# Patient Record
Sex: Male | Born: 1960
Health system: Southern US, Community
[De-identification: ages and names within clinical notes are randomized; demographics above are authoritative.]

## PROBLEM LIST (undated history)

## (undated) DIAGNOSIS — Z91158 Patient's noncompliance with renal dialysis for other reason: Secondary | ICD-10-CM

## (undated) DIAGNOSIS — F419 Anxiety disorder, unspecified: Secondary | ICD-10-CM

## (undated) DIAGNOSIS — Z5189 Encounter for other specified aftercare: Secondary | ICD-10-CM

## (undated) DIAGNOSIS — E119 Type 2 diabetes mellitus without complications: Secondary | ICD-10-CM

## (undated) DIAGNOSIS — N2581 Secondary hyperparathyroidism of renal origin: Secondary | ICD-10-CM

## (undated) DIAGNOSIS — N3941 Urge incontinence: Secondary | ICD-10-CM

## (undated) DIAGNOSIS — M109 Gout, unspecified: Secondary | ICD-10-CM

## (undated) DIAGNOSIS — N189 Chronic kidney disease, unspecified: Secondary | ICD-10-CM

## (undated) DIAGNOSIS — D689 Coagulation defect, unspecified: Secondary | ICD-10-CM

## (undated) DIAGNOSIS — N186 End stage renal disease: Secondary | ICD-10-CM

## (undated) DIAGNOSIS — F329 Major depressive disorder, single episode, unspecified: Secondary | ICD-10-CM

## (undated) DIAGNOSIS — E875 Hyperkalemia: Secondary | ICD-10-CM

## (undated) DIAGNOSIS — T8859XA Other complications of anesthesia, initial encounter: Secondary | ICD-10-CM

## (undated) DIAGNOSIS — T4145XA Adverse effect of unspecified anesthetic, initial encounter: Secondary | ICD-10-CM

## (undated) DIAGNOSIS — K219 Gastro-esophageal reflux disease without esophagitis: Secondary | ICD-10-CM

## (undated) DIAGNOSIS — C679 Malignant neoplasm of bladder, unspecified: Secondary | ICD-10-CM

## (undated) DIAGNOSIS — E785 Hyperlipidemia, unspecified: Secondary | ICD-10-CM

## (undated) DIAGNOSIS — D631 Anemia in chronic kidney disease: Secondary | ICD-10-CM

## (undated) DIAGNOSIS — R350 Frequency of micturition: Secondary | ICD-10-CM

## (undated) DIAGNOSIS — I10 Essential (primary) hypertension: Secondary | ICD-10-CM

## (undated) DIAGNOSIS — Z9115 Patient's noncompliance with renal dialysis: Secondary | ICD-10-CM

## (undated) DIAGNOSIS — D649 Anemia, unspecified: Secondary | ICD-10-CM

## (undated) DIAGNOSIS — F32A Depression, unspecified: Secondary | ICD-10-CM

## (undated) DIAGNOSIS — I5189 Other ill-defined heart diseases: Secondary | ICD-10-CM

## (undated) DIAGNOSIS — R351 Nocturia: Secondary | ICD-10-CM

## (undated) DIAGNOSIS — T7840XA Allergy, unspecified, initial encounter: Secondary | ICD-10-CM

## (undated) DIAGNOSIS — I129 Hypertensive chronic kidney disease with stage 1 through stage 4 chronic kidney disease, or unspecified chronic kidney disease: Secondary | ICD-10-CM

## (undated) HISTORY — DX: Allergy, unspecified, initial encounter: T78.40XA

## (undated) HISTORY — DX: End stage renal disease: N18.6

## (undated) HISTORY — DX: Coagulation defect, unspecified: D68.9

## (undated) HISTORY — DX: Hypertensive chronic kidney disease with stage 1 through stage 4 chronic kidney disease, or unspecified chronic kidney disease: I12.9

## (undated) HISTORY — PX: BACK SURGERY: SHX140

## (undated) HISTORY — PX: COLONOSCOPY: SHX174

## (undated) HISTORY — DX: Encounter for other specified aftercare: Z51.89

## (undated) HISTORY — DX: Secondary hyperparathyroidism of renal origin: N25.81

## (undated) HISTORY — DX: Chronic kidney disease, unspecified: N18.9

## (undated) HISTORY — DX: Hyperlipidemia, unspecified: E78.5

## (undated) HISTORY — DX: Hyperkalemia: E87.5

## (undated) HISTORY — DX: Anemia in chronic kidney disease: D63.1

---

## 1998-03-16 ENCOUNTER — Encounter: Payer: Self-pay | Admitting: Emergency Medicine

## 1998-03-16 ENCOUNTER — Emergency Department (HOSPITAL_COMMUNITY): Admission: EM | Admit: 1998-03-16 | Discharge: 1998-03-16 | Payer: Self-pay | Admitting: Emergency Medicine

## 2002-09-06 ENCOUNTER — Emergency Department (HOSPITAL_COMMUNITY): Admission: EM | Admit: 2002-09-06 | Discharge: 2002-09-06 | Payer: Self-pay | Admitting: Emergency Medicine

## 2002-09-06 ENCOUNTER — Encounter: Payer: Self-pay | Admitting: Emergency Medicine

## 2005-06-02 ENCOUNTER — Ambulatory Visit (HOSPITAL_COMMUNITY): Admission: RE | Admit: 2005-06-02 | Discharge: 2005-06-03 | Payer: Self-pay | Admitting: Neurosurgery

## 2005-06-02 HISTORY — PX: LUMBAR LAMINECTOMY: SHX95

## 2005-09-03 ENCOUNTER — Inpatient Hospital Stay (HOSPITAL_COMMUNITY): Admission: RE | Admit: 2005-09-03 | Discharge: 2005-09-08 | Payer: Self-pay | Admitting: Psychiatry

## 2005-09-09 ENCOUNTER — Other Ambulatory Visit (HOSPITAL_COMMUNITY): Admission: RE | Admit: 2005-09-09 | Discharge: 2005-09-14 | Payer: Self-pay | Admitting: Psychiatry

## 2005-09-09 ENCOUNTER — Ambulatory Visit: Payer: Self-pay | Admitting: Psychiatry

## 2005-10-28 ENCOUNTER — Emergency Department (HOSPITAL_COMMUNITY): Admission: EM | Admit: 2005-10-28 | Discharge: 2005-10-28 | Payer: Self-pay | Admitting: Emergency Medicine

## 2005-11-03 ENCOUNTER — Ambulatory Visit (HOSPITAL_COMMUNITY): Payer: Self-pay | Admitting: Psychiatry

## 2005-11-05 ENCOUNTER — Inpatient Hospital Stay (HOSPITAL_COMMUNITY): Admission: AD | Admit: 2005-11-05 | Discharge: 2005-11-10 | Payer: Self-pay | Admitting: Psychiatry

## 2005-11-05 ENCOUNTER — Ambulatory Visit: Payer: Self-pay | Admitting: Psychiatry

## 2005-11-17 ENCOUNTER — Ambulatory Visit (HOSPITAL_COMMUNITY): Payer: Self-pay | Admitting: Psychiatry

## 2005-12-20 ENCOUNTER — Ambulatory Visit (HOSPITAL_COMMUNITY): Payer: Self-pay | Admitting: Psychiatry

## 2006-03-02 ENCOUNTER — Ambulatory Visit (HOSPITAL_COMMUNITY): Payer: Self-pay | Admitting: Psychiatry

## 2006-05-11 ENCOUNTER — Ambulatory Visit (HOSPITAL_COMMUNITY): Payer: Self-pay | Admitting: Psychiatry

## 2006-11-28 ENCOUNTER — Ambulatory Visit (HOSPITAL_COMMUNITY): Payer: Self-pay | Admitting: Psychiatry

## 2007-03-20 ENCOUNTER — Ambulatory Visit (HOSPITAL_COMMUNITY): Payer: Self-pay | Admitting: Psychiatry

## 2007-04-06 HISTORY — PX: OTHER SURGICAL HISTORY: SHX169

## 2007-08-03 ENCOUNTER — Emergency Department (HOSPITAL_COMMUNITY): Admission: EM | Admit: 2007-08-03 | Discharge: 2007-08-03 | Payer: Self-pay | Admitting: Emergency Medicine

## 2007-08-04 ENCOUNTER — Inpatient Hospital Stay (HOSPITAL_COMMUNITY): Admission: RE | Admit: 2007-08-04 | Discharge: 2007-08-07 | Payer: Self-pay | Admitting: General Surgery

## 2007-08-04 ENCOUNTER — Ambulatory Visit: Payer: Self-pay | Admitting: Surgery

## 2007-10-13 ENCOUNTER — Ambulatory Visit (HOSPITAL_COMMUNITY): Payer: Self-pay | Admitting: Psychiatry

## 2008-02-14 ENCOUNTER — Ambulatory Visit (HOSPITAL_COMMUNITY): Payer: Self-pay | Admitting: Psychiatry

## 2008-02-26 ENCOUNTER — Ambulatory Visit (HOSPITAL_COMMUNITY): Payer: Self-pay | Admitting: Psychiatry

## 2008-04-22 ENCOUNTER — Ambulatory Visit (HOSPITAL_COMMUNITY): Payer: Self-pay | Admitting: Psychiatry

## 2008-12-02 ENCOUNTER — Ambulatory Visit (HOSPITAL_COMMUNITY): Payer: Self-pay | Admitting: Psychiatry

## 2009-10-27 ENCOUNTER — Ambulatory Visit (HOSPITAL_COMMUNITY): Admission: RE | Admit: 2009-10-27 | Discharge: 2009-10-27 | Payer: Self-pay | Admitting: Psychiatry

## 2009-10-28 ENCOUNTER — Ambulatory Visit: Payer: Self-pay | Admitting: Psychiatry

## 2009-10-28 ENCOUNTER — Inpatient Hospital Stay (HOSPITAL_COMMUNITY): Admission: AD | Admit: 2009-10-28 | Discharge: 2009-10-30 | Payer: Self-pay | Admitting: Psychiatry

## 2010-06-20 LAB — URINALYSIS, ROUTINE W REFLEX MICROSCOPIC
Glucose, UA: 250 mg/dL — AB
Leukocytes, UA: NEGATIVE
Nitrite: NEGATIVE
Specific Gravity, Urine: 1.022 (ref 1.005–1.030)
pH: 5.5 (ref 5.0–8.0)

## 2010-06-20 LAB — URINE MICROSCOPIC-ADD ON

## 2010-06-20 LAB — CBC
HCT: 45.9 % (ref 39.0–52.0)
MCHC: 35.2 g/dL (ref 30.0–36.0)
Platelets: 264 10*3/uL (ref 150–400)
RDW: 13 % (ref 11.5–15.5)
WBC: 9.6 10*3/uL (ref 4.0–10.5)

## 2010-06-20 LAB — LIPID PANEL
HDL: 37 mg/dL — ABNORMAL LOW (ref >39)
LDL Cholesterol: 71 mg/dL (ref 0–99)
Triglycerides: 289 mg/dL — ABNORMAL HIGH (ref <150)
VLDL: 58 mg/dL — ABNORMAL HIGH (ref 0–40)

## 2010-06-20 LAB — COMPREHENSIVE METABOLIC PANEL
ALT: 43 U/L (ref 0–53)
Albumin: 4.3 g/dL (ref 3.5–5.2)
Calcium: 10.2 mg/dL (ref 8.4–10.5)
Glucose, Bld: 173 mg/dL — ABNORMAL HIGH (ref 70–99)
Potassium: 3.6 mEq/L (ref 3.5–5.1)
Sodium: 141 mEq/L (ref 135–145)
Total Protein: 7.5 g/dL (ref 6.0–8.3)

## 2010-06-20 LAB — GLUCOSE, RANDOM: Glucose, Bld: 131 mg/dL — ABNORMAL HIGH (ref 70–99)

## 2010-06-20 LAB — URINE DRUGS OF ABUSE SCREEN W ALC, ROUTINE (REF LAB)
Barbiturate Quant, Ur: NEGATIVE
Creatinine,U: 205.2 mg/dL
Ethyl Alcohol: <10 mg/dL (ref <10)
Marijuana Metabolite: NEGATIVE
Methadone: NEGATIVE
Phencyclidine (PCP): NEGATIVE

## 2010-06-20 LAB — TSH: TSH: 3.837 u[IU]/mL (ref 0.350–4.500)

## 2010-08-18 NOTE — Discharge Summary (Signed)
[name] [name], [name] [name]             ACCOUNT NO.:  [id number]   MEDICAL RECORD NO.:  [id number]          PATIENT TYPE:  INP   LOCATION:  3305                         FACILITY:  [facility]   PHYSICIAN:  Dennie Bible, MD    DATE OF BIRTH:  March 13, 1961   DATE OF ADMISSION:  08/04/2007  DATE OF DISCHARGE:  08/07/2007                               DISCHARGE SUMMARY   ADMITTING DIAGNOSIS:  Ischemic  right hand, ischemic small finger, long  finger, and ring finger.   DISCHARGE DIAGNOSIS:  Ischemic right hand, ischemic small finger, long  finger, and ring finger.   HOSPITAL COURSE:  The patient was admitted on Aug 04, 2007, with the  above-mentioned diagnosis, after which he had obtained an arteriogram  showing thrombosis of the ulnar artery.  He was started on IV heparin  therapy with the plan to take him to the operating room on the following  morning on Aug 05, 2007, for exploration of the ulnar artery.  The risks,  benefits and alternatives of surgery were discussed with the patient,  and the patient's family and they agreed and consented to this plan of  care.  Please see the operative report for full details.  In essence,  the patient underwent exploration of the ulnar artery and  revascularization of the right ulnar artery.  Postoperatively, he was  transferred to the intensive unit for frequent neurovascular checks due  to administration of thrombolytics and the nature of his long operation.  His heparin was made therapeutic and he was stable overnight.  On the  first postoperative day, the patient stated that his hand felt better.  There were still some ischemic-appearing fingers: the ring finger tip as  well as the small finger were slightly cooler than the others, however,  subjectively, the patient stated that his hand felt better.  He was  continued on heparin and given aspirin therapy.  On the day of  discharge, the patient's vital signs remained stable including his lab  work.  He  was in no pain.  Examination  of the hand revealed a faint  Doppler signal to all fingers, the ring finger tip appeared to be  somewhat less ischemic.  The wound was not assessed at this time.   DISCHARGE AND PLANNING:  The patient will be educated on the home  Lovenox therapy and will be started on Coumadin for approximately 3  months duration.  He is given a note for work for nonweightbearing and  one handed duty the left hand only.   DISCHARGE MEDICATIONS:  Coumadin.  He is to take 10 mg of Coumadin  tonight, 10 mg of Coumadin tomorrow night and then 5 mg.  His primary  care physician who is Dr. Florina Ou, has been contacted and the  patient will follow up with her for Coumadin therapy.  The patient will  follow up with me in 1 week's time.      Dennie Bible, MD  Electronically Signed     HCC/MEDQ  D:  08/07/2007  T:  08/08/2007  Job:  [id number]

## 2010-08-18 NOTE — Op Note (Signed)
[name] [name], [name] [name]             ACCOUNT NO.:  [id number]   MEDICAL RECORD NO.:  [id number]          PATIENT TYPE:  INP   LOCATION:  3305                         FACILITY:  [facility]   PHYSICIAN:  Theotis Burrow IV, MDDATE OF BIRTH:  Jul 08, 1960   DATE OF PROCEDURE:  08/05/2007  DATE OF DISCHARGE:                               OPERATIVE REPORT   PREOP DIAGNOSIS:  Right hand ischemia.   POSTOP DIAGNOSIS:  Right hand ischemia.   PROCEDURES PERFORMED:  1. Right ulnar artery thrombectomy.  2. Administration of t-PA.   ANESTHESIA:  General.   INDICATIONS:  This is a 50 year old gentleman who suffered ulnar artery  occlusion traumatically during work.  Dr. Lenon Curt has brought the patient  to the operating room for bypass and has performed bypass from his  distal ulnar artery out through a digital vessel with vein.  The bypass  had to be re-done given that there was a poor flow likely due to poor  outflow in the digital arteries.  He has asked me to come in and assist.  Please see his note for the full details of the procedure.  The  following is the dictation of aspect of the procedure.   Upon my arrival, the patient's distal ulnar artery and a digital artery  were already exposed.  There was an interposition vein graft already  present.  This was evaluated with the Doppler, there did not appear to  be any flow within the bypass graft.  At this point, the patient  previously undergone an arteriogram and this arteriotomy had been closed  with Prolene suture.  This was removed, #2 Fogarty was passed retrograde  and thrombus was evacuated.  With evacuation of thrombus there was  excellent flow through the ulnar artery.  At this point, the patient was  systemically heparinized.  The ulnar artery was re-occluded.  We then  passed a #2 Fogarty across both anastomoses of the interposition graft  and performed thrombectomy, however, no thrombus was evacuated.  Through  this arteriotomy, I  elected to inject 2 mg of t-PA.  The artery was then  occluded and the arteriotomy was repaired with a 7-0 U-stitch.  Once  this was repaired, the vascular clamps were removed off the ulnar  artery.  We were able to hear good Doppler signal distal to the distal-  anastomosis.  At this time based on what had already happened in his  operation and the fact that we now had flow within the digital arteries,  so I did not feel further surgery would be of benefit.           ______________________________  V. Leia Alf, MD  Electronically Signed     VWB/MEDQ  D:  08/06/2007  T:  08/06/2007  Job:  [id number]

## 2010-08-18 NOTE — Consult Note (Signed)
[name] [name], [name] [name]             ACCOUNT NO.:  [id number]   MEDICAL RECORD NO.:  [id number]          PATIENT TYPE:  INP   LOCATION:  3305                         FACILITY:  [facility]   PHYSICIAN:  Theotis Burrow IV, MDDATE OF BIRTH:  [birth year]   DATE OF CONSULTATION:  DATE OF DISCHARGE:                                 CONSULTATION   CONSULTING PHYSICIAN:  Dennie Bible, MD.   REASON FOR CONSULT:  Right hand ischemia.   HISTORY:  This is a 50 year old gentleman seen at the request of  Dr.  Lenon Curt for evaluation of right hand ischemia.  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  He used his hand to shut off the machine and  shortly thereafter began having numbness and pain in his hand.  He was  seen earlier in the office by Dr. Lenon Curt and at that time was complaining  of numbness, particularly at the distal tip of his fourth finger.  He  also had sensation changes on his third and fifth finger.  He was able  to move his hand.  He was seen in Dr. Brennan Bailey clinic and sent for an  arteriogram.  Arteriogram reveals occlusion of his ulnar artery at the  wrist.  The patient has an incomplete palmar arch with minimal filling  of the digital vessels going to his third, fourth, and fifth fingers.   PAST MEDICAL HISTORY:  Significant for depression and back surgery.   MEDICATIONS:  Include Celexa.   ALLERGIES:  None.   FAMILY HISTORY:  Noncontributory.   REVIEW OF SYSTEMS:  Negative for chest pain or shortness of breath.  Negative for fevers, chills, nausea, or vomiting.  No weight gain or  weight loss.   PHYSICAL EXAMINATION:  VITAL SIGNS:  The patient is afebrile,  hemodynamically stable.  GENERAL:  He is well appearing in no acute distress.  Normocephalic,  atraumatic.  CARDIOVASCULAR:  Regular rate.  LUNGS:  Respirations are nonlabored.  EXTREMITIES:  Exam of the right hand reveals palpable radial pulses.   No  pulse palpable in the distal ulnar artery.  The patient has motor  function of all five digits.  Sensation is diminished on the lateral  three fingers, and there is a bluish discoloration at the tip of his  fourth finger.   ASSESSMENT:  This is a 50 year old with a traumatic injury to the right  ulnar artery and ischemic changes due to occlusion.   PLAN:  Based on the patient's arteriogram, I feel that he has a very  poor outflow and that surgical intervention does not have a high  likelihood of success.  The patient would need bypass from the digital  ulnar artery out onto the digital artery in the hand assuming that we  could improve the outflow to the digital arteries.  At this point, given  that the patient is several days out from his injury, I would recommend  a trial of heparinization to see how the patient  does overnight with reevaluation in the morning.  I  explained to the  patient that he is potentially at risk for losing his fingers.  I  discussed this with the patient and his wife, he understands this.  We  will continue to see the patient in consultation.           ______________________________  V. Leia Alf, MD  Electronically Signed     VWB/MEDQ  D:  08/06/2007  T:  08/06/2007  Job:  [id number]

## 2010-08-18 NOTE — Op Note (Signed)
NAMEDEVRY, SEGAR             ACCOUNT NO.:  [id number]   MEDICAL RECORD NO.:  [id number]          PATIENT TYPE:  INP   LOCATION:  3305                         FACILITY:  [facility]   PHYSICIAN:  Dennie Bible, MD    DATE OF BIRTH:  [birth year]   DATE OF PROCEDURE:  08/05/2007  DATE OF DISCHARGE:  08/07/2007                               OPERATIVE REPORT   PREOPERATIVE DIAGNOSIS:  Ischemic right hand, right small finger, right  ring finger, and right middle finger.   POSTOPERATIVE DIAGNOSIS:  Ischemic right hand, right small finger, right  ring finger, and right middle finger.   PROCEDURES:  1. Exploration of the ulnar artery and the superficial palmar arch of      the right hand.  2. Thrombectomy of the right ulnar artery and superficial palmar arch.  3. Interposition reversed vein graft from the ulnar artery to the      palmar arch.  4. Intraoperative arteriogram.  5. Thrombolysis with tPA.  6. Complex wound closure.   SURGEON:  Harrill C. Lenon Curt, M.D.   INTRAOPERATIVE CONSULT:  Theotis Burrow IV, MD.   ANESTHESIA:  General.   FINDINGS:  Thickened-diseased ulnar artery in the palm, positive clot.  Good doppler signal distally post procedure.   SPECIMENS:  Portion of the artery that was resected was sent to  pathology for identification.   ESTIMATED BLOOD LOSS:  25 mL.   COMPLICATIONS:  Poor outflow after revascularization.   DISPOSITION:  To PACU in good condition.   INDICATIONS:  Mr. Kool is a 50 year old gentleman who states that on  Wednesday, 3 days prior to the surgery, at work the patient used the  palm of his hand to manipulate a machine.  Afterwards, his hand and his  fingers became painful and discolored.  The patient was seen briefly in  the emergency department on Thursday night and the patient saw me in the  office Friday morning, at which time I felt that he had ischemic small,  ring and long fingers.  The patient was sent for an arteriogram,  which  revealed thrombosis of the ulnar artery and poor-defined palmar arch  with essentially minimal flow to the long, ring and small fingers.  The  patient was counseled on these findings and he was placed on therapeutic  heparin overnight and scheduled for ulnar artery exploration in the  morning.  On heparin therapy, the patients symptoms improved.  The  risks, benefits, and alternatives of the surgery were discussed with the  patient including loss of the fingers.  He agreed to proceed with  surgery.  Consent was obtained.   PROCEDURE:  The patient was taken to the operating room.  General  anesthesia was administered.  The right upper extremity was prepped and  draped in normal sterile fashion.  The tourniquet was used on the upper  arm, an Esmarch was used from the wrist proximally and not on the hand.  Tourniquet was inflated to 250 mmHg.  An incision was made overlying the  proximal ulnar artery and a  zigzag incision was fashioned over the  wrist  crease and onto the hypothenar eminence.  The ulnar artery was  located proximally and followed distally until the initial portions of  the superficial palmar arch and the common digital branches were  visualized.  It was evident that the artery appeared to be abnormal and  thickened in the mid-palm portion.  Vessel loops were placed around the  proximal and distal artery, the artery was isolated.  Following, a stab  incision was made in the artery and a 2-mm Fogarty balloon was passed  proximally and then distally obtaining a small portion of clot.  Inflow  was then accessed and it was very good.  A clamp was placed over the  ulnar proximal artery.  Distally, there was also very good backflow.  The caliber of the common digital vessels were such that the Fogarty  balloon could not be passed.  The 68mm Fogarty catheter was advanced  approximately 3 cm distal to the diseased segment and well into the  superficial palmar arch.  Vascular  clamps were placed and the diseased  portion of the ulnar artery was resected (~3cm).  Both new ends appeared  to be in satisfactorily nondiseased state.  There were no major branches  in the resected portion.  The resection continued distally up to what I  believe was the common digital branch to the small and ring fingers.  This branch was preserved.  Both vessels were irrigated with Stephens Shire  solution and vascular clamps placed.  The hand was then turned and an  approximate 3-cm incision was made on the radial side of the wrist to  locate at appropriate sized vein for reanastomosis.  Dissection down to  the subcutaneous tissue was performed.  The superficial radial branch  nerve was identified and preserved.  A branch of what appeared to be  cephalic vein was encountered and an appropriate length portion was  isolated, tied proximally with a 3-0 silk suture.  Individual branches  were also tied with 3-0 silk sutures and distally the vein was tied off  with 3-0 silk suture.  The vein was marked for proper orientation.  Following, the hand was repositioned and the vein was anastomosed  connecting the 2 segments of ulnar artery.  This vein was in a reversed  orientation and was anastomosed in an interrupted fashion with 8-0 nylon  sutures.  Afterwards, the distal clamp was released to allow some  backbleeding followed by proximal release of the clamp.  The vessel at  first showed good flow and there was a nice pulsatile flow through the  graft and distally.  There was 1 small area on the proximal graft that  required an additional stitch.  The graft was observed for several  minutes.  Next, an intraoperative arteriogram was performed with a  butterfly needle.  The proximal ulnar artery was accessed and then the  arteriogram was performed.  The arteriogram revealed nonfilling of the  graft.  Afterwards, the graft was taken down, the graft was confirmed  again to be reversed  and it was  reanastomosed both proximally and  distally in a interrupted fashion with the 8-0 nylon sutures.  Again,  there was nice pulsatile and visible pulsation of the graft.  Intraoperative Doppler of the graft revealed almost a water-hammer type  single.  At this point, the decision was made to consult Dr. Trula Slade  for his opinions for additional reconstruction or additional therapy.  Dr. Trula Slade came to the room and an additional thrombectomy was  performed.  There was a small amount of clot proximally.  No clot was  obtained distally.  2 mg of diluted tPA was placed distally to try to  improve out flow.  At this time, it was thought that we had good inflow  and poor outflow due to thrombosis of some of the small-caliber common  digital vessels.  Doppler signal revealed a good strong signal  proximally; and at the proximal portion of the palmar arch, distal to  the vein graft, there was good triphasic flow.  Hemostasis was then  obtained.  A small piece of a Surgicel was placed adjacent to the  proximal anastomosis.  The wound was then irrigated thoroughly with  irrigation solution.  Because of the length of procedure and swelling  involved, some of the skin underlying the incision was manipulated to  cover the bypass without undue tension.  This was closed in a Z-plasty  type fashion in 2 layers.  The first layer being several interrupted 4-0  Vicryl sutures followed by both horizontal mattress and interrupted 5-0  nylon sutures.  A sterile dressing and a volar splint were placed for  comfort.  At the conclusion of the  case, the distal portion of the ring finger remained ischemic, as it was  prior to the procedure.  There was a Doppler signal through at least 1  of the digital vessels to the long finger and the small finger.  The  patient will be placed on IV heparin therapy and monitored in the unit  for neurovascular checks.      Dennie Bible, MD  Electronically Signed      HCC/MEDQ  D:  08/07/2007  T:  08/08/2007  Job:  [id number]

## 2010-08-21 NOTE — H&P (Signed)
[name] [name], [name] [name]             ACCOUNT NO.:  [id number]   MEDICAL RECORD NO.:  [id number]          PATIENT TYPE:  IPS   LOCATION:  0307                          FACILITY:  [facility]   PHYSICIAN:  Carlton Adam, M.D.      DATE OF BIRTH:  30-May-1960   DATE OF ADMISSION:  11/05/2005  DATE OF DISCHARGE:                         PSYCHIATRIC ADMISSION ASSESSMENT   The patient reports that he took an overdose of Ambien and lorazepam and  went into the woods to die. Apparently his wife called EMS that an ED visit  was needed but the wife brought the patient here as a voluntary admission.  The patient is mad at his wife and is not listing her for visits. He  presented with a flat affect, his mood was depressed. He was unable to  verbalize why or what triggered the overdose. According to hospital records,  he was cleared in the emergency department on July 26th.  He supposedly had  overdosed then. He had a urine drug screen that was negative, and he had a  CT scan of the head that was negative.  He has one prior admission here,  June 1st to June 6th, 2007 and apparently he claims that all of his symptoms  began after he had back surgery.  His back surgery was February 28th and  since that time he has reported feeling depressed, having occasional  thoughts of wanting to hurt himself. He is complaining of losing weight. In  the system he was weighed here at 187, today is 176 so he does not have a  remarkable weight loss.   Today he states that he took an overdose yesterday around 8 a.m., I took  the rest of my sleeping pills.  He was unable to specify why. He went into  the woods.  His wife called the police who searched with dogs. They then  took him to go for [facility]. EMS evaluated him on the scene. No  ER visit was needed, and in fact, [facility] sent the  patient home.  The patient then presented here for further adjustments of  his medications.   SOCIAL HISTORY:   He had some classes at [employer]. 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 This is his second marriage. He  has been in this marriage 70 years. He has three daughters ages 45, 45 and  54.   FAMILY HISTORY:  None.   ALCOHOL AND DRUGS HISTORY:  None.   PRIMARY CARE PHYSICIAN:  Dr. Florina Ou.   MEDICAL PROBLEMS:  He is status post back surgery on June 02, 2005.   MEDICATIONS:  1.  He is currently prescribed Wellbutrin 100 mg b.i.d.  2.  Lorazepam 0.5 mg p.r.n.  3.  Celexa 10 mg p.o. daily.  4.  Ambien 10 mg at h.s.  5.  Testosterone.  6.  AndroGel.  7.  He started he was also recently given a sleep aid sample that started      with an R, must be Rozerem.  ALLERGIES:  No known drug allergies.   PHYSICAL EXAMINATION:  Unremarkable.   LABS:  His labs are pending. He did have a CT scan of the head October 28, 2005  that was negative.   I administered a new mini mental status exam to him today that is called the  SLUMS and he came up with dementia to neuro cognitive disorder as his score.  It is hard to know if he just has severe depression or something else going  on. He is very odd in his presentation.   MENTAL STATUS EXAM:  He is alert. He is casually groomed and dressed. He is  adequately nourished. His speech is slow, soft, his mood is depressed, his  affect is flat. His thought processes are not clear or rational. Judgment  and insight are poor. Concentration is okay but his memory is spotty. He  denies being suicidal or homicidal. He denies having auditory or visual  hallucinations.  He states, I lost my confidence, I am always checking  myself, everything is racing through my mind when I try to go to bed at  night.   Axis I:  Major depressive disorder, single episode, severe without  psychosis.  Axis II:  Deferred.  Axis III:  No known medical illnesses.  Axis IV:  Mild occupational, marital and stressors.  Axis V:  Is 30.   PLAN:   Admit for safety and stabilization.  Adjust his medications toward that end, let us try giving him some Zyprexa,  Zydis to help with sleep and decrease racing thoughts and help him not want  to be checking all the time.      Mickie Kerry Dory, P.A.-C.      Carlton Adam, M.D.     MD/MEDQ  D:  11/06/2005  T:  11/06/2005  Job:  [id number]

## 2010-08-21 NOTE — Discharge Summary (Signed)
NAMESEARLE, CASTORINA NO.:  [id number]   MEDICAL RECORD NO.:  [id number]          PATIENT TYPE:  IPS   LOCATION:  0307                          FACILITY:  [facility]   PHYSICIAN:  Carlton Adam, M.D.      DATE OF BIRTH:  June 01, 1960   DATE OF ADMISSION:  11/05/2005  DATE OF DISCHARGE:  11/10/2005                                 DISCHARGE SUMMARY   CHIEF COMPLAINT AND PRESENT ILLNESS:  This was the first admission to [facility] for this 50 year old male who reportedly overdosed on  Ambien and lorazepam and went into the woods to die.  His wife called EMS.  The patient was mad at his wife, not listing her for visits.  Presented with  a flat affect, depressed mood, unable to verbalize why or what triggered the  overdose.  He was cleared in the emergency department on October 28, 2005.  At  that time, UDS was negative for substances of abuse.  CT scan was negative.   PAST PSYCHIATRIC HISTORY:  He was admitted September 03, 2005 to September 08, 2005.  He  claimed his symptoms started after he had back surgery.  Since then, feeling  depressed, occasional thoughts of wanting to hurt himself.   ALCOHOL/DRUG HISTORY:  Denies active use of any substances.   MEDICAL HISTORY:  Noncontributory except status post back surgery June 02, 2005.   MEDICATIONS:  Wellbutrin 100 mg twice a day, Ativan 0.5 mg as needed, Celexa  10 mg per day, Ambien 10 mg at night, testosterone AndroGel.  More recently  was given Rozerem for sleep.   PHYSICAL EXAMINATION:  Performed and failed to show any acute findings.   LABORATORY DATA:  Not available in the chart.   MENTAL STATUS EXAM:  Alert male.  Casually groomed and dressed.  Speech was  slow, soft.  His mood was depressed.  Affect was constricted.  Thought  processes are not clear or rational.  Judgment and insight are poor.  Concentration is preserved but memories spotty.  Denied any suicidal or  homicidal ideation.  No evidence of  delusions.  There is no evidence of  hallucinations.  Cognition was well-preserved.   ADMISSION DIAGNOSES:  AXIS I:  Major depressive disorder.  AXIS II:  No diagnosis.  AXIS III:  No diagnosis.  AXIS IV:  Moderate.  AXIS V:  GAF upon admission 30; highest GAF in the last year 51.   HOSPITAL COURSE:  He was admitted.  He was started in individual and group  psychotherapy.  He was given Ativan 1 mg as needed for anxiety.  Then, he  was switched to Klonopin and he was maintained on Celexa that was increased  up to 30 mg.  He endorsed that he guessed his medications were not working  well.  Had an appointment with Dr. Adele Schilder that had been rescheduled.  He  took 6 Ambien and his antidepressant.  Went into the woods to die.  Depressed for the past several weeks.  Sleep disturbance, decreased  appetite, losing 40 pounds in three months.  There was  a family session.  He  was very depressed, barely spoke.  We continued to work with the  medications.  He endorsed psychomotor retardation, anhedonia, feeling slowed  down, poor concentration at work which was impairing his performance.  Episodes of amnesia after taking his Ambien.  He was started on Zyprexa.  He  slept.  The next morning, he seemed to have a little brighter affect,  feeling less anxious.  He endorsed that he did continue to feel very  depressed, no energy, no motivation.  He endorsed that he was still dealing  with the accident where his brother died.  He confessed to his wife what  happened and she in turn shared it with his mother and he got upset because  of that.  Did not want the mother to know but he was able to deal with these  feelings.  Endorsed that he could not explain why he was feeling this way.  The Zyprexa helped him to sleep and also seemed to help with the anxiety.  He endorsed that he worries a lot about his job.  Wife stated that he is  perfectionistic, constantly worrying.  On November 09, 2005, he was sleeping   better, decrease in the ruminations and the worries.  Objectively, his  affect was broader.  We pursued the Celexa, the Zyprexa, coping skills,  grief and loss.  On November 10, 2005, he was objectively better.  Sleeping  through the night.  He was of brighter affect.  Endorsed no suicidal  ideation.  No homicidal ideation.  No hallucinations.  No delusions.  Endorsed that he was feeling the best he has felt in a long time.   DISCHARGE DIAGNOSES:  AXIS I:  Major depression, recurrent.  Anxiety  disorder not otherwise specified.  AXIS II:  No diagnosis.  AXIS III:  Status post back surgery.  AXIS IV:  Moderate.  AXIS V:  GAF upon discharge 55-60.   DISCHARGE MEDICATIONS:  1. Zyprexa Zydis 10 mg at bedtime.  2. Klonopin 0.5 mg twice a day.  3. Celexa 20 mg, 1-1/2 daily.   FOLLOWUP:  Dr. Adele Schilder and [facility].      Carlton Adam, M.D.  Electronically Signed     IL/MEDQ  D:  11/23/2005  T:  11/24/2005  Job:  [id number]

## 2010-08-21 NOTE — Discharge Summary (Signed)
NAMEODINN, BEVIER             ACCOUNT NO.:  [id number]   MEDICAL RECORD NO.:  [id number]          PATIENT TYPE:  IPS   LOCATION:  0304                          [facility]ACILITY[facility]  [facility]   PHYSICIAN:  Carlton Adam, M.D.      DATE OF BIRTH:  1960-07-18   DATE OF ADMISSION:  09/03/2005  DATE OF DISCHARGE:  09/08/2005                                 DISCHARGE SUMMARY   CHIEF COMPLAINT AND PRESENTING ILLNESS:  This was the first admission to  [facility]  for this 50 year old married white male,  presented as a walk-in, feeling that everything was closing in on him,  having a hard time focusing.  Had been feeling unwell after his back surgery  June 02, 2005.  Had been losing weight as he has been able to work until  the last week.  He did have some thoughts of hurting himself by taking some  pills.  After he realized that he was getting depressed, he called the  surgeon who started Cymbalta.  He took that for 3 or 4 days.  He felt it was  too strong.  His primary care physician started him on Wellbutrin.  He took  7.  He said it woke him up but then he could not sleep at night.  Then he  was switched to Celexa 10 every morning.  He felt sedated, worried about his  job.  Had been employed there for 15 years despite having had back surgery.   PAST PSYCHIATRIC HISTORY:  No prior inpatient or outpatient treatment.  Has  been prescribed by his primary care physician.   ALCOHOL AND DRUG HISTORY:  Denies the active use of any substances.   PAST MEDICAL HISTORY:  Hyperlipidemia, decreased testosterone, back surgery  June 02, 2005/   MEDICATIONS:  Celexa 10 mg per day, testosterone AndroGel that he applies  daily.   PHYSICAL EXAMINATION:  Performed, failed to show any acute findings.   LABORATORY WORKUP:  CBC:  White blood cells 10.8, hemoglobin 15.9.  Blood  chemistries:  Sodium 140, potassium 3.7, glucose 87, creatinine 1.3, BUN 15.  Liver enzymes:  SGOT 17, SGPT 20.  TSH  2.892.  PSH .43.   MENTAL STATUS EXAM:  Reveals an alert cooperative male, appropriately  groomed, dressed and nourished.  Speech was a little bit slower.  Mood was  depressed, affect was decreased in range.  Thought processes were clear,  rational and goal oriented.  Judgment and insight were intact.  Denied any  active suicidal or homicidal ideas.   ADMISSION DIAGNOSES:  AXIS I:  Major depression, recurrent.  AXIS II:  No diagnosis.  AXIS III:  Urinary tract infection.  AXIS IV:  Moderate.  AXIS V:  Upon admission 30, highest global assessment of functioning in the  last year 63-70.   COURSE IN HOSPITAL:  He was admitted and started in individual and group  psychotherapy, was given Ambien for sleep, was maintained on Celexa that was  changed to 20 mg per day and he was further evaluated for a urinary tract  infection.  He was started  on Wellbutrin SL 100 in the morning.  He felt  upset because he could not do anything secondary to the pain.  Wellbutrin  and Cymbalta were helpful but not it long enough.  Feeling sedated on the  Celexa.  Still some suicidal ideations.  Withdrawn and isolated.  The  patient's wife shared that the patient's father died in an accident when he  was 59 years old and the patient's younger brother died in a tractor accident  when the patient was 72.  He recently expressed guilt to his wife about his  brother's death but would not elaborate.  The wife endorsed that in the last  18 years they have been married he has never talked about this.  Wife was  able to share how she has seen a big change with a lot of isolation and  anhedonia.  He did endorse a lot of stress after surgery, chronic pain, a  sense of being overwhelmed, sadness, decrease in energy, decreased  motivation, decreased concentration, thinking he stays out of work.  Good  worker all these years.  Continued to discuss options.  He was willing to  give Wellbutrin another try.  Continued to endorse  the depression, a sense  of being overwhelmed.  He became upset when his brother-in-law saw him in  the unit.  He did not want to be seen like that.  Up until that point he  said he was feeling a little better.  In the next 24 hours he continued to  work with himself.  He was open in group and individual sessions.  He wanted  to be discharged.  He felt the way he was feeling he would recover faster by  being at home.  So on June 6 he was in full contact with reality, endorsed  no active suicidal or homicidal ideas.  There was no evidence of  hallucinations or delusions.  Endorsed he was feeling much better.  Objectively he looked better, looking forward to getting out of the  hospital, being back home.  Endorsed support from the family, was willing to  come to IOP.   DISCHARGE DIAGNOSES:  AXIS I:  Major depression, recurrent, rule out post-  traumatic stress disorder.  AXIS II:  No diagnosis.  AXIS III:  Status post back surgery.  AXIS IV:  Moderate.  AXIS V:  Upon discharge 50-55.   DISCHARGE MEDICATIONS:  1.  Testosterone AndroGel cream, apply daily.  2.  Celexa 20 mg per day.  3.  Wellbutrin SL 100 mg twice a day.  4.  Ativan 0.5 twice a day as needed for anxiety.  5.  Ambien 10 at bedtime for sleep.   DISPOSITION:  Follow up IOP and Dr. Adele Schilder at [facility]  outpatient clinic.      Carlton Adam, M.D.  Electronically Signed     IL/MEDQ  D:  09/22/2005  T:  09/23/2005  Job:  [id number]

## 2010-08-21 NOTE — H&P (Signed)
[name] [name], [name] [name]             ACCOUNT NO.:  [id number]   MEDICAL RECORD NO.:  [id number]          PATIENT TYPE:  IPS   LOCATION:  0304                          FACILITY:  [facility]   PHYSICIAN:  [name] [name], M.D.      DATE OF BIRTH:  04-Jul-1960   DATE OF ADMISSION:  09/03/2005  DATE OF DISCHARGE:                         PSYCHIATRIC ADMISSION ASSESSMENT   This is a voluntary admission to the services of Dr. Carlton [name].   IDENTIFYING INFORMATION:  This is a 49 year old married white man.  He  presented as a walk-in yesterday.  He stated, It is seems like everything  is closing in on me.  I'm having a hard time focusing.  He reports that he  began feeling unwell after his back surgery, which was June 02, 2005.  Recently, he has been losing weight, and he has not been able to work this  past week.  Apparently, he did have some thoughts to hurt himself by taking  some pills.  He reports that after he realized he was getting depressed, he  called the surgeon who started Cymbalta.  He states that he took that for  three or four days, however, It was too strong for him.  He felt way out  there.  His primary care physician, Dr. Florina Ou, started him on  Wellbutrin.  He had a 7-day pack, 150 mg, which he was taking in the  mornings.  He said that woke him up; he had been sleeping too much.  However, then he could not sleep at night, so he only took that for seven  days.  He states that this past Tuesday, he began taking Celexa 10 mg p.o.  every morning, and that when he is sitting around, he nods off.  He also  states that he is worried about his job.  He has been employed there for 15  years, and despite having had back surgery, he is still having occasional  back pain.   PAST PSYCHIATRIC HISTORY:  He has no prior inpatient or outpatient care.  In  1984 or 1985, he saw a marriage counselor at the [place] of his first  marriage.   SOCIAL HISTORY:  He has an associate's degree.  He  works in Immunologist.  He  has been married twice, this time for 18 years.  He has three daughters, the  oldest is 77 and the younger ones are 25 and 55.   FAMILY HISTORY:  He denies.   ALCOHOL AND DRUG HISTORY:  He denies.   PRIMARY CARE [name]:  Dr. Florina Ou.   MEDICAL PROBLEMS:  Hyperlipidemia and decreased testosterone.  He has also  recently had back surgery, June 02, 2005.   CURRENT MEDICATIONS:  __________  cream that he applies daily, and the  Celexa 10 mg p.o. daily.   DRUG ALLERGIES:  No known drug allergies.   PHYSICAL EXAMINATION:  Physical exam is unremarkable.  He does have a lumbar  scar.  His vital signs show he is 5 feet 10 inches, weighs 187.  Temperature  is 97.3.  Blood pressure is 130/84.  Pulse is 60.  Respirations are 16.   His blood work shows that his urine is cloudy.  He had 7-10 WBCs.  He had a  moderate amount of leukocytes, and he had a large amount of blood in his  urine.  We will get that checked and get some medication started for that.   MENTAL STATUS EXAMINATION:  He is alert and oriented.  He is appropriately  groomed, dressed, and nourished.  His gait and motor are a little bit  slower.  His speech is a little bit slower.  His mood is depressed.  His  affect has a decreased range.  His thought process is clear, rational, and  goal-oriented.  His judgment and insight are intact.  Concentration and  memory are intact.  Today, he denies suicidal or homicidal ideation.  He  says, I have a family, and he denies ever having had auditory or visual  hallucinations.   DIAGNOSES:  AXIS I:  Major depressive disorder, recurrent, without psychotic  symptoms.  AXIS II:  Deferred.  AXIS III:  Urinary tract infection.  AXIS IV:  Severe.  AXIS V:  30.   PLAN:  Adjust his meds as indicated.  Toward that end, we will move the  Celexa to p.m. dosing and see if that does not help with his daytime  sleepiness, and we will treat his UTI.       Mickie Kerry Dory, P.A.-C.      [name] [name], M.D.  Electronically Signed    MD/MEDQ  D:  09/04/2005  T:  09/05/2005  Job:  [id number]

## 2010-08-21 NOTE — Op Note (Signed)
[name] [name], [name] [name]             ACCOUNT NO.:  [id number]   MEDICAL RECORD NO.:  [id number]          PATIENT TYPE:  AMB   LOCATION:  SDS                          FACILITY:  [facility]   PHYSICIAN:  Kary Kos, M.D.        DATE OF BIRTH:  May 06, 1960   DATE OF PROCEDURE:  DATE OF DISCHARGE:                                 OPERATIVE REPORT   PREOPERATIVE DIAGNOSIS:  L4 and L5 radiculopathy from lumbar spinal stenosis  at L4-L5.   POSTOPERATIVE DIAGNOSIS:  L4-L5 lumbar spinal stenosis with pars defects at  L4, severe foraminal compression of the L4 and L5 nerve roots.   PROCEDURE:  Decompressive lumbar laminectomy at L4-L5 with microscopic  resection of the L4 and the L5 nerve roots.   ASSISTANT:  Eustace Moore, MD   ANESTHESIA:  General endotracheal.   PROCEDURE:  The patient is a very pleasant 50 year old gentleman who has had  long-standing left hip and leg pain radiating down the anterior margin of  his shin consistent with an L4 radiculopathy. The patient failed all forms  of conservative treatment. __________ shows severe lumbar stenosis with 2 to  3 mm Grade I slip at L5. The patient was extensively counseled and  recommended decompression and fusion, however, the patient did not want  lumbar spinal fusion, wanted to treat predominately the radicular pain.  After extensive counseling to the risks of exacerbating the instability with  any type of decompressive procedure, the patient still did not want to  proceed with the fusion initially after failing all forms of conservative  treatment, we discussed the risks and benefits of just doing a  decompression. That is all he wanted to proceed with, so that is what we  consented for with an L4-L5 decompression. The risks and benefits were  explained. The patient __________ bony landmarks, the L4-L5 disc space was  identified and after anesthetized with lidocaine a midline incision was  made. Bovie electrocautery was used to take down  subcutaneous tissues.  Subperiosteal dissection was carried out to the lamina.  __________.  The L4-  L5 confirmed by intraoperative x-ray. The L3-L4 spinous process, lamina and  facet complex was noted to be markedly hypertrophied with large lip coming  off the spinous process of L3 and this helped identify the correct level. L4-  L5 was noted to be severely stenotic. Using a 3 mm Kerrison punch, the  inferior aspect of the L4 medial facet complex and the superior aspect of  the L5 was removed, identifying ligamentum flavum which was noted to be  markedly hypertrophied. This was dissected off the thecal sac with 4  Penfield and then removed in a piecemeal fashion with a 3 mm Kerrison  punches. There was noted to be marked facet arthropathy in the superior  aspect of the L4-L5 facet and was causing severe compression of the thecal  sac and over the top of the L4 nerve root. This was dissected away with a 4  Penfield and with a 2 mm Kerrison punches. At this point the operating  microscope was brought into the field.  The laminotomy was extended cephalad  to get underneath this lip of the superior facet. There was noted to be a  pars defect at L4. This was dissected and extensive fibrous granulation  tissue was noted to be underneath this aspect of the facet complex pressing  on the 4 root and this was freed up with a 4 Penfield and the nerve root  removed piecemeal fashion with 2 medium Kerrison punch. The L4 nerve root  was then subsequently identified. It was noted to be markedly stenotic and  so the foramen was extended out. The facet was noted to be severely  hypermobile and this was all taken care to preserve some of the lateral  facet complex, but insuring adequate decompression of the 4 root. This was  extended inferiorly undersurface of the medial facet. The L5 nerve root was  decompressed and partially unroofed. The disc was palpated. There was noted  to be a disc bulge, but this  was felt to be more a pseudo disc than it was  frank herniation, so it was left alone to help preserve patient's stability.  At the superior aspect laminotomy defect, there was a small dural  diverticulum or a split-thickness tear. This was not leaking CSF, however,  it was packed away with a 1 x 1 piece of Duragen to seal. Meticulous  hemostasis was maintained prior to the seal and Duragen placement. The wound  was copiously irrigated. Gelfoam was put on top of the seal subsequently and  the muscle and fascia reapproximated in layers with interrupted Vicryl and  the skin was closed with 4-0 subcuticular. Steri Strips were applied. The  patient went to the recovery room in stable condition.           ______________________________  Kary Kos, M.D.     GC/MEDQ  D:  06/02/2005  T:  06/02/2005  Job:  [id number]

## 2011-04-06 DIAGNOSIS — C679 Malignant neoplasm of bladder, unspecified: Secondary | ICD-10-CM

## 2011-04-06 HISTORY — DX: Malignant neoplasm of bladder, unspecified: C67.9

## 2011-11-19 ENCOUNTER — Other Ambulatory Visit: Payer: Self-pay | Admitting: Urology

## 2011-12-06 ENCOUNTER — Ambulatory Visit (INDEPENDENT_AMBULATORY_CARE_PROVIDER_SITE_OTHER): Payer: BC Managed Care – PPO | Admitting: Emergency Medicine

## 2011-12-06 VITALS — BP 154/86 | HR 92 | Temp 98.5°F | Resp 16

## 2011-12-06 DIAGNOSIS — M109 Gout, unspecified: Secondary | ICD-10-CM

## 2011-12-06 MED ORDER — INDOMETHACIN 50 MG PO CAPS: 50.0000 mg | ORAL_CAPSULE | Freq: Two times a day (BID) | ORAL | Status: DC

## 2011-12-06 MED ORDER — COLCHICINE 0.6 MG PO TABS: 0.6000 mg | ORAL_TABLET | Freq: Every day | ORAL | Status: DC

## 2011-12-06 NOTE — Progress Notes (Signed)
  Date:  12/06/2011   Name:  [name] [name]   DOB:  February 06, 1961   MRN:  [id number] Gender: male Age: 51 y.o.  PCP:  Florina Ou, MD    Chief Complaint: Pain   History of Present Illness:  [name] [name] is a 51 y.o. pleasant patient who presents with the following:  History of gout not on any medication.  Last episode years ago.  Now has pain and swelling in left great toe MTP joint now involving right foot as well.  Usually has episodes involving the knee.  No fever or chills, antecedent illness or injury.  Pending a TURB bladder tumor this month.  Evaluated for hematuria.  There is no problem list on file for this patient.   No past medical history on file.  No past surgical history on file.  History  Substance Use Topics  . Smoking status: Former Research scientist (life sciences)  . Smokeless tobacco: Not on file  . Alcohol Use: Not on file    No family history on file.  No Known Allergies  Medication list has been reviewed and updated.  Current Outpatient Prescriptions on File Prior to Visit  Medication Sig Dispense Refill  . citalopram (CELEXA) 40 MG tablet Take 40 mg by mouth daily.      . metFORMIN (GLUCOPHAGE) 500 MG tablet Take 500 mg by mouth 2 (two) times daily with a meal.        Review of Systems:  As per HPI, otherwise negative.    Physical Examination: Filed Vitals:   12/06/11 1119  BP: 154/86  Pulse: 92  Temp: 98.5 F (36.9 C)  Resp: 16   There were no vitals filed for this visit. There is no height or weight on file to calculate BMI. Ideal Body Weight:     GEN: WDWN, NAD, Non-toxic, Alert & Oriented x 3 HEENT: Atraumatic, Normocephalic.  Ears and Nose: No external deformity. EXTR: No clubbing/cyanosis/edema NEURO: Normal gait.  PSYCH: Normally interactive. Conversant. Not depressed or anxious appearing.  Calm demeanor.  Left foot:  Painful, tender, swollen, erythematous great toe MTP joint.  Guards flexion and extension of toe.  No cellulitis or  lymphangitis  Assessment and Plan: Gout Indocin colcrys Follow up as needed  Roselee Culver, MD

## 2011-12-08 ENCOUNTER — Encounter (HOSPITAL_BASED_OUTPATIENT_CLINIC_OR_DEPARTMENT_OTHER): Payer: Self-pay | Admitting: *Deleted

## 2011-12-08 NOTE — Progress Notes (Signed)
To [facility] at 1030- [place],Ekg on arrival -Npo after Mn.

## 2011-12-15 ENCOUNTER — Ambulatory Visit (HOSPITAL_BASED_OUTPATIENT_CLINIC_OR_DEPARTMENT_OTHER)
Admission: RE | Admit: 2011-12-15 | Discharge: 2011-12-15 | Disposition: A | Discharge status: Home / Self Care | Payer: BC Managed Care – PPO | Source: Ambulatory Visit | Attending: Urology | Admitting: Urology

## 2011-12-15 ENCOUNTER — Encounter (HOSPITAL_BASED_OUTPATIENT_CLINIC_OR_DEPARTMENT_OTHER): Payer: Self-pay | Admitting: Anesthesiology

## 2011-12-15 ENCOUNTER — Ambulatory Visit (HOSPITAL_COMMUNITY): Payer: BC Managed Care – PPO

## 2011-12-15 ENCOUNTER — Encounter (HOSPITAL_BASED_OUTPATIENT_CLINIC_OR_DEPARTMENT_OTHER): Payer: Self-pay

## 2011-12-15 ENCOUNTER — Encounter (HOSPITAL_BASED_OUTPATIENT_CLINIC_OR_DEPARTMENT_OTHER)
Admission: RE | Disposition: A | Discharge status: Home / Self Care | Payer: Self-pay | Source: Ambulatory Visit | Attending: Urology

## 2011-12-15 ENCOUNTER — Ambulatory Visit (HOSPITAL_BASED_OUTPATIENT_CLINIC_OR_DEPARTMENT_OTHER): Payer: BC Managed Care – PPO | Admitting: Anesthesiology

## 2011-12-15 DIAGNOSIS — D494 Neoplasm of unspecified behavior of bladder: Secondary | ICD-10-CM

## 2011-12-15 DIAGNOSIS — N35919 Unspecified urethral stricture, male, unspecified site: Secondary | ICD-10-CM | POA: Insufficient documentation

## 2011-12-15 DIAGNOSIS — C679 Malignant neoplasm of bladder, unspecified: Secondary | ICD-10-CM | POA: Insufficient documentation

## 2011-12-15 HISTORY — DX: Other complications of anesthesia, initial encounter: T88.59XA

## 2011-12-15 HISTORY — DX: Adverse effect of unspecified anesthetic, initial encounter: T41.45XA

## 2011-12-15 HISTORY — PX: TRANSURETHRAL RESECTION OF BLADDER TUMOR: SHX2575

## 2011-12-15 HISTORY — DX: Gout, unspecified: M10.9

## 2011-12-15 LAB — POCT I-STAT 4, (NA,K, GLUC, HGB,HCT)
Glucose, Bld: 146 mg/dL — ABNORMAL HIGH (ref 70–99)
HCT: 42 % (ref 39.0–52.0)
Hemoglobin: 14.3 g/dL (ref 13.0–17.0)
Potassium: 3.9 mEq/L (ref 3.5–5.1)
Sodium: 138 mEq/L (ref 135–145)

## 2011-12-15 SURGERY — TURBT (TRANSURETHRAL RESECTION OF BLADDER TUMOR)
Anesthesia: General | Site: Bladder | Wound class: Clean Contaminated

## 2011-12-15 MED ORDER — LIDOCAINE HCL 2 % EX GEL
CUTANEOUS | Status: DC | PRN
  Administered 2011-12-15: 1

## 2011-12-15 MED ORDER — CEPHALEXIN 500 MG PO CAPS: 500.0000 mg | ORAL_CAPSULE | Freq: Three times a day (TID) | ORAL | Status: AC

## 2011-12-15 MED ORDER — STERILE WATER FOR IRRIGATION IR SOLN
Status: DC | PRN
  Administered 2011-12-15: 10 mL

## 2011-12-15 MED ORDER — MIDAZOLAM HCL 5 MG/5ML IJ SOLN
INTRAMUSCULAR | Status: DC | PRN
  Administered 2011-12-15: 1 mg via INTRAVENOUS
  Administered 2011-12-15: 2 mg via INTRAVENOUS
  Administered 2011-12-15: 1 mg via INTRAVENOUS

## 2011-12-15 MED ORDER — OXYBUTYNIN CHLORIDE 5 MG PO TABS: 5.0000 mg | ORAL_TABLET | Freq: Four times a day (QID) | ORAL | Status: DC | PRN

## 2011-12-15 MED ORDER — PROPOFOL 10 MG/ML IV BOLUS
INTRAVENOUS | Status: DC | PRN
  Administered 2011-12-15: 200 mg via INTRAVENOUS

## 2011-12-15 MED ORDER — ONDANSETRON HCL 4 MG/2ML IJ SOLN
INTRAMUSCULAR | Status: DC | PRN
  Administered 2011-12-15: 4 mg via INTRAVENOUS

## 2011-12-15 MED ORDER — PHENAZOPYRIDINE HCL 100 MG PO TABS: 100.0000 mg | ORAL_TABLET | Freq: Three times a day (TID) | ORAL | Status: AC | PRN

## 2011-12-15 MED ORDER — SODIUM CHLORIDE 0.9 % IR SOLN
Status: DC | PRN
  Administered 2011-12-15: 12000 mL via INTRAVESICAL

## 2011-12-15 MED ORDER — OXYCODONE-ACETAMINOPHEN 5-325 MG PO TABS: 1.0000 | ORAL_TABLET | ORAL | Status: AC | PRN

## 2011-12-15 MED ORDER — HYOSCYAMINE SULFATE 0.125 MG PO TABS: 0.1250 mg | ORAL_TABLET | ORAL | Status: DC | PRN

## 2011-12-15 MED ORDER — KETOROLAC TROMETHAMINE 30 MG/ML IJ SOLN
INTRAMUSCULAR | Status: DC | PRN
  Administered 2011-12-15: 30 mg via INTRAVENOUS

## 2011-12-15 MED ORDER — LACTATED RINGERS IV SOLN: INTRAVENOUS | Status: DC

## 2011-12-15 MED ORDER — BELLADONNA ALKALOIDS-OPIUM 16.2-60 MG RE SUPP
RECTAL | Status: DC | PRN
  Administered 2011-12-15: 1 via RECTAL

## 2011-12-15 MED ORDER — LACTATED RINGERS IV SOLN
INTRAVENOUS | Status: DC
  Administered 2011-12-15 (×3): via INTRAVENOUS

## 2011-12-15 MED ORDER — LIDOCAINE HCL (CARDIAC) 20 MG/ML IV SOLN
INTRAVENOUS | Status: DC | PRN
  Administered 2011-12-15: 80 mg via INTRAVENOUS

## 2011-12-15 MED ORDER — FENTANYL CITRATE 0.05 MG/ML IJ SOLN
INTRAMUSCULAR | Status: DC | PRN
  Administered 2011-12-15: 50 ug via INTRAVENOUS
  Administered 2011-12-15: 25 ug via INTRAVENOUS
  Administered 2011-12-15: 75 ug via INTRAVENOUS
  Administered 2011-12-15 (×5): 50 ug via INTRAVENOUS
  Administered 2011-12-15: 50 ug

## 2011-12-15 MED ORDER — SUCCINYLCHOLINE CHLORIDE 20 MG/ML IJ SOLN
INTRAMUSCULAR | Status: DC | PRN
  Administered 2011-12-15: 60 mg via INTRAVENOUS
  Administered 2011-12-15: 100 mg via INTRAVENOUS

## 2011-12-15 MED ORDER — FENTANYL CITRATE 0.05 MG/ML IJ SOLN
25.0000 ug | INTRAMUSCULAR | Status: DC | PRN
  Administered 2011-12-15: 25 ug via INTRAVENOUS

## 2011-12-15 MED ORDER — DEXAMETHASONE SODIUM PHOSPHATE 4 MG/ML IJ SOLN
INTRAMUSCULAR | Status: DC | PRN
  Administered 2011-12-15: 8 mg via INTRAVENOUS

## 2011-12-15 MED ORDER — SENNOSIDES-DOCUSATE SODIUM 8.6-50 MG PO TABS: 1.0000 | ORAL_TABLET | Freq: Two times a day (BID) | ORAL | Status: DC

## 2011-12-15 MED ORDER — OXYBUTYNIN CHLORIDE 5 MG PO TABS
5.0000 mg | ORAL_TABLET | Freq: Four times a day (QID) | ORAL | Status: DC
  Administered 2011-12-15: 5 mg via ORAL

## 2011-12-15 MED ORDER — IOHEXOL 350 MG/ML SOLN
INTRAVENOUS | Status: DC | PRN
  Administered 2011-12-15: 4 mL

## 2011-12-15 MED ORDER — CEFAZOLIN SODIUM-DEXTROSE 2-3 GM-% IV SOLR
2.0000 g | INTRAVENOUS | Status: AC
  Administered 2011-12-15: 2 g via INTRAVENOUS

## 2011-12-15 SURGICAL SUPPLY — 63 items
ADAPTER CATH URET PLST 4-6FR (CATHETERS) ×4 IMPLANT
ADPR CATH URET STRL DISP 4-6FR (CATHETERS) ×3
BAG DRAIN URO-CYSTO SKYTR STRL (DRAIN) ×4 IMPLANT
BAG DRN ANRFLXCHMBR STRAP LEK (BAG)
BAG DRN UROCATH (DRAIN) ×3
BAG URINE DRAINAGE (UROLOGICAL SUPPLIES) IMPLANT
BAG URINE LEG 19OZ MD ST LTX (BAG) IMPLANT
BASKET LASER NITINOL 1.9FR (BASKET) IMPLANT
BASKET STNLS GEMINI 4WIRE 3FR (BASKET) IMPLANT
BASKET ZERO TIP NITINOL 2.4FR (BASKET) IMPLANT
BRUSH URET BIOPSY 3F (UROLOGICAL SUPPLIES) IMPLANT
BSKT STON RTRVL 120 1.9FR (BASKET)
BSKT STON RTRVL GEM 120X11 3FR (BASKET)
BSKT STON RTRVL ZERO TP 2.4FR (BASKET)
CANISTER SUCT LVC 12 LTR MEDI- (MISCELLANEOUS) ×8 IMPLANT
CATH FOLEY 2WAY SLVR  5CC 20FR (CATHETERS)
CATH FOLEY 2WAY SLVR  5CC 22FR (CATHETERS)
CATH FOLEY 2WAY SLVR  5CC 24FR (CATHETERS)
CATH FOLEY 2WAY SLVR 5CC 20FR (CATHETERS) IMPLANT
CATH FOLEY 2WAY SLVR 5CC 22FR (CATHETERS) IMPLANT
CATH FOLEY 2WAY SLVR 5CC 24FR (CATHETERS) IMPLANT
CATH HEMA 3WAY 30CC 24FR COUDE (CATHETERS) IMPLANT
CATH HEMA 3WAY 30CC 24FR RND (CATHETERS) IMPLANT
CATH INTERMIT  6FR 70CM (CATHETERS) ×4 IMPLANT
CATH URET 5FR 28IN CONE TIP (BALLOONS)
CATH URET 5FR 28IN OPEN ENDED (CATHETERS) ×4 IMPLANT
CATH URET 5FR 70CM CONE TIP (BALLOONS) IMPLANT
CLOTH BEACON ORANGE TIMEOUT ST (SAFETY) ×4 IMPLANT
DRAPE CAMERA CLOSED 9X96 (DRAPES) ×4 IMPLANT
ELECT BUTTON BIOP 24F 90D PLAS (MISCELLANEOUS) IMPLANT
ELECT BUTTON HF 24-28F 2 30DE (ELECTRODE) IMPLANT
ELECT LOOP MED HF 24F 12D (CUTTING LOOP) IMPLANT
ELECT LOOP MED HF 24F 12D CBL (CLIP) ×4 IMPLANT
ELECT REM PT RETURN 9FT ADLT (ELECTROSURGICAL) ×4
ELECTRODE REM PT RTRN 9FT ADLT (ELECTROSURGICAL) ×3 IMPLANT
EVACUATOR MICROVAS BLADDER (UROLOGICAL SUPPLIES) IMPLANT
GLOVE BIO SURGEON STRL SZ8 (GLOVE) ×4 IMPLANT
GLOVE ECLIPSE 6.0 STRL STRAW (GLOVE) ×4 IMPLANT
GLOVE ECLIPSE 7.0 STRL STRAW (GLOVE) ×4 IMPLANT
GLOVE INDICATOR 6.5 STRL GRN (GLOVE) ×4 IMPLANT
GLOVE INDICATOR 7.5 STRL GRN (GLOVE) ×4 IMPLANT
GOWN PREVENTION PLUS LG XLONG (DISPOSABLE) ×4 IMPLANT
GOWN STRL REIN XL XLG (GOWN DISPOSABLE) ×4 IMPLANT
GUIDEWIRE 0.038 PTFE COATED (WIRE) IMPLANT
GUIDEWIRE ANG ZIPWIRE 038X150 (WIRE) IMPLANT
GUIDEWIRE STR DUAL SENSOR (WIRE) ×4 IMPLANT
HOLDER FOLEY CATH W/STRAP (MISCELLANEOUS) IMPLANT
IV NS IRRIG 3000ML ARTHROMATIC (IV SOLUTION) ×16 IMPLANT
KIT ASPIRATION TUBING (SET/KITS/TRAYS/PACK) IMPLANT
KIT BALLIN UROMAX 15FX10 (LABEL) IMPLANT
KIT BALLN UROMAX 15FX4 (MISCELLANEOUS) IMPLANT
KIT BALLN UROMAX 26 75X4 (MISCELLANEOUS)
LASER FIBER DISP (UROLOGICAL SUPPLIES) IMPLANT
NS IRRIG 500ML POUR BTL (IV SOLUTION) ×4 IMPLANT
PACK CYSTOSCOPY (CUSTOM PROCEDURE TRAY) ×4 IMPLANT
PLUG CATH AND CAP STER (CATHETERS) IMPLANT
SET ASPIRATION TUBING (TUBING) ×4 IMPLANT
SET HIGH PRES BAL DIL (LABEL)
SHEATH ACCESS URETERAL 38CM (SHEATH) IMPLANT
SHEATH ACCESS URETERAL 54CM (SHEATH) IMPLANT
SHEATH URET ACCESS 12FR/35CM (UROLOGICAL SUPPLIES) IMPLANT
SHEATH URET ACCESS 12FR/55CM (UROLOGICAL SUPPLIES) IMPLANT
SYRINGE IRR TOOMEY STRL 70CC (SYRINGE) ×4 IMPLANT

## 2011-12-15 NOTE — Transfer of Care (Signed)
  Immediate Anesthesia Transfer of Care Note  Patient: [name] [name]  Procedure(s) Performed: Procedure(s) (LRB): TRANSURETHRAL RESECTION OF BLADDER TUMOR (TURBT) (N/A) CYSTOSCOPY WITH RETROGRADE PYELOGRAM (Left) CYSTOSCOPY WITH URETHRAL DILATATION (N/A)  Patient Location: PACU  Anesthesia Type: General  Level of Consciousness: anxious, c/o pain, uncooperative.  Airway & Oxygen Therapy: Patient Spontanous Breathing and Patient connected to face mask oxygen  Post-op Assessment: Report given to PACU RN and Post -op Vital signs reviewed and stable  Post vital signs: Reviewed and stable  Complications: No apparent anesthesia complications

## 2011-12-15 NOTE — Anesthesia Preprocedure Evaluation (Addendum)
Anesthesia Evaluation  Patient identified by MRN, date of birth, ID band Patient awake    Reviewed: Allergy & Precautions, H&P , NPO status , Patient's Chart, lab work & pertinent test results  Airway Mallampati: II TM Distance: >3 FB Neck ROM: full    Dental No notable dental hx. (+) Teeth Intact and Dental Advisory Given   Pulmonary neg pulmonary ROS,  breath sounds clear to auscultation  Pulmonary exam normal       Cardiovascular Exercise Tolerance: Good negative cardio ROS  Rhythm:regular Rate:Normal     Neuro/Psych negative neurological ROS  negative psych ROS   GI/Hepatic negative GI ROS, Neg liver ROS,   Endo/Other  negative endocrine ROSdiabetes, Well Controlled, Type 2, Oral Hypoglycemic Agents  Renal/GU negative Renal ROS  negative genitourinary   Musculoskeletal   Abdominal   Peds  Hematology negative hematology ROS (+)   Anesthesia Other Findings   Reproductive/Obstetrics negative OB ROS                          Anesthesia Physical Anesthesia Plan  ASA: II  Anesthesia Plan: General   Post-op Pain Management:    Induction: Intravenous  Airway Management Planned: LMA  Additional Equipment:   Intra-op Plan:   Post-operative Plan:   Informed Consent: I have reviewed the patients History and Physical, chart, labs and discussed the procedure including the risks, benefits and alternatives for the proposed anesthesia with the patient or authorized representative who has indicated his/her understanding and acceptance.   Dental Advisory Given  Plan Discussed with: CRNA and Surgeon  Anesthesia Plan Comments:         Anesthesia Quick Evaluation

## 2011-12-15 NOTE — Anesthesia Postprocedure Evaluation (Signed)
  Anesthesia Post-op Note  Patient: [name] [name]  Procedure(s) Performed: Procedure(s) (LRB): TRANSURETHRAL RESECTION OF BLADDER TUMOR (TURBT) (N/A) CYSTOSCOPY WITH RETROGRADE PYELOGRAM (Left) CYSTOSCOPY WITH URETHRAL DILATATION (N/A)  Patient Location: PACU  Anesthesia Type: General  Level of Consciousness: awake and alert   Airway and Oxygen Therapy: Patient Spontanous Breathing  Post-op Pain: mild  Post-op Assessment: Post-op Vital signs reviewed, Patient's Cardiovascular Status Stable, Respiratory Function Stable, Patent Airway and No signs of Nausea or vomiting  Post-op Vital Signs: stable  Complications: No apparent anesthesia complications

## 2011-12-15 NOTE — H&P (Signed)
Urology History and Physical Exam  CC: Bladder tumor  HPI: 51 year old male presents today for cystoscopy, transurethral resection of bladder tumor, bilateral retrograde pyelograms, possible bilateral ureteroscopy with biopsy and ureteral stent placement. He was discovered to have a tumor in his bladder on office cystoscopy during a workup for hematuria. There were no filling defects in his ureters or collecting systems CT hematuria protocol in August of 2013. His tumor is between 2 and 5 cm in size on the left lateral wall which encroaches on the anterior left wall of the bladder. The left ureter orifice is not visualized due to the tumor. We have discussed the risks, benefits, alternatives, and likelihood of achieving goals. Preoperative urine culture was negative for growth.        PMH: Past Medical History  Diagnosis Date  . Gout     recent flair -bilateral feet  . Bladder tumor 2013    new diagnosis  . Complication of anesthesia     depression after '07 surgery    PSH: Past Surgical History  Procedure Date  . Back surgery 2007  . Ulner artery repair 2009    rt -trauma /thrombectomy,repair    Allergies: No Known Allergies  Medications: No prescriptions prior to admission     Social History: History   Social History  . Marital Status: Married    Spouse Name: N/A    Number of Children: N/A  . Years of Education: N/A   Occupational History  . Not on file.   Social History Main Topics  . Smoking status: Current Every Day Smoker -- 1.0 packs/day  . Smokeless tobacco: Not on file  . Alcohol Use: No  . Drug Use: No  . Sexually Active: Not on file   Other Topics Concern  . Not on file   Social History Narrative  . No narrative on file    Family History: History reviewed. No pertinent family history.  Review of Systems: Positive: Gross hematuria. Negative: Chest pain, fever, SOB.  A further 10 point review of systems was negative except what is listed  in the HPI.  Physical Exam:  General: No acute distress.  Awake. Head:  Normocephalic.  Atraumatic. ENT:  EOMI.  Mucous membranes moist Neck:  Supple.  No lymphadenopathy. CV:  S1 present. S2 present. Regular rate. Pulmonary: Equal effort bilaterally.  Clear to auscultation bilaterally. Abdomen: Soft.  Non- tender to palpation. Skin:  Normal turgor.  No visible rash. Extremity: No gross deformity of bilateral upper extremities.  No gross deformity of    bilateral lower extremities. Neurologic: Alert. Appropriate mood.   Studies:  No results found for this basename: HGB:2,WBC:2,PLT:2 in the last 72 hours  No results found for this basename: NA:2,K:2,CL:2,CO2:2,BUN:2,CREATININE:2,CALCIUM:2,MAGNESIUM:2,GFRNONAA:2,GFRAA:2 in the last 72 hours   No results found for this basename: PT:2,INR:2,APTT:2 in the last 72 hours   No components found with this basename: ABG:2    Assessment:  Bladder tumor  Plan: To OR for cystoscopy, transurethral resection of the bladder tumor, bilateral retrograde pyelograms, possible bilateral ureteroscopy with biopsy and ureteral stent placement.

## 2011-12-15 NOTE — Brief Op Note (Signed)
12/15/2011  1:05 PM  PATIENT:  [name] [name]  51 y.o. male  PRE-OPERATIVE DIAGNOSIS:  BLADDER LESION  POST-OPERATIVE DIAGNOSIS:  BLADDER LESION  PROCEDURE:   Transurethral resection of bladder tumor Left retrograde pyelogram Urethral meatal dilation Cystoscopy  SURGEON:  Surgeon(s) and Role:    * Molli Hazard, MD - Primary  PHYSICIAN ASSISTANT:   ASSISTANTS: none   ANESTHESIA:   general  EBL:  Total I/O In: 1000 [I.V.:1000] Out: -   BLOOD ADMINISTERED:none  DRAINS: Urinary Catheter (Foley)   LOCAL MEDICATIONS USED:  LIDOCAINE , Amount: 10 ml and OTHER B&O suppository.  SPECIMEN:  Source of Specimen:  Bladder tumor  DISPOSITION OF SPECIMEN:  PATHOLOGY  COUNTS:  YES  TOURNIQUET:  * No tourniquets in log *  DICTATION: .Other Dictation: Dictation Number [id number]  PLAN OF CARE: Discharge to home after PACU  PATIENT DISPOSITION:  PACU - hemodynamically stable.   Delay start of Pharmacological VTE agent (>24hrs) due to surgical blood loss or risk of bleeding: yes

## 2011-12-16 ENCOUNTER — Encounter (HOSPITAL_BASED_OUTPATIENT_CLINIC_OR_DEPARTMENT_OTHER): Payer: Self-pay | Admitting: Urology

## 2011-12-16 NOTE — Op Note (Signed)
NAMEDONAVIN, FORTUNE NO.:  [id number]  MEDICAL RECORD NO.:  [id number]  LOCATION:                               FACILITY:  [facility]  PHYSICIAN:  Rolan Bucco, MD    DATE OF BIRTH:  [birth year]  DATE OF PROCEDURE:  12/15/2011 DATE OF DISCHARGE:                              OPERATIVE REPORT   SURGEON:  Rolan Bucco, MD  ASSISTANT:  None.  PREOPERATIVE DIAGNOSIS:  Bladder tumor.  POSTOPERATIVE DIAGNOSIS:  Bladder tumor.  PROCEDURES PERFORMED: 1. Transurethral resection of bladder tumor, medium size, greater than     2 cm, less than 5 cm. 2. Left retrograde pyelogram with interpretation. 3. Urethral meatal dilation. 4. Cystoscopy.  ESTIMATED BLOOD LOSS:  Less than 10 mL.  COMPLICATIONS:  None.  DRAINS:  Foley catheter.  FINDINGS:  Large flat bladder tumor on the left bladder wall and anterior left bladder wall.  Left retrograde pyelogram shows no filling defects.  Left ureteral orifice was free of tumor.  SPECIMEN:  TURBT bladder specimen sent and then a separate specimen was sent of a biopsy of the posterior bladder wall.  HISTORY OF PRESENT ILLNESS:  This is a 51 year old gentleman who presented with gross hematuria.  Office cystoscopy revealed bladder tumor.  He was counseled on options and he presents today for resection of that bladder tumor transurethrally.  He did have a CT hematuria protocol, which showed no filling defects in his ureters.  PROCEDURE:  After informed consent was obtained, the patient was taken to the operating room where he was placed in the supine position.  IV antibiotics were infused and SCDs were in place and turned on prior to the induction of anesthesia.  Anesthesia was then induced and then he was placed in the dorsal lithotomy position making sure to pad all pertinent neurovascular pressure points appropriately.  Following this, his genitals were prepped and draped in usual sterile fashion.  Time-out was  performed, which the correct patient, surgical site, and procedure were identified and agreed upon by the team.  I attempted to place the visual obturator to the gyrus resectoscope; however, this was unable to be placed due to some narrowing in his urethral meatus.  Therefore, male urethral sounds were used to dilate the meatus beginning with 18-French up to 28-French.  After this, the visual obturator was placed with ease.    It was noted that he had an area in his bulbar urethra that appeared to have some scar tissue, but this was easily passed through. Once in the bladder, I evaluated this bladder with a 30-degree and 70- degree lens.  There was noted to be a tumor that was flat involving the left lateral bladder wall and left anterior bladder wall contiguously. There were no other tumors anywhere else.  Next, I injected contrast through a 5-French ureteral catheter into the left ureter and a retrograde pyelogram was obtained.  There were no filling defects and this side emptied out well.  Next, this resection loop was placed onto the gyrus resection device and resection was carried out of the bladder tumor.  The chips of the bladder tumor were evacuated.  There was no tumor covering the left  ureteral orifice, but it was surrounding this with a margin free of tumor.  Therefore, I was able to resect around this without resecting over the ureteral orifice.  Hemostasis was maintained with the resection loop.   A cold cup biopsy forceps was used to biopsy of the posterior bladder wall, which had some erythema. This area was also fulgurated.  After this was done, it appeared that there was good muscle specimen taken and I did not see any bladder perforations, but the bladder was very thin.  Therefore, I elected to leave a Foley catheter in place.  A 10 mL of lidocaine jelly were placed into the urethra and then an 18-French Coude-tipped catheter was placed with ease and 10 mL of sterile  water were placed into the balloon.  I placed a belladonna and opium suppository into his rectum and performed a rectal exam, this revealed no nodules or concerning findings.  This completed the procedure.  He was placed back in the supine position. Anesthesia was reversed and he has taken to PACU in stable condition. He will follow up on Monday, December 20, 2011, for nurse visit for catheter removal and then he will follow up to see me on December 28, 2011.          ______________________________ Rolan Bucco, MD     DW/MEDQ  D:  12/15/2011  T:  12/16/2011  Job:  [id number]

## 2011-12-21 ENCOUNTER — Encounter (HOSPITAL_BASED_OUTPATIENT_CLINIC_OR_DEPARTMENT_OTHER): Payer: Self-pay

## 2011-12-23 ENCOUNTER — Other Ambulatory Visit: Payer: Self-pay | Admitting: Urology

## 2012-01-12 ENCOUNTER — Other Ambulatory Visit: Payer: Self-pay | Admitting: Urology

## 2012-01-13 MED ORDER — LEVOFLOXACIN IN D5W 750 MG/150ML IV SOLN: 500.0000 mg | INTRAVENOUS | Status: DC

## 2012-01-20 ENCOUNTER — Encounter (HOSPITAL_BASED_OUTPATIENT_CLINIC_OR_DEPARTMENT_OTHER): Payer: Self-pay | Admitting: *Deleted

## 2012-01-20 NOTE — Progress Notes (Signed)
NPO AFTER MN. ARRIVES AT 1115. NEEDS ISTAT. CURRENT EKG IN EPIC AND CHART.

## 2012-01-24 ENCOUNTER — Encounter (HOSPITAL_BASED_OUTPATIENT_CLINIC_OR_DEPARTMENT_OTHER)
Admission: RE | Disposition: A | Discharge status: Home / Self Care | Payer: Self-pay | Source: Ambulatory Visit | Attending: Urology

## 2012-01-24 ENCOUNTER — Encounter (HOSPITAL_BASED_OUTPATIENT_CLINIC_OR_DEPARTMENT_OTHER): Payer: Self-pay | Admitting: Anesthesiology

## 2012-01-24 ENCOUNTER — Ambulatory Visit (HOSPITAL_BASED_OUTPATIENT_CLINIC_OR_DEPARTMENT_OTHER): Payer: BC Managed Care – PPO | Admitting: Anesthesiology

## 2012-01-24 ENCOUNTER — Ambulatory Visit (HOSPITAL_BASED_OUTPATIENT_CLINIC_OR_DEPARTMENT_OTHER)
Admission: RE | Admit: 2012-01-24 | Discharge: 2012-01-24 | Disposition: A | Discharge status: Home / Self Care | Payer: BC Managed Care – PPO | Source: Ambulatory Visit | Attending: Urology | Admitting: Urology

## 2012-01-24 ENCOUNTER — Encounter (HOSPITAL_BASED_OUTPATIENT_CLINIC_OR_DEPARTMENT_OTHER): Payer: Self-pay | Admitting: *Deleted

## 2012-01-24 DIAGNOSIS — E119 Type 2 diabetes mellitus without complications: Secondary | ICD-10-CM | POA: Insufficient documentation

## 2012-01-24 DIAGNOSIS — N3289 Other specified disorders of bladder: Secondary | ICD-10-CM | POA: Insufficient documentation

## 2012-01-24 DIAGNOSIS — N2889 Other specified disorders of kidney and ureter: Secondary | ICD-10-CM | POA: Insufficient documentation

## 2012-01-24 DIAGNOSIS — N3080 Other cystitis without hematuria: Secondary | ICD-10-CM | POA: Insufficient documentation

## 2012-01-24 DIAGNOSIS — C679 Malignant neoplasm of bladder, unspecified: Secondary | ICD-10-CM | POA: Insufficient documentation

## 2012-01-24 HISTORY — DX: Urge incontinence: N39.41

## 2012-01-24 HISTORY — DX: Type 2 diabetes mellitus without complications: E11.9

## 2012-01-24 HISTORY — PX: CYSTOSCOPY WITH URETHRAL DILATATION: SHX5125

## 2012-01-24 HISTORY — PX: TRANSURETHRAL RESECTION OF BLADDER TUMOR: SHX2575

## 2012-01-24 HISTORY — DX: Frequency of micturition: R35.0

## 2012-01-24 HISTORY — DX: Nocturia: R35.1

## 2012-01-24 HISTORY — DX: Malignant neoplasm of bladder, unspecified: C67.9

## 2012-01-24 LAB — POCT I-STAT 4, (NA,K, GLUC, HGB,HCT)
Glucose, Bld: 119 mg/dL — ABNORMAL HIGH (ref 70–99)
HCT: 41 % (ref 39.0–52.0)
Potassium: 4.7 mEq/L (ref 3.5–5.1)

## 2012-01-24 LAB — GLUCOSE, CAPILLARY: Glucose-Capillary: 148 mg/dL — ABNORMAL HIGH (ref 70–99)

## 2012-01-24 SURGERY — TURBT (TRANSURETHRAL RESECTION OF BLADDER TUMOR)
Anesthesia: General | Site: Urethra | Wound class: Clean Contaminated

## 2012-01-24 MED ORDER — SUCCINYLCHOLINE CHLORIDE 20 MG/ML IJ SOLN
INTRAMUSCULAR | Status: DC | PRN
  Administered 2012-01-24: 80 mg via INTRAVENOUS
  Administered 2012-01-24: 30 mg via INTRAVENOUS
  Administered 2012-01-24: 10 mg via INTRAVENOUS
  Administered 2012-01-24: 80 mg via INTRAVENOUS

## 2012-01-24 MED ORDER — OXYBUTYNIN CHLORIDE 5 MG PO TABS: 5.0000 mg | ORAL_TABLET | Freq: Four times a day (QID) | ORAL | Status: AC | PRN

## 2012-01-24 MED ORDER — OXYCODONE-ACETAMINOPHEN 5-325 MG PO TABS: 1.0000 | ORAL_TABLET | ORAL | Status: DC | PRN

## 2012-01-24 MED ORDER — LACTATED RINGERS IV SOLN
INTRAVENOUS | Status: DC
  Administered 2012-01-24: 15:00:00 via INTRAVENOUS
  Administered 2012-01-24: 100 mL/h via INTRAVENOUS

## 2012-01-24 MED ORDER — LEVOFLOXACIN IN D5W 500 MG/100ML IV SOLN
500.0000 mg | Freq: Once | INTRAVENOUS | Status: AC
  Administered 2012-01-24: 500 mg via INTRAVENOUS

## 2012-01-24 MED ORDER — HYOSCYAMINE SULFATE 0.125 MG SL SUBL
0.1250 mg | SUBLINGUAL_TABLET | SUBLINGUAL | Status: DC | PRN
  Administered 2012-01-24: 0.125 mg via SUBLINGUAL

## 2012-01-24 MED ORDER — KETOROLAC TROMETHAMINE 30 MG/ML IJ SOLN
INTRAMUSCULAR | Status: DC | PRN
  Administered 2012-01-24: 30 mg via INTRAVENOUS

## 2012-01-24 MED ORDER — SODIUM CHLORIDE 0.9 % IR SOLN
Status: DC | PRN
  Administered 2012-01-24: 3000 mL via INTRAVESICAL

## 2012-01-24 MED ORDER — FENTANYL CITRATE 0.05 MG/ML IJ SOLN
INTRAMUSCULAR | Status: DC | PRN
  Administered 2012-01-24: 50 ug via INTRAVENOUS
  Administered 2012-01-24: 100 ug via INTRAVENOUS
  Administered 2012-01-24: 50 ug via INTRAVENOUS

## 2012-01-24 MED ORDER — PROPOFOL 10 MG/ML IV BOLUS
INTRAVENOUS | Status: DC | PRN
  Administered 2012-01-24: 250 mg via INTRAVENOUS

## 2012-01-24 MED ORDER — FENTANYL CITRATE 0.05 MG/ML IJ SOLN
25.0000 ug | INTRAMUSCULAR | Status: DC | PRN
  Administered 2012-01-24: 25 ug via INTRAVENOUS

## 2012-01-24 MED ORDER — SENNOSIDES-DOCUSATE SODIUM 8.6-50 MG PO TABS: 1.0000 | ORAL_TABLET | Freq: Two times a day (BID) | ORAL | Status: AC

## 2012-01-24 MED ORDER — HYOSCYAMINE SULFATE 0.125 MG PO TABS: 0.1250 mg | ORAL_TABLET | ORAL | Status: DC | PRN

## 2012-01-24 MED ORDER — KETAMINE HCL 10 MG/ML IJ SOLN
INTRAMUSCULAR | Status: DC | PRN
  Administered 2012-01-24 (×4): 10 mg via INTRAVENOUS

## 2012-01-24 MED ORDER — AMPICILLIN 500 MG PO CAPS: 500.0000 mg | ORAL_CAPSULE | Freq: Four times a day (QID) | ORAL | Status: DC

## 2012-01-24 MED ORDER — ONDANSETRON HCL 4 MG/2ML IJ SOLN
INTRAMUSCULAR | Status: DC | PRN
  Administered 2012-01-24: 4 mg via INTRAVENOUS

## 2012-01-24 MED ORDER — BACIT-POLY-NEO HC 1 % EX OINT: TOPICAL_OINTMENT | Freq: Two times a day (BID) | CUTANEOUS | Status: DC

## 2012-01-24 MED ORDER — GLYCOPYRROLATE 0.2 MG/ML IJ SOLN
INTRAMUSCULAR | Status: DC | PRN
  Administered 2012-01-24: 0.2 mg via INTRAVENOUS

## 2012-01-24 MED ORDER — LIDOCAINE HCL (CARDIAC) 20 MG/ML IV SOLN
INTRAVENOUS | Status: DC | PRN
  Administered 2012-01-24: 100 mg via INTRAVENOUS

## 2012-01-24 MED ORDER — ACETAMINOPHEN 10 MG/ML IV SOLN
INTRAVENOUS | Status: DC | PRN
  Administered 2012-01-24: 1000 mg via INTRAVENOUS

## 2012-01-24 MED ORDER — STERILE WATER FOR IRRIGATION IR SOLN
Status: DC | PRN
  Administered 2012-01-24: 3000 mL via INTRAVESICAL

## 2012-01-24 MED ORDER — BELLADONNA ALKALOIDS-OPIUM 16.2-60 MG RE SUPP
RECTAL | Status: DC | PRN
  Administered 2012-01-24: 1 via RECTAL

## 2012-01-24 MED ORDER — LABETALOL HCL 5 MG/ML IV SOLN
INTRAVENOUS | Status: DC | PRN
  Administered 2012-01-24 (×2): 5 mg via INTRAVENOUS

## 2012-01-24 MED ORDER — LIDOCAINE HCL 2 % EX GEL
CUTANEOUS | Status: DC | PRN
  Administered 2012-01-24: 1

## 2012-01-24 MED ORDER — PROMETHAZINE HCL 25 MG/ML IJ SOLN: 6.2500 mg | INTRAMUSCULAR | Status: DC | PRN

## 2012-01-24 MED ORDER — BACITRACIN-NEOMYCIN-POLYMYXIN 400-5-5000 EX OINT: TOPICAL_OINTMENT | Freq: Three times a day (TID) | CUTANEOUS | Status: DC

## 2012-01-24 MED ORDER — PHENAZOPYRIDINE HCL 100 MG PO TABS
100.0000 mg | ORAL_TABLET | Freq: Three times a day (TID) | ORAL | Status: DC
  Administered 2012-01-24: 100 mg via ORAL

## 2012-01-24 MED ORDER — PHENAZOPYRIDINE HCL 100 MG PO TABS: 200.0000 mg | ORAL_TABLET | Freq: Three times a day (TID) | ORAL | Status: DC | PRN

## 2012-01-24 MED ORDER — DEXAMETHASONE SODIUM PHOSPHATE 4 MG/ML IJ SOLN
INTRAMUSCULAR | Status: DC | PRN
  Administered 2012-01-24: 8 mg via INTRAVENOUS

## 2012-01-24 SURGICAL SUPPLY — 65 items
ADAPTER CATH URET PLST 4-6FR (CATHETERS) IMPLANT
ADPR CATH URET STRL DISP 4-6FR (CATHETERS)
BAG DRAIN URO-CYSTO SKYTR STRL (DRAIN) ×5 IMPLANT
BAG DRN ANRFLXCHMBR STRAP LEK (BAG) ×4
BAG DRN UROCATH (DRAIN) ×4
BAG URINE DRAINAGE (UROLOGICAL SUPPLIES) IMPLANT
BAG URINE LEG 19OZ MD ST LTX (BAG) ×5 IMPLANT
BASKET LASER NITINOL 1.9FR (BASKET) IMPLANT
BASKET STNLS GEMINI 4WIRE 3FR (BASKET) IMPLANT
BASKET ZERO TIP NITINOL 2.4FR (BASKET) IMPLANT
BRUSH URET BIOPSY 3F (UROLOGICAL SUPPLIES) IMPLANT
BSKT STON RTRVL 120 1.9FR (BASKET)
BSKT STON RTRVL GEM 120X11 3FR (BASKET)
BSKT STON RTRVL ZERO TP 2.4FR (BASKET)
CANISTER SUCT LVC 12 LTR MEDI- (MISCELLANEOUS) ×5 IMPLANT
CATH COUDE FOLEY 2W 5CC 20FR (CATHETERS) ×5 IMPLANT
CATH FOLEY 2WAY SLVR  5CC 20FR (CATHETERS)
CATH FOLEY 2WAY SLVR  5CC 22FR (CATHETERS)
CATH FOLEY 2WAY SLVR  5CC 24FR (CATHETERS) ×1
CATH FOLEY 2WAY SLVR 5CC 20FR (CATHETERS) IMPLANT
CATH FOLEY 2WAY SLVR 5CC 22FR (CATHETERS) IMPLANT
CATH FOLEY 2WAY SLVR 5CC 24FR (CATHETERS) ×4 IMPLANT
CATH HEMA 3WAY 30CC 24FR COUDE (CATHETERS) ×5 IMPLANT
CATH HEMA 3WAY 30CC 24FR RND (CATHETERS) IMPLANT
CATH INTERMIT  6FR 70CM (CATHETERS) IMPLANT
CATH URET 5FR 28IN CONE TIP (BALLOONS)
CATH URET 5FR 28IN OPEN ENDED (CATHETERS) IMPLANT
CATH URET 5FR 70CM CONE TIP (BALLOONS) IMPLANT
CLOTH BEACON ORANGE TIMEOUT ST (SAFETY) ×5 IMPLANT
DRAPE CAMERA CLOSED 9X96 (DRAPES) ×5 IMPLANT
ELECT BUTTON BIOP 24F 90D PLAS (MISCELLANEOUS) IMPLANT
ELECT BUTTON HF 24-28F 2 30DE (ELECTRODE) IMPLANT
ELECT LOOP MED HF 24F 12D (CUTTING LOOP) ×10 IMPLANT
ELECT REM PT RETURN 9FT ADLT (ELECTROSURGICAL) ×5
ELECTRODE REM PT RTRN 9FT ADLT (ELECTROSURGICAL) ×4 IMPLANT
EVACUATOR MICROVAS BLADDER (UROLOGICAL SUPPLIES) IMPLANT
GLOVE BIO SURGEON STRL SZ7 (GLOVE) ×5 IMPLANT
GLOVE BIO SURGEON STRL SZ8 (GLOVE) IMPLANT
GLOVE ECLIPSE 7.0 STRL STRAW (GLOVE) ×5 IMPLANT
GLOVE INDICATOR 7.0 STRL GRN (GLOVE) ×5 IMPLANT
GLOVE INDICATOR 7.5 STRL GRN (GLOVE) ×5 IMPLANT
GOWN PREVENTION PLUS LG XLONG (DISPOSABLE) ×5 IMPLANT
GOWN STRL REIN XL XLG (GOWN DISPOSABLE) ×5 IMPLANT
GUIDEWIRE 0.038 PTFE COATED (WIRE) IMPLANT
GUIDEWIRE ANG ZIPWIRE 038X150 (WIRE) IMPLANT
GUIDEWIRE STR DUAL SENSOR (WIRE) ×10 IMPLANT
HOLDER FOLEY CATH W/STRAP (MISCELLANEOUS) IMPLANT
IV NS IRRIG 3000ML ARTHROMATIC (IV SOLUTION) ×20 IMPLANT
KIT ASPIRATION TUBING (SET/KITS/TRAYS/PACK) ×5 IMPLANT
KIT BALLIN UROMAX 15FX10 (LABEL) IMPLANT
KIT BALLN UROMAX 15FX4 (MISCELLANEOUS) IMPLANT
KIT BALLN UROMAX 26 75X4 (MISCELLANEOUS)
LASER FIBER DISP (UROLOGICAL SUPPLIES) IMPLANT
NS IRRIG 500ML POUR BTL (IV SOLUTION) ×5 IMPLANT
PACK CYSTOSCOPY (CUSTOM PROCEDURE TRAY) ×5 IMPLANT
PLUG CATH AND CAP STER (CATHETERS) IMPLANT
SET ASPIRATION TUBING (TUBING) IMPLANT
SET HIGH PRES BAL DIL (LABEL)
SHEATH ACCESS URETERAL 38CM (SHEATH) IMPLANT
SHEATH ACCESS URETERAL 54CM (SHEATH) IMPLANT
SHEATH URET ACCESS 12FR/35CM (UROLOGICAL SUPPLIES) ×5 IMPLANT
STENT CONTOUR 6FRX26X.038 (STENTS) IMPLANT
STENT URET 6FRX26 CONTOUR (STENTS) ×5 IMPLANT
SYRINGE IRR TOOMEY STRL 70CC (SYRINGE) ×5 IMPLANT
WATER STERILE IRR 3000ML UROMA (IV SOLUTION) ×10 IMPLANT

## 2012-01-24 NOTE — H&P (Signed)
Urology History and Physical Exam  CC: Bladder cancer  HPI: 51 year old male with a history of bladder cancer reports today for transurethral resection of bladder tumor with left ureteroscopy, left ureter biopsy, and left ureter stent placement. He was found to have a bladder tumor and underwent transurethral resection of bladder tumor in September 2013. This revealed high-grade, not muscle invasive urothelial carcinoma. Muscularis propria was present, but not involved. Surrounding areas that were biopsied revealed carcinoma in situ. He presents today for repeat TURBT to ensure accurate staging. He also is here for left ureteroscopy due to the proximity of the tumor to the left ureter orifice. He had upper tract imaging with a CT scan 11/15/11 which revealed no evidence of metastatic disease. We have discussed the risks, benefits, alternatives, and likelihood of achieving goals. Urine culture 01/10/12 revealed enterococcus. He was treated with ampicillin preoperatively and resumed his medication 2 days ago. He will receive Levaquin prior to his procedure.  PMH: Past Medical History  Diagnosis Date  . Gout     recent flair -bilateral feet--  STABLE  . Complication of anesthesia     depression after '07 surgery  . Bladder cancer   . Frequency of urination   . Urgency incontinence   . Nocturia   . Diabetes mellitus type 2, diet-controlled PT STOPPED TAKING METFORMIN--  HAS BEEN WATCHING DIET, EXERCISING AND LOSING WT    PSH: Past Surgical History  Procedure Date  . Ulner artery repair 2009    rt -trauma /thrombectomy,repair  . Transurethral resection of bladder tumor 12/15/2011    Procedure: TRANSURETHRAL RESECTION OF BLADDER TUMOR (TURBT);  Surgeon: Molli Hazard, MD;  Location: [facility];  Service: Urology;  Laterality: N/A;  2 HRS Procedure reads: cysto, TURBT , Bilateral retrograde pyelograms, poss bilateral ureteroscopy with biopsy and ureter stent placement   .  Lumbar laminectomy 06-02-2005    W/ RESECTION NERVE ROOT  L4  -  L5    Allergies: No Known Allergies  Medications: No prescriptions prior to admission     Social History: History   Social History  . Marital Status: Married    Spouse Name: N/A    Number of Children: N/A  . Years of Education: N/A   Occupational History  . Not on file.   Social History Main Topics  . Smoking status: Former Smoker -- 1.0 packs/day for 16 years    Types: Cigarettes    Quit date: 01/20/1992  . Smokeless tobacco: Former Systems developer    Types: [place] date: 01/20/1992  . Alcohol Use: No  . Drug Use: No  . Sexually Active: Not on file   Other Topics Concern  . Not on file   Social History Narrative  . No narrative on file    Family History: History reviewed. No pertinent family history.  Review of Systems: Positive: None. Negative: Chest pain, SOB, fever.  A further 10 point review of systems was negative except what is listed in the HPI.  Physical Exam: Filed Vitals:   01/24/12 1120  BP: 171/90  Pulse: 64  Temp: 97.1 F (36.2 C)  Resp: 18    General: No acute distress.  Awake. Head:  Normocephalic.  Atraumatic. ENT:  EOMI.  Mucous membranes moist Neck:  Supple.  No lymphadenopathy. Pulmonary: Equal effort bilaterally.  Clear to auscultation bilaterally. Abdomen: Soft.  Non- tender to palpation. Skin:  Normal turgor.  No visible rash. Extremity: No gross deformity of bilateral upper extremities.  No gross deformity of    bilateral lower extremities. Neurologic: Alert. Appropriate mood.    Studies:  No results found for this basename: HGB:2,WBC:2,PLT:2 in the last 72 hours  No results found for this basename: NA:2,K:2,CL:2,CO2:2,BUN:2,CREATININE:2,CALCIUM:2,MAGNESIUM:2,GFRNONAA:2,GFRAA:2 in the last 72 hours   No results found for this basename: PT:2,INR:2,APTT:2 in the last 72 hours   No components found with this basename: ABG:2    Assessment:  Bladder  cancer.  Plan: To OR for transurethral resection of bladder tumor with left ureteroscopy, left ureter biopsy, and left ureter stent placement.

## 2012-01-24 NOTE — Anesthesia Postprocedure Evaluation (Signed)
  Anesthesia Post-op Note  Patient: [name] [name]  Procedure(s) Performed: Procedure(s) (LRB): TRANSURETHRAL RESECTION OF BLADDER TUMOR (TURBT) (N/A) CYSTOSCOPY WITH URETHRAL DILATATION (N/A) CYSTOSCOPY WITH RETROGRADE PYELOGRAM, URETEROSCOPY AND STENT PLACEMENT (Left)  Patient Location: PACU  Anesthesia Type: General  Level of Consciousness: awake and alert   Airway and Oxygen Therapy: Patient Spontanous Breathing  Post-op Pain: mild  Post-op Assessment: Post-op Vital signs reviewed, Patient's Cardiovascular Status Stable, Respiratory Function Stable, Patent Airway and No signs of Nausea or vomiting  Post-op Vital Signs: stable  Complications: No apparent anesthesia complications

## 2012-01-24 NOTE — Anesthesia Procedure Notes (Signed)
Procedure Name: LMA Insertion Date/Time: 01/24/2012 1:25 PM Performed by: Mechele Claude Pre-anesthesia Checklist: Patient identified, Emergency Drugs available, Suction available and Patient being monitored Patient Re-evaluated:Patient Re-evaluated prior to inductionOxygen Delivery Method: Circle System Utilized Preoxygenation: Pre-oxygenation with 100% oxygen Intubation Type: IV induction Ventilation: Mask ventilation without difficulty LMA: LMA inserted LMA Size: 4.0 Number of attempts: 1 Airway Equipment and Method: bite block Placement Confirmation: positive ETCO2 Tube secured with: Tape Dental Injury: Teeth and Oropharynx as per pre-operative assessment

## 2012-01-24 NOTE — Brief Op Note (Signed)
01/24/2012  2:32 PM  PATIENT:  [name] [name]  51 y.o. male  PRE-OPERATIVE DIAGNOSIS:  BLADDER CANCER  POST-OPERATIVE DIAGNOSIS:  BLADDER CANCER  PROCEDURE:  Procedure(s) (LRB) with comments: TRANSURETHRAL RESECTION OF BLADDER TUMOR (TURBT) (N/A) - 90 MIN ADD LEFT URETER BIOPSY TO PROCEDURE   CYSTOSCOPY WITH URETHRAL DILATATION (N/A) CYSTOSCOPY WITH RETROGRADE PYELOGRAM, URETEROSCOPY AND STENT PLACEMENT (Left)  SURGEON:  Surgeon(s) and Role:    * Molli Hazard, MD - Primary  PHYSICIAN ASSISTANT:   ASSISTANTS: none   ANESTHESIA:   general  EBL: None Total I/O In: 200 [I.V.:200] Out: -   BLOOD ADMINISTERED:none  DRAINS: Urinary Catheter (Foley)   LOCAL MEDICATIONS USED:  LIDOCAINE , Amount: 10 ml urethral jelly and OTHER B&O suppository  SPECIMEN:  Source of Specimen:  Left ureter, bladder.  DISPOSITION OF SPECIMEN:  PATHOLOGY  COUNTS:  YES  TOURNIQUET:  * No tourniquets in log *  DICTATION: .Other Dictation: Dictation Number [id number]  PLAN OF CARE: Discharge to home after PACU  PATIENT DISPOSITION:  PACU - hemodynamically stable.   Delay start of Pharmacological VTE agent (>24hrs) due to surgical blood loss or risk of bleeding: yes

## 2012-01-24 NOTE — Anesthesia Preprocedure Evaluation (Addendum)
Anesthesia Evaluation  Patient identified by MRN, date of birth, ID band Patient awake    Reviewed: Allergy & Precautions, H&P , NPO status , Patient's Chart, lab work & pertinent test results  Airway Mallampati: II TM Distance: >3 FB Neck ROM: Full    Dental No notable dental hx.    Pulmonary neg pulmonary ROS,  breath sounds clear to auscultation  Pulmonary exam normal       Cardiovascular Exercise Tolerance: Good negative cardio ROS  Rhythm:Regular Rate:Normal  ECG: normal   Neuro/Psych negative neurological ROS  negative psych ROS   GI/Hepatic negative GI ROS, Neg liver ROS,   Endo/Other  diabetes, Type 2, Oral Hypoglycemic Agents  Renal/GU negative Renal ROS  negative genitourinary   Musculoskeletal negative musculoskeletal ROS (+)   Abdominal (+) + obese,   Peds negative pediatric ROS (+)  Hematology negative hematology ROS (+)   Anesthesia Other Findings   Reproductive/Obstetrics negative OB ROS                          Anesthesia Physical Anesthesia Plan  ASA: II  Anesthesia Plan: General   Post-op Pain Management:    Induction: Intravenous  Airway Management Planned: LMA  Additional Equipment:   Intra-op Plan:   Post-operative Plan: Extubation in OR  Informed Consent: I have reviewed the patients History and Physical, chart, labs and discussed the procedure including the risks, benefits and alternatives for the proposed anesthesia with the patient or authorized representative who has indicated his/her understanding and acceptance.   Dental advisory given  Plan Discussed with: CRNA  Anesthesia Plan Comments:         Anesthesia Quick Evaluation

## 2012-01-24 NOTE — Transfer of Care (Signed)
Immediate Anesthesia Transfer of Care Note  Patient: [name] [name]  Procedure(s) Performed: Procedure(s) (LRB): TRANSURETHRAL RESECTION OF BLADDER TUMOR (TURBT) (N/A) CYSTOSCOPY WITH URETHRAL DILATATION (N/A) CYSTOSCOPY WITH RETROGRADE PYELOGRAM, URETEROSCOPY AND STENT PLACEMENT (Left)  Patient Location: PACU  Anesthesia Type: General  Level of Consciousness:drowsy, follows commands,   Airway & Oxygen Therapy: Patient Spontanous Breathing and Patient connected to face mask oxygen  Post-op Assessment: Report given to PACU RN and Post -op Vital signs reviewed and stable  Post vital signs: Reviewed and stable  Complications: No apparent anesthesia complications

## 2012-01-25 NOTE — Op Note (Signed)
NAMEKENDRELL, LAKEY NO.:  [id number]  MEDICAL RECORD NO.:  [id number]  LOCATION:                                 FACILITY:  PHYSICIAN:  Rolan Bucco, MD         DATE OF BIRTH:  DATE OF PROCEDURE:  01/24/2012 DATE OF DISCHARGE:                              OPERATIVE REPORT   SURGEON:  Rolan Bucco, MD  ASSISTANT:  None.  PREOPERATIVE DIAGNOSIS:  Bladder cancer.  POSTOPERATIVE DIAGNOSIS:  Bladder cancer.  PROCEDURES PERFORMED: 1. Cystoscopy. 2. Left ureteroscopy. 3. Left ureter biopsy. 4. Left ureter stent placement. 5. Transurethral resection of bladder tumor (small, >0.5 cm). 6. Foley catheter placement. 7. Meatal dilation.  COMPLICATIONS:  None.  DRAINS:  Foley catheter.  ESTIMATED BLOOD LOSS:  Minimal.  SPECIMEN:  Left ureter biopsy.  Left periureteral bladder tissue. Bladder biopsy.  HISTORY OF PRESENT ILLNESS:  A 51 year old gentleman who has a history of high-grade urothelial carcinoma of the bladder.  He was found to have this as well as carcinoma in situ of the bladder.  This was not muscle invasive and there was muscle in the specimen.  Because of the proximity to the left ureteral orifice, I recommended left ureteroscopy with biopsy to ensure there was no CIS involvement of his distal ureter.  I also recommended repeating bladder biopsies and transurethral resection of bladder tumor in order to ensure that we have accurate staging.  He presented today for that procedure.  PROCEDURE:  After informed consent was obtained, the patient was taken to the operating room, he was placed in the supine position.  IV antibiotics were infused and general anesthesia was induced.  He was placed in dorsal lithotomy position, making sure to pad all pertinent neurovascular pressure points appropriately.  SCDs were turned on and in place.  Genitals were then prepped and draped in usual sterile fashion. Time-out was performed which showed the  correct patient, surgical site, procedure identified and agreed upon by the team.  Rigid cystoscope was attempted to be advanced through the urethra, but it is noted that he had meatal stenosis that prevented this.  This was also noted at last visit.  I performed meatal dilation with male sounds with lubrication from 22-French to 26-French.  This allowed me to pass the rigid cystoscope through the urethra into the bladder.  It was noted that at several points in his bulbar and anterior urethra, he did have mild strictures that were able be passed with no problems.  Once in the bladder, the bladder was distended fully and evaluated with a 12 degree and 70-degree lens.  There were no new bladder tumors noted.  The previous resection site was noted on the left bladder wall and bladder floor.  Next it was elected to place a sensor tip wire up the left ureteral orifice.  This was placed with ease with fluoroscopy with good curl noted in the left renal pelvis.  I then attempted to place a semi- rigid ureteroscope after the patient was paralyzed but this was unsuccessful.  Therefore, I placed a second sensor tip wire and then I was able to successfully place a 12-14 ureter access sheath obturator  which would have been a 12-French dilator over the wire with ease. After this, I was able to pass the rigid cystoscope into the distal ureter.  There was no erythema or tumors noted in the distal ureter. Biopsy forceps were used to biopsy th ureter and these were sent for pathology.  After this was completed, the rigid cystoscope was removed.  Further rigid ureteroscope was removed and a cystoscope was placed back over the safety wire and a 6 x 24 double-J stent was placed up the ureter with ease with a good curl noted in the left renal pelvis.  Next the cystoscope was removed and visual obturator to the gyrus resection scope was placed and then loop device for the gyrus was used to perform  transurethral resection of bladder tumor on the tissue surrounding the ureter to remove the distal ureteral orifice. This was >0.5 cm and less then 2 cm.  This was sent for permanent pathology.  Hemostasis maintained with delicate electrocautery around the ureteral orifice and the indwelling stent. After this was done, cold cup biopsy forceps were used to biopsy the edges of the previous resection site as well as the resection bed. After this was done, hemostasis was maintained with electrocautery from the gyrus resectoscope.  After this was done, the entire urethra was visualized when removing the resectoscope.  There was no damage to the urethra noted.  Next 10 mL of lidocaine jelly were placed into the urethra and a Coude tip 20-French catheter was placed with 10 mL placed into the balloon.  The belladonna and opium suppository was placed into the rectum and rectal examination was performed.  He has a benign prostate with no nodules or masses.  This completed the procedure.  He was placed back in a supine position.  Anesthesia was reversed and he was taken to the PACU in stable condition.  He will follow up as an outpatient for catheter removal and left ureter stent removal.  He will be given antibiotics to cover for the instrumentation.          ______________________________ Rolan Bucco, MD     DW/MEDQ  D:  01/24/2012  T:  01/25/2012  Job:  [id number]

## 2012-01-26 ENCOUNTER — Encounter (HOSPITAL_BASED_OUTPATIENT_CLINIC_OR_DEPARTMENT_OTHER): Payer: Self-pay | Admitting: Urology

## 2012-06-30 ENCOUNTER — Other Ambulatory Visit (HOSPITAL_COMMUNITY): Payer: Self-pay | Admitting: Urology

## 2012-06-30 DIAGNOSIS — N281 Cyst of kidney, acquired: Secondary | ICD-10-CM

## 2012-07-03 ENCOUNTER — Other Ambulatory Visit (HOSPITAL_COMMUNITY): Payer: Self-pay | Admitting: Urology

## 2012-07-03 ENCOUNTER — Other Ambulatory Visit: Payer: Self-pay | Admitting: Urology

## 2012-07-03 ENCOUNTER — Ambulatory Visit (HOSPITAL_COMMUNITY)
Admission: RE | Admit: 2012-07-03 | Discharge: 2012-07-03 | Disposition: A | Discharge status: Home / Self Care | Payer: BC Managed Care – PPO | Source: Ambulatory Visit | Attending: Urology | Admitting: Urology

## 2012-07-03 DIAGNOSIS — N281 Cyst of kidney, acquired: Secondary | ICD-10-CM

## 2012-07-03 DIAGNOSIS — C679 Malignant neoplasm of bladder, unspecified: Secondary | ICD-10-CM | POA: Insufficient documentation

## 2012-07-03 DIAGNOSIS — K7689 Other specified diseases of liver: Secondary | ICD-10-CM | POA: Insufficient documentation

## 2012-07-03 DIAGNOSIS — Q619 Cystic kidney disease, unspecified: Secondary | ICD-10-CM | POA: Insufficient documentation

## 2012-07-03 DIAGNOSIS — R110 Nausea: Secondary | ICD-10-CM | POA: Insufficient documentation

## 2012-07-03 MED ORDER — GADOBENATE DIMEGLUMINE 529 MG/ML IV SOLN
20.0000 mL | Freq: Once | INTRAVENOUS | Status: AC | PRN
  Administered 2012-07-03: 20 mL via INTRAVENOUS

## 2012-07-04 ENCOUNTER — Encounter (HOSPITAL_BASED_OUTPATIENT_CLINIC_OR_DEPARTMENT_OTHER): Payer: Self-pay | Admitting: *Deleted

## 2012-07-04 NOTE — Progress Notes (Addendum)
To [facility] at 1145-Istat on arrival,Ekg in epic-instructed to call us back with name of BP medication-Npo after Mn-may take celexa with small amt water that am,aware need name of BP med so can instruct him if  able to take prior to surgery.  PT CALLED BACK  W/ BP MED UPDATED MED. LIST.  PT WILL NOT TAKE ANY MEDS.

## 2012-07-10 ENCOUNTER — Ambulatory Visit (HOSPITAL_BASED_OUTPATIENT_CLINIC_OR_DEPARTMENT_OTHER): Payer: BC Managed Care – PPO | Admitting: Anesthesiology

## 2012-07-10 ENCOUNTER — Ambulatory Visit (HOSPITAL_COMMUNITY): Payer: BC Managed Care – PPO

## 2012-07-10 ENCOUNTER — Encounter (HOSPITAL_BASED_OUTPATIENT_CLINIC_OR_DEPARTMENT_OTHER): Payer: Self-pay

## 2012-07-10 ENCOUNTER — Encounter (HOSPITAL_BASED_OUTPATIENT_CLINIC_OR_DEPARTMENT_OTHER): Payer: Self-pay | Admitting: Anesthesiology

## 2012-07-10 ENCOUNTER — Ambulatory Visit (HOSPITAL_BASED_OUTPATIENT_CLINIC_OR_DEPARTMENT_OTHER)
Admission: RE | Admit: 2012-07-10 | Discharge: 2012-07-10 | Disposition: A | Discharge status: Home / Self Care | Payer: BC Managed Care – PPO | Source: Ambulatory Visit | Attending: Urology | Admitting: Urology

## 2012-07-10 ENCOUNTER — Encounter (HOSPITAL_BASED_OUTPATIENT_CLINIC_OR_DEPARTMENT_OTHER)
Admission: RE | Disposition: A | Discharge status: Home / Self Care | Payer: Self-pay | Source: Ambulatory Visit | Attending: Urology

## 2012-07-10 DIAGNOSIS — E119 Type 2 diabetes mellitus without complications: Secondary | ICD-10-CM | POA: Insufficient documentation

## 2012-07-10 DIAGNOSIS — D090 Carcinoma in situ of bladder: Secondary | ICD-10-CM | POA: Insufficient documentation

## 2012-07-10 DIAGNOSIS — C679 Malignant neoplasm of bladder, unspecified: Secondary | ICD-10-CM

## 2012-07-10 DIAGNOSIS — Z79899 Other long term (current) drug therapy: Secondary | ICD-10-CM | POA: Insufficient documentation

## 2012-07-10 HISTORY — PX: CYSTOSCOPY W/ RETROGRADES: SHX1426

## 2012-07-10 HISTORY — PX: TRANSURETHRAL RESECTION OF BLADDER TUMOR: SHX2575

## 2012-07-10 LAB — GLUCOSE, CAPILLARY: Glucose-Capillary: 158 mg/dL — ABNORMAL HIGH (ref 70–99)

## 2012-07-10 LAB — POCT I-STAT 4, (NA,K, GLUC, HGB,HCT): Glucose, Bld: 173 mg/dL — ABNORMAL HIGH (ref 70–99)

## 2012-07-10 SURGERY — TURBT (TRANSURETHRAL RESECTION OF BLADDER TUMOR)
Anesthesia: General | Site: Ureter | Wound class: Clean Contaminated

## 2012-07-10 MED ORDER — FENTANYL CITRATE 0.05 MG/ML IJ SOLN
INTRAMUSCULAR | Status: DC | PRN
  Administered 2012-07-10: 50 ug via INTRAVENOUS
  Administered 2012-07-10 (×2): 25 ug via INTRAVENOUS
  Administered 2012-07-10: 50 ug via INTRAVENOUS
  Administered 2012-07-10 (×2): 25 ug via INTRAVENOUS

## 2012-07-10 MED ORDER — LIDOCAINE HCL (CARDIAC) 20 MG/ML IV SOLN
INTRAVENOUS | Status: DC | PRN
  Administered 2012-07-10: 75 mg via INTRAVENOUS

## 2012-07-10 MED ORDER — MIDAZOLAM HCL 5 MG/5ML IJ SOLN
INTRAMUSCULAR | Status: DC | PRN
  Administered 2012-07-10: 2 mg via INTRAVENOUS

## 2012-07-10 MED ORDER — CEFAZOLIN SODIUM-DEXTROSE 2-3 GM-% IV SOLR
2.0000 g | INTRAVENOUS | Status: AC
  Administered 2012-07-10: 2 g via INTRAVENOUS
  Filled 2012-07-10: qty 50

## 2012-07-10 MED ORDER — PROPOFOL 10 MG/ML IV BOLUS
INTRAVENOUS | Status: DC | PRN
Start: 2012-07-10 — End: 2012-07-10
  Administered 2012-07-10: 250 mg via INTRAVENOUS
  Administered 2012-07-10: 20 mg via INTRAVENOUS

## 2012-07-10 MED ORDER — PHENAZOPYRIDINE HCL 100 MG PO TABS: 100.0000 mg | ORAL_TABLET | Freq: Three times a day (TID) | ORAL | Status: DC | PRN

## 2012-07-10 MED ORDER — STERILE WATER FOR IRRIGATION IR SOLN
Status: DC | PRN
  Administered 2012-07-10: 9000 mL

## 2012-07-10 MED ORDER — BELLADONNA ALKALOIDS-OPIUM 16.2-60 MG RE SUPP
RECTAL | Status: DC | PRN
  Administered 2012-07-10: 1 via RECTAL

## 2012-07-10 MED ORDER — ONDANSETRON HCL 4 MG/2ML IJ SOLN
INTRAMUSCULAR | Status: DC | PRN
  Administered 2012-07-10: 4 mg via INTRAVENOUS

## 2012-07-10 MED ORDER — IOHEXOL 350 MG/ML SOLN
INTRAVENOUS | Status: DC | PRN
  Administered 2012-07-10: 12 mL via INTRAVENOUS

## 2012-07-10 MED ORDER — HYDROCODONE-ACETAMINOPHEN 5-325 MG PO TABS: 1.0000 | ORAL_TABLET | ORAL | Status: DC | PRN

## 2012-07-10 MED ORDER — FENTANYL CITRATE 0.05 MG/ML IJ SOLN
25.0000 ug | INTRAMUSCULAR | Status: DC | PRN
  Filled 2012-07-10: qty 1

## 2012-07-10 MED ORDER — DEXAMETHASONE SODIUM PHOSPHATE 4 MG/ML IJ SOLN
INTRAMUSCULAR | Status: DC | PRN
  Administered 2012-07-10: 8 mg via INTRAVENOUS

## 2012-07-10 MED ORDER — GLYCOPYRROLATE 0.2 MG/ML IJ SOLN
INTRAMUSCULAR | Status: DC | PRN
  Administered 2012-07-10: 0.2 mg via INTRAVENOUS

## 2012-07-10 MED ORDER — LACTATED RINGERS IV SOLN
INTRAVENOUS | Status: DC
  Administered 2012-07-10 (×2): via INTRAVENOUS
  Administered 2012-07-10: 100 mL/h via INTRAVENOUS
  Filled 2012-07-10: qty 1000

## 2012-07-10 MED ORDER — PROMETHAZINE HCL 25 MG/ML IJ SOLN
6.2500 mg | INTRAMUSCULAR | Status: DC | PRN
  Filled 2012-07-10: qty 1

## 2012-07-10 MED ORDER — KETOROLAC TROMETHAMINE 30 MG/ML IJ SOLN
15.0000 mg | Freq: Once | INTRAMUSCULAR | Status: DC | PRN
  Filled 2012-07-10: qty 1

## 2012-07-10 MED ORDER — LIDOCAINE HCL 2 % EX GEL
CUTANEOUS | Status: DC | PRN
  Administered 2012-07-10: 1 via URETHRAL

## 2012-07-10 SURGICAL SUPPLY — 27 items
ADAPTER CATH URET PLST 4-6FR (CATHETERS) ×4 IMPLANT
ADPR CATH URET STRL DISP 4-6FR (CATHETERS) ×3
BAG DRAIN URO-CYSTO SKYTR STRL (DRAIN) ×4 IMPLANT
BAG DRN UROCATH (DRAIN) ×3
BRUSH URET BIOPSY 3F (UROLOGICAL SUPPLIES) IMPLANT
CANISTER SUCT LVC 12 LTR MEDI- (MISCELLANEOUS) ×4 IMPLANT
CATH INTERMIT  6FR 70CM (CATHETERS) IMPLANT
CATH URET 5FR 28IN CONE TIP (BALLOONS)
CATH URET 5FR 28IN OPEN ENDED (CATHETERS) ×4 IMPLANT
CATH URET 5FR 70CM CONE TIP (BALLOONS) IMPLANT
CLOTH BEACON ORANGE TIMEOUT ST (SAFETY) ×4 IMPLANT
DRAPE CAMERA CLOSED 9X96 (DRAPES) ×4 IMPLANT
ELECT REM PT RETURN 9FT ADLT (ELECTROSURGICAL) ×4
ELECTRODE REM PT RTRN 9FT ADLT (ELECTROSURGICAL) ×3 IMPLANT
GLOVE BIO SURGEON STRL SZ 6.5 (GLOVE) ×4 IMPLANT
GLOVE BIO SURGEON STRL SZ7 (GLOVE) ×4 IMPLANT
GLOVE ECLIPSE 6.0 STRL STRAW (GLOVE) ×4 IMPLANT
GLOVE INDICATOR 7.5 STRL GRN (GLOVE) IMPLANT
GOWN PREVENTION PLUS LG XLONG (DISPOSABLE) ×4 IMPLANT
GUIDEWIRE 0.038 PTFE COATED (WIRE) IMPLANT
GUIDEWIRE ANG ZIPWIRE 038X150 (WIRE) IMPLANT
GUIDEWIRE STR DUAL SENSOR (WIRE) ×4 IMPLANT
IV NS IRRIG 3000ML ARTHROMATIC (IV SOLUTION) ×4 IMPLANT
LASER FIBER DISP (UROLOGICAL SUPPLIES) IMPLANT
PACK CYSTOSCOPY (CUSTOM PROCEDURE TRAY) ×4 IMPLANT
SYRINGE IRR TOOMEY STRL 70CC (SYRINGE) IMPLANT
WATER STERILE IRR 3000ML UROMA (IV SOLUTION) ×4 IMPLANT

## 2012-07-10 NOTE — Anesthesia Preprocedure Evaluation (Addendum)
Anesthesia Evaluation  Patient identified by MRN, date of birth, ID band Patient awake    Reviewed: Allergy & Precautions, H&P , NPO status , Patient's Chart, lab work & pertinent test results  Airway Mallampati: II TM Distance: >3 FB Neck ROM: Full    Dental no notable dental hx.    Pulmonary neg pulmonary ROS, former smoker,  breath sounds clear to auscultation  Pulmonary exam normal       Cardiovascular Exercise Tolerance: Good hypertension, Pt. on medications negative cardio ROS  Rhythm:Regular Rate:Normal     Neuro/Psych negative neurological ROS  negative psych ROS   GI/Hepatic negative GI ROS, Neg liver ROS, hiatal hernia,   Endo/Other  diabetes, Type 2, Oral Hypoglycemic Agents  Renal/GU negative Renal ROS  negative genitourinary   Musculoskeletal negative musculoskeletal ROS (+)   Abdominal (+) + obese,   Peds negative pediatric ROS (+)  Hematology negative hematology ROS (+)   Anesthesia Other Findings   Reproductive/Obstetrics negative OB ROS                          Anesthesia Physical  Anesthesia Plan  ASA: II  Anesthesia Plan: General   Post-op Pain Management:    Induction: Intravenous  Airway Management Planned: LMA  Additional Equipment:   Intra-op Plan:   Post-operative Plan: Extubation in OR  Informed Consent: I have reviewed the patients History and Physical, chart, labs and discussed the procedure including the risks, benefits and alternatives for the proposed anesthesia with the patient or authorized representative who has indicated his/her understanding and acceptance.   Dental advisory given  Plan Discussed with: CRNA  Anesthesia Plan Comments:         Anesthesia Quick Evaluation

## 2012-07-10 NOTE — Anesthesia Postprocedure Evaluation (Signed)
  Anesthesia Post-op Note  Patient: [name] [name]  Procedure(s) Performed: Procedure(s) (LRB): TRANSURETHRAL RESECTION OF BLADDER TUMOR (TURBT) (N/A) CYSTOSCOPY WITH RETROGRADE PYELOGRAM (Bilateral)  Patient Location: PACU  Anesthesia Type: General  Level of Consciousness: awake and alert   Airway and Oxygen Therapy: Patient Spontanous Breathing  Post-op Pain: mild  Post-op Assessment: Post-op Vital signs reviewed, Patient's Cardiovascular Status Stable, Respiratory Function Stable, Patent Airway and No signs of Nausea or vomiting  Last Vitals:  Filed Vitals:   07/10/12 1403  BP: 160/90  Pulse:   Temp: 36.7 C  Resp: 12    Post-op Vital Signs: stable   Complications: No apparent anesthesia complications

## 2012-07-10 NOTE — Op Note (Signed)
Urology Operative Report  Date of Procedure: 07/10/12  Surgeon: Rolan Bucco, MD Assistant: None  Preoperative Diagnosis: Bladder cancer Postoperative Diagnosis:  Same  Procedure(s): Transurethral resection of bladder tumor (small, >0.5 cm, <2 cm) Bladder biopsy Prostatic urethral biopsy Bilateral retrograde pyelograms Cystoscopy  Estimated blood loss: Minimal  Specimen: Bladder tumor, bladder biopsy, prostatic urethral biopsy.  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   Procedure in detail: After informed consent was obtained, the patient was taken to the operating room. They were placed in the supine position. SCDs were turned on and in place. IV antibiotics were infused, and general anesthesia was induced. A timeout was performed in which the correct patient, surgical site, and procedure were identified and agreed upon by the team.  The patient was placed in a dorsolithotomy position, making sure to pad all pertinent neurovascular pressure points. The genitals were prepped and draped in the usual sterile fashion.   A rigid cystoscope was advanced through the urethra and into the bladder. The bladder was fully distended and evaluated in a systematic fashion. There was noted to be a sessile erythematous bladder tumor on the right posterior bladder wall. This was approximately once the meter in size. There were other areas of erythema including on the bladder floor, posterior bladder wall, and anterior bladder wall at the bladder neck at approximately the 1:00 position. There was clear urine effluxing from the ureters bilaterally. The left ureter was ectopic due to previous transurethral bladder resection.  Attention was turned to the sessile bladder tumor at  the right posterior bladder wall. This was removed in its entirety with cold cup biopsy forceps. A deep margin was also sent for pathology. The edges and base were then fulgurated with Bugbee electrode to maintain hemostasis. Attention was then turned to the bladder floor which was biopsied with cold cup biopsy forceps and the base and edges fulgurated with the Bugbee electrode. The posterior midline bladder was biopsied and fulgurated at the biopsy site. Then attention was turned to the bladder neck which was biopsied and fulgurated. It was difficult to reach this area due to its position and poor visualization with a 30 lens. I then biopsied an area in the prostatic urethra with minimal fulguration here. There was good hemostasis.  The right ureter was cannulated with a 5 French catheter and I injected contrast to obtain a retrograde pyelogram. There were no filling defects in the ureter or upper collecting system. I cannulated the left ureter orifice and injected contrast to obtain a left retrograde pyelogram. There were no filling defects in the ureter or the upper collecting system.  The bladder was drained, and I placed 10 cc of lidocaine jelly into his urethra. A belladonna and opium suppository was placed into his rectum. He was placed in a supine position, anesthesia was reversed, and he was taken to the PACU in stable condition.  He will followup with me this Friday to review the pathology.

## 2012-07-10 NOTE — Transfer of Care (Signed)
Immediate Anesthesia Transfer of Care Note  Patient: [name] [name]  Procedure(s) Performed: Procedure(s) (LRB): TRANSURETHRAL RESECTION OF BLADDER TUMOR (TURBT) (N/A) CYSTOSCOPY WITH RETROGRADE PYELOGRAM (Bilateral)  Patient Location: Patient transported to PACU with oxygen via face mask at 4 Liters / Min  Anesthesia Type: General  Level of Consciousness: awake and alert   Airway & Oxygen Therapy: Patient Spontanous Breathing and Patient connected to face mask oxygen  Post-op Assessment: Report given to PACU RN and Post -op Vital signs reviewed and stable  Post vital signs: Reviewed and stable  Dentition: Teeth and oropharynx remain in pre-op condition  Complications: No apparent anesthesia complications, Small amount of emesis via LMA, SX, Patient awake, reflexes intact.

## 2012-07-10 NOTE — H&P (Signed)
Urology History and Physical Exam  CC: Bladder cancer  HPI: 52 year old male has a history of high-grade urothelial carcinoma of the bladder. This was not muscle invasive. It was discovered in September 2013. He also had carcinoma in situ surrounding his tumor on initial diagnosis. Repeat biopsy in October 2013 was negative. He underwent BCG induction which was completed in February 2014. Repeat cystoscopy in March 2014 revealed approximately 3 different areas of erythema. These were located at the bladder neck, right posterior bladder wall, and midposterior bladder. These were not papillary in nature. There are concerning for bladder cancer. They were not associated with gross hematuria. He presents today for cystoscopy, bladder biopsy, bilateral retrograde pyelograms, possible bilateral ureteroscopy with biopsy and ureter stent placement. We have discussed the risks, benefits, alternatives, and likelihood of achieving goals. UA 06/30/12 was negative for signs of infection.  PMH: Past Medical History  Diagnosis Date  . Gout     recent flair -bilateral feet--  STABLE  . Frequency of urination   . Urgency incontinence   . Nocturia   . Diabetes mellitus type 2, diet-controlled PT STOPPED TAKING METFORMIN--  HAS BEEN WATCHING DIET, EXERCISING AND LOSING WT  . Bladder cancer 2013  . Complication of anesthesia     agitation w/awakening in 9/13,OK 01/24/12    PSH: Past Surgical History  Procedure Laterality Date  . Ulner artery repair  2009    rt -trauma /thrombectomy,repair  . Transurethral resection of bladder tumor  12/15/2011    Procedure: TRANSURETHRAL RESECTION OF BLADDER TUMOR (TURBT);  Surgeon: Molli Hazard, MD;  Location: [facility];  Service: Urology;  Laterality: N/A;  2 HRS Procedure reads: cysto, TURBT , Bilateral retrograde pyelograms, poss bilateral ureteroscopy with biopsy and ureter stent placement   . Lumbar laminectomy  06-02-2005    W/ RESECTION  NERVE ROOT  L4  -  L5  . Transurethral resection of bladder tumor  01/24/2012    Procedure: TRANSURETHRAL RESECTION OF BLADDER TUMOR (TURBT);  Surgeon: Molli Hazard, MD;  Location: [facility];  Service: Urology;  Laterality: N/A;  90 MIN ADD LEFT URETER BIOPSY TO PROCEDURE    . Cystoscopy with urethral dilatation  01/24/2012    Procedure: CYSTOSCOPY WITH URETHRAL DILATATION;  Surgeon: Molli Hazard, MD;  Location: [facility];  Service: Urology;  Laterality: N/A;    Allergies: No Known Allergies  Medications: No prescriptions prior to admission     Social History: History   Social History  . Marital Status: Married    Spouse Name: N/A    Number of Children: N/A  . Years of Education: N/A   Occupational History  . Not on file.   Social History Main Topics  . Smoking status: Former Smoker -- 1.00 packs/day for 16 years    Types: Cigarettes    Quit date: 01/20/1992  . Smokeless tobacco: Former Systems developer    Types: [place] date: 01/20/1992  . Alcohol Use: No  . Drug Use: No  . Sexually Active: Not on file   Other Topics Concern  . Not on file   Social History Narrative  . No narrative on file    Family History: History reviewed. No pertinent family history.  Review of Systems: Positive: None Negative: Fever, chest pain, or SOB.  A further 10 point review of systems was negative except what is listed in the HPI.  Physical Exam: Filed Vitals:   07/10/12 1158  BP: 142/86  Pulse: 67  Temp: 97 F (36.1 C)  Resp: 20    General: No acute distress.  Awake. Head:  Normocephalic.  Atraumatic. ENT:  EOMI.  Mucous membranes moist Neck:  Supple.  No lymphadenopathy. CV:  S1 present. S2 present. Regular rate. Pulmonary: Equal effort bilaterally.  Clear to auscultation bilaterally. Abdomen: Soft.  Non- tender to palpation. Skin:  Normal turgor.  No visible rash. Extremity: No gross deformity of bilateral upper  extremities.  No gross deformity of    bilateral lower extremities. Neurologic: Alert. Appropriate mood.    Studies:  No results found for this basename: HGB, WBC, PLT,  in the last 72 hours  No results found for this basename: NA, K, CL, CO2, BUN, CREATININE, CALCIUM, MAGNESIUM, GFRNONAA, GFRAA,  in the last 72 hours   No results found for this basename: PT, INR, APTT,  in the last 72 hours   No components found with this basename: ABG,     Assessment:  Bladder cancer  Plan: To OR for cystoscopy, bladder biopsy, bilateral retrograde pyelograms, possible bilateral ureteroscopy with biopsy and ureter stent placement.

## 2012-07-10 NOTE — Anesthesia Procedure Notes (Signed)
Procedure Name: LMA Insertion Date/Time: 07/10/2012 12:55 PM Performed by: Christiana Fuchs Pre-anesthesia Checklist: Patient identified, Emergency Drugs available, Suction available and Patient being monitored Patient Re-evaluated:Patient Re-evaluated prior to inductionOxygen Delivery Method: Circle System Utilized Preoxygenation: Pre-oxygenation with 100% oxygen Intubation Type: IV induction Ventilation: Mask ventilation without difficulty LMA: LMA inserted LMA Size: 4.0 Number of attempts: 1 Airway Equipment and Method: bite block Placement Confirmation: positive ETCO2 Tube secured with: Tape Dental Injury: Teeth and Oropharynx as per pre-operative assessment

## 2012-07-11 ENCOUNTER — Encounter (HOSPITAL_BASED_OUTPATIENT_CLINIC_OR_DEPARTMENT_OTHER): Payer: Self-pay | Admitting: Urology

## 2012-10-21 ENCOUNTER — Ambulatory Visit: Payer: BC Managed Care – PPO

## 2012-10-21 ENCOUNTER — Ambulatory Visit (INDEPENDENT_AMBULATORY_CARE_PROVIDER_SITE_OTHER): Payer: BC Managed Care – PPO | Admitting: Family Medicine

## 2012-10-21 VITALS — BP 130/64 | HR 87 | Temp 97.8°F | Resp 18 | Ht 70.0 in | Wt 210.0 lb

## 2012-10-21 DIAGNOSIS — M549 Dorsalgia, unspecified: Secondary | ICD-10-CM

## 2012-10-21 DIAGNOSIS — T148XXA Other injury of unspecified body region, initial encounter: Secondary | ICD-10-CM

## 2012-10-21 DIAGNOSIS — IMO0002 Reserved for concepts with insufficient information to code with codable children: Secondary | ICD-10-CM

## 2012-10-21 NOTE — Patient Instructions (Signed)
Vertebral Fracture  You have a fracture of one or more vertebra. These are the bony parts that form the spine. Minor vertebral fractures happen when people fall. Osteoporosis is associated with many of these fractures. Hospital care may not be necessary for minor compression fractures that are stable. However, multiple fractures of the spine or unstable injuries can cause severe pain and even damage the spinal cord. A spinal cord injury may cause paralysis, numbness, or loss of normal bowel and bladder control.   Normally there is pain and stiffness in the back for 3 to 6 weeks after a vertebral fracture. Bed rest for several days, pain medicine, and a slow return to activity is often the only treatment that is needed depending on the location of the fracture. Neck and back braces may be helpful in reducing pain and increasing mobility. When your pain allows, you should begin walking or swimming to help maintain your endurance. Exercises to improve motion and to strengthen the back may also be useful after the initial pain improves. Treatment for osteoporosis may be essential for full recovery. This will help reduce your risk of vertebral fractures with a future fall.  During the first few days after a spine fracture you may feel nauseated or vomit. If this is severe, hospital care with IV fluids will be needed.   Arrange for follow-up care as recommended to assure proper long-term care and prevention of further spine injury.   SEEK IMMEDIATE MEDICAL CARE IF:   You have increasing pain, vomiting, or are unable to move around at all.   You develop numbness, tingling, weakness, or paralysis of any part of your body.   You develop a loss of normal bowel or bladder control.   You have difficulty breathing, cough, fever, chest or abdominal pain.  MAKE SURE YOU:    Understand these instructions.   Will watch your condition.   Will get help right away if you are not doing well or get worse.  Document Released:  04/29/2004 Document Revised: 06/14/2011 Document Reviewed: 11/12/2008  ExitCare Patient Information 2014 [employer].

## 2012-10-21 NOTE — Progress Notes (Signed)
[facility]:  Office Visit  Chief Complaint:  Chief Complaint  Patient presents with  . Fall    11:00 today  . Back Pain    HPI: [name] [name] is a 52 y.o. male who complains of  Back pain after fell out of tree truck bucket helping a neighbor cut down tree limbs this AM.  Does not know how high he was, probably 1 story He fell and was in a rolled position and landed on limbs that were all down there. He has mostly mid back pain Did hit his head, did not have LOC, has an abrasion on top of his head He took some oxycodone that he had and feels much better.  He denies incontinence He denies weakness, numbness, tingling He denies any AMS, HA, N/v/abd pain He has pain with deep breaths He denies osteoporosis or osteopenia He is urinating fine, no blood in his urine  He has bladder cancer Dr. Valentina Gu (urologist)  Past Medical History  Diagnosis Date  . Gout     recent flair -bilateral feet--  STABLE  . Frequency of urination   . Urgency incontinence   . Nocturia   . Diabetes mellitus type 2, diet-controlled PT STOPPED TAKING METFORMIN--  HAS BEEN WATCHING DIET, EXERCISING AND LOSING WT  . Bladder cancer 2013  . Complication of anesthesia     agitation w/awakening in 9/13,OK 01/24/12   Past Surgical History  Procedure Laterality Date  . Ulner artery repair  2009    rt -trauma /thrombectomy,repair  . Transurethral resection of bladder tumor  12/15/2011    Procedure: TRANSURETHRAL RESECTION OF BLADDER TUMOR (TURBT);  Surgeon: Molli Hazard, MD;  Location: [facility];  Service: Urology;  Laterality: N/A;  2 HRS Procedure reads: cysto, TURBT , Bilateral retrograde pyelograms, poss bilateral ureteroscopy with biopsy and ureter stent placement   . Lumbar laminectomy  06-02-2005    W/ RESECTION NERVE ROOT  L4  -  L5  . Transurethral resection of bladder tumor  01/24/2012    Procedure: TRANSURETHRAL RESECTION OF BLADDER TUMOR (TURBT);   Surgeon: Molli Hazard, MD;  Location: [facility];  Service: Urology;  Laterality: N/A;  90 MIN ADD LEFT URETER BIOPSY TO PROCEDURE    . Cystoscopy with urethral dilatation  01/24/2012    Procedure: CYSTOSCOPY WITH URETHRAL DILATATION;  Surgeon: Molli Hazard, MD;  Location: [facility];  Service: Urology;  Laterality: N/A;  . Transurethral resection of bladder tumor N/A 07/10/2012    Procedure: TRANSURETHRAL RESECTION OF BLADDER TUMOR (TURBT);  Surgeon: Molli Hazard, MD;  Location: [facility];  Service: Urology;  Laterality: N/A;  . Cystoscopy w/ retrogrades Bilateral 07/10/2012    Procedure: CYSTOSCOPY WITH RETROGRADE PYELOGRAM;  Surgeon: Molli Hazard, MD;  Location: [facility];  Service: Urology;  Laterality: Bilateral;   History   Social History  . Marital Status: Married    Spouse Name: N/A    Number of Children: N/A  . Years of Education: N/A   Social History Main Topics  . Smoking status: Former Smoker -- 1.00 packs/day for 16 years    Types: Cigarettes    Quit date: 01/20/1992  . Smokeless tobacco: Former Systems developer    Types: [place] date: 01/20/1992  . Alcohol Use: No  . Drug Use: No  . Sexually Active: None   Other Topics Concern  . None   Social History Narrative  .  None   No family history on file. No Known Allergies Prior to Admission medications   Medication Sig Start Date End Date Taking? Authorizing Provider  citalopram (CELEXA) 40 MG tablet Take 40 mg by mouth daily.   Yes Historical Provider, MD  metFORMIN (GLUCOPHAGE) 500 MG tablet Take 500 mg by mouth 2 (two) times daily with a meal.   Yes Historical Provider, MD  HYDROcodone-acetaminophen (NORCO/VICODIN) 5-325 MG per tablet Take 1-2 tablets by mouth every 4 (four) hours as needed for pain. 07/10/12   Molli Hazard, MD  hyoscyamine (LEVSIN, ANASPAZ) 0.125 MG tablet Take 1 tablet (0.125 mg total) by mouth  every 4 (four) hours as needed for cramping (bladder spasms). 12/15/11 12/25/11  Molli Hazard, MD  hyoscyamine (LEVSIN, ANASPAZ) 0.125 MG tablet Take 1 tablet (0.125 mg total) by mouth every 4 (four) hours as needed for cramping (bladder spasms). 01/24/12 02/03/12  Molli Hazard, MD  lisinopril (PRINIVIL,ZESTRIL) 10 MG tablet Take 10 mg by mouth every morning.    Historical Provider, MD  oxybutynin (DITROPAN) 5 MG tablet Take 1 tablet (5 mg total) by mouth every 6 (six) hours as needed (Bladder spasms). 01/24/12 01/23/13  Molli Hazard, MD  phenazopyridine (PYRIDIUM) 100 MG tablet Take 2 tablets (200 mg total) by mouth every 8 (eight) hours as needed for pain (Burning urination.  Will turn urine and body fluids orange.). 01/24/12   Molli Hazard, MD  phenazopyridine (PYRIDIUM) 100 MG tablet Take 1 tablet (100 mg total) by mouth every 8 (eight) hours as needed for pain (Burning urination.  Will turn urine and body fluids orange.). 07/10/12   Molli Hazard, MD  senna-docusate (SENOKOT S) 8.6-50 MG per tablet Take 1 tablet by mouth 2 (two) times daily. 01/24/12 01/23/13  Molli Hazard, MD     ROS: The patient denies fevers, chills, night sweats, unintentional weight loss,  palpitations, wheezing, dyspnea on exertion, nausea, vomiting, abdominal pain, dysuria, hematuria, melena, numbness, weakness, or tingling.  All other systems have been reviewed and were otherwise negative with the exception of those mentioned in the HPI and as above.    PHYSICAL EXAM: Filed Vitals:   10/21/12 1622  BP: 130/64  Pulse: 87  Temp: 97.8 F (36.6 C)  Resp: 18  Spo2   97% Filed Vitals:   10/21/12 1622  Height: 5\' 10"  (1.778 m)  Weight: 210 lb (95.255 kg)   Body mass index is 30.13 kg/(m^2).  General: Alert, no acute distress HEENT:  Normocephalic, atraumatic, oropharynx patent. EOMI, PERRLA, fundoscopic exam nlm abrasion on top of head Cardiovascular:  Regular  rate and rhythm, no rubs murmurs or gallops.  No Carotid bruits, radial pulse intact. No pedal edema.  Respiratory: Clear to auscultation bilaterally.  No wheezes, rales, or rhonchi.  No cyanosis, no use of accessory musculature GI: No organomegaly, abdomen is soft and non-tender, positive bowel sounds.  No masses. Skin: No rashes. Neurologic: Facial musculature symmetric. CN 2-12 grossly nl Psychiatric: Patient is appropriate throughout our interaction. Lymphatic: No cervical lymphadenopathy Musculoskeletal: Gait slight limp due to pain but walking on own No saddle anesthestia 5/5  Strength UE and Zedekiah Hinderman 2/2 DTRs in UE and Delisha Peaden ROM decrease due to pain in upper, mid nad lower back He has para msk tenderness diffusely No chest wall tenderness    LABS: Results for orders placed during the hospital encounter of 07/10/12  GLUCOSE, CAPILLARY      Result Value Range   Glucose-Capillary 158 (*) 70 -  99 mg/dL  POCT I-STAT 4, (NA,K, GLUC, HGB,HCT)      Result Value Range   Sodium 137  135 - 145 mEq/L   Potassium 4.1  3.5 - 5.1 mEq/L   Glucose, Bld 173 (*) 70 - 99 mg/dL   HCT 42.0  39.0 - 52.0 %   Hemoglobin 14.3  13.0 - 17.0 g/dL     EKG/XRAY:   Primary read interpreted by Dr. Marin Comment at [facility]. ? T11 compression fracutre Slippage of L4-5 ? Pars abnormality at L4 No pneumothorax.   ASSESSMENT/PLAN: Encounter Diagnoses  Name Primary?  . Back pain Yes  . Compression fracture    Compression vertebral fx at T11 Has pain meds at home he can take, if he still has pain then need to call me I will calll neurosurgery tomorrow to see if he needs to be in  TLSO and if they will see him Currently no neuro sxs Do not work or do exertional acitvities If he does not see neuro/ortho spine then he needs to F/u in 1 week   Dwanna Goshert, [place], DO 10/22/2012 11:21 AM    10/22/12 @ 12:11 Spoke with Dr. Luiz Ochoa from Neurosurgery Advise to put patient in back brace and they will follow-up with patient in  1-2 weeks.  Sooner if have worsening pain I will write rx for pre-fab TLSO which [facility] get [facility]  Also wrote him a rx for percocet , flexeril prn  All rx left up front for him to pick up LM to call me back if have any questions

## 2012-10-22 ENCOUNTER — Telehealth: Payer: Self-pay | Admitting: Family Medicine

## 2012-10-22 MED ORDER — OXYCODONE-ACETAMINOPHEN 5-325 MG PO TABS: 1.0000 | ORAL_TABLET | Freq: Three times a day (TID) | ORAL | Status: DC | PRN

## 2012-10-22 MED ORDER — CYCLOBENZAPRINE HCL 10 MG PO TABS: 10.0000 mg | ORAL_TABLET | Freq: Two times a day (BID) | ORAL | Status: DC | PRN

## 2012-10-22 NOTE — Telephone Encounter (Signed)
LM to inquire how he is doing.

## 2012-10-27 ENCOUNTER — Telehealth: Payer: Self-pay | Admitting: Family Medicine

## 2012-10-27 ENCOUNTER — Ambulatory Visit (INDEPENDENT_AMBULATORY_CARE_PROVIDER_SITE_OTHER): Payer: BC Managed Care – PPO | Admitting: Emergency Medicine

## 2012-10-27 VITALS — BP 108/62 | HR 94 | Temp 97.9°F | Resp 18 | Ht 69.0 in | Wt 209.0 lb

## 2012-10-27 DIAGNOSIS — T148XXA Other injury of unspecified body region, initial encounter: Secondary | ICD-10-CM

## 2012-10-27 DIAGNOSIS — IMO0002 Reserved for concepts with insufficient information to code with codable children: Secondary | ICD-10-CM

## 2012-10-27 NOTE — Progress Notes (Signed)
[facility] [address] [place] [address] [phone]  Date:  10/27/2012   Name:  [name] [name]   DOB:  26-Sep-1960   MRN:  [id number]  PCP:  Florina Ou, MD    Chief Complaint: Follow-up   History of Present Illness:  [name] [name] is a 52 y.o. very pleasant male patient who presents with the following:  Follow up from Dr Gus Puma visit on the 19th for a T-11 compression fracture. Is wearing a back brace and experiencing minimal relief of pain with q8h percocet.  No neuro symptoms or radiation of pain.  No improvement with over the counter medications or other home remedies. Denies other complaint or health concern today.   There are no active problems to display for this patient.   Past Medical History  Diagnosis Date  . Gout     recent flair -bilateral feet--  STABLE  . Frequency of urination   . Urgency incontinence   . Nocturia   . Diabetes mellitus type 2, diet-controlled PT STOPPED TAKING METFORMIN--  HAS BEEN WATCHING DIET, EXERCISING AND LOSING WT  . Bladder cancer 2013  . Complication of anesthesia     agitation w/awakening in 9/13,OK 01/24/12    Past Surgical History  Procedure Laterality Date  . Ulner artery repair  2009    rt -trauma /thrombectomy,repair  . Transurethral resection of bladder tumor  12/15/2011    Procedure: TRANSURETHRAL RESECTION OF BLADDER TUMOR (TURBT);  Surgeon: Molli Hazard, MD;  Location: [facility];  Service: Urology;  Laterality: N/A;  2 HRS Procedure reads: cysto, TURBT , Bilateral retrograde pyelograms, poss bilateral ureteroscopy with biopsy and ureter stent placement   . Lumbar laminectomy  06-02-2005    W/ RESECTION NERVE ROOT  L4  -  L5  . Transurethral resection of bladder tumor  01/24/2012    Procedure: TRANSURETHRAL RESECTION OF BLADDER TUMOR (TURBT);  Surgeon: Molli Hazard, MD;  Location: [facility];  Service: Urology;  Laterality: N/A;  90  MIN ADD LEFT URETER BIOPSY TO PROCEDURE    . Cystoscopy with urethral dilatation  01/24/2012    Procedure: CYSTOSCOPY WITH URETHRAL DILATATION;  Surgeon: Molli Hazard, MD;  Location: [facility];  Service: Urology;  Laterality: N/A;  . Transurethral resection of bladder tumor N/A 07/10/2012    Procedure: TRANSURETHRAL RESECTION OF BLADDER TUMOR (TURBT);  Surgeon: Molli Hazard, MD;  Location: [facility];  Service: Urology;  Laterality: N/A;  . Cystoscopy w/ retrogrades Bilateral 07/10/2012    Procedure: CYSTOSCOPY WITH RETROGRADE PYELOGRAM;  Surgeon: Molli Hazard, MD;  Location: [facility];  Service: Urology;  Laterality: Bilateral;    History  Substance Use Topics  . Smoking status: Former Smoker -- 1.00 packs/day for 16 years    Types: Cigarettes    Quit date: 01/20/1992  . Smokeless tobacco: Former Systems developer    Types: [place] date: 01/20/1992  . Alcohol Use: No    History reviewed. No pertinent family history.  No Known Allergies  Medication list has been reviewed and updated.  Current Outpatient Prescriptions on File Prior to Visit  Medication Sig Dispense Refill  . citalopram (CELEXA) 40 MG tablet Take 40 mg by mouth daily.      . cyclobenzaprine (FLEXERIL) 10 MG tablet Take 1 tablet (10 mg total) by mouth 2 (two) times daily as needed for muscle spasms.  30 tablet  0  .  HYDROcodone-acetaminophen (NORCO/VICODIN) 5-325 MG per tablet Take 1-2 tablets by mouth every 4 (four) hours as needed for pain.  25 tablet  0  . lisinopril (PRINIVIL,ZESTRIL) 10 MG tablet Take 10 mg by mouth every morning.      . metFORMIN (GLUCOPHAGE) 500 MG tablet Take 500 mg by mouth 2 (two) times daily with a meal.      . oxyCODONE-acetaminophen (ROXICET) 5-325 MG per tablet Take 1 tablet by mouth every 8 (eight) hours as needed for pain.  30 tablet  0  . phenazopyridine (PYRIDIUM) 100 MG tablet Take 1 tablet (100 mg total) by mouth  every 8 (eight) hours as needed for pain (Burning urination.  Will turn urine and body fluids orange.).  30 tablet  1  . hyoscyamine (LEVSIN, ANASPAZ) 0.125 MG tablet Take 1 tablet (0.125 mg total) by mouth every 4 (four) hours as needed for cramping (bladder spasms).  40 tablet  4  . hyoscyamine (LEVSIN, ANASPAZ) 0.125 MG tablet Take 1 tablet (0.125 mg total) by mouth every 4 (four) hours as needed for cramping (bladder spasms).  40 tablet  4  . oxybutynin (DITROPAN) 5 MG tablet Take 1 tablet (5 mg total) by mouth every 6 (six) hours as needed (Bladder spasms).  40 tablet  4  . senna-docusate (SENOKOT S) 8.6-50 MG per tablet Take 1 tablet by mouth 2 (two) times daily.  60 tablet  0   Current Facility-Administered Medications on File Prior to Visit  Medication Dose Route Frequency Provider Last Rate Last Dose  . levofloxacin (LEVAQUIN) IVPB 500 mg  500 mg Intravenous Q24H Molli Hazard, MD        Review of Systems:  As per HPI, otherwise negative.    Physical Examination: Filed Vitals:   10/27/12 1301  BP: 108/62  Pulse: 94  Temp: 97.9 F (36.6 C)  Resp: 18   Filed Vitals:   10/27/12 1301  Height: 5\' 9"  (1.753 m)  Weight: 209 lb (94.802 kg)   Body mass index is 30.85 kg/(m^2). Ideal Body Weight: Weight in (lb) to have BMI = 25: 168.9   GEN: WDWN, NAD, Non-toxic, Alert & Oriented x 3 HEENT: Atraumatic, Normocephalic.  Ears and Nose: No external deformity. EXTR: No clubbing/cyanosis/edema NEURO: Normal gait.  PSYCH: Normally interactive. Conversant. Not depressed or anxious appearing.  Calm demeanor.  BACK:  Tender mid back.  Neuro grossly intact.  Assessment and Plan: Pending neuro appt for fracture Increase percocet to 1 q4h Increase flexeril to 1 po tid. Follow up as needed   Signed,  Ellison Carwin, MD

## 2012-10-27 NOTE — Telephone Encounter (Signed)
Worsening back pain s/p traumatic vertrebral compression fx . He is using back brace. Advise to avoid working but he is self employed. He has a pending neurosurgery appt but I have asked referrals to check when that is is and contact the patient. HE will come in today for re-eval and should be fast tracked if possible. No neuro sxs that as of today.

## 2013-04-05 HISTORY — PX: NEPHROSTOMY TUBE PLACEMENT (ARMC HX): HXRAD1726

## 2013-04-26 HISTORY — PX: BLADDER SURGERY: SHX569

## 2013-05-09 ENCOUNTER — Emergency Department (HOSPITAL_COMMUNITY): Payer: BC Managed Care – PPO

## 2013-05-09 ENCOUNTER — Other Ambulatory Visit: Payer: Self-pay

## 2013-05-09 ENCOUNTER — Inpatient Hospital Stay (HOSPITAL_COMMUNITY)
Admission: EM | Admit: 2013-05-09 | Discharge: 2013-05-13 | DRG: 872 | Disposition: A | Discharge status: Home Health | Payer: BC Managed Care – PPO | Attending: Internal Medicine | Admitting: Internal Medicine

## 2013-05-09 ENCOUNTER — Encounter (HOSPITAL_COMMUNITY): Payer: Self-pay | Admitting: Emergency Medicine

## 2013-05-09 DIAGNOSIS — I10 Essential (primary) hypertension: Secondary | ICD-10-CM | POA: Diagnosis present

## 2013-05-09 DIAGNOSIS — N12 Tubulo-interstitial nephritis, not specified as acute or chronic: Secondary | ICD-10-CM

## 2013-05-09 DIAGNOSIS — A419 Sepsis, unspecified organism: Secondary | ICD-10-CM | POA: Diagnosis present

## 2013-05-09 DIAGNOSIS — E872 Acidosis, unspecified: Secondary | ICD-10-CM | POA: Diagnosis present

## 2013-05-09 DIAGNOSIS — D649 Anemia, unspecified: Secondary | ICD-10-CM | POA: Diagnosis present

## 2013-05-09 DIAGNOSIS — Z8551 Personal history of malignant neoplasm of bladder: Secondary | ICD-10-CM

## 2013-05-09 DIAGNOSIS — R6510 Systemic inflammatory response syndrome (SIRS) of non-infectious origin without acute organ dysfunction: Secondary | ICD-10-CM

## 2013-05-09 DIAGNOSIS — F411 Generalized anxiety disorder: Secondary | ICD-10-CM | POA: Diagnosis present

## 2013-05-09 DIAGNOSIS — F329 Major depressive disorder, single episode, unspecified: Secondary | ICD-10-CM | POA: Diagnosis present

## 2013-05-09 DIAGNOSIS — K219 Gastro-esophageal reflux disease without esophagitis: Secondary | ICD-10-CM | POA: Diagnosis present

## 2013-05-09 DIAGNOSIS — Z79899 Other long term (current) drug therapy: Secondary | ICD-10-CM

## 2013-05-09 DIAGNOSIS — D72829 Elevated white blood cell count, unspecified: Secondary | ICD-10-CM

## 2013-05-09 DIAGNOSIS — Z87891 Personal history of nicotine dependence: Secondary | ICD-10-CM

## 2013-05-09 DIAGNOSIS — M109 Gout, unspecified: Secondary | ICD-10-CM | POA: Diagnosis present

## 2013-05-09 DIAGNOSIS — N179 Acute kidney failure, unspecified: Secondary | ICD-10-CM | POA: Diagnosis present

## 2013-05-09 DIAGNOSIS — K5900 Constipation, unspecified: Secondary | ICD-10-CM | POA: Diagnosis present

## 2013-05-09 DIAGNOSIS — F3289 Other specified depressive episodes: Secondary | ICD-10-CM | POA: Diagnosis present

## 2013-05-09 DIAGNOSIS — N10 Acute tubulo-interstitial nephritis: Secondary | ICD-10-CM | POA: Diagnosis present

## 2013-05-09 DIAGNOSIS — R000 Tachycardia, unspecified: Secondary | ICD-10-CM | POA: Diagnosis present

## 2013-05-09 DIAGNOSIS — C679 Malignant neoplasm of bladder, unspecified: Secondary | ICD-10-CM

## 2013-05-09 DIAGNOSIS — A4159 Other Gram-negative sepsis: Principal | ICD-10-CM | POA: Diagnosis present

## 2013-05-09 DIAGNOSIS — E119 Type 2 diabetes mellitus without complications: Secondary | ICD-10-CM | POA: Diagnosis present

## 2013-05-09 HISTORY — DX: Depression, unspecified: F32.A

## 2013-05-09 HISTORY — DX: Gastro-esophageal reflux disease without esophagitis: K21.9

## 2013-05-09 HISTORY — DX: Major depressive disorder, single episode, unspecified: F32.9

## 2013-05-09 HISTORY — DX: Essential (primary) hypertension: I10

## 2013-05-09 HISTORY — DX: Anxiety disorder, unspecified: F41.9

## 2013-05-09 LAB — BASIC METABOLIC PANEL
BUN: 36 mg/dL — AB (ref 6–23)
CALCIUM: 10 mg/dL (ref 8.4–10.5)
CO2: 19 meq/L (ref 19–32)
CREATININE: 2.16 mg/dL — AB (ref 0.50–1.35)
Chloride: 90 mEq/L — ABNORMAL LOW (ref 96–112)
GFR calc Af Amer: 38 mL/min — ABNORMAL LOW (ref >90)
GFR calc non Af Amer: 33 mL/min — ABNORMAL LOW (ref >90)
GLUCOSE: 172 mg/dL — AB (ref 70–99)
Potassium: 4.9 mEq/L (ref 3.7–5.3)
Sodium: 126 mEq/L — ABNORMAL LOW (ref 137–147)

## 2013-05-09 LAB — URINE MICROSCOPIC-ADD ON

## 2013-05-09 LAB — URINALYSIS, ROUTINE W REFLEX MICROSCOPIC
BILIRUBIN URINE: NEGATIVE
Bilirubin Urine: NEGATIVE
GLUCOSE, UA: NEGATIVE mg/dL
GLUCOSE, UA: NEGATIVE mg/dL
Ketones, ur: NEGATIVE mg/dL
Ketones, ur: NEGATIVE mg/dL
Nitrite: POSITIVE — AB
Nitrite: NEGATIVE
PH: 7 (ref 5.0–8.0)
PH: 7.5 (ref 5.0–8.0)
Protein, ur: 100 mg/dL — AB
Protein, ur: >300 mg/dL — AB
SPECIFIC GRAVITY, URINE: 1.013 (ref 1.005–1.030)
SPECIFIC GRAVITY, URINE: 1.01 (ref 1.005–1.030)
Urobilinogen, UA: 0.2 mg/dL (ref 0.0–1.0)
Urobilinogen, UA: 0.2 mg/dL (ref 0.0–1.0)

## 2013-05-09 LAB — CBC
HEMATOCRIT: 32.5 % — AB (ref 39.0–52.0)
Hemoglobin: 11.5 g/dL — ABNORMAL LOW (ref 13.0–17.0)
MCH: 29.9 pg (ref 26.0–34.0)
MCHC: 35.4 g/dL (ref 30.0–36.0)
MCV: 84.4 fL (ref 78.0–100.0)
PLATELETS: 372 10*3/uL (ref 150–400)
RBC: 3.85 MIL/uL — AB (ref 4.22–5.81)
RDW: 13 % (ref 11.5–15.5)
WBC: 29.1 10*3/uL — ABNORMAL HIGH (ref 4.0–10.5)

## 2013-05-09 LAB — LACTIC ACID, PLASMA: LACTIC ACID, VENOUS: 1.7 mmol/L (ref 0.5–2.2)

## 2013-05-09 LAB — POCT I-STAT TROPONIN I: TROPONIN I, POC: 0.05 ng/mL (ref 0.00–0.08)

## 2013-05-09 MED ORDER — CITALOPRAM HYDROBROMIDE 40 MG PO TABS
40.0000 mg | ORAL_TABLET | Freq: Every day | ORAL | Status: DC
  Administered 2013-05-10 – 2013-05-13 (×4): 40 mg via ORAL
  Filled 2013-05-09 (×4): qty 1

## 2013-05-09 MED ORDER — PIPERACILLIN-TAZOBACTAM 3.375 G IVPB 30 MIN
3.3750 g | Freq: Once | INTRAVENOUS | Status: AC
  Administered 2013-05-09: 3.375 g via INTRAVENOUS
  Filled 2013-05-09: qty 50

## 2013-05-09 MED ORDER — SODIUM CHLORIDE 0.9 % IJ SOLN
3.0000 mL | Freq: Two times a day (BID) | INTRAMUSCULAR | Status: DC
Start: 2013-05-09 — End: 2013-05-13
  Administered 2013-05-10 – 2013-05-13 (×8): 3 mL via INTRAVENOUS

## 2013-05-09 MED ORDER — INSULIN ASPART 100 UNIT/ML ~~LOC~~ SOLN
0.0000 [IU] | Freq: Three times a day (TID) | SUBCUTANEOUS | Status: DC
  Administered 2013-05-10 (×2): 3 [IU] via SUBCUTANEOUS
  Administered 2013-05-10 – 2013-05-12 (×4): 2 [IU] via SUBCUTANEOUS
  Administered 2013-05-12: 1 [IU] via SUBCUTANEOUS
  Administered 2013-05-12: 3 [IU] via SUBCUTANEOUS
  Administered 2013-05-13: 1 [IU] via SUBCUTANEOUS

## 2013-05-09 MED ORDER — ACETAMINOPHEN 325 MG PO TABS
650.0000 mg | ORAL_TABLET | Freq: Four times a day (QID) | ORAL | Status: DC | PRN
  Administered 2013-05-10: 650 mg via ORAL
  Filled 2013-05-09: qty 2

## 2013-05-09 MED ORDER — HYOSCYAMINE SULFATE 0.125 MG PO TABS: 0.1250 mg | ORAL_TABLET | ORAL | Status: DC | PRN

## 2013-05-09 MED ORDER — ONDANSETRON HCL 4 MG PO TABS: 4.0000 mg | ORAL_TABLET | Freq: Four times a day (QID) | ORAL | Status: DC | PRN

## 2013-05-09 MED ORDER — AMLODIPINE BESYLATE 10 MG PO TABS
10.0000 mg | ORAL_TABLET | Freq: Every day | ORAL | Status: DC
  Administered 2013-05-10 – 2013-05-13 (×4): 10 mg via ORAL
  Filled 2013-05-09 (×4): qty 1

## 2013-05-09 MED ORDER — SODIUM CHLORIDE 0.9 % IV BOLUS (SEPSIS)
1000.0000 mL | Freq: Once | INTRAVENOUS | Status: AC
  Administered 2013-05-09: 1000 mL via INTRAVENOUS

## 2013-05-09 MED ORDER — MORPHINE SULFATE 2 MG/ML IJ SOLN: 1.0000 mg | INTRAMUSCULAR | Status: DC | PRN

## 2013-05-09 MED ORDER — FAMOTIDINE 20 MG PO TABS
20.0000 mg | ORAL_TABLET | Freq: Every day | ORAL | Status: DC
  Administered 2013-05-10 – 2013-05-13 (×4): 20 mg via ORAL
  Filled 2013-05-09 (×4): qty 1

## 2013-05-09 MED ORDER — ACETAMINOPHEN 650 MG RE SUPP: 650.0000 mg | Freq: Four times a day (QID) | RECTAL | Status: DC | PRN

## 2013-05-09 MED ORDER — VANCOMYCIN HCL IN DEXTROSE 750-5 MG/150ML-% IV SOLN
750.0000 mg | Freq: Two times a day (BID) | INTRAVENOUS | Status: DC
  Administered 2013-05-10 – 2013-05-11 (×3): 750 mg via INTRAVENOUS
  Filled 2013-05-09 (×5): qty 150

## 2013-05-09 MED ORDER — TECHNETIUM TC 99M DIETHYLENETRIAME-PENTAACETIC ACID: 40.0000 | Freq: Once | INTRAVENOUS | Status: DC | PRN

## 2013-05-09 MED ORDER — LORAZEPAM 2 MG/ML IJ SOLN
1.0000 mg | Freq: Once | INTRAMUSCULAR | Status: AC
  Administered 2013-05-09: 1 mg via INTRAVENOUS
  Filled 2013-05-09: qty 1

## 2013-05-09 MED ORDER — ONDANSETRON HCL 4 MG/2ML IJ SOLN: 4.0000 mg | Freq: Four times a day (QID) | INTRAMUSCULAR | Status: DC | PRN

## 2013-05-09 MED ORDER — ENOXAPARIN SODIUM 40 MG/0.4ML ~~LOC~~ SOLN
40.0000 mg | SUBCUTANEOUS | Status: DC
  Administered 2013-05-10 – 2013-05-13 (×4): 40 mg via SUBCUTANEOUS
  Filled 2013-05-09 (×4): qty 0.4

## 2013-05-09 MED ORDER — TECHNETIUM TO 99M ALBUMIN AGGREGATED
6.0000 | Freq: Once | INTRAVENOUS | Status: AC | PRN
Start: 2013-05-09 — End: 2013-05-09
  Administered 2013-05-09: 6 via INTRAVENOUS

## 2013-05-09 MED ORDER — VANCOMYCIN HCL IN DEXTROSE 1-5 GM/200ML-% IV SOLN
1000.0000 mg | Freq: Once | INTRAVENOUS | Status: AC
  Administered 2013-05-09: 1000 mg via INTRAVENOUS
  Filled 2013-05-09: qty 200

## 2013-05-09 MED ORDER — OXYCODONE-ACETAMINOPHEN 5-325 MG PO TABS
1.0000 | ORAL_TABLET | Freq: Four times a day (QID) | ORAL | Status: DC | PRN
  Administered 2013-05-11: 1 via ORAL
  Filled 2013-05-09: qty 1

## 2013-05-09 MED ORDER — HYDRALAZINE HCL 10 MG PO TABS
10.0000 mg | ORAL_TABLET | Freq: Three times a day (TID) | ORAL | Status: DC
  Administered 2013-05-10 – 2013-05-13 (×11): 10 mg via ORAL
  Filled 2013-05-09 (×13): qty 1

## 2013-05-09 MED ORDER — IOHEXOL 300 MG/ML  SOLN
25.0000 mL | INTRAMUSCULAR | Status: AC
  Administered 2013-05-09 (×2): 25 mL via ORAL

## 2013-05-09 MED ORDER — SODIUM CHLORIDE 0.9 % IV SOLN
INTRAVENOUS | Status: AC
  Administered 2013-05-10 (×3): via INTRAVENOUS

## 2013-05-09 MED ORDER — SODIUM CHLORIDE 0.9 % IV SOLN
500.0000 mg | Freq: Three times a day (TID) | INTRAVENOUS | Status: DC
  Administered 2013-05-10 – 2013-05-12 (×7): 500 mg via INTRAVENOUS
  Filled 2013-05-09 (×9): qty 500

## 2013-05-09 NOTE — H&P (Signed)
[facility] History and Physical  CACHE JACOBY [id number] DOB: June 23, 1960 DOA: 05/09/2013  Referring physician: ER physician. PCP: Florina Ou, MD   Chief Complaint: Tachycardia.  HPI: ACEON SPEAR is a 53 y.o. male with history of bladder cancer who has had surgery done at [facility] [place] 2 weeks ago with neobladder placement and bilateral ureteral stent placement with suprapubic catheter was found to be tachycardic since last Tuesday almost a week now. Patient was instructed to go to the ER. Patient was found to be having mild fever and blood work shows leukocytosis with acute renal failure and metabolic acidosis. Due to his tachycardia VQ scan was done which was showing low probability for PE. CT abdomen pelvis done shows right-sided perinephric stranding concerning for mild obstructive uropathy versus pyelonephritis. There is also gas in the neobladder. Patient was started on empiric antibiotics after blood cultures and urine cultures and placed on IV fluids and has been admitted for further workup. Patient denies any chest pain shortness of breath productive cough nausea vomiting abdominal pain or diarrhea.   Review of Systems: As presented in the history of presenting illness, rest negative.  Past Medical History  Diagnosis Date  . Gout     recent flair -bilateral feet--  STABLE  . Frequency of urination   . Urgency incontinence   . Nocturia   . Diabetes mellitus type 2, diet-controlled PT STOPPED TAKING METFORMIN--  HAS BEEN WATCHING DIET, EXERCISING AND LOSING WT  . Bladder cancer 2013  . Complication of anesthesia     agitation w/awakening in 9/13,OK 01/24/12   Past Surgical History  Procedure Laterality Date  . Ulner artery repair  2009    rt -trauma /thrombectomy,repair  . Transurethral resection of bladder tumor  12/15/2011    Procedure: TRANSURETHRAL RESECTION OF BLADDER TUMOR (TURBT);  Surgeon: Molli Hazard, MD;  Location:  [facility];  Service: Urology;  Laterality: N/A;  2 HRS Procedure reads: cysto, TURBT , Bilateral retrograde pyelograms, poss bilateral ureteroscopy with biopsy and ureter stent placement   . Lumbar laminectomy  06-02-2005    W/ RESECTION NERVE ROOT  L4  -  L5  . Transurethral resection of bladder tumor  01/24/2012    Procedure: TRANSURETHRAL RESECTION OF BLADDER TUMOR (TURBT);  Surgeon: Molli Hazard, MD;  Location: [facility];  Service: Urology;  Laterality: N/A;  90 MIN ADD LEFT URETER BIOPSY TO PROCEDURE    . Cystoscopy with urethral dilatation  01/24/2012    Procedure: CYSTOSCOPY WITH URETHRAL DILATATION;  Surgeon: Molli Hazard, MD;  Location: [facility];  Service: Urology;  Laterality: N/A;  . Transurethral resection of bladder tumor N/A 07/10/2012    Procedure: TRANSURETHRAL RESECTION OF BLADDER TUMOR (TURBT);  Surgeon: Molli Hazard, MD;  Location: [facility];  Service: Urology;  Laterality: N/A;  . Cystoscopy w/ retrogrades Bilateral 07/10/2012    Procedure: CYSTOSCOPY WITH RETROGRADE PYELOGRAM;  Surgeon: Molli Hazard, MD;  Location: [facility];  Service: Urology;  Laterality: Bilateral;   Social History:  reports that he quit smoking about 21 years ago. His smoking use included Cigarettes. He has a 16 pack-year smoking history. He quit smokeless tobacco use about 21 years ago. His smokeless tobacco use included Chew. He reports that he does not drink alcohol or use illicit drugs. Where does patient live home. Can patient participate in ADLs? Yes.  No Known Allergies  Family History:  Family  History  Problem Relation Age of Onset  . Diabetes Mellitus II Other   . Hypertension Other   . Bladder Cancer Neg Hx       Prior to Admission medications   Medication Sig Start Date End Date Taking? Authorizing Provider  amLODipine (NORVASC) 10 MG tablet Take 10 mg by mouth  daily.   Yes Historical Provider, MD  citalopram (CELEXA) 40 MG tablet Take 40 mg by mouth daily.   Yes Historical Provider, MD  famotidine (PEPCID) 20 MG tablet Take 20 mg by mouth daily.   Yes Historical Provider, MD  hydrALAZINE (APRESOLINE) 10 MG tablet Take 10 mg by mouth 3 (three) times daily.   Yes Historical Provider, MD  nitrofurantoin (MACRODANTIN) 100 MG capsule Take 100 mg by mouth daily.   Yes Historical Provider, MD  oxyCODONE-acetaminophen (PERCOCET/ROXICET) 5-325 MG per tablet Take 1 tablet by mouth every 6 (six) hours as needed for moderate pain or severe pain.   Yes Historical Provider, MD  hyoscyamine (LEVSIN, ANASPAZ) 0.125 MG tablet Take 1 tablet (0.125 mg total) by mouth every 4 (four) hours as needed for cramping (bladder spasms). 12/15/11 12/25/11  Molli Hazard, MD  hyoscyamine (LEVSIN, ANASPAZ) 0.125 MG tablet Take 1 tablet (0.125 mg total) by mouth every 4 (four) hours as needed for cramping (bladder spasms). 01/24/12 02/03/12  Molli Hazard, MD    Physical Exam: Filed Vitals:   05/09/13 2015 05/09/13 2030 05/09/13 2045 05/09/13 2113  BP: 128/65 125/63 124/67 140/65  Pulse: 110 109 110 104  Temp:    101.7 F (38.7 C)  TempSrc:    Oral  Resp: 26 19 19 18   Height:    5\' 9"  (1.753 m)  Weight:    95.4 kg (210 lb 5.1 oz)  SpO2: 95% 96% 97% 95%     General:  Well-developed well-nourished.  Eyes: Anicteric no pallor.  ENT: No discharge from ears eyes nose mouth.  Neck: No mass felt.  Cardiovascular: S1-S2 heard tachycardic.  Respiratory: No rhonchi or crepitations.  Abdomen: Soft nontender bowel sounds present. 2 catheters are found on around the suprapubic area and one through the penis. Staples seen.  Skin: Staples seen in the abdomen. Mild pericatheter erythema. No active discharge seen around the staples are catheter site.  Musculoskeletal: No edema.  Psychiatric: Appears normal.  Neurologic: Alert awake oriented to time place and  person. Moves all extremities.  Labs on Admission:  Basic Metabolic Panel:  Recent Labs Lab 05/09/13 1349  NA 126*  K 4.9  CL 90*  CO2 19  GLUCOSE 172*  BUN 36*  CREATININE 2.16*  CALCIUM 10.0   Liver Function Tests: No results found for this basename: AST, ALT, ALKPHOS, BILITOT, PROT, ALBUMIN,  in the last 168 hours No results found for this basename: LIPASE, AMYLASE,  in the last 168 hours No results found for this basename: AMMONIA,  in the last 168 hours CBC:  Recent Labs Lab 05/09/13 1349  WBC 29.1*  HGB 11.5*  HCT 32.5*  MCV 84.4  PLT 372   Cardiac Enzymes: No results found for this basename: CKTOTAL, CKMB, CKMBINDEX, TROPONINI,  in the last 168 hours  BNP (last 3 results) No results found for this basename: PROBNP,  in the last 8760 hours CBG: No results found for this basename: GLUCAP,  in the last 168 hours  Radiological Exams on Admission: Ct Abdomen Pelvis Wo Contrast  05/09/2013   CLINICAL DATA:  Tachycardia.  History of bladder cancer.  EXAM:  CT ABDOMEN AND PELVIS WITHOUT CONTRAST  TECHNIQUE: Multidetector CT imaging of the abdomen and pelvis was performed following the standard protocol without intravenous contrast.  COMPARISON:  11/15/2011  FINDINGS: Atelectasis noted in the right base.  There is no focal liver abnormality. The gallbladder is normal. No biliary dilatation. Normal appearance of the pancreas.  The adrenal glands both appear normal. Bilateral ureteral stents are in place. The left renal collecting system is decompressed. There is right-sided pelvocaliectasis and perinephric fat stranding. Several small stones are identified within the right renal collecting system. The largest is in the inferior pole measuring 2-3 mm. The patient is status post cystectomy. Bladder reconstruction has been performed. There is a suprapubic catheter within the neobladder as well as a Foley catheter. The ureteral stents exit along the course of the supra pubic  catheter. Gas is identified within the neobladder. This is a nonspecific finding and may be related instrumentation. Alternatively this could reflect the presence of anaerobic infection.  The abdominal aorta has a normal caliber. There is no upper abdominal adenopathy identified. Pelvic lymph node dissection has been performed bilaterally. No pelvic or inguinal adenopathy identified.  The stomach appears normal. The small bowel loops have a normal course and caliber and there is no evidence for bowel obstruction. The appendix is visualized and appears normal. Normal appearance of the colon.  Review of the visualized bony structures is significant for multi level degenerative disc disease within the lumbar spine. Bilateral L4 pars defects are identified. There is an anterolisthesis of L4 on L5. Compression fracture involving the T11 vertebra is again noted.  IMPRESSION: 1. Status post cystectomy with neobladder formation. 2. Bilateral ureteral stents are in place. There is right-sided pelvocaliectasis and perinephric fat stranding which may reflect mild obstructive uropathy or pyelonephritis. Gas is noted within the neobladder which is nonspecific but may be be indicative of an anaerobic infection. 3. Similar appearance of T11 compression fracture.   Electronically Signed   By: Kerby Moors M.D.   On: 05/09/2013 19:08   Dg Chest 2 View  05/09/2013   CLINICAL DATA:  Tachycardia, shortness of breath, history diabetes, bladder cancer  EXAM: CHEST  2 VIEW  COMPARISON:  10/21/2012  FINDINGS: Upper normal heart size.  Mediastinal contours and pulmonary vascularity normal.  Mild right basilar atelectasis.  Lungs otherwise clear.  No pleural effusion or pneumothorax.  Chronic compression deformity of a vertebra at the thoracolumbar junction, approximately T11, question minimally increased.  Bones otherwise unremarkable  IMPRESSION: Mild right basilar atelectasis.  Slightly increased height loss of a vertebra at the  thoracolumbar junction, question T11.   Electronically Signed   By: Lavonia Dana M.D.   On: 05/09/2013 14:54   Nm Pulmonary Perf And Vent  05/09/2013   CLINICAL DATA:  Shortness of breath and tachycardia  EXAM: NUCLEAR MEDICINE VENTILATION - PERFUSION LUNG SCAN  Views: Anterior, posterior, left lateral, right lateral, RPO, LPO, RAO, LAO -ventilation and perfusion  Radionuclide: Technetium 7m DTPA -ventilation; Technetium 89m macroaggregated albumin-perfusion  Dose:  40.0 mCi-ventilation; 6.0 mCi- perfusion  Route of administration: Inhalation-ventilation; intravenous- perfusion  COMPARISON:  Chest radiograph May 09, 2013  FINDINGS: Radiotracer uptake on the ventilation study is homogeneous and symmetric bilaterally.  Radiotracer uptake on the perfusion study is homogeneous and symmetric bilaterally.  There is no appreciable ventilation/perfusion mismatch.  IMPRESSION: No ventilation or perfusion abnormalities are appreciated. Very low probability of pulmonary embolus.   Electronically Signed   By: Lowella Grip M.D.   On:  05/09/2013 16:30     Assessment/Plan Principal Problem:   SIRS (systemic inflammatory response syndrome) Active Problems:   Pyelonephritis   HTN (hypertension)   Bladder cancer   Leucocytosis   Tachycardia   1. SIRS/impending sepsis from possible pyelonephritis - patient has been started on empiric antibiotics and I have continued patient on vancomycin and Primaxin. I will be discussing with on-call urologist for further recommendations. I have continued patient on IV fluids. Continue to closely monitor in telemetry. Follow blood cultures and urine cultures. 2. Acute renal failure - patient's creatinine has increased from baseline. I have placed patient on fluids. Closely follow intake output and metabolic panel. 3. Metabolic acidosis - probably from renal failure and #1. Continue to hydrate aggressively and closely follow metabolic panel. 4. Leukocytosis - follow blood  cultures and urine cultures. Probably from #1. Closely follow CBC with differentials. 5. History of bladder cancer - as per urologist. 6. History of hypertension - for now we will continue blood pressure medications and closely follow blood pressure trends. 7. Hyperglycemia with history of diabetes mellitus 2 - patient's wife states that he was recently taken off his diabetic medications 6 months ago. At this time I have placed patient on sliding-scale coverage with insulin. Check hemoglobin A1c. 8. Anemia - closely follow CBC. 9. Recent bladder surgery as described in history of presenting illness for bladder cancer.  I have reviewed patient's old charts and labs. I will be discussing with on-call urologist.  Code Status: Full code.  Family Communication: Patient's wife at the bedside.  Disposition Plan: Admit to inpatient.    Jorden Minchey N. [facility] Pager [phone].  If 7PM-7AM, please contact night-coverage [email or web address] Password [facility] 05/09/2013, 9:48 PM

## 2013-05-09 NOTE — ED Notes (Signed)
Pt states his HR has been in the 120's since yesterday.  Pt states he saw his family health nurse yesterday and had labs drawn.  Pt states he hasn't heard anything from them and they won't return his calls.  Pt with hx of bladder cancer.  Pt seen by [facility] for his bladder cancer.  Pt had bladder removed 2 weeks ago.

## 2013-05-09 NOTE — Progress Notes (Signed)
I have discussed with on-call urologist Dr. Diona Fanti. Dr. Diona Fanti after the patient's CAT scan abdomen and pelvis fails patient's symptoms are secondary to pyelonephritis and his ureteral stents are patent. Dr. Diona Fanti has advised to continue with broad-spectrum antibiotics and fluids and they will be following patient in consult.  Rise Patience

## 2013-05-09 NOTE — ED Provider Notes (Signed)
CSN: [id number]     Arrival date & time 05/09/13  1341 History   First MD Initiated Contact with Patient 05/09/13 1458     Chief Complaint  Patient presents with  . Tachycardia  . Shortness of Breath   (Consider location/radiation/quality/duration/timing/severity/associated sxs/prior Treatment) Patient is a 53 y.o. male presenting with shortness of breath. The history is provided by the patient.  Shortness of Breath Associated symptoms: abdominal pain   Associated symptoms: no chest pain, no headaches, no rash and no vomiting    patient was sent in by home health for her tachycardia. 2 weeks ago he had a bladder removal due to cancer at cancer treatment centers of [place]. He has a neobladder and has a Foley catheter and a suprapubic catheter with bilateral renal stents. He denies fever but has had some chills. He has had no changes abdominal pain and states he has been improving. No real chest pain. He denies she shortness of breath. He's had a mild cough. No change in his urine. No diarrhea or constipation. No rash.  Past Medical History  Diagnosis Date  . Gout     recent flair -bilateral feet--  STABLE  . Frequency of urination   . Urgency incontinence   . Nocturia   . Diabetes mellitus type 2, diet-controlled PT STOPPED TAKING METFORMIN--  HAS BEEN WATCHING DIET, EXERCISING AND LOSING WT  . Bladder cancer 2013  . Complication of anesthesia     agitation w/awakening in 9/13,OK 01/24/12   Past Surgical History  Procedure Laterality Date  . Ulner artery repair  2009    rt -trauma /thrombectomy,repair  . Transurethral resection of bladder tumor  12/15/2011    Procedure: TRANSURETHRAL RESECTION OF BLADDER TUMOR (TURBT);  Surgeon: Molli Hazard, MD;  Location: [facility];  Service: Urology;  Laterality: N/A;  2 HRS Procedure reads: cysto, TURBT , Bilateral retrograde pyelograms, poss bilateral ureteroscopy with biopsy and ureter stent placement   . Lumbar  laminectomy  06-02-2005    W/ RESECTION NERVE ROOT  L4  -  L5  . Transurethral resection of bladder tumor  01/24/2012    Procedure: TRANSURETHRAL RESECTION OF BLADDER TUMOR (TURBT);  Surgeon: Molli Hazard, MD;  Location: [facility];  Service: Urology;  Laterality: N/A;  90 MIN ADD LEFT URETER BIOPSY TO PROCEDURE    . Cystoscopy with urethral dilatation  01/24/2012    Procedure: CYSTOSCOPY WITH URETHRAL DILATATION;  Surgeon: Molli Hazard, MD;  Location: [facility];  Service: Urology;  Laterality: N/A;  . Transurethral resection of bladder tumor N/A 07/10/2012    Procedure: TRANSURETHRAL RESECTION OF BLADDER TUMOR (TURBT);  Surgeon: Molli Hazard, MD;  Location: [facility];  Service: Urology;  Laterality: N/A;  . Cystoscopy w/ retrogrades Bilateral 07/10/2012    Procedure: CYSTOSCOPY WITH RETROGRADE PYELOGRAM;  Surgeon: Molli Hazard, MD;  Location: [facility];  Service: Urology;  Laterality: Bilateral;   Family History  Problem Relation Age of Onset  . Diabetes Mellitus II Other   . Hypertension Other   . Bladder Cancer Neg Hx    History  Substance Use Topics  . Smoking status: Former Smoker -- 1.00 packs/day for 16 years    Types: Cigarettes    Quit date: 01/20/1992  . Smokeless tobacco: Former Systems developer    Types: [place] date: 01/20/1992  . Alcohol Use: No    Review of Systems  Constitutional: Positive for chills  and appetite change. Negative for activity change.  Eyes: Negative for pain.  Respiratory: Positive for shortness of breath. Negative for chest tightness.   Cardiovascular: Negative for chest pain and leg swelling.  Gastrointestinal: Positive for abdominal pain. Negative for nausea, vomiting and diarrhea.  Genitourinary: Negative for flank pain.  Musculoskeletal: Negative for back pain and neck stiffness.  Skin: Negative for rash.  Neurological: Negative for weakness,  numbness and headaches.  Psychiatric/Behavioral: Negative for behavioral problems.    Allergies  Review of patient's allergies indicates no known allergies.  Home Medications   Current Outpatient Rx  Name  Route  Sig  Dispense  Refill  . amLODipine (NORVASC) 10 MG tablet   Oral   Take 10 mg by mouth daily.         . citalopram (CELEXA) 40 MG tablet   Oral   Take 40 mg by mouth daily.         . famotidine (PEPCID) 20 MG tablet   Oral   Take 20 mg by mouth daily.         . hydrALAZINE (APRESOLINE) 10 MG tablet   Oral   Take 10 mg by mouth 3 (three) times daily.         . nitrofurantoin (MACRODANTIN) 100 MG capsule   Oral   Take 100 mg by mouth daily.         Marland Kitchen oxyCODONE-acetaminophen (PERCOCET/ROXICET) 5-325 MG per tablet   Oral   Take 1 tablet by mouth every 6 (six) hours as needed for moderate pain or severe pain.         Marland Kitchen EXPIRED: hyoscyamine (LEVSIN, ANASPAZ) 0.125 MG tablet   Oral   Take 1 tablet (0.125 mg total) by mouth every 4 (four) hours as needed for cramping (bladder spasms).   40 tablet   4   . EXPIRED: hyoscyamine (LEVSIN, ANASPAZ) 0.125 MG tablet   Oral   Take 1 tablet (0.125 mg total) by mouth every 4 (four) hours as needed for cramping (bladder spasms).   40 tablet   4    BP 127/66  Pulse 110  Temp(Src) 99.5 F (37.5 C) (Oral)  Resp 20  Ht 5\' 10"  (1.778 m)  Wt 200 lb (90.719 kg)  BMI 28.70 kg/m2  SpO2 93% Physical Exam  Constitutional: He is oriented to person, place, and time. He appears well-developed and well-nourished.  HENT:  Head: Normocephalic.  Neck: Neck supple.  Cardiovascular:  Tachycardia  Pulmonary/Chest: Effort normal and breath sounds normal.  Abdominal: Soft.  Midline infraumbilical surgical site well healing. There is right lower quadrant catheter with no purulent drainage. There is also penile Foley catheter.  Musculoskeletal: Normal range of motion. He exhibits no edema and no tenderness.  Neurological:  He is alert and oriented to person, place, and time.  Skin: Skin is warm. No rash noted.    ED Course  Procedures (including critical care time) Labs Review Labs Reviewed  CBC - Abnormal; Notable for the following:    WBC 29.1 (*)    RBC 3.85 (*)    Hemoglobin 11.5 (*)    HCT 32.5 (*)    All other components within normal limits  BASIC METABOLIC PANEL - Abnormal; Notable for the following:    Sodium 126 (*)    Chloride 90 (*)    Glucose, Bld 172 (*)    BUN 36 (*)    Creatinine, Ser 2.16 (*)    GFR calc non Af Amer 33 (*)  GFR calc Af Amer 38 (*)    All other components within normal limits  URINALYSIS, ROUTINE W REFLEX MICROSCOPIC - Abnormal; Notable for the following:    Color, Urine AMBER (*)    APPearance TURBID (*)    Hgb urine dipstick LARGE (*)    Protein, ur >300 (*)    Leukocytes, UA LARGE (*)    All other components within normal limits  URINALYSIS, ROUTINE W REFLEX MICROSCOPIC - Abnormal; Notable for the following:    APPearance TURBID (*)    Hgb urine dipstick LARGE (*)    Protein, ur 100 (*)    Nitrite POSITIVE (*)    Leukocytes, UA LARGE (*)    All other components within normal limits  URINE MICROSCOPIC-ADD ON - Abnormal; Notable for the following:    Squamous Epithelial / LPF FEW (*)    Bacteria, UA MANY (*)    Casts GRANULAR CAST (*)    All other components within normal limits  URINE MICROSCOPIC-ADD ON - Abnormal; Notable for the following:    Squamous Epithelial / LPF FEW (*)    Bacteria, UA MANY (*)    All other components within normal limits  CULTURE, BLOOD (ROUTINE X 2)  CULTURE, BLOOD (ROUTINE X 2)  URINE CULTURE  URINE CULTURE  LACTIC ACID, PLASMA  POCT I-STAT TROPONIN I   Imaging Review Ct Abdomen Pelvis Wo Contrast  05/09/2013   CLINICAL DATA:  Tachycardia.  History of bladder cancer.  EXAM: CT ABDOMEN AND PELVIS WITHOUT CONTRAST  TECHNIQUE: Multidetector CT imaging of the abdomen and pelvis was performed following the standard  protocol without intravenous contrast.  COMPARISON:  11/15/2011  FINDINGS: Atelectasis noted in the right base.  There is no focal liver abnormality. The gallbladder is normal. No biliary dilatation. Normal appearance of the pancreas.  The adrenal glands both appear normal. Bilateral ureteral stents are in place. The left renal collecting system is decompressed. There is right-sided pelvocaliectasis and perinephric fat stranding. Several small stones are identified within the right renal collecting system. The largest is in the inferior pole measuring 2-3 mm. The patient is status post cystectomy. Bladder reconstruction has been performed. There is a suprapubic catheter within the neobladder as well as a Foley catheter. The ureteral stents exit along the course of the supra pubic catheter. Gas is identified within the neobladder. This is a nonspecific finding and may be related instrumentation. Alternatively this could reflect the presence of anaerobic infection.  The abdominal aorta has a normal caliber. There is no upper abdominal adenopathy identified. Pelvic lymph node dissection has been performed bilaterally. No pelvic or inguinal adenopathy identified.  The stomach appears normal. The small bowel loops have a normal course and caliber and there is no evidence for bowel obstruction. The appendix is visualized and appears normal. Normal appearance of the colon.  Review of the visualized bony structures is significant for multi level degenerative disc disease within the lumbar spine. Bilateral L4 pars defects are identified. There is an anterolisthesis of L4 on L5. Compression fracture involving the T11 vertebra is again noted.  IMPRESSION: 1. Status post cystectomy with neobladder formation. 2. Bilateral ureteral stents are in place. There is right-sided pelvocaliectasis and perinephric fat stranding which may reflect mild obstructive uropathy or pyelonephritis. Gas is noted within the neobladder which is  nonspecific but may be be indicative of an anaerobic infection. 3. Similar appearance of T11 compression fracture.   Electronically Signed   By: Kerby Moors M.D.   On:  05/09/2013 19:08   Dg Chest 2 View  05/09/2013   CLINICAL DATA:  Tachycardia, shortness of breath, history diabetes, bladder cancer  EXAM: CHEST  2 VIEW  COMPARISON:  10/21/2012  FINDINGS: Upper normal heart size.  Mediastinal contours and pulmonary vascularity normal.  Mild right basilar atelectasis.  Lungs otherwise clear.  No pleural effusion or pneumothorax.  Chronic compression deformity of a vertebra at the thoracolumbar junction, approximately T11, question minimally increased.  Bones otherwise unremarkable  IMPRESSION: Mild right basilar atelectasis.  Slightly increased height loss of a vertebra at the thoracolumbar junction, question T11.   Electronically Signed   By: Lavonia Dana M.D.   On: 05/09/2013 14:54   Nm Pulmonary Perf And Vent  05/09/2013   CLINICAL DATA:  Shortness of breath and tachycardia  EXAM: NUCLEAR MEDICINE VENTILATION - PERFUSION LUNG SCAN  Views: Anterior, posterior, left lateral, right lateral, RPO, LPO, RAO, LAO -ventilation and perfusion  Radionuclide: Technetium 44m DTPA -ventilation; Technetium 61m macroaggregated albumin-perfusion  Dose:  40.0 mCi-ventilation; 6.0 mCi- perfusion  Route of administration: Inhalation-ventilation; intravenous- perfusion  COMPARISON:  Chest radiograph May 09, 2013  FINDINGS: Radiotracer uptake on the ventilation study is homogeneous and symmetric bilaterally.  Radiotracer uptake on the perfusion study is homogeneous and symmetric bilaterally.  There is no appreciable ventilation/perfusion mismatch.  IMPRESSION: No ventilation or perfusion abnormalities are appreciated. Very low probability of pulmonary embolus.   Electronically Signed   By: Lowella Grip M.D.   On: 05/09/2013 16:30    EKG Interpretation    Date/Time:  Wednesday May 09 2013 13:48:26  EST Ventricular Rate:  117 PR Interval:  142 QRS Duration: 92 QT Interval:  342 QTC Calculation: 477 R Axis:   52 Text Interpretation:  Sinus tachycardia Otherwise normal ECG Confirmed by Litisha Guagliardo  MD, Grady Lucci ([id number]) on 05/09/2013 3:34:16 PM            MDM   1. Acute pyelonephritis    Patient presents with tachycardia and some fatigue. Found to have white count of 29. Has had recent bladder removal with stents and catheters. Mild renal insufficiency. CT angiography cannot be done, however VQ scan does not show pulmonary embolism. Urinalysis was done and shows a UTI. CT scan of abdomen and pelvis was done to look for abscess and showed pyelonephritis. Will admit to internal medicine. Has had fluid boluses and heart rate has improved somewhat.    Jasper Riling. Alvino Chapel, MD 05/09/13 2047

## 2013-05-09 NOTE — Progress Notes (Signed)
ANTIBIOTIC CONSULT NOTE - INITIAL  Pharmacy Consult for Vancomycin and Primaxin Indication: pyelonephritis  No Known Allergies  Patient Measurements: Height: 5\' 9"  (175.3 cm) Weight: 210 lb 5.1 oz (95.4 kg) IBW/kg (Calculated) : 70.7 Adjusted Body Weight:   Vital Signs: Temp: 101.7 F (38.7 C) (02/04 2113) Temp src: Oral (02/04 2113) BP: 140/65 mmHg (02/04 2113) Pulse Rate: 104 (02/04 2113) Intake/Output from previous day:   Intake/Output from this shift: Total I/O In: -  Out: 375 [Urine:375]  Labs:  Recent Labs  05/09/13 1349  WBC 29.1*  HGB 11.5*  PLT 372  CREATININE 2.16*   Estimated Creatinine Clearance: 44.6 ml/min (by C-G formula based on Cr of 2.16). No results found for this basename: VANCOTROUGH, VANCOPEAK, VANCORANDOM, GENTTROUGH, GENTPEAK, GENTRANDOM, TOBRATROUGH, TOBRAPEAK, TOBRARND, AMIKACINPEAK, AMIKACINTROU, AMIKACIN,  in the last 72 hours   Microbiology: No results found for this or any previous visit (from the past 720 hour(s)).  Medical History: Past Medical History  Diagnosis Date  . Gout     recent flair -bilateral feet--  STABLE  . Frequency of urination   . Urgency incontinence   . Nocturia   . Diabetes mellitus type 2, diet-controlled PT STOPPED TAKING METFORMIN--  HAS BEEN WATCHING DIET, EXERCISING AND LOSING WT  . Bladder cancer 2013  . Complication of anesthesia     agitation w/awakening in 9/13,OK 01/24/12  . Hypertension   . Anxiety   . Depression   . GERD (gastroesophageal reflux disease)     Medications:  Scheduled:  . [START ON 05/10/2013] amLODipine  10 mg Oral Daily  . [START ON 05/10/2013] citalopram  40 mg Oral Daily  . [START ON 05/10/2013] enoxaparin (LOVENOX) injection  40 mg Subcutaneous Q24H  . [START ON 05/10/2013] famotidine  20 mg Oral Daily  . hydrALAZINE  10 mg Oral TID  . [START ON 05/10/2013] imipenem-cilastatin  500 mg Intravenous Q8H  . [START ON 05/10/2013] insulin aspart  0-9 Units Subcutaneous TID WC  .  sodium chloride  3 mL Intravenous Q12H  . [START ON 05/10/2013] vancomycin  750 mg Intravenous Q12H   Assessment: 53 yr old male sent to ED by home health for tachycardia. He had his bladder removed 2 weeks ago and has stents and catheters. VQ scan does not show PE. Urinalysis shows a UTI. CT scan showed pyelonephritis. Pt received a dose of Zosyn and Vancomyin in the ED.  Goal of Therapy:  Vancomycin trough level 15-20 mcg/ml  Plan:  1) Primaxin 500 mg IV q8h 2) Vancomycin 750 mg IV q12h. Vanc levels when appropriate.  Minta Balsam 05/09/2013,10:57 PM

## 2013-05-09 NOTE — Progress Notes (Signed)
Called ED for report. Room ready for admission.  

## 2013-05-09 NOTE — ED Notes (Signed)
NM called to confirm pt is able to lay flat for scan. Pt reports he will be able to. NM will be sending for pt shortly.

## 2013-05-10 DIAGNOSIS — N179 Acute kidney failure, unspecified: Secondary | ICD-10-CM

## 2013-05-10 DIAGNOSIS — C679 Malignant neoplasm of bladder, unspecified: Secondary | ICD-10-CM

## 2013-05-10 LAB — LACTIC ACID, PLASMA: LACTIC ACID, VENOUS: 2.1 mmol/L (ref 0.5–2.2)

## 2013-05-10 LAB — GLUCOSE, CAPILLARY
GLUCOSE-CAPILLARY: 163 mg/dL — AB (ref 70–99)
GLUCOSE-CAPILLARY: 222 mg/dL — AB (ref 70–99)
Glucose-Capillary: 173 mg/dL — ABNORMAL HIGH (ref 70–99)
Glucose-Capillary: 213 mg/dL — ABNORMAL HIGH (ref 70–99)

## 2013-05-10 LAB — CBC WITH DIFFERENTIAL/PLATELET
BASOS ABS: 0 10*3/uL (ref 0.0–0.1)
BASOS PCT: 0 % (ref 0–1)
Eosinophils Absolute: 0 10*3/uL (ref 0.0–0.7)
Eosinophils Relative: 0 % (ref 0–5)
HCT: 26.2 % — ABNORMAL LOW (ref 39.0–52.0)
Hemoglobin: 9.2 g/dL — ABNORMAL LOW (ref 13.0–17.0)
LYMPHS PCT: 3 % — AB (ref 12–46)
Lymphs Abs: 0.5 10*3/uL — ABNORMAL LOW (ref 0.7–4.0)
MCH: 29.4 pg (ref 26.0–34.0)
MCHC: 35.1 g/dL (ref 30.0–36.0)
MCV: 83.7 fL (ref 78.0–100.0)
Monocytes Absolute: 0.8 10*3/uL (ref 0.1–1.0)
Monocytes Relative: 5 % (ref 3–12)
NEUTROS ABS: 14.1 10*3/uL — AB (ref 1.7–7.7)
Neutrophils Relative %: 91 % — ABNORMAL HIGH (ref 43–77)
PLATELETS: 290 10*3/uL (ref 150–400)
RBC: 3.13 MIL/uL — ABNORMAL LOW (ref 4.22–5.81)
RDW: 13.1 % (ref 11.5–15.5)
WBC: 15.4 10*3/uL — AB (ref 4.0–10.5)

## 2013-05-10 LAB — COMPREHENSIVE METABOLIC PANEL
ALK PHOS: 86 U/L (ref 39–117)
ALT: 25 U/L (ref 0–53)
AST: 24 U/L (ref 0–37)
Albumin: 2.2 g/dL — ABNORMAL LOW (ref 3.5–5.2)
BILIRUBIN TOTAL: 0.3 mg/dL (ref 0.3–1.2)
BUN: 38 mg/dL — AB (ref 6–23)
CHLORIDE: 97 meq/L (ref 96–112)
CO2: 15 meq/L — AB (ref 19–32)
Calcium: 9 mg/dL (ref 8.4–10.5)
Creatinine, Ser: 1.96 mg/dL — ABNORMAL HIGH (ref 0.50–1.35)
GFR, EST AFRICAN AMERICAN: 43 mL/min — AB (ref >90)
GFR, EST NON AFRICAN AMERICAN: 37 mL/min — AB (ref >90)
Glucose, Bld: 182 mg/dL — ABNORMAL HIGH (ref 70–99)
Potassium: 5.2 mEq/L (ref 3.7–5.3)
Sodium: 127 mEq/L — ABNORMAL LOW (ref 137–147)
Total Protein: 6.8 g/dL (ref 6.0–8.3)

## 2013-05-10 LAB — TSH: TSH: 1.176 u[IU]/mL (ref 0.350–4.500)

## 2013-05-10 MED ORDER — POLYETHYLENE GLYCOL 3350 17 G PO PACK
17.0000 g | PACK | Freq: Every day | ORAL | Status: DC
  Administered 2013-05-10 – 2013-05-13 (×3): 17 g via ORAL
  Filled 2013-05-10 (×4): qty 1

## 2013-05-10 MED ORDER — SENNOSIDES-DOCUSATE SODIUM 8.6-50 MG PO TABS: 2.0000 | ORAL_TABLET | Freq: Two times a day (BID) | ORAL | Status: DC | PRN

## 2013-05-10 MED ORDER — LORAZEPAM 1 MG PO TABS
1.0000 mg | ORAL_TABLET | Freq: Three times a day (TID) | ORAL | Status: DC | PRN
Start: 2013-05-10 — End: 2013-05-13

## 2013-05-10 NOTE — Consult Note (Signed)
Urology Consult  CC: Bladder cancer, fever  HPI: 53 year old male was admitted to the hospital last night, with a history of fever, shakes, shortness of breath and tachycardia. 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 He had been followed by Dr. Jasmine December of our practice for some time, and was eventually referred for possible cystectomy, after failing BCG. He was first seen at [facility] Department of urology, and eventually is referred himself to cancer treatment centers [facility] [place] in [place], [place]. He underwent radical cystoprostatectomy, bilateral pelvic lymphadenectomy and creation of an ileal neobladder approximately 2 weeks ago. According the patient, he had an uncomplicated postoperative course. He was discharged on postoperative day #8. He does not know the exact stage of his disease, but was told by his surgical urologist that there was no evidence of cancer in his lymph nodes.  The patient had an uneventful weekend following discharge, but early in the week, developed shortness of breath and was just not feeling well, with shakes and chills. He denies any nausea, vomiting, abdominal or flank pain. He went to his primary care physician 2 days ago, had an EKG and blood work, but before hearing back from her, went to the emergency room yesterday because of feeling worse. Evaluation in the emergency room included a CBC revealing hematocrit of 32.5%, white blood cell count of 29,000. He underwent abdominal CT which revealed no evidence of intra-abdominal fluid collection/abscess, minimal right pyelocaliectasis, adequate stent position, and perhaps some perinephric stranding on the right, indicative of pyelonephritis. He was admitted for further management, and urology consult is obtained.  PMH: Past Medical History  Diagnosis Date  . Gout     recent flair -bilateral feet--  STABLE  . Frequency of urination   . Urgency incontinence   . Nocturia   .  Diabetes mellitus type 2, diet-controlled PT STOPPED TAKING METFORMIN--  HAS BEEN WATCHING DIET, EXERCISING AND LOSING WT  . Bladder cancer 2013  . Complication of anesthesia     agitation w/awakening in 9/13,OK 01/24/12  . Hypertension   . Anxiety   . Depression   . GERD (gastroesophageal reflux disease)     PSH: Past Surgical History  Procedure Laterality Date  . Ulner artery repair  2009    rt -trauma /thrombectomy,repair  . Transurethral resection of bladder tumor  12/15/2011    Procedure: TRANSURETHRAL RESECTION OF BLADDER TUMOR (TURBT);  Surgeon: Molli Hazard, MD;  Location[facility];  Service: Urology;  Laterality: N/A;  2 HRS Procedure reads: cysto, TURBT , Bilateral retrograde pyelograms, poss bilateral ureteroscopy with biopsy and ureter stent placement   . Lumbar laminectomy  06-02-2005    W/ RESECTION NERVE ROOT  L4  -  L5  . Transurethral resection of bladder tumor  01/24/2012    Procedure: TRANSURETHRAL RESECTION OF BLADDER TUMOR (TURBT);  Surgeon: Molli Hazard, MD;  Location: [facility];  Service: Urology;  Laterality: N/A;  90 MIN ADD LEFT URETER BIOPSY TO PROCEDURE    . Cystoscopy with urethral dilatation  01/24/2012    Procedure: CYSTOSCOPY WITH URETHRAL DILATATION;  Surgeon: Molli Hazard, MD;  Location: [facility];  Service: Urology;  Laterality: N/A;  . Transurethral resection of bladder tumor N/A 07/10/2012    Procedure: TRANSURETHRAL RESECTION OF BLADDER TUMOR (TURBT);  Surgeon: Molli Hazard, MD;  Location: [facility];  Service: Urology;  Laterality: N/A;  . Cystoscopy w/ retrogrades Bilateral  07/10/2012    Procedure: CYSTOSCOPY WITH RETROGRADE PYELOGRAM;  Surgeon: Molli Hazard, MD;  Location: [facility];  Service: Urology;  Laterality: Bilateral;    Allergies: No Known Allergies  Medications: Prescriptions prior to admission   Medication Sig Dispense Refill  . amLODipine (NORVASC) 10 MG tablet Take 10 mg by mouth daily.      . citalopram (CELEXA) 40 MG tablet Take 40 mg by mouth daily.      . famotidine (PEPCID) 20 MG tablet Take 20 mg by mouth daily.      . hydrALAZINE (APRESOLINE) 10 MG tablet Take 10 mg by mouth 3 (three) times daily.      . nitrofurantoin (MACRODANTIN) 100 MG capsule Take 100 mg by mouth daily.      Marland Kitchen oxyCODONE-acetaminophen (PERCOCET/ROXICET) 5-325 MG per tablet Take 1 tablet by mouth every 6 (six) hours as needed for moderate pain or severe pain.      . hyoscyamine (LEVSIN, ANASPAZ) 0.125 MG tablet Take 1 tablet (0.125 mg total) by mouth every 4 (four) hours as needed for cramping (bladder spasms).  40 tablet  4  . hyoscyamine (LEVSIN, ANASPAZ) 0.125 MG tablet Take 1 tablet (0.125 mg total) by mouth every 4 (four) hours as needed for cramping (bladder spasms).  40 tablet  4     Social History: History   Social History  . Marital Status: Married    Spouse Name: N/A    Number of Children: N/A  . Years of Education: N/A   Occupational History  . Not on file.   Social History Main Topics  . Smoking status: Former Smoker -- 1.00 packs/day for 16 years    Types: Cigarettes    Quit date: 01/20/1992  . Smokeless tobacco: Former Systems developer    Types: [place] date: 01/20/1992  . Alcohol Use: No  . Drug Use: No  . Sexual Activity: Not on file   Other Topics Concern  . Not on file   Social History Narrative  . No narrative on file    Family History: Family History  Problem Relation Age of Onset  . Diabetes Mellitus II Other   . Hypertension Other   . Bladder Cancer Neg Hx     Review of Systems: Positive: Fever, chills, shakes, decreased appetite  Negative:  A further 10 point review of systems was negative except what is listed in the HPI.  Physical Exam: _0 @ General: No acute distress.  Awake. Head:  Normocephalic.  Atraumatic. ENT:  EOMI.  Mucous membranes  moist Neck:  Supple.  No lymphadenopathy. CV:  S1 present. S2 present. Increased right Pulmonary: Equal effort bilaterally.  No rales, rhonchi or wheezes Abdomen: Soft.  Non- tender to palpation. Lower abdominal incision healing well, without evidence of erythema. No rebound or guarding. Suprapubic tube adequately positioned, without significant leakage around it. Skin:  Normal turgor.  No visible rash. Extremity: No gross deformity of bilateral upper extremities.  No gross deformity of bilateral lower extremities. Neurologic: Alert. Appropriate mood.  Penis:    No lesions. Urethra: Foley catheter in place.  Orthotopic meatus. Scrotum: No lesions.  No ecchymosis.  No erythema. Testicles: Descended bilaterally.  No masses bilaterally. Epididymis: Palpable bilaterally.  Non Tender to palpation.  Studies:  Recent Labs     05/09/13  1349  HGB  11.5*  WBC  29.1*  PLT  372    Recent Labs     05/09/13  1349  NA  126*  K  4.9  CL  90*  CO2  19  BUN  36*  CREATININE  2.16*  CALCIUM  10.0  GFRNONAA  33*  GFRAA  38*     No results found for this basename: PT, INR, APTT,  in the last 72 hours   No components found with this basename: ABG,   I reviewed the patient's CT images. Stents appear adequately positioned and functioning. There is no evidence of bowel obstruction. I do not see any evidence of abdominal/pelvic fluid collection. Air in the neobladder is a normal finding.  Assessment:  1. Recurrent carcinoma in situ of bladder, status post cystoprostatectomy, bilateral pelvic lymphadenectomy, creation of ileal neobladder in [place], [place] 2 weeks ago.   2. Probable pyelonephritis, not terribly unexpected and postoperative neobladder/cystectomy patient's.  3. Mild right pyelocaliectasis. I think, more than likely, the patient stent is draining adequately. He has minimal, probable physiologic dilation of this right system.  4. Renal insufficiency, creatinine 2.16. More  likely, this is secondary to the patient's pyelonephritis/medical condition/dehydration    Plan: 1. Continue IV hydration, broad-spectrum antibiotic management  2. I do not think the patient needs any urologic intervention at this time i.e. other drainage processes of the kidneys  3. The patient does need to irrigate his neobladder a couple of times a day-I've ordered an irrigation kit to his bedside  4. I will notify Dr. Jasmine December of the patient's admission for further followup.    Pager:[phone]

## 2013-05-10 NOTE — Progress Notes (Signed)
[facility] PROGRESS NOTE  [name] [name] [id number] DOB: 04/12/1960 DOA: 05/09/2013 PCP: Florina Ou, MD Brief HPI: [name] [name] is a 53 y.o. male with history of bladder cancer who has had surgery done at [facility] at [place] 2 weeks ago with neobladder placement and bilateral ureteral stent placement with suprapubic catheter was found to be tachycardic since last Tuesday almost a week now. Patient was instructed to go to the ER. Patient was found to be having mild fever and blood work shows leukocytosis with acute renal failure and metabolic acidosis. Due to his tachycardia VQ scan was done which was showing low probability for PE. CT abdomen pelvis done shows right-sided perinephric stranding concerning for mild obstructive uropathy versus pyelonephritis. There is also gas in the neobladder. Patient was started on empiric antibiotics after blood cultures and urine cultures and placed on IV fluids and has been admitted for further workup.  Assessment/Plan: 1. SIRS/impending sepsis from possible pyelonephritis - patient has been started on empiric antibiotics , resume patient on vancomycin and Primaxin.on-call urologist consulted for further recommendations. Started  patient on IV fluids. Continue to closely monitor in telemetry. Follow blood cultures and urine cultures. 2. Acute renal failure - patient's creatinine has increased from baseline.on IV hydration. Closely follow intake output and metabolic panel. 3. Metabolic acidosis - probably from renal failure and #1. Continue to hydrate aggressively and closely follow metabolic panel. 4. Leukocytosis - follow blood cultures and urine cultures. Probably from #1. Closely follow CBC with differentials. 5. History of bladder cancer - as per urologist. 6. History of hypertension - for now we will continue blood pressure medications and closely follow blood pressure trends. 7. Hyperglycemia with history of diabetes mellitus  2 - patient's wife states that he was recently taken off his diabetic medications 6 months ago. At this time I have placed patient on sliding-scale coverage with insulin. Check hemoglobin A1c. 8. Anemia - closely follow CBC. 9. Recent bladder surgery as described in history of presenting illness for bladder cancer.     Code Status: full code Family Communication: none at bedside, discussed the plan of care with the patient.  Disposition Plan: pending   Consultants:  urology  Procedures:  CT abd and pelvis   Antibiotics:  primaxin 2/4  Vancomycin 2/4  HPI/Subjective: Feeling much better.   Objective: Filed Vitals:   05/10/13 1208  BP: 141/61  Pulse: 113  Temp: 98.7 F (37.1 C)  Resp: 18    Intake/Output Summary (Last 24 hours) at 05/10/13 1532 Last data filed at 05/10/13 1453  Gross per 24 hour  Intake    100 ml  Output   2075 ml  Net  -1975 ml   Filed Weights   05/09/13 1347 05/09/13 2113  Weight: 90.719 kg (200 lb) 95.4 kg (210 lb 5.1 oz)    Exam:   General:  Alert afebrile comfortable  Cardiovascular: s1s2  Respiratory: ctab  Abdomen: soft NT ND BS+ incisions intact.   Musculoskeletal: no pedal edema.   Data Reviewed: Basic Metabolic Panel:  Recent Labs Lab 05/09/13 1349 05/10/13 1130  NA 126* 127*  K 4.9 5.2  CL 90* 97  CO2 19 15*  GLUCOSE 172* 182*  BUN 36* 38*  CREATININE 2.16* 1.96*  CALCIUM 10.0 9.0   Liver Function Tests:  Recent Labs Lab 05/10/13 1130  AST 24  ALT 25  ALKPHOS 86  BILITOT 0.3  PROT 6.8  ALBUMIN 2.2*   No results found for this  basename: LIPASE, AMYLASE,  in the last 168 hours No results found for this basename: AMMONIA,  in the last 168 hours CBC:  Recent Labs Lab 05/09/13 1349 05/10/13 1130  WBC 29.1* 15.4*  NEUTROABS  --  14.1*  HGB 11.5* 9.2*  HCT 32.5* 26.2*  MCV 84.4 83.7  PLT 372 290   Cardiac Enzymes: No results found for this basename: CKTOTAL, CKMB, CKMBINDEX, TROPONINI,  in  the last 168 hours BNP (last 3 results) No results found for this basename: PROBNP,  in the last 8760 hours CBG:  Recent Labs Lab 05/10/13 0246 05/10/13 0809  GLUCAP 173* 163*    Recent Results (from the past 240 hour(s))  CULTURE, BLOOD (ROUTINE X 2)     Status: None   Collection Time    05/09/13  3:00 PM      Result Value Range Status   Specimen Description BLOOD ARM RIGHT   Final   Special Requests BOTTLES DRAWN AEROBIC AND ANAEROBIC 10CC   Final   Culture  Setup Time     Final   Value: 05/09/2013 22:14     Performed at [facility]   Culture     Final   Value: GRAM NEGATIVE RODS     Note: Gram Stain Report Called to,Read Back By and Verified With: Esmond Camper @ [phone] 05/10/13 BY KRAWS     Performed at [facility]   Report Status PENDING   Incomplete  URINE CULTURE     Status: None   Collection Time    05/09/13  3:44 PM      Result Value Range Status   Specimen Description URINE, SUPRAPUBIC   Final   Special Requests NONE   Final   Culture  Setup Time     Final   Value: 05/09/2013 22:25     Performed at [facility] PENDING   Incomplete   Culture     Final   Value: Culture reincubated for better growth     Performed at [facility]   Report Status PENDING   Incomplete  CULTURE, BLOOD (ROUTINE X 2)     Status: None   Collection Time    05/09/13  6:10 PM      Result Value Range Status   Specimen Description BLOOD ARM RIGHT   Final   Special Requests BOTTLES DRAWN AEROBIC AND ANAEROBIC 10CC   Final   Culture  Setup Time     Final   Value: 05/10/2013 00:26     Performed at [facility]   Culture     Final   Value: GRAM NEGATIVE RODS     Note: Gram Stain Report Called to,Read Back By and Verified With: Esmond Camper @ [phone] 05/10/13 BY KRAWS     Performed at [facility]   Report Status PENDING   Incomplete     Studies: Ct Abdomen Pelvis Wo Contrast  05/09/2013   CLINICAL DATA:  Tachycardia.  History  of bladder cancer.  EXAM: CT ABDOMEN AND PELVIS WITHOUT CONTRAST  TECHNIQUE: Multidetector CT imaging of the abdomen and pelvis was performed following the standard protocol without intravenous contrast.  COMPARISON:  11/15/2011  FINDINGS: Atelectasis noted in the right base.  There is no focal liver abnormality. The gallbladder is normal. No biliary dilatation. Normal appearance of the pancreas.  The adrenal glands both appear normal. Bilateral ureteral stents are in place. The left renal collecting system is decompressed. There is  right-sided pelvocaliectasis and perinephric fat stranding. Several small stones are identified within the right renal collecting system. The largest is in the inferior pole measuring 2-3 mm. The patient is status post cystectomy. Bladder reconstruction has been performed. There is a suprapubic catheter within the neobladder as well as a Foley catheter. The ureteral stents exit along the course of the supra pubic catheter. Gas is identified within the neobladder. This is a nonspecific finding and may be related instrumentation. Alternatively this could reflect the presence of anaerobic infection.  The abdominal aorta has a normal caliber. There is no upper abdominal adenopathy identified. Pelvic lymph node dissection has been performed bilaterally. No pelvic or inguinal adenopathy identified.  The stomach appears normal. The small bowel loops have a normal course and caliber and there is no evidence for bowel obstruction. The appendix is visualized and appears normal. Normal appearance of the colon.  Review of the visualized bony structures is significant for multi level degenerative disc disease within the lumbar spine. Bilateral L4 pars defects are identified. There is an anterolisthesis of L4 on L5. Compression fracture involving the T11 vertebra is again noted.  IMPRESSION: 1. Status post cystectomy with neobladder formation. 2. Bilateral ureteral stents are in place. There is  right-sided pelvocaliectasis and perinephric fat stranding which may reflect mild obstructive uropathy or pyelonephritis. Gas is noted within the neobladder which is nonspecific but may be be indicative of an anaerobic infection. 3. Similar appearance of T11 compression fracture.   Electronically Signed   By: Kerby Moors M.D.   On: 05/09/2013 19:08   Dg Chest 2 View  05/09/2013   CLINICAL DATA:  Tachycardia, shortness of breath, history diabetes, bladder cancer  EXAM: CHEST  2 VIEW  COMPARISON:  10/21/2012  FINDINGS: Upper normal heart size.  Mediastinal contours and pulmonary vascularity normal.  Mild right basilar atelectasis.  Lungs otherwise clear.  No pleural effusion or pneumothorax.  Chronic compression deformity of a vertebra at the thoracolumbar junction, approximately T11, question minimally increased.  Bones otherwise unremarkable  IMPRESSION: Mild right basilar atelectasis.  Slightly increased height loss of a vertebra at the thoracolumbar junction, question T11.   Electronically Signed   By: Lavonia Dana M.D.   On: 05/09/2013 14:54   Nm Pulmonary Perf And Vent  05/09/2013   CLINICAL DATA:  Shortness of breath and tachycardia  EXAM: NUCLEAR MEDICINE VENTILATION - PERFUSION LUNG SCAN  Views: Anterior, posterior, left lateral, right lateral, RPO, LPO, RAO, LAO -ventilation and perfusion  Radionuclide: Technetium 23m DTPA -ventilation; Technetium 46m macroaggregated albumin-perfusion  Dose:  40.0 mCi-ventilation; 6.0 mCi- perfusion  Route of administration: Inhalation-ventilation; intravenous- perfusion  COMPARISON:  Chest radiograph May 09, 2013  FINDINGS: Radiotracer uptake on the ventilation study is homogeneous and symmetric bilaterally.  Radiotracer uptake on the perfusion study is homogeneous and symmetric bilaterally.  There is no appreciable ventilation/perfusion mismatch.  IMPRESSION: No ventilation or perfusion abnormalities are appreciated. Very low probability of pulmonary embolus.    Electronically Signed   By: Lowella Grip M.D.   On: 05/09/2013 16:30    Scheduled Meds: . amLODipine  10 mg Oral Daily  . citalopram  40 mg Oral Daily  . enoxaparin (LOVENOX) injection  40 mg Subcutaneous Q24H  . famotidine  20 mg Oral Daily  . hydrALAZINE  10 mg Oral TID  . imipenem-cilastatin  500 mg Intravenous Q8H  . insulin aspart  0-9 Units Subcutaneous TID WC  . polyethylene glycol  17 g Oral Daily  . sodium  chloride  3 mL Intravenous Q12H  . vancomycin  750 mg Intravenous Q12H   Continuous Infusions: . sodium chloride 150 mL/hr at 05/10/13 1454    Principal Problem:   SIRS (systemic inflammatory response syndrome) Active Problems:   Pyelonephritis   HTN (hypertension)   Bladder cancer   Leucocytosis   Tachycardia   ARF (acute renal failure)   Metabolic acidosis    Time spent: 30 min    Jalan Fariss  [facility] Pager [phone]. If 7PM-7AM, please contact night-coverage at [email or web address], password [facility] 05/10/2013, 3:32 PM  LOS: 1 day

## 2013-05-10 NOTE — Progress Notes (Signed)
Utilization review completed. Tamina Cyphers, RN, BSN. 

## 2013-05-10 NOTE — Progress Notes (Signed)
Pt has ureteral stents.the RT stent has a small hole with urine leaking out,coverd with gauze.DR Hal Hope notified no new orders given will continue to [employer].

## 2013-05-11 LAB — CBC
HCT: 28 % — ABNORMAL LOW (ref 39.0–52.0)
Hemoglobin: 9.7 g/dL — ABNORMAL LOW (ref 13.0–17.0)
MCH: 29 pg (ref 26.0–34.0)
MCHC: 34.6 g/dL (ref 30.0–36.0)
MCV: 83.8 fL (ref 78.0–100.0)
PLATELETS: 293 10*3/uL (ref 150–400)
RBC: 3.34 MIL/uL — ABNORMAL LOW (ref 4.22–5.81)
RDW: 13.3 % (ref 11.5–15.5)
WBC: 11.6 10*3/uL — ABNORMAL HIGH (ref 4.0–10.5)

## 2013-05-11 LAB — GLUCOSE, CAPILLARY
GLUCOSE-CAPILLARY: 119 mg/dL — AB (ref 70–99)
GLUCOSE-CAPILLARY: 163 mg/dL — AB (ref 70–99)
Glucose-Capillary: 176 mg/dL — ABNORMAL HIGH (ref 70–99)
Glucose-Capillary: 236 mg/dL — ABNORMAL HIGH (ref 70–99)

## 2013-05-11 LAB — MAGNESIUM: MAGNESIUM: 2.2 mg/dL (ref 1.5–2.5)

## 2013-05-11 LAB — BASIC METABOLIC PANEL
BUN: 35 mg/dL — ABNORMAL HIGH (ref 6–23)
CALCIUM: 8.9 mg/dL (ref 8.4–10.5)
CO2: 16 mEq/L — ABNORMAL LOW (ref 19–32)
Chloride: 97 mEq/L (ref 96–112)
Creatinine, Ser: 1.81 mg/dL — ABNORMAL HIGH (ref 0.50–1.35)
GFR calc Af Amer: 47 mL/min — ABNORMAL LOW (ref >90)
GFR calc non Af Amer: 41 mL/min — ABNORMAL LOW (ref >90)
GLUCOSE: 166 mg/dL — AB (ref 70–99)
Potassium: 4.9 mEq/L (ref 3.7–5.3)
SODIUM: 129 meq/L — AB (ref 137–147)

## 2013-05-11 LAB — HEMOGLOBIN A1C
HEMOGLOBIN A1C: 7.5 % — AB (ref <5.7)
MEAN PLASMA GLUCOSE: 169 mg/dL — AB (ref <117)

## 2013-05-11 MED ORDER — SIMETHICONE 80 MG PO CHEW
80.0000 mg | CHEWABLE_TABLET | Freq: Once | ORAL | Status: AC
  Administered 2013-05-11: 80 mg via ORAL
  Filled 2013-05-11 (×2): qty 1

## 2013-05-11 MED ORDER — INSULIN ASPART 100 UNIT/ML ~~LOC~~ SOLN
3.0000 [IU] | Freq: Three times a day (TID) | SUBCUTANEOUS | Status: DC
  Administered 2013-05-11 – 2013-05-13 (×5): 3 [IU] via SUBCUTANEOUS

## 2013-05-11 NOTE — Evaluation (Signed)
Physical Therapy Evaluation Patient Details Name: [name] [name] MRN: [id number] DOB: [birth year] Today's Date: 05/11/2013 Time: [id number] PT Time Calculation (min): 25 min  PT Assessment / Plan / Recommendation History of Present Illness  [name] [name] is a 53 y.o. male with history of bladder cancer who has had surgery done at [facility] at [place] 2 weeks ago with neobladder placement and bilateral ureteral stent placement with suprapubic catheter was found to be tachycardic since last Tuesday almost a week now. Patient was found to be having mild fever and blood work shows leukocytosis with acute renal failure and metabolic acidosis.   Clinical Impression  Patient presents close to functional baseline.  Still with some tachycardia at rest as well as with ambulation, but no symptoms noted.  Educated need to walk with wife or nursing staff in hall frequently for mobility/strengthening.      PT Assessment  Patent does not need any further PT services    Follow Up Recommendations  No PT follow up          Equipment Recommendations  None recommended by PT          Precautions / Restrictions Precautions Precautions: None   Pertinent Vitals/Pain No pain complaints; HR 113 with ambulation      Mobility  Bed Mobility Overal bed mobility: Modified Independent Transfers Overall transfer level: Modified independent Ambulation/Gait Ambulation/Gait assistance: Independent Ambulation Distance (Feet): 350 Feet Assistive device: None Gait Pattern/deviations: WFL(Within Functional Limits) Gait velocity interpretation: >2.62 ft/sec, indicative of independent community ambulator        PT Goals(Current goals can be found in the care plan section) Acute Rehab PT Goals PT Goal Formulation: No goals set, d/c therapy  Visit Information  Last PT Received On: 05/11/13 Assistance Needed: +1 History of Present Illness: [name] [name] is a 53 y.o. male with history  of bladder cancer who has had surgery done at [facility] at [place] 2 weeks ago with neobladder placement and bilateral ureteral stent placement with suprapubic catheter was found to be tachycardic since last Tuesday almost a week now. Patient was found to be having mild fever and blood work shows leukocytosis with acute renal failure and metabolic acidosis.        Prior [place] expects to be discharged to:: Private residence Living Arrangements: Spouse/significant other;Children Available Help at Discharge: Family Type of Home: House Home Access: Stairs to enter Technical brewer of Steps: 3 Entrance Stairs-Rails: None Home Layout: One level [place]: None Prior Function Level of Independence: Independent Communication Communication: No difficulties Dominant Hand: Right    Cognition  Cognition Arousal/Alertness: Awake/alert Behavior During Therapy: WFL for tasks assessed/performed Overall Cognitive Status: Within Functional Limits for tasks assessed    Extremity/Trunk Assessment Lower Extremity Assessment Lower Extremity Assessment: Overall WFL for tasks assessed   Balance Balance Overall balance assessment: Independent  End of Session PT - End of Session Activity Tolerance: Patient tolerated treatment well Patient left: in chair;with call bell/phone within reach  GP     [facility] 05/11/2013, 5:20 PM [place], [place] 05/11/2013

## 2013-05-11 NOTE — Progress Notes (Signed)
Inpatient Diabetes Program Recommendations  AACE/ADA: New Consensus Statement on Inpatient Glycemic Control (2013)  Target Ranges:  Prepandial:   less than 140 mg/dL      Peak postprandial:   less than 180 mg/dL (1-2 hours)      Critically ill patients:  140 - 180 mg/dL   Reason for Visit: Results for RANVEER, KWIATKOWSKI (MRN [id number]) as of 05/11/2013 11:29  Ref. Range 05/10/2013 02:46 05/10/2013 08:09 05/10/2013 12:07 05/10/2013 21:12 05/11/2013 08:00  Glucose-Capillary Latest Range: 70-99 mg/dL 173 (H) 163 (H) 222 (H) 213 (H) 119 (H)   Diabetes history: History states patient was taken off diabetes medications 6 months ago.  Consider A1C to determine last 2-3 month glycemic control Current orders for Inpatient glycemic control: Sensitive correction tid with meals and HS  May consider adding Novolog meal coverage 3 units tid with meals (while in the hospital).  Based on results of A1C, patient may need to restart oral diabetes agent at discharge.  Thanks, Adah Perl, RN, BC-ADM Inpatient Diabetes Coordinator Pager [phone]

## 2013-05-11 NOTE — Care Management Note (Signed)
   CARE MANAGEMENT NOTE 05/11/2013  Patient:  THEO, FLEECE   Account Number:  [id number]  Date Initiated:  05/11/2013  Documentation initiated by:  Jasmine Pang  Subjective/Objective Assessment:     Action/Plan:   Pt active with [facility] and [facility] is prepared to resume services on Monday May 14, 2013 as needed.   Anticipated DC Date:  05/14/2013   Anticipated DC Plan:  [place]         Choice offered to / List presented to:          [facility] arranged  HH-1 RN      [place]   Status of service:  In process, will continue to follow Medicare Important Message given?   (If response is "NO", the following Medicare IM given date fields will be blank) Date Medicare IM given:   Date Additional Medicare IM given:    Discharge Disposition:    Per UR Regulation:    If discussed at Long Length of Stay Meetings, dates discussed:    Comments:

## 2013-05-11 NOTE — Progress Notes (Addendum)
[facility] PROGRESS NOTE  ZYLEN HOGANSON [id number] DOB: 1960/04/15 DOA: 05/09/2013 PCP: Florina Ou, MD Brief HPI: [name] [name] is a 53 y.o. male with history of bladder cancer who has had surgery done at [place] at [place] 2 weeks ago with neobladder placement and bilateral ureteral stent placement with suprapubic catheter was found to be tachycardic since last Tuesday almost a week now. Patient was instructed to go to the ER. Patient was found to be having mild fever and blood work shows leukocytosis with acute renal failure and metabolic acidosis. Due to his tachycardia VQ scan was done which was showing low probability for PE. CT abdomen pelvis done shows right-sided perinephric stranding concerning for mild obstructive uropathy versus pyelonephritis. There is also gas in the neobladder. Patient was started on empiric antibiotics after blood cultures and urine cultures and placed on IV fluids and has been admitted for further workup.  Assessment/Plan: 1. SIRS/impending sepsis from possible pyelonephritis - patient has been started on empiric antibiotics , resume patient on  Primaxin.on-call urologist consulted for further recommendations. Started  patient on IV fluids. Continue to closely monitor in telemetry.  blood cultures and urine cultures growing gram neg rods. Will d/c IV vancomycin. . 2. Acute renal failure - patient's creatinine has increased from baseline.on IV hydration.  His creatinine i 1.8 much improved . Closely follow intake output and metabolic panel. 3. Metabolic acidosis - probably from renal failure and #1. Continue to hydrate aggressively and closely follow metabolic panel. 4. Leukocytosis - follow blood cultures and urine cultures. Probably from #1. Closely follow CBC with differentials. 5. History of bladder cancer - as per urologist. 6. History of hypertension - for now we will continue blood pressure medications and closely follow blood  pressure trends. 7. Hyperglycemia with history of diabetes mellitus 2 - patient's wife states that he was recently taken off his diabetic medications 6 months ago. At this time I have placed patient on sliding-scale coverage with insulin. Check hemoglobin A1c. 8. Anemia - closely follow CBC. 9. Recent bladder surgery as described in history of presenting illness for bladder cancer.     Code Status: full code Family Communication: none at bedside, discussed the plan of care with the patient.  Disposition Plan: pending   Consultants:  urology  Procedures:  CT abd and pelvis   Antibiotics:  primaxin 2/4  Vancomycin 2/4  HPI/Subjective: Feeling much better.   Objective: Filed Vitals:   05/11/13 1356  BP: 124/63  Pulse: 106  Temp: 98.8 F (37.1 C)  Resp: 20    Intake/Output Summary (Last 24 hours) at 05/11/13 1428 Last data filed at 05/11/13 1357  Gross per 24 hour  Intake 3152.5 ml  Output   3150 ml  Net    2.5 ml   Filed Weights   05/09/13 1347 05/09/13 2113 05/10/13 2024  Weight: 90.719 kg (200 lb) 95.4 kg (210 lb 5.1 oz) 93.396 kg (205 lb 14.4 oz)    Exam:   General:  Alert afebrile comfortable  Cardiovascular: s1s2  Respiratory: ctab  Abdomen: soft NT ND BS+ incisions intact.   Musculoskeletal: no pedal edema.   Data Reviewed: Basic Metabolic Panel:  Recent Labs Lab 05/09/13 1349 05/10/13 1130 05/11/13 0338  NA 126* 127* 129*  K 4.9 5.2 4.9  CL 90* 97 97  CO2 19 15* 16*  GLUCOSE 172* 182* 166*  BUN 36* 38* 35*  CREATININE 2.16* 1.96* 1.81*  CALCIUM 10.0 9.0 8.9  MG  --   --  2.2   Liver Function Tests:  Recent Labs Lab 05/10/13 1130  AST 24  ALT 25  ALKPHOS 86  BILITOT 0.3  PROT 6.8  ALBUMIN 2.2*   No results found for this basename: LIPASE, AMYLASE,  in the last 168 hours No results found for this basename: AMMONIA,  in the last 168 hours CBC:  Recent Labs Lab 05/09/13 1349 05/10/13 1130 05/11/13 0338  WBC 29.1*  15.4* 11.6*  NEUTROABS  --  14.1*  --   HGB 11.5* 9.2* 9.7*  HCT 32.5* 26.2* 28.0*  MCV 84.4 83.7 83.8  PLT 372 290 293   Cardiac Enzymes: No results found for this basename: CKTOTAL, CKMB, CKMBINDEX, TROPONINI,  in the last 168 hours BNP (last 3 results) No results found for this basename: PROBNP,  in the last 8760 hours CBG:  Recent Labs Lab 05/10/13 0809 05/10/13 1207 05/10/13 2112 05/11/13 0800 05/11/13 1211  GLUCAP 163* 222* 213* 119* 176*    Recent Results (from the past 240 hour(s))  CULTURE, BLOOD (ROUTINE X 2)     Status: None   Collection Time    05/09/13  3:00 PM      Result Value Range Status   Specimen Description BLOOD ARM RIGHT   Final   Special Requests BOTTLES DRAWN AEROBIC AND ANAEROBIC 10CC   Final   Culture  Setup Time     Final   Value: 05/09/2013 22:14     Performed at [facility]   Culture     Final   Value: GRAM NEGATIVE RODS     Note: Gram Stain Report Called to,Read Back By and Verified With: Esmond Camper @ [phone] 05/10/13 BY KRAWS     Performed at [facility]   Report Status PENDING   Incomplete  URINE CULTURE     Status: None   Collection Time    05/09/13  3:44 PM      Result Value Range Status   Specimen Description URINE, SUPRAPUBIC   Final   Special Requests NONE   Final   Culture  Setup Time     Final   Value: 05/09/2013 22:25     Performed at [facility] Count     Final   Value: >=100,000 COLONIES/ML     Performed at [facility]   Culture     Final   Value: [place]     Performed at [facility]   Report Status PENDING   Incomplete  URINE CULTURE     Status: None   Collection Time    05/09/13  4:40 PM      Result Value Range Status   Specimen Description URINE, CATHETERIZED   Final   Special Requests NONE   Final   Culture  Setup Time     Final   Value: 05/09/2013 22:20     Performed at [place] [facility] PENDING   Incomplete   Culture      Final   Value: Culture reincubated for better growth     Performed at [facility]   Report Status PENDING   Incomplete  CULTURE, BLOOD (ROUTINE X 2)     Status: None   Collection Time    05/09/13  6:10 PM      Result Value Range Status   Specimen Description BLOOD ARM RIGHT   Final   Special Requests BOTTLES DRAWN AEROBIC AND ANAEROBIC 10CC   Final  Culture  Setup Time     Final   Value: 05/10/2013 00:26     Performed at [facility]   Culture     Final   Value: GRAM NEGATIVE RODS     Note: Gram Stain Report Called to,Read Back By and Verified With: Esmond Camper @ [phone] 05/10/13 BY KRAWS     Performed at [facility]   Report Status PENDING   Incomplete     Studies: Ct Abdomen Pelvis Wo Contrast  05/09/2013   CLINICAL DATA:  Tachycardia.  History of bladder cancer.  EXAM: CT ABDOMEN AND PELVIS WITHOUT CONTRAST  TECHNIQUE: Multidetector CT imaging of the abdomen and pelvis was performed following the standard protocol without intravenous contrast.  COMPARISON:  11/15/2011  FINDINGS: Atelectasis noted in the right base.  There is no focal liver abnormality. The gallbladder is normal. No biliary dilatation. Normal appearance of the pancreas.  The adrenal glands both appear normal. Bilateral ureteral stents are in place. The left renal collecting system is decompressed. There is right-sided pelvocaliectasis and perinephric fat stranding. Several small stones are identified within the right renal collecting system. The largest is in the inferior pole measuring 2-3 mm. The patient is status post cystectomy. Bladder reconstruction has been performed. There is a suprapubic catheter within the neobladder as well as a Foley catheter. The ureteral stents exit along the course of the supra pubic catheter. Gas is identified within the neobladder. This is a nonspecific finding and may be related instrumentation. Alternatively this could reflect the presence of anaerobic infection.   The abdominal aorta has a normal caliber. There is no upper abdominal adenopathy identified. Pelvic lymph node dissection has been performed bilaterally. No pelvic or inguinal adenopathy identified.  The stomach appears normal. The small bowel loops have a normal course and caliber and there is no evidence for bowel obstruction. The appendix is visualized and appears normal. Normal appearance of the colon.  Review of the visualized bony structures is significant for multi level degenerative disc disease within the lumbar spine. Bilateral L4 pars defects are identified. There is an anterolisthesis of L4 on L5. Compression fracture involving the T11 vertebra is again noted.  IMPRESSION: 1. Status post cystectomy with neobladder formation. 2. Bilateral ureteral stents are in place. There is right-sided pelvocaliectasis and perinephric fat stranding which may reflect mild obstructive uropathy or pyelonephritis. Gas is noted within the neobladder which is nonspecific but may be be indicative of an anaerobic infection. 3. Similar appearance of T11 compression fracture.   Electronically Signed   By: Kerby Moors M.D.   On: 05/09/2013 19:08   Dg Chest 2 View  05/09/2013   CLINICAL DATA:  Tachycardia, shortness of breath, history diabetes, bladder cancer  EXAM: CHEST  2 VIEW  COMPARISON:  10/21/2012  FINDINGS: Upper normal heart size.  Mediastinal contours and pulmonary vascularity normal.  Mild right basilar atelectasis.  Lungs otherwise clear.  No pleural effusion or pneumothorax.  Chronic compression deformity of a vertebra at the thoracolumbar junction, approximately T11, question minimally increased.  Bones otherwise unremarkable  IMPRESSION: Mild right basilar atelectasis.  Slightly increased height loss of a vertebra at the thoracolumbar junction, question T11.   Electronically Signed   By: Lavonia Dana M.D.   On: 05/09/2013 14:54   Nm Pulmonary Perf And Vent  05/09/2013   CLINICAL DATA:  Shortness of breath and  tachycardia  EXAM: NUCLEAR MEDICINE VENTILATION - PERFUSION LUNG SCAN  Views: Anterior, posterior, left lateral, right lateral,  RPO, LPO, RAO, LAO -ventilation and perfusion  Radionuclide: Technetium 65m DTPA -ventilation; Technetium 55m macroaggregated albumin-perfusion  Dose:  40.0 mCi-ventilation; 6.0 mCi- perfusion  Route of administration: Inhalation-ventilation; intravenous- perfusion  COMPARISON:  Chest radiograph May 09, 2013  FINDINGS: Radiotracer uptake on the ventilation study is homogeneous and symmetric bilaterally.  Radiotracer uptake on the perfusion study is homogeneous and symmetric bilaterally.  There is no appreciable ventilation/perfusion mismatch.  IMPRESSION: No ventilation or perfusion abnormalities are appreciated. Very low probability of pulmonary embolus.   Electronically Signed   By: Lowella Grip M.D.   On: 05/09/2013 16:30    Scheduled Meds: . amLODipine  10 mg Oral Daily  . citalopram  40 mg Oral Daily  . enoxaparin (LOVENOX) injection  40 mg Subcutaneous Q24H  . famotidine  20 mg Oral Daily  . hydrALAZINE  10 mg Oral TID  . imipenem-cilastatin  500 mg Intravenous Q8H  . insulin aspart  0-9 Units Subcutaneous TID WC  . insulin aspart  3 Units Subcutaneous TID WC  . polyethylene glycol  17 g Oral Daily  . sodium chloride  3 mL Intravenous Q12H  . vancomycin  750 mg Intravenous Q12H   Continuous Infusions:    Principal Problem:   SIRS (systemic inflammatory response syndrome) Active Problems:   Pyelonephritis   HTN (hypertension)   Bladder cancer   Leucocytosis   Tachycardia   ARF (acute renal failure)   Metabolic acidosis    Time spent: 30 min    Baylon Santelli  [facility] Pager [phone]. If 7PM-7AM, please contact night-coverage at [email or web address], password [facility] 05/11/2013, 2:28 PM  LOS: 2 days

## 2013-05-11 NOTE — Progress Notes (Signed)
Urology Progress Note  Subjective:     No acute urologic events overnight. Complains of constipation. Flushing bladder with ease. No abdominal pain.  ROS: Positive: SOB. Negative: chest pain  Objective:  Patient Vitals for the past 24 hrs:  BP Temp Temp src Pulse Resp SpO2 Height Weight  05/11/13 0525 136/68 mmHg 98.8 F (37.1 C) Oral 102 20 98 % - -  05/10/13 2024 133/64 mmHg 99.9 F (37.7 C) Oral 107 20 100 % 5\' 10"  (1.778 m) 93.396 kg (205 lb 14.4 oz)  05/10/13 1811 128/66 mmHg 98.6 F (37 C) Oral 111 19 97 % - -  05/10/13 1208 141/61 mmHg 98.7 F (37.1 C) Oral 113 18 97 % - -  05/10/13 0805 120/62 mmHg 99.8 F (37.7 C) Oral 108 18 96 % - -    Physical Exam: General:  No acute distress, awake Cardiovascular:    [x]   S1/S2 present, mildly tachycardic.  []   Irregularly irregular Chest:  CTA-B Abdomen:               []  Soft, appropriately TTP  []  Soft, NTTP  [x]  Soft, appropriately TTP, incision(s) clean/dry/intact  Genitourinary: Negative genital edema. Urine draining from both the SP tube and urethral catheter.     I/O last 3 completed shifts: In: 3597.1 [P.O.:720; I.V.:2777.1; IV Piggyback:100] Out: 3575 [Urine:3575]  Recent Labs     05/10/13  1130  05/11/13  0338  HGB  9.2*  9.7*  WBC  15.4*  11.6*  PLT  290  293    Recent Labs     05/10/13  1130  05/11/13  0338  NA  127*  129*  K  5.2  4.9  CL  97  97  CO2  15*  16*  BUN  38*  35*  CREATININE  1.96*  1.81*  CALCIUM  9.0  8.9  GFRNONAA  37*  41*  GFRAA  43*  47*     No results found for this basename: PT, INR, APTT,  in the last 72 hours   No components found with this basename: ABG,     Length of stay: 2 days.  Assessment: Sepsis. Dehydration. Pyelonephritis S/p radical cystectomy w/ neobladder in [place] 04/2013.   Plan: Continue broad spectrum antibiotics and IV fluids.  Continue bowel regiment.  No indication for surgical intervention as CT shows stents in good position and  no abscess.  Will follow.  Patient will continue flushing neobladder as instructed in [place].   Rolan Bucco, MD [phone]

## 2013-05-12 LAB — URINE CULTURE: Colony Count: >=100000

## 2013-05-12 LAB — CULTURE, BLOOD (ROUTINE X 2)

## 2013-05-12 LAB — GLUCOSE, CAPILLARY
GLUCOSE-CAPILLARY: 143 mg/dL — AB (ref 70–99)
GLUCOSE-CAPILLARY: 205 mg/dL — AB (ref 70–99)
Glucose-Capillary: 157 mg/dL — ABNORMAL HIGH (ref 70–99)
Glucose-Capillary: 223 mg/dL — ABNORMAL HIGH (ref 70–99)

## 2013-05-12 LAB — BASIC METABOLIC PANEL
BUN: 36 mg/dL — ABNORMAL HIGH (ref 6–23)
CALCIUM: 9.3 mg/dL (ref 8.4–10.5)
CHLORIDE: 95 meq/L — AB (ref 96–112)
CO2: 17 mEq/L — ABNORMAL LOW (ref 19–32)
CREATININE: 1.66 mg/dL — AB (ref 0.50–1.35)
GFR calc Af Amer: 52 mL/min — ABNORMAL LOW (ref >90)
GFR calc non Af Amer: 45 mL/min — ABNORMAL LOW (ref >90)
Glucose, Bld: 219 mg/dL — ABNORMAL HIGH (ref 70–99)
Potassium: 4.8 mEq/L (ref 3.7–5.3)
Sodium: 128 mEq/L — ABNORMAL LOW (ref 137–147)

## 2013-05-12 MED ORDER — PIPERACILLIN-TAZOBACTAM 3.375 G IVPB
3.3750 g | Freq: Three times a day (TID) | INTRAVENOUS | Status: DC
  Administered 2013-05-12 – 2013-05-13 (×4): 3.375 g via INTRAVENOUS
  Filled 2013-05-12 (×6): qty 50

## 2013-05-12 NOTE — Progress Notes (Signed)
Subjective:  1 - Bladder Cancer s/p Cystectomy with Neobladder - s/p open cystectomy with neobladder in [place] approx 2 weeks ago. Doing well with daily to twice daily flushing with foley + SP tube. Ct trending down.   2 - Pyelonephritis, Gram Negative Bacteremia - Currently admitted with malaise, SIRS. UCX and [place] 05/09/13 confirm klebsiella. Repeat BCX pending.   Objective: Vital signs in last 24 hours: Temp:  [97.8 F (36.6 C)-99.3 F (37.4 C)] 97.9 F (36.6 C) (02/07 1723) Pulse Rate:  [60-103] 60 (02/07 1723) Resp:  [17-18] 18 (02/07 1723) BP: (116-147)/(56-76) 118/57 mmHg (02/07 1723) SpO2:  [95 %-100 %] 97 % (02/07 1723) Weight:  [95.8 kg (211 lb 3.2 oz)] 95.8 kg (211 lb 3.2 oz) (02/06 2209) Last BM Date: 05/12/13  Intake/Output from previous day: 02/06 0701 - 02/07 0700 In: 3320 [P.O.:840; IV Piggyback:350] Out: [id number] [Urine:1650] Intake/Output this shift:    General appearance: alert, cooperative and appears stated age Head: Normocephalic, without obvious abnormality, atraumatic Eyes: conjunctivae/corneas clear. PERRL, EOM's intact. Fundi benign. Ears: normal TM's and external ear canals both ears Nose: Nares normal. Septum midline. Mucosa normal. No drainage or sinus tenderness. Throat: lips, mucosa, and tongue normal; teeth and gums normal Neck: no adenopathy, no carotid bruit, no JVD, supple, symmetrical, trachea midline and thyroid not enlarged, symmetric, no tenderness/mass/nodules Back: symmetric, no curvature. ROM normal. No CVA tenderness. Resp: clear to auscultation bilaterally Chest wall: no tenderness Cardio: regular rate and rhythm, S1, S2 normal, no murmur, click, rub or gallop GI: soft, non-tender; bowel sounds normal; no masses,  no organomegaly Male genitalia: normal, Foley c/d/i. Extremities: extremities normal, atraumatic, no cyanosis or edema Pulses: 2+ and symmetric Skin: Skin color, texture, turgor normal. No rashes or lesions Lymph nodes:  Cervical, supraclavicular, and axillary nodes normal. Neurologic: Grossly normal Incision/Wound: SP Tube c/d/i with bander stents connected as well. Mucus in tubing as expected. Recent incision site c/d/i.   Lab Results:   Recent Labs  05/10/13 1130 05/11/13 0338  WBC 15.4* 11.6*  HGB 9.2* 9.7*  HCT 26.2* 28.0*  PLT 290 293   BMET  Recent Labs  05/11/13 0338 05/12/13 0459  NA 129* 128*  K 4.9 4.8  CL 97 95*  CO2 16* 17*  GLUCOSE 166* 219*  BUN 35* 36*  CREATININE 1.81* 1.66*  CALCIUM 8.9 9.3   PT/INR No results found for this basename: LABPROT, INR,  in the last 72 hours ABG No results found for this basename: PHART, PCO2, PO2, HCO3,  in the last 72 hours  Studies/Results: No results found.  Anti-infectives: Anti-infectives   Start     Dose/Rate Route Frequency Ordered Stop   05/12/13 0945  piperacillin-tazobactam (ZOSYN) IVPB 3.375 g     3.375 g 12.5 mL/hr over 240 Minutes Intravenous 3 times per day 05/12/13 0939     05/10/13 0800  vancomycin (VANCOCIN) IVPB 750 mg/150 ml premix  Status:  Discontinued     750 mg 150 mL/hr over 60 Minutes Intravenous Every 12 hours 05/09/13 2254 05/11/13 1431   05/10/13 0400  imipenem-cilastatin (PRIMAXIN) 500 mg in sodium chloride 0.9 % 100 mL IVPB  Status:  Discontinued     500 mg 200 mL/hr over 30 Minutes Intravenous 3 times per day 05/09/13 2254 05/12/13 0938   05/09/13 1915  piperacillin-tazobactam (ZOSYN) IVPB 3.375 g     3.375 g 100 mL/hr over 30 Minutes Intravenous  Once 05/09/13 1912 05/09/13 2002   05/09/13 1915  vancomycin (VANCOCIN) IVPB 1000 mg/200  mL premix     1,000 mg 200 mL/hr over 60 Minutes Intravenous  Once 05/09/13 1912 05/10/13 0930      Assessment/Plan:   1 - Bladder Cancer s/p Cystectomy with Neobladder - Overall doing very well post-op, compliant with flushing. Cr stable and improving as well. Stable to DC home from GU perspective at anytime. Has f/u in [place] for cystogram and likely tube  removal next week.  2 - Pyelonephritis, Gram Negative Bacteremia - Agree with current ABX and hopefuly bridge to PO soon.  3 - Will follow.   [facility], [place]ne Zerkle 05/12/2013

## 2013-05-12 NOTE — Progress Notes (Signed)
Patient ID: [name] [name]  male  [id number]    DOB: 08/12/1960    DOA: 05/09/2013  PCP: Florina Ou, MD  Assessment/Plan: Principal Problem: gram-negative sepsis from Pyelonephritis, leukocytosis, tachycardia - Vancomycin discontinued, change to IV imipenem to Zosyn based on sensitivities - Repeat Blood cultures today, if repeat blood cultures continue to stay negative, we'll change to oral antibiotics tomorrow, based on sensitivities, likely ciprofloxacin/levofloxacin and continue for at least 2 weeks due to sepsis  Active Problems:    HTN (hypertension) - Currently stable    Bladder cancer - Patient has next appointment at [place] at [place] on Tuesday next week    Metabolic acidosis- improving   DVT Prophylaxis:  Code Status:  Family Communication: Patient alert and oriented x3, discussed in detail with patient   Disposition: likely in a.m.     Subjective:  denies any specific complaints, no fever chills or abdominal pain   Objective: Weight change: 2.404 kg (5 lb 4.8 oz)  Intake/Output Summary (Last 24 hours) at 05/12/13 1152 Last data filed at 05/12/13 1136  Gross per 24 hour  Intake   2810 ml  Output   1500 ml  Net   1310 ml   Blood pressure 116/56, pulse 92, temperature 97.8 F (36.6 C), temperature source Oral, resp. rate 18, height 5\' 10"  (1.778 m), weight 95.8 kg (211 lb 3.2 oz), SpO2 100.00%.  Physical Exam: General: Alert and awake, oriented x3, not in any acute distress. CVS: S1-S2 clear, no murmur rubs or gallops Chest: clear to auscultation bilaterally, no wheezing, rales or rhonchi Abdomen: soft staples intact nondistended, normal bowel sounds  Extremities: no cyanosis, clubbing or edema noted bilaterally Neuro: Cranial nerves II-XII intact, no focal neurological deficits  Lab Results: Basic Metabolic Panel:  Recent Labs Lab 05/11/13 0338 05/12/13 0459  NA 129* 128*  K 4.9 4.8  CL 97 95*  CO2 16* 17*  GLUCOSE 166*  219*  BUN 35* 36*  CREATININE 1.81* 1.66*  CALCIUM 8.9 9.3  MG 2.2  --    Liver Function Tests:  Recent Labs Lab 05/10/13 1130  AST 24  ALT 25  ALKPHOS 86  BILITOT 0.3  PROT 6.8  ALBUMIN 2.2*   No results found for this basename: LIPASE, AMYLASE,  in the last 168 hours No results found for this basename: AMMONIA,  in the last 168 hours CBC:  Recent Labs Lab 05/10/13 1130 05/11/13 0338  WBC 15.4* 11.6*  NEUTROABS 14.1*  --   HGB 9.2* 9.7*  HCT 26.2* 28.0*  MCV 83.7 83.8  PLT 290 293   Cardiac Enzymes: No results found for this basename: CKTOTAL, CKMB, CKMBINDEX, TROPONINI,  in the last 168 hours BNP: No components found with this basename: POCBNP,  CBG:  Recent Labs Lab 05/11/13 0800 05/11/13 1211 05/11/13 1710 05/11/13 2143 05/12/13 0745  GLUCAP 119* 176* 163* 236* 157*     Micro Results: Recent Results (from the past 240 hour(s))  CULTURE, BLOOD (ROUTINE X 2)     Status: None   Collection Time    05/09/13  3:00 PM      Result Value Range Status   Specimen Description BLOOD ARM RIGHT   Final   Special Requests BOTTLES DRAWN AEROBIC AND ANAEROBIC 10CC   Final   Culture  Setup Time     Final   Value: 05/09/2013 22:14     Performed at [facility]   Culture     Final   Value:  KLEBSIELLA PNEUMONIAE     Note: Gram Stain Report Called to,Read Back By and Verified With: Esmond Camper @ [phone] 05/10/13 BY KRAWS     Performed at [facility]   Report Status 05/12/2013 FINAL   Final   Organism ID, Bacteria KLEBSIELLA PNEUMONIAE   Final  URINE CULTURE     Status: None   Collection Time    05/09/13  3:44 PM      Result Value Range Status   Specimen Description URINE, SUPRAPUBIC   Final   Special Requests NONE   Final   Culture  Setup Time     Final   Value: 05/09/2013 22:25     Performed at [place]     Final   Value: >=100,000 COLONIES/ML     Performed at [facility]   Culture     Final   Value:  CITROBACTER [place]     Performed at [facility]   Report Status 05/12/2013 FINAL   Final   Organism ID, Bacteria CITROBACTER FREUNDII   Final   Organism ID, Bacteria KLEBSIELLA PNEUMONIAE   Final  URINE CULTURE     Status: None   Collection Time    05/09/13  4:40 PM      Result Value Range Status   Specimen Description URINE, CATHETERIZED   Final   Special Requests NONE   Final   Culture  Setup Time     Final   Value: 05/09/2013 22:20     Performed at [place] PENDING   Incomplete   Culture     Final   Value: Culture reincubated for better growth     Performed at [facility]   Report Status PENDING   Incomplete  CULTURE, BLOOD (ROUTINE X 2)     Status: None   Collection Time    05/09/13  6:10 PM      Result Value Range Status   Specimen Description BLOOD ARM RIGHT   Final   Special Requests BOTTLES DRAWN AEROBIC AND ANAEROBIC 10CC   Final   Culture  Setup Time     Final   Value: 05/10/2013 00:26     Performed at [facility]   Culture     Final   Value: KLEBSIELLA PNEUMONIAE     Note: SUSCEPTIBILITIES PERFORMED ON PREVIOUS CULTURE WITHIN THE LAST 5 DAYS.     Note: Gram Stain Report Called to,Read Back By and Verified With: Esmond Camper @ [phone] 05/10/13 BY KRAWS     Performed at [facility]   Report Status 05/12/2013 FINAL   Final    Studies/Results: Ct Abdomen Pelvis Wo Contrast  05/09/2013   CLINICAL DATA:  Tachycardia.  History of bladder cancer.  EXAM: CT ABDOMEN AND PELVIS WITHOUT CONTRAST  TECHNIQUE: Multidetector CT imaging of the abdomen and pelvis was performed following the standard protocol without intravenous contrast.  COMPARISON:  11/15/2011  FINDINGS: Atelectasis noted in the right base.  There is no focal liver abnormality. The gallbladder is normal. No biliary dilatation. Normal appearance of the pancreas.  The adrenal glands both appear normal. Bilateral ureteral stents are  in place. The left renal collecting system is decompressed. There is right-sided pelvocaliectasis and perinephric fat stranding. Several small stones are identified within the right renal collecting system. The largest is in the inferior pole measuring 2-3 mm. The patient is status post cystectomy.  Bladder reconstruction has been performed. There is a suprapubic catheter within the neobladder as well as a Foley catheter. The ureteral stents exit along the course of the supra pubic catheter. Gas is identified within the neobladder. This is a nonspecific finding and may be related instrumentation. Alternatively this could reflect the presence of anaerobic infection.  The abdominal aorta has a normal caliber. There is no upper abdominal adenopathy identified. Pelvic lymph node dissection has been performed bilaterally. No pelvic or inguinal adenopathy identified.  The stomach appears normal. The small bowel loops have a normal course and caliber and there is no evidence for bowel obstruction. The appendix is visualized and appears normal. Normal appearance of the colon.  Review of the visualized bony structures is significant for multi level degenerative disc disease within the lumbar spine. Bilateral L4 pars defects are identified. There is an anterolisthesis of L4 on L5. Compression fracture involving the T11 vertebra is again noted.  IMPRESSION: 1. Status post cystectomy with neobladder formation. 2. Bilateral ureteral stents are in place. There is right-sided pelvocaliectasis and perinephric fat stranding which may reflect mild obstructive uropathy or pyelonephritis. Gas is noted within the neobladder which is nonspecific but may be be indicative of an anaerobic infection. 3. Similar appearance of T11 compression fracture.   Electronically Signed   By: Kerby Moors M.D.   On: 05/09/2013 19:08   Dg Chest 2 View  05/09/2013   CLINICAL DATA:  Tachycardia, shortness of breath, history diabetes, bladder cancer   EXAM: CHEST  2 VIEW  COMPARISON:  10/21/2012  FINDINGS: Upper normal heart size.  Mediastinal contours and pulmonary vascularity normal.  Mild right basilar atelectasis.  Lungs otherwise clear.  No pleural effusion or pneumothorax.  Chronic compression deformity of a vertebra at the thoracolumbar junction, approximately T11, question minimally increased.  Bones otherwise unremarkable  IMPRESSION: Mild right basilar atelectasis.  Slightly increased height loss of a vertebra at the thoracolumbar junction, question T11.   Electronically Signed   By: Lavonia Dana M.D.   On: 05/09/2013 14:54   Nm Pulmonary Perf And Vent  05/09/2013   CLINICAL DATA:  Shortness of breath and tachycardia  EXAM: NUCLEAR MEDICINE VENTILATION - PERFUSION LUNG SCAN  Views: Anterior, posterior, left lateral, right lateral, RPO, LPO, RAO, LAO -ventilation and perfusion  Radionuclide: Technetium 46m DTPA -ventilation; Technetium 59m macroaggregated albumin-perfusion  Dose:  40.0 mCi-ventilation; 6.0 mCi- perfusion  Route of administration: Inhalation-ventilation; intravenous- perfusion  COMPARISON:  Chest radiograph May 09, 2013  FINDINGS: Radiotracer uptake on the ventilation study is homogeneous and symmetric bilaterally.  Radiotracer uptake on the perfusion study is homogeneous and symmetric bilaterally.  There is no appreciable ventilation/perfusion mismatch.  IMPRESSION: No ventilation or perfusion abnormalities are appreciated. Very low probability of pulmonary embolus.   Electronically Signed   By: Lowella Grip M.D.   On: 05/09/2013 16:30    Medications: Scheduled Meds: . amLODipine  10 mg Oral Daily  . citalopram  40 mg Oral Daily  . enoxaparin (LOVENOX) injection  40 mg Subcutaneous Q24H  . famotidine  20 mg Oral Daily  . hydrALAZINE  10 mg Oral TID  . insulin aspart  0-9 Units Subcutaneous TID WC  . insulin aspart  3 Units Subcutaneous TID WC  . piperacillin-tazobactam (ZOSYN)  IV  3.375 g Intravenous Q8H  .  polyethylene glycol  17 g Oral Daily  . sodium chloride  3 mL Intravenous Q12H      LOS: 3 days   Angely Dietz M.D. [facility] 05/12/2013, 11:52 AM Pager: [phone]  If 7PM-7AM, please contact night-coverage [email or web address] Password [email or web address] .

## 2013-05-12 NOTE — Progress Notes (Signed)
ANTIBIOTIC CONSULT NOTE - INITIAL  Pharmacy Consult for Zosyn Indication: Urosepsis  No Known Allergies  Patient Measurements: Height: 5\' 10"  (177.8 cm) (per patient) Weight: 211 lb 3.2 oz (95.8 kg) IBW/kg (Calculated) : 73  Vital Signs: Temp: 97.8 F (36.6 C) (02/07 0908) Temp src: Oral (02/07 0442) BP: 116/56 mmHg (02/07 0908) Pulse Rate: 92 (02/07 0908) Intake/Output from previous day: 02/06 0701 - 02/07 0700 In: 3320 [P.O.:840; IV Piggyback:350] Out: [id number] [Urine:1650] Intake/Output from this shift:    Labs:  Recent Labs  05/09/13 1349 05/10/13 1130 05/11/13 0338 05/12/13 0459  WBC 29.1* 15.4* 11.6*  --   HGB 11.5* 9.2* 9.7*  --   PLT 372 290 293  --   CREATININE 2.16* 1.96* 1.81* 1.66*   Estimated Creatinine Clearance: 59.1 ml/min (by C-G formula based on Cr of 1.66). No results found for this basename: VANCOTROUGH, VANCOPEAK, VANCORANDOM, [place], GENTPEAK, GENTRANDOM, TOBRATROUGH, TOBRAPEAK, TOBRARND, AMIKACINPEAK, AMIKACINTROU, AMIKACIN,  in the last 72 hours   Microbiology: Recent Results (from the past 720 hour(s))  CULTURE, BLOOD (ROUTINE X 2)     Status: None   Collection Time    05/09/13  3:00 PM      Result Value Range Status   Specimen Description BLOOD ARM RIGHT   Final   Special Requests BOTTLES DRAWN AEROBIC AND ANAEROBIC 10CC   Final   Culture  Setup Time     Final   Value: 05/09/2013 22:14     Performed at [facility]   Culture     Final   Value: KLEBSIELLA PNEUMONIAE     Note: Gram Stain Report Called to,Read Back By and Verified With: Esmond Camper @ [phone] 05/10/13 BY KRAWS     Performed at [facility]   Report Status 05/12/2013 FINAL   Final   Organism ID, Bacteria KLEBSIELLA PNEUMONIAE   Final  URINE CULTURE     Status: None   Collection Time    05/09/13  3:44 PM      Result Value Range Status   Specimen Description URINE, SUPRAPUBIC   Final   Special Requests NONE   Final   Culture  Setup Time     Final   Value: 05/09/2013 22:25     Performed at [place]     Final   Value: >=100,000 COLONIES/ML     Performed at [facility]   Culture     Final   Value: CITROBACTER [place]     Performed at [facility]   Report Status 05/12/2013 FINAL   Final   Organism ID, Bacteria CITROBACTER FREUNDII   Final   Organism ID, Bacteria KLEBSIELLA PNEUMONIAE   Final  URINE CULTURE     Status: None   Collection Time    05/09/13  4:40 PM      Result Value Range Status   Specimen Description URINE, CATHETERIZED   Final   Special Requests NONE   Final   Culture  Setup Time     Final   Value: 05/09/2013 22:20     Performed at [place] PENDING   Incomplete   Culture     Final   Value: Culture reincubated for better growth     Performed at [facility]   Report Status PENDING   Incomplete  CULTURE, BLOOD (ROUTINE X 2)     Status: None   Collection Time  05/09/13  6:10 PM      Result Value Range Status   Specimen Description BLOOD ARM RIGHT   Final   Special Requests BOTTLES DRAWN AEROBIC AND ANAEROBIC 10CC   Final   Culture  Setup Time     Final   Value: 05/10/2013 00:26     Performed at [facility]   Culture     Final   Value: KLEBSIELLA PNEUMONIAE     Note: SUSCEPTIBILITIES PERFORMED ON PREVIOUS CULTURE WITHIN THE LAST 5 DAYS.     Note: Gram Stain Report Called to,Read Back By and Verified With: Esmond Camper @ [phone] 05/10/13 BY KRAWS     Performed at [facility]   Report Status 05/12/2013 FINAL   Final    Medical History: Past Medical History  Diagnosis Date  . Gout     recent flair -bilateral feet--  STABLE  . Frequency of urination   . Urgency incontinence   . Nocturia   . Diabetes mellitus type 2, diet-controlled PT STOPPED TAKING METFORMIN--  HAS BEEN WATCHING DIET, EXERCISING AND LOSING WT  . Bladder cancer 2013  . Complication of anesthesia     agitation  w/awakening in 9/13,OK 01/24/12  . Hypertension   . Anxiety   . Depression   . GERD (gastroesophageal reflux disease)     Medications:  Scheduled:  . amLODipine  10 mg Oral Daily  . citalopram  40 mg Oral Daily  . enoxaparin (LOVENOX) injection  40 mg Subcutaneous Q24H  . famotidine  20 mg Oral Daily  . hydrALAZINE  10 mg Oral TID  . insulin aspart  0-9 Units Subcutaneous TID WC  . insulin aspart  3 Units Subcutaneous TID WC  . piperacillin-tazobactam (ZOSYN)  IV  3.375 g Intravenous Q8H  . polyethylene glycol  17 g Oral Daily  . sodium chloride  3 mL Intravenous Q12H   Assessment: 53 yr old male sent to ED by home health for tachycardia. He had his bladder removed 2 weeks ago and has stents and catheters. UA positive for UTI. CT shows pelvocaliectasis and peripheric fat stranding which may reflect obstruction or pyelonephritis. Patient previously on Vanc and primaxin. Vanc d/c upon culture reporting gram negative rods. Urine susceptibilities to Rocephin, FQ, Zosyn and bactrim. Patient is afebrile, wbc wnl, and renal function improving.  Primaxin 2/5>>2/7 Vanc 2/5>>2/6 Zosyn 2/7>>  2/5 Urine Cx >> Citrobacter and Kleb pneumo (Sus> CTX, FQ, Zosyn, and bactrim) 2/5 Blood Cx x2>> gram negative rods    Goal of Therapy:  Vancomycin trough level 15-20 mcg/ml  Plan:  1) Switching to Zosyn 3.375 IV q8h (ext infusion) 2) F/u CBC, cultures, renal fx and patient clinical status 3) F/u on repeat bcx  Posey Pronto, December Hedtke M 05/12/2013,9:51 AM

## 2013-05-13 LAB — BASIC METABOLIC PANEL
BUN: 41 mg/dL — ABNORMAL HIGH (ref 6–23)
CALCIUM: 9.7 mg/dL (ref 8.4–10.5)
CO2: 18 mEq/L — ABNORMAL LOW (ref 19–32)
CREATININE: 1.89 mg/dL — AB (ref 0.50–1.35)
Chloride: 97 mEq/L (ref 96–112)
GFR calc non Af Amer: 39 mL/min — ABNORMAL LOW (ref >90)
GFR, EST AFRICAN AMERICAN: 45 mL/min — AB (ref >90)
Glucose, Bld: 146 mg/dL — ABNORMAL HIGH (ref 70–99)
Potassium: 5.1 mEq/L (ref 3.7–5.3)
Sodium: 132 mEq/L — ABNORMAL LOW (ref 137–147)

## 2013-05-13 LAB — CBC
HCT: 27.7 % — ABNORMAL LOW (ref 39.0–52.0)
Hemoglobin: 9.7 g/dL — ABNORMAL LOW (ref 13.0–17.0)
MCH: 29.6 pg (ref 26.0–34.0)
MCHC: 35 g/dL (ref 30.0–36.0)
MCV: 84.5 fL (ref 78.0–100.0)
Platelets: 378 10*3/uL (ref 150–400)
RBC: 3.28 MIL/uL — ABNORMAL LOW (ref 4.22–5.81)
RDW: 13.4 % (ref 11.5–15.5)
WBC: 12.7 10*3/uL — ABNORMAL HIGH (ref 4.0–10.5)

## 2013-05-13 LAB — URINE CULTURE: Colony Count: >=100000

## 2013-05-13 LAB — GLUCOSE, CAPILLARY
GLUCOSE-CAPILLARY: 215 mg/dL — AB (ref 70–99)
Glucose-Capillary: 141 mg/dL — ABNORMAL HIGH (ref 70–99)

## 2013-05-13 MED ORDER — LORAZEPAM 1 MG PO TABS: 1.0000 mg | ORAL_TABLET | Freq: Every day | ORAL | Status: DC | PRN

## 2013-05-13 MED ORDER — NITROFURANTOIN MACROCRYSTAL 100 MG PO CAPS: 100.0000 mg | ORAL_CAPSULE | Freq: Every day | ORAL | Status: DC

## 2013-05-13 MED ORDER — LEVOFLOXACIN 750 MG PO TABS
750.0000 mg | ORAL_TABLET | Freq: Once | ORAL | Status: AC
  Administered 2013-05-13: 750 mg via ORAL
  Filled 2013-05-13: qty 1

## 2013-05-13 MED ORDER — GLIMEPIRIDE 2 MG PO TABS: 2.0000 mg | ORAL_TABLET | Freq: Every day | ORAL | Status: DC

## 2013-05-13 MED ORDER — LEVOFLOXACIN 750 MG PO TABS: 750.0000 mg | ORAL_TABLET | Freq: Every day | ORAL | Status: DC

## 2013-05-13 MED ORDER — SENNOSIDES-DOCUSATE SODIUM 8.6-50 MG PO TABS: 2.0000 | ORAL_TABLET | Freq: Two times a day (BID) | ORAL | Status: DC | PRN

## 2013-05-13 NOTE — Progress Notes (Signed)
MD requested for patient to self irrigate his foley. Gave the patient the proper equipment to flush his foley. He insisted that I flush since the equipment was not what he was use to at home. Flushed his foley with 60cc with no issues. Patient tolerated it well. Will continue to monitor and assess the patient.   Byrd Hesselbach, BSN  [place] [address] [place] (late entry)

## 2013-05-13 NOTE — Discharge Summary (Signed)
Physician Discharge Summary  Patient ID: [name] MRN: [id number] DOB/AGE: 10-Dec-1960 53 y.o.  Admit date: 05/09/2013 Discharge date: 05/13/2013  Primary Care Physician:  Florina Ou, MD  Discharge Diagnoses:    Klebsiella pneumonia sepsis with bacteremia secondary to Klebsiella pyelonephritis  . Pyelonephritis secondary to Klebsiella and Citrobacter  . SIRS (systemic inflammatory response syndrome) resolved  . HTN (hypertension) . Bladder cancer . Leucocytosis improving  . Tachycardia resolved  . ARF (acute renal failure) improving  . Metabolic acidosis Diabetes mellitus newly diagnosed  Consults:  Urology, DR Annamaria Boots   Recommendations for Outpatient Follow-up:   Please followup on second set of blood cultures, so far negative  Please check BMET, CBC at the time of follow up appointment  Please discuss with the patient regarding diabetic management and hemoglobin A1c. Patient is started on Amaryl 2 mg daily with breakfast  Allergies:  No Known Allergies   Discharge Medications:   Medication List         amLODipine 10 MG tablet  Commonly known as:  NORVASC  Take 10 mg by mouth daily.     citalopram 40 MG tablet  Commonly known as:  CELEXA  Take 40 mg by mouth daily.     famotidine 20 MG tablet  Commonly known as:  PEPCID  Take 20 mg by mouth daily.     glimepiride 2 MG tablet  Commonly known as:  AMARYL  Take 1 tablet (2 mg total) by mouth daily with breakfast.     hydrALAZINE 10 MG tablet  Commonly known as:  APRESOLINE  Take 10 mg by mouth 3 (three) times daily.     hyoscyamine 0.125 MG tablet  Commonly known as:  LEVSIN, ANASPAZ  Take 1 tablet (0.125 mg total) by mouth every 4 (four) hours as needed for cramping (bladder spasms).     levofloxacin 750 MG tablet  Commonly known as:  LEVAQUIN  Take 1 tablet (750 mg total) by mouth daily. X 14 DAYS     LORazepam 1 MG tablet  Commonly known as:  ATIVAN  Take 1 tablet (1 mg total) by mouth daily  as needed for anxiety.     nitrofurantoin 100 MG capsule  Commonly known as:  MACRODANTIN  Take 1 capsule (100 mg total) by mouth daily. Please HOLD nitrofurantoin while you are on levaquin antibiotic     oxyCODONE-acetaminophen 5-325 MG per tablet  Commonly known as:  PERCOCET/ROXICET  Take 1 tablet by mouth every 6 (six) hours as needed for moderate pain or severe pain.     senna-docusate 8.6-50 MG per tablet  Commonly known as:  Senokot-S  Take 2 tablets by mouth 2 (two) times daily as needed for mild constipation.         Brief H and P: For complete details please refer to admission H and P, but in brief, [name] [name] is a 53 y.o. male with history of bladder cancer who has had surgery done at [facility] at [place] 2 weeks ago with neobladder placement and bilateral ureteral stent placement with suprapubic catheter was found to be tachycardic since last Tuesday almost a week now. Patient was instructed to go to the ER. Patient was found to be having mild fever and blood work shows leukocytosis with acute renal failure and metabolic acidosis. Due to his tachycardia VQ scan was done which was showing low probability for PE. CT abdomen pelvis done shows right-sided perinephric stranding concerning for mild obstructive uropathy versus pyelonephritis.  There is also gas in the neobladder. Patient was started on empiric antibiotics after blood cultures and urine cultures and placed on IV fluids and has been admitted for further workup   Hospital Course:   [name] [name] is a 53 y.o. male with history of bladder cancer who has had surgery done at [facility] at [place] 2 weeks ago with neobladder placement and bilateral ureteral stent placement with suprapubic catheter was found to be tachycardic since last Tuesday almost a week now. Patient was instructed to go to the ER. Patient was found to be having mild fever and blood work showed leukocytosis with acute  renal failure and metabolic acidosis. Due to his tachycardia VQ scan was done which was showing low probability for PE. CT abdomen pelvis done shows right-sided perinephric stranding concerning for mild obstructive uropathy versus pyelonephritis, gas in the neobladder. Patient was started on empiric antibiotics after blood cultures and urine cultures and placed on IV fluids and was admitted for further workup.  Klebsiella sepsis with bacteremia  from Pyelonephritis, leukocytosis, tachycardia : Patient was initially placed on vancomycin and IV him in panel for broad-spectrum coverage. Urine cultures showed Klebsiella and Citrobacter, blood cultures on 05/09/13 showed Klebsiella pneumoniae. Based on the sensitivities vancomycin was discontinued and patient was placed on IV Zosyn. Repeat blood cultures 2/7 so far has been negative. Patient was placed on oral levofloxacin for 2 weeks.  He will followup with his urologist and at [facility] in [place].    HTN (hypertension) - Currently stable   Bladder cancer - Patient has next appointment at [facility] at [place] on Tuesday next week   Acute renal failure with metabolic acidosis : From sepsis, pyelonephritis, improving please obtain a CBC, BMET at the time of appointment   Diabetes mellitus: During hospitalization patient was found to have hyperglycemia, was started on sliding scale insulin. Hemoglobin A1c was 7.5. He was placed on Amaryl 2 mg daily at discharge  Day of Discharge BP 113/66  Pulse 88  Temp(Src) 98.4 F (36.9 C) (Oral)  Resp 18  Ht 5\' 10"  (1.778 m)  Wt 95.8 kg (211 lb 3.2 oz)  BMI 30.30 kg/m2  SpO2 97%  Physical Exam: General: Alert and awake oriented x3 not in any acute distress. CVS: S1-S2 clear no murmur rubs or gallops Chest: clear to auscultation bilaterally, no wheezing rales or rhonchi Abdomen: soft nontender, nondistended, normal bowel sounds incisions intact  Extremities: no cyanosis, clubbing or edema  noted bilaterally Neuro: Cranial nerves II-XII intact, no focal neurological deficits   The results of significant diagnostics from this hospitalization (including imaging, microbiology, ancillary and laboratory) are listed below for reference.    LAB RESULTS: Basic Metabolic Panel:  Recent Labs Lab 05/11/13 0338 05/12/13 0459 05/13/13 0530  NA 129* 128* 132*  K 4.9 4.8 5.1  CL 97 95* 97  CO2 16* 17* 18*  GLUCOSE 166* 219* 146*  BUN 35* 36* 41*  CREATININE 1.81* 1.66* 1.89*  CALCIUM 8.9 9.3 9.7  MG 2.2  --   --    Liver Function Tests:  Recent Labs Lab 05/10/13 1130  AST 24  ALT 25  ALKPHOS 86  BILITOT 0.3  PROT 6.8  ALBUMIN 2.2*   No results found for this basename: LIPASE, AMYLASE,  in the last 168 hours No results found for this basename: AMMONIA,  in the last 168 hours CBC:  Recent Labs Lab 05/10/13 1130 05/11/13 0338 05/13/13 0530  WBC  15.4* 11.6* 12.7*  NEUTROABS 14.1*  --   --   HGB 9.2* 9.7* 9.7*  HCT 26.2* 28.0* 27.7*  MCV 83.7 83.8 84.5  PLT 290 293 378   Cardiac Enzymes: No results found for this basename: CKTOTAL, CKMB, CKMBINDEX, TROPONINI,  in the last 168 hours BNP: No components found with this basename: POCBNP,  CBG:  Recent Labs Lab 05/12/13 2114 05/13/13 0724  GLUCAP 205* 141*    Significant Diagnostic Studies:  Ct Abdomen Pelvis Wo Contrast  05/09/2013   CLINICAL DATA:  Tachycardia.  History of bladder cancer.  EXAM: CT ABDOMEN AND PELVIS WITHOUT CONTRAST  TECHNIQUE: Multidetector CT imaging of the abdomen and pelvis was performed following the standard protocol without intravenous contrast.  COMPARISON:  11/15/2011  FINDINGS: Atelectasis noted in the right base.  There is no focal liver abnormality. The gallbladder is normal. No biliary dilatation. Normal appearance of the pancreas.  The adrenal glands both appear normal. Bilateral ureteral stents are in place. The left renal collecting system is decompressed. There is right-sided  pelvocaliectasis and perinephric fat stranding. Several small stones are identified within the right renal collecting system. The largest is in the inferior pole measuring 2-3 mm. The patient is status post cystectomy. Bladder reconstruction has been performed. There is a suprapubic catheter within the neobladder as well as a Foley catheter. The ureteral stents exit along the course of the supra pubic catheter. Gas is identified within the neobladder. This is a nonspecific finding and may be related instrumentation. Alternatively this could reflect the presence of anaerobic infection.  The abdominal aorta has a normal caliber. There is no upper abdominal adenopathy identified. Pelvic lymph node dissection has been performed bilaterally. No pelvic or inguinal adenopathy identified.  The stomach appears normal. The small bowel loops have a normal course and caliber and there is no evidence for bowel obstruction. The appendix is visualized and appears normal. Normal appearance of the colon.  Review of the visualized bony structures is significant for multi level degenerative disc disease within the lumbar spine. Bilateral L4 pars defects are identified. There is an anterolisthesis of L4 on L5. Compression fracture involving the T11 vertebra is again noted.  IMPRESSION: 1. Status post cystectomy with neobladder formation. 2. Bilateral ureteral stents are in place. There is right-sided pelvocaliectasis and perinephric fat stranding which may reflect mild obstructive uropathy or pyelonephritis. Gas is noted within the neobladder which is nonspecific but may be be indicative of an anaerobic infection. 3. Similar appearance of T11 compression fracture.   Electronically Signed   By: Kerby Moors M.D.   On: 05/09/2013 19:08   Dg Chest 2 View  05/09/2013   CLINICAL DATA:  Tachycardia, shortness of breath, history diabetes, bladder cancer  EXAM: CHEST  2 VIEW  COMPARISON:  10/21/2012  FINDINGS: Upper normal heart size.   Mediastinal contours and pulmonary vascularity normal.  Mild right basilar atelectasis.  Lungs otherwise clear.  No pleural effusion or pneumothorax.  Chronic compression deformity of a vertebra at the thoracolumbar junction, approximately T11, question minimally increased.  Bones otherwise unremarkable  IMPRESSION: Mild right basilar atelectasis.  Slightly increased height loss of a vertebra at the thoracolumbar junction, question T11.   Electronically Signed   By: Lavonia Dana M.D.   On: 05/09/2013 14:54   Nm Pulmonary Perf And Vent  05/09/2013   CLINICAL DATA:  Shortness of breath and tachycardia  EXAM: NUCLEAR MEDICINE VENTILATION - PERFUSION LUNG SCAN  Views: Anterior, posterior, left lateral, right lateral,  RPO, LPO, RAO, LAO -ventilation and perfusion  Radionuclide: Technetium 52m DTPA -ventilation; Technetium 21m macroaggregated albumin-perfusion  Dose:  40.0 mCi-ventilation; 6.0 mCi- perfusion  Route of administration: Inhalation-ventilation; intravenous- perfusion  COMPARISON:  Chest radiograph May 09, 2013  FINDINGS: Radiotracer uptake on the ventilation study is homogeneous and symmetric bilaterally.  Radiotracer uptake on the perfusion study is homogeneous and symmetric bilaterally.  There is no appreciable ventilation/perfusion mismatch.  IMPRESSION: No ventilation or perfusion abnormalities are appreciated. Very low probability of pulmonary embolus.   Electronically Signed   By: Lowella Grip M.D.   On: 05/09/2013 16:30       Disposition and Follow-up:     Discharge Orders   Future Orders Complete By Expires   Diet Carb Modified  As directed    Discharge instructions  As directed    Comments:     Please HOLD nitrofurantoin while you are taking levaquin antibiotic. You can resume nitrofurantoin per your urologist's advice.   Please note you have been diagnosed with diabetes, Hb A1C is 7.5. You can start Amaryl 2mg  daily with breakfast and discuss with your primary physician  for further diabetes management.   Increase activity slowly  As directed        DISPOSITION: Home  DIET: Carb modified diet   TESTS THAT NEED FOLLOW-UP Please follow blood cultures, obrain CBC, BMET at the followup  DISCHARGE FOLLOW-UP Follow-up Information   Follow up with Florina Ou, MD. Schedule an appointment as soon as possible for a visit in 2 weeks. (hospital follow-up and discuss about diabetes)    Specialty:  Family Medicine   Contact information:   [address] [place] [address] [place] [place] [address] [phone]       Follow up with Molli Hazard, MD. Schedule an appointment as soon as possible for a visit in 2 weeks. (for hospital follow-up )    Specialty:  Urology   Contact information:   [facility]  [place] [address] [phone]       Time spent on Discharge: 40 minutes  Signed:   RAI,RIPUDEEP M.D. [facility] 05/13/2013, 10:56 AM Pager: [phone]

## 2013-05-13 NOTE — Plan of Care (Signed)
Problem: Phase I Progression Outcomes Goal: Voiding-avoid urinary catheter unless indicated Outcome: Not Applicable Date Met:  40/08/67 Patient will be discharged with his foley.

## 2013-05-18 LAB — CULTURE, BLOOD (ROUTINE X 2)
Culture: NO GROWTH
Culture: NO GROWTH

## 2013-05-21 ENCOUNTER — Encounter (HOSPITAL_COMMUNITY): Payer: Self-pay | Admitting: Emergency Medicine

## 2013-05-21 ENCOUNTER — Inpatient Hospital Stay (HOSPITAL_COMMUNITY): Payer: BC Managed Care – PPO

## 2013-05-21 ENCOUNTER — Inpatient Hospital Stay (HOSPITAL_COMMUNITY)
Admission: EM | Admit: 2013-05-21 | Discharge: 2013-05-25 | DRG: 683 | Disposition: A | Discharge status: Home / Self Care | Payer: BC Managed Care – PPO | Attending: Internal Medicine | Admitting: Internal Medicine

## 2013-05-21 DIAGNOSIS — I498 Other specified cardiac arrhythmias: Secondary | ICD-10-CM | POA: Diagnosis present

## 2013-05-21 DIAGNOSIS — E860 Dehydration: Secondary | ICD-10-CM

## 2013-05-21 DIAGNOSIS — E872 Acidosis, unspecified: Secondary | ICD-10-CM | POA: Diagnosis present

## 2013-05-21 DIAGNOSIS — N189 Chronic kidney disease, unspecified: Secondary | ICD-10-CM | POA: Diagnosis present

## 2013-05-21 DIAGNOSIS — D72829 Elevated white blood cell count, unspecified: Secondary | ICD-10-CM

## 2013-05-21 DIAGNOSIS — K219 Gastro-esophageal reflux disease without esophagitis: Secondary | ICD-10-CM | POA: Diagnosis present

## 2013-05-21 DIAGNOSIS — M109 Gout, unspecified: Secondary | ICD-10-CM | POA: Diagnosis present

## 2013-05-21 DIAGNOSIS — N179 Acute kidney failure, unspecified: Secondary | ICD-10-CM

## 2013-05-21 DIAGNOSIS — Z79899 Other long term (current) drug therapy: Secondary | ICD-10-CM

## 2013-05-21 DIAGNOSIS — N19 Unspecified kidney failure: Secondary | ICD-10-CM

## 2013-05-21 DIAGNOSIS — F329 Major depressive disorder, single episode, unspecified: Secondary | ICD-10-CM | POA: Diagnosis present

## 2013-05-21 DIAGNOSIS — Z906 Acquired absence of other parts of urinary tract: Secondary | ICD-10-CM

## 2013-05-21 DIAGNOSIS — R000 Tachycardia, unspecified: Secondary | ICD-10-CM

## 2013-05-21 DIAGNOSIS — Z8551 Personal history of malignant neoplasm of bladder: Secondary | ICD-10-CM

## 2013-05-21 DIAGNOSIS — I10 Essential (primary) hypertension: Secondary | ICD-10-CM | POA: Diagnosis present

## 2013-05-21 DIAGNOSIS — E875 Hyperkalemia: Secondary | ICD-10-CM | POA: Diagnosis present

## 2013-05-21 DIAGNOSIS — C679 Malignant neoplasm of bladder, unspecified: Secondary | ICD-10-CM

## 2013-05-21 DIAGNOSIS — I129 Hypertensive chronic kidney disease with stage 1 through stage 4 chronic kidney disease, or unspecified chronic kidney disease: Secondary | ICD-10-CM | POA: Diagnosis present

## 2013-05-21 DIAGNOSIS — F411 Generalized anxiety disorder: Secondary | ICD-10-CM | POA: Diagnosis present

## 2013-05-21 DIAGNOSIS — E119 Type 2 diabetes mellitus without complications: Secondary | ICD-10-CM | POA: Diagnosis present

## 2013-05-21 DIAGNOSIS — Z87891 Personal history of nicotine dependence: Secondary | ICD-10-CM

## 2013-05-21 DIAGNOSIS — R6510 Systemic inflammatory response syndrome (SIRS) of non-infectious origin without acute organ dysfunction: Secondary | ICD-10-CM | POA: Diagnosis present

## 2013-05-21 DIAGNOSIS — F3289 Other specified depressive episodes: Secondary | ICD-10-CM | POA: Diagnosis present

## 2013-05-21 LAB — POCT I-STAT, CHEM 8
BUN: 115 mg/dL — AB (ref 6–23)
CHLORIDE: 110 meq/L (ref 96–112)
Calcium, Ion: 1.66 mmol/L — ABNORMAL HIGH (ref 1.12–1.23)
Creatinine, Ser: 3.1 mg/dL — ABNORMAL HIGH (ref 0.50–1.35)
Glucose, Bld: 101 mg/dL — ABNORMAL HIGH (ref 70–99)
HCT: 37 % — ABNORMAL LOW (ref 39.0–52.0)
Hemoglobin: 12.6 g/dL — ABNORMAL LOW (ref 13.0–17.0)
Potassium: 5.9 mEq/L — ABNORMAL HIGH (ref 3.7–5.3)
SODIUM: 132 meq/L — AB (ref 137–147)
TCO2: 13 mmol/L (ref 0–100)

## 2013-05-21 LAB — BASIC METABOLIC PANEL
BUN: 90 mg/dL — ABNORMAL HIGH (ref 6–23)
CHLORIDE: 100 meq/L (ref 96–112)
CO2: 11 meq/L — AB (ref 19–32)
Calcium: 11.4 mg/dL — ABNORMAL HIGH (ref 8.4–10.5)
Creatinine, Ser: 2.86 mg/dL — ABNORMAL HIGH (ref 0.50–1.35)
GFR calc Af Amer: 27 mL/min — ABNORMAL LOW (ref >90)
GFR calc non Af Amer: 23 mL/min — ABNORMAL LOW (ref >90)
Glucose, Bld: 102 mg/dL — ABNORMAL HIGH (ref 70–99)
Potassium: 6 mEq/L — ABNORMAL HIGH (ref 3.7–5.3)
Sodium: 128 mEq/L — ABNORMAL LOW (ref 137–147)

## 2013-05-21 LAB — URINE MICROSCOPIC-ADD ON

## 2013-05-21 LAB — URINALYSIS, ROUTINE W REFLEX MICROSCOPIC
BILIRUBIN URINE: NEGATIVE
Glucose, UA: NEGATIVE mg/dL
Ketones, ur: NEGATIVE mg/dL
Nitrite: NEGATIVE
PH: 7 (ref 5.0–8.0)
Protein, ur: 100 mg/dL — AB
SPECIFIC GRAVITY, URINE: 1.013 (ref 1.005–1.030)
Urobilinogen, UA: 0.2 mg/dL (ref 0.0–1.0)

## 2013-05-21 LAB — GLUCOSE, CAPILLARY
Glucose-Capillary: 70 mg/dL (ref 70–99)
Glucose-Capillary: 113 mg/dL — ABNORMAL HIGH (ref 70–99)

## 2013-05-21 LAB — CBC
HCT: 32.2 % — ABNORMAL LOW (ref 39.0–52.0)
Hemoglobin: 11.3 g/dL — ABNORMAL LOW (ref 13.0–17.0)
MCH: 29.4 pg (ref 26.0–34.0)
MCHC: 35.1 g/dL (ref 30.0–36.0)
MCV: 83.9 fL (ref 78.0–100.0)
PLATELETS: 542 10*3/uL — AB (ref 150–400)
RBC: 3.84 MIL/uL — AB (ref 4.22–5.81)
RDW: 13.5 % (ref 11.5–15.5)
WBC: 21.5 10*3/uL — AB (ref 4.0–10.5)

## 2013-05-21 LAB — MRSA PCR SCREENING: MRSA by PCR: NEGATIVE

## 2013-05-21 LAB — POCT I-STAT TROPONIN I: TROPONIN I, POC: 0 ng/mL (ref 0.00–0.08)

## 2013-05-21 LAB — CG4 I-STAT (LACTIC ACID): Lactic Acid, Venous: 1.18 mmol/L (ref 0.5–2.2)

## 2013-05-21 MED ORDER — SODIUM BICARBONATE 8.4 % IV SOLN
50.0000 meq | Freq: Once | INTRAVENOUS | Status: AC
  Administered 2013-05-21: 50 meq via INTRAVENOUS
  Filled 2013-05-21: qty 50

## 2013-05-21 MED ORDER — SODIUM CHLORIDE 0.9 % IV SOLN: INTRAVENOUS | Status: DC

## 2013-05-21 MED ORDER — INSULIN ASPART 100 UNIT/ML ~~LOC~~ SOLN
0.0000 [IU] | Freq: Three times a day (TID) | SUBCUTANEOUS | Status: DC
  Administered 2013-05-22: 2 [IU] via SUBCUTANEOUS
  Administered 2013-05-23: 3 [IU] via SUBCUTANEOUS
  Administered 2013-05-23: 1 [IU] via SUBCUTANEOUS
  Administered 2013-05-23: 2 [IU] via SUBCUTANEOUS
  Administered 2013-05-24: 5 [IU] via SUBCUTANEOUS
  Administered 2013-05-24 – 2013-05-25 (×2): 1 [IU] via SUBCUTANEOUS

## 2013-05-21 MED ORDER — PIPERACILLIN-TAZOBACTAM 3.375 G IVPB
3.3750 g | Freq: Three times a day (TID) | INTRAVENOUS | Status: DC
  Administered 2013-05-22 – 2013-05-23 (×5): 3.375 g via INTRAVENOUS
  Filled 2013-05-21 (×6): qty 50

## 2013-05-21 MED ORDER — INSULIN ASPART 100 UNIT/ML IV SOLN
10.0000 [IU] | Freq: Once | INTRAVENOUS | Status: DC
  Filled 2013-05-21: qty 0.1

## 2013-05-21 MED ORDER — PIPERACILLIN-TAZOBACTAM 3.375 G IVPB 30 MIN
3.3750 g | Freq: Once | INTRAVENOUS | Status: AC
  Administered 2013-05-21: 3.375 g via INTRAVENOUS
  Filled 2013-05-21: qty 50

## 2013-05-21 MED ORDER — CITALOPRAM HYDROBROMIDE 40 MG PO TABS
40.0000 mg | ORAL_TABLET | Freq: Every day | ORAL | Status: DC
  Administered 2013-05-22 – 2013-05-25 (×4): 40 mg via ORAL
  Filled 2013-05-21 (×5): qty 1

## 2013-05-21 MED ORDER — OXYCODONE-ACETAMINOPHEN 5-325 MG PO TABS
1.0000 | ORAL_TABLET | Freq: Four times a day (QID) | ORAL | Status: DC | PRN
  Administered 2013-05-22 – 2013-05-25 (×4): 1 via ORAL
  Filled 2013-05-21 (×4): qty 1

## 2013-05-21 MED ORDER — DEXTROSE 50 % IV SOLN
50.0000 mL | Freq: Once | INTRAVENOUS | Status: AC
  Administered 2013-05-21: 50 mL via INTRAVENOUS
  Filled 2013-05-21: qty 50

## 2013-05-21 MED ORDER — FENTANYL CITRATE 0.05 MG/ML IJ SOLN
100.0000 ug | Freq: Once | INTRAMUSCULAR | Status: AC
  Administered 2013-05-21: 100 ug via INTRAVENOUS
  Filled 2013-05-21: qty 2

## 2013-05-21 MED ORDER — INSULIN ASPART 100 UNIT/ML ~~LOC~~ SOLN
10.0000 [IU] | Freq: Once | SUBCUTANEOUS | Status: AC
  Administered 2013-05-21: 10 [IU] via INTRAVENOUS
  Filled 2013-05-21: qty 1

## 2013-05-21 MED ORDER — ONDANSETRON HCL 4 MG/2ML IJ SOLN: 4.0000 mg | Freq: Four times a day (QID) | INTRAMUSCULAR | Status: DC | PRN

## 2013-05-21 MED ORDER — ENOXAPARIN SODIUM 30 MG/0.3ML ~~LOC~~ SOLN
30.0000 mg | SUBCUTANEOUS | Status: DC
  Administered 2013-05-21: 30 mg via SUBCUTANEOUS
  Filled 2013-05-21 (×2): qty 0.3

## 2013-05-21 MED ORDER — SODIUM BICARBONATE 8.4 % IV SOLN
INTRAVENOUS | Status: DC
  Administered 2013-05-21 – 2013-05-23 (×4): via INTRAVENOUS
  Filled 2013-05-21 (×6): qty 1000

## 2013-05-21 MED ORDER — FAMOTIDINE 20 MG PO TABS
20.0000 mg | ORAL_TABLET | Freq: Every day | ORAL | Status: DC
  Administered 2013-05-22 – 2013-05-25 (×4): 20 mg via ORAL
  Filled 2013-05-21 (×5): qty 1

## 2013-05-21 MED ORDER — SODIUM CHLORIDE 0.9 % IV BOLUS (SEPSIS)
1000.0000 mL | Freq: Once | INTRAVENOUS | Status: AC
  Administered 2013-05-21: 1000 mL via INTRAVENOUS

## 2013-05-21 MED ORDER — ACETAMINOPHEN 325 MG PO TABS: 650.0000 mg | ORAL_TABLET | Freq: Four times a day (QID) | ORAL | Status: DC | PRN

## 2013-05-21 MED ORDER — SODIUM POLYSTYRENE SULFONATE 15 GM/60ML PO SUSP
15.0000 g | Freq: Once | ORAL | Status: AC
  Administered 2013-05-21: 15 g via ORAL
  Filled 2013-05-21: qty 60

## 2013-05-21 MED ORDER — SODIUM CHLORIDE 0.9 % IV BOLUS (SEPSIS)
2000.0000 mL | Freq: Once | INTRAVENOUS | Status: AC
  Administered 2013-05-21: 2000 mL via INTRAVENOUS

## 2013-05-21 NOTE — Progress Notes (Signed)
ANTIBIOTIC CONSULT NOTE - INITIAL  Pharmacy Consult for zosyn Indication: r/o pyelo  No Known Allergies  Patient Measurements: Height: 5\' 10"  (177.8 cm) Weight: 197 lb (89.359 kg) IBW/kg (Calculated) : 73 Adjusted Body Weight:   Vital Signs: Temp: 98.4 F (36.9 C) (02/16 1525) BP: 115/58 mmHg (02/16 1700) Pulse Rate: 95 (02/16 1700) Intake/Output from previous day:   Intake/Output from this shift:    Labs:  Recent Labs  05/21/13 1527 05/21/13 1555  WBC 21.5*  --   HGB 11.3* 12.6*  PLT 542*  --   CREATININE 2.86* 3.10*   Estimated Creatinine Clearance: 30.7 ml/min (by C-G formula based on Cr of 3.1). No results found for this basename: VANCOTROUGH, VANCOPEAK, VANCORANDOM, [place], GENTPEAK, GENTRANDOM, TOBRATROUGH, TOBRAPEAK, TOBRARND, AMIKACINPEAK, AMIKACINTROU, AMIKACIN,  in the last 72 hours   Microbiology: Recent Results (from the past 720 hour(s))  CULTURE, BLOOD (ROUTINE X 2)     Status: None   Collection Time    05/09/13  3:00 PM      Result Value Ref Range Status   Specimen Description BLOOD ARM RIGHT   Final   Special Requests BOTTLES DRAWN AEROBIC AND ANAEROBIC 10CC   Final   Culture  Setup Time     Final   Value: 05/09/2013 22:14     Performed at [facility]   Culture     Final   Value: KLEBSIELLA PNEUMONIAE     Note: Gram Stain Report Called to,Read Back By and Verified With: Esmond Camper @ [phone] 05/10/13 BY KRAWS     Performed at [facility]   Report Status 05/12/2013 FINAL   Final   Organism ID, Bacteria KLEBSIELLA PNEUMONIAE   Final  URINE CULTURE     Status: None   Collection Time    05/09/13  3:44 PM      Result Value Ref Range Status   Specimen Description URINE, SUPRAPUBIC   Final   Special Requests NONE   Final   Culture  Setup Time     Final   Value: 05/09/2013 22:25     Performed at [facility]     Final   Value: >=100,000 COLONIES/ML     Performed at [facility]   Culture      Final   Value: CITROBACTER [place]     Performed at [facility]   Report Status 05/12/2013 FINAL   Final   Organism ID, Bacteria CITROBACTER FREUNDII   Final   Organism ID, Bacteria KLEBSIELLA PNEUMONIAE   Final  URINE CULTURE     Status: None   Collection Time    05/09/13  4:40 PM      Result Value Ref Range Status   Specimen Description URINE, CATHETERIZED   Final   Special Requests NONE   Final   Culture  Setup Time     Final   Value: 05/09/2013 22:20     Performed at [place]     Final   Value: >=100,000 COLONIES/ML     Performed at [facility]   Culture     Final   Value: CITROBACTER FREUNDII     Performed at [facility]   Report Status 05/13/2013 FINAL   Final   Organism ID, Bacteria CITROBACTER FREUNDII   Final  CULTURE, BLOOD (ROUTINE X 2)     Status: None   Collection Time  05/09/13  6:10 PM      Result Value Ref Range Status   Specimen Description BLOOD ARM RIGHT   Final   Special Requests BOTTLES DRAWN AEROBIC AND ANAEROBIC 10CC   Final   Culture  Setup Time     Final   Value: 05/10/2013 00:26     Performed at [facility]   Culture     Final   Value: KLEBSIELLA PNEUMONIAE     Note: SUSCEPTIBILITIES PERFORMED ON PREVIOUS CULTURE WITHIN THE LAST 5 DAYS.     Note: Gram Stain Report Called to,Read Back By and Verified With: Esmond Camper @ [phone] 05/10/13 BY KRAWS     Performed at [facility]   Report Status 05/12/2013 FINAL   Final  CULTURE, BLOOD (ROUTINE X 2)     Status: None   Collection Time    05/12/13 12:00 PM      Result Value Ref Range Status   Specimen Description BLOOD RIGHT ARM   Final   Special Requests BOTTLES DRAWN AEROBIC AND ANAEROBIC 10CC EACH   Final   Culture  Setup Time     Final   Value: 05/12/2013 18:30     Performed at [facility]   Culture     Final   Value: NO GROWTH 5 DAYS     Performed at [facility]   Report Status  05/18/2013 FINAL   Final  CULTURE, BLOOD (ROUTINE X 2)     Status: None   Collection Time    05/12/13 12:15 PM      Result Value Ref Range Status   Specimen Description BLOOD LEFT HAND   Final   Special Requests BOTTLES DRAWN AEROBIC AND ANAEROBIC 10CC EACH   Final   Culture  Setup Time     Final   Value: 05/12/2013 18:30     Performed at [facility]   Culture     Final   Value: NO GROWTH 5 DAYS     Performed at [facility]   Report Status 05/18/2013 FINAL   Final    Medical History: Past Medical History  Diagnosis Date  . Gout     recent flair -bilateral feet--  STABLE  . Frequency of urination   . Urgency incontinence   . Nocturia   . Diabetes mellitus type 2, diet-controlled PT STOPPED TAKING METFORMIN--  HAS BEEN WATCHING DIET, EXERCISING AND LOSING WT  . Bladder cancer 2013  . Complication of anesthesia     agitation w/awakening in 9/13,OK 01/24/12  . Hypertension   . Anxiety   . Depression   . GERD (gastroesophageal reflux disease)     Medications:  Anti-infectives   Start     Dose/Rate Route Frequency Ordered Stop   05/21/13 1845  piperacillin-tazobactam (ZOSYN) IVPB 3.375 g     3.375 g 100 mL/hr over 30 Minutes Intravenous  Once 05/21/13 1830       Assessment: 55 yom presented to the ED with elevated potassium. To start empiric zosyn for possible pyelonephritis. Pt has a recent history of sepsis/bacteremia d/t klebsiella + citrobacter UTI. Pt is afebrile but WBC is 21.5. Scr is elevated at 3.1.   Goal of Therapy:  Eradication of infection  Plan:  1. Zosyn 3.375gm IV Q8H (4 hr inf) 2. F/u renal fxn, C&S, clinical status   Kalvyn Desa, Rande Lawman 05/21/2013,6:34 PM

## 2013-05-21 NOTE — ED Notes (Signed)
Pt states he was sent here by his MD for elevated potassium.  Recently had bladder reconstruction to remove bladder CA and create a new bladder for him using his own intestines.  Denies pain, just states he is very weak.

## 2013-05-21 NOTE — ED Provider Notes (Addendum)
CSN: [id number]     Arrival date & time 05/21/13  1504 History   First MD Initiated Contact with Patient 05/21/13 1700     Chief Complaint  Patient presents with  . abnormal labs      (Consider location/radiation/quality/duration/timing/severity/associated sxs/prior Treatment) HPI This 53 year old male is sent to the emergency department for admission for hyperkalemia, he is a history of bladder cancer with bladder resection about a month ago at [place], about 2 weeks postoperatively he was hospitalized for sepsis with pyelonephritis and renal failure, his renal function was improving and today routine clinic followup he had labs checked revealing worsening renal function and hyperkalemia and was sent back to the emergency department, he has been generally weak since his last discharge has constant stable slowly improving abdominal pain since his surgery, he has no fever no altered mental status no chest pain no shortness of breath no new or worsening abdominal pain, he is no nausea today but did have one episode of vomiting this morning when brushing his teeth but no other vomiting besides that, he is no treatment prior to arrival and other concerns. last bowel movement was today and last urination about 1500 today. Past Medical History  Diagnosis Date  . Gout     recent flair -bilateral feet--  STABLE  . Frequency of urination   . Urgency incontinence   . Nocturia   . Diabetes mellitus type 2, diet-controlled PT STOPPED TAKING METFORMIN--  HAS BEEN WATCHING DIET, EXERCISING AND LOSING WT  . Bladder cancer 2013  . Complication of anesthesia     agitation w/awakening in 9/13,OK 01/24/12  . Hypertension   . Anxiety   . Depression   . GERD (gastroesophageal reflux disease)    Past Surgical History  Procedure Laterality Date  . Ulner artery repair  2009    rt -trauma /thrombectomy,repair  . Transurethral resection of bladder tumor  12/15/2011    Procedure:  TRANSURETHRAL RESECTION OF BLADDER TUMOR (TURBT);  Surgeon: Molli Hazard, MD;  Location: [facility];  Service: Urology;  Laterality: N/A;  2 HRS Procedure reads: cysto, TURBT , Bilateral retrograde pyelograms, poss bilateral ureteroscopy with biopsy and ureter stent placement   . Lumbar laminectomy  06-02-2005    W/ RESECTION NERVE ROOT  L4  -  L5  . Transurethral resection of bladder tumor  01/24/2012    Procedure: TRANSURETHRAL RESECTION OF BLADDER TUMOR (TURBT);  Surgeon: Molli Hazard, MD;  Location: [facility];  Service: Urology;  Laterality: N/A;  90 MIN ADD LEFT URETER BIOPSY TO PROCEDURE    . Cystoscopy with urethral dilatation  01/24/2012    Procedure: CYSTOSCOPY WITH URETHRAL DILATATION;  Surgeon: Molli Hazard, MD;  Location: [facility];  Service: Urology;  Laterality: N/A;  . Transurethral resection of bladder tumor N/A 07/10/2012    Procedure: TRANSURETHRAL RESECTION OF BLADDER TUMOR (TURBT);  Surgeon: Molli Hazard, MD;  Location: [facility];  Service: Urology;  Laterality: N/A;  . Cystoscopy w/ retrogrades Bilateral 07/10/2012    Procedure: CYSTOSCOPY WITH RETROGRADE PYELOGRAM;  Surgeon: Molli Hazard, MD;  Location: [facility];  Service: Urology;  Laterality: Bilateral;   Family History  Problem Relation Age of Onset  . Diabetes Mellitus II Other   . Hypertension Other   . Bladder Cancer Neg Hx    History  Substance Use Topics  . Smoking status: Former Smoker -- 1.00 packs/day for 16 years  Types: Cigarettes    Quit date: 01/20/1992  . Smokeless tobacco: Former Systems developer    Types: [place] date: 01/20/1992  . Alcohol Use: No    Review of Systems 10 Systems reviewed and are negative for acute change except as noted in the HPI.   Allergies  Review of patient's allergies indicates no known allergies.  Home Medications   No current outpatient  prescriptions on file. BP 125/61  Pulse 105  Temp(Src) 98.3 F (36.8 C) (Oral)  Resp 19  Ht 5\' 10"  (1.778 m)  Wt 197 lb 1.5 oz (89.4 kg)  BMI 28.28 kg/m2  SpO2 94% Physical Exam  Nursing note and vitals reviewed. Constitutional:  Awake, alert, nontoxic appearance.  HENT:  Head: Atraumatic.  Eyes: Right eye exhibits no discharge. Left eye exhibits no discharge.  Neck: Neck supple.  Cardiovascular: Normal rate and regular rhythm.   No murmur heard. Pulmonary/Chest: Effort normal and breath sounds normal. No respiratory distress. He has no wheezes. He has no rales. He exhibits no tenderness.  Abdominal: Soft. Bowel sounds are normal. He exhibits no distension and no mass. There is tenderness. There is no rebound and no guarding.  Mild diffuse abdominal tenderness without rebound his abdominal incision is healing well with no erythema fluctuance or dehiscence  Musculoskeletal: He exhibits no edema and no tenderness.  Baseline ROM, no obvious new focal weakness.  Neurological: He is alert.  Mental status and motor strength appears baseline for patient and situation.  Skin: No rash noted.  Psychiatric: He has a normal mood and affect.    ED Course  Procedures (including critical care time) Patient / Family / Caregiver informed of clinical course, understand medical decision-making process, and agree with plan.Pt stable in ED with no significant deterioration in condition.d/w Med for admit.  ECG Interpretation available in Muse (interface with Epic not working).  Labs Review Labs Reviewed  CBC - Abnormal; Notable for the following:    WBC 21.5 (*)    RBC 3.84 (*)    Hemoglobin 11.3 (*)    HCT 32.2 (*)    Platelets 542 (*)    All other components within normal limits  BASIC METABOLIC PANEL - Abnormal; Notable for the following:    Sodium 128 (*)    Potassium 6.0 (*)    CO2 11 (*)    Glucose, Bld 102 (*)    BUN 90 (*)    Creatinine, Ser 2.86 (*)    Calcium 11.4 (*)    GFR  calc non Af Amer 23 (*)    GFR calc Af Amer 27 (*)    All other components within normal limits  URINALYSIS, ROUTINE W REFLEX MICROSCOPIC - Abnormal; Notable for the following:    APPearance CLOUDY (*)    Hgb urine dipstick LARGE (*)    Protein, ur 100 (*)    Leukocytes, UA LARGE (*)    All other components within normal limits  URINE MICROSCOPIC-ADD ON - Abnormal; Notable for the following:    Squamous Epithelial / LPF FEW (*)    Bacteria, UA FEW (*)    All other components within normal limits  CBC - Abnormal; Notable for the following:    WBC 13.1 (*)    RBC 2.90 (*)    Hemoglobin 8.6 (*)    HCT 24.4 (*)    All other components within normal limits  BASIC METABOLIC PANEL - Abnormal; Notable for the following:    Sodium 133 (*)  CO2 12 (*)    BUN 81 (*)    Creatinine, Ser 2.53 (*)    GFR calc non Af Amer 27 (*)    GFR calc Af Amer 31 (*)    All other components within normal limits  GLUCOSE, CAPILLARY - Abnormal; Notable for the following:    Glucose-Capillary 113 (*)    All other components within normal limits  GLUCOSE, CAPILLARY - Abnormal; Notable for the following:    Glucose-Capillary 118 (*)    All other components within normal limits  BASIC METABOLIC PANEL - Abnormal; Notable for the following:    Sodium 136 (*)    Glucose, Bld 110 (*)    BUN 65 (*)    Creatinine, Ser 2.18 (*)    GFR calc non Af Amer 33 (*)    GFR calc Af Amer 38 (*)    All other components within normal limits  CBC - Abnormal; Notable for the following:    WBC 12.6 (*)    RBC 2.88 (*)    Hemoglobin 8.5 (*)    HCT 24.0 (*)    All other components within normal limits  GLUCOSE, CAPILLARY - Abnormal; Notable for the following:    Glucose-Capillary 165 (*)    All other components within normal limits  GLUCOSE, CAPILLARY - Abnormal; Notable for the following:    Glucose-Capillary 268 (*)    All other components within normal limits  GLUCOSE, CAPILLARY - Abnormal; Notable for the  following:    Glucose-Capillary 151 (*)    All other components within normal limits  GLUCOSE, CAPILLARY - Abnormal; Notable for the following:    Glucose-Capillary 146 (*)    All other components within normal limits  D-DIMER, QUANTITATIVE - Abnormal; Notable for the following:    D-Dimer, Quant 3.38 (*)    All other components within normal limits  GLUCOSE, CAPILLARY - Abnormal; Notable for the following:    Glucose-Capillary 232 (*)    All other components within normal limits  POCT I-STAT, CHEM 8 - Abnormal; Notable for the following:    Sodium 132 (*)    Potassium 5.9 (*)    BUN 115 (*)    Creatinine, Ser 3.10 (*)    Glucose, Bld 101 (*)    Calcium, Ion 1.66 (*)    Hemoglobin 12.6 (*)    HCT 37.0 (*)    All other components within normal limits  CULTURE, BLOOD (ROUTINE X 2)  CULTURE, BLOOD (ROUTINE X 2)  URINE CULTURE  MRSA PCR SCREENING  GLUCOSE, CAPILLARY  GLUCOSE, CAPILLARY  BASIC METABOLIC PANEL  CBC  POCT I-STAT TROPONIN I  CG4 I-STAT (LACTIC ACID)   Imaging Review No results found.    MDM   Final diagnoses:  Hyperkalemia  Renal failure  Dehydration  Bladder cancer    The patient appears reasonably stabilized for admission considering the current resources, flow, and capabilities available in the ED at this time, and I doubt any other [facility] requiring further screening and/or treatment in the ED prior to admission.    Babette Relic, MD 05/23/13 [id number]  Babette Relic, MD 05/23/13 [phone]

## 2013-05-21 NOTE — H&P (Addendum)
[facility] History and Physical  [name] [name] [id number] DOB: 04/11/1960 DOA: 05/21/2013  Referring physician: EDP PCP: Florina Ou, MD  Urology Dr.Dahlstedt and Dr.Scott Shelfo in [facility] treatment [facility] [place]  Chief Complaint: EDP  HPI: [name] [name] is a 53 y.o. male with PMH of DM, HTN, history of bladder cancer of the currently under treatment the little at the cancer treatment centers primary care in [place].  On January 22 he had cystectomy, neobladder creation surgery, placement of a suprapubic catheter and stent in left ureter. Following this he was admitted on 2/4 through 2/8 with sepsis secondary to Klebsiella pyelonephritis. He also had acute renal failure and metabolic acidosis which improved, he was discharged home on Po Levaquin. He subsequently went back to the cancer treatments centers of [place] and had urethral cathter removed and stent in L ureter removed, subsequently his kidney function  bumped up some and was given fluids and watched an extra day and discharged home with creatinine in 2s and advised to have blood work Monday. He had blood work done today through [facility] office and was sent to ER due to bump in creatinine to 3.1 and K of 6.0. Pt only reports ongoing weakness. No fevers or chills No changes in his chronic symptoms of some abdominal pain. Denies NSAID use  Review of Systems:  Constitutional:  No weight loss, night sweats, Fevers, chills, fatigue.  HEENT:  No headaches, Difficulty swallowing,Tooth/dental problems,Sore throat,  No sneezing, itching, ear ache, nasal congestion, post nasal drip,  Cardio-vascular:  No chest pain, Orthopnea, PND, swelling in lower extremities, anasarca, dizziness, palpitations  GI:  No heartburn, indigestion, abdominal pain, nausea, vomiting, diarrhea, change in bowel habits, loss of appetite  Resp:  No shortness of breath with exertion or at rest. No excess mucus, no productive cough, No non-productive  cough, No coughing up of blood.No change in color of mucus.No wheezing.No chest wall deformity  Skin:  no rash or lesions.  GU:  no dysuria, change in color of urine, no urgency or frequency. No flank pain.  Musculoskeletal:  No joint pain or swelling. No decreased range of motion. No back pain.  Psych:  No change in mood or affect. No depression or anxiety. No memory loss.   Past Medical History  Diagnosis Date  . Gout     recent flair -bilateral feet--  STABLE  . Frequency of urination   . Urgency incontinence   . Nocturia   . Diabetes mellitus type 2, diet-controlled PT STOPPED TAKING METFORMIN--  HAS BEEN WATCHING DIET, EXERCISING AND LOSING WT  . Bladder cancer 2013  . Complication of anesthesia     agitation w/awakening in 9/13,OK 01/24/12  . Hypertension   . Anxiety   . Depression   . GERD (gastroesophageal reflux disease)    Past Surgical History  Procedure Laterality Date  . Ulner artery repair  2009    rt -trauma /thrombectomy,repair  . Transurethral resection of bladder tumor  12/15/2011    Procedure: TRANSURETHRAL RESECTION OF BLADDER TUMOR (TURBT);  Surgeon: Molli Hazard, MD;  Location: [facility];  Service: Urology;  Laterality: N/A;  2 HRS Procedure reads: cysto, TURBT , Bilateral retrograde pyelograms, poss bilateral ureteroscopy with biopsy and ureter stent placement   . Lumbar laminectomy  06-02-2005    W/ RESECTION NERVE ROOT  L4  -  L5  . Transurethral resection of bladder tumor  01/24/2012    Procedure: TRANSURETHRAL RESECTION OF BLADDER TUMOR (TURBT);  Surgeon: Quillian Quince  Julieanne Cotton, MD;  Location: [facility];  Service: Urology;  Laterality: N/A;  90 MIN ADD LEFT URETER BIOPSY TO PROCEDURE    . Cystoscopy with urethral dilatation  01/24/2012    Procedure: CYSTOSCOPY WITH URETHRAL DILATATION;  Surgeon: Molli Hazard, MD;  Location: [facility];  Service: Urology;  Laterality: N/A;  .  Transurethral resection of bladder tumor N/A 07/10/2012    Procedure: TRANSURETHRAL RESECTION OF BLADDER TUMOR (TURBT);  Surgeon: Molli Hazard, MD;  Location: [facility];  Service: Urology;  Laterality: N/A;  . Cystoscopy w/ retrogrades Bilateral 07/10/2012    Procedure: CYSTOSCOPY WITH RETROGRADE PYELOGRAM;  Surgeon: Molli Hazard, MD;  Location: [facility];  Service: Urology;  Laterality: Bilateral;   Social History:  reports that he quit smoking about 21 years ago. His smoking use included Cigarettes. He has a 16 pack-year smoking history. He quit smokeless tobacco use about 21 years ago. His smokeless tobacco use included Chew. He reports that he does not drink alcohol or use illicit drugs.  No Known Allergies  Family History  Problem Relation Age of Onset  . Diabetes Mellitus II Other   . Hypertension Other   . Bladder Cancer Neg Hx      Prior to Admission medications   Medication Sig Start Date End Date Taking? Authorizing Provider  amLODipine (NORVASC) 10 MG tablet Take 10 mg by mouth daily.   Yes Historical Provider, MD  citalopram (CELEXA) 40 MG tablet Take 40 mg by mouth daily.   Yes Historical Provider, MD  clotrimazole (LOTRIMIN) 1 % cream Apply 1 application topically 2 (two) times daily as needed (yeast infection).   Yes Historical Provider, MD  famotidine (PEPCID) 20 MG tablet Take 20 mg by mouth daily.   Yes Historical Provider, MD  glimepiride (AMARYL) 2 MG tablet Take 1 tablet (2 mg total) by mouth daily with breakfast. 05/13/13  Yes Ripudeep K Rai, MD  hydrALAZINE (APRESOLINE) 10 MG tablet Take 10 mg by mouth 3 (three) times daily.   Yes Historical Provider, MD  levofloxacin (LEVAQUIN) 750 MG tablet Take 1 tablet (750 mg total) by mouth daily. X 14 DAYS 05/13/13  Yes Ripudeep Krystal Eaton, MD  oxyCODONE-acetaminophen (PERCOCET/ROXICET) 5-325 MG per tablet Take 1 tablet by mouth every 6 (six) hours as needed for moderate pain or severe  pain.   Yes Historical Provider, MD  nitrofurantoin (MACRODANTIN) 100 MG capsule Take 1 capsule (100 mg total) by mouth daily. Please HOLD nitrofurantoin while you are on levaquin antibiotic 05/13/13   Ripudeep Krystal Eaton, MD   Physical Exam: Filed Vitals:   05/21/13 1700  BP: 115/58  Pulse: 95  Temp:   Resp: 18    BP 115/58  Pulse 95  Temp(Src) 98.4 F (36.9 C)  Resp 18  Ht 5\' 10"  (1.778 m)  Wt 89.359 kg (197 lb)  BMI 28.27 kg/m2  SpO2 100%  General:  Appears calm and comfortable, frail, ill appearing HEENT: PERRLA, EOMI, oral mucosa moist and pink Neck: no LAD, masses or thyromegaly Cardiovascular: RRR, no m/r/g. No LE edema. Respiratory: CTA bilaterally, no w/r/r. Normal respiratory effort. Abdomen: soft, Nt, ND, BS present L flank tenderness Skin: no edema c/c Musculoskeletal: grossly normal tone BUE/BLE Psychiatric: grossly normal mood and affect, speech fluent and appropriate Neurologic: grossly non-focal.          Labs on Admission:  Basic Metabolic Panel:  Recent Labs Lab 05/21/13 1527 05/21/13 1555  NA 128*  132*  K 6.0* 5.9*  CL 100 110  CO2 11*  --   GLUCOSE 102* 101*  BUN 90* 115*  CREATININE 2.86* 3.10*  CALCIUM 11.4*  --    Liver Function Tests: No results found for this basename: AST, ALT, ALKPHOS, BILITOT, PROT, ALBUMIN,  in the last 168 hours No results found for this basename: LIPASE, AMYLASE,  in the last 168 hours No results found for this basename: AMMONIA,  in the last 168 hours CBC:  Recent Labs Lab 05/21/13 1527 05/21/13 1555  WBC 21.5*  --   HGB 11.3* 12.6*  HCT 32.2* 37.0*  MCV 83.9  --   PLT 542*  --    Cardiac Enzymes: No results found for this basename: CKTOTAL, CKMB, CKMBINDEX, TROPONINI,  in the last 168 hours  BNP (last 3 results) No results found for this basename: PROBNP,  in the last 8760 hours CBG: No results found for this basename: GLUCAP,  in the last 168 hours  Radiological Exams on Admission: No results  found.  EKG: Independently reviewed. NSR, no acute ST T wave changes  Assessment/Plan  1. AKI on CKD -need to r/o obstruction given recent procedures -component of volume depletion too -check CT abd pelvis non contrast -d/w Dr.Wrenn, Urology will FU tomorrow -IVF -monitor urine output  2. Leukocytosis -afebrile, but concern for recurrent early sepsis related to urinary source -will check Urine, Blood Cx -check lactic acid -empiric Zosyn -FU Ct abd pelvis  3. Metabolic acidosis - due to 1 -baseline at discharge was around 18 -add bicarb to IVF  4. DM -hold oral hypoglycemics -SSI for now  5. HTN -hold antihypertensives, hydrate -monitor  DVT proph: lovenox  Code Status: Full Code Family Communication: d/w pt and spouse at bedside Disposition Plan: admit to SDU Time spent: 5min  Hazen Brumett [facility] Pager [phone]

## 2013-05-22 ENCOUNTER — Encounter (HOSPITAL_COMMUNITY): Payer: Self-pay | Admitting: *Deleted

## 2013-05-22 DIAGNOSIS — D72829 Elevated white blood cell count, unspecified: Secondary | ICD-10-CM

## 2013-05-22 LAB — BASIC METABOLIC PANEL
BUN: 81 mg/dL — AB (ref 6–23)
CO2: 12 mEq/L — ABNORMAL LOW (ref 19–32)
Calcium: 9.3 mg/dL (ref 8.4–10.5)
Chloride: 106 mEq/L (ref 96–112)
Creatinine, Ser: 2.53 mg/dL — ABNORMAL HIGH (ref 0.50–1.35)
GFR calc Af Amer: 31 mL/min — ABNORMAL LOW (ref >90)
GFR, EST NON AFRICAN AMERICAN: 27 mL/min — AB (ref >90)
GLUCOSE: 98 mg/dL (ref 70–99)
Potassium: 5 mEq/L (ref 3.7–5.3)
Sodium: 133 mEq/L — ABNORMAL LOW (ref 137–147)

## 2013-05-22 LAB — URINE CULTURE
COLONY COUNT: NO GROWTH
Culture: NO GROWTH

## 2013-05-22 LAB — GLUCOSE, CAPILLARY
GLUCOSE-CAPILLARY: 268 mg/dL — AB (ref 70–99)
Glucose-Capillary: 118 mg/dL — ABNORMAL HIGH (ref 70–99)
Glucose-Capillary: 80 mg/dL (ref 70–99)
Glucose-Capillary: 165 mg/dL — ABNORMAL HIGH (ref 70–99)

## 2013-05-22 LAB — CBC
HEMATOCRIT: 24.4 % — AB (ref 39.0–52.0)
Hemoglobin: 8.6 g/dL — ABNORMAL LOW (ref 13.0–17.0)
MCH: 29.7 pg (ref 26.0–34.0)
MCHC: 35.2 g/dL (ref 30.0–36.0)
MCV: 84.1 fL (ref 78.0–100.0)
Platelets: 343 10*3/uL (ref 150–400)
RBC: 2.9 MIL/uL — ABNORMAL LOW (ref 4.22–5.81)
RDW: 13.7 % (ref 11.5–15.5)
WBC: 13.1 10*3/uL — ABNORMAL HIGH (ref 4.0–10.5)

## 2013-05-22 MED ORDER — ENOXAPARIN SODIUM 40 MG/0.4ML ~~LOC~~ SOLN
40.0000 mg | SUBCUTANEOUS | Status: DC
  Administered 2013-05-22 – 2013-05-24 (×3): 40 mg via SUBCUTANEOUS
  Filled 2013-05-22 (×4): qty 0.4

## 2013-05-22 NOTE — Consult Note (Signed)
Subjective: I was asked to see [name] by Dr. Broadus  for urologic follow up.  He is a former patient of Dr Jasmine December who has a history refractory CIS that persisted despite multiple courses of BCG.  He had a cystectomy with neobladder on 1/22 at the [facility].  He had his right stent and foley removed last week.  He was admitted with sepsis on 2/4 - 2/8 with Klebsiella and was sent out on Levaquin.  He had ARI during that admission and was found to have a further elevation in his Cr to 3.1 and a K of 6 at his PCP and was sent to the ER for further evaluation and treatment.   He reports that he is voiding but has some intermittant mucous obstruction.   He has some increased flank pain, left > right but no fever.   A CT on admission shows minimal dilation of the collecting systems with a left ureteral stent in position.   There was some retained urine in the neobladder but no abscess or free air.    His WBC count was 21.5 on admission.    Past Medical History  Diagnosis Date  . Gout     recent flair -bilateral feet--  STABLE  . Frequency of urination   . Urgency incontinence   . Nocturia   . Diabetes mellitus type 2, diet-controlled PT STOPPED TAKING METFORMIN--  HAS BEEN WATCHING DIET, EXERCISING AND LOSING WT  . Bladder cancer 2013  . Complication of anesthesia     agitation w/awakening in 9/13,OK 01/24/12  . Hypertension   . Anxiety   . Depression   . GERD (gastroesophageal reflux disease)    Past Surgical History  Procedure Laterality Date  . Ulner artery repair  2009    rt -trauma /thrombectomy,repair  . Transurethral resection of bladder tumor  12/15/2011    Procedure: TRANSURETHRAL RESECTION OF BLADDER TUMOR (TURBT);  Surgeon: Molli Hazard, MD;  Location: [facility];  Service: Urology;  Laterality: N/A;  2 HRS Procedure reads: cysto, TURBT , Bilateral retrograde pyelograms, poss bilateral ureteroscopy with biopsy and ureter stent  placement   . Lumbar laminectomy  06-02-2005    W/ RESECTION NERVE ROOT  L4  -  L5  . Transurethral resection of bladder tumor  01/24/2012    Procedure: TRANSURETHRAL RESECTION OF BLADDER TUMOR (TURBT);  Surgeon: Molli Hazard, MD;  Location: [facility];  Service: Urology;  Laterality: N/A;  90 MIN ADD LEFT URETER BIOPSY TO PROCEDURE    . Cystoscopy with urethral dilatation  01/24/2012    Procedure: CYSTOSCOPY WITH URETHRAL DILATATION;  Surgeon: Molli Hazard, MD;  Location: [facility];  Service: Urology;  Laterality: N/A;  . Transurethral resection of bladder tumor N/A 07/10/2012    Procedure: TRANSURETHRAL RESECTION OF BLADDER TUMOR (TURBT);  Surgeon: Molli Hazard, MD;  Location: [facility];  Service: Urology;  Laterality: N/A;  . Cystoscopy w/ retrogrades Bilateral 07/10/2012    Procedure: CYSTOSCOPY WITH RETROGRADE PYELOGRAM;  Surgeon: Molli Hazard, MD;  Location: [facility];  Service: Urology;  Laterality: Bilateral;  Cystectomy with neobladder 04/26/13.  History   Social History  . Marital Status: Married    Spouse Name: N/A    Number of Children: N/A  . Years of Education: N/A   Occupational History  . Not on file.   Social History Main Topics  . Smoking status: Former  Smoker -- 1.00 packs/day for 16 years    Types: Cigarettes    Quit date: 01/20/1992  . Smokeless tobacco: Former Systems developer    Types: [place] date: 01/20/1992  . Alcohol Use: No  . Drug Use: No  . Sexual Activity: Not Currently   Other Topics Concern  . Not on file   Social History Narrative  . No narrative on file   Family History  Problem Relation Age of Onset  . Diabetes Mellitus II Other   . Hypertension Other   . Bladder Cancer Neg Hx    No Known Allergies   ROS: Negative except as above.  He has no nausea, CP, HA, SOB or calf tenderness.   He has had some loose stools since surgery.   He has no  incisional complaints.  A full review was performed.   Objective: Vital signs in last 24 hours: Temp:  [97.3 F (36.3 C)-98.7 F (37.1 C)] 98.1 F (36.7 C) (02/17 0407) Pulse Rate:  [93-101] 93 (02/17 0407) Resp:  [14-21] 14 (02/17 0407) BP: (108-133)/(52-64) 108/52 mmHg (02/17 0407) SpO2:  [93 %-100 %] 100 % (02/17 0407) Weight:  [89.359 kg (197 lb)-89.4 kg (197 lb 1.5 oz)] 89.4 kg (197 lb 1.5 oz) (02/17 0407)  Intake/Output from previous day: 02/16 0701 - 02/17 0700 In: 773.3 [I.V.:723.3; IV Piggyback:50] Out: -  Intake/Output this shift:    General appearance: alert and no distress Head: Normocephalic, without obvious abnormality, atraumatic Neck: no JVD, supple, symmetrical, trachea midline and thyroid not enlarged, symmetric, no tenderness/mass/nodules Resp: clear to auscultation bilaterally Cardio: regular rate and rhythm GI: soft, flat, +BS.   Moderate left and mild right CVAT.  Incision intact.  SP tube and ureteral stent present.   The SP tube is plugged.  Extremities: extremities normal, atraumatic, no cyanosis or edema Skin: Skin color, texture, turgor normal. No rashes or lesions Neurologic: Grossly normal with FROM and A/O x 3.  Normal mood and affect.   Lab Results:   Recent Labs  05/21/13 1527 05/21/13 1555 05/22/13 0317  WBC 21.5*  --  13.1*  HGB 11.3* 12.6* 8.6*  HCT 32.2* 37.0* 24.4*  PLT 542*  --  343   BMET  Recent Labs  05/21/13 1527 05/21/13 1555 05/22/13 0317  NA 128* 132* 133*  K 6.0* 5.9* 5.0  CL 100 110 106  CO2 11*  --  12*  GLUCOSE 102* 101* 98  BUN 90* 115* 81*  CREATININE 2.86* 3.10* 2.53*  CALCIUM 11.4*  --  9.3   PT/INR No results found for this basename: LABPROT, INR,  in the last 72 hours ABG No results found for this basename: PHART, PCO2, PO2, HCO3,  in the last 72 hours  Studies/Results: Ct Abdomen Pelvis Wo Contrast  05/21/2013   CLINICAL DATA:  History of bladder cancer with cystectomy and neobladder placement  as well as acute renal failure  EXAM: CT ABDOMEN AND PELVIS WITHOUT CONTRAST  TECHNIQUE: Multidetector CT imaging of the abdomen and pelvis was performed following the standard protocol without intravenous contrast.  COMPARISON:  CT ABD/PELV WO CM dated 05/09/2013  FINDINGS: Since the previous study the patient has undergone removal of the ureteral stent on the right. There is very mild hydronephrosis on the right more conspicuous since stent removal. The left ureteral stent extends from the skin surface into the neobladder lumen and passes retrograde into the left renal collecting system. There is mild to moderate left-sided hydronephrosis which has developed  since the previous CT scan of 09 May 2013 despite the presence of the stent. The neobladder is more distended than in the past. There are numerous surgical clips within the pelvis.  The partially contrast filled loops of small and large bowel exhibit no evidence of ileus or obstruction. The appendix fills with contrast and appears normal. The liver, gallbladder, spleen, pancreas, and adrenal glands are normal in appearance. The stomach is moderately distended with fluid and food particles. The caliber of the abdominal aorta is normal.  The lumbar vertebral bodies are preserved in height. There is grade 1 anterolisthesis of L4 with respect to L5 secondary to bilateral pars defects. There is disc space narrowing at this level as well. There is wedge compression of the body of T11. The bony pelvis exhibits no lytic nor blastic lesion. The lung bases are clear.  IMPRESSION: 1. The patient has undergone interval removal of the ureteral stent on the right with development of mild hydronephrosis. 2. There is mild hydronephrosis on the left despite the presence of the stent. The neobladder appears mildly distended as compared to the previous study. Is the neobladder being adequately drained by the catheter/stent present? 3. There is no free fluid in the abdomen or  pelvis. No findings are demonstrated to suggest an intra-abdominal abscess. 4. There is no acute hepatobiliary abnormality nor acute bowel abnormality.   Electronically Signed   By: David  Martinique   On: 05/21/2013 19:38    Anti-infectives: Anti-infectives   Start     Dose/Rate Route Frequency Ordered Stop   05/22/13 0200  piperacillin-tazobactam (ZOSYN) IVPB 3.375 g     3.375 g 12.5 mL/hr over 240 Minutes Intravenous Every 8 hours 05/21/13 1837     05/21/13 1845  piperacillin-tazobactam (ZOSYN) IVPB 3.375 g     3.375 g 100 mL/hr over 30 Minutes Intravenous  Once 05/21/13 1830 05/21/13 2107      Current Facility-Administered Medications  Medication Dose Route Frequency Provider Last Rate Last Dose  . acetaminophen (TYLENOL) tablet 650 mg  650 mg Oral Q6H PRN Domenic Polite, MD      . citalopram (CELEXA) tablet 40 mg  40 mg Oral Daily Domenic Polite, MD      . dextrose 5 % 1,000 mL with sodium bicarbonate 100 mEq infusion   Intravenous Continuous Domenic Polite, MD 100 mL/hr at 05/21/13 2246    . enoxaparin (LOVENOX) injection 30 mg  30 mg Subcutaneous Q24H Domenic Polite, MD   30 mg at 05/21/13 2242  . famotidine (PEPCID) tablet 20 mg  20 mg Oral Daily Domenic Polite, MD      . insulin aspart (novoLOG) injection 0-9 Units  0-9 Units Subcutaneous TID WC Domenic Polite, MD      . ondansetron [facility]) injection 4 mg  4 mg Intravenous Q6H PRN Domenic Polite, MD      . oxyCODONE-acetaminophen (PERCOCET/ROXICET) 5-325 MG per tablet 1 tablet  1 tablet Oral Q6H PRN Domenic Polite, MD   1 tablet at 05/22/13 0135  . piperacillin-tazobactam (ZOSYN) IVPB 3.375 g  3.375 g Intravenous Q8H Rande Lawman Rumbarger, RPH   3.375 g at 05/22/13 [id number]   Facility-Administered Medications Ordered in Other Encounters  Medication Dose Route Frequency Provider Last Rate Last Dose  . levofloxacin (LEVAQUIN) IVPB 500 mg  500 mg Intravenous Q24H Molli Hazard, MD       I have reviewed his labs and CT films and  report along with notes from his prior admission and our office.  Assessment: He has worsening ARI with no abscess or obvious ureteral obstruction. He has some urinary obstruction from mucous plugging and may not be emptying his neobladder adequately with voiding. Agree with empiric antibiotics pending cultures.   Plan: I am going to have his SP tube placed back to straight drainage and have it irrigated with NS qshift. I will notify Dr. Jasmine December of his admission.       LOS: 1 day    Euna Armon J 05/22/2013

## 2013-05-22 NOTE — Progress Notes (Signed)
Utilization Review Completed.  

## 2013-05-22 NOTE — Progress Notes (Addendum)
[facility] Progress Note [place] TEAM 1 - Stepdown/ICU TEAM   [name] [name] [id number] DOB: [birth year] DOA: 05/21/2013 PCP: Florina Ou, MD  Brief narrative: [name] [name] is a 52 y.o. male presenting on 05/21/2013 with  has a past medical history of Gout; Frequency of urination; Urgency incontinence; Nocturia; Diabetes mellitus type 2, diet-controlled (PT STOPPED TAKING METFORMIN--  HAS BEEN WATCHING DIET, EXERCISING AND LOSING WT); Bladder cancer (2013); Complication of anesthesia; Hypertension; Anxiety; Depression; and GERD (gastroesophageal reflux disease). On January 22 he had cystectomy, neobladder creation surgery, placement of a suprapubic catheter and stent in left ureter.  Following this he was admitted on 2/4 through 2/8 with sepsis secondary to Klebsiella pyelonephritis.  He also had acute renal failure and metabolic acidosis which improved, he was discharged home on Po Levaquin.  He subsequently went back to the [facility] of [place] and had urethral cathter removed and stent in L ureter removed. His renal function worsened and he was given IVFand discharged home with a creatinine in 2 range and advised to have blood work done on Monday.  He had blood work done on 2/16 through PCP's office and was sent to ER due to bump in creatinine to 3.1 and K of 6.0.  Subjective: Feeling "much better". No new complaints.   Assessment/Plan: Principal Problem:   ARF -  Metabolic acidosis - improving with IVF - SP tube replaced and irrigated as it is suspected that he had mucous plugging.  - cont IV Bicarb  Active Problems:   Leucocytosis - cont antibiotics and follow cultures    HTN (hypertension) - BP stable off antihypertensives     Bladder cancer - s/p cystectomy and neobladder formation  DM - Amaryl on hold- cont SSI    Hyperkalemia - improving    Code Status: full code  Family Communication: with wife at bedside Disposition Plan:  home  Consultants: Urology  Procedures: none  Antibiotics: Zosyn 2/16  DVT prophylaxis: Lovenox   Objective: Filed Weights   05/21/13 1525 05/22/13 0407  Weight: 89.359 kg (197 lb) 89.4 kg (197 lb 1.5 oz)   Blood pressure 126/78, pulse 94, temperature 98 F (36.7 C), temperature source Oral, resp. rate 18, height 5\' 10"  (1.778 m), weight 89.4 kg (197 lb 1.5 oz), SpO2 100.00%.  Intake/Output Summary (Last 24 hours) at 05/22/13 1502 Last data filed at 05/22/13 1143  Gross per 24 hour  Intake 973.33 ml  Output    850 ml  Net 123.33 ml     Exam: General: No acute respiratory distress Lungs: Clear to auscultation bilaterally without wheezes or crackles Cardiovascular: Regular rate and rhythm without murmur gallop or rub normal S1 and S2 Abdomen: Nontender, nondistended, soft, bowel sounds positive, no rebound, no ascites, no appreciable mass- suprapubic cath present  Extremities: No significant cyanosis, clubbing, or edema bilateral lower extremities  Data Reviewed: Basic Metabolic Panel:  Recent Labs Lab 05/21/13 1527 05/21/13 1555 05/22/13 0317  NA 128* 132* 133*  K 6.0* 5.9* 5.0  CL 100 110 106  CO2 11*  --  12*  GLUCOSE 102* 101* 98  BUN 90* 115* 81*  CREATININE 2.86* 3.10* 2.53*  CALCIUM 11.4*  --  9.3   Liver Function Tests: No results found for this basename: AST, ALT, ALKPHOS, BILITOT, PROT, ALBUMIN,  in the last 168 hours No results found for this basename: LIPASE, AMYLASE,  in the last 168 hours No results found for this basename: AMMONIA,  in the last 168  hours CBC:  Recent Labs Lab 05/21/13 1527 05/21/13 1555 05/22/13 0317  WBC 21.5*  --  13.1*  HGB 11.3* 12.6* 8.6*  HCT 32.2* 37.0* 24.4*  MCV 83.9  --  84.1  PLT 542*  --  343   Cardiac Enzymes: No results found for this basename: CKTOTAL, CKMB, CKMBINDEX, TROPONINI,  in the last 168 hours BNP (last 3 results) No results found for this basename: PROBNP,  in the last 8760  hours CBG:  Recent Labs Lab 05/21/13 2138 05/21/13 2311 05/22/13 0816 05/22/13 1150  GLUCAP 70 113* 118* 80    Recent Results (from the past 240 hour(s))  MRSA PCR SCREENING     Status: None   Collection Time    05/21/13  9:15 PM      Result Value Ref Range Status   MRSA by PCR NEGATIVE  NEGATIVE Final   Comment:            The GeneXpert MRSA Assay (FDA     approved for NASAL specimens     only), is one component of a     comprehensive MRSA colonization     surveillance program. It is not     intended to diagnose MRSA     infection nor to guide or     monitor treatment for     MRSA infections.     Studies:  Recent x-ray studies have been reviewed in detail by the Attending Physician  Scheduled Meds:  Scheduled Meds: . citalopram  40 mg Oral Daily  . enoxaparin (LOVENOX) injection  40 mg Subcutaneous Q24H  . famotidine  20 mg Oral Daily  . insulin aspart  0-9 Units Subcutaneous TID WC  . piperacillin-tazobactam (ZOSYN)  IV  3.375 g Intravenous Q8H   Continuous Infusions: . dextrose 5 % 1,000 mL with sodium bicarbonate 100 mEq infusion 100 mL/hr at 05/22/13 [id number]    Time spent on care of this patient: > 35 min   Solange Emry, MD  [facility] Office  [phone] Pager - Text Page per Shea Evans as per below:  On-Call/Text Page:      [email or web address]      password [email or web address]  If 7PM-7AM, please contact night-coverage [email or web address] Password [email or web address] 05/22/2013, 3:02 PM   LOS: 1 day

## 2013-05-23 LAB — GLUCOSE, CAPILLARY
Glucose-Capillary: 151 mg/dL — ABNORMAL HIGH (ref 70–99)
Glucose-Capillary: 146 mg/dL — ABNORMAL HIGH (ref 70–99)
Glucose-Capillary: 232 mg/dL — ABNORMAL HIGH (ref 70–99)
Glucose-Capillary: 179 mg/dL — ABNORMAL HIGH (ref 70–99)

## 2013-05-23 LAB — CBC
HEMATOCRIT: 24 % — AB (ref 39.0–52.0)
Hemoglobin: 8.5 g/dL — ABNORMAL LOW (ref 13.0–17.0)
MCH: 29.5 pg (ref 26.0–34.0)
MCHC: 35.4 g/dL (ref 30.0–36.0)
MCV: 83.3 fL (ref 78.0–100.0)
PLATELETS: 298 10*3/uL (ref 150–400)
RBC: 2.88 MIL/uL — AB (ref 4.22–5.81)
RDW: 13.5 % (ref 11.5–15.5)
WBC: 12.6 10*3/uL — AB (ref 4.0–10.5)

## 2013-05-23 LAB — BASIC METABOLIC PANEL
BUN: 65 mg/dL — ABNORMAL HIGH (ref 6–23)
CALCIUM: 9.1 mg/dL (ref 8.4–10.5)
CHLORIDE: 105 meq/L (ref 96–112)
CO2: 19 mEq/L (ref 19–32)
Creatinine, Ser: 2.18 mg/dL — ABNORMAL HIGH (ref 0.50–1.35)
GFR calc Af Amer: 38 mL/min — ABNORMAL LOW (ref >90)
GFR calc non Af Amer: 33 mL/min — ABNORMAL LOW (ref >90)
Glucose, Bld: 110 mg/dL — ABNORMAL HIGH (ref 70–99)
Potassium: 4.3 mEq/L (ref 3.7–5.3)
SODIUM: 136 meq/L — AB (ref 137–147)

## 2013-05-23 LAB — D-DIMER, QUANTITATIVE (NOT AT ARMC): D DIMER QUANT: 3.38 ug{FEU}/mL — AB (ref 0.00–0.48)

## 2013-05-23 MED ORDER — SODIUM CHLORIDE 0.9 % IV SOLN
INTRAVENOUS | Status: DC
  Administered 2013-05-23: 1000 mL via INTRAVENOUS
  Administered 2013-05-24 – 2013-05-25 (×2): via INTRAVENOUS

## 2013-05-23 MED ORDER — HYDRALAZINE HCL 10 MG PO TABS
10.0000 mg | ORAL_TABLET | Freq: Three times a day (TID) | ORAL | Status: DC
  Administered 2013-05-23 – 2013-05-25 (×6): 10 mg via ORAL
  Filled 2013-05-23 (×8): qty 1

## 2013-05-23 NOTE — Progress Notes (Signed)
Urology Progress Note  Subjective:     No acute urologic events overnight. Has some chronic left back pain that was present before surgery and remains unchanged.  He was not voiding every 2 hours as recommended, but every 4 hours. When he would unplug his SP tube it did not drain.   ROS: Negative: chest pain or SOB.  Objective:  Patient Vitals for the past 24 hrs:  BP Temp Temp src Pulse Resp SpO2 Weight  05/23/13 0808 129/67 mmHg 98 F (36.7 C) Oral - - - -  05/23/13 0718 118/62 mmHg 98.2 F (36.8 C) Oral 98 16 97 % -  05/23/13 0344 117/68 mmHg 98.7 F (37.1 C) Oral 100 18 96 % 89.4 kg (197 lb 1.5 oz)  05/22/13 2340 131/64 mmHg 98.7 F (37.1 C) Oral 100 16 96 % -  05/22/13 2029 128/62 mmHg 98.7 F (37.1 C) Oral 101 15 97 % -  05/22/13 1800 122/60 mmHg - - 99 20 99 % -  05/22/13 1558 128/62 mmHg 99 F (37.2 C) Oral 92 15 95 % -  05/22/13 1142 126/78 mmHg 98 F (36.7 C) Oral 94 18 100 % -    Physical Exam: General:  No acute distress, awake Cardiovascular:    [x]   S1/S2 present, RRR  []   Irregularly irregular Chest:  CTA-B Abdomen:               []  Soft, appropriately TTP  []  Soft, NTTP  [x]  Soft, NTTP, incision(s) clean/dry/intact  Genitourinary: SP draining clear yellow urine w/ some mucus. Left ureter stent w/ scant yellow urine. Foley:  None.    I/O last 3 completed shifts: In: 3323.3 [P.O.:200; I.V.:2923.3; IV Piggyback:200] Out: 4000 [Urine:4000]  Recent Labs     05/22/13  0317  05/23/13  0317  HGB  8.6*  8.5*  WBC  13.1*  12.6*  PLT  343  298    Recent Labs     05/22/13  0317  05/23/13  0317  NA  133*  136*  K  5.0  4.3  CL  106  105  CO2  12*  19  BUN  81*  65*  CREATININE  2.53*  2.18*  CALCIUM  9.3  9.1  GFRNONAA  27*  33*  GFRAA  31*  38*     No results found for this basename: PT, INR, APTT,  in the last 72 hours   No components found with this basename: ABG,     Length of stay: 2 days.  Assessment: Acute renal  failure. Radical cystectomy w/ creation of neobladder in [place], [place].   Plan: -Urine culture shows no growth. -Blood cultures pending. -Reviewed CT; no evidence of obstruction and no indication for surgical intervention. -Leave SP tube to drainage. I explained that he will need to f/u in [place] for final disposition of his tubes, but that once the SP tube is out, he will need to learn self cath via the urethra.   Rolan Bucco, MD [phone]

## 2013-05-23 NOTE — Clinical Documentation Improvement (Signed)
Possible Clinical Conditions?  Recurrent  Sepsis Severe Sepsis Other Condition  Cannot clinically Determine   Risk Factors:  Patient had recent inpatient admission for sepsis and sent home on Levaquin.   Per 05/21/13 H&P  Leukocytosis  -afebrile, but concern for recurrent early sepsis related to urinary source  -will check Urine, Blood Cx  -check lactic acid  -empiric Zosyn  -FU Ct abd pelvis    Thank You, [name] [name] ,RN Clinical Documentation Specialist:  [place] [employer]

## 2013-05-23 NOTE — Progress Notes (Addendum)
Wife called nurse to bedside,urethral stent almost completely . Dr. Jasmine December made aware and ok with removing stent. Urethral stent removed.

## 2013-05-23 NOTE — Progress Notes (Addendum)
Progress Note [place] TEAM 1 - Stepdown/ICU TEAM   [name] [name] [id number] DOB: 1960-04-22 DOA: 05/21/2013 PCP: Florina Ou, MD  Brief narrative: 53 y.o. male admitted on 05/21/2013 with history of Gout; Diabetes mellitus type 2, diet-controlled; Bladder cancer (2013); Complication of anesthesia; Hypertension; Anxiety; Depression; and GERD. On January 22 he had cystectomy, neobladder creation, placement of a suprapubic catheter and a stent placed in the left ureter. Following this he was admitted on 2/4 through 2/8 with sepsis secondary to Klebsiella pyelonephritis. He also had acute renal failure and metabolic acidosis which improved.  He was discharged home on oral Levaquin. He subsequently went back to the [facility] and had urethral cathter removed and stent in L ureter removed. His renal function worsened and he was given IVFand discharged home with a creatinine in 2 range and advised to have blood work done on the following Monday.  He had blood work done on 2/16 through PCP's office and was sent to ER due to bump in creatinine to 3.1 and K of 6.0.  Subjective: No complaints today.  Noted passing of his ureteral stent.  No back pain, n/v, abdom pain, or cp.  Good appetite.  Has not yet been up out in hallway.    Assessment/Plan:  ARF on CKD w/ Metabolic acidosis - improving with IVF - SP tube replaced and irrigated as it is suspected that he had mucous plugging - baseline crt reportedly ~2.0 - crt has nearly returned to his baseline at this time  - stop IV Bicarb today and follow serum bicarb w/ improved renal function   SIRS - at admit WBC 21.5 + HR 95 = SIRS - no source of infection identified, therefore not sepsis - SIRS physiology improving, but not yet resolved   - d/c abx as there is no objective evidence of infection, and urine findings can be otherwise identified   HTN - BP has been stable off antihypertensives, but climbing w/ volume -  resume hydralazine and follow   Sinus tachycardia - check d-dimer as pt would be at risk for PE given CA diagnosis and recent repeat hospitalizations - Well's score is marginal at 4 - will not emprically anticoag w/o definitve proof of thrombus as pt high risk for bleeding given recent extensive surgery and passing of urologic stent today    Bladder cancer - s/p cystectomy and neobladder formation - passed stent today - Urology following - no procedural plans this admit   DM - CBG variable - oral agent on hold - follow w/o change today   Hyperkalemia - due to ARF - resolved  Code Status: FULL  Family Communication: no family present at time of exam  Disposition Plan: PT eval - possible d/c in next 24-48hrs   Consultants: Urology  Procedures: none  Antibiotics: Zosyn 2/16 >> 2/18  DVT prophylaxis: Lovenox   Objective: Filed Weights   05/21/13 1525 05/22/13 0407 05/23/13 0344  Weight: 89.359 kg (197 lb) 89.4 kg (197 lb 1.5 oz) 89.4 kg (197 lb 1.5 oz)   Blood pressure 136/68, pulse 105, temperature 98.1 F (36.7 C), temperature source Oral, resp. rate 16, height 5\' 10"  (1.778 m), weight 89.4 kg (197 lb 1.5 oz), SpO2 100.00%.  Intake/Output Summary (Last 24 hours) at 05/23/13 1259 Last data filed at 05/23/13 0600  Gross per 24 hour  Intake   1700 ml  Output   3150 ml  Net  -1450 ml   Exam: General: No acute respiratory  distress at rest  Lungs: Clear to auscultation bilaterally without wheezes or crackles Cardiovascular: regular but tachycardic - no gallup, rub, or murmer  Abdomen: Nontender, nondistended, soft, bowel sounds positive, no rebound, no ascites, no appreciable mass- suprapubic cath present  Extremities: No significant cyanosis, clubbing, or edema bilateral lower extremities  Data Reviewed: Basic Metabolic Panel:  Recent Labs Lab 05/21/13 1527 05/21/13 1555 05/22/13 0317 05/23/13 0317  NA 128* 132* 133* 136*  K 6.0* 5.9* 5.0 4.3  CL 100 110 106  105  CO2 11*  --  12* 19  GLUCOSE 102* 101* 98 110*  BUN 90* 115* 81* 65*  CREATININE 2.86* 3.10* 2.53* 2.18*  CALCIUM 11.4*  --  9.3 9.1   Liver Function Tests: No results found for this basename: AST, ALT, ALKPHOS, BILITOT, PROT, ALBUMIN,  in the last 168 hours  CBC:  Recent Labs Lab 05/21/13 1527 05/21/13 1555 05/22/13 0317 05/23/13 0317  WBC 21.5*  --  13.1* 12.6*  HGB 11.3* 12.6* 8.6* 8.5*  HCT 32.2* 37.0* 24.4* 24.0*  MCV 83.9  --  84.1 83.3  PLT 542*  --  343 298   CBG:  Recent Labs Lab 05/22/13 0816 05/22/13 1150 05/22/13 1703 05/22/13 2033 05/23/13 0720  GLUCAP 118* 80 165* 268* 151*    Recent Results (from the past 240 hour(s))  CULTURE, BLOOD (ROUTINE X 2)     Status: None   Collection Time    05/21/13  7:45 PM      Result Value Ref Range Status   Specimen Description BLOOD LEFT HAND   Final   Special Requests BOTTLES DRAWN AEROBIC AND ANAEROBIC 5CC   Final   Culture  Setup Time     Final   Value: 05/22/2013 00:45     Performed at [facility]   Culture     Final   Value:        BLOOD CULTURE RECEIVED NO GROWTH TO DATE CULTURE WILL BE HELD FOR 5 DAYS BEFORE ISSUING A FINAL NEGATIVE REPORT     Performed at [facility]   Report Status PENDING   Incomplete  CULTURE, BLOOD (ROUTINE X 2)     Status: None   Collection Time    05/21/13  7:50 PM      Result Value Ref Range Status   Specimen Description BLOOD HAND RIGHT   Final   Special Requests BOTTLES DRAWN AEROBIC ONLY 10CC   Final   Culture  Setup Time     Final   Value: 05/22/2013 00:44     Performed at [facility]   Culture     Final   Value:        BLOOD CULTURE RECEIVED NO GROWTH TO DATE CULTURE WILL BE HELD FOR 5 DAYS BEFORE ISSUING A FINAL NEGATIVE REPORT     Performed at [facility]   Report Status PENDING   Incomplete  URINE CULTURE     Status: None   Collection Time    05/21/13  8:08 PM      Result Value Ref Range Status   Specimen Description URINE,  CLEAN CATCH   Final   Special Requests Immunocompromised   Final   Culture  Setup Time     Final   Value: 05/22/2013 03:50     Performed at [facility] Count     Final   Value: NO GROWTH     Performed at [facility]  Culture     Final   Value: NO GROWTH     Performed at [facility]   Report Status 05/22/2013 FINAL   Final  MRSA PCR SCREENING     Status: None   Collection Time    05/21/13  9:15 PM      Result Value Ref Range Status   MRSA by PCR NEGATIVE  NEGATIVE Final   Comment:            The GeneXpert MRSA Assay (FDA     approved for NASAL specimens     only), is one component of a     comprehensive MRSA colonization     surveillance program. It is not     intended to diagnose MRSA     infection nor to guide or     monitor treatment for     MRSA infections.     Studies:  Recent x-ray studies have been reviewed in detail by the Attending Physician  Scheduled Meds:  Scheduled Meds: . citalopram  40 mg Oral Daily  . enoxaparin (LOVENOX) injection  40 mg Subcutaneous Q24H  . famotidine  20 mg Oral Daily  . insulin aspart  0-9 Units Subcutaneous TID WC  . piperacillin-tazobactam (ZOSYN)  IV  3.375 g Intravenous Q8H    Time spent on care of this patient: 35 mins   Phoua Hoadley T, MD  [facility] Office  [phone] Pager - Text Page per Shea Evans as per below:  On-Call/Text Page:      [email or web address]      password [email or web address]  If 7PM-7AM, please contact night-coverage [email or web address] Password [email or web address] 05/23/2013, 12:59 PM   LOS: 2 days

## 2013-05-24 ENCOUNTER — Inpatient Hospital Stay (HOSPITAL_COMMUNITY): Payer: BC Managed Care – PPO

## 2013-05-24 DIAGNOSIS — N19 Unspecified kidney failure: Secondary | ICD-10-CM

## 2013-05-24 LAB — BASIC METABOLIC PANEL
BUN: 48 mg/dL — ABNORMAL HIGH (ref 6–23)
CALCIUM: 9.4 mg/dL (ref 8.4–10.5)
CO2: 17 mEq/L — ABNORMAL LOW (ref 19–32)
CREATININE: 1.71 mg/dL — AB (ref 0.50–1.35)
Chloride: 106 mEq/L (ref 96–112)
GFR, EST AFRICAN AMERICAN: 51 mL/min — AB (ref >90)
GFR, EST NON AFRICAN AMERICAN: 44 mL/min — AB (ref >90)
Glucose, Bld: 97 mg/dL (ref 70–99)
Potassium: 4.4 mEq/L (ref 3.7–5.3)
SODIUM: 136 meq/L — AB (ref 137–147)

## 2013-05-24 LAB — CBC
HCT: 25.1 % — ABNORMAL LOW (ref 39.0–52.0)
Hemoglobin: 8.7 g/dL — ABNORMAL LOW (ref 13.0–17.0)
MCH: 28.9 pg (ref 26.0–34.0)
MCHC: 34.7 g/dL (ref 30.0–36.0)
MCV: 83.4 fL (ref 78.0–100.0)
Platelets: 324 10*3/uL (ref 150–400)
RBC: 3.01 MIL/uL — ABNORMAL LOW (ref 4.22–5.81)
RDW: 13.5 % (ref 11.5–15.5)
WBC: 12.8 10*3/uL — AB (ref 4.0–10.5)

## 2013-05-24 LAB — GLUCOSE, CAPILLARY
GLUCOSE-CAPILLARY: 267 mg/dL — AB (ref 70–99)
Glucose-Capillary: 121 mg/dL — ABNORMAL HIGH (ref 70–99)
Glucose-Capillary: 111 mg/dL — ABNORMAL HIGH (ref 70–99)
Glucose-Capillary: 175 mg/dL — ABNORMAL HIGH (ref 70–99)

## 2013-05-24 MED ORDER — TECHNETIUM TC 99M DIETHYLENETRIAME-PENTAACETIC ACID: 40.0000 | Freq: Once | INTRAVENOUS | Status: AC | PRN

## 2013-05-24 MED ORDER — TECHNETIUM TO 99M ALBUMIN AGGREGATED
6.0000 | Freq: Once | INTRAVENOUS | Status: AC | PRN
  Administered 2013-05-24: 6 via INTRAVENOUS

## 2013-05-24 NOTE — Progress Notes (Signed)
Bilateral lower extremity venous duplex:  No evidence of DVT, superficial thrombosis, or Baker's Cyst.   

## 2013-05-24 NOTE — Plan of Care (Signed)
D dimer 3.38- defer to rounding MD as to whether CTA vs VQ scan inidcated  Erin Hearing, ANP

## 2013-05-24 NOTE — Evaluation (Addendum)
Physical Therapy Evaluation and D/C Patient Details Name: [name] [name] MRN: [id number] DOB: 1961-02-26 Today's Date: 05/24/2013 Time: [id number] PT Time Calculation (min): 21 min  PT Assessment / Plan / Recommendation History of Present Illness  [name] [name] is a 53 y.o. male with history of bladder cancer who has had surgery done at [facility] at [place] 2 weeks ago with neobladder placement and bilateral ureteral stent placement with suprapubic catheter was found to be tachycardic since last Tuesday almost a week now. Patient was found to be having mild fever and blood work shows leukocytosis with acute renal failure and metabolic acidosis.   Clinical Impression  Pt admitted with above. Pt currently without functional limitations at this time.  Pt does not need skilled PT.  Will sign off.      PT Assessment  Patent does not need any further PT services    Follow Up Recommendations  No PT follow up    Does the patient have the potential to tolerate intense rehabilitation      Barriers to Discharge        Equipment Recommendations  None recommended by PT    Recommendations for Other Services     Frequency      Precautions / Restrictions Precautions Precautions: None Restrictions Weight Bearing Restrictions: No   Pertinent Vitals/Pain VSS, No pain     Mobility  Bed Mobility Overal bed mobility: Modified Independent Transfers Overall transfer level: Modified independent Ambulation/Gait Ambulation/Gait assistance: Independent Ambulation Distance (Feet): 450 Feet Assistive device: None Gait Pattern/deviations: WFL(Within Functional Limits) Gait velocity interpretation: >2.62 ft/sec, indicative of independent community ambulator General Gait Details: Pt ambulated without device without LOB.  Pt able to withstand perturbations to balance as well.  No difficulties with balance.  DOE 2/4.      Exercises     PT Diagnosis:    PT Problem List:   PT  Treatment Interventions:       PT Goals(Current goals can be found in the care plan section) Acute Rehab PT Goals PT Goal Formulation: No goals set, d/c therapy  Visit Information  Last PT Received On: 05/24/13 Assistance Needed: +1 History of Present Illness: [name] [name] is a 53 y.o. male with history of bladder cancer who has had surgery done at [facility] at [place] 2 weeks ago with neobladder placement and bilateral ureteral stent placement with suprapubic catheter was found to be tachycardic since last Tuesday almost a week now. Patient was found to be having mild fever and blood work shows leukocytosis with acute renal failure and metabolic acidosis.        Prior [place] expects to be discharged to:: Private residence Living Arrangements: Spouse/significant other Available Help at Discharge: Family Type of Home: House Home Access: Stairs to enter Technical brewer of Steps: 3 Entrance Stairs-Rails: None Home Layout: One level Home Equipment: None Prior Function Level of Independence: Independent Communication Communication: No difficulties Dominant Hand: Right    Cognition  Cognition Arousal/Alertness: Awake/alert Behavior During Therapy: WFL for tasks assessed/performed Overall Cognitive Status: Within Functional Limits for tasks assessed    Extremity/Trunk Assessment Upper Extremity Assessment Upper Extremity Assessment: Defer to OT evaluation Lower Extremity Assessment Lower Extremity Assessment: Overall WFL for tasks assessed   Balance Balance Overall balance assessment: Independent  End of Session PT - End of Session Equipment Utilized During Treatment: Gait belt Activity Tolerance: Patient tolerated treatment well Patient left: in chair;with call bell/phone within reach;with  family/visitor present Nurse Communication: Mobility status  GP     [name],[name] Erber 05/24/2013, 2:44 PM  [facility] Acute  Rehabilitation [phone] (pager)

## 2013-05-24 NOTE — Progress Notes (Signed)
[facility] PROGRESS NOTE  [name] [name] [id number] DOB: [birth year] DOA: 05/21/2013 PCP: Florina Ou, MD  Brief narrative: 53 y.o. male admitted on 05/21/2013 with history of Gout; Diabetes mellitus type 2, diet-controlled; Bladder cancer (2013); Hypertension; Anxiety; Depression; and GERD. On January 22 he had cystectomy, neobladder creation, placement of a suprapubic catheter and a stent placed in the left ureter, all apparently done in [place]. Following this he was admitted on 2/4 through 2/8 with sepsis secondary to Klebsiella pyelonephritis. He also had acute renal failure and metabolic acidosis which improved. He was discharged home on oral Levaquin. He subsequently went back to the [facility] and had urethral cathter removed and stent in L ureter removed. His renal function worsened and he was given IVFand discharged home with a creatinine in 2 range and advised to have blood work done on the following Monday. He had blood work done on 2/16 through PCP's office and was sent to ER due to bump in creatinine to 3.1 and K of 6.0.  He was subsequently admitted for Acute renal failure and hyperkalemia. Urology was consulted and has been following. (some of the above was copied from previous notes)   Assessment/Plan  ARF on CKD w/ Metabolic acidosis  - improving with IVF, Cr down to 1.71 today and Bicarb is stable at 17 - SP tube replaced and irrigated this admission as it is suspected that he had mucous plugging  - baseline crt reportedly ~2.0  - IV bicarb discontinued yesterday and bicarb dropped slightly.  He is now on IVF with NS at 50cc/hr  SIRS  - on admit WBC 21.5 + HR 95 = SIRS  - no source of infection identified, therefore not sepsis  - SIRS physiology improving, but not yet resolved, tachycardia persists - d/c abx as there is no objective evidence of infection, and urine findings can be otherwise identified  - Ddimer elevated > 3 and with  tachycardia - VQ scan ordered and discussed with patient.   HTN  - BP has been stable off antihypertensives, but climbing w/ volume  - Hydralazine resumed yesterday, SBP 110s-140s - Amlodipine continues to be on hold.   Persistent Sinus tachycardia  - Ddimer elevated > 3 - Well's score is marginal at 4   - Plan for VQ scan as D dimer elevated.  He has many reasons for this to be elevated, but given his risk factors, would like to rule out a PE.  After discussion with patient he is amenable to study.   Bladder cancer  - s/p cystectomy and neobladder formation  - Urology following  - no procedural plans this admit  - SP catheter in place - To follow up outpatient with [place]  DM  - CBG labile, ranging from 120s-260s - oral agent on hold (glimepiride) - Likely resume oral medication at discharge.    Hyperkalemia  - due to ARF - resolved   Consultants:  Urology  Procedures/Studies:  No results found.   Antibiotics:  None (stopped due to no obvious source of infection)  Code Status: Full Family Communication: Pt at bedside Disposition Plan: Home when medically stable  HPI/Subjective: No events overnight.  Patient is ready to go home, will do VQ scan today/tomorrow and home likely Friday.   Objective: Filed Vitals:   05/24/13 0001 05/24/13 0400 05/24/13 0909 05/24/13 1152  BP: 131/70 125/67 117/70 143/71  Pulse: 98 92  99  Temp: 98.7 F (37.1 C) 98.3  F (36.8 C)  98.1 F (36.7 C)  TempSrc: Oral Oral  Oral  Resp: 16 21  16   Height:      Weight:      SpO2: 99% 96%  100%    Intake/Output Summary (Last 24 hours) at 05/24/13 1440 Last data filed at 05/24/13 1407  Gross per 24 hour  Intake   1630 ml  Output   4250 ml  Net  -2620 ml    Exam:   General:  Pt is alert, follows commands appropriately, not in acute distress  Cardiovascular: Regular rate and rhythm, S1/S2  Respiratory: Clear to auscultation bilaterally, no  wheezing  Abdomen: Soft, non tender, non distended, bowel sounds present, SP catheter in place, site is CDI  Extremities: No edema  Neuro: Grossly nonfocal  Data Reviewed: Basic Metabolic Panel:  Recent Labs Lab 05/21/13 1527 05/21/13 1555 05/22/13 0317 05/23/13 0317 05/24/13 0251  NA 128* 132* 133* 136* 136*  K 6.0* 5.9* 5.0 4.3 4.4  CL 100 110 106 105 106  CO2 11*  --  12* 19 17*  GLUCOSE 102* 101* 98 110* 97  BUN 90* 115* 81* 65* 48*  CREATININE 2.86* 3.10* 2.53* 2.18* 1.71*  CALCIUM 11.4*  --  9.3 9.1 9.4   CBC:  Recent Labs Lab 05/21/13 1527 05/21/13 1555 05/22/13 0317 05/23/13 0317 05/24/13 0251  WBC 21.5*  --  13.1* 12.6* 12.8*  HGB 11.3* 12.6* 8.6* 8.5* 8.7*  HCT 32.2* 37.0* 24.4* 24.0* 25.1*  MCV 83.9  --  84.1 83.3 83.4  PLT 542*  --  343 298 324   CBG:  Recent Labs Lab 05/23/13 1226 05/23/13 1727 05/23/13 2105 05/24/13 0812 05/24/13 1151  GLUCAP 146* 232* 179* 121* 267*    Recent Results (from the past 240 hour(s))  CULTURE, BLOOD (ROUTINE X 2)     Status: None   Collection Time    05/21/13  7:45 PM      Result Value Ref Range Status   Specimen Description BLOOD LEFT HAND   Final   Special Requests BOTTLES DRAWN AEROBIC AND ANAEROBIC 5CC   Final   Culture  Setup Time     Final   Value: 05/22/2013 00:45     Performed at [facility]   Culture     Final   Value:        BLOOD CULTURE RECEIVED NO GROWTH TO DATE CULTURE WILL BE HELD FOR 5 DAYS BEFORE ISSUING A FINAL NEGATIVE REPORT     Performed at [facility]   Report Status PENDING   Incomplete  CULTURE, BLOOD (ROUTINE X 2)     Status: None   Collection Time    05/21/13  7:50 PM      Result Value Ref Range Status   Specimen Description BLOOD HAND RIGHT   Final   Special Requests BOTTLES DRAWN AEROBIC ONLY 10CC   Final   Culture  Setup Time     Final   Value: 05/22/2013 00:44     Performed at [facility]   Culture     Final   Value:        BLOOD CULTURE  RECEIVED NO GROWTH TO DATE CULTURE WILL BE HELD FOR 5 DAYS BEFORE ISSUING A FINAL NEGATIVE REPORT     Performed at [facility]   Report Status PENDING   Incomplete  URINE CULTURE     Status: None   Collection Time    05/21/13  8:08 PM  Result Value Ref Range Status   Specimen Description URINE, CLEAN CATCH   Final   Special Requests Immunocompromised   Final   Culture  Setup Time     Final   Value: 05/22/2013 03:50     Performed at [facility] Count     Final   Value: NO GROWTH     Performed at [facility]   Culture     Final   Value: NO GROWTH     Performed at [facility]   Report Status 05/22/2013 FINAL   Final  MRSA PCR SCREENING     Status: None   Collection Time    05/21/13  9:15 PM      Result Value Ref Range Status   MRSA by PCR NEGATIVE  NEGATIVE Final   Comment:            The GeneXpert MRSA Assay (FDA     approved for NASAL specimens     only), is one component of a     comprehensive MRSA colonization     surveillance program. It is not     intended to diagnose MRSA     infection nor to guide or     monitor treatment for     MRSA infections.     Scheduled Meds: . citalopram  40 mg Oral Daily  . enoxaparin (LOVENOX) injection  40 mg Subcutaneous Q24H  . famotidine  20 mg Oral Daily  . hydrALAZINE  10 mg Oral TID  . insulin aspart  0-9 Units Subcutaneous TID WC   Continuous Infusions: . sodium chloride 50 mL/hr at 05/24/13 0917     Gilles Chiquito, MD  [place] Pager [phone]  If 7PM-7AM, please contact night-coverage [email or web address] Password [email or web address] 05/24/2013, 2:40 PM   LOS: 3 days

## 2013-05-25 LAB — BASIC METABOLIC PANEL
BUN: 41 mg/dL — ABNORMAL HIGH (ref 6–23)
CO2: 17 mEq/L — ABNORMAL LOW (ref 19–32)
Calcium: 9.8 mg/dL (ref 8.4–10.5)
Chloride: 106 mEq/L (ref 96–112)
Creatinine, Ser: 1.53 mg/dL — ABNORMAL HIGH (ref 0.50–1.35)
GFR calc non Af Amer: 50 mL/min — ABNORMAL LOW (ref >90)
GFR, EST AFRICAN AMERICAN: 58 mL/min — AB (ref >90)
Glucose, Bld: 111 mg/dL — ABNORMAL HIGH (ref 70–99)
POTASSIUM: 4.7 meq/L (ref 3.7–5.3)
Sodium: 138 mEq/L (ref 137–147)

## 2013-05-25 LAB — GLUCOSE, CAPILLARY
GLUCOSE-CAPILLARY: 192 mg/dL — AB (ref 70–99)
Glucose-Capillary: 134 mg/dL — ABNORMAL HIGH (ref 70–99)

## 2013-05-25 NOTE — Discharge Summary (Signed)
DISCHARGE SUMMARY  [name] [name]  MR#: [id number]  DOB:[birth year]  Date of Admission: 05/21/2013 Date of Discharge: 05/25/2013  Attending Physician:Kaylie Ritter T  Patient's ML:[id number], [name], MD  Consults: Urology  Disposition: D/C home  Follow-up Appts:     Follow-up Information   Follow up with Florina Ou, MD. (Keep your scheduled appointment for Monday, 05/28/2013)    Specialty:  Family Medicine   Contact information:   [address] [place] [address] [phone]      Tests Needing Follow-up: -) BMET is suggested in f/u (due for visit in 3 days) to check bicarb and renal function -) check BP and consider resuming Norvasc if indicated   Discharge Diagnoses: ARF on CKD w/ Metabolic acidosis  SIRS (not sepsis) HTN  Sinus tachycardia  Bladder cancer  DM  Hyperkalemia   Initial presentation: 53 y.o. male admitted on 05/21/2013 with history of Gout; Diabetes mellitus type 2, diet-controlled; Bladder cancer (2013); Hypertension; Anxiety; Depression; and GERD. On January 22 he had a cystectomy, neobladder creation, placement of a suprapubic catheter, and a stent placed in the left ureter. Following this he was admitted on 2/4 through 2/8 with sepsis secondary to Klebsiella pyelonephritis. He also had acute renal failure and metabolic acidosis which improved. He was discharged home on oral Levaquin. He subsequently went back to the [facility] and had urethral cathter removed and stent in L ureter removed. His renal function worsened and he was given IVF and discharged home with a creatinine in 2 range and advised to have blood work done on the following Monday.   He had blood work done on 2/16 through his PCP's office and was sent to ER due to bump in creatinine to 3.1 and K of 6.0.  Hospital Course:  ARF on CKD w/ Metabolic acidosis  - improved markedly with IVF - SP tube replaced and irrigated as it  is suspected that he had mucous plugging  - baseline crt reportedly ~2.0 - crt has nearly returned to his baseline/better than baseline at this time  - follow serum bicarb w/ improved renal function   SIRS  - at admit WBC 21.5 + HR 95 = SIRS  - no source of infection identified, therefore not sepsis  - SIRS physiology essentially resolved at time of d/c  - d/c abx as there was no objective evidence of infection, and urine findings could be otherwise explained   HTN  - BP was initially stable off antihypertensives, but climbed w/ volume - resumed hydralazine and well controlled - Norvasc remains on hold at time of d/c    Sinus tachycardia  - d-dimer proved to be elevated - Well's score was marginal at 4 - did not emprically anticoag as pt high risk for bleeding given recent extensive surgery and passing of urologic stent - B LE dopplers w/o evidence of DVT - VQ scan w/o evidence of PE   Bladder cancer  - s/p cystectomy and neobladder formation - passed remaining stent 2/18 - Urology followed - no procedures this admit - pt has been advised that he needs to assure his bladder is draining consistently, and to alert his Urologist immediately if this does not occur   DM  - CBG variable th/o hospital stay - resume oral meds at d/c as pt will be resuming his home diet    Hyperkalemia  - due to ARF - resolved     Medication List  STOP taking these medications       amLODipine 10 MG tablet  Commonly known as:  NORVASC      TAKE these medications       citalopram 40 MG tablet  Commonly known as:  CELEXA  Take 40 mg by mouth daily.     clotrimazole 1 % cream  Commonly known as:  LOTRIMIN  Apply 1 application topically 2 (two) times daily as needed (yeast infection).     famotidine 20 MG tablet  Commonly known as:  PEPCID  Take 20 mg by mouth daily.     glimepiride 2 MG tablet  Commonly known as:  AMARYL  Take 1 tablet (2 mg total) by mouth daily with breakfast.      hydrALAZINE 10 MG tablet  Commonly known as:  APRESOLINE  Take 10 mg by mouth 3 (three) times daily.     levofloxacin 750 MG tablet  Commonly known as:  LEVAQUIN  Take 1 tablet (750 mg total) by mouth daily. X 14 DAYS     nitrofurantoin 100 MG capsule  Commonly known as:  MACRODANTIN  Take 1 capsule (100 mg total) by mouth daily. Please HOLD nitrofurantoin while you are on levaquin antibiotic     oxyCODONE-acetaminophen 5-325 MG per tablet  Commonly known as:  PERCOCET/ROXICET  Take 1 tablet by mouth every 6 (six) hours as needed for moderate pain or severe pain.       Day of Discharge BP 133/74  Pulse 97  Temp(Src) 97.7 F (36.5 C) (Oral)  Resp 18  Ht 5\' 10"  (1.778 m)  Wt 89.4 kg (197 lb 1.5 oz)  BMI 28.28 kg/m2  SpO2 100%  Physical Exam: General: No acute respiratory distress Lungs: Clear to auscultation bilaterally without wheezes or crackles Cardiovascular: Regular rate and rhythm without murmur gallop or rub normal S1 and S2 Abdomen: Nontender, nondistended, soft, bowel sounds positive, no rebound, no ascites, no appreciable mass GU:  Suprapubic tube draining clear yellow urine  Extremities: No significant cyanosis, clubbing, or edema bilateral lower extremities  BASIC METABOLIC PANEL     Status: Abnormal   Collection Time    05/25/13  2:39 AM      Result Value Ref Range   Sodium 138  137 - 147 mEq/L   Potassium 4.7  3.7 - 5.3 mEq/L   Chloride 106  96 - 112 mEq/L   CO2 17 (*) 19 - 32 mEq/L   Glucose, Bld 111 (*) 70 - 99 mg/dL   BUN 41 (*) 6 - 23 mg/dL   Creatinine, Ser 1.53 (*) 0.50 - 1.35 mg/dL   Calcium 9.8  8.4 - 10.5 mg/dL   GFR calc non Af Amer 50 (*) >90 mL/min   GFR calc Af Amer 58 (*) >90 mL/min   Time spent in discharge (includes decision making & examination of pt): >30 minutes  05/25/2013, 11:56 AM   Cherene Altes, MD [facility] Office  [phone] Pager [phone]  On-Call/Text Page:      [email or web address]      password  [facility]

## 2013-05-25 NOTE — Discharge Instructions (Signed)
Kidney Disease, Adult The kidneys are two organs that lie on either side of the spine between the middle of the back and the front of the abdomen. The kidneys:   Remove wastes and extra water from the blood.   Produce important hormones. These regulate blood pressure, help keep bones strong, and help create red blood cells.   Balance the fluids and chemicals in the blood and tissues. Kidney disease occurs when the kidneys are damaged. Kidney damage may be sudden (acute) or develop over a long period (chronic). A small amount of damage may not cause problems, but a large amount of damage may make it difficult or impossible for the kidneys to work the way they should. Early detection and treatment of kidney disease may prevent kidney damage from becoming permanent or getting worse. Some kidney diseases are curable, but most are not. Many people with kidney disease are able to control the disease and live a normal life.  TYPES OF KIDNEY DISEASE  Acute kidney injury.Acute kidney injury occurs when there is sudden damage to the kidneys.  Chronic kidney disease. Chronic kidney disease occurs when the kidneys are damaged over a long period.  End-stage kidney disease. End-stage kidney disease occurs when the kidneys are so damaged that they stop working. In end-stage kidney disease, the kidneys cannot get better. CAUSES Any condition, disease, or event that damages the kidneys may cause kidney disease. Acute kidney injury.  A problem with blood flow to the kidneys. This may be caused by:   Blood loss.   Heart disease.   Severe burns.   Liver disease.  Direct damage to the kidneys. This may be caused by:  Some medicines.   A kidney infection.   Poisoning or consuming toxic substances.   A surgical wound.   A blow to the kidney area.   A problem with urine flow. This may be caused by:   Cancer.   Kidney stones.   An enlarged prostate. Chronic kidney disease. The  most common causes of chronic kidney disease are diabetes and high blood pressure (hypertension). Chronic kidney disease may also be caused by:   Diseases that cause the filtering units of the kidneys to become inflamed.   Diseases that affect the immune system.   Genetic diseases.   Medicines that damage the kidneys, such as anti-inflammatory medicines.  Poisoning or exposure to toxic substances.   A reoccurring kidney or urinary infection.   A problem with urine flow. This may be caused by:  Cancer.   Kidney stones.   An enlarged prostate in males. End-stage kidney disease. This kidney disease usually occurs when a chronic kidney disease gets worse. It may also occur after acute kidney injury.  SYMPTOMS   Swelling (edema) of the legs, ankles, or feet.   Tiredness (lethargy).   Nausea or vomiting.   Confusion.   Problems with urination, such as:   Painful or burning feeling during urination.   Decreased urine production.  Bloody urine.   Frequent urination, especially at night.  Hypertension.  Muscle twitches and cramps.   Shortness of breath.   Persistent itchiness.   Loss of appetite.  Metallic taste in the mouth.   Weakness.   Seizures.   Chest pain or pressure.   Trouble sleeping.   Headaches.   Abnormally dark or light skin.   Numbness in the hands or feet.   Easy bruising.   Frequent hiccups.   Menstruation stops. Sometimes, no symptoms are present. DIAGNOSIS  Kidney  mouth.    Weakness.    Seizures.    Chest pain or pressure.    Trouble sleeping.    Headaches.    Abnormally dark or light skin.    Numbness in the hands or feet.    Easy bruising.    Frequent hiccups.    Menstruation stops.  Sometimes, no symptoms are present.  DIAGNOSIS   Kidney disease may be detected and diagnosed by tests, including blood, urine, imaging, or kidney biopsy tests.   TREATMENT   Acute kidney injury. Treatment of acute kidney injury varies depending on the cause and severity of the kidney damage. In mild cases, no treatment may be needed. The kidneys may heal on their own. If acute kidney injury is more severe, your caregiver will treat the cause of the kidney damage, help the kidneys heal, and  prevent complications from occurring. Severe cases may require a procedure to remove toxic wastes from the body (dialysis) or surgery to repair kidney damage. Surgery may involve:    Repair of a torn kidney.    Removal of an obstruction.   Most of the time, you will need to stay overnight at the hospital.   Chronic kidney disease. Most chronic kidney diseases cannot be cured. Treatment usually involves relieving symptoms and preventing or slowing the progression of the disease. Treatment may include:    A special diet. You may need to avoid alcohol and foods that:    Have added salt.    Are high in potassium.    Are high in protein.    Medicines. These may:    Lower blood pressure.    Relieve anemia.    Relieve swelling.    Protect the bones.   End-stage kidney disease. End-stage kidney disease is life-threatening and must be treated immediately. There are two treatments for end-stage kidney disease:    Dialysis.    Receiving a new kidney (kidney transplant).  Both of these treatments have serious risks and consequences. In addition to having dialysis or a kidney transplant, you may need to take medicines to control hypertension and cholesterol and to decrease phosphorus levels in your blood.  LENGTH OF ILLNESS   Acute kidney injury.The length of this disease varies greatly from person to person. Exactly how long it lasts depends on the cause of the kidney damage. Acute kidney injury may develop into chronic kidney disease or end-stage kidney disease.   Chronic kidney disease. This disease usually lasts a lifetime. Chronic kidney disease may worsen over time to become end-stage kidney disease. The time it takes for end-stage kidney disease to develop varies from person to person.   End-stage kidney disease. This disease lasts until a kidney transplant is performed.  PREVENTION   Kidney disease can sometimes be prevented. If you have diabetes, hypertension, or any other condition that may  lead to kidney disease, you should try to prevent kidney disease with:    An appropriate diet.   Medicine.   Lifestyle changes.  FOR MORE INFORMATION   American Association of Kidney Patients: www.aakp.org   National Kidney Foundation: www.kidney.org   American Kidney Fund: www.akfinc.org   Life Options Rehabilitation Program: www.lifeoptions.org and www.kidneyschool.org   Document Released: 03/22/2005 Document Revised: 03/08/2012 Document Reviewed: 11/19/2011  Exit[employer] Patient Information 2014 [employer].

## 2013-05-25 NOTE — Progress Notes (Signed)
Discharge instructions reviewed with the patient and his wife. Reviewed his Discharge medications and emphasized that he was to stop taking his Amlodipine.  Reviewed his follow up appointment on Monday. Explained how to set up his on-line chart at [email or web address].  Both voice understanding to teaching.  Patient elected to ambulate to the door.  Home via [place] with his wife driving.

## 2013-05-28 LAB — CULTURE, BLOOD (ROUTINE X 2)
CULTURE: NO GROWTH
Culture: NO GROWTH

## 2013-05-30 ENCOUNTER — Encounter (HOSPITAL_COMMUNITY): Payer: Self-pay | Admitting: Emergency Medicine

## 2013-05-30 ENCOUNTER — Emergency Department (HOSPITAL_COMMUNITY)
Admission: EM | Admit: 2013-05-30 | Discharge: 2013-05-30 | Disposition: A | Discharge status: Home / Self Care | Payer: BC Managed Care – PPO | Attending: Emergency Medicine | Admitting: Emergency Medicine

## 2013-05-30 DIAGNOSIS — F411 Generalized anxiety disorder: Secondary | ICD-10-CM | POA: Insufficient documentation

## 2013-05-30 DIAGNOSIS — R6889 Other general symptoms and signs: Secondary | ICD-10-CM | POA: Insufficient documentation

## 2013-05-30 DIAGNOSIS — Z8551 Personal history of malignant neoplasm of bladder: Secondary | ICD-10-CM | POA: Insufficient documentation

## 2013-05-30 DIAGNOSIS — R899 Unspecified abnormal finding in specimens from other organs, systems and tissues: Secondary | ICD-10-CM

## 2013-05-30 DIAGNOSIS — Z792 Long term (current) use of antibiotics: Secondary | ICD-10-CM | POA: Insufficient documentation

## 2013-05-30 DIAGNOSIS — F329 Major depressive disorder, single episode, unspecified: Secondary | ICD-10-CM | POA: Insufficient documentation

## 2013-05-30 DIAGNOSIS — Z87891 Personal history of nicotine dependence: Secondary | ICD-10-CM | POA: Insufficient documentation

## 2013-05-30 DIAGNOSIS — E119 Type 2 diabetes mellitus without complications: Secondary | ICD-10-CM | POA: Insufficient documentation

## 2013-05-30 DIAGNOSIS — I10 Essential (primary) hypertension: Secondary | ICD-10-CM | POA: Insufficient documentation

## 2013-05-30 DIAGNOSIS — Z79899 Other long term (current) drug therapy: Secondary | ICD-10-CM | POA: Insufficient documentation

## 2013-05-30 DIAGNOSIS — K219 Gastro-esophageal reflux disease without esophagitis: Secondary | ICD-10-CM | POA: Insufficient documentation

## 2013-05-30 DIAGNOSIS — F3289 Other specified depressive episodes: Secondary | ICD-10-CM | POA: Insufficient documentation

## 2013-05-30 LAB — BASIC METABOLIC PANEL
BUN: 50 mg/dL — AB (ref 6–23)
CO2: 14 mEq/L — ABNORMAL LOW (ref 19–32)
CREATININE: 1.57 mg/dL — AB (ref 0.50–1.35)
Calcium: 10.6 mg/dL — ABNORMAL HIGH (ref 8.4–10.5)
Chloride: 103 mEq/L (ref 96–112)
GFR calc non Af Amer: 48 mL/min — ABNORMAL LOW (ref >90)
GFR, EST AFRICAN AMERICAN: 56 mL/min — AB (ref >90)
Glucose, Bld: 85 mg/dL (ref 70–99)
POTASSIUM: 5 meq/L (ref 3.7–5.3)
Sodium: 133 mEq/L — ABNORMAL LOW (ref 137–147)

## 2013-05-30 LAB — I-STAT TROPONIN, ED: Troponin i, poc: 0.01 ng/mL (ref 0.00–0.08)

## 2013-05-30 LAB — I-STAT CHEM 8, ED
BUN: 55 mg/dL — ABNORMAL HIGH (ref 6–23)
CREATININE: 1.8 mg/dL — AB (ref 0.50–1.35)
Calcium, Ion: 1.56 mmol/L — ABNORMAL HIGH (ref 1.12–1.23)
Chloride: 111 mEq/L (ref 96–112)
GLUCOSE: 86 mg/dL (ref 70–99)
HCT: 34 % — ABNORMAL LOW (ref 39.0–52.0)
Hemoglobin: 11.6 g/dL — ABNORMAL LOW (ref 13.0–17.0)
Potassium: 4.8 mEq/L (ref 3.7–5.3)
SODIUM: 136 meq/L — AB (ref 137–147)
TCO2: 16 mmol/L (ref 0–100)

## 2013-05-30 LAB — CBC
HEMATOCRIT: 31.4 % — AB (ref 39.0–52.0)
Hemoglobin: 10.9 g/dL — ABNORMAL LOW (ref 13.0–17.0)
MCH: 29 pg (ref 26.0–34.0)
MCHC: 34.7 g/dL (ref 30.0–36.0)
MCV: 83.5 fL (ref 78.0–100.0)
Platelets: 363 10*3/uL (ref 150–400)
RBC: 3.76 MIL/uL — ABNORMAL LOW (ref 4.22–5.81)
RDW: 13.7 % (ref 11.5–15.5)
WBC: 12.9 10*3/uL — ABNORMAL HIGH (ref 4.0–10.5)

## 2013-05-30 NOTE — ED Notes (Signed)
MD at bedside. 

## 2013-05-30 NOTE — ED Provider Notes (Signed)
CSN: [id number]     Arrival date & time 05/30/13  1745 History   First MD Initiated Contact with Patient 05/30/13 1949     Chief Complaint  Patient presents with  . Abnormal Lab    HPI Patient presents to the emergency department with concern of hyperkalemia.  Patient had outpatient labs performed due to recent history of bladder answer.  Patient states that the bladder cancer has been appropriately treated, has a prosthetic bladder in place with suprapubic catheter, which continues to drain urine.  He denies no fevers, chills, chest pain, dyspnea or any other changes from baseline. Labs today demonstrated hyperkalemia, and he was referred here for evaluation.     Past Medical History  Diagnosis Date  . Gout     recent flair -bilateral feet--  STABLE  . Frequency of urination   . Urgency incontinence   . Nocturia   . Diabetes mellitus type 2, diet-controlled PT STOPPED TAKING METFORMIN--  HAS BEEN WATCHING DIET, EXERCISING AND LOSING WT  . Bladder cancer 2013  . Complication of anesthesia     agitation w/awakening in 9/13,OK 01/24/12  . Hypertension   . Anxiety   . Depression   . GERD (gastroesophageal reflux disease)    Past Surgical History  Procedure Laterality Date  . [place] artery repair  2009    rt -trauma /thrombectomy,repair  . Transurethral resection of bladder tumor  12/15/2011    Procedure: TRANSURETHRAL RESECTION OF BLADDER TUMOR (TURBT);  Surgeon: Molli Hazard, MD;  Location: [facility];  Service: Urology;  Laterality: N/A;  2 HRS Procedure reads: cysto, TURBT , Bilateral retrograde pyelograms, poss bilateral ureteroscopy with biopsy and ureter stent placement   . Lumbar laminectomy  06-02-2005    W/ RESECTION NERVE ROOT  L4  -  L5  . Transurethral resection of bladder tumor  01/24/2012    Procedure: TRANSURETHRAL RESECTION OF BLADDER TUMOR (TURBT);  Surgeon: Molli Hazard, MD;  Location: [facility];  Service:  Urology;  Laterality: N/A;  90 MIN ADD LEFT URETER BIOPSY TO PROCEDURE    . Cystoscopy with urethral dilatation  01/24/2012    Procedure: CYSTOSCOPY WITH URETHRAL DILATATION;  Surgeon: Molli Hazard, MD;  Location: [facility];  Service: Urology;  Laterality: N/A;  . Transurethral resection of bladder tumor N/A 07/10/2012    Procedure: TRANSURETHRAL RESECTION OF BLADDER TUMOR (TURBT);  Surgeon: Molli Hazard, MD;  Location: [facility];  Service: Urology;  Laterality: N/A;  . Cystoscopy w/ retrogrades Bilateral 07/10/2012    Procedure: CYSTOSCOPY WITH RETROGRADE PYELOGRAM;  Surgeon: Molli Hazard, MD;  Location: [facility];  Service: Urology;  Laterality: Bilateral;   Family History  Problem Relation Age of Onset  . Diabetes Mellitus II Other   . Hypertension Other   . Bladder Cancer Neg Hx    History  Substance Use Topics  . Smoking status: Former Smoker -- 1.00 packs/day for 16 years    Types: Cigarettes    Quit date: 01/20/1992  . Smokeless tobacco: Former Systems developer    Types: [place] date: 01/20/1992  . Alcohol Use: No    Review of Systems  Constitutional:       Per HPI, otherwise negative  HENT:       Per HPI, otherwise negative  Respiratory:       Per HPI, otherwise negative  Cardiovascular:       Per HPI, otherwise negative  Gastrointestinal: Negative for vomiting.  Endocrine:       Negative aside from HPI  Genitourinary:       Neg aside from HPI   Musculoskeletal:       Per HPI, otherwise negative  Skin: Negative.   Neurological: Negative for syncope.      Allergies  Lisinopril  Home Medications   Current Outpatient Rx  Name  Route  Sig  Dispense  Refill  . citalopram (CELEXA) 40 MG tablet   Oral   Take 40 mg by mouth daily.         . clotrimazole (LOTRIMIN) 1 % cream   Topical   Apply 1 application topically 2 (two) times daily as needed (yeast infection).         .  famotidine (PEPCID) 20 MG tablet   Oral   Take 20 mg by mouth daily.         Marland Kitchen glimepiride (AMARYL) 2 MG tablet   Oral   Take 1 tablet (2 mg total) by mouth daily with breakfast.   30 tablet   3   . hydrALAZINE (APRESOLINE) 10 MG tablet   Oral   Take 10 mg by mouth 3 (three) times daily.         Marland Kitchen levofloxacin (LEVAQUIN) 750 MG tablet   Oral   Take 1 tablet (750 mg total) by mouth daily. X 14 DAYS   14 tablet   0   . nitrofurantoin (MACRODANTIN) 100 MG capsule   Oral   Take 1 capsule (100 mg total) by mouth daily. Please HOLD nitrofurantoin while you are on levaquin antibiotic         . oxyCODONE-acetaminophen (PERCOCET/ROXICET) 5-325 MG per tablet   Oral   Take 1 tablet by mouth every 6 (six) hours as needed for moderate pain or severe pain.          BP 112/69  Pulse 93  Temp(Src) 97.5 F (36.4 C) (Oral)  Resp 17  Ht 5\' 10"  (1.778 m)  Wt 197 lb (89.359 kg)  BMI 28.27 kg/m2  SpO2 100% Physical Exam  Constitutional: He is oriented to person, place, and time. He appears well-developed and well-nourished. No distress.  HENT:  Head: Normocephalic and atraumatic.  Eyes: Right eye exhibits no discharge. Left eye exhibits no discharge.  Neck: No tracheal deviation present.  Pulmonary/Chest: No stridor. No respiratory distress.  Neurological: He is alert and oriented to person, place, and time. No cranial nerve deficit. Coordination normal.  Skin: Skin is warm and dry. He is not diaphoretic.  Psychiatric: He has a normal mood and affect.    ED Course  Procedures (including critical care time) Labs Review Labs Reviewed  CBC - Abnormal; Notable for the following:    WBC 12.9 (*)    RBC 3.76 (*)    Hemoglobin 10.9 (*)    HCT 31.4 (*)    All other components within normal limits  BASIC METABOLIC PANEL - Abnormal; Notable for the following:    Sodium 133 (*)    CO2 14 (*)    BUN 50 (*)    Creatinine, Ser 1.57 (*)    Calcium 10.6 (*)    GFR calc non Af Amer  48 (*)    GFR calc Af Amer 56 (*)    All other components within normal limits  I-STAT CHEM 8, ED - Abnormal; Notable for the following:    Sodium 136 (*)    BUN 55 (*)  Creatinine, Ser 1.80 (*)    Calcium, Ion 1.56 (*)    Hemoglobin 11.6 (*)    HCT 34.0 (*)    All other components within normal limits  I-STAT TROPOININ, ED     EKG Interpretation    Date/Time:  Wednesday May 30 2013 18:15:11 EST Ventricular Rate:  96 PR Interval:  152 QRS Duration: 92 QT Interval:  338 QTC Calculation: 427 R Axis:   57 Text Interpretation:  Normal sinus rhythm Normal ECG Normal sinus rhythm No significant change since last tracing Normal ECG Confirmed by Carmin Muskrat  MD [phone]) on 05/30/2013 7:48:54 PM            MDM   Final diagnoses:  Abnormal laboratory test result    Patient presents to 2 concerns of abnormal lab value done at another facility.  On exam he is awake, alert and hemodynamically stable, with no notable abnormality, and a reassuring EKG.  No additional testing indicated, and he was discharged in stable condition.  He will have repeat labs performed in the 4 days, as scheduled.    Carmin Muskrat, MD 05/30/13 [phone]

## 2013-05-30 NOTE — ED Notes (Addendum)
Pt reports came here for high potassium; doesn't know what the level was; reports hx of same; pt denies cp, sob; does report yesterday he felt dizzy and had jaw pain

## 2013-05-30 NOTE — Discharge Instructions (Signed)
As discussed, your evaluation today has been largely reassuring.  But, it is important that you monitor your condition carefully, and do not hesitate to return to the ED if you develop new, or concerning changes in your condition. ? ?Otherwise, please follow-up with your physician for appropriate ongoing care. ? ?

## 2013-06-25 ENCOUNTER — Encounter (HOSPITAL_COMMUNITY): Payer: Self-pay | Admitting: Emergency Medicine

## 2013-06-25 ENCOUNTER — Emergency Department (HOSPITAL_COMMUNITY): Payer: BC Managed Care – PPO

## 2013-06-25 ENCOUNTER — Inpatient Hospital Stay (HOSPITAL_COMMUNITY)
Admission: EM | Admit: 2013-06-25 | Discharge: 2013-06-28 | DRG: 641 | Disposition: A | Discharge status: Home / Self Care | Payer: BC Managed Care – PPO | Attending: Internal Medicine | Admitting: Internal Medicine

## 2013-06-25 DIAGNOSIS — E119 Type 2 diabetes mellitus without complications: Secondary | ICD-10-CM | POA: Diagnosis present

## 2013-06-25 DIAGNOSIS — N189 Chronic kidney disease, unspecified: Secondary | ICD-10-CM | POA: Diagnosis present

## 2013-06-25 DIAGNOSIS — E872 Acidosis, unspecified: Principal | ICD-10-CM | POA: Diagnosis present

## 2013-06-25 DIAGNOSIS — I129 Hypertensive chronic kidney disease with stage 1 through stage 4 chronic kidney disease, or unspecified chronic kidney disease: Secondary | ICD-10-CM | POA: Diagnosis present

## 2013-06-25 DIAGNOSIS — R634 Abnormal weight loss: Secondary | ICD-10-CM | POA: Diagnosis present

## 2013-06-25 DIAGNOSIS — K219 Gastro-esophageal reflux disease without esophagitis: Secondary | ICD-10-CM | POA: Diagnosis present

## 2013-06-25 DIAGNOSIS — I10 Essential (primary) hypertension: Secondary | ICD-10-CM

## 2013-06-25 DIAGNOSIS — Z888 Allergy status to other drugs, medicaments and biological substances status: Secondary | ICD-10-CM

## 2013-06-25 DIAGNOSIS — K3184 Gastroparesis: Secondary | ICD-10-CM | POA: Diagnosis present

## 2013-06-25 DIAGNOSIS — D6489 Other specified anemias: Secondary | ICD-10-CM | POA: Diagnosis present

## 2013-06-25 DIAGNOSIS — Z79899 Other long term (current) drug therapy: Secondary | ICD-10-CM

## 2013-06-25 DIAGNOSIS — Z87891 Personal history of nicotine dependence: Secondary | ICD-10-CM

## 2013-06-25 DIAGNOSIS — N179 Acute kidney failure, unspecified: Secondary | ICD-10-CM | POA: Diagnosis present

## 2013-06-25 DIAGNOSIS — E876 Hypokalemia: Secondary | ICD-10-CM | POA: Diagnosis present

## 2013-06-25 DIAGNOSIS — F3289 Other specified depressive episodes: Secondary | ICD-10-CM | POA: Diagnosis present

## 2013-06-25 DIAGNOSIS — K209 Esophagitis, unspecified without bleeding: Secondary | ICD-10-CM | POA: Diagnosis present

## 2013-06-25 DIAGNOSIS — F329 Major depressive disorder, single episode, unspecified: Secondary | ICD-10-CM | POA: Diagnosis present

## 2013-06-25 DIAGNOSIS — K2990 Gastroduodenitis, unspecified, without bleeding: Secondary | ICD-10-CM

## 2013-06-25 DIAGNOSIS — N390 Urinary tract infection, site not specified: Secondary | ICD-10-CM | POA: Diagnosis present

## 2013-06-25 DIAGNOSIS — R112 Nausea with vomiting, unspecified: Secondary | ICD-10-CM

## 2013-06-25 DIAGNOSIS — K279 Peptic ulcer, site unspecified, unspecified as acute or chronic, without hemorrhage or perforation: Secondary | ICD-10-CM | POA: Diagnosis present

## 2013-06-25 DIAGNOSIS — D649 Anemia, unspecified: Secondary | ICD-10-CM | POA: Diagnosis present

## 2013-06-25 DIAGNOSIS — K2970 Gastritis, unspecified, without bleeding: Secondary | ICD-10-CM | POA: Diagnosis present

## 2013-06-25 DIAGNOSIS — Z833 Family history of diabetes mellitus: Secondary | ICD-10-CM

## 2013-06-25 DIAGNOSIS — E860 Dehydration: Secondary | ICD-10-CM | POA: Diagnosis present

## 2013-06-25 DIAGNOSIS — F411 Generalized anxiety disorder: Secondary | ICD-10-CM | POA: Diagnosis present

## 2013-06-25 DIAGNOSIS — R1310 Dysphagia, unspecified: Secondary | ICD-10-CM | POA: Diagnosis present

## 2013-06-25 DIAGNOSIS — Z906 Acquired absence of other parts of urinary tract: Secondary | ICD-10-CM

## 2013-06-25 DIAGNOSIS — C679 Malignant neoplasm of bladder, unspecified: Secondary | ICD-10-CM | POA: Diagnosis present

## 2013-06-25 DIAGNOSIS — M109 Gout, unspecified: Secondary | ICD-10-CM | POA: Diagnosis present

## 2013-06-25 LAB — COMPREHENSIVE METABOLIC PANEL
ALBUMIN: 3.2 g/dL — AB (ref 3.5–5.2)
ALT: 14 U/L (ref 0–53)
AST: 11 U/L (ref 0–37)
Alkaline Phosphatase: 73 U/L (ref 39–117)
BUN: 68 mg/dL — ABNORMAL HIGH (ref 6–23)
CALCIUM: 12.6 mg/dL — AB (ref 8.4–10.5)
CO2: 9 mEq/L — CL (ref 19–32)
Chloride: 103 mEq/L (ref 96–112)
Creatinine, Ser: 2.54 mg/dL — ABNORMAL HIGH (ref 0.50–1.35)
GFR calc Af Amer: 31 mL/min — ABNORMAL LOW (ref >90)
GFR calc non Af Amer: 27 mL/min — ABNORMAL LOW (ref >90)
Glucose, Bld: 106 mg/dL — ABNORMAL HIGH (ref 70–99)
Potassium: 4.1 mEq/L (ref 3.7–5.3)
SODIUM: 128 meq/L — AB (ref 137–147)
TOTAL PROTEIN: 8.3 g/dL (ref 6.0–8.3)
Total Bilirubin: <0.2 mg/dL — ABNORMAL LOW (ref 0.3–1.2)

## 2013-06-25 LAB — URINALYSIS, ROUTINE W REFLEX MICROSCOPIC
GLUCOSE, UA: NEGATIVE mg/dL
KETONES UR: NEGATIVE mg/dL
Nitrite: NEGATIVE
PH: 7.5 (ref 5.0–8.0)
Protein, ur: 100 mg/dL — AB
SPECIFIC GRAVITY, URINE: 1.011 (ref 1.005–1.030)
Urobilinogen, UA: 0.2 mg/dL (ref 0.0–1.0)

## 2013-06-25 LAB — CBC WITH DIFFERENTIAL/PLATELET
BASOS PCT: 0 % (ref 0–1)
Basophils Absolute: 0 10*3/uL (ref 0.0–0.1)
EOS ABS: 0.1 10*3/uL (ref 0.0–0.7)
Eosinophils Relative: 1 % (ref 0–5)
HCT: 32.3 % — ABNORMAL LOW (ref 39.0–52.0)
Hemoglobin: 11 g/dL — ABNORMAL LOW (ref 13.0–17.0)
Lymphocytes Relative: 14 % (ref 12–46)
Lymphs Abs: 2 10*3/uL (ref 0.7–4.0)
MCH: 27.8 pg (ref 26.0–34.0)
MCHC: 34.1 g/dL (ref 30.0–36.0)
MCV: 81.8 fL (ref 78.0–100.0)
Monocytes Absolute: 0.8 10*3/uL (ref 0.1–1.0)
Monocytes Relative: 5 % (ref 3–12)
Neutro Abs: 11.4 10*3/uL — ABNORMAL HIGH (ref 1.7–7.7)
Neutrophils Relative %: 80 % — ABNORMAL HIGH (ref 43–77)
PLATELETS: 424 10*3/uL — AB (ref 150–400)
RBC: 3.95 MIL/uL — ABNORMAL LOW (ref 4.22–5.81)
RDW: 14.3 % (ref 11.5–15.5)
WBC: 14.3 10*3/uL — ABNORMAL HIGH (ref 4.0–10.5)

## 2013-06-25 LAB — BLOOD GAS, ARTERIAL
Acid-base deficit: 19.5 mmol/L — ABNORMAL HIGH (ref 0.0–2.0)
Bicarbonate: 7.9 mEq/L — ABNORMAL LOW (ref 20.0–24.0)
Drawn by: 31288
FIO2: 0.21 %
O2 Saturation: 97.4 %
PATIENT TEMPERATURE: 98
PO2 ART: 102 mmHg — AB (ref 80.0–100.0)
TCO2: 7.8 mmol/L (ref 0–100)
pCO2 arterial: 23.3 mmHg — ABNORMAL LOW (ref 35.0–45.0)
pH, Arterial: 7.156 — CL (ref 7.350–7.450)

## 2013-06-25 LAB — I-STAT CG4 LACTIC ACID, ED: Lactic Acid, Venous: 0.47 mmol/L — ABNORMAL LOW (ref 0.5–2.2)

## 2013-06-25 LAB — LIPASE, BLOOD: Lipase: 96 U/L — ABNORMAL HIGH (ref 11–59)

## 2013-06-25 LAB — URINE MICROSCOPIC-ADD ON

## 2013-06-25 MED ORDER — STERILE WATER FOR INJECTION IV SOLN
INTRAVENOUS | Status: DC
  Administered 2013-06-26 – 2013-06-27 (×4): via INTRAVENOUS
  Filled 2013-06-25 (×9): qty 850

## 2013-06-25 MED ORDER — HEPARIN SODIUM (PORCINE) 5000 UNIT/ML IJ SOLN
5000.0000 [IU] | Freq: Three times a day (TID) | INTRAMUSCULAR | Status: DC
  Administered 2013-06-26 – 2013-06-28 (×7): 5000 [IU] via SUBCUTANEOUS
  Filled 2013-06-25 (×11): qty 1

## 2013-06-25 MED ORDER — DEXTROSE 5 % IV SOLN
1.0000 g | INTRAVENOUS | Status: DC
  Administered 2013-06-25 – 2013-06-27 (×3): 1 g via INTRAVENOUS
  Filled 2013-06-25 (×4): qty 10

## 2013-06-25 MED ORDER — CITALOPRAM HYDROBROMIDE 40 MG PO TABS
40.0000 mg | ORAL_TABLET | Freq: Every day | ORAL | Status: DC
  Administered 2013-06-26 – 2013-06-28 (×3): 40 mg via ORAL
  Filled 2013-06-25 (×4): qty 1

## 2013-06-25 MED ORDER — SODIUM CHLORIDE 0.9 % IJ SOLN
3.0000 mL | Freq: Two times a day (BID) | INTRAMUSCULAR | Status: DC
  Administered 2013-06-26 – 2013-06-28 (×3): 3 mL via INTRAVENOUS

## 2013-06-25 MED ORDER — ONDANSETRON HCL 4 MG/2ML IJ SOLN: 4.0000 mg | Freq: Four times a day (QID) | INTRAMUSCULAR | Status: DC | PRN

## 2013-06-25 MED ORDER — SODIUM CHLORIDE 0.9 % IV SOLN
1000.0000 mL | Freq: Once | INTRAVENOUS | Status: AC
  Administered 2013-06-25: 1000 mL via INTRAVENOUS

## 2013-06-25 MED ORDER — PANTOPRAZOLE SODIUM 40 MG PO TBEC
40.0000 mg | DELAYED_RELEASE_TABLET | Freq: Every day | ORAL | Status: DC
  Administered 2013-06-26: 40 mg via ORAL
  Filled 2013-06-25: qty 1

## 2013-06-25 MED ORDER — SODIUM CHLORIDE 0.9 % IV SOLN
1000.0000 mL | INTRAVENOUS | Status: DC
  Administered 2013-06-25: 1000 mL via INTRAVENOUS

## 2013-06-25 NOTE — ED Notes (Addendum)
Pt states he has been throwing up and had weakness for past 3 weeks. Pt went to PCP on Friday and had blood drawn. Pt was called today and told he had WBC count of 17,000 and was told to come here. Pt states he was diagnosed with "parasite" on Friday.

## 2013-06-25 NOTE — H&P (Signed)
[facility] History and Physical  [name] [name] [id number] DOB: [birth year] DOA: 06/25/2013  Referring physician: EDP PCP: Florina Ou, MD   Chief Complaint: N/V   HPI: [name] [name] is a 53 y.o. male who presents to the ED with N/V for the past 3 weeks and generalized fatigue.  [facility] and [facility] between 2/16 to 2/20 for ARF and metabolic acidosis that ultimately was improved with IVF apparently, although initially he was believed to have SIRS due to a Klebselia UTI (see discharge summary on 05/25/13 by Dr. Thereasa Solo for a full report).  Prior to that admit the R renal stent was removed, and during that admit he passed the remaining stent on 2/18.  (Regarding why the stents were in place: Patient has long and complex urologic history including radical resection of the bladder for bladder cancer and construction of a neo-bladder performed on 1/22 in [place], stents were put in at that time).  He was feeling better after that admit he says for about 4 days.  4 days after discharge or so he began to slowly but progressively feel worse, develop N/V, and ultimately re-presented to the ED today.  Work up in the ED today demonstrates his creatinine and BUN are back up, and he is acidotic with a NAG metabolic acidosis as seen on his ABG.  Review of Systems: Systems reviewed.  As above, otherwise negative  Past Medical History  Diagnosis Date  . Gout     recent flair -bilateral feet--  STABLE  . Frequency of urination   . Urgency incontinence   . Nocturia   . Diabetes mellitus type 2, diet-controlled PT STOPPED TAKING METFORMIN--  HAS BEEN WATCHING DIET, EXERCISING AND LOSING WT  . Bladder cancer 2013  . Complication of anesthesia     agitation w/awakening in 9/13,OK 01/24/12  . Hypertension   . Anxiety   . Depression   . GERD (gastroesophageal reflux disease)    Past Surgical History  Procedure Laterality Date  . Ulner artery repair  2009    rt -trauma  /thrombectomy,repair  . Transurethral resection of bladder tumor  12/15/2011    Procedure: TRANSURETHRAL RESECTION OF BLADDER TUMOR (TURBT);  Surgeon: Molli Hazard, MD;  Location: [facility];  Service: Urology;  Laterality: N/A;  2 HRS Procedure reads: cysto, TURBT , Bilateral retrograde pyelograms, poss bilateral ureteroscopy with biopsy and ureter stent placement   . Lumbar laminectomy  06-02-2005    W/ RESECTION NERVE ROOT  L4  -  L5  . Transurethral resection of bladder tumor  01/24/2012    Procedure: TRANSURETHRAL RESECTION OF BLADDER TUMOR (TURBT);  Surgeon: Molli Hazard, MD;  Location: [facility];  Service: Urology;  Laterality: N/A;  90 MIN ADD LEFT URETER BIOPSY TO PROCEDURE    . Cystoscopy with urethral dilatation  01/24/2012    Procedure: CYSTOSCOPY WITH URETHRAL DILATATION;  Surgeon: Molli Hazard, MD;  Location: [facility];  Service: Urology;  Laterality: N/A;  . Transurethral resection of bladder tumor N/A 07/10/2012    Procedure: TRANSURETHRAL RESECTION OF BLADDER TUMOR (TURBT);  Surgeon: Molli Hazard, MD;  Location: [facility];  Service: Urology;  Laterality: N/A;  . Cystoscopy w/ retrogrades Bilateral 07/10/2012    Procedure: CYSTOSCOPY WITH RETROGRADE PYELOGRAM;  Surgeon: Molli Hazard, MD;  Location: [facility];  Service: Urology;  Laterality: Bilateral;   Social History:  reports that he quit smoking about 21  years ago. His smoking use included Cigarettes. He has a 16 pack-year smoking history. He quit smokeless tobacco use about 21 years ago. His smokeless tobacco use included Chew. He reports that he does not drink alcohol or use illicit drugs.  Allergies  Allergen Reactions  . Lisinopril Cough    Family History  Problem Relation Age of Onset  . Diabetes Mellitus II Other   . Hypertension Other   . Bladder Cancer Neg Hx      Prior to Admission  medications   Medication Sig Start Date End Date Taking? Authorizing Provider  amoxicillin (AMOXIL) 500 MG capsule Take 1,000 mg by mouth 2 (two) times daily. 06/22/13  Yes Historical Provider, MD  citalopram (CELEXA) 40 MG tablet Take 40 mg by mouth daily.   Yes Historical Provider, MD  omeprazole (PRILOSEC) 20 MG capsule Take 20 mg by mouth daily.   Yes Historical Provider, MD  ondansetron (ZOFRAN-ODT) 4 MG disintegrating tablet Take 4 mg by mouth every 8 (eight) hours as needed for nausea or vomiting.   Yes Historical Provider, MD   Physical Exam: Filed Vitals:   06/25/13 1912  BP: 124/69  Pulse: 91  Temp: 97.7 F (36.5 C)  Resp: 16    BP 124/69  Pulse 91  Temp(Src) 97.7 F (36.5 C) (Oral)  Resp 16  SpO2 100%  General Appearance:    Alert, oriented, no distress, appears stated age  Head:    Normocephalic, atraumatic  Eyes:    PERRL, EOMI, sclera non-icteric        Nose:   Nares without drainage or epistaxis. Mucosa, turbinates normal  Throat:   Moist mucous membranes. Oropharynx without erythema or exudate.  Neck:   Supple. No carotid bruits.  No thyromegaly.  No lymphadenopathy.   Back:     No CVA tenderness, no spinal tenderness  Lungs:     Clear to auscultation bilaterally, without wheezes, rhonchi or rales  Chest wall:    No tenderness to palpitation  Heart:    Regular rate and rhythm without murmurs, gallops, rubs  Abdomen:     Soft, non-tender, nondistended, normal bowel sounds, no organomegaly  Genitalia:    deferred  Rectal:    deferred  Extremities:   No clubbing, cyanosis or edema.  Pulses:   2+ and symmetric all extremities  Skin:   Skin color, texture, turgor normal, no rashes or lesions  Lymph nodes:   Cervical, supraclavicular, and axillary nodes normal  Neurologic:   CNII-XII intact. Normal strength, sensation and reflexes      throughout    Labs on Admission:  Basic Metabolic Panel:  Recent Labs Lab 06/25/13 1950  NA 128*  K 4.1  CL 103  CO2 9*   GLUCOSE 106*  BUN 68*  CREATININE 2.54*  CALCIUM 12.6*   Liver Function Tests:  Recent Labs Lab 06/25/13 1950  AST 11  ALT 14  ALKPHOS 73  BILITOT <0.2*  PROT 8.3  ALBUMIN 3.2*    Recent Labs Lab 06/25/13 1950  LIPASE 96*   No results found for this basename: AMMONIA,  in the last 168 hours CBC:  Recent Labs Lab 06/25/13 1950  WBC 14.3*  NEUTROABS 11.4*  HGB 11.0*  HCT 32.3*  MCV 81.8  PLT 424*   Cardiac Enzymes: No results found for this basename: CKTOTAL, CKMB, CKMBINDEX, TROPONINI,  in the last 168 hours  BNP (last 3 results) No results found for this basename: PROBNP,  in the last 8760 hours CBG:  No results found for this basename: GLUCAP,  in the last 168 hours  Radiological Exams on Admission: Dg Abd Acute W/chest  06/25/2013   CLINICAL DATA:  Abdominal pain.  Fatigue.  Bladder cancer.  EXAM: ACUTE ABDOMEN SERIES (ABDOMEN 2 VIEW & CHEST 1 VIEW)  COMPARISON:  DG CHEST 2 VIEW dated 05/24/2013; CT ABD/PELV WO CM dated 05/21/2013  FINDINGS: The lungs appear clear.  Cardiac and mediastinal contours normal.  No pleural effusion identified.  No free intraperitoneal gas. Clips project over the anatomic pelvis. Bowel gas pattern unremarkable. No significant abnormal air-fluid levels. 3 mm right kidney lower pole nonobstructive calculus.  Pars defects are visible at L4.  IMPRESSION: 1. 3 mm right kidney lower pole calculus. 2. 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   Electronically Signed   By: Sherryl Barters M.D.   On: 06/25/2013 20:28    EKG: Independently reviewed.  Assessment/Plan Active Problems:   Metabolic acidosis with normal anion gap and bicarbonate losses   UTI (lower urinary tract infection)   Acute-on-chronic kidney injury   1. NAG metabolic acidosis - no hyperkalemia at this time (unlike during his [facility] admit), renal ultrasound pending to see if he has obstructive uropathy again and needs stent replacement, nephrology called by EDP, patient started on sodium bicarb  gtt at their request. 2. UTI - patient started on rocephin, cultures pending, patient only has 1 sirs criteria, with no anion gap his acidosis is not due to lactic acid, do not think sepsis is causing his symptoms but rather the NAG metabolic acidosis. 3. AKI on CKD - hydration with bicarb gtt, am suspicious that renal ultrasound may show obstructive uropathy as the source thus neatly explaining both the timing of onset of his symptoms (after stents were out), as well as the RTA.    Code Status: Full Code  Family Communication: No family in room Disposition Plan: Admit to inpatient   Time spent: 70 min  GARDNER, JARED M. [facility] Pager [phone]  If 7AM-7PM, please contact the day team taking care of the patient [email or web address] Password [facility] 06/25/2013, 10:12 PM

## 2013-06-25 NOTE — ED Provider Notes (Signed)
CSN: [id number]     Arrival date & time 06/25/13  1826 History   First MD Initiated Contact with Patient 06/25/13 1916     Chief Complaint  Patient presents with  . Abnormal Lab  . Fatigue     (Consider location/radiation/quality/duration/timing/severity/associated sxs/prior Treatment) The history is provided by the patient, the spouse and medical records. No language interpreter was used.    [name] [name] is a 53 year old male with history of bladder cancer that was definitively treated with a "neobladder" replacement 2 months ago (04/26/2013). Pt in ED tonight because his PCP reported an elevated WBC of 17,000 and is currently complaining of generalized weakness, nausea, and vomiting that has occurred since four days post-surgery. He states that he could sleep all day, and always feels tired. Pt had office visit with PCP two days ago, at which time he was treated for H.Pylori and nausea with Amoxicillin 1000 mg BID, Zofran, and omeprazole. Patient stated the nausea was somewhat controlled over the weekend, with no episodes of vomiting until this morning. Admits dysuria x 2 days and constipation with last BM 4 days ago. Denies hematuria, flank pain, hematemesis, abdominal pain, fever, night sweats, chills, diarrhea, hematochezia. He was recently admitted to [facility] on two occasions, once for renal failure and the most previous for tachycardia and hyperkalemia. Pt has been told by renal specialist that his renal function has been improving.   Past Medical History  Diagnosis Date  . Gout     recent flair -bilateral feet--  STABLE  . Frequency of urination   . Urgency incontinence   . Nocturia   . Diabetes mellitus type 2, diet-controlled PT STOPPED TAKING METFORMIN--  HAS BEEN WATCHING DIET, EXERCISING AND LOSING WT  . Bladder cancer 2013  . Complication of anesthesia     agitation w/awakening in 9/13,OK 01/24/12  . Hypertension   . Anxiety   . Depression   . GERD (gastroesophageal  reflux disease)    Past Surgical History  Procedure Laterality Date  . Ulner artery repair  2009    rt -trauma /thrombectomy,repair  . Transurethral resection of bladder tumor  12/15/2011    Procedure: TRANSURETHRAL RESECTION OF BLADDER TUMOR (TURBT);  Surgeon: Molli Hazard, MD;  Location: [facility];  Service: Urology;  Laterality: N/A;  2 HRS Procedure reads: cysto, TURBT , Bilateral retrograde pyelograms, poss bilateral ureteroscopy with biopsy and ureter stent placement   . Lumbar laminectomy  06-02-2005    W/ RESECTION NERVE ROOT  L4  -  L5  . Transurethral resection of bladder tumor  01/24/2012    Procedure: TRANSURETHRAL RESECTION OF BLADDER TUMOR (TURBT);  Surgeon: Molli Hazard, MD;  Location: [facility];  Service: Urology;  Laterality: N/A;  90 MIN ADD LEFT URETER BIOPSY TO PROCEDURE    . Cystoscopy with urethral dilatation  01/24/2012    Procedure: CYSTOSCOPY WITH URETHRAL DILATATION;  Surgeon: Molli Hazard, MD;  Location: [facility];  Service: Urology;  Laterality: N/A;  . Transurethral resection of bladder tumor N/A 07/10/2012    Procedure: TRANSURETHRAL RESECTION OF BLADDER TUMOR (TURBT);  Surgeon: Molli Hazard, MD;  Location: [facility];  Service: Urology;  Laterality: N/A;  . Cystoscopy w/ retrogrades Bilateral 07/10/2012    Procedure: CYSTOSCOPY WITH RETROGRADE PYELOGRAM;  Surgeon: Molli Hazard, MD;  Location: [facility];  Service: Urology;  Laterality: Bilateral;   Family History  Problem Relation Age of Onset  .  Diabetes Mellitus II Other   . Hypertension Other   . Bladder Cancer Neg Hx    History  Substance Use Topics  . Smoking status: Former Smoker -- 1.00 packs/day for 16 years    Types: Cigarettes    Quit date: 01/20/1992  . Smokeless tobacco: Former Systems developer    Types: [place] date: 01/20/1992  . Alcohol Use: No    Review of  Systems  Ten systems reviewed and are negative for acute change, except as noted in the HPI.    Allergies  Lisinopril  Home Medications   Current Outpatient Rx  Name  Route  Sig  Dispense  Refill  . amoxicillin (AMOXIL) 500 MG capsule   Oral   Take 1,000 mg by mouth 2 (two) times daily.         . citalopram (CELEXA) 40 MG tablet   Oral   Take 40 mg by mouth daily.          BP 124/69  Pulse 91  Temp(Src) 97.7 F (36.5 C) (Oral)  Resp 16  SpO2 100% Physical Exam Physical Exam  Nursing note and vitals reviewed. Constitutional: He appears well-developed and well-nourished. No distress.  HENT:  Head: Normocephalic and atraumatic.  Eyes: Conjunctivae normal are normal. No scleral icterus.  Neck: Normal range of motion. Neck supple.  Cardiovascular: Normal rate, regular rhythm and normal heart sounds.   Pulmonary/Chest: Effort normal and breath sounds normal. No respiratory distress.  Abdominal: Soft. There is no tenderness.  Musculoskeletal: He exhibits no edema.  Neurological: He is alert.  Skin: Skin is warm and dry. He is not diaphoretic.  Psychiatric: His behavior is normal.     ED Course  Procedures (including critical care time) Labs Review Labs Reviewed  CBC WITH DIFFERENTIAL - Abnormal; Notable for the following:    WBC 14.3 (*)    RBC 3.95 (*)    Hemoglobin 11.0 (*)    HCT 32.3 (*)    Platelets 424 (*)    Neutrophils Relative % 80 (*)    Neutro Abs 11.4 (*)    All other components within normal limits  COMPREHENSIVE METABOLIC PANEL - Abnormal; Notable for the following:    Sodium 128 (*)    CO2 9 (*)    Glucose, Bld 106 (*)    BUN 68 (*)    Creatinine, Ser 2.54 (*)    Calcium 12.6 (*)    Albumin 3.2 (*)    Total Bilirubin <0.2 (*)    GFR calc non Af Amer 27 (*)    GFR calc Af Amer 31 (*)    All other components within normal limits  URINALYSIS, ROUTINE W REFLEX MICROSCOPIC - Abnormal; Notable for the following:    Color, Urine AMBER (*)     APPearance CLOUDY (*)    Hgb urine dipstick MODERATE (*)    Bilirubin Urine MODERATE (*)    Protein, ur 100 (*)    Leukocytes, UA LARGE (*)    All other components within normal limits  LIPASE, BLOOD - Abnormal; Notable for the following:    Lipase 96 (*)    All other components within normal limits  URINE MICROSCOPIC-ADD ON - Abnormal; Notable for the following:    Bacteria, UA MANY (*)    All other components within normal limits  BLOOD GAS, ARTERIAL  I-STAT CG4 LACTIC ACID, ED   Imaging Review No results found.   EKG Interpretation None      MDM  Final diagnoses:  Metabolic acidosis with normal anion gap and bicarbonate losses  UTI (lower urinary tract infection)    8:18 PM Patient outside records reviewed. He was diagnosed with H.pylori infection.  Concern for leukocytosis. He appears well with no abdominal tenderness.   9:08 PM BP 124/69  Pulse 91  Temp(Src) 97.7 F (36.5 C) (Oral)  Resp 16  SpO2 100% Patient with leukocytosis with left shift, profound acidosis partially compensated.  Patient appears to have a urinary tract infection. Patient is hyponatremic at his serum bicarbonate is 9.  Elevated BUN and creatinine has risen almost 1 g to 2.54 he has a mildly elevated lipase at 96 There is concern for possible renal tubular acidosis.  Patient date bilateral ureteral stents have both been removed within the past month.  I have considered also refers sigmoid fistula.  The patient has not had any diarrhea . Will be sent for culture.  Dr. Princella Pellegrini will admit the patient.  I placed a consult to nephrology.  Patient has been seen previously by Dr. Clover Mealy. I spoke with nephrology who asks that we start the aptient on bicarb, get a renal US to make sure he does not have hydronephrosis.. The patient appears reasonably stabilized for admission considering the current resources, flow, and capabilities available in the ED at this time, and I doubt any other [facility]  requiring further screening and/or treatment in the ED prior to admission.   Margarita Mail, PA-C 06/27/13 2215

## 2013-06-26 ENCOUNTER — Encounter (HOSPITAL_COMMUNITY): Payer: Self-pay | Admitting: Emergency Medicine

## 2013-06-26 DIAGNOSIS — R112 Nausea with vomiting, unspecified: Secondary | ICD-10-CM | POA: Diagnosis present

## 2013-06-26 LAB — BASIC METABOLIC PANEL
BUN: 69 mg/dL — AB (ref 6–23)
BUN: 65 mg/dL — ABNORMAL HIGH (ref 6–23)
CO2: 11 meq/L — AB (ref 19–32)
CO2: 15 mEq/L — ABNORMAL LOW (ref 19–32)
Calcium: 11.7 mg/dL — ABNORMAL HIGH (ref 8.4–10.5)
Calcium: 11 mg/dL — ABNORMAL HIGH (ref 8.4–10.5)
Chloride: 109 mEq/L (ref 96–112)
Chloride: 103 mEq/L (ref 96–112)
Creatinine, Ser: 2.4 mg/dL — ABNORMAL HIGH (ref 0.50–1.35)
Creatinine, Ser: 2.36 mg/dL — ABNORMAL HIGH (ref 0.50–1.35)
GFR calc Af Amer: 34 mL/min — ABNORMAL LOW (ref >90)
GFR calc Af Amer: 34 mL/min — ABNORMAL LOW (ref >90)
GFR calc non Af Amer: 29 mL/min — ABNORMAL LOW (ref >90)
GFR calc non Af Amer: 30 mL/min — ABNORMAL LOW (ref >90)
GLUCOSE: 95 mg/dL (ref 70–99)
Glucose, Bld: 101 mg/dL — ABNORMAL HIGH (ref 70–99)
POTASSIUM: 4 meq/L (ref 3.7–5.3)
Potassium: 3.5 mEq/L — ABNORMAL LOW (ref 3.7–5.3)
SODIUM: 133 meq/L — AB (ref 137–147)
Sodium: 132 mEq/L — ABNORMAL LOW (ref 137–147)

## 2013-06-26 LAB — GLUCOSE, CAPILLARY
GLUCOSE-CAPILLARY: 205 mg/dL — AB (ref 70–99)
Glucose-Capillary: 75 mg/dL (ref 70–99)

## 2013-06-26 MED ORDER — MAGIC MOUTHWASH
10.0000 mL | Freq: Four times a day (QID) | ORAL | Status: DC
  Administered 2013-06-26 – 2013-06-28 (×6): 10 mL via ORAL
  Filled 2013-06-26 (×11): qty 10

## 2013-06-26 MED ORDER — ONDANSETRON HCL 4 MG/2ML IJ SOLN
4.0000 mg | Freq: Four times a day (QID) | INTRAMUSCULAR | Status: DC
  Administered 2013-06-26 – 2013-06-27 (×3): 4 mg via INTRAVENOUS
  Filled 2013-06-26 (×3): qty 2

## 2013-06-26 MED ORDER — PANTOPRAZOLE SODIUM 40 MG PO TBEC
40.0000 mg | DELAYED_RELEASE_TABLET | Freq: Two times a day (BID) | ORAL | Status: DC
  Administered 2013-06-26 – 2013-06-28 (×4): 40 mg via ORAL
  Filled 2013-06-26 (×5): qty 1

## 2013-06-26 NOTE — Consult Note (Addendum)
Referring Provider: [facility] hospital[facility]- Dr. Algis Liming Primary Care Physician:  Florina Ou, MD Primary Gastroenterologist:  none.  Reason for Consultation: Nausea/Vomiting  HPI: [name] [name] is a 53 y.o. male  with no prior GI history who requested to be seen in consultation by [facility].Marland Kitchen Patient has history of gout and diabetes mellitus currently diet-controlled. Also has history of hypertension anxiety depression and GERD. He has history of recurrent bladder carcinoma in situ and had undergone 2 previous TUR switch resections. In January of 2015 he went to the cancer center of [place] and underwent a radical cystoprostatectomy, bilateral pelvic lymphadenectomy and creation of an ileal neobladder. Patient reported an uncomplicated postoperative course. He returned [place] and was admitted on 05/09/2013 with pyelonephritis and grew Klebsiella and Citrobacter in his urine. He also had Sirs physiology. She was discharged after a 4 day hospital stay. He did develop acute renal insufficiency without illness. He was then readmitted on February 17 with worsening renal function studies with creatinine up to 3. In the interim he had gone back to the cancer treatment center of [place] and had his urethral catheter removed and a left 45-year-old stent removed. During that admission he was not clearly found to have any infection. It was felt that he may have had mucous plugging responsible for acute renal failure and metabolic acidosis.  Patiently admitted again yesterday with complaints of 3 week history of nausea vomiting and fatigue. He says he felt well for about 4 days after he was discharged last time and then has had a progressive decline since. He says he has absolutely no energy and no appetite. He says everything tastes bad or tastes strange and sometimes smells make him want to vomit. He has not had any fever or chills. No diarrhea. He has been trying to force himself to E. but says he can't eat  very much. His weight is down about 35 pounds since his surgery in January. He does admit to some heartburn and dysphagia to bread. He denies any abdominal pain. He has had Prilosec at home 20 mg and has been taking that daily. He also has Zofran but says that was ineffective. On readmission he was found to have a metabolic acidosis again and creatinine of 2.59 BUN of 68 lipase 96 CO2 of 9 LFTs within normal limits WBC of 14.3 hemoglobin 11.2 UA is abnormal and cultures pending.  After receiving IV fluids and antiemetics ,he is able to keep food down today and denies any nausea. No repeat CT imaging this admission   Past Medical History  Diagnosis Date  . Gout     recent flair -bilateral feet--  STABLE  . Frequency of urination   . Urgency incontinence   . Nocturia   . Diabetes mellitus type 2, diet-controlled PT STOPPED TAKING METFORMIN--  HAS BEEN WATCHING DIET, EXERCISING AND LOSING WT  . Bladder cancer 2013  . Complication of anesthesia     agitation w/awakening in 9/13,OK 01/24/12  . Hypertension   . Anxiety   . Depression   . GERD (gastroesophageal reflux disease)     Past Surgical History  Procedure Laterality Date  . [place] artery repair  2009    rt -trauma /thrombectomy,repair  . Transurethral resection of bladder tumor  12/15/2011    Procedure: TRANSURETHRAL RESECTION OF BLADDER TUMOR (TURBT);  Surgeon: Molli Hazard, MD;  Location: [facility];  Service: Urology;  Laterality: N/A;  2 HRS Procedure reads: cysto, TURBT , Bilateral retrograde pyelograms, poss  bilateral ureteroscopy with biopsy and ureter stent placement   . Lumbar laminectomy  06-02-2005    W/ RESECTION NERVE ROOT  L4  -  L5  . Transurethral resection of bladder tumor  01/24/2012    Procedure: TRANSURETHRAL RESECTION OF BLADDER TUMOR (TURBT);  Surgeon: Molli Hazard, MD;  Location: [facility];  Service: Urology;  Laterality: N/A;  90 MIN ADD LEFT URETER BIOPSY TO  PROCEDURE    . Cystoscopy with urethral dilatation  01/24/2012    Procedure: CYSTOSCOPY WITH URETHRAL DILATATION;  Surgeon: Molli Hazard, MD;  Location: [facility];  Service: Urology;  Laterality: N/A;  . Transurethral resection of bladder tumor N/A 07/10/2012    Procedure: TRANSURETHRAL RESECTION OF BLADDER TUMOR (TURBT);  Surgeon: Molli Hazard, MD;  Location: [facility];  Service: Urology;  Laterality: N/A;  . Cystoscopy w/ retrogrades Bilateral 07/10/2012    Procedure: CYSTOSCOPY WITH RETROGRADE PYELOGRAM;  Surgeon: Molli Hazard, MD;  Location: [facility];  Service: Urology;  Laterality: Bilateral;    Prior to Admission medications   Medication Sig Start Date End Date Taking? Authorizing Provider  amoxicillin (AMOXIL) 500 MG capsule Take 1,000 mg by mouth 2 (two) times daily. 06/22/13  Yes Historical Provider, MD  citalopram (CELEXA) 40 MG tablet Take 40 mg by mouth daily.   Yes Historical Provider, MD  omeprazole (PRILOSEC) 20 MG capsule Take 20 mg by mouth daily.   Yes Historical Provider, MD  ondansetron (ZOFRAN-ODT) 4 MG disintegrating tablet Take 4 mg by mouth every 8 (eight) hours as needed for nausea or vomiting.   Yes Historical Provider, MD    Current Facility-Administered Medications  Medication Dose Route Frequency Provider Last Rate Last Dose  . cefTRIAXone (ROCEPHIN) 1 g in dextrose 5 % 50 mL IVPB  1 g Intravenous Q24H Etta Quill, DO   1 g at 06/25/13 2203  . citalopram (CELEXA) tablet 40 mg  40 mg Oral Daily Etta Quill, DO   40 mg at 06/26/13 [id number]  . heparin injection 5,000 Units  5,000 Units Subcutaneous 3 times per day Etta Quill, DO   5,000 Units at 06/26/13 0547  . ondansetron (ZOFRAN) injection 4 mg  4 mg Intravenous Q6H PRN Etta Quill, DO      . pantoprazole (PROTONIX) EC tablet 40 mg  40 mg Oral Daily Etta Quill, DO   40 mg at 06/26/13 [id number]  . sodium bicarbonate 150 mEq in  sterile water 1,000 mL infusion   Intravenous Continuous Margarita Mail, PA-C 75 mL/hr at 06/26/13 [id number]    . sodium chloride 0.9 % injection 3 mL  3 mL Intravenous Q12H Etta Quill, DO       Facility-Administered Medications Ordered in Other Encounters  Medication Dose Route Frequency Provider Last Rate Last Dose  . levofloxacin (LEVAQUIN) IVPB 500 mg  500 mg Intravenous Q24H Molli Hazard, MD        Allergies as of 06/25/2013 - Review Complete 06/25/2013  Allergen Reaction Noted  . Lisinopril Cough 05/30/2013    Family History  Problem Relation Age of Onset  . Diabetes Mellitus II Other   . Hypertension Other   . Bladder Cancer Neg Hx     History   Social History  . Marital Status: Married    Spouse Name: N/A    Number of Children: N/A  . Years of Education: N/A   Occupational History  . Not on file.  Social History Main Topics  . Smoking status: Former Smoker -- 1.00 packs/day for 16 years    Types: Cigarettes    Quit date: 01/20/1992  . Smokeless tobacco: Former Systems developer    Types: Heartwell date: 01/20/1992  . Alcohol Use: No  . Drug Use: No  . Sexual Activity: Not Currently   Other Topics Concern  . Not on file   Social History Narrative  . No narrative on file    Review of Systems: ROS negative except as outlined in HPI .  Physical Exam: Vital signs in last 24 hours: Temp:  [97.7 F (36.5 C)-99.7 F (37.6 C)] 98.1 F (36.7 C) (03/24 0547) Pulse Rate:  [71-91] 71 (03/24 0547) Resp:  [16-19] 19 (03/24 0547) BP: (115-134)/(61-69) 115/61 mmHg (03/24 0547) SpO2:  [100 %] 100 % (03/24 0547) Weight:  [180 lb 1.6 oz (81.693 kg)] 180 lb 1.6 oz (81.693 kg) (03/23 2358) Last BM Date: 06/22/13 General:   Alert,  Well-developed, WM pleasant and cooperative in NAD Head:  Normocephalic and atraumatic. Eyes:  Sclera clear, no icterus.   Conjunctiva pink. Ears:  Normal auditory acuity. Nose:  No deformity, discharge,  or lesions. Mouth:  No  deformity or lesions. ,no obvious thrush  Neck:  Supple; no masses or thyromegaly. Lungs:  Clear throughout to auscultation.   No wheezes, crackles, or rhonchi. Heart:  Regular rate and rhythm; no murmurs, clicks, rubs,  or gallops. Abdomen:  Soft,nontender, BS active,nonpalp mass or hsm,,healing incisional scar.   Rectal:  Deferred  Msk:  Symmetrical without gross deformities. . Pulses:  Normal pulses noted. Extremities:  Without clubbing or edema. Neurologic:  Alert and  oriented x4;  grossly normal neurologically. Skin:  Intact without significant lesions or rashes.. Psych:  Alert and cooperative. Normal mood and affect.  Lab Results:  Recent Labs  06/25/13 1950  WBC 14.3*  HGB 11.0*  HCT 32.3*  PLT 424*   BMET  Recent Labs  06/25/13 1950 06/26/13 0414  NA 128* 133*  K 4.1 4.0  CL 103 109  CO2 9* 11*  GLUCOSE 106* 101*  BUN 68* 69*  CREATININE 2.54* 2.40*  CALCIUM 12.6* 11.7*   LFT  Recent Labs  06/25/13 1950  PROT 8.3  ALBUMIN 3.2*  AST 11  ALT 14  ALKPHOS 82  BILITOT <0.2*    IMPRESSION:   #1 53 yo male with history of recurrent bladder cancer now status post radical cystoprostatectomy and bilateral pelvic lymphadenectomy as well as creation of an ileal neobladder in January of 2015 with surgery done at the cancer center [place]. Initially uncomplicated postoperative course the patient has had 2 previous admissions in the past month and a half with acute renal failure on chronic kidney disease complicated by metabolic acidosis. The first admission he did have a UTI with Klebsiella and Citrobacter, the second admission no infection identified but felt to have mucous plugging. Patient now presenting with 3 week history of lack of appetite intermittent nausea vomiting and again found to have a metabolic acidosis and elevated creatinine at 2.5 as well as leukocytosis. Urine cultures pending Unclear whether the patient has any primary GI problem or whether the  nausea and vomiting is secondary to acute renal failure with these episodes. He also complains of food tasting bad and certainly may have candidiasis. He is diabetic and may also be prone to gastroparesis which may be contributing to the nausea and vomiting.  #2 chronic GERD #3 anxiety and depression #  4 history of hypertension  Plan; IV Protonix twice daily Around-the-clock anti-emetics Start Magic mouthwash 5 cc 4 times daily swish and swallow Consider appeared course of Diflucan Await urine culture and urology consultation Will consider EGD       Amy Esterwood  06/26/2013, 1:22 PM  [facility] Attending  I have also seen and assessed the patient and agree with the above note. Doubt a primary GI issue.  I notice he has hypercalcemia (higher than # due to low albumin) which may be cause of the nausea and vomiting. Bicarbonate losses from ileal pouch/neo-bladder lead to acidosis also ? Some effect.  Will check ionized calcium, phosphorus with other electrolytes in AM  He has seen Corliss Parish re: renal failure but did not discuss nausea problems with her. ? If he needs chronic supplementation of bicarb and also Tx for hypercalcemia (? If related or there is another cause)  Gatha Mayer, MD, [facility] [phone] (pager) 06/26/2013 8:09 PM

## 2013-06-26 NOTE — Progress Notes (Addendum)
PROGRESS NOTE    JATAVIUS MANCHEGO [id number] DOB: [birth year] DOA: 06/25/2013 PCP: Florina Ou, MD  HPI/Brief narrative 53 year old male with history of gout, diet controlled diabetes, hypertension, anxiety and depression, GERD, bladder cancer and complex urological history, 2 prior admissions in February for pyelonephritis,SIRS,AKI and metabolic acidosis. He was now readmitted on 06/25/13 with 3 week complaints of nausea, vomiting-able to keep down some liquids but no solids, progressive weight loss of 35 pounds, generalized weakness and dysuria. Denies fevers. Intermittent heartburn. Denies abdominal pain. Constipated. In the ED, found to have non-anion gap metabolic acidosis, acute renal failure and possible UTI.    Assessment/Plan:  1. Intractable nausea, vomiting and poor appetite: DD-gastritis, esophagitis, PUD, GERD, gastroparesis versus secondary to UTI and metabolic abnormalities. Treat UTI and metabolic abnormalities. GI consulted for further evaluation. Continue PPIs. 2. Non-anion gap metabolic acidosis: Possibly from problem #1. Continue bicarbonate drip and monitor BMP closely. 3. Presumed UTI: Patient does have dysuria. Continue IV Rocephin pending urine culture results. 4. Acute on chronic kidney disease: Secondary to dehydration. Creatinine is slightly better. Continue hydration and follow daily BMP. 5. Anemia: Follow daily CBCs. Seems chronic and stable. 6. Dehydration/hypercalcemia: IV fluids and follow daily labs. 7. Hypertension: Soft blood pressures. Not on medications. 8. History of recurrent bladder cancer: Has complex urological history. Apparently had all hardware removed and has neo bladder. Follows with [facility] in [place]. 9. History of diet controlled diabetes: Monitor CBGs 10. History of anxiety and depression: Stable. Continue Celexa 11. History of GERD: PPIs. Per GI  Code Status: Full Family Communication: Discussed with patient spouse  and daughter at bedside. Disposition Plan: Home when medically stable.   Consultants:  Gastroenterology  Procedures:  None  Antibiotics:  IV Rocephin 3/23 >   Subjective: Feels slightly stronger than on admission. Intermittent nausea and vomiting-especially to solids. Profound weight loss and generalized weakness. Dysuria +.  Objective: Filed Vitals:   06/25/13 1912 06/25/13 2358 06/26/13 0547 06/26/13 1400  BP: 124/69 134/68 115/61 103/60  Pulse: 91 82 71 70  Temp: 97.7 F (36.5 C) 99.7 F (37.6 C) 98.1 F (36.7 C) 98.3 F (36.8 C)  TempSrc: Oral Oral Oral Oral  Resp: 16 18 19 16   Height:  5\' 10"  (1.778 m)    Weight:  81.693 kg (180 lb 1.6 oz)    SpO2: 100% 100% 100% 100%    Intake/Output Summary (Last 24 hours) at 06/26/13 1502 Last data filed at 06/26/13 1300  Gross per 24 hour  Intake    240 ml  Output      0 ml  Net    240 ml   Filed Weights   06/25/13 2358  Weight: 81.693 kg (180 lb 1.6 oz)     Exam:  General exam:  pleasant middle-aged male lying comfortably in bed. Respiratory system: Clear. No increased work of breathing. Cardiovascular system: S1 & S2 heard, RRR. No JVD, murmurs, gallops, clicks or pedal edema. Telemetry: Sinus rhythm.  Gastrointestinal system: Abdomen is nondistended, soft and nontender. Normal bowel sounds heard. Healed midline laparotomy scar.  Central nervous system: Alert and oriented. No focal neurological deficits. Extremities: Symmetric 5 x 5 power.   Data Reviewed: Basic Metabolic Panel:  Recent Labs Lab 06/25/13 1950 06/26/13 0414  NA 128* 133*  K 4.1 4.0  CL 103 109  CO2 9* 11*  GLUCOSE 106* 101*  BUN 68* 69*  CREATININE 2.54* 2.40*  CALCIUM 12.6* 11.7*   Liver Function Tests:  Recent  Labs Lab 06/25/13 1950  AST 11  ALT 14  ALKPHOS 73  BILITOT <0.2*  PROT 8.3  ALBUMIN 3.2*    Recent Labs Lab 06/25/13 1950  LIPASE 96*   No results found for this basename: AMMONIA,  in the last 168  hours CBC:  Recent Labs Lab 06/25/13 1950  WBC 14.3*  NEUTROABS 11.4*  HGB 11.0*  HCT 32.3*  MCV 81.8  PLT 424*   Cardiac Enzymes: No results found for this basename: CKTOTAL, CKMB, CKMBINDEX, TROPONINI,  in the last 168 hours BNP (last 3 results) No results found for this basename: PROBNP,  in the last 8760 hours CBG: No results found for this basename: GLUCAP,  in the last 168 hours  No results found for this or any previous visit (from the past 240 hour(s)).    Studies: Dg Abd Acute W/chest  06/25/2013   CLINICAL DATA:  Abdominal pain.  Fatigue.  Bladder cancer.  EXAM: ACUTE ABDOMEN SERIES (ABDOMEN 2 VIEW & CHEST 1 VIEW)  COMPARISON:  DG CHEST 2 VIEW dated 05/24/2013; CT ABD/PELV WO CM dated 05/21/2013  FINDINGS: The lungs appear clear.  Cardiac and mediastinal contours normal.  No pleural effusion identified.  No free intraperitoneal gas. Clips project over the anatomic pelvis. Bowel gas pattern unremarkable. No significant abnormal air-fluid levels. 3 mm right kidney lower pole nonobstructive calculus.  Pars defects are visible at L4.  IMPRESSION: 1. 3 mm right kidney lower pole calculus. 2. 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   Electronically Signed   By: Sherryl Barters M.D.   On: 06/25/2013 20:28        Scheduled Meds: . cefTRIAXone (ROCEPHIN)  IV  1 g Intravenous Q24H  . citalopram  40 mg Oral Daily  . heparin  5,000 Units Subcutaneous 3 times per day  . magic mouthwash  10 mL Oral QID  . ondansetron (ZOFRAN) IV  4 mg Intravenous 4 times per day  . pantoprazole  40 mg Oral BID  . sodium chloride  3 mL Intravenous Q12H   Continuous Infusions: .  sodium bicarbonate 150 mEq in sterile water 1000 mL infusion 75 mL/hr at 06/26/13 [id number]    Active Problems:   Metabolic acidosis with normal anion gap and bicarbonate losses   UTI (lower urinary tract infection)   Acute-on-chronic kidney injury    Time spent: 45 minutes.    Vernell Leep, MD, FACP, FHM. [facility] Pager [phone]  If 7PM-7AM, please contact night-coverage [email or web address] Password [email or web address] 06/26/2013, 3:02 PM    LOS: 1 day

## 2013-06-27 DIAGNOSIS — I10 Essential (primary) hypertension: Secondary | ICD-10-CM

## 2013-06-27 LAB — BASIC METABOLIC PANEL
BUN: 63 mg/dL — ABNORMAL HIGH (ref 6–23)
CHLORIDE: 103 meq/L (ref 96–112)
CO2: 18 mEq/L — ABNORMAL LOW (ref 19–32)
Calcium: 10.4 mg/dL (ref 8.4–10.5)
Creatinine, Ser: 2.19 mg/dL — ABNORMAL HIGH (ref 0.50–1.35)
GFR calc Af Amer: 37 mL/min — ABNORMAL LOW (ref >90)
GFR calc non Af Amer: 32 mL/min — ABNORMAL LOW (ref >90)
GLUCOSE: 93 mg/dL (ref 70–99)
POTASSIUM: 3 meq/L — AB (ref 3.7–5.3)
Sodium: 134 mEq/L — ABNORMAL LOW (ref 137–147)

## 2013-06-27 LAB — CBC
HCT: 24.3 % — ABNORMAL LOW (ref 39.0–52.0)
Hemoglobin: 8.3 g/dL — ABNORMAL LOW (ref 13.0–17.0)
MCH: 27.2 pg (ref 26.0–34.0)
MCHC: 34.2 g/dL (ref 30.0–36.0)
MCV: 79.7 fL (ref 78.0–100.0)
Platelets: 321 10*3/uL (ref 150–400)
RBC: 3.05 MIL/uL — AB (ref 4.22–5.81)
RDW: 14.4 % (ref 11.5–15.5)
WBC: 8.3 10*3/uL (ref 4.0–10.5)

## 2013-06-27 LAB — MAGNESIUM: Magnesium: 2.3 mg/dL (ref 1.5–2.5)

## 2013-06-27 LAB — GLUCOSE, CAPILLARY
GLUCOSE-CAPILLARY: 181 mg/dL — AB (ref 70–99)
Glucose-Capillary: 97 mg/dL (ref 70–99)
Glucose-Capillary: 193 mg/dL — ABNORMAL HIGH (ref 70–99)
Glucose-Capillary: 105 mg/dL — ABNORMAL HIGH (ref 70–99)

## 2013-06-27 LAB — URINE CULTURE
Colony Count: 10000
Special Requests: NORMAL

## 2013-06-27 LAB — CALCIUM, IONIZED: CALCIUM ION: 1.58 mmol/L — AB (ref 1.12–1.23)

## 2013-06-27 LAB — PHOSPHORUS: PHOSPHORUS: 3.2 mg/dL (ref 2.3–4.6)

## 2013-06-27 MED ORDER — SODIUM BICARBONATE 650 MG PO TABS
650.0000 mg | ORAL_TABLET | Freq: Three times a day (TID) | ORAL | Status: DC
  Administered 2013-06-27 – 2013-06-28 (×2): 650 mg via ORAL
  Filled 2013-06-27 (×4): qty 1

## 2013-06-27 MED ORDER — POTASSIUM CHLORIDE CRYS ER 20 MEQ PO TBCR
30.0000 meq | EXTENDED_RELEASE_TABLET | Freq: Two times a day (BID) | ORAL | Status: AC
  Administered 2013-06-27 (×2): 30 meq via ORAL
  Filled 2013-06-27 (×2): qty 1

## 2013-06-27 NOTE — ED Provider Notes (Signed)
Medical screening examination/treatment/procedure(s) were performed by non-physician practitioner and as supervising physician I was immediately available for consultation/collaboration.   EKG Interpretation None        Charles B. Karle Starch, MD 06/27/13 2320

## 2013-06-27 NOTE — Progress Notes (Signed)
PROGRESS NOTE    [name] [name] [id number] DOB: September 15, 1960 DOA: 06/25/2013 PCP: Florina Ou, MD  HPI/Brief narrative 54 year old male with history of gout, diet controlled diabetes, hypertension, anxiety and depression, GERD, bladder cancer and complex urological history, 2 prior admissions in February for pyelonephritis,SIRS,AKI and metabolic acidosis. He was now readmitted on 06/25/13 with 3 week complaints of nausea, vomiting-able to keep down some liquids but no solids, progressive weight loss of 35 pounds, generalized weakness and dysuria. Denies fevers. Intermittent heartburn. Denies abdominal pain. Constipated. In the ED, found to have non-anion gap metabolic acidosis, acute renal failure and possible UTI.    Assessment/Plan:  1. Intractable nausea, vomiting and poor appetite: DD includes-gastritis, esophagitis, PUD, GERD, gastroparesis versus secondary to UTI and metabolic abnormalities. Treat UTI and metabolic abnormalities. GI consulted for further evaluation. Continue PPIs ad advance diet as tolerated 2. Non-anion gap metabolic acidosis and hypokalemia: Possibly from problem #1. Will start daily bicarb yablets. Replete potassium and follow BMET in am. 3. Presumed UTI: Patient does have dysuria. Continue IV Rocephin one more day. Patient symptoms improving. No fever and cultures w/o specific microorganism  4. Acute on chronic kidney disease: Secondary to dehydration. Creatinine continue to improved. Patient with stage 3 at baseline.  5. Anemia: Follow daily CBCs. Seems chronic and stable. 6. Dehydration/hypercalcemia: IV fluids and follow daily labs. Most likely from acute on chronic renal failure. Will check PTH 7. Hypertension: Soft blood pressures. Not on medications. Continue heart healthy diet 8. History of recurrent bladder cancer: Has complex urological history. Apparently had all hardware removed and has neo bladder. Follows with [facility] in  [place]. 9. History of diet controlled diabetes: Monitor CBGs. Follow up with PCP to establish hypoglycemic regimen if needed. 10. History of anxiety and depression: Stable. Continue Celexa. No SI 11. History of GERD: PPIs. Per GI no further work up indicated. Continue advancing diet as tolerated  Code Status: Full Family Communication: Discussed with patient spouse and daughter at bedside. Disposition Plan: Home when medically stable.   Consultants:  Gastroenterology  Procedures:  None  Antibiotics:  IV Rocephin 3/23 >   Subjective: Feeling better and tolerating diet. No further nausea or vomiting. Afebrile. No CP, no SOB.  Objective: Filed Vitals:   06/26/13 1400 06/26/13 2027 06/27/13 0453 06/27/13 1514  BP: 103/60 116/57 99/59 106/61  Pulse: 70 78 66 70  Temp: 98.3 F (36.8 C) 97.7 F (36.5 C) 98.1 F (36.7 C) 98.3 F (36.8 C)  TempSrc: Oral Oral Oral Oral  Resp: 16 18 18 20   Height:      Weight:      SpO2: 100% 100% 100% 99%    Intake/Output Summary (Last 24 hours) at 06/27/13 2217 Last data filed at 06/27/13 1845  Gross per 24 hour  Intake   1485 ml  Output   1402 ml  Net     83 ml   Filed Weights   06/25/13 2358  Weight: 81.693 kg (180 lb 1.6 oz)     Exam:  General exam:  pleasant middle-aged male lying comfortably in bed. Afebrile and feeling better Respiratory system: Clear. No increased work of breathing. Cardiovascular system: S1 & S2 heard, RRR. No JVD, murmurs, gallops, clicks or pedal edema. Telemetry: Sinus rhythm.  Gastrointestinal system: Abdomen is nondistended, soft and nontender. Normal bowel sounds heard. Healed midline laparotomy scar.  Central nervous system: Alert and oriented. No focal neurological deficits. Extremities: Symmetric 5 x 5 power.   Data Reviewed: Basic  Metabolic Panel:  Recent Labs Lab 06/25/13 1950 06/26/13 0414 06/26/13 1659 06/27/13 0410  NA 128* 133* 132* 134*  K 4.1 4.0 3.5* 3.0*  CL 103 109 103  103  CO2 9* 11* 15* 18*  GLUCOSE 106* 101* 95 93  BUN 68* 69* 65* 63*  CREATININE 2.54* 2.40* 2.36* 2.19*  CALCIUM 12.6* 11.7* 11.0* 10.4  MG  --   --   --  2.3  PHOS  --   --   --  3.2   Liver Function Tests:  Recent Labs Lab 06/25/13 1950  AST 11  ALT 14  ALKPHOS 73  BILITOT <0.2*  PROT 8.3  ALBUMIN 3.2*    Recent Labs Lab 06/25/13 1950  LIPASE 96*   CBC:  Recent Labs Lab 06/25/13 1950 06/27/13 0410  WBC 14.3* 8.3  NEUTROABS 11.4*  --   HGB 11.0* 8.3*  HCT 32.3* 24.3*  MCV 81.8 79.7  PLT 424* 321   CBG:  Recent Labs Lab 06/26/13 1707 06/26/13 2134 06/27/13 0747 06/27/13 1156 06/27/13 1707  GLUCAP 75 205* 97 193* 105*    Recent Results (from the past 240 hour(s))  URINE CULTURE     Status: None   Collection Time    06/26/13  5:52 PM      Result Value Ref Range Status   Specimen Description URINE, CLEAN CATCH   Final   Special Requests rocephin Normal   Final   Culture  Setup Time     Final   Value: 06/26/2013 21:45     Performed at [facility] Count     Final   Value: 10,000 COLONIES/ML     Performed at [facility]   Culture     Final   Value: Multiple bacterial morphotypes present, none predominant. Suggest appropriate recollection if clinically indicated.     Performed at [facility]   Report Status 06/27/2013 FINAL   Final      Studies: No results found.      Scheduled Meds: . cefTRIAXone (ROCEPHIN)  IV  1 g Intravenous Q24H  . citalopram  40 mg Oral Daily  . heparin  5,000 Units Subcutaneous 3 times per day  . magic mouthwash  10 mL Oral QID  . ondansetron (ZOFRAN) IV  4 mg Intravenous 4 times per day  . pantoprazole  40 mg Oral BID  . sodium chloride  3 mL Intravenous Q12H   Continuous Infusions: .  sodium bicarbonate 150 mEq in sterile water 1000 mL infusion 75 mL/hr at 06/27/13 1324    Active Problems:   Metabolic acidosis with normal anion gap and bicarbonate losses   UTI (lower  urinary tract infection)   Acute-on-chronic kidney injury   Hypercalcemia   Nausea and vomiting    Time spent: >30 minutes.    Barton Dubois, MD [facility] Pager [phone]  If 7PM-7AM, please contact night-coverage [email or web address] Password [facility] 06/27/2013, 10:17 PM    LOS: 2 days

## 2013-06-27 NOTE — Progress Notes (Signed)
Patient ID: [name] [name], male   DOB: 15-Aug-1960, 53 y.o.   MRN: [id number] [facility] Progress Note  Subjective:  Feels much better- no nausea, and able to eat- getting taste back ..thinks magic mouthwash helping with altered taste, had a BM. No pain  Objective:  Vital signs in last 24 hours: Temp:  [97.7 F (36.5 C)-98.3 F (36.8 C)] 98.1 F (36.7 C) (03/25 0453) Pulse Rate:  [66-78] 66 (03/25 0453) Resp:  [16-18] 18 (03/25 0453) BP: (99-116)/(57-60) 99/59 mmHg (03/25 0453) SpO2:  [100 %] 100 % (03/25 0453) Last BM Date: 06/27/13 General:   Alert,  Well-developed, WM   in NAD Heart:  Regular rate and rhythm; no murmurs Pulm;clear Abdomen:  Soft, nontender and nondistended. Normal bowel sounds, without guarding, and without rebound.   Extremities:  Without edema. Neurologic:  Alert and  oriented x4;  grossly normal neurologically. Psych:  Alert and cooperative. Normal mood and affect.  Intake/Output from previous day: 03/24 0701 - 03/25 0700 In: 2205 [P.O.:480; I.V.:1725] Out: 1100 [Urine:1100] Intake/Output this shift:    Lab Results:  Recent Labs  06/25/13 1950 06/27/13 0410  WBC 14.3* 8.3  HGB 11.0* 8.3*  HCT 32.3* 24.3*  PLT 424* 321   BMET  Recent Labs  06/26/13 0414 06/26/13 1659 06/27/13 0410  NA 133* 132* 134*  K 4.0 3.5* 3.0*  CL 109 103 103  CO2 11* 15* 18*  GLUCOSE 101* 95 93  BUN 69* 65* 63*  CREATININE 2.40* 2.36* 2.19*  CALCIUM 11.7* 11.0* 10.4   LFT  Recent Labs  06/25/13 1950  PROT 8.3  ALBUMIN 3.2*  AST 11  ALT 14  ALKPHOS 58  BILITOT <0.2*    Assessment / Plan:  #1  53 yo male with recurrent bladder CA-now s/p radical cystectomy, and node resection with creation of ileal neobladder  04/2013- pt with recurrent metabolic acidosis, AKI since. Presented with nausea/vomiting this admit. Has had significant weight loss/multifactoral  Not a primary Gi issue- sxs much improved with antiemetics around the clock,  increased PPI, and Magic mouthwash ? Candidiasis. Would ask renal to see regarding  /supplementing bicarb at home etc On Rocephin- culture pending We will be available  Active Problems:   Metabolic acidosis with normal anion gap and bicarbonate losses   UTI (lower urinary tract infection)   Acute-on-chronic kidney injury   Hypercalcemia   Nausea and vomiting     LOS: 2 days   Amy Esterwood  06/27/2013, 9:30 AM   [facility] Attending  I have also seen and assessed the patient and agree with the above note. Clearly better. Eating lunckhok. Ionized Ca high at 1.5  I don't quite understand why he has that but think nausea and vomiting could have come from that and this is a recurrent issue. Maybe renal can help with that. PTH levels might be needed unless they did that at renal clinic when he saw Dr. Moshe Cipro.  Please call us back if ?'s  Gatha Mayer, MD, [facility] [phone] (pager) 06/27/2013 12:41 PM

## 2013-06-27 NOTE — Evaluation (Signed)
Physical Therapy Evaluation-1x Patient Details Name: LAMARE ANTES MRN: [id number] DOB: [birth year] Today's Date: 06/27/2013   History of Present Illness  53 yo male admitted with metabolic acidosis, UTI, N/V. Hx of gout, DM, HTN, bladder cancer with neobladder placement, anxiety  Clinical Impression  On eval, pt was Mod Ind with mobility-able to ambulate ~500 feet. No LOB. Recommend daily mobility with nursing supervision during stay. PT will sign off. 1x eval.     Follow Up Recommendations No PT follow up    Equipment Recommendations  None recommended by PT    Recommendations for Other Services       Precautions / Restrictions Precautions Precautions: None Restrictions Weight Bearing Restrictions: No      Mobility  Bed Mobility Overal bed mobility: Modified Independent                Transfers Overall transfer level: Modified independent                  Ambulation/Gait Ambulation/Gait assistance: Modified independent (Device/Increase time) Ambulation Distance (Feet): 500 Feet Assistive device: None Gait Pattern/deviations: Wide base of support     General Gait Details: slow gait speed. no LOB.   Stairs            Wheelchair Mobility    Modified Rankin (Stroke Patients Only)       Balance                                     Pertinent Vitals/Pain No c/o pain    Home Living Family/patient expects to be discharged to:: Private residence Living Arrangements: Alone Available Help at Discharge: Family Type of Home: House Home Access: Stairs to enter Entrance Stairs-Rails: None Technical brewer of Steps: 3 Home Layout: One level Home Equipment: None      Prior Function Level of Independence: Independent               Hand Dominance   Dominant Hand: Right    Extremity/Trunk Assessment   Upper Extremity Assessment: Overall WFL for tasks assessed           Lower Extremity Assessment: Overall  WFL for tasks assessed      Cervical / Trunk Assessment: Normal  Communication   Communication: No difficulties  Cognition Arousal/Alertness: Awake/alert Behavior During Therapy: WFL for tasks assessed/performed Overall Cognitive Status: Within Functional Limits for tasks assessed                      General Comments      Exercises        Assessment/Plan    PT Assessment Patent does not need any further PT services  PT Diagnosis     PT Problem List    PT Treatment Interventions     PT Goals (Current goals can be found in the Care Plan section) Acute Rehab PT Goals Patient Stated Goal: home soon PT Goal Formulation: No goals set, d/c therapy    Frequency     Barriers to discharge        End of Session   Activity Tolerance: Patient tolerated treatment well Patient left: in bed;with call bell/phone within reach;with nursing/sitter in room         Time: 1131-1144 PT Time Calculation (min): 13 min   Charges:   PT Evaluation $Initial PT Evaluation Tier I: 1 Procedure PT Treatments $Gait Training: 8-22 mins  PT G Codes:          Weston Anna, MPT Pager: [phone]

## 2013-06-28 LAB — PTH, INTACT AND CALCIUM
Calcium, Total (PTH): 9.4 mg/dL (ref 8.4–10.5)
PTH: 5.2 pg/mL — ABNORMAL LOW (ref 14.0–72.0)

## 2013-06-28 LAB — BASIC METABOLIC PANEL
BUN: 57 mg/dL — AB (ref 6–23)
CALCIUM: 10.3 mg/dL (ref 8.4–10.5)
CO2: 19 meq/L (ref 19–32)
Chloride: 104 mEq/L (ref 96–112)
Creatinine, Ser: 2.24 mg/dL — ABNORMAL HIGH (ref 0.50–1.35)
GFR calc Af Amer: 36 mL/min — ABNORMAL LOW (ref >90)
GFR calc non Af Amer: 31 mL/min — ABNORMAL LOW (ref >90)
GLUCOSE: 102 mg/dL — AB (ref 70–99)
Potassium: 3.2 mEq/L — ABNORMAL LOW (ref 3.7–5.3)
Sodium: 134 mEq/L — ABNORMAL LOW (ref 137–147)

## 2013-06-28 LAB — CBC
HEMATOCRIT: 25.2 % — AB (ref 39.0–52.0)
HEMOGLOBIN: 8.6 g/dL — AB (ref 13.0–17.0)
MCH: 27.2 pg (ref 26.0–34.0)
MCHC: 34.1 g/dL (ref 30.0–36.0)
MCV: 79.7 fL (ref 78.0–100.0)
Platelets: 357 10*3/uL (ref 150–400)
RBC: 3.16 MIL/uL — ABNORMAL LOW (ref 4.22–5.81)
RDW: 14.2 % (ref 11.5–15.5)
WBC: 9.3 10*3/uL (ref 4.0–10.5)

## 2013-06-28 LAB — GLUCOSE, CAPILLARY
GLUCOSE-CAPILLARY: 153 mg/dL — AB (ref 70–99)
Glucose-Capillary: 92 mg/dL (ref 70–99)

## 2013-06-28 MED ORDER — OMEPRAZOLE 40 MG PO CPDR: 40.0000 mg | DELAYED_RELEASE_CAPSULE | Freq: Two times a day (BID) | ORAL | Status: DC

## 2013-06-28 MED ORDER — SODIUM BICARBONATE 650 MG PO TABS: 650.0000 mg | ORAL_TABLET | Freq: Three times a day (TID) | ORAL | Status: DC

## 2013-06-28 MED ORDER — ONDANSETRON 4 MG PO TBDP: 4.0000 mg | ORAL_TABLET | Freq: Three times a day (TID) | ORAL | Status: DC | PRN

## 2013-06-28 MED ORDER — CEFUROXIME AXETIL 500 MG PO TABS: 500.0000 mg | ORAL_TABLET | Freq: Two times a day (BID) | ORAL | Status: AC

## 2013-06-28 NOTE — Progress Notes (Signed)
Went over discharge instructions with patient and prescriptions, he understands and does not have any further questions.

## 2013-06-28 NOTE — Discharge Summary (Signed)
Physician Discharge Summary  [name] [name] [id number] DOB: [birth year] DOA: 06/25/2013  PCP: Florina Ou, MD  Admit date: 06/25/2013 Discharge date: 06/28/2013  Time spent: >30 minutes  Recommendations for Outpatient Follow-up:  1. BMET to follow electrolytes and renal function 2. Follow PTH and intact calcium level pending at discharge 3. Patient to follow with renal service for further evaluation and treatment  Discharge Diagnoses:  Active Problems:   Metabolic acidosis with normal anion gap and bicarbonate losses   UTI (lower urinary tract infection)   Acute-on-chronic kidney injury   Hypercalcemia   Nausea and vomiting   Discharge Condition: stable and improved. Patient discharged home with instruction to keep himself well hydrated and to follow with PCP and nephrologist as an outpatient.  Diet recommendation: low sodium diet   Filed Weights   06/25/13 2358  Weight: 81.693 kg (180 lb 1.6 oz)    History of present illness:  53 y.o. male who presents to the ED with N/V for the past 3 weeks and generalized fatigue. [facility] and admitted between 2/16 to 2/20 for ARF and metabolic acidosis that ultimately was improved with IVF apparently, although initially he was believed to have SIRS due to a Klebselia UTI (see discharge summary on 05/25/13 by Dr. Thereasa Solo for a full report). Prior to that admit the R renal stent was removed, and during that admit he passed the remaining stent on 2/18. (Regarding why the stents were in place: Patient has long and complex urologic history including radical resection of the bladder for bladder cancer and construction of a neo-bladder performed on 1/22 in [place], stents were put in at that time).  He was feeling better after that admit he says for about 4 days. 4 days after discharge or so he began to slowly but progressively feel worse, develop N/V, and ultimately re-presented to the ED today.  Work up in the ED today demonstrates his  creatinine and BUN are back up, and he is acidotic with a NAG metabolic acidosis as seen on his ABG.  Hospital Course:  1. Intractable nausea, vomiting and poor appetite: DD includes-gastritis, esophagitis, PUD, GERD, gastroparesis versus secondary to UTI and metabolic abnormalities. Treatment for UTI and metabolic abnormalities repletion provided. GI consulted for further evaluation while inpatient but not procedures needed. Symptoms improved with bowel rest and supportive care. Has been discharged on PPI BID  2. Non-anion gap metabolic acidosis and hypokalemia: Possibly from problem #1. Will discharge on daily bicarb tablets. Potassium repleted. Will need BMET as an outpatient. 3. Presumed UTI: Patient dysuria improved at discharge. Received 4 days of IV rocephin and discharge on ceftin BID to complete a total of 10 days antibiotic treatment. No fever and cultures w/o specific microorganism  4. Acute on chronic kidney disease: Secondary to dehydration and UTI. Creatinine continue to improved. Patient with stage 3 at baseline. At discharge Cr 2.2 5. Anemia: Seems chronic and stable. 6. Dehydration/hypercalcemia: resolved with IV fluids. Most likely from acute on chronic renal failure. PTH pending at discharge. Needs follow up with nephrology service 7. Hypertension:  Continue heart healthy diet. BP stable 8. History of recurrent bladder cancer: Has complex urological history. Apparently had all hardware removed and has a neo bladder. Follows with [facility] in [place]. 9. History of diet controlled diabetes: Follow up with PCP to establish hypoglycemic regimen if needed. 10. History of anxiety and depression: Stable. Continue Celexa. No SI 11. History of GERD: continue PPIs. Per GI no  further work up indicated.    Procedures: See below for x-ray reports   Consultations:  GI  Discharge Exam: Filed Vitals:   06/28/13 0544  BP: 110/65  Pulse: 73  Temp: 98.1 F (36.7 C)   Resp: 20   General exam: pleasant middle-aged male lying comfortably in bed. Afebrile and feeling better. Wants to go home and has been able to tolerate diet and no longer complaining of N/V Respiratory system: Clear. No increased work of breathing.  Cardiovascular system: S1 & S2 heard, RRR. No JVD, murmurs, gallops, clicks or pedal edema.  Gastrointestinal system: Abdomen is nondistended, soft and nontender. Normal bowel sounds heard. Healed midline laparotomy scar.  Central nervous system: Alert and oriented. No focal neurological deficits.  Extremities: Symmetric 5 x 5 power.    Discharge Instructions  Discharge Orders   Future Orders Complete By Expires   Diet - low sodium heart healthy  As directed    Discharge instructions  As directed    Comments:     Take medications as prescribed Keep yourself well hydrated Arrange follow up with PCP in 7-10 days Arrange follow up with nephrologist (in no longer than 2 weeks) Follow a low sodium diet       Medication List    STOP taking these medications       amoxicillin 500 MG capsule  Commonly known as:  AMOXIL      TAKE these medications       cefUROXime 500 MG tablet  Commonly known as:  CEFTIN  Take 1 tablet (500 mg total) by mouth 2 (two) times daily with a meal.     citalopram 40 MG tablet  Commonly known as:  CELEXA  Take 40 mg by mouth daily.     omeprazole 40 MG capsule  Commonly known as:  PRILOSEC  Take 1 capsule (40 mg total) by mouth 2 (two) times daily before a meal.     ondansetron 4 MG disintegrating tablet  Commonly known as:  ZOFRAN-ODT  Take 1 tablet (4 mg total) by mouth every 8 (eight) hours as needed for nausea or vomiting.     sodium bicarbonate 650 MG tablet  Take 1 tablet (650 mg total) by mouth 3 (three) times daily.       Allergies  Allergen Reactions  . Lisinopril Cough       Follow-up Information   Follow up with Florina Ou, MD. Schedule an appointment as soon as possible for  a visit in 10 days.   Specialty:  Family Medicine   Contact information:   [address] [place] [address] [phone]       Follow up with GOLDSBOROUGH,KELLIE A, MD. Schedule an appointment as soon as possible for a visit in 2 weeks.   Specialty:  Nephrology   Contact information:   [place] [address] [phone]       The results of significant diagnostics from this hospitalization (including imaging, microbiology, ancillary and laboratory) are listed below for reference.    Significant Diagnostic Studies: Dg Abd Acute W/chest  06/25/2013   CLINICAL DATA:  Abdominal pain.  Fatigue.  Bladder cancer.  EXAM: ACUTE ABDOMEN SERIES (ABDOMEN 2 VIEW & CHEST 1 VIEW)  COMPARISON:  DG CHEST 2 VIEW dated 05/24/2013; CT ABD/PELV WO CM dated 05/21/2013  FINDINGS: The lungs appear clear.  Cardiac and mediastinal contours normal.  No pleural effusion identified.  No free intraperitoneal gas. Clips project over the anatomic  pelvis. Bowel gas pattern unremarkable. No significant abnormal air-fluid levels. 3 mm right kidney lower pole nonobstructive calculus.  Pars defects are visible at L4.  IMPRESSION: 1. 3 mm right kidney lower pole calculus. 2. 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   Electronically Signed   By: Sherryl Barters M.D.   On: 06/25/2013 20:28    Microbiology: Recent Results (from the past 240 hour(s))  URINE CULTURE     Status: None   Collection Time    06/26/13  5:52 PM      Result Value Ref Range Status   Specimen Description URINE, CLEAN CATCH   Final   Special Requests rocephin Normal   Final   Culture  Setup Time     Final   Value: 06/26/2013 21:45     Performed at [facility]     Final   Value: 10,000 COLONIES/ML     Performed at [facility]   Culture     Final   Value: Multiple bacterial morphotypes present, none predominant. Suggest appropriate recollection if clinically indicated.     Performed at  [facility]   Report Status 06/27/2013 FINAL   Final     Labs: Basic Metabolic Panel:  Recent Labs Lab 06/25/13 1950 06/26/13 0414 06/26/13 1659 06/27/13 0410 06/28/13 0353  NA 128* 133* 132* 134* 134*  K 4.1 4.0 3.5* 3.0* 3.2*  CL 103 109 103 103 104  CO2 9* 11* 15* 18* 19  GLUCOSE 106* 101* 95 93 102*  BUN 68* 69* 65* 63* 57*  CREATININE 2.54* 2.40* 2.36* 2.19* 2.24*  CALCIUM 12.6* 11.7* 11.0* 10.4 10.3  MG  --   --   --  2.3  --   PHOS  --   --   --  3.2  --    Liver Function Tests:  Recent Labs Lab 06/25/13 1950  AST 11  ALT 14  ALKPHOS 73  BILITOT <0.2*  PROT 8.3  ALBUMIN 3.2*    Recent Labs Lab 06/25/13 1950  LIPASE 96*   CBC:  Recent Labs Lab 06/25/13 1950 06/27/13 0410 06/28/13 0353  WBC 14.3* 8.3 9.3  NEUTROABS 11.4*  --   --   HGB 11.0* 8.3* 8.6*  HCT 32.3* 24.3* 25.2*  MCV 81.8 79.7 79.7  PLT 424* 321 357   CBG:  Recent Labs Lab 06/27/13 1156 06/27/13 1707 06/27/13 2243 06/28/13 0742 06/28/13 1151  GLUCAP 193* 105* 181* 92 153*    Signed:  Barton Dubois  [facility] 06/28/2013, 12:04 PM

## 2013-07-24 ENCOUNTER — Ambulatory Visit (INDEPENDENT_AMBULATORY_CARE_PROVIDER_SITE_OTHER): Payer: BC Managed Care – PPO | Admitting: Emergency Medicine

## 2013-07-24 VITALS — BP 114/60 | HR 105 | Temp 98.0°F | Resp 16 | Ht 70.0 in | Wt 183.0 lb

## 2013-07-24 DIAGNOSIS — R300 Dysuria: Secondary | ICD-10-CM

## 2013-07-24 DIAGNOSIS — N390 Urinary tract infection, site not specified: Secondary | ICD-10-CM

## 2013-07-24 LAB — POCT UA - MICROSCOPIC ONLY
CASTS, UR, LPF, POC: NEGATIVE
Crystals, Ur, HPF, POC: NEGATIVE
Epithelial cells, urine per micros: NEGATIVE
Mucus, UA: NEGATIVE
YEAST UA: NEGATIVE

## 2013-07-24 LAB — POCT CBC
Granulocyte percent: 85.1 %G — AB (ref 37–80)
HEMATOCRIT: 28.4 % — AB (ref 43.5–53.7)
Hemoglobin: 8.9 g/dL — AB (ref 14.1–18.1)
Lymph, poc: 2 (ref 0.6–3.4)
MCH, POC: 26 pg — AB (ref 27–31.2)
MCHC: 31.3 g/dL — AB (ref 31.8–35.4)
MCV: 83 fL (ref 80–97)
MID (cbc): 0.8 (ref 0–0.9)
MPV: 8.7 fL (ref 0–99.8)
POC Granulocyte: 15.7 — AB (ref 2–6.9)
POC LYMPH PERCENT: 10.8 %L (ref 10–50)
POC MID %: 4.1 % (ref 0–12)
Platelet Count, POC: 384 10*3/uL (ref 142–424)
RBC: 3.42 M/uL — AB (ref 4.69–6.13)
RDW, POC: 16.1 %
WBC: 18.5 10*3/uL — AB (ref 4.6–10.2)

## 2013-07-24 LAB — POCT URINALYSIS DIPSTICK
Bilirubin, UA: NEGATIVE
Glucose, UA: NEGATIVE
KETONES UA: NEGATIVE
Nitrite, UA: NEGATIVE
PH UA: 8.5
Spec Grav, UA: 1.015
UROBILINOGEN UA: 0.2

## 2013-07-24 MED ORDER — LEVOFLOXACIN 500 MG PO TABS: 500.0000 mg | ORAL_TABLET | Freq: Every day | ORAL | Status: AC

## 2013-07-24 NOTE — Patient Instructions (Signed)
Urinary Tract Infection  Urinary tract infections (UTIs) can develop anywhere along your urinary tract. Your urinary tract is your body's drainage system for removing wastes and extra water. Your urinary tract includes two kidneys, two ureters, a bladder, and a urethra. Your kidneys are a pair of bean-shaped organs. Each kidney is about the size of your fist. They are located below your ribs, one on each side of your spine.  CAUSES  Infections are caused by microbes, which are microscopic organisms, including fungi, viruses, and bacteria. These organisms are so small that they can only be seen through a microscope. Bacteria are the microbes that most commonly cause UTIs.  SYMPTOMS   Symptoms of UTIs may vary by age and gender of the patient and by the location of the infection. Symptoms in young women typically include a frequent and intense urge to urinate and a painful, burning feeling in the bladder or urethra during urination. Older women and men are more likely to be tired, shaky, and weak and have muscle aches and abdominal pain. A fever may mean the infection is in your kidneys. Other symptoms of a kidney infection include pain in your back or sides below the ribs, nausea, and vomiting.  DIAGNOSIS  To diagnose a UTI, your caregiver will ask you about your symptoms. Your caregiver also will ask to provide a urine sample. The urine sample will be tested for bacteria and white blood cells. White blood cells are made by your body to help fight infection.  TREATMENT   Typically, UTIs can be treated with medication. Because most UTIs are caused by a bacterial infection, they usually can be treated with the use of antibiotics. The choice of antibiotic and length of treatment depend on your symptoms and the type of bacteria causing your infection.  HOME CARE INSTRUCTIONS   If you were prescribed antibiotics, take them exactly as your caregiver instructs you. Finish the medication even if you feel better after you  have only taken some of the medication.   Drink enough water and fluids to keep your urine clear or pale yellow.   Avoid caffeine, tea, and carbonated beverages. They tend to irritate your bladder.   Empty your bladder often. Avoid holding urine for long periods of time.   Empty your bladder before and after sexual intercourse.   After a bowel movement, women should cleanse from front to back. Use each tissue only once.  SEEK MEDICAL CARE IF:    You have back pain.   You develop a fever.   Your symptoms do not begin to resolve within 3 days.  SEEK IMMEDIATE MEDICAL CARE IF:    You have severe back pain or lower abdominal pain.   You develop chills.   You have nausea or vomiting.   You have continued burning or discomfort with urination.  MAKE SURE YOU:    Understand these instructions.   Will watch your condition.   Will get help right away if you are not doing well or get worse.  Document Released: 12/30/2004 Document Revised: 09/21/2011 Document Reviewed: 04/30/2011  [employer] Patient Information 2014 [employer]

## 2013-07-24 NOTE — Progress Notes (Signed)
[facility] [address] [place] [address] [phone]  Date:  07/24/2013   Name:  [name] [name]   DOB:  07/16/1960   MRN:  [id number]  PCP:  Florina Ou, MD    Chief Complaint: Dysuria   History of Present Illness:  [name] [name] is a 53 y.o. very pleasant male patient who presents with the following:  Underwent a cystectomy and creation of an ileal conduit in January.  Says he has been weak all week with nausea and vomiting.  No fever or chills.  No stool change. No abdominal pain.   No improvement with over the counter medications or other home remedies. Denies other complaint or health concern today.  Last admitted to the hospital a month ago for similar symptoms.  Refuses to consider readmission    Patient Active Problem List   Diagnosis Date Noted  . Hypercalcemia 06/26/2013  . Nausea and vomiting 06/26/2013  . Metabolic acidosis with normal anion gap and bicarbonate losses 06/25/2013  . UTI (lower urinary tract infection) 06/25/2013  . Acute-on-chronic kidney injury 06/25/2013  . Pyelonephritis 05/09/2013  . SIRS (systemic inflammatory response syndrome) 05/09/2013  . HTN (hypertension) 05/09/2013  . Bladder cancer 05/09/2013  . Tachycardia 05/09/2013    Past Medical History  Diagnosis Date  . Gout     recent flair -bilateral feet--  STABLE  . Frequency of urination   . Urgency incontinence   . Nocturia   . Diabetes mellitus type 2, diet-controlled PT STOPPED TAKING METFORMIN--  HAS BEEN WATCHING DIET, EXERCISING AND LOSING WT  . Bladder cancer 2013  . Complication of anesthesia     agitation w/awakening in 9/13,OK 01/24/12  . Hypertension   . Anxiety   . Depression   . GERD (gastroesophageal reflux disease)     Past Surgical History  Procedure Laterality Date  . [place] artery repair  2009    rt -trauma /thrombectomy,repair  . Transurethral resection of bladder tumor  12/15/2011    Procedure: TRANSURETHRAL RESECTION OF  BLADDER TUMOR (TURBT);  Surgeon: Molli Hazard, MD;  Location: [facility] ([place]  Service: Urology;  Laterality: N/A;  2 HRS Procedure reads: cysto, TURBT , Bilateral retrograde pyelograms, poss bilateral ureteroscopy with biopsy and ureter stent placement   . Lumbar laminectomy  06-02-2005    W/ RESECTION NERVE ROOT  L4  -  L5  . Transurethral resection of bladder tumor  01/24/2012    Procedure: TRANSURETHRAL RESECTION OF BLADDER TUMOR (TURBT);  Surgeon: Molli Hazard, MD;  Location: [facility];  Service: Urology;  Laterality: N/A;  90 MIN ADD LEFT URETER BIOPSY TO PROCEDURE    . Cystoscopy with urethral dilatation  01/24/2012    Procedure: CYSTOSCOPY WITH URETHRAL DILATATION;  Surgeon: Molli Hazard, MD;  Location: [facility];  Service: Urology;  Laterality: N/A;  . Transurethral resection of bladder tumor N/A 07/10/2012    Procedure: TRANSURETHRAL RESECTION OF BLADDER TUMOR (TURBT);  Surgeon: Molli Hazard, MD;  Location: [facility];  Service: Urology;  Laterality: N/A;  . Cystoscopy w/ retrogrades Bilateral 07/10/2012    Procedure: CYSTOSCOPY WITH RETROGRADE PYELOGRAM;  Surgeon: Molli Hazard, MD;  Location: [facility];  Service: Urology;  Laterality: Bilateral;    History  Substance Use Topics  . Smoking status: Former Smoker -- 1.00 packs/day for 16 years    Types: Cigarettes    Quit date: 01/20/1992  . Smokeless tobacco:  Former User    Types: Chew    Quit date: 01/20/1992  . Alcohol Use: No    Family History  Problem Relation Age of Onset  . Diabetes Mellitus II Other   . Hypertension Other   . Bladder Cancer Neg Hx     Allergies  Allergen Reactions  . Lisinopril Cough    Medication list has been reviewed and updated.  Current Outpatient Prescriptions on File Prior to Visit  Medication Sig Dispense Refill  . citalopram (CELEXA) 40 MG tablet Take 40 mg by  mouth daily.      . sodium bicarbonate 650 MG tablet Take 1 tablet (650 mg total) by mouth 3 (three) times daily.  90 tablet  1  . omeprazole (PRILOSEC) 40 MG capsule Take 1 capsule (40 mg total) by mouth 2 (two) times daily before a meal.  60 capsule  1  . ondansetron (ZOFRAN-ODT) 4 MG disintegrating tablet Take 1 tablet (4 mg total) by mouth every 8 (eight) hours as needed for nausea or vomiting.  20 tablet  0   Current Facility-Administered Medications on File Prior to Visit  Medication Dose Route Frequency Provider Last Rate Last Dose  . levofloxacin (LEVAQUIN) IVPB 500 mg  500 mg Intravenous Q24H Molli Hazard, MD        Review of Systems:  As per HPI, otherwise negative.    Physical Examination: Filed Vitals:   07/24/13 1920  BP: 114/60  Pulse: 105  Temp: 98 F (36.7 C)  Resp: 16   Filed Vitals:   07/24/13 1920  Height: 5\' 10"  (1.778 m)  Weight: 183 lb (83.008 kg)   Body mass index is 26.26 kg/(m^2). Ideal Body Weight: Weight in (lb) to have BMI = 25: 173.9  GEN: WDWN, NAD, Non-toxic, A & O x 3 HEENT: Atraumatic, Normocephalic. Neck supple. No masses, No LAD. Ears and Nose: No external deformity. CV: RRR, No M/G/R. No JVD. No thrill. No extra heart sounds. PULM: CTA B, no wheezes, crackles, rhonchi. No retractions. No resp. distress. No accessory muscle use. ABD: S, NT, ND, +BS. No rebound. No HSM. EXTR: No c/c/e NEURO Normal gait.  PSYCH: Normally interactive. Conversant. Not depressed or anxious appearing.  Calm demeanor.    Assessment and Plan: Urinary tract infection Refuses to consider readmission to the hospital.  Very vague about past history, current problems or plans for further care  Signed,  Ellison Carwin, MD   Results for orders placed in visit on 07/24/13  POCT CBC      Result Value Ref Range   WBC 18.5 (*) 4.6 - 10.2 K/uL   Lymph, poc 2.0  0.6 - 3.4   POC LYMPH PERCENT 10.8  10 - 50 %L   MID (cbc) 0.8  0 - 0.9   POC MID % 4.1  0  - 12 %M   POC Granulocyte 15.7 (*) 2 - 6.9   Granulocyte percent 85.1 (*) 37 - 80 %G   RBC 3.42 (*) 4.69 - 6.13 M/uL   Hemoglobin 8.9 (*) 14.1 - 18.1 g/dL   HCT, POC 28.4 (*) 43.5 - 53.7 %   MCV 83.0  80 - 97 fL   MCH, POC 26.0 (*) 27 - 31.2 pg   MCHC 31.3 (*) 31.8 - 35.4 g/dL   RDW, POC 16.1     Platelet Count, POC 384  142 - 424 K/uL   MPV 8.7  0 - 99.8 fL  POCT URINALYSIS DIPSTICK  Result Value Ref Range   Color, UA yellow     Clarity, UA turbid     Glucose, UA negative     Bilirubin, UA negative     Ketones, UA negative     Spec Grav, UA 1.015     Blood, UA moderate     pH, UA 8.5     Protein, UA >=300     Urobilinogen, UA 0.2     Nitrite, UA negative     Leukocytes, UA large (3+)    POCT UA - MICROSCOPIC ONLY      Result Value Ref Range   WBC, Ur, HPF, POC 7-12     RBC, urine, microscopic 7-9     Bacteria, U Microscopic 4+     Mucus, UA negative     Epithelial cells, urine per micros negative     Crystals, Ur, HPF, POC negative     Casts, Ur, LPF, POC negative     Yeast, UA negative

## 2013-07-25 LAB — COMPREHENSIVE METABOLIC PANEL
ALBUMIN: 3.3 g/dL — AB (ref 3.5–5.2)
ALT: 10 U/L (ref 0–53)
AST: 10 U/L (ref 0–37)
Alkaline Phosphatase: 77 U/L (ref 39–117)
BILIRUBIN TOTAL: 0.3 mg/dL (ref 0.2–1.2)
BUN: 54 mg/dL — ABNORMAL HIGH (ref 6–23)
CO2: 15 mEq/L — ABNORMAL LOW (ref 19–32)
Calcium: 9.8 mg/dL (ref 8.4–10.5)
Chloride: 103 mEq/L (ref 96–112)
Creat: 2.56 mg/dL — ABNORMAL HIGH (ref 0.50–1.35)
Glucose, Bld: 127 mg/dL — ABNORMAL HIGH (ref 70–99)
POTASSIUM: 4.4 meq/L (ref 3.5–5.3)
Sodium: 125 mEq/L — ABNORMAL LOW (ref 135–145)
TOTAL PROTEIN: 6.6 g/dL (ref 6.0–8.3)

## 2013-07-27 LAB — URINE CULTURE

## 2013-08-29 ENCOUNTER — Emergency Department (HOSPITAL_COMMUNITY)
Admission: EM | Admit: 2013-08-29 | Discharge: 2013-08-30 | Disposition: A | Discharge status: Home / Self Care | Payer: BC Managed Care – PPO | Attending: Emergency Medicine | Admitting: Emergency Medicine

## 2013-08-29 ENCOUNTER — Encounter: Payer: Self-pay | Admitting: Family Medicine

## 2013-08-29 ENCOUNTER — Encounter (HOSPITAL_COMMUNITY): Payer: Self-pay | Admitting: Emergency Medicine

## 2013-08-29 ENCOUNTER — Ambulatory Visit (INDEPENDENT_AMBULATORY_CARE_PROVIDER_SITE_OTHER): Payer: BC Managed Care – PPO | Admitting: Family Medicine

## 2013-08-29 VITALS — BP 128/68 | HR 83 | Temp 98.6°F | Resp 16 | Ht 67.5 in | Wt 173.8 lb

## 2013-08-29 DIAGNOSIS — E119 Type 2 diabetes mellitus without complications: Secondary | ICD-10-CM | POA: Insufficient documentation

## 2013-08-29 DIAGNOSIS — R1110 Vomiting, unspecified: Secondary | ICD-10-CM | POA: Insufficient documentation

## 2013-08-29 DIAGNOSIS — F411 Generalized anxiety disorder: Secondary | ICD-10-CM | POA: Insufficient documentation

## 2013-08-29 DIAGNOSIS — F3289 Other specified depressive episodes: Secondary | ICD-10-CM | POA: Insufficient documentation

## 2013-08-29 DIAGNOSIS — I129 Hypertensive chronic kidney disease with stage 1 through stage 4 chronic kidney disease, or unspecified chronic kidney disease: Secondary | ICD-10-CM | POA: Insufficient documentation

## 2013-08-29 DIAGNOSIS — E871 Hypo-osmolality and hyponatremia: Secondary | ICD-10-CM

## 2013-08-29 DIAGNOSIS — N390 Urinary tract infection, site not specified: Secondary | ICD-10-CM

## 2013-08-29 DIAGNOSIS — K219 Gastro-esophageal reflux disease without esophagitis: Secondary | ICD-10-CM | POA: Insufficient documentation

## 2013-08-29 DIAGNOSIS — F329 Major depressive disorder, single episode, unspecified: Secondary | ICD-10-CM | POA: Insufficient documentation

## 2013-08-29 DIAGNOSIS — D649 Anemia, unspecified: Secondary | ICD-10-CM | POA: Insufficient documentation

## 2013-08-29 DIAGNOSIS — N183 Chronic kidney disease, stage 3 unspecified: Secondary | ICD-10-CM

## 2013-08-29 DIAGNOSIS — R300 Dysuria: Secondary | ICD-10-CM

## 2013-08-29 DIAGNOSIS — Z87891 Personal history of nicotine dependence: Secondary | ICD-10-CM | POA: Insufficient documentation

## 2013-08-29 DIAGNOSIS — Z8551 Personal history of malignant neoplasm of bladder: Secondary | ICD-10-CM | POA: Insufficient documentation

## 2013-08-29 DIAGNOSIS — K5900 Constipation, unspecified: Secondary | ICD-10-CM | POA: Insufficient documentation

## 2013-08-29 DIAGNOSIS — Z79899 Other long term (current) drug therapy: Secondary | ICD-10-CM | POA: Insufficient documentation

## 2013-08-29 DIAGNOSIS — N189 Chronic kidney disease, unspecified: Secondary | ICD-10-CM | POA: Insufficient documentation

## 2013-08-29 DIAGNOSIS — C679 Malignant neoplasm of bladder, unspecified: Secondary | ICD-10-CM

## 2013-08-29 DIAGNOSIS — Z792 Long term (current) use of antibiotics: Secondary | ICD-10-CM | POA: Insufficient documentation

## 2013-08-29 LAB — POCT CBC
Granulocyte percent: 73.1 %G (ref 37–80)
HEMATOCRIT: 28.9 % — AB (ref 43.5–53.7)
Hemoglobin: 9 g/dL — AB (ref 14.1–18.1)
LYMPH, POC: 2.2 (ref 0.6–3.4)
MCH, POC: 25.9 pg — AB (ref 27–31.2)
MCHC: 31.1 g/dL — AB (ref 31.8–35.4)
MCV: 83.3 fL (ref 80–97)
MID (cbc): 0.5 (ref 0–0.9)
MPV: 7.8 fL (ref 0–99.8)
POC Granulocyte: 7.2 — AB (ref 2–6.9)
POC LYMPH PERCENT: 22 %L (ref 10–50)
POC MID %: 4.9 %M (ref 0–12)
Platelet Count, POC: 395 10*3/uL (ref 142–424)
RBC: 3.47 M/uL — AB (ref 4.69–6.13)
RDW, POC: 17.2 %
WBC: 9.8 10*3/uL (ref 4.6–10.2)

## 2013-08-29 LAB — CBC WITH DIFFERENTIAL/PLATELET
BASOS ABS: 0 10*3/uL (ref 0.0–0.1)
BASOS PCT: 0 % (ref 0–1)
Eosinophils Absolute: 0.1 10*3/uL (ref 0.0–0.7)
Eosinophils Relative: 1 % (ref 0–5)
HCT: 31.5 % — ABNORMAL LOW (ref 39.0–52.0)
Hemoglobin: 10.5 g/dL — ABNORMAL LOW (ref 13.0–17.0)
Lymphocytes Relative: 22 % (ref 12–46)
Lymphs Abs: 2.2 10*3/uL (ref 0.7–4.0)
MCH: 27.1 pg (ref 26.0–34.0)
MCHC: 33.3 g/dL (ref 30.0–36.0)
MCV: 81.2 fL (ref 78.0–100.0)
Monocytes Absolute: 0.6 10*3/uL (ref 0.1–1.0)
Monocytes Relative: 6 % (ref 3–12)
NEUTROS ABS: 7.2 10*3/uL (ref 1.7–7.7)
Neutrophils Relative %: 71 % (ref 43–77)
PLATELETS: 415 10*3/uL — AB (ref 150–400)
RBC: 3.88 MIL/uL — ABNORMAL LOW (ref 4.22–5.81)
RDW: 16 % — AB (ref 11.5–15.5)
WBC: 10.3 10*3/uL (ref 4.0–10.5)

## 2013-08-29 LAB — URINALYSIS, ROUTINE W REFLEX MICROSCOPIC
Bilirubin Urine: NEGATIVE
Glucose, UA: NEGATIVE mg/dL
Ketones, ur: NEGATIVE mg/dL
NITRITE: NEGATIVE
PH: 8 (ref 5.0–8.0)
Protein, ur: 100 mg/dL — AB
Specific Gravity, Urine: 1.014 (ref 1.005–1.030)
UROBILINOGEN UA: 0.2 mg/dL (ref 0.0–1.0)

## 2013-08-29 LAB — COMPREHENSIVE METABOLIC PANEL
ALK PHOS: 71 U/L (ref 39–117)
ALT: 8 U/L (ref 0–53)
ALT: 9 U/L (ref 0–53)
AST: 7 U/L (ref 0–37)
AST: 10 U/L (ref 0–37)
Albumin: 3.6 g/dL (ref 3.5–5.2)
Albumin: 3.6 g/dL (ref 3.5–5.2)
Alkaline Phosphatase: 60 U/L (ref 39–117)
BUN: 65 mg/dL — ABNORMAL HIGH (ref 6–23)
BUN: 65 mg/dL — ABNORMAL HIGH (ref 6–23)
CALCIUM: 10.1 mg/dL (ref 8.4–10.5)
CHLORIDE: 107 meq/L (ref 96–112)
CO2: 12 meq/L — AB (ref 19–32)
CO2: 13 mEq/L — ABNORMAL LOW (ref 19–32)
Calcium: 11 mg/dL — ABNORMAL HIGH (ref 8.4–10.5)
Chloride: 110 mEq/L (ref 96–112)
Creat: 2.53 mg/dL — ABNORMAL HIGH (ref 0.50–1.35)
Creatinine, Ser: 2.58 mg/dL — ABNORMAL HIGH (ref 0.50–1.35)
GFR calc Af Amer: 31 mL/min — ABNORMAL LOW (ref >90)
GFR calc non Af Amer: 27 mL/min — ABNORMAL LOW (ref >90)
Glucose, Bld: 142 mg/dL — ABNORMAL HIGH (ref 70–99)
Glucose, Bld: 125 mg/dL — ABNORMAL HIGH (ref 70–99)
Potassium: 4.4 mEq/L (ref 3.5–5.3)
Potassium: 4.6 mEq/L (ref 3.7–5.3)
SODIUM: 129 meq/L — AB (ref 135–145)
Sodium: 134 mEq/L — ABNORMAL LOW (ref 137–147)
Total Bilirubin: 0.2 mg/dL (ref 0.2–1.2)
Total Bilirubin: <0.2 mg/dL — ABNORMAL LOW (ref 0.3–1.2)
Total Protein: 7.2 g/dL (ref 6.0–8.3)
Total Protein: 8.4 g/dL — ABNORMAL HIGH (ref 6.0–8.3)

## 2013-08-29 LAB — POCT UA - MICROSCOPIC ONLY
CASTS, UR, LPF, POC: NEGATIVE
Crystals, Ur, HPF, POC: NEGATIVE
Mucus, UA: NEGATIVE
Yeast, UA: NEGATIVE

## 2013-08-29 LAB — POCT URINALYSIS DIPSTICK
GLUCOSE UA: NEGATIVE
Ketones, UA: NEGATIVE
NITRITE UA: NEGATIVE
PH UA: 8.5
Protein, UA: >=300
Spec Grav, UA: 1.02
UROBILINOGEN UA: 0.2

## 2013-08-29 LAB — URINE MICROSCOPIC-ADD ON

## 2013-08-29 MED ORDER — CEFTRIAXONE SODIUM 1 G IJ SOLR
500.0000 mg | Freq: Once | INTRAMUSCULAR | Status: AC
  Administered 2013-08-29: 500 mg via INTRAMUSCULAR

## 2013-08-29 MED ORDER — LEVOFLOXACIN 500 MG PO TABS: 500.0000 mg | ORAL_TABLET | Freq: Every day | ORAL | Status: DC

## 2013-08-29 NOTE — Patient Instructions (Signed)
As you do not want to go to the hospital for this infection tonight, we will start with a shot of an antibiotic, as well as the levaquin pills. Some other tests were sent off stat.  If these levels are concerning - we will call you to be seen in the emergency room tonight, otherwise follow up with me by lunchtime tomorrow. Return to the clinic or go to the nearest emergency room if any of your symptoms worsen or new symptoms occur. Urinary Tract Infection Urinary tract infections (UTIs) can develop anywhere along your urinary tract. Your urinary tract is your body's drainage system for removing wastes and extra water. Your urinary tract includes two kidneys, two ureters, a bladder, and a urethra. Your kidneys are a pair of bean-shaped organs. Each kidney is about the size of your fist. They are located below your ribs, one on each side of your spine. CAUSES Infections are caused by microbes, which are microscopic organisms, including fungi, viruses, and bacteria. These organisms are so small that they can only be seen through a microscope. Bacteria are the microbes that most commonly cause UTIs. SYMPTOMS  Symptoms of UTIs may vary by age and gender of the patient and by the location of the infection. Symptoms in young women typically include a frequent and intense urge to urinate and a painful, burning feeling in the bladder or urethra during urination. Older women and men are more likely to be tired, shaky, and weak and have muscle aches and abdominal pain. A fever may mean the infection is in your kidneys. Other symptoms of a kidney infection include pain in your back or sides below the ribs, nausea, and vomiting. DIAGNOSIS To diagnose a UTI, your caregiver will ask you about your symptoms. Your caregiver also will ask to provide a urine sample. The urine sample will be tested for bacteria and white blood cells. White blood cells are made by your body to help fight infection. TREATMENT  Typically, UTIs  can be treated with medication. Because most UTIs are caused by a bacterial infection, they usually can be treated with the use of antibiotics. The choice of antibiotic and length of treatment depend on your symptoms and the type of bacteria causing your infection. HOME CARE INSTRUCTIONS  If you were prescribed antibiotics, take them exactly as your caregiver instructs you. Finish the medication even if you feel better after you have only taken some of the medication.  Drink enough water and fluids to keep your urine clear or pale yellow.  Avoid caffeine, tea, and carbonated beverages. They tend to irritate your bladder.  Empty your bladder often. Avoid holding urine for long periods of time.  Empty your bladder before and after sexual intercourse.  After a bowel movement, women should cleanse from front to back. Use each tissue only once. SEEK MEDICAL CARE IF:   You have back pain.  You develop a fever.  Your symptoms do not begin to resolve within 3 days. SEEK IMMEDIATE MEDICAL CARE IF:   You have severe back pain or lower abdominal pain.  You develop chills.  You have nausea or vomiting.  You have continued burning or discomfort with urination. MAKE SURE YOU:   Understand these instructions.  Will watch your condition.  Will get help right away if you are not doing well or get worse. Document Released: 12/30/2004 Document Revised: 09/21/2011 Document Reviewed: 04/30/2011 [facility] Patient Information 2014 [employer].

## 2013-08-29 NOTE — ED Notes (Signed)
Patient with critical labs per [facility], Bicarb of 12.

## 2013-08-29 NOTE — Progress Notes (Addendum)
Subjective:    Patient ID: [name] [name], male    DOB: 1961-02-28, 53 y.o.   MRN: [id number] This chart was scribed for [name] Ray, MD by Randa Evens, ED Scribe. This Patient was seen in room 09 and the patients care was started at 6:46 PM  HPI  [name] [name] is a 53 y.o. male PCP: Florina Ou, MD  Here for Dysuria and hematuria States that he feels like he has a bladder infection onset 3 days ago. He states he had associated dysuria, constipation emesis 1x, and cough. He states that he is going back to [place], [place] with urologist on Monday. He denies fever, chills, urinary frequency, new back pain, light headiness, or dizziness.He states it is normal to have discharge when he urinates and state that it has recently changed. He states the discharge had black specs in it. Denies any over the medications for his symptoms.   Admitted 06/25/13- 3-26/15 with UTI,  metabolic acidosis, acute on chronic kidney injury and admitted with suspescted SIRS due to Klebsiella UTI Feb 16 - 20th.  He has complicated uroligical history including recsection of bladder and neo-bladder jan 22 in [place] with ? Ureteral stents placed at that time. States he still has stent back into his R kidney and bladder with external access on right lower back, placed 8 weeks ago. States his wife cleans and injects stent with saline. He states he missed the cleaning Sunday and Monday of this week.   In last hospitalization,  received 4 days rocephin and 10 day total abx course outpatient. Discharge creatinne was 2.2.  he has stage 3 kidney disease at baseline.  He was seen by Dr. Ouida Sills April 2. With wbc at 18.5. Seen at that time with weakness nausea and vomiting. Large LE, 7-12 WBC and 7-9 RBC on urinalysis. recommended readmission to hospital which was refused. Was treated with Levaquin 500 mg/qd for 10 days. Urine culture then with greater than 100K of Ecoli sensitive to Levaquin.   CMP 1 month ago  sodium 25, creatinne 2.56   Patient Active Problem List   Diagnosis Date Noted  . Hypercalcemia 06/26/2013  . Nausea and vomiting 06/26/2013  . Metabolic acidosis with normal anion gap and bicarbonate losses 06/25/2013  . UTI (lower urinary tract infection) 06/25/2013  . Acute-on-chronic kidney injury 06/25/2013  . Pyelonephritis 05/09/2013  . SIRS (systemic inflammatory response syndrome) 05/09/2013  . HTN (hypertension) 05/09/2013  . Bladder cancer 05/09/2013  . Tachycardia 05/09/2013   Past Medical History  Diagnosis Date  . Gout     recent flair -bilateral feet--  STABLE  . Frequency of urination   . Urgency incontinence   . Nocturia   . Diabetes mellitus type 2, diet-controlled PT STOPPED TAKING METFORMIN--  HAS BEEN WATCHING DIET, EXERCISING AND LOSING WT  . Bladder cancer 2013  . Complication of anesthesia     agitation w/awakening in 9/13,OK 01/24/12  . Hypertension   . Anxiety   . Depression   . GERD (gastroesophageal reflux disease)    Past Surgical History  Procedure Laterality Date  . [place] artery repair  2009    rt -trauma /thrombectomy,repair  . Transurethral resection of bladder tumor  12/15/2011    Procedure: TRANSURETHRAL RESECTION OF BLADDER TUMOR (TURBT);  Surgeon: Molli Hazard, MD;  Location: [facility];  Service: Urology;  Laterality: N/A;  2 HRS Procedure reads: cysto, TURBT , Bilateral retrograde pyelograms, poss bilateral ureteroscopy with biopsy and ureter stent placement   .  Lumbar laminectomy  06-02-2005    W/ RESECTION NERVE ROOT  L4  -  L5  . Transurethral resection of bladder tumor  01/24/2012    Procedure: TRANSURETHRAL RESECTION OF BLADDER TUMOR (TURBT);  Surgeon: Molli Hazard, MD;  Location[facility];  Service: Urology;  Laterality: N/A;  90 MIN ADD LEFT URETER BIOPSY TO PROCEDURE    . Cystoscopy with urethral dilatation  01/24/2012    Procedure: CYSTOSCOPY WITH URETHRAL DILATATION;   Surgeon: Molli Hazard, MD;  Location: [facility];  Service: Urology;  Laterality: N/A;  . Transurethral resection of bladder tumor N/A 07/10/2012    Procedure: TRANSURETHRAL RESECTION OF BLADDER TUMOR (TURBT);  Surgeon: Molli Hazard, MD;  Location: [facility];  Service: Urology;  Laterality: N/A;  . Cystoscopy w/ retrogrades Bilateral 07/10/2012    Procedure: CYSTOSCOPY WITH RETROGRADE PYELOGRAM;  Surgeon: Molli Hazard, MD;  Location: [facility];  Service: Urology;  Laterality: Bilateral;   Prior to Admission medications   Medication Sig Start Date End Date Taking? Authorizing Provider  citalopram (CELEXA) 40 MG tablet Take 40 mg by mouth daily.   Yes Historical Provider, MD  omeprazole (PRILOSEC) 40 MG capsule Take 1 capsule (40 mg total) by mouth 2 (two) times daily before a meal. 06/28/13  Yes Barton Dubois, MD  ondansetron (ZOFRAN-ODT) 4 MG disintegrating tablet Take 1 tablet (4 mg total) by mouth every 8 (eight) hours as needed for nausea or vomiting. 06/28/13  Yes Barton Dubois, MD  sodium bicarbonate 650 MG tablet Take 1 tablet (650 mg total) by mouth 3 (three) times daily. 06/28/13   Barton Dubois, MD   Allergies  Allergen Reactions  . Lisinopril Cough   History   Social History  . Marital Status: Married    Spouse Name: N/A    Number of Children: N/A  . Years of Education: N/A   Occupational History  . Not on file.   Social History Main Topics  . Smoking status: Former Smoker -- 1.00 packs/day for 16 years    Types: Cigarettes    Quit date: 01/20/1992  . Smokeless tobacco: Former Systems developer    Types: [place] date: 01/20/1992  . Alcohol Use: No  . Drug Use: No  . Sexual Activity: Not Currently   Other Topics Concern  . Not on file   Social History Narrative  . No narrative on file      Review of Systems  Constitutional: Negative for fever and chills.  Respiratory: Positive for cough.     Gastrointestinal: Positive for vomiting and constipation.  Genitourinary: Positive for dysuria and discharge. Negative for frequency and hematuria.  Musculoskeletal: Negative for back pain.  Neurological: Negative for dizziness and light-headedness.    Objective:   Filed Vitals:   08/29/13 1811  BP: 128/68  Pulse: 83  Temp: 98.6 F (37 C)  TempSrc: Oral  Resp: 16  Height: 5' 7.5" (1.715 m)  Weight: 173 lb 12.8 oz (78.835 kg)  SpO2: 100%     Physical Exam  Nursing note and vitals reviewed. Constitutional: He is oriented to person, place, and time. He appears well-developed and well-nourished. No distress.  HENT:  Head: Normocephalic and atraumatic.  Eyes: EOM are normal.  Neck: Neck supple.  Cardiovascular: Normal rate.   Pulmonary/Chest: Effort normal. No respiratory distress.  Abdominal: Soft. He exhibits no distension. There is no tenderness. There is no CVA tenderness.  Musculoskeletal: Normal range of motion.  He exhibits no edema.  No CVA tenderness, access port with bandage covering on back with no erythema surrounding  Neurological: He is alert and oriented to person, place, and time.  Skin: Skin is warm and dry. No rash noted.  Psychiatric: He has a normal mood and affect. His behavior is normal.   Results for orders placed in visit on 08/29/13  POCT URINALYSIS DIPSTICK      Result Value Ref Range   Color, UA yellow     Clarity, UA cloudy     Glucose, UA neg     Bilirubin, UA small     Ketones, UA neg     Spec Grav, UA 1.020     Blood, UA large     pH, UA 8.5     Protein, UA >=300     Urobilinogen, UA 0.2     Nitrite, UA neg     Leukocytes, UA large (3+)    POCT UA - MICROSCOPIC ONLY      Result Value Ref Range   WBC, Ur, HPF, POC 35-45     RBC, urine, microscopic 15-25     Bacteria, U Microscopic 3+     Mucus, UA neg     Epithelial cells, urine per micros 2-4     Crystals, Ur, HPF, POC neg     Casts, Ur, LPF, POC neg     Yeast, UA neg    POCT  CBC      Result Value Ref Range   WBC 9.8  4.6 - 10.2 K/uL   Lymph, poc 2.2  0.6 - 3.4   POC LYMPH PERCENT 22.0  10 - 50 %L   MID (cbc) 0.5  0 - 0.9   POC MID % 4.9  0 - 12 %M   POC Granulocyte 7.2 (*) 2 - 6.9   Granulocyte percent 73.1  37 - 80 %G   RBC 3.47 (*) 4.69 - 6.13 M/uL   Hemoglobin 9.0 (*) 14.1 - 18.1 g/dL   HCT, POC 28.9 (*) 43.5 - 53.7 %   MCV 83.3  80 - 97 fL   MCH, POC 25.9 (*) 27 - 31.2 pg   MCHC 31.1 (*) 31.8 - 35.4 g/dL   RDW, POC 17.2     Platelet Count, POC 395  142 - 424 K/uL   MPV 7.8  0 - 99.8 fL      Assessment & Plan:   ELIOTT AMOR is a 53 y.o. male Hx of bladder cancer, Dysuria , UTI (urinary tract infection) - Plan: POCT CBC, Urine culture, Comprehensive metabolic panel, cefTRIAXone (ROCEPHIN) injection 500 mg, cefTRIAXone (ROCEPHIN) injection 500 mg, levofloxacin (LEVAQUIN) 500 MG tablet  - complicated urologic history with neo bladder after bladder cancer and stents, and repeat urinary tract infections. Repeat UTI likely from last few days, and possible early pyelo with stent on right side and reported pyuria.  Discussed with his complicated history and prior complications - ER eval for possible hospitalization for this UTI, but initially declined. Agreed to have rocephin and start levaquin with stat CMP. Rocephin total of 1gram given, and start levaquin 500mg  qd with plan on recheck tomorrow    Addendum: stat labs returned.   Acidotic with bicarb of 12 - down from 15 prior. Suspected repeat metabolic acidosis. Called patient and advised to proceed to [facility] for eval. He agreed to this and plans on going to ER, but also noted he has not been taking the bicarb tablets as had  been prescribed since hospital discharge. Will have evaluated in ER.     Hyponatremia - Plan: POCT CBC, Urine culture, Comprehensive metabolic panel - stat CMP tonight - still hyponatremic but improved from prior.   CKD (chronic kidney disease), stage III - Plan: POCT CBC, Urine  culture, Comprehensive metabolic panel - stable creatinine from 1 month ago.   Anemia - d/t chronic dz likely.  Stable from prior.   Plan to eval in ER for possible admission. 10:39 PM Triage nurse advised.    Meds ordered this encounter  Medications  . cefTRIAXone (ROCEPHIN) injection 500 mg    Sig:     Order Specific Question:  Antibiotic Indication:    Answer:  UTI  . cefTRIAXone (ROCEPHIN) injection 500 mg    Sig:     Order Specific Question:  Antibiotic Indication:    Answer:  UTI  . levofloxacin (LEVAQUIN) 500 MG tablet    Sig: Take 1 tablet (500 mg total) by mouth daily.    Dispense:  10 tablet    Refill:  0   Patient Instructions  As you do not want to go to the hospital for this infection tonight, we will start with a shot of an antibiotic, as well as the levaquin pills. Some other tests were sent off stat.  If these levels are concerning - we will call you to be seen in the emergency room tonight, otherwise follow up with me by lunchtime tomorrow. Return to the clinic or go to the nearest emergency room if any of your symptoms worsen or new symptoms occur. Urinary Tract Infection Urinary tract infections (UTIs) can develop anywhere along your urinary tract. Your urinary tract is your body's drainage system for removing wastes and extra water. Your urinary tract includes two kidneys, two ureters, a bladder, and a urethra. Your kidneys are a pair of bean-shaped organs. Each kidney is about the size of your fist. They are located below your ribs, one on each side of your spine. CAUSES Infections are caused by microbes, which are microscopic organisms, including fungi, viruses, and bacteria. These organisms are so small that they can only be seen through a microscope. Bacteria are the microbes that most commonly cause UTIs. SYMPTOMS  Symptoms of UTIs may vary by age and gender of the patient and by the location of the infection. Symptoms in young women typically include a frequent  and intense urge to urinate and a painful, burning feeling in the bladder or urethra during urination. Older women and men are more likely to be tired, shaky, and weak and have muscle aches and abdominal pain. A fever may mean the infection is in your kidneys. Other symptoms of a kidney infection include pain in your back or sides below the ribs, nausea, and vomiting. DIAGNOSIS To diagnose a UTI, your caregiver will ask you about your symptoms. Your caregiver also will ask to provide a urine sample. The urine sample will be tested for bacteria and white blood cells. White blood cells are made by your body to help fight infection. TREATMENT  Typically, UTIs can be treated with medication. Because most UTIs are caused by a bacterial infection, they usually can be treated with the use of antibiotics. The choice of antibiotic and length of treatment depend on your symptoms and the type of bacteria causing your infection. HOME CARE INSTRUCTIONS  If you were prescribed antibiotics, take them exactly as your caregiver instructs you. Finish the medication even if you feel better after you have  only taken some of the medication.  Drink enough water and fluids to keep your urine clear or pale yellow.  Avoid caffeine, tea, and carbonated beverages. They tend to irritate your bladder.  Empty your bladder often. Avoid holding urine for long periods of time.  Empty your bladder before and after sexual intercourse.  After a bowel movement, women should cleanse from front to back. Use each tissue only once. SEEK MEDICAL CARE IF:   You have back pain.  You develop a fever.  Your symptoms do not begin to resolve within 3 days. SEEK IMMEDIATE MEDICAL CARE IF:   You have severe back pain or lower abdominal pain.  You develop chills.  You have nausea or vomiting.  You have continued burning or discomfort with urination. MAKE SURE YOU:   Understand these instructions.  Will watch your  condition.  Will get help right away if you are not doing well or get worse. Document Released: 12/30/2004 Document Revised: 09/21/2011 Document Reviewed: 04/30/2011 [facility] Patient Information 2014 [place].         I personally performed the services described in this documentation, which was scribed in my presence. The recorded information has been reviewed and considered, and addended by me as needed.

## 2013-08-29 NOTE — ED Notes (Signed)
Pt. reports dysuria " burning " onset yesterday , denies fever / no hematuria.

## 2013-08-29 NOTE — ED Provider Notes (Signed)
CSN: [id number]     Arrival date & time 08/29/13  2228 History   None    Chief Complaint  Patient presents with  . Dysuria   HPI  [name] [name] is a 53 y.o. male with a PMH of bladder cancer s/p bladder resection and neo bladder, gout, urinary frequency and incontinence, nocturia, DM, HTN, anxiety, depression and GERD who presents to the ED for evaluation of dysuria. History was provided by the patient. Patient has had dysuria for the past 3 days as well as cloudy urine. Symptoms similar to UTI's in the past. Denies any pain currently. Went to [facility] clinic earlier today and was given IV Rocephin and started on Levaquin. Patient received a call this evening stating his labs were abnormal and he needed to come to the ED for further evaluation and management. He denies any abdominal pain, fever, chills, fatigue, urinary frequency, back pain, light headiness, penile discharge, or testicular pain. He has had one episode of emesis, but has overall had good appetite. Has chronic unchanged constipation. Patient has a hx of bladder cancer with bladder resection and a neo-bladder in 04/2012 with a surgeon in [place]. He also has a ureteral stent in his right kidney which he self injects at home. Patient has a follow-up appointment in [place] on 09/03/13 with his urologist. Patient is physically unable to be sexually active since his bladder surgeries.    Past Medical History  Diagnosis Date  . Gout     recent flair -bilateral feet--  STABLE  . Frequency of urination   . Urgency incontinence   . Nocturia   . Diabetes mellitus type 2, diet-controlled PT STOPPED TAKING METFORMIN--  HAS BEEN WATCHING DIET, EXERCISING AND LOSING WT  . Bladder cancer 2013  . Complication of anesthesia     agitation w/awakening in 9/13,OK 01/24/12  . Hypertension   . Anxiety   . Depression   . GERD (gastroesophageal reflux disease)    Past Surgical History  Procedure Laterality Date  . [place] artery repair  2009    rt  -trauma /thrombectomy,repair  . Transurethral resection of bladder tumor  12/15/2011    Procedure: TRANSURETHRAL RESECTION OF BLADDER TUMOR (TURBT);  Surgeon: Molli Hazard, MD;  Location: [facility];  Service: Urology;  Laterality: N/A;  2 HRS Procedure reads: cysto, TURBT , Bilateral retrograde pyelograms, poss bilateral ureteroscopy with biopsy and ureter stent placement   . Lumbar laminectomy  06-02-2005    W/ RESECTION NERVE ROOT  L4  -  L5  . Transurethral resection of bladder tumor  01/24/2012    Procedure: TRANSURETHRAL RESECTION OF BLADDER TUMOR (TURBT);  Surgeon: Molli Hazard, MD;  Location: [facility];  Service: Urology;  Laterality: N/A;  90 MIN ADD LEFT URETER BIOPSY TO PROCEDURE    . Cystoscopy with urethral dilatation  01/24/2012    Procedure: CYSTOSCOPY WITH URETHRAL DILATATION;  Surgeon: Molli Hazard, MD;  Location: [facility];  Service: Urology;  Laterality: N/A;  . Transurethral resection of bladder tumor N/A 07/10/2012    Procedure: TRANSURETHRAL RESECTION OF BLADDER TUMOR (TURBT);  Surgeon: Molli Hazard, MD;  Location: [facility];  Service: Urology;  Laterality: N/A;  . Cystoscopy w/ retrogrades Bilateral 07/10/2012    Procedure: CYSTOSCOPY WITH RETROGRADE PYELOGRAM;  Surgeon: Molli Hazard, MD;  Location: [facility];  Service: Urology;  Laterality: Bilateral;   Family History  Problem Relation Age of Onset  . Diabetes  Mellitus II Other   . Hypertension Other   . Bladder Cancer Neg Hx    History  Substance Use Topics  . Smoking status: Former Smoker -- 1.00 packs/day for 16 years    Types: Cigarettes    Quit date: 01/20/1992  . Smokeless tobacco: Former Systems developer    Types: [place] date: 01/20/1992  . Alcohol Use: No    Review of Systems  Constitutional: Negative for fever, chills, activity change, appetite change and fatigue.   Gastrointestinal: Positive for vomiting (one episode) and constipation (chronic). Negative for nausea, abdominal pain and diarrhea.  Genitourinary: Positive for dysuria. Negative for urgency, frequency, hematuria, flank pain, decreased urine volume, discharge, penile swelling, scrotal swelling, difficulty urinating, genital sores, penile pain and testicular pain.  Musculoskeletal: Negative for back pain and myalgias.  Neurological: Negative for dizziness, weakness, light-headedness and headaches.    Allergies  Lisinopril  Home Medications   Prior to Admission medications   Medication Sig Start Date End Date Taking? Authorizing Provider  citalopram (CELEXA) 40 MG tablet Take 40 mg by mouth daily.   Yes Historical Provider, MD  omeprazole (PRILOSEC) 40 MG capsule Take 1 capsule (40 mg total) by mouth 2 (two) times daily before a meal. 06/28/13  Yes Barton Dubois, MD  ondansetron (ZOFRAN-ODT) 4 MG disintegrating tablet Take 1 tablet (4 mg total) by mouth every 8 (eight) hours as needed for nausea or vomiting. 06/28/13  Yes Barton Dubois, MD  sodium bicarbonate 650 MG tablet Take 1 tablet (650 mg total) by mouth 3 (three) times daily. 06/28/13  Yes Barton Dubois, MD  levofloxacin (LEVAQUIN) 500 MG tablet Take 1 tablet (500 mg total) by mouth daily. 08/29/13   Wendie Agreste, MD   BP 113/68  Pulse 80  Temp(Src) 97.8 F (36.6 C) (Oral)  Resp 22  Ht 5\' 10"  (1.778 m)  Wt 173 lb (78.472 kg)  BMI 24.82 kg/m2  SpO2 100%  Filed Vitals:   08/30/13 0000 08/30/13 0015 08/30/13 0030 08/30/13 0045  BP: 117/63 105/60 108/69 120/70  Pulse: 73 72 73 71  Temp:      TempSrc:      Resp:      Height:      Weight:      SpO2: 99% 97% 100% 100%    Physical Exam  Nursing note and vitals reviewed. Constitutional: He is oriented to person, place, and time. He appears well-developed and well-nourished. No distress.  Non-toxic  HENT:  Head: Normocephalic and atraumatic.  Right Ear: External ear  normal.  Left Ear: External ear normal.  Nose: Nose normal.  Mouth/Throat: Oropharynx is clear and moist.  Eyes: Conjunctivae are normal. Right eye exhibits no discharge. Left eye exhibits no discharge.  Neck: Normal range of motion. Neck supple.  Cardiovascular: Normal rate, regular rhythm and normal heart sounds.  Exam reveals no gallop and no friction rub.   No murmur heard. Pulmonary/Chest: Effort normal and breath sounds normal. No respiratory distress. He has no wheezes. He has no rales. He exhibits no tenderness.  Abdominal: Soft. He exhibits no distension and no mass. There is no tenderness. There is no rebound and no guarding.  Musculoskeletal: Normal range of motion. He exhibits no edema and no tenderness.  No CVA, lumbar, or flank tenderness bilaterally. Stent tubing present covered in dressing on right thoracic back.   Neurological: He is alert and oriented to person, place, and time.  Skin: Skin is warm and dry. He is not diaphoretic.  ED Course  Procedures (including critical care time) Labs Review Labs Reviewed  URINALYSIS, ROUTINE W REFLEX MICROSCOPIC - Abnormal; Notable for the following:    APPearance TURBID (*)    Hgb urine dipstick LARGE (*)    Protein, ur 100 (*)    Leukocytes, UA LARGE (*)    All other components within normal limits  CBC WITH DIFFERENTIAL - Abnormal; Notable for the following:    RBC 3.88 (*)    Hemoglobin 10.5 (*)    HCT 31.5 (*)    RDW 16.0 (*)    Platelets 415 (*)    All other components within normal limits  URINE MICROSCOPIC-ADD ON - Abnormal; Notable for the following:    Bacteria, UA MANY (*)    Crystals TRIPLE PHOSPHATE CRYSTALS (*)    All other components within normal limits  URINE CULTURE  COMPREHENSIVE METABOLIC PANEL  I-STAT CG4 LACTIC ACID, ED    Imaging Review No results found.   EKG Interpretation None      URINALYSIS, ROUTINE W REFLEX MICROSCOPIC      Result Value Ref Range   Color, Urine YELLOW  YELLOW    APPearance TURBID (*) CLEAR   Specific Gravity, Urine 1.014  1.005 - 1.030   pH 8.0  5.0 - 8.0   Glucose, UA NEGATIVE  NEGATIVE mg/dL   Hgb urine dipstick LARGE (*) NEGATIVE   Bilirubin Urine NEGATIVE  NEGATIVE   Ketones, ur NEGATIVE  NEGATIVE mg/dL   Protein, ur 100 (*) NEGATIVE mg/dL   Urobilinogen, UA 0.2  0.0 - 1.0 mg/dL   Nitrite NEGATIVE  NEGATIVE   Leukocytes, UA LARGE (*) NEGATIVE  CBC WITH DIFFERENTIAL      Result Value Ref Range   WBC 10.3  4.0 - 10.5 K/uL   RBC 3.88 (*) 4.22 - 5.81 MIL/uL   Hemoglobin 10.5 (*) 13.0 - 17.0 g/dL   HCT 31.5 (*) 39.0 - 52.0 %   MCV 81.2  78.0 - 100.0 fL   MCH 27.1  26.0 - 34.0 pg   MCHC 33.3  30.0 - 36.0 g/dL   RDW 16.0 (*) 11.5 - 15.5 %   Platelets 415 (*) 150 - 400 K/uL   Neutrophils Relative % 71  43 - 77 %   Neutro Abs 7.2  1.7 - 7.7 K/uL   Lymphocytes Relative 22  12 - 46 %   Lymphs Abs 2.2  0.7 - 4.0 K/uL   Monocytes Relative 6  3 - 12 %   Monocytes Absolute 0.6  0.1 - 1.0 K/uL   Eosinophils Relative 1  0 - 5 %   Eosinophils Absolute 0.1  0.0 - 0.7 K/uL   Basophils Relative 0  0 - 1 %   Basophils Absolute 0.0  0.0 - 0.1 K/uL  COMPREHENSIVE METABOLIC PANEL      Result Value Ref Range   Sodium 134 (*) 137 - 147 mEq/L   Potassium 4.6  3.7 - 5.3 mEq/L   Chloride 107  96 - 112 mEq/L   CO2 13 (*) 19 - 32 mEq/L   Glucose, Bld 125 (*) 70 - 99 mg/dL   BUN 65 (*) 6 - 23 mg/dL   Creatinine, Ser 2.58 (*) 0.50 - 1.35 mg/dL   Calcium 11.0 (*) 8.4 - 10.5 mg/dL   Total Protein 8.4 (*) 6.0 - 8.3 g/dL   Albumin 3.6  3.5 - 5.2 g/dL   AST 10  0 - 37 U/L   ALT 9  0 -  53 U/L   Alkaline Phosphatase 71  39 - 117 U/L   Total Bilirubin <0.2 (*) 0.3 - 1.2 mg/dL   GFR calc non Af Amer 27 (*) >90 mL/min   GFR calc Af Amer 31 (*) >90 mL/min  URINE MICROSCOPIC-ADD ON      Result Value Ref Range   WBC, UA TOO NUMEROUS TO COUNT  <3 WBC/hpf   RBC / HPF 21-50  <3 RBC/hpf   Bacteria, UA MANY (*) RARE   Crystals TRIPLE PHOSPHATE CRYSTALS (*)  NEGATIVE  I-STAT CG4 LACTIC ACID, ED      Result Value Ref Range   Lactic Acid, Venous 0.80  0.5 - 2.2 mmol/L     MDM   [name] [name] is a 53 y.o. male with a PMH of bladder cancer s/p bladder resection and neo bladder, gout, urinary frequency and incontinence, nocturia, DM, HTN, anxiety, depression and GERD who presents to the ED for evaluation of dysuria. Patient found to have a UTI. Doubt pyelonephritis or systemic infection. No leukocytosis, fever, or tachycardia. Lactic acid WNL. Patient received IV Rocephin and started on Levaquin at the [facility] clinic PTA today. Patient sent to ED for abnormal labs (low bicarbonate). Labs repeated and patient found to have hyponatremia (134), elevated creatinine (2.58), anemia, elevated BUN (65), and low bicarbonate (13) which is the patient's baseline. AG 14 with glucose of 125. Doubt DKA. Patient is on sodium bicarbonate supplements. Patient instructed to continue this as well as his antibiotics for his UTI. Patient to receive another dose of IV Rocephin tomorrow at [facility]. Has follow-up with his urologist on 09/03/13. Patient asymptomatic throughout his ED visit. Strict return precautions discussed. Return precautions, discharge instructions, and follow-up was discussed with the patient before discharge. Patient in agreement with discharge and plan.    Discharge Medication List as of 08/30/2013 12:42 AM      Final impressions: 1. UTI (urinary tract infection)   2. Anemia   3. CKD (chronic kidney disease)       Mercy Moore PA-C   This patient was discussed with Dr. Neva Seat, PA-C 08/31/13 [phone]

## 2013-08-30 ENCOUNTER — Telehealth: Payer: Self-pay | Admitting: Family Medicine

## 2013-08-30 LAB — I-STAT CG4 LACTIC ACID, ED: Lactic Acid, Venous: 0.8 mmol/L (ref 0.5–2.2)

## 2013-08-30 NOTE — Discharge Instructions (Signed)
Continue levaquin and follow-up rocephin tomorrow at [facility] clinic and follow-up with urologist on Monday  Drink plenty of fluids and rest Return to the emergency department if you develop any changing/worsening condition, fever, abdominal pain, difficulty with urination, repeated vomiting, not feeling well, or any other concerns (please read additional information regarding your condition below)    Urinary Tract Infection Urinary tract infections (UTIs) can develop anywhere along your urinary tract. Your urinary tract is your body's drainage system for removing wastes and extra water. Your urinary tract includes two kidneys, two ureters, a bladder, and a urethra. Your kidneys are a pair of bean-shaped organs. Each kidney is about the size of your fist. They are located below your ribs, one on each side of your spine. CAUSES Infections are caused by microbes, which are microscopic organisms, including fungi, viruses, and bacteria. These organisms are so small that they can only be seen through a microscope. Bacteria are the microbes that most commonly cause UTIs. SYMPTOMS  Symptoms of UTIs may vary by age and gender of the patient and by the location of the infection. Symptoms in young women typically include a frequent and intense urge to urinate and a painful, burning feeling in the bladder or urethra during urination. Older women and men are more likely to be tired, shaky, and weak and have muscle aches and abdominal pain. A fever may mean the infection is in your kidneys. Other symptoms of a kidney infection include pain in your back or sides below the ribs, nausea, and vomiting. DIAGNOSIS To diagnose a UTI, your caregiver will ask you about your symptoms. Your caregiver also will ask to provide a urine sample. The urine sample will be tested for bacteria and white blood cells. White blood cells are made by your body to help fight infection. TREATMENT  Typically, UTIs can be treated with medication.  Because most UTIs are caused by a bacterial infection, they usually can be treated with the use of antibiotics. The choice of antibiotic and length of treatment depend on your symptoms and the type of bacteria causing your infection. HOME CARE INSTRUCTIONS  If you were prescribed antibiotics, take them exactly as your caregiver instructs you. Finish the medication even if you feel better after you have only taken some of the medication.  Drink enough water and fluids to keep your urine clear or pale yellow.  Avoid caffeine, tea, and carbonated beverages. They tend to irritate your bladder.  Empty your bladder often. Avoid holding urine for long periods of time.  Empty your bladder before and after sexual intercourse.  After a bowel movement, women should cleanse from front to back. Use each tissue only once. SEEK MEDICAL CARE IF:   You have back pain.  You develop a fever.  Your symptoms do not begin to resolve within 3 days. SEEK IMMEDIATE MEDICAL CARE IF:   You have severe back pain or lower abdominal pain.  You develop chills.  You have nausea or vomiting.  You have continued burning or discomfort with urination. MAKE SURE YOU:   Understand these instructions.  Will watch your condition.  Will get help right away if you are not doing well or get worse. Document Released: 12/30/2004 Document Revised: 09/21/2011 Document Reviewed: 04/30/2011 [facility] Patient Information 2014 [place].  Anemia, Nonspecific Anemia is a condition in which the concentration of red blood cells or hemoglobin in the blood is below normal. Hemoglobin is a substance in red blood cells that carries oxygen to the tissues  of the body. Anemia results in not enough oxygen reaching these tissues.  CAUSES  Common causes of anemia include:   Excessive bleeding. Bleeding may be internal or external. This includes excessive bleeding from periods (in women) or from the intestine.   Poor nutrition.    Chronic kidney, thyroid, and liver disease.  Bone marrow disorders that decrease red blood cell production.  Cancer and treatments for cancer.  HIV, AIDS, and their treatments.  Spleen problems that increase red blood cell destruction.  Blood disorders.  Excess destruction of red blood cells due to infection, medicines, and autoimmune disorders. SIGNS AND SYMPTOMS   Minor weakness.   Dizziness.   Headache.  Palpitations.   Shortness of breath, especially with exercise.   Paleness.  Cold sensitivity.  Indigestion.  Nausea.  Difficulty sleeping.  Difficulty concentrating. Symptoms may occur suddenly or they may develop slowly.  DIAGNOSIS  Additional blood tests are often needed. These help your health care provider determine the best treatment. Your health care provider will check your stool for blood and look for other causes of blood loss.  TREATMENT  Treatment varies depending on the cause of the anemia. Treatment can include:   Supplements of iron, vitamin 123456, or folic acid.   Hormone medicines.   A blood transfusion. This may be needed if blood loss is severe.   Hospitalization. This may be needed if there is significant continual blood loss.   Dietary changes.  Spleen removal. HOME CARE INSTRUCTIONS Keep all follow-up appointments. It often takes many weeks to correct anemia, and having your health care provider check on your condition and your response to treatment is very important. SEEK IMMEDIATE MEDICAL CARE IF:   You develop extreme weakness, shortness of breath, or chest pain.   You become dizzy or have trouble concentrating.  You develop heavy vaginal bleeding.   You develop a rash.   You have bloody or black, tarry stools.   You faint.   You vomit up blood.   You vomit repeatedly.   You have abdominal pain.  You have a fever or persistent symptoms for more than 2 3 days.   You have a fever and your  symptoms suddenly get worse.   You are dehydrated.  MAKE SURE YOU:  Understand these instructions.  Will watch your condition.  Will get help right away if you are not doing well or get worse. Document Released: 04/29/2004 Document Revised: 11/22/2012 Document Reviewed: 09/15/2012 [facility] Patient Information 2014 [place].  Chronic Kidney Disease Chronic kidney disease occurs when the kidneys are damaged over a long period. The kidneys are two organs that lie on either side of the spine between the middle of the back and the front of the abdomen. The kidneys:   Remove wastes and extra water from the blood.   Produce important hormones. These help keep bones strong, regulate blood pressure, and help create red blood cells.   Balance the fluids and chemicals in the blood and tissues. A small amount of kidney damage may not cause problems, but a large amount of damage may make it difficult or impossible for the kidneys to work the way they should. If steps are not taken to slow down the kidney damage or stop it from getting worse, the kidneys may stop working permanently. Most of the time, chronic kidney disease does not go away. However, it can often be controlled, and those with the disease can usually live normal lives. CAUSES  The most common causes  of chronic kidney disease are diabetes and high blood pressure (hypertension). Chronic kidney disease may also be caused by:   Diseases that cause kidneys' filters to become inflamed.   Diseases that affect the immune system.   Genetic diseases.   Medicines that damage the kidneys, such as anti-inflammatory medicines.  Poisoning or exposure to toxic substances.   A reoccurring kidney or urinary infection.   A problem with urine flow. This may be caused by:   Cancer.   Kidney stones.   An enlarged prostate in males. SYMPTOMS  Because the kidney damage in chronic kidney disease occurs slowly, symptoms  develop slowly and may not be obvious until the kidney damage becomes severe. A person may have a kidney disease for years without showing any symptoms. Symptoms can include:   Swelling (edema) of the legs, ankles, or feet.   Tiredness (lethargy).   Nausea or vomiting.   Confusion.   Problems with urination, such as:   Decreased urine production.   Frequent urination, especially at night.   Frequent accidents in children who are potty trained.   Muscle twitches and cramps.   Shortness of breath.  Weakness.   Persistent itchiness.   Loss of appetite.  Metallic taste in the mouth.  Trouble sleeping.  Slowed development in children.  Short stature in children. DIAGNOSIS  Chronic kidney disease may be detected and diagnosed by tests, including blood, urine, imaging, or kidney biopsy tests.  TREATMENT  Most chronic kidney diseases cannot be cured. Treatment usually involves relieving symptoms and preventing or slowing the progression of the disease. Treatment may include:   A special diet. You may need to avoid alcohol and foods thatare salty and high in potassium.   Medicines. These may:   Lower blood pressure.   Relieve anemia.   Relieve swelling.   Protect the bones. HOME CARE INSTRUCTIONS   Follow your prescribed diet.   Only take over-the-counter or prescription medicines as directed by your caregiver.  Do not take any new medicines (prescription, over-the-counter, or nutritional supplements) unless approved by your caregiver. Many medicines can worsen your kidney damage or need to have the dose adjusted.   Quit smoking if you are a smoker. Talk to your caregiver about a smoking cessation program.   Keep all follow-up appointments as directed by your caregiver. SEEK IMMEDIATE MEDICAL CARE IF:  Your symptoms get worse or you develop new symptoms.   You develop symptoms of end-stage kidney disease. These include:   Headaches.    Abnormally dark or light skin.   Numbness in the hands or feet.   Easy bruising.   Frequent hiccups.   Menstruation stops.   You have a fever.   You have decreased urine production.   You havepain or bleeding when urinating. MAKE SURE YOU:  Understand these instructions.  Will watch your condition.  Will get help right away if you are not doing well or get worse. FOR MORE INFORMATION  American Association of Kidney Patients: BombTimer.gl National Kidney Foundation: www.kidney.[place]: https://mathis.com/ Life Options Rehabilitation Program: www.lifeoptions.org and www.kidneyschool.org Document Released: 12/30/2007 Document Revised: 03/08/2012 Document Reviewed: 11/19/2011 [facility] Patient Information 2014 [place], [place].   Emergency Department Resource Guide 1) Find a Doctor and Pay Out of Pocket Although you won't have to find out who is covered by your insurance plan, it is a good idea to ask around and get recommendations. You will then need to call the office and see if the doctor you have chosen  will accept you as a new patient and what types of options they offer for patients who are self-pay. Some doctors offer discounts or will set up payment plans for their patients who do not have insurance, but you will need to ask so you aren't surprised when you get to your appointment.  2) Contact Your Local Health Department Not all health departments have doctors that can see patients for sick visits, but many do, so it is worth a call to see if yours does. If you don't know where your local health department is, you can check in your phone book. The CDC also has a tool to help you locate your state's health department, and many state websites also have listings of all of their local health departments.  3) Find a [facility] If your illness is not likely to be very severe or complicated, you may want to try a walk in clinic. These are popping up all  over the country in pharmacies, drugstores, and shopping centers. They're usually staffed by nurse practitioners or physician assistants that have been trained to treat common illnesses and complaints. They're usually fairly quick and inexpensive. However, if you have serious medical issues or chronic medical problems, these are probably not your best option.  No Primary Care Doctor: - Call Health Connect at  [phone] - they can help you locate a primary care doctor that  accepts your insurance, provides certain services, etc. - Physician Referral Service- [phone]  Chronic Pain Problems: Organization         Address  Phone   Notes  [facility]  [phone] Patients need to be referred by their primary care doctor.   Medication Assistance: Organization         Address  Phone   Notes  [facility] Medication [facility] [address] [place], [place] [address] [phone] --Must be a resident of [facility] -- Must have NO insurance coverage whatsoever (no Medicaid/ Medicare, etc.) -- The pt. MUST have a primary care doctor that directs their care regularly and follows them in the community   MedAssist  [phone]   [employer]  [phone]    Agencies that provide inexpensive medical care: Organization         Address  Phone   Notes  [place]  [phone]   [facility]    [phone]   [facility] [place], [place] [address] [phone]   [place] [address], [place] [phone]   Planned Parenthood    [phone]   [facility]    [phone]   [place] and [address], Center Phone:  [phone], Fax:  [phone] Hours of Operation:  9 am - 6 pm, M-F.  Also accepts Medicaid/Medicare and self-pay.  [facility] for Yonah Gobles, [address], [place] Phone: [phone], Fax: [phone]. Hours of Operation:  8:30 am - 5:30 pm, M-F.  Also accepts Medicaid and self-pay.  [facility] [place] [address], [place] Phone: [phone]   [place], [place], [place] [phone], Ext. [phone] Mondays & Thursdays: 7-9 AM.  First 15 patients are seen on a first come, first serve basis.    [place] Providers:  Organization  Address  Phone   Notes  [facility] [address], [place] [phone] Also accepts self-pay patients.  [facility] [id number] [place], [place]  [phone]   [place], [address], [place] [phone]   [facility] [address], [place] [phone]   Lucianne Lei [address], [place]   [phone] Only accepts [place] Access [place] patients after they have their name applied to their card.   Self-Pay (no insurance) in [facility]:  Organization         Address  Phone   Notes  Sickle Cell Patients, [facility] [place] [phone]   [facility] Urgent Care [place] [phone]   [facility] [place]  [place], [place], [place] [phone]   [facility]/Dr. Osei-Bonsu  [address] [place] or [address], [place] [phone] Phone number for both [place] and [place] locations is the same.  Urgent Medical and [facility] [address], [place] [phone]   [facility] [address], [place] or [address] [phone]   [facility] [address] [place] [phone], phone; [phone], fax Sees patients 1st and 3rd Saturday of every  month.  Must not qualify for public or private insurance (i.e. Medicaid, Medicare, [place] Health Choice, Veterans' Benefits)  Household income should be no more than 200% of the poverty level The clinic cannot treat you if you are pregnant or think you are pregnant  Sexually transmitted diseases are not treated at the clinic.    Dental Care: Organization         Address  Phone  Notes  [facility] Department of [facility] [place] [phone] Accepts children up to age 55 who are enrolled in [place] or [place]; pregnant women with a Medicaid card; and children who have applied for Medicaid or [place] Health Choice, but were declined, whose parents can pay a reduced fee at time of service.  [facility] Department of [facility]  [address], [place] [phone] Accepts children up to age 44 who are enrolled in [place] or [place]; pregnant women with a Medicaid card; and children who have applied for Medicaid or [place] Health Choice, but were declined, whose parents can pay a reduced fee at time of service.  [place] Adult Dental Access PROGRAM  [place] [phone] Patients are seen by appointment only. Walk-ins are not accepted. [place] will see patients 71 years of age and older. Monday - Tuesday (8am-5pm) Most Wednesdays (8:30-5pm) $30 per visit, cash only  [facility] Adult [facility] Access PRO[facility]  [address], [facility] [phone] Patients are seen by appointment only. Walk-ins are not accepted. [place] will see patients 95 years of age and older. One Wednesday Evening (Monthly: Volunteer Based).  $30 per visit, cash only  [place]  [phone] for adults; Children under age 20, call Graduate Pediatric Dentistry at [phone]. Children aged 46-14, please call [phone] to request a pediatric application.  Dental  services are provided in all areas of dental care including  fillings, crowns and bridges, complete and partial dentures, implants, gum treatment, root canals, and extractions. Preventive care is also provided. Treatment is provided to both adults and children. Patients are selected via a lottery and there is often a waiting list.   [facility] [address] [place]  [phone] www.drcivils.com   [facility] [address] [place], [place] [phone], Ext. [phone] Second and Fourth Thursday of each month, opens at 6:30 AM; Clinic ends at 9 AM.  Patients are seen on a first-come first-served basis, and a limited number are seen during each clinic.   [facility]  [address] [place]ard Danker [place], [place] [phone]   Eligibility Requirements You must have lived in [place], [place], or [place] counties for at least the last three months.   You cannot be eligible for state or federal sponsored [employer], including [employer], [place], or [employer].   You generally cannot be eligible for healthcare insurance through your employer.    How to apply: Eligibility screenings are held every Tuesday and Wednesday afternoon from 1:00 pm until 4:00 pm. You do not need an appointment for the interview!  [facility] [address], [place], [place]   [place]  [place] Department  [place]  [phone]    Behavioral Health Resources in the Community: Intensive Outpatient Programs Organization         Address  Phone  Notes  [place]ita. [address], [place], [place] [phone]   [facility] Outpatient [address], [place], [place]   ADS: Alcohol & Drug Svcs [address], [place], [place]   [place] [address]    [place], [place] or [phone]   Substance Abuse Resources Organization         Address  Phone  Notes  Alcohol and Drug Services  [phone]   [place]  [phone]   [facility] [place]   Chinita Pester  [phone]   Residential & Outpatient Substance Abuse Program  [phone]   Psychological Services Organization         Address  Phone  Notes  [facility] [place]  [place]  [phone]   [place] [address], [place] or [phone]    Mobile Crisis Teams Organization         Address  Phone  Notes  [facility], Mobile Crisis Care Unit  [phone]   [facility]  [address] [place], [place]   [place] Levels [address] [place] [phone]    Self-Help/Support Groups Organization         Address  Phone             Notes  [place]. of [place] - variety of support groups  [place] Call for more information  Narcotics Anonymous (NA), [facility] [address], [employer] [place]  2 meetings at this location   Special educational needs teacher         Address  Phone  Notes  [facility] [place],    [place]  [place]  [address] [place] [phone], [place], [place]   [place],  [place] [phone] Admissions: 8am-3pm M-F  Incentives Substance [place] [address],    [place], [place] [phone]   [facility] [address] [place], [place], [place]   [facility] [address],  [place], [place]   [facility] - Intensive Outpatient [address] Krist[address], [place], [place]   [facility] ([place].) [place].,  [place], [place]  [phone])[phone] or [phone]   [facility] (RTS) [address] [place], [place] Accepts Medicaid  [facility] [place] [address]  [place] [phone] Substance Abuse/Addiction Treatment   [facility] Organization         Address  Phone  Notes  [facility]  [phone]   Domenic Schwab, PhD [address] [place], [place]   [phone] or [phone]   [place], [place] [phone]   [facility] [address] [place], [place] [phone] Insurance/Medicaid/sponsorship through [facility] Families [address] [place]                                    [place], [place] [phone] [place] [address], [place] [phone]    Dr. Adele Schilder  [phone]   [facility] Dept. 1) [address] [place] 2) [place] 3)  [place] [address], [place] [phone]  [phone]   [facility] [place] [phone] or [phone] (After Hours)

## 2013-08-30 NOTE — Telephone Encounter (Signed)
Per ER note, pt is supposed to f/u with [place] today for a repeat Rocephin injection. Tried to call pt and it went straight to VM. Left urgent message to call me.

## 2013-08-30 NOTE — Telephone Encounter (Signed)
Please call and check status of patient. Seen last night here with rocephin and then sent to ER for UTI, and acidosis. Plan to follow up here today and has not been seen - is he coming in tonight?

## 2013-08-30 NOTE — ED Notes (Signed)
Pt A&OX4, ambulatory at discharge with slow steady gait, verbalizing no complaints at this time. NAD.

## 2013-08-31 LAB — URINE CULTURE: Colony Count: >=100000

## 2013-08-31 NOTE — Telephone Encounter (Signed)
Spoke with pt. He says he is feeling much better. No urinary sxs (burning, discomfort, frequency, urgency). He says because he is feeling much better, he didn't want to come in for another injection, that if it was ok with you, he would rather just finish the Levaquin. I told him I would talk to you and then call him back.

## 2013-08-31 NOTE — Telephone Encounter (Signed)
Pt will be RTC this weekend for the repeat bloodwork. He states he has restarted bicarb. He understands to RTC sooner or ED if worsening.

## 2013-08-31 NOTE — Telephone Encounter (Signed)
Glad to hear he is feeling better.  Did he restart his bicarb? One of the reasons I sent him to the ER was because of his low bicarb/co2.  I know he has a follow up with his urologist in 3 days, but would be a good idea to repeat some electrolytes prior to that time. Can see any provider this weekend for repeat lytes and possibly repeat urine test.  Sooner or to ER if worsening.

## 2013-09-01 LAB — URINE CULTURE: Colony Count: >=100000

## 2013-09-01 NOTE — ED Provider Notes (Signed)
Medical screening examination/treatment/procedure(s) were performed by non-physician practitioner and as supervising physician I was immediately available for consultation/collaboration.   EKG Interpretation None        Wynetta Fines, MD 09/01/13 2244

## 2013-09-21 ENCOUNTER — Other Ambulatory Visit (HOSPITAL_COMMUNITY): Payer: Self-pay

## 2013-09-24 ENCOUNTER — Encounter (HOSPITAL_COMMUNITY)
Admission: RE | Admit: 2013-09-24 | Discharge: 2013-09-24 | Disposition: A | Discharge status: Home / Self Care | Payer: BC Managed Care – PPO | Source: Ambulatory Visit | Attending: Nephrology | Admitting: Nephrology

## 2013-09-24 DIAGNOSIS — D638 Anemia in other chronic diseases classified elsewhere: Secondary | ICD-10-CM | POA: Diagnosis present

## 2013-09-24 DIAGNOSIS — N185 Chronic kidney disease, stage 5: Secondary | ICD-10-CM | POA: Diagnosis not present

## 2013-09-24 LAB — POCT HEMOGLOBIN-HEMACUE: HEMOGLOBIN: 9.6 g/dL — AB (ref 13.0–17.0)

## 2013-09-24 MED ORDER — EPOETIN ALFA 20000 UNIT/ML IJ SOLN
20000.0000 [IU] | INTRAMUSCULAR | Status: DC
  Administered 2013-09-24: 20000 [IU] via SUBCUTANEOUS

## 2013-09-24 MED ORDER — SODIUM CHLORIDE 0.9 % IV SOLN
1020.0000 mg | Freq: Once | INTRAVENOUS | Status: AC
  Administered 2013-09-24: 1020 mg via INTRAVENOUS
  Filled 2013-09-24: qty 34

## 2013-09-24 MED ORDER — EPOETIN ALFA 20000 UNIT/ML IJ SOLN
INTRAMUSCULAR | Status: AC
  Administered 2013-09-24: 20000 [IU] via SUBCUTANEOUS
  Filled 2013-09-24: qty 1

## 2013-09-24 NOTE — Discharge Instructions (Signed)

## 2013-10-08 ENCOUNTER — Encounter (HOSPITAL_COMMUNITY)
Admission: RE | Admit: 2013-10-08 | Discharge: 2013-10-08 | Disposition: A | Discharge status: Home / Self Care | Payer: BC Managed Care – PPO | Source: Ambulatory Visit | Attending: Nephrology | Admitting: Nephrology

## 2013-10-08 DIAGNOSIS — Z8551 Personal history of malignant neoplasm of bladder: Secondary | ICD-10-CM | POA: Insufficient documentation

## 2013-10-08 DIAGNOSIS — D638 Anemia in other chronic diseases classified elsewhere: Secondary | ICD-10-CM | POA: Insufficient documentation

## 2013-10-08 DIAGNOSIS — E119 Type 2 diabetes mellitus without complications: Secondary | ICD-10-CM | POA: Diagnosis not present

## 2013-10-08 DIAGNOSIS — N189 Chronic kidney disease, unspecified: Secondary | ICD-10-CM | POA: Insufficient documentation

## 2013-10-08 DIAGNOSIS — K219 Gastro-esophageal reflux disease without esophagitis: Secondary | ICD-10-CM | POA: Diagnosis not present

## 2013-10-08 DIAGNOSIS — I129 Hypertensive chronic kidney disease with stage 1 through stage 4 chronic kidney disease, or unspecified chronic kidney disease: Secondary | ICD-10-CM | POA: Insufficient documentation

## 2013-10-08 DIAGNOSIS — E872 Acidosis, unspecified: Secondary | ICD-10-CM | POA: Diagnosis not present

## 2013-10-08 LAB — POCT HEMOGLOBIN-HEMACUE: Hemoglobin: 11.4 g/dL — ABNORMAL LOW (ref 13.0–17.0)

## 2013-10-08 MED ORDER — EPOETIN ALFA 20000 UNIT/ML IJ SOLN
20000.0000 [IU] | INTRAMUSCULAR | Status: DC
  Administered 2013-10-08: 20000 [IU] via SUBCUTANEOUS

## 2013-10-08 MED ORDER — EPOETIN ALFA 20000 UNIT/ML IJ SOLN
INTRAMUSCULAR | Status: AC
  Administered 2013-10-08: 20000 [IU] via SUBCUTANEOUS
  Filled 2013-10-08: qty 1

## 2013-10-23 ENCOUNTER — Encounter (HOSPITAL_COMMUNITY)
Admission: RE | Admit: 2013-10-23 | Discharge: 2013-10-23 | Disposition: A | Discharge status: Home / Self Care | Payer: BC Managed Care – PPO | Source: Ambulatory Visit | Attending: Nephrology | Admitting: Nephrology

## 2013-10-23 DIAGNOSIS — D638 Anemia in other chronic diseases classified elsewhere: Secondary | ICD-10-CM | POA: Diagnosis not present

## 2013-10-23 LAB — POCT HEMOGLOBIN-HEMACUE: Hemoglobin: 10.8 g/dL — ABNORMAL LOW (ref 13.0–17.0)

## 2013-10-23 MED ORDER — EPOETIN ALFA 20000 UNIT/ML IJ SOLN
INTRAMUSCULAR | Status: AC
  Filled 2013-10-23: qty 1

## 2013-10-23 MED ORDER — EPOETIN ALFA 20000 UNIT/ML IJ SOLN
20000.0000 [IU] | INTRAMUSCULAR | Status: DC
  Administered 2013-10-23: 20000 [IU] via SUBCUTANEOUS

## 2013-10-30 ENCOUNTER — Inpatient Hospital Stay (HOSPITAL_COMMUNITY): Payer: BC Managed Care – PPO

## 2013-10-30 ENCOUNTER — Inpatient Hospital Stay (HOSPITAL_COMMUNITY)
Admission: EM | Admit: 2013-10-30 | Discharge: 2013-11-03 | DRG: 682 | Disposition: A | Discharge status: Home / Self Care | Payer: BC Managed Care – PPO | Attending: Internal Medicine | Admitting: Internal Medicine

## 2013-10-30 ENCOUNTER — Encounter (HOSPITAL_COMMUNITY): Payer: Self-pay | Admitting: Emergency Medicine

## 2013-10-30 DIAGNOSIS — R531 Weakness: Secondary | ICD-10-CM | POA: Diagnosis present

## 2013-10-30 DIAGNOSIS — E876 Hypokalemia: Secondary | ICD-10-CM | POA: Diagnosis not present

## 2013-10-30 DIAGNOSIS — N289 Disorder of kidney and ureter, unspecified: Secondary | ICD-10-CM

## 2013-10-30 DIAGNOSIS — E8729 Other acidosis: Secondary | ICD-10-CM | POA: Diagnosis present

## 2013-10-30 DIAGNOSIS — E43 Unspecified severe protein-calorie malnutrition: Secondary | ICD-10-CM | POA: Diagnosis present

## 2013-10-30 DIAGNOSIS — K219 Gastro-esophageal reflux disease without esophagitis: Secondary | ICD-10-CM | POA: Diagnosis present

## 2013-10-30 DIAGNOSIS — E119 Type 2 diabetes mellitus without complications: Secondary | ICD-10-CM | POA: Diagnosis present

## 2013-10-30 DIAGNOSIS — Z888 Allergy status to other drugs, medicaments and biological substances status: Secondary | ICD-10-CM

## 2013-10-30 DIAGNOSIS — E872 Acidosis, unspecified: Secondary | ICD-10-CM

## 2013-10-30 DIAGNOSIS — Z9181 History of falling: Secondary | ICD-10-CM

## 2013-10-30 DIAGNOSIS — D638 Anemia in other chronic diseases classified elsewhere: Secondary | ICD-10-CM | POA: Diagnosis present

## 2013-10-30 DIAGNOSIS — F329 Major depressive disorder, single episode, unspecified: Secondary | ICD-10-CM | POA: Diagnosis present

## 2013-10-30 DIAGNOSIS — F411 Generalized anxiety disorder: Secondary | ICD-10-CM | POA: Diagnosis present

## 2013-10-30 DIAGNOSIS — IMO0002 Reserved for concepts with insufficient information to code with codable children: Secondary | ICD-10-CM

## 2013-10-30 DIAGNOSIS — N183 Chronic kidney disease, stage 3 unspecified: Secondary | ICD-10-CM | POA: Diagnosis present

## 2013-10-30 DIAGNOSIS — F3289 Other specified depressive episodes: Secondary | ICD-10-CM | POA: Diagnosis present

## 2013-10-30 DIAGNOSIS — Z833 Family history of diabetes mellitus: Secondary | ICD-10-CM

## 2013-10-30 DIAGNOSIS — E861 Hypovolemia: Secondary | ICD-10-CM | POA: Diagnosis present

## 2013-10-30 DIAGNOSIS — Z87891 Personal history of nicotine dependence: Secondary | ICD-10-CM

## 2013-10-30 DIAGNOSIS — I129 Hypertensive chronic kidney disease with stage 1 through stage 4 chronic kidney disease, or unspecified chronic kidney disease: Secondary | ICD-10-CM | POA: Diagnosis present

## 2013-10-30 DIAGNOSIS — N12 Tubulo-interstitial nephritis, not specified as acute or chronic: Secondary | ICD-10-CM | POA: Diagnosis present

## 2013-10-30 DIAGNOSIS — N390 Urinary tract infection, site not specified: Secondary | ICD-10-CM | POA: Diagnosis present

## 2013-10-30 DIAGNOSIS — N179 Acute kidney failure, unspecified: Principal | ICD-10-CM | POA: Diagnosis present

## 2013-10-30 DIAGNOSIS — Z8551 Personal history of malignant neoplasm of bladder: Secondary | ICD-10-CM

## 2013-10-30 DIAGNOSIS — N189 Chronic kidney disease, unspecified: Secondary | ICD-10-CM

## 2013-10-30 DIAGNOSIS — Z960 Presence of urogenital implants: Secondary | ICD-10-CM

## 2013-10-30 DIAGNOSIS — C679 Malignant neoplasm of bladder, unspecified: Secondary | ICD-10-CM | POA: Diagnosis present

## 2013-10-30 DIAGNOSIS — E860 Dehydration: Secondary | ICD-10-CM | POA: Diagnosis present

## 2013-10-30 LAB — CREATININE, SERUM
Creatinine, Ser: 2.81 mg/dL — ABNORMAL HIGH (ref 0.50–1.35)
GFR calc Af Amer: 28 mL/min — ABNORMAL LOW (ref >90)
GFR, EST NON AFRICAN AMERICAN: 24 mL/min — AB (ref >90)

## 2013-10-30 LAB — CBC WITH DIFFERENTIAL/PLATELET
BASOS ABS: 0 10*3/uL (ref 0.0–0.1)
BASOS PCT: 0 % (ref 0–1)
EOS ABS: 0.2 10*3/uL (ref 0.0–0.7)
Eosinophils Relative: 2 % (ref 0–5)
HCT: 35.8 % — ABNORMAL LOW (ref 39.0–52.0)
Hemoglobin: 11.4 g/dL — ABNORMAL LOW (ref 13.0–17.0)
Lymphocytes Relative: 14 % (ref 12–46)
Lymphs Abs: 1.5 10*3/uL (ref 0.7–4.0)
MCH: 26.9 pg (ref 26.0–34.0)
MCHC: 31.8 g/dL (ref 30.0–36.0)
MCV: 84.4 fL (ref 78.0–100.0)
MONOS PCT: 6 % (ref 3–12)
Monocytes Absolute: 0.6 10*3/uL (ref 0.1–1.0)
NEUTROS ABS: 8.2 10*3/uL — AB (ref 1.7–7.7)
Neutrophils Relative %: 78 % — ABNORMAL HIGH (ref 43–77)
PLATELETS: 395 10*3/uL (ref 150–400)
RBC: 4.24 MIL/uL (ref 4.22–5.81)
RDW: 18.2 % — AB (ref 11.5–15.5)
WBC: 10.5 10*3/uL (ref 4.0–10.5)

## 2013-10-30 LAB — URINE MICROSCOPIC-ADD ON

## 2013-10-30 LAB — COMPREHENSIVE METABOLIC PANEL
ALT: 8 U/L (ref 0–53)
ANION GAP: 16 — AB (ref 5–15)
AST: 11 U/L (ref 0–37)
Albumin: 3.5 g/dL (ref 3.5–5.2)
Alkaline Phosphatase: 57 U/L (ref 39–117)
BUN: 68 mg/dL — AB (ref 6–23)
CO2: 9 mEq/L — CL (ref 19–32)
Calcium: 11.6 mg/dL — ABNORMAL HIGH (ref 8.4–10.5)
Chloride: 110 mEq/L (ref 96–112)
Creatinine, Ser: 3.13 mg/dL — ABNORMAL HIGH (ref 0.50–1.35)
GFR calc Af Amer: 24 mL/min — ABNORMAL LOW (ref >90)
GFR calc non Af Amer: 21 mL/min — ABNORMAL LOW (ref >90)
Glucose, Bld: 99 mg/dL (ref 70–99)
POTASSIUM: 4.5 meq/L (ref 3.7–5.3)
Sodium: 135 mEq/L — ABNORMAL LOW (ref 137–147)
TOTAL PROTEIN: 7.4 g/dL (ref 6.0–8.3)
Total Bilirubin: <0.2 mg/dL — ABNORMAL LOW (ref 0.3–1.2)

## 2013-10-30 LAB — CBC
HCT: 33.6 % — ABNORMAL LOW (ref 39.0–52.0)
Hemoglobin: 10.4 g/dL — ABNORMAL LOW (ref 13.0–17.0)
MCH: 26.3 pg (ref 26.0–34.0)
MCHC: 31 g/dL (ref 30.0–36.0)
MCV: 85.1 fL (ref 78.0–100.0)
PLATELETS: 346 10*3/uL (ref 150–400)
RBC: 3.95 MIL/uL — ABNORMAL LOW (ref 4.22–5.81)
RDW: 18.2 % — AB (ref 11.5–15.5)
WBC: 9.5 10*3/uL (ref 4.0–10.5)

## 2013-10-30 LAB — URINALYSIS, ROUTINE W REFLEX MICROSCOPIC
Glucose, UA: NEGATIVE mg/dL
Ketones, ur: NEGATIVE mg/dL
Nitrite: NEGATIVE
PH: 7.5 (ref 5.0–8.0)
Protein, ur: 100 mg/dL — AB
Specific Gravity, Urine: 1.012 (ref 1.005–1.030)
UROBILINOGEN UA: 0.2 mg/dL (ref 0.0–1.0)

## 2013-10-30 LAB — TSH: TSH: 3.12 u[IU]/mL (ref 0.350–4.500)

## 2013-10-30 LAB — RETICULOCYTES
RBC.: 3.92 MIL/uL — AB (ref 4.22–5.81)
RETIC COUNT ABSOLUTE: 149 10*3/uL (ref 19.0–186.0)
Retic Ct Pct: 3.8 % — ABNORMAL HIGH (ref 0.4–3.1)

## 2013-10-30 LAB — LACTIC ACID, PLASMA: Lactic Acid, Venous: 1.1 mmol/L (ref 0.5–2.2)

## 2013-10-30 LAB — PRO B NATRIURETIC PEPTIDE: PRO B NATRI PEPTIDE: 96.4 pg/mL (ref 0–125)

## 2013-10-30 LAB — CBG MONITORING, ED: GLUCOSE-CAPILLARY: 115 mg/dL — AB (ref 70–99)

## 2013-10-30 LAB — TROPONIN I

## 2013-10-30 MED ORDER — PANTOPRAZOLE SODIUM 40 MG PO TBEC
80.0000 mg | DELAYED_RELEASE_TABLET | Freq: Every day | ORAL | Status: DC
  Administered 2013-10-31 – 2013-11-03 (×4): 80 mg via ORAL
  Filled 2013-10-30 (×4): qty 2

## 2013-10-30 MED ORDER — SODIUM CHLORIDE 0.9 % IV BOLUS (SEPSIS)
1000.0000 mL | Freq: Once | INTRAVENOUS | Status: AC
  Administered 2013-10-30: 1000 mL via INTRAVENOUS

## 2013-10-30 MED ORDER — HEPARIN SODIUM (PORCINE) 5000 UNIT/ML IJ SOLN
5000.0000 [IU] | Freq: Three times a day (TID) | INTRAMUSCULAR | Status: DC
Start: 2013-10-30 — End: 2013-11-03
  Administered 2013-10-30 – 2013-11-03 (×11): 5000 [IU] via SUBCUTANEOUS
  Filled 2013-10-30 (×14): qty 1

## 2013-10-30 MED ORDER — DEXTROSE 5 % IV SOLN
1.0000 g | INTRAVENOUS | Status: DC
  Administered 2013-10-30 – 2013-11-02 (×4): 1 g via INTRAVENOUS
  Filled 2013-10-30 (×6): qty 10

## 2013-10-30 MED ORDER — CITALOPRAM HYDROBROMIDE 40 MG PO TABS
40.0000 mg | ORAL_TABLET | Freq: Every day | ORAL | Status: DC
  Administered 2013-10-30 – 2013-11-03 (×5): 40 mg via ORAL
  Filled 2013-10-30 (×5): qty 1

## 2013-10-30 MED ORDER — PANTOPRAZOLE SODIUM 40 MG PO TBEC
80.0000 mg | DELAYED_RELEASE_TABLET | Freq: Every day | ORAL | Status: DC
  Administered 2013-10-30: 80 mg via ORAL

## 2013-10-30 NOTE — ED Notes (Signed)
Pt in ct 

## 2013-10-30 NOTE — H&P (Signed)
Hospitalist Admission History and Physical  Patient name: [name] [name] Medical record number: [id number] Date of birth: 01/05/1961 Age: 53 y.o. Gender: male  Primary Care Provider: Florina Ou, MD  Chief Complaint: weakness, AKI, AGMA, UTI   History of Present Illness:This is a 53 y.o. year old male with significant past medical history of bladder cancer s/p bladder resection w/ neo bladder placement and R sided ureteral stents-followed at cancer centers or [place] in [place], HTN, recurrent UTI  presenting with weakness, AKI, AGMA. Pt states that he went to [facility] 2 weeks ago for ureteral stent change. Pt states that since about 4 days after the procedure, he has had progressive malaise, weakness, fatigue. Denies any decreased urinary outpt, though pt states that he has had worsening suprapubic pain and tenderness. Does report coming off of an extended course of levaquin for UTI last week. Sxs currently are not consistent with last UTI. Denies any focal hemiparesis or confusion. No CP, SOB. States he has had about  On presentation, pt hemodynamically stable. Afebrile. Satting 98% on RA. WBC 10.5, Hgb 11.4, Cr 3.13( baseline around 2.5), BUN 68, bicarb 9, AG 20, Ca 11.6 . UA indicative of infection. Bladder scan w/ 150 ccs.   Assessment and Plan: [name] [name] is a 53 y.o. year old male presenting with weakness, AKI, AGMA, UTI   Active Problems:   Weakness   AKI (acute kidney injury)   Metabolic acidosis, increased anion gap   1-Weakness -Suspect multifactorial with contributions of UTI, dehydration, ? Post procedural complications, AKI, metabolic acidosis, metastatic disease  -hydrate pt  -treat UTI-rocephin, urine and blood cultures -gently hydrate  -f/u on CT abd and pelvis to assess for obstruction- would need urology c/s if indicated  -consider IV bicarb x1 if no obstruction present +/- renal c/s  2-AKI  -Acute on chronic issue -mildly dry on exam  -gently hydrate   -f/u on imaging to r/o obstruction  -bladder scan w/ 150 ccs present  -treatment plan as above   3-AGMA  -suspect 2/2 to #2 -check lactate  -consider IV bicarb and renal c/s if no obstruction present   4-UTI  -recurrent issue in setting of significant post operative GU changes  -hardware (stent) in place  -Start Rocephin  -Panculture -F/u on imaging  -consider urology consult as indicated   5-Hypercalcemia  -? Metastatic disease  -check PTH, Ca  -Check vitamin D  -hydrate   6-Anemia  -Likely anemia of chronic disease  -no overt signs of bleeding -check anemia panel  FEN/GI: renal diet for now. PPI  Prophylaxis: sub q heparin  Disposition: pending furhter evaluation  Code Status:Full Code    Patient Active Problem List   Diagnosis Date Noted  . Weakness 10/30/2013  . AKI (acute kidney injury) 10/30/2013  . Metabolic acidosis, increased anion gap 10/30/2013  . Hypercalcemia 06/26/2013  . Nausea and vomiting 06/26/2013  . Metabolic acidosis with normal anion gap and bicarbonate losses 06/25/2013  . UTI (lower urinary tract infection) 06/25/2013  . Acute-on-chronic kidney injury 06/25/2013  . Pyelonephritis 05/09/2013  . SIRS (systemic inflammatory response syndrome) 05/09/2013  . HTN (hypertension) 05/09/2013  . Bladder cancer 05/09/2013  . Tachycardia 05/09/2013   Past Medical History: Past Medical History  Diagnosis Date  . Gout     recent flair -bilateral feet--  STABLE  . Frequency of urination   . Urgency incontinence   . Nocturia   . Diabetes mellitus type 2, diet-controlled PT STOPPED TAKING METFORMIN--  HAS BEEN  WATCHING DIET, EXERCISING AND LOSING WT  . Bladder cancer 2013  . Complication of anesthesia     agitation w/awakening in 9/13,OK 01/24/12  . Hypertension   . Anxiety   . Depression   . GERD (gastroesophageal reflux disease)     Past Surgical History: Past Surgical History  Procedure Laterality Date  . Ulner artery repair  2009     rt -trauma /thrombectomy,repair  . Transurethral resection of bladder tumor  12/15/2011    Procedure: TRANSURETHRAL RESECTION OF BLADDER TUMOR (TURBT);  Surgeon: Molli Hazard, MD;  Location: [facility];  Service: Urology;  Laterality: N/A;  2 HRS Procedure reads: cysto, TURBT , Bilateral retrograde pyelograms, poss bilateral ureteroscopy with biopsy and ureter stent placement   . Lumbar laminectomy  06-02-2005    W/ RESECTION NERVE ROOT  L4  -  L5  . Transurethral resection of bladder tumor  01/24/2012    Procedure: TRANSURETHRAL RESECTION OF BLADDER TUMOR (TURBT);  Surgeon: Molli Hazard, MD;  Location: [facility];  Service: Urology;  Laterality: N/A;  90 MIN ADD LEFT URETER BIOPSY TO PROCEDURE    . Cystoscopy with urethral dilatation  01/24/2012    Procedure: CYSTOSCOPY WITH URETHRAL DILATATION;  Surgeon: Molli Hazard, MD;  Location: [facility];  Service: Urology;  Laterality: N/A;  . Transurethral resection of bladder tumor N/A 07/10/2012    Procedure: TRANSURETHRAL RESECTION OF BLADDER TUMOR (TURBT);  Surgeon: Molli Hazard, MD;  Location: [facility];  Service: Urology;  Laterality: N/A;  . Cystoscopy w/ retrogrades Bilateral 07/10/2012    Procedure: CYSTOSCOPY WITH RETROGRADE PYELOGRAM;  Surgeon: Molli Hazard, MD;  Location: [facility];  Service: Urology;  Laterality: Bilateral;    Social History: History   Social History  . Marital Status: Married    Spouse Name: N/A    Number of Children: N/A  . Years of Education: N/A   Social History Main Topics  . Smoking status: Former Smoker -- 1.00 packs/day for 16 years    Types: Cigarettes    Quit date: 01/20/1992  . Smokeless tobacco: Former Systems developer    Types: [place] date: 01/20/1992  . Alcohol Use: No  . Drug Use: No  . Sexual Activity: Not Currently   Other Topics Concern  . None   Social History  Narrative  . None    Family History: Family History  Problem Relation Age of Onset  . Diabetes Mellitus II Other   . Hypertension Other   . Bladder Cancer Neg Hx     Allergies: Allergies  Allergen Reactions  . Lisinopril Cough    Current Facility-Administered Medications  Medication Dose Route Frequency Provider Last Rate Last Dose  . heparin injection 5,000 Units  5,000 Units Subcutaneous 3 times per day Shanda Howells, MD       Current Outpatient Prescriptions  Medication Sig Dispense Refill  . citalopram (CELEXA) 40 MG tablet Take 40 mg by mouth daily.      Marland Kitchen omeprazole (PRILOSEC) 40 MG capsule Take 1 capsule (40 mg total) by mouth 2 (two) times daily before a meal.  60 capsule  1   Facility-Administered Medications Ordered in Other Encounters  Medication Dose Route Frequency Provider Last Rate Last Dose  . levofloxacin (LEVAQUIN) IVPB 500 mg  500 mg Intravenous Q24H Sharyn Creamer, MD       Review Of Systems: 12 point ROS negative except as noted above in  HPI.  Physical Exam: Filed Vitals:   10/30/13 1815  BP: 140/77  Pulse: 74  Temp:   Resp: 16    General: alert and cooperative HEENT: PERRLA and extra ocular movement intact Heart: S1, S2 normal, no murmur, rub or gallop, regular rate and rhythm Lungs: clear to auscultation, no wheezes or rales and unlabored breathing Abdomen: + bowel sounds, mild suprapubic TTP  Extremities: extremities normal, atraumatic, no cyanosis or edema Skin:no rashes, no ecchymoses Neurology: normal without focal findings  Labs and Imaging: Lab Results  Component Value Date/Time   NA 135* 10/30/2013  4:15 PM   K 4.5 10/30/2013  4:15 PM   CL 110 10/30/2013  4:15 PM   CO2 9* 10/30/2013  4:15 PM   BUN 68* 10/30/2013  4:15 PM   CREATININE 3.13* 10/30/2013  4:15 PM   CREATININE 2.53* 08/29/2013  7:47 PM   GLUCOSE 99 10/30/2013  4:15 PM   Lab Results  Component Value Date   WBC 10.5 10/30/2013   HGB 11.4* 10/30/2013   HCT 35.8*  10/30/2013   MCV 84.4 10/30/2013   PLT 395 10/30/2013    No results found.         Shanda Howells MD  Pager: [phone]

## 2013-10-30 NOTE — ED Notes (Signed)
Attempted to give report 

## 2013-10-30 NOTE — ED Notes (Signed)
2 weeks ago had right kidney stent placed and has tube in kidney that they flush everyday.  Pt has not been feeling well, reports weakness, lower abdominal pain, pt is urinating and is fully of stuff because he has a new bladder reconstruction in 04/2013.  Pt with increasing weakness, stumbling, and fell twice.  Pt state hit head no LOC.

## 2013-10-30 NOTE — ED Provider Notes (Signed)
CSN: [id number]     Arrival date & time 10/30/13  1538 History   First MD Initiated Contact with Patient 10/30/13 1630     Chief Complaint  Patient presents with  . Weakness  . Abdominal Pain  . Back Pain     (Consider location/radiation/quality/duration/timing/severity/associated sxs/prior Treatment) HPI Patient reports he had bladder cancer and had his bladder removed and has a neobladder since January 22. He reports he he had a blockage of the ureter and he now has a stent in place. He often a nephrostomy tube that his wife flushes once a day with 10 cc of normal saline. He relates he urinates normally. He reports he has been feeling weak since he had his surgery. He has lost 70 pounds. He has nausea and vomiting most days including earlier today. Most food makes him have gagging. He has no taste for food. He denies fever. He's had a cough for a couple days that is dry. He has had some suprapubic pain for a few weeks. He states his urine is chronically cloudy. He denies sore throat, rhinorrhea, but states his ears are popping. He denies diarrhea. States he was constipated, his last BM was 2 weeks ago until yesterday when he passed 3 moderate size balls. He states he has no energy. He states today he was helping change a tire and has lost his balance twice falling. He has had some renal insufficiency, he states 2 weeks ago his creatinine was 2.8. At that time they took out his old stent put in a new one. He had an appointment today with his nephrologist Dr. Moshe Cipro at 3 PM however he fell 2 weeks ago. He states he just has no energy.  PCP Dr Rodman Pickle Urology Dr Jasmine December [facility] in [facility] Nephrologist Dr Moshe Cipro  Past Medical History  Diagnosis Date  . Gout     recent flair -bilateral feet--  STABLE  . Frequency of urination   . Urgency incontinence   . Nocturia   . Diabetes mellitus type 2, diet-controlled PT STOPPED TAKING METFORMIN--  HAS  BEEN WATCHING DIET, EXERCISING AND LOSING WT  . Bladder cancer 2013  . Complication of anesthesia     agitation w/awakening in 9/13,OK 01/24/12  . Hypertension   . Anxiety   . Depression   . GERD (gastroesophageal reflux disease)    Past Surgical History  Procedure Laterality Date  . Ulner artery repair  2009    rt -trauma /thrombectomy,repair  . Transurethral resection of bladder tumor  12/15/2011    Procedure: TRANSURETHRAL RESECTION OF BLADDER TUMOR (TURBT);  Surgeon: Molli Hazard, MD;  Location: [facility];  Service: Urology;  Laterality: N/A;  2 HRS Procedure reads: cysto, TURBT , Bilateral retrograde pyelograms, poss bilateral ureteroscopy with biopsy and ureter stent placement   . Lumbar laminectomy  06-02-2005    W/ RESECTION NERVE ROOT  L4  -  L5  . Transurethral resection of bladder tumor  01/24/2012    Procedure: TRANSURETHRAL RESECTION OF BLADDER TUMOR (TURBT);  Surgeon: Molli Hazard, MD;  Location: [facility];  Service: Urology;  Laterality: N/A;  90 MIN ADD LEFT URETER BIOPSY TO PROCEDURE    . Cystoscopy with urethral dilatation  01/24/2012    Procedure: CYSTOSCOPY WITH URETHRAL DILATATION;  Surgeon: Molli Hazard, MD;  Location: [facility];  Service: Urology;  Laterality: N/A;  . Transurethral resection of bladder tumor N/A 07/10/2012  Procedure: TRANSURETHRAL RESECTION OF BLADDER TUMOR (TURBT);  Surgeon: Molli Hazard, MD;  Location: [facility];  Service: Urology;  Laterality: N/A;  . Cystoscopy w/ retrogrades Bilateral 07/10/2012    Procedure: CYSTOSCOPY WITH RETROGRADE PYELOGRAM;  Surgeon: Molli Hazard, MD;  Location: [facility];  Service: Urology;  Laterality: Bilateral;   Family History  Problem Relation Age of Onset  . Diabetes Mellitus II Other   . Hypertension Other   . Bladder Cancer Neg Hx    History  Substance Use Topics  . Smoking  status: Former Smoker -- 1.00 packs/day for 16 years    Types: Cigarettes    Quit date: 01/20/1992  . Smokeless tobacco: Former Systems developer    Types: [place] date: 01/20/1992  . Alcohol Use: No  applying for disability Lives at home Lives with spouse  Review of Systems  All other systems reviewed and are negative.     Allergies  Lisinopril  Home Medications   Prior to Admission medications   Medication Sig Start Date End Date Taking? Authorizing Provider  citalopram (CELEXA) 40 MG tablet Take 40 mg by mouth daily.   Yes Historical Provider, MD  omeprazole (PRILOSEC) 40 MG capsule Take 1 capsule (40 mg total) by mouth 2 (two) times daily before a meal. 06/28/13  Yes Barton Dubois, MD   BP 112/60  Pulse 78  Temp(Src) 97.8 F (36.6 C) (Oral)  Resp 18  SpO2 98%  Vital signs normal   Physical Exam  Nursing note and vitals reviewed. Constitutional: He is oriented to person, place, and time. He appears well-developed and well-nourished.  Non-toxic appearance. He does not appear ill. No distress.  HENT:  Head: Normocephalic and atraumatic.  Right Ear: External ear normal.  Left Ear: External ear normal.  Nose: Nose normal. No mucosal edema or rhinorrhea.  Mouth/Throat: Mucous membranes are normal. No dental abscesses or uvula swelling.  Dry tongue  Eyes: Conjunctivae and EOM are normal. Pupils are equal, round, and reactive to light.  Neck: Normal range of motion and full passive range of motion without pain. Neck supple.  Cardiovascular: Normal rate, regular rhythm and normal heart sounds.  Exam reveals no gallop and no friction rub.   No murmur heard. Pulmonary/Chest: Effort normal and breath sounds normal. No respiratory distress. He has no wheezes. He has no rhonchi. He has no rales. He exhibits no tenderness and no crepitus.  Abdominal: Soft. Normal appearance and bowel sounds are normal. He exhibits no distension. There is tenderness. There is no rebound and no guarding.   Mild suprapubic tenderness  Genitourinary:  Catheter in right flank  Musculoskeletal: Normal range of motion. He exhibits no edema and no tenderness.  Moves all extremities well.   Neurological: He is alert and oriented to person, place, and time. He has normal strength. No cranial nerve deficit.  Skin: Skin is warm, dry and intact. No rash noted. No erythema. No pallor.  Psychiatric: He has a normal mood and affect. His speech is normal and behavior is normal. His mood appears not anxious.    ED Course  Procedures (including critical care time)  Medications  cefTRIAXone (ROCEPHIN) 1 g in dextrose 5 % 50 mL IVPB (0 g Intravenous Stopped 10/30/13 1943)  sodium chloride 0.9 % bolus 1,000 mL (0 mLs Intravenous Stopped 10/30/13 1807)  sodium chloride 0.9 % bolus 1,000 mL (0 mLs Intravenous Stopped 10/30/13 1836)   Patient was given IV fluids for his metabolic  acidosis. He was started on IV antibiotics for possible UTI.   18:29 Dr Romona Curls will see patient for admission. Wants a bladder scan to be done. Also wants head CT and abdomen CT   Labs Review Results for orders placed during the hospital encounter of 10/30/13  CBC WITH DIFFERENTIAL      Result Value Ref Range   WBC 10.5  4.0 - 10.5 K/uL   RBC 4.24  4.22 - 5.81 MIL/uL   Hemoglobin 11.4 (*) 13.0 - 17.0 g/dL   HCT 35.8 (*) 39.0 - 52.0 %   MCV 84.4  78.0 - 100.0 fL   MCH 26.9  26.0 - 34.0 pg   MCHC 31.8  30.0 - 36.0 g/dL   RDW 18.2 (*) 11.5 - 15.5 %   Platelets 395  150 - 400 K/uL   Neutrophils Relative % 78 (*) 43 - 77 %   Neutro Abs 8.2 (*) 1.7 - 7.7 K/uL   Lymphocytes Relative 14  12 - 46 %   Lymphs Abs 1.5  0.7 - 4.0 K/uL   Monocytes Relative 6  3 - 12 %   Monocytes Absolute 0.6  0.1 - 1.0 K/uL   Eosinophils Relative 2  0 - 5 %   Eosinophils Absolute 0.2  0.0 - 0.7 K/uL   Basophils Relative 0  0 - 1 %   Basophils Absolute 0.0  0.0 - 0.1 K/uL  COMPREHENSIVE METABOLIC PANEL      Result Value Ref Range   Sodium 135 (*)  137 - 147 mEq/L   Potassium 4.5  3.7 - 5.3 mEq/L   Chloride 110  96 - 112 mEq/L   CO2 9 (*) 19 - 32 mEq/L   Glucose, Bld 99  70 - 99 mg/dL   BUN 68 (*) 6 - 23 mg/dL   Creatinine, Ser 3.13 (*) 0.50 - 1.35 mg/dL   Calcium 11.6 (*) 8.4 - 10.5 mg/dL   Total Protein 7.4  6.0 - 8.3 g/dL   Albumin 3.5  3.5 - 5.2 g/dL   AST 11  0 - 37 U/L   ALT 8  0 - 53 U/L   Alkaline Phosphatase 57  39 - 117 U/L   Total Bilirubin <0.2 (*) 0.3 - 1.2 mg/dL   GFR calc non Af Amer 21 (*) >90 mL/min   GFR calc Af Amer 24 (*) >90 mL/min   Anion gap 16 (*) 5 - 15  TROPONIN I      Result Value Ref Range   Troponin I <0.30  <0.30 ng/mL  URINALYSIS, ROUTINE W REFLEX MICROSCOPIC      Result Value Ref Range   Color, Urine YELLOW  YELLOW   APPearance CLOUDY (*) CLEAR   Specific Gravity, Urine 1.012  1.005 - 1.030   pH 7.5  5.0 - 8.0   Glucose, UA NEGATIVE  NEGATIVE mg/dL   Hgb urine dipstick LARGE (*) NEGATIVE   Bilirubin Urine SMALL (*) NEGATIVE   Ketones, ur NEGATIVE  NEGATIVE mg/dL   Protein, ur 100 (*) NEGATIVE mg/dL   Urobilinogen, UA 0.2  0.0 - 1.0 mg/dL   Nitrite NEGATIVE  NEGATIVE   Leukocytes, UA LARGE (*) NEGATIVE  URINE MICROSCOPIC-ADD ON      Result Value Ref Range   Squamous Epithelial / LPF RARE  RARE   WBC, UA 21-50  <3 WBC/hpf   RBC / HPF 11-20  <3 RBC/hpf   Bacteria, UA MANY (*) RARE   Casts HYALINE CASTS (*) NEGATIVE  CBC      Result Value Ref Range   WBC 9.5  4.0 - 10.5 K/uL   RBC 3.95 (*) 4.22 - 5.81 MIL/uL   Hemoglobin 10.4 (*) 13.0 - 17.0 g/dL   HCT 33.6 (*) 39.0 - 52.0 %   MCV 85.1  78.0 - 100.0 fL   MCH 26.3  26.0 - 34.0 pg   MCHC 31.0  30.0 - 36.0 g/dL   RDW 18.2 (*) 11.5 - 15.5 %   Platelets 346  150 - 400 K/uL  CREATININE, SERUM      Result Value Ref Range   Creatinine, Ser 2.81 (*) 0.50 - 1.35 mg/dL   GFR calc non Af Amer 24 (*) >90 mL/min   GFR calc Af Amer 28 (*) >90 mL/min  TSH      Result Value Ref Range   TSH 3.120  0.350 - 4.500 uIU/mL  PRO B NATRIURETIC  PEPTIDE      Result Value Ref Range   Pro B Natriuretic peptide (BNP) 96.4  0 - 125 pg/mL  LACTIC ACID, PLASMA      Result Value Ref Range   Lactic Acid, Venous 1.1  0.5 - 2.2 mmol/L  RETICULOCYTES      Result Value Ref Range   Retic Ct Pct 3.8 (*) 0.4 - 3.1 %   RBC. 3.92 (*) 4.22 - 5.81 MIL/uL   Retic Count, Manual 149.0  19.0 - 186.0 K/uL  CBG MONITORING, ED      Result Value Ref Range   Glucose-Capillary 115 (*) 70 - 99 mg/dL   Laboratory interpretation all normal except stable anemia, possible UTI with leukocytes and many bacteria, worsening of his stable renal insufficiency, hypercalcemia, metabolic acidosis      Imaging Review Ct Abdomen Pelvis Wo Contrast  10/30/2013   CLINICAL DATA:  Patient status post recent placement of right percutaneous nephroureteral stent. Lower abdominal pain.  EXAM: CT ABDOMEN AND PELVIS WITHOUT CONTRAST  TECHNIQUE: Multidetector CT imaging of the abdomen and pelvis was performed following the standard protocol without IV contrast.  COMPARISON:  CT abdomen pelvis 05/21/2013.  FINDINGS: Visualization of the lower thorax demonstrates minimal dependent atelectasis. Normal heart size.  Lack of intravenous contrast material limits evaluation of the solid organ parenchyma.  Noncontrast enhanced liver is unremarkable. The gallbladder is contracted. The spleen, pancreas bilateral adrenal glands are unremarkable.  Re- demonstrated right-sided nephrolithiasis. Interval removal of left nephroureteral stent with mild left hydroureteronephrosis. Interval placement of right percutaneous nephroureteral stent coiled in the right renal collecting system and distally within the neobladder. Extensive fat stranding about the right renal collecting system.  Neobladder visualized within the pelvis, slightly more distended than on prior examination. Small amount of gas anteriorly within the neobladder.  Normal caliber abdominal aorta. No retroperitoneal lymphadenopathy. Extensive  pelvic surgical clips. Patient status post cystectomy.  Stool throughout the colon. Normal appendix. No evidence for bowel obstruction. No free fluid or free intraperitoneal air.  Lower lumbar spine degenerative change with bilateral L4 pars defects and grade 1 anterolisthesis of L4 on L5. Re- demonstrated T11 vertebral compression fracture.  IMPRESSION: 1. Patient status post removal of left percutaneous nephroureteral stent. There is persistent mild left hydroureteronephrosis to the level of the neobladder. 2. Interval insertion of right percutaneous nephroureteral stent with the distal aspect coiled in the neobladder. There is extensive stranding about the right renal collecting system and right renal hilum which is nonspecific and may be postprocedural in etiology. An infectious process could appear similar.  There is persistent moderate right hydronephrosis. 3. Gas within neobladder, potentially post procedural in etiology. Infection not excluded. Interval removal of suprapubic catheter. Neobladder appears more distended than on prior examination.   Electronically Signed   By: Lovey Newcomer M.D.   On: 10/30/2013 20:36   X-ray Chest Pa And Lateral   10/30/2013   CLINICAL DATA:  Weakness.  EXAM: CHEST  2 VIEW  COMPARISON:  Chest x-rays dated 06/25/2013 and 05/24/2013  FINDINGS: Heart size and pulmonary vascularity are normal and the lungs are clear. No acute osseous abnormality. Old compression fracture in the lower thoracic spine, stable.  IMPRESSION: No acute abnormality.   Electronically Signed   By: Rozetta Nunnery M.D.   On: 10/30/2013 20:28   Ct Head Wo Contrast  10/30/2013   CLINICAL DATA:  Weakness. The patient has fallen twice and struck his head. No loss of consciousness.  EXAM: CT HEAD WITHOUT CONTRAST  TECHNIQUE: Contiguous axial images were obtained from the base of the skull through the vertex without intravenous contrast.  COMPARISON:  CT scan dated 10/28/2005  FINDINGS: No mass lesion. No midline  shift. No acute hemorrhage or hematoma. No extra-axial fluid collections. No evidence of acute infarction. Brain parenchyma is normal. Osseous structures are normal.  IMPRESSION: Normal exam.   Electronically Signed   By: Rozetta Nunnery M.D.   On: 10/30/2013 20:17     EKG Interpretation   Date/Time:  Tuesday October 30 2013 15:52:27 EDT Ventricular Rate:  78 PR Interval:  168 QRS Duration: 102 QT Interval:  358 QTC Calculation: 408 R Axis:   100 Text Interpretation:  Normal sinus rhythm Rightward axis Nonspecific ST  abnormality Since last tracing rate slower (30 May 2013) Confirmed by  Taelyn Broecker  MD-I, Omaira Mellen ([id number]) on 10/30/2013 4:31:56 PM      MDM   Final diagnoses:  Metabolic acidosis  Acute on chronic renal insufficiency  Dehydration  Weakness  Hypercalcemia    Plan admission   Rolland Porter, MD, Alanson Aly, MD 10/30/13 2049

## 2013-10-31 DIAGNOSIS — N390 Urinary tract infection, site not specified: Secondary | ICD-10-CM

## 2013-10-31 DIAGNOSIS — N179 Acute kidney failure, unspecified: Principal | ICD-10-CM

## 2013-10-31 DIAGNOSIS — N189 Chronic kidney disease, unspecified: Secondary | ICD-10-CM

## 2013-10-31 DIAGNOSIS — E872 Acidosis, unspecified: Secondary | ICD-10-CM

## 2013-10-31 DIAGNOSIS — E43 Unspecified severe protein-calorie malnutrition: Secondary | ICD-10-CM | POA: Diagnosis present

## 2013-10-31 LAB — COMPREHENSIVE METABOLIC PANEL
ALBUMIN: 2.9 g/dL — AB (ref 3.5–5.2)
ALT: 8 U/L (ref 0–53)
ANION GAP: 14 (ref 5–15)
AST: 8 U/L (ref 0–37)
Alkaline Phosphatase: 50 U/L (ref 39–117)
BUN: 65 mg/dL — AB (ref 6–23)
CALCIUM: 10.8 mg/dL — AB (ref 8.4–10.5)
CO2: 9 mEq/L — CL (ref 19–32)
CREATININE: 3.13 mg/dL — AB (ref 0.50–1.35)
Chloride: 114 mEq/L — ABNORMAL HIGH (ref 96–112)
GFR calc Af Amer: 24 mL/min — ABNORMAL LOW (ref >90)
GFR calc non Af Amer: 21 mL/min — ABNORMAL LOW (ref >90)
Glucose, Bld: 81 mg/dL (ref 70–99)
Potassium: 4.3 mEq/L (ref 3.7–5.3)
Sodium: 137 mEq/L (ref 137–147)
TOTAL PROTEIN: 6.2 g/dL (ref 6.0–8.3)
Total Bilirubin: <0.2 mg/dL — ABNORMAL LOW (ref 0.3–1.2)

## 2013-10-31 LAB — CBC WITH DIFFERENTIAL/PLATELET
BASOS ABS: 0 10*3/uL (ref 0.0–0.1)
Basophils Relative: 0 % (ref 0–1)
Eosinophils Absolute: 0.2 10*3/uL (ref 0.0–0.7)
Eosinophils Relative: 2 % (ref 0–5)
HEMATOCRIT: 32.6 % — AB (ref 39.0–52.0)
Hemoglobin: 10.3 g/dL — ABNORMAL LOW (ref 13.0–17.0)
LYMPHS PCT: 22 % (ref 12–46)
Lymphs Abs: 1.8 10*3/uL (ref 0.7–4.0)
MCH: 27.2 pg (ref 26.0–34.0)
MCHC: 31.6 g/dL (ref 30.0–36.0)
MCV: 86.2 fL (ref 78.0–100.0)
Monocytes Absolute: 0.6 10*3/uL (ref 0.1–1.0)
Monocytes Relative: 8 % (ref 3–12)
NEUTROS ABS: 5.6 10*3/uL (ref 1.7–7.7)
Neutrophils Relative %: 68 % (ref 43–77)
Platelets: 346 10*3/uL (ref 150–400)
RBC: 3.78 MIL/uL — ABNORMAL LOW (ref 4.22–5.81)
RDW: 18.9 % — ABNORMAL HIGH (ref 11.5–15.5)
WBC: 8.2 10*3/uL (ref 4.0–10.5)

## 2013-10-31 LAB — IRON AND TIBC
Iron: 21 ug/dL — ABNORMAL LOW (ref 42–135)
Saturation Ratios: 12 % — ABNORMAL LOW (ref 20–55)
TIBC: 172 ug/dL — ABNORMAL LOW (ref 215–435)
UIBC: 151 ug/dL (ref 125–400)

## 2013-10-31 LAB — BLOOD GAS, ARTERIAL
ACID-BASE DEFICIT: 19.6 mmol/L — AB (ref 0.0–2.0)
Bicarbonate: 7.8 mEq/L — ABNORMAL LOW (ref 20.0–24.0)
Drawn by: 257701
FIO2: 0.21 %
O2 Saturation: 94.4 %
PATIENT TEMPERATURE: 98.6
PCO2 ART: 24.6 mmHg — AB (ref 35.0–45.0)
TCO2: 8.6 mmol/L (ref 0–100)
pH, Arterial: 7.129 — CL (ref 7.350–7.450)
pO2, Arterial: 81.9 mmHg (ref 80.0–100.0)

## 2013-10-31 LAB — FERRITIN: Ferritin: 299 ng/mL (ref 22–322)

## 2013-10-31 LAB — PTH, INTACT AND CALCIUM
Calcium, Total (PTH): 10.5 mg/dL (ref 8.4–10.5)
PTH: 8.6 pg/mL — ABNORMAL LOW (ref 14.0–72.0)

## 2013-10-31 LAB — VITAMIN D 25 HYDROXY (VIT D DEFICIENCY, FRACTURES): VIT D 25 HYDROXY: 24 ng/mL — AB (ref 30–89)

## 2013-10-31 LAB — FOLATE: FOLATE: 10.8 ng/mL

## 2013-10-31 LAB — VITAMIN B12: VITAMIN B 12: 384 pg/mL (ref 211–911)

## 2013-10-31 MED ORDER — SODIUM BICARBONATE 8.4 % IV SOLN
INTRAVENOUS | Status: DC
  Administered 2013-10-31 – 2013-11-02 (×4): via INTRAVENOUS
  Filled 2013-10-31 (×8): qty 150

## 2013-10-31 MED ORDER — NEPRO/CARBSTEADY PO LIQD
237.0000 mL | Freq: Three times a day (TID) | ORAL | Status: DC
  Administered 2013-10-31 – 2013-11-03 (×8): 237 mL via ORAL

## 2013-10-31 NOTE — Progress Notes (Signed)
[facility] PROGRESS NOTE  WHITAKER ISER [id number] DOB: [birth year] DOA: 10/30/2013  PCP: Florina Ou, MD  Brief HPI: [id number] with complicated urological history with bladder cancer s/p surgery, neobladder, ureteral stents for hydronephrosis at [facility] [place], [place], who presents with weakness and found to have AKI.  Past medical history:  Past Medical History  Diagnosis Date  . Gout     recent flair -bilateral feet--  STABLE  . Frequency of urination   . Urgency incontinence   . Nocturia   . Diabetes mellitus type 2, diet-controlled PT STOPPED TAKING METFORMIN--  HAS BEEN WATCHING DIET, EXERCISING AND LOSING WT  . Bladder cancer 2013  . Complication of anesthesia     agitation w/awakening in 9/13,OK 01/24/12  . Hypertension   . Anxiety   . Depression   . GERD (gastroesophageal reflux disease)     Consultants: Nephrology, Urology  Procedures: None  Antibiotics: Ceftriaxone 7/28-->  Subjective: Patient complaints of weakness and nausea with vomiting. Some lower abdominal pain as well. Denies flank pain. Had right nephroureteral stent replaced 2 weeks ago at [facility].  Objective: Vital Signs  Filed Vitals:   10/30/13 1815 10/30/13 1900 10/30/13 2046 10/31/13 0959  BP: 140/77 103/75 120/69 108/65  Pulse: 74 75 77 74  Temp:   97.6 F (36.4 C) 98 F (36.7 C)  TempSrc:   Oral Oral  Resp: 16  16 17   Height:   5\' 9"  (1.753 m)   Weight:   76.975 kg (169 lb 11.2 oz)   SpO2: 100% 100% 98% 97%    Intake/Output Summary (Last 24 hours) at 10/31/13 1041 Last data filed at 10/31/13 0900  Gross per 24 hour  Intake    600 ml  Output      0 ml  Net    600 ml   Filed Weights   10/30/13 2046  Weight: 76.975 kg (169 lb 11.2 oz)    General appearance: alert, cooperative, appears stated age, fatigued and no distress Head: Normocephalic, without obvious abnormality, atraumatic Throat: dry mm Back: SQ Nephrostomy tube noted. No  erythema. Resp: clear to auscultation bilaterally Cardio: regular rate and rhythm, S1, S2 normal, no murmur, click, rub or gallop GI: soft, non-tender; bowel sounds normal; no masses,  no organomegaly Extremities: extremities normal, atraumatic, no cyanosis or edema Pulses: 2+ and symmetric Neurologic: Alert and oriented x 3. No focal deficits.  Lab Results:  Basic Metabolic Panel:  Recent Labs Lab 10/30/13 1615 10/30/13 1919 10/31/13 0641  NA 135*  --  137  K 4.5  --  4.3  CL 110  --  114*  CO2 9*  --  9*  GLUCOSE 99  --  81  BUN 68*  --  65*  CREATININE 3.13* 2.81* 3.13*  CALCIUM 11.6*  --  10.8*   Liver Function Tests:  Recent Labs Lab 10/30/13 1615 10/31/13 0641  AST 11 8  ALT 8 8  ALKPHOS 57 50  BILITOT <0.2* <0.2*  PROT 7.4 6.2  ALBUMIN 3.5 2.9*   CBC:  Recent Labs Lab 10/30/13 1615 10/30/13 1919 10/31/13 0641  WBC 10.5 9.5 8.2  NEUTROABS 8.2*  --  5.6  HGB 11.4* 10.4* 10.3*  HCT 35.8* 33.6* 32.6*  MCV 84.4 85.1 86.2  PLT 395 346 346   Cardiac Enzymes:  Recent Labs Lab 10/30/13 1615  TROPONINI <0.30   BNP (last 3 results)  Recent Labs  10/30/13 1919  PROBNP 96.4   CBG:  Recent Labs Lab 10/30/13 1620  GLUCAP 115*     Studies/Results: Ct Abdomen Pelvis Wo Contrast  10/30/2013   CLINICAL DATA:  Patient status post recent placement of right percutaneous nephroureteral stent. Lower abdominal pain.  EXAM: CT ABDOMEN AND PELVIS WITHOUT CONTRAST  TECHNIQUE: Multidetector CT imaging of the abdomen and pelvis was performed following the standard protocol without IV contrast.  COMPARISON:  CT abdomen pelvis 05/21/2013.  FINDINGS: Visualization of the lower thorax demonstrates minimal dependent atelectasis. Normal heart size.  Lack of intravenous contrast material limits evaluation of the solid organ parenchyma.  Noncontrast enhanced liver is unremarkable. The gallbladder is contracted. The spleen, pancreas bilateral adrenal glands are  unremarkable.  Re- demonstrated right-sided nephrolithiasis. Interval removal of left nephroureteral stent with mild left hydroureteronephrosis. Interval placement of right percutaneous nephroureteral stent coiled in the right renal collecting system and distally within the neobladder. Extensive fat stranding about the right renal collecting system.  Neobladder visualized within the pelvis, slightly more distended than on prior examination. Small amount of gas anteriorly within the neobladder.  Normal caliber abdominal aorta. No retroperitoneal lymphadenopathy. Extensive pelvic surgical clips. Patient status post cystectomy.  Stool throughout the colon. Normal appendix. No evidence for bowel obstruction. No free fluid or free intraperitoneal air.  Lower lumbar spine degenerative change with bilateral L4 pars defects and grade 1 anterolisthesis of L4 on L5. Re- demonstrated T11 vertebral compression fracture.  IMPRESSION: 1. Patient status post removal of left percutaneous nephroureteral stent. There is persistent mild left hydroureteronephrosis to the level of the neobladder. 2. Interval insertion of right percutaneous nephroureteral stent with the distal aspect coiled in the neobladder. There is extensive stranding about the right renal collecting system and right renal hilum which is nonspecific and may be postprocedural in etiology. An infectious process could appear similar. There is persistent moderate right hydronephrosis. 3. Gas within neobladder, potentially post procedural in etiology. Infection not excluded. Interval removal of suprapubic catheter. Neobladder appears more distended than on prior examination.   Electronically Signed   By: Lovey Newcomer M.D.   On: 10/30/2013 20:36   X-ray Chest Pa And Lateral   10/30/2013   CLINICAL DATA:  Weakness.  EXAM: CHEST  2 VIEW  COMPARISON:  Chest x-rays dated 06/25/2013 and 05/24/2013  FINDINGS: Heart size and pulmonary vascularity are normal and the lungs are  clear. No acute osseous abnormality. Old compression fracture in the lower thoracic spine, stable.  IMPRESSION: No acute abnormality.   Electronically Signed   By: Rozetta Nunnery M.D.   On: 10/30/2013 20:28   Ct Head Wo Contrast  10/30/2013   CLINICAL DATA:  Weakness. The patient has fallen twice and struck his head. No loss of consciousness.  EXAM: CT HEAD WITHOUT CONTRAST  TECHNIQUE: Contiguous axial images were obtained from the base of the skull through the vertex without intravenous contrast.  COMPARISON:  CT scan dated 10/28/2005  FINDINGS: No mass lesion. No midline shift. No acute hemorrhage or hematoma. No extra-axial fluid collections. No evidence of acute infarction. Brain parenchyma is normal. Osseous structures are normal.  IMPRESSION: Normal exam.   Electronically Signed   By: Rozetta Nunnery M.D.   On: 10/30/2013 20:17    Medications:  Scheduled: . cefTRIAXone (ROCEPHIN)  IV  1 g Intravenous Q24H  . citalopram  40 mg Oral Daily  . heparin  5,000 Units Subcutaneous 3 times per day  . pantoprazole  80 mg Oral Daily   Continuous: .  sodium bicarbonate  infusion 1000  mL 75 mL/hr at 10/31/13 0947   PRN:  Assessment/Plan:  Principal Problem:   AKI (acute kidney injury) Active Problems:   Bladder cancer   UTI (lower urinary tract infection)   Weakness   Metabolic acidosis, increased anion gap    Acute Kidney Injury on CKD Stage 3 Patient's urological issues predominantly the etiology. However he seems to have developed CKD as well and now with acute injury. Will continue IVF. Will consult nephrology to assist as patient was supposed to see his nephrologist yesterday. Monitor urine output.  Metabolic Acidosis Likely due to renal failure. He had similar presentation earlier this year. Will initiate bicarbonate infusion. Will check ABG. Will await Nephrology input.  UTI with Renal stranding Likely due to stent. Await urine culture. Continue ceftriaxone. Urology  consulted.  History of Bladder cancer and Right Nephroureteral Stent Probably contributing to above. CT report reviewed. Discussed with Dr. Dorina Hoyer and he will consult.  Hypercalcemia Likely due to hypovolemia. Repeat in AM after hydration.  Normocytic Anemia Stable. Monitor.  Generalized Weakness/Fatigue Likely due to acute medical issues. No focal deficits.   Code Status: Full Code  DVT Prophylaxis: Heparin    Family Communication: Discussed with patient and his wife  Disposition Plan: Not ready for discharge    LOS: 1 day   [facility] Pager [phone] 10/31/2013, 10:41 AM  If 8PM-8AM, please contact night-coverage at [email or web address], password [email or web address]   Disclaimer: This note was dictated with voice recognition software. Similar sounding words can inadvertently be transcribed and may not be corrected upon review.

## 2013-10-31 NOTE — Consult Note (Signed)
Urology Consult  CC: Acute renal insufficiency  HPI: 53 year old male with a history of bladder cancer, status post radical cystoprostatectomy and neobladder formation in January of this year. He was previously a patient of our practice. However, he was self-referred to the cancer centers of [place] in [place] where he underwent the above-mentioned procedure in January of this year. He has had several hospitalizations since that time, starting a couple of weeks after his initial discharge.  He was admitted yesterday with a history of increasing fatigue, weakness and mild lower abdominal pain. He has had no fever or chills. He had a nephroureteral catheter change in [place] at the cancer centers a couple of weeks ago. This was capped. The patient's wife irrigate as this once a day. Prior nephroureteral catheter did not produce pain with irrigation, his current one does. He has had no left-sided pain. He has had no nausea or vomiting. He has had significant constipation, and had his first bowel movement in 2 weeks this past Monday. He has had decreased fluid intake. His nephroureteral catheter was difficult to change 2 weeks ago because of encrustations.  The patient was found to have an elevated creatinine of 3.1 at the time of his admission. BUN was 68, bicarbonate was 9. Calcium was 11.6. Baseline creatinine is about 2.1.  CT scan revealed mild to moderate hydronephrosis of the right renal unit. There was minimal hydronephrosis on the left. Nephroureteral catheter was adequately positioned in the right kidney and ureter.  The patient voids adequately. He has had no problems with retention. He has not had to catheterize nor irrigate his neobladder since his catheter was removed.  Urologic consultation is requested.   PMH: Past Medical History  Diagnosis Date  . Gout     recent flair -bilateral feet--  STABLE  . Frequency of urination   . Urgency incontinence   . Nocturia   .  Diabetes mellitus type 2, diet-controlled PT STOPPED TAKING METFORMIN--  HAS BEEN WATCHING DIET, EXERCISING AND LOSING WT  . Bladder cancer 2013  . Complication of anesthesia     agitation w/awakening in 9/13,OK 01/24/12  . Hypertension   . Anxiety   . Depression   . GERD (gastroesophageal reflux disease)     PSH: Past Surgical History  Procedure Laterality Date  . [place] artery repair Right 2009    rt -trauma /thrombectomy,repair  . Transurethral resection of bladder tumor  12/15/2011    Procedure: TRANSURETHRAL RESECTION OF BLADDER TUMOR (TURBT);  Surgeon: Molli Hazard, MD;  Location: [facility];  Service: Urology;  Laterality: N/A;  2 HRS   . Lumbar laminectomy  06-02-2005    W/ RESECTION NERVE ROOT  L4  -  L5  . Transurethral resection of bladder tumor  01/24/2012    Procedure: TRANSURETHRAL RESECTION OF BLADDER TUMOR (TURBT);  Surgeon: Molli Hazard, MD;  Location: [facility];  Service: Urology;  Laterality: N/A;  90 MIN   . Cystoscopy with urethral dilatation  01/24/2012    Procedure: CYSTOSCOPY WITH URETHRAL DILATATION;  Surgeon: Molli Hazard, MD;  Location[facility];  Service: Urology;  Laterality: N/A;  . Transurethral resection of bladder tumor N/A 07/10/2012    Procedure: TRANSURETHRAL RESECTION OF BLADDER TUMOR (TURBT);  Surgeon: Molli Hazard, MD;  Location[facility];  Service: Urology;  Laterality: N/A;  . Cystoscopy w/ retrogrades Bilateral 07/10/2012    Procedure: CYSTOSCOPY WITH RETROGRADE PYELOGRAM;  Surgeon:  Molli Hazard, MD;  [facility];  Service: Urology;  Laterality: Bilateral;  . Back surgery      Allergies: Allergies  Allergen Reactions  . Lisinopril Cough    Medications: Prescriptions prior to admission  Medication Sig Dispense Refill  . citalopram (CELEXA) 40 MG tablet Take 40 mg by mouth daily.      Marland Kitchen omeprazole  (PRILOSEC) 40 MG capsule Take 1 capsule (40 mg total) by mouth 2 (two) times daily before a meal.  60 capsule  1     Social History: History   Social History  . Marital Status: Married    Spouse Name: N/A    Number of Children: N/A  . Years of Education: N/A   Occupational History  . Not on file.   Social History Main Topics  . Smoking status: Former Smoker -- 1.00 packs/day for 16 years    Types: Cigarettes    Quit date: 01/20/1992  . Smokeless tobacco: Former Systems developer    Types: [place] date: 01/20/1992  . Alcohol Use: No  . Drug Use: No  . Sexual Activity: Not Currently   Other Topics Concern  . Not on file   Social History Narrative  . No narrative on file    Family History: Family History  Problem Relation Age of Onset  . Diabetes Mellitus II Other   . Hypertension Other   . Bladder Cancer Neg Hx     Review of Systems: Positive: Mild abdominal discomfort, weakness, fatigue, decreased stamina, decreased by mouth intake. The patient has fallen twice the day before admission Negative:   A further 10 point review of systems was negative except what is listed in the HPI.  Physical Exam: @VITALS2 @ General: No acute distress.  Awake. The patient was listless. Head:  Normocephalic.  Atraumatic. ENT:  EOMI.  Mucous membranes moist Neck:  Supple.  No lymphadenopathy. CV:  S1 present. S2 present. Regular rate. Pulmonary: Equal effort bilaterally.  Clear to auscultation bilaterally. Abdomen: Soft.  There was no rebound or guarding. Minimal suprapubic tenderness. No CVA tenderness. Skin:  Normal turgor.  No visible rash. Extremity: No gross deformity of bilateral upper extremities.  No gross deformity of bilateral lower extremities. Neurologic: Alert. Appropriate mood.  Penis:  Circumcised.  No lesions. Urethra: No Foley catheter in place.  Orthotopic meatus. Scrotum: No lesions.  No ecchymosis.  No erythema. Testicles: Descended bilaterally.  No masses  bilaterally. Epididymis: Palpable bilaterally.  Non Tender to palpation.  Studies:  Recent Labs     10/30/13  1919  10/31/13  0641  HGB  10.4*  10.3*  WBC  9.5  8.2  PLT  346  346    Recent Labs     10/30/13  1615  10/30/13  1919  10/30/13  1931  10/31/13  0641  NA  135*   --    --   137  K  4.5   --    --   4.3  CL  110   --    --   114*  CO2  9*   --    --   9*  BUN  68*   --    --   65*  CREATININE  3.13*  2.81*   --   3.13*  CALCIUM  11.6*   --   10.5  10.8*  GFRNONAA  21*  24*   --   21*  GFRAA  24*  28*   --  24*     No results found for this basename: PT, INR, APTT,  in the last 72 hours   No components found with this basename: ABG,   I reviewed the patient's CT scan images.  Assessment:  1. Bladder cancer. Unknown stage, status post radical cystoprostatectomy, bilateral pelvic lymph node dissection, formation of neobladder in January of this year. According to him, there is no evidence of recurrence. Radiographically, this is confirmed.  2. Probable right distal ureteral stricture, treated with stenting/nephroureteral catheter. It looks like the right renal unit still is not draining appropriately.  3. Mild dilatation of the left collecting system, most likely physiologic following removal of the bladder with urinary diversion  4. Acute renal insufficiency, with a bump in creatinine and significant acidemia.  5. Possible pyelonephritis  Plan: 1. I would recommend opening up his nephroureteral catheter and connecting it to drain. Also, nursing should irrigate with 10 cc of normal saline twice a day. If a nephrostomy drainage bag as needed, you can call interventional radiology for one  2. I would recommend correction of his acidemia. Between yesterday and today, he has not had a change in his bicarbonate level  3. I agree with antibiotic management  4. I have spoken with the patient and his wife-I would recommend keeping the nephroureteral catheter to  drain until he follows up with his urologist in [place].  5. I'll continue to follow him.    Pager:[phone]

## 2013-10-31 NOTE — Progress Notes (Signed)
Pt resting resp even and unlabored.  Wife sleeping at bedside.

## 2013-10-31 NOTE — Progress Notes (Signed)
Pt ABG ,PH 7.12 CO2 24.6 PO2 81.9, Paged Dr. Roxine Caddy

## 2013-10-31 NOTE — Progress Notes (Signed)
Dear Doctor: [place] Pink This patient has been identified as a candidate for PICC for the following reason (s): drug pH or osmolality (causing phlebitis, infiltration in 24 hours) If you agree, please write an order for the indicated device. For any questions contact the Vascular Access Team at [phone] if no answer, please leave a message.  Thank you for supporting the early vascular access assessment program.

## 2013-10-31 NOTE — Progress Notes (Signed)
Lab called critical CO2 9, paged Dr.Krishnan FYI

## 2013-10-31 NOTE — Consult Note (Signed)
Referring Provider: No ref. provider found Primary Care Physician:  Florina Ou, MD Primary Nephrologist:  Dr. Moshe Cipro  Reason for Consultation:  Acute renal failure Metabolic acidosis HPI:He had been followed by Dr. Jasmine December of our practice for some time, and was eventually referred for possible cystectomy, after failing BCG. He was first seen at [facility] Department of urology, and eventually is referred himself to cancer treatment centers of [place] in [place], [place]. He underwent radical cystoprostatectomy, bilateral pelvic lymphadenectomy and creation of an ileal neobladder. He had a SP catheter placed by Dr Jeffie Pollock in February that has since been removed and he had a percutaneous nephrostomy  mid July Right kidney    Past Medical History  Diagnosis Date  . Gout     recent flair -bilateral feet--  STABLE  . Frequency of urination   . Urgency incontinence   . Nocturia   . Diabetes mellitus type 2, diet-controlled PT STOPPED TAKING METFORMIN--  HAS BEEN WATCHING DIET, EXERCISING AND LOSING WT  . Bladder cancer 2013  . Complication of anesthesia     agitation w/awakening in 9/13,OK 01/24/12  . Hypertension   . Anxiety   . Depression   . GERD (gastroesophageal reflux disease)     Past Surgical History  Procedure Laterality Date  . Ulner artery repair Right 2009    rt -trauma /thrombectomy,repair  . Transurethral resection of bladder tumor  12/15/2011    Procedure: TRANSURETHRAL RESECTION OF BLADDER TUMOR (TURBT);  Surgeon: Molli Hazard, MD;  Location: [facility];  Service: Urology;  Laterality: N/A;  2 HRS   . Lumbar laminectomy  06-02-2005    W/ RESECTION NERVE ROOT  L4  -  L5  . Transurethral resection of bladder tumor  01/24/2012    Procedure: TRANSURETHRAL RESECTION OF BLADDER TUMOR (TURBT);  Surgeon: Molli Hazard, MD;  Location: [facility];  Service: Urology;  Laterality: N/A;  90 MIN   . Cystoscopy with  urethral dilatation  01/24/2012    Procedure: CYSTOSCOPY WITH URETHRAL DILATATION;  Surgeon: Molli Hazard, MD;  Location: [facility];  Service: Urology;  Laterality: N/A;  . Transurethral resection of bladder tumor N/A 07/10/2012    Procedure: TRANSURETHRAL RESECTION OF BLADDER TUMOR (TURBT);  Surgeon: Molli Hazard, MD;  Location: [facility];  Service: Urology;  Laterality: N/A;  . Cystoscopy w/ retrogrades Bilateral 07/10/2012    Procedure: CYSTOSCOPY WITH RETROGRADE PYELOGRAM;  Surgeon: Molli Hazard, MD;  Location: [facility];  Service: Urology;  Laterality: Bilateral;  . Back surgery      Prior to Admission medications   Medication Sig Start Date End Date Taking? Authorizing Provider  citalopram (CELEXA) 40 MG tablet Take 40 mg by mouth daily.   Yes Historical Provider, MD  omeprazole (PRILOSEC) 40 MG capsule Take 1 capsule (40 mg total) by mouth 2 (two) times daily before a meal. 06/28/13   Barton Dubois, MD    No current facility-administered medications for this encounter.   No current outpatient prescriptions on file.   Facility-Administered Medications Ordered in Other Encounters  Medication Dose Route Frequency Provider Last Rate Last Dose  . cefTRIAXone (ROCEPHIN) 1 g in dextrose 5 % 50 mL IVPB  1 g Intravenous Q24H Shanda Howells, MD   1 g at 10/30/13 1904  . citalopram (CELEXA) tablet 40 mg  40 mg Oral Daily Shanda Howells, MD   40 mg at 10/31/13 0947  . feeding supplement (NEPRO  CARB STEADY) liquid 237 mL  237 mL Oral TID BM Dagmar Hait, RD      . heparin injection 5,000 Units  5,000 Units Subcutaneous 3 times per day Shanda Howells, MD   5,000 Units at 10/31/13 0536  . levofloxacin (LEVAQUIN) IVPB 500 mg  500 mg Intravenous Q24H Sharyn Creamer, MD      . pantoprazole (PROTONIX) EC tablet 80 mg  80 mg Oral Daily Shanda Howells, MD   80 mg at 10/31/13 0947  . sodium bicarbonate 150 mEq in dextrose 5 %  1,000 mL infusion   Intravenous Continuous Bonnielee Haff, MD 75 mL/hr at 10/31/13 0947      Allergies as of 05/21/2013  . (No Known Allergies)    Family History  Problem Relation Age of Onset  . Diabetes Mellitus II Other   . Hypertension Other   . Bladder Cancer Neg Hx     History   Social History  . Marital Status: Married    Spouse Name: N/A    Number of Children: N/A  . Years of Education: N/A   Occupational History  . Not on file.   Social History Main Topics  . Smoking status: Former Smoker -- 1.00 packs/day for 16 years    Types: Cigarettes    Quit date: 01/20/1992  . Smokeless tobacco: Former Systems developer    Types: [place] date: 01/20/1992  . Alcohol Use: No  . Drug Use: No  . Sexual Activity: Not Currently   Other Topics Concern  . Not on file   Social History Narrative  . No narrative on file    Review of Systems: Gen: Denies any fever, chills, sweats, anorexia, + fatigue, weakness, malaise, weight loss, and sleep disorder HEENT: No visual complaints, No history of Retinopathy. Normal external appearance No Epistaxis or Sore throat. No sinusitis.   CV: Denies chest pain, angina, palpitations, syncope, orthopnea, PND, peripheral edema, and claudication. Resp: Denies dyspnea at rest, dyspnea with exercise, cough, sputum, wheezing, coughing up blood, and pleurisy. GI: + vomiting Denies blood, jaundice, and fecal incontinence.   Denies dysphagia or odynophagia. GU : Bladder CA and Perc Neph right MS: Denies joint pain, limitation of movement, and swelling, stiffness, low back pain, extremity pain. Denies muscle weakness, cramps, atrophy.  No use of non steroidal antiinflammatory drugs. Derm: Denies rash, itching, dry skin, hives, moles, warts, or unhealing ulcers.  Psych: Denies depression, anxiety, memory loss, suicidal ideation, hallucinations, paranoia, and confusion. Heme: Bladder cancer Neuro: No headache.  No diplopia. No dysarthria.  No dysphasia.  No  history of CVA.  No Seizures. No paresthesias.  No weakness. Endocrine No DM.  No Thyroid disease.  No Adrenal disease.  Physical Exam: Vital signs in last 24 hours:   Last BM Date: 05/23/13 General:   Alert,  Well-developed, well-nourished, pleasant and cooperative in NAD Head:  Normocephalic and atraumatic. Eyes:  Sclera clear, no icterus.   Conjunctiva pink. Ears:  Normal auditory acuity. Nose:  No deformity, discharge,  or lesions. Mouth:  No deformity or lesions, dentition normal. Neck:  Supple; no masses or thyromegaly. JVP not elevated Lungs:  Clear throughout to auscultation.   No wheezes, crackles, or rhonchi. No acute distress. Heart:  Regular rate and rhythm; no murmurs, clicks, rubs,  or gallops. Abdomen:  Suprapubic tenderness. No masses, hepatosplenomegaly or hernias noted. Normal bowel sounds, without guarding, and without rebound.   Right nephrostomy Msk:  Symmetrical without gross deformities. Normal posture. Pulses:  No carotid, renal, femoral bruits. DP and PT symmetrical and equal Extremities:  Without clubbing or edema. Neurologic:  Alert and  oriented x4;  grossly normal neurologically. Skin:  Intact without significant lesions or rashes. Cervical Nodes:  No significant cervical adenopathy. Psych:  Alert and cooperative. Normal mood and affect.  Intake/Output from previous day:   Intake/Output this shift:    Lab Results:  Recent Labs  10/30/13 1615 10/30/13 1919 10/31/13 0641  WBC 10.5 9.5 8.2  HGB 11.4* 10.4* 10.3*  HCT 35.8* 33.6* 32.6*  PLT 395 346 346   BMET  Recent Labs  10/30/13 1615 10/30/13 1919 10/30/13 1931 10/31/13 0641  NA 135*  --   --  137  K 4.5  --   --  4.3  CL 110  --   --  114*  CO2 9*  --   --  9*  GLUCOSE 99  --   --  81  BUN 68*  --   --  65*  CREATININE 3.13* 2.81*  --  3.13*  CALCIUM 11.6*  --  10.5 10.8*   LFT  Recent Labs  10/31/13 0641  PROT 6.2  ALBUMIN 2.9*  AST 8  ALT 8  ALKPHOS 50  BILITOT <0.2*    PT/INR No results found for this basename: LABPROT, INR,  in the last 72 hours Hepatitis Panel No results found for this basename: HEPBSAG, HCVAB, HEPAIGM, HEPBIGM,  in the last 72 hours  Studies/Results: Ct Abdomen Pelvis Wo Contrast  10/30/2013   CLINICAL DATA:  Patient status post recent placement of right percutaneous nephroureteral stent. Lower abdominal pain.  EXAM: CT ABDOMEN AND PELVIS WITHOUT CONTRAST  TECHNIQUE: Multidetector CT imaging of the abdomen and pelvis was performed following the standard protocol without IV contrast.  COMPARISON:  CT abdomen pelvis 05/21/2013.  FINDINGS: Visualization of the lower thorax demonstrates minimal dependent atelectasis. Normal heart size.  Lack of intravenous contrast material limits evaluation of the solid organ parenchyma.  Noncontrast enhanced liver is unremarkable. The gallbladder is contracted. The spleen, pancreas bilateral adrenal glands are unremarkable.  Re- demonstrated right-sided nephrolithiasis. Interval removal of left nephroureteral stent with mild left hydroureteronephrosis. Interval placement of right percutaneous nephroureteral stent coiled in the right renal collecting system and distally within the neobladder. Extensive fat stranding about the right renal collecting system.  Neobladder visualized within the pelvis, slightly more distended than on prior examination. Small amount of gas anteriorly within the neobladder.  Normal caliber abdominal aorta. No retroperitoneal lymphadenopathy. Extensive pelvic surgical clips. Patient status post cystectomy.  Stool throughout the colon. Normal appendix. No evidence for bowel obstruction. No free fluid or free intraperitoneal air.  Lower lumbar spine degenerative change with bilateral L4 pars defects and grade 1 anterolisthesis of L4 on L5. Re- demonstrated T11 vertebral compression fracture.  IMPRESSION: 1. Patient status post removal of left percutaneous nephroureteral stent. There is persistent  mild left hydroureteronephrosis to the level of the neobladder. 2. Interval insertion of right percutaneous nephroureteral stent with the distal aspect coiled in the neobladder. There is extensive stranding about the right renal collecting system and right renal hilum which is nonspecific and may be postprocedural in etiology. An infectious process could appear similar. There is persistent moderate right hydronephrosis. 3. Gas within neobladder, potentially post procedural in etiology. Infection not excluded. Interval removal of suprapubic catheter. Neobladder appears more distended than on prior examination.   Electronically Signed   By: Lovey Newcomer M.D.   On: 10/30/2013 20:36  X-ray Chest Pa And Lateral   10/30/2013   CLINICAL DATA:  Weakness.  EXAM: CHEST  2 VIEW  COMPARISON:  Chest x-rays dated 06/25/2013 and 05/24/2013  FINDINGS: Heart size and pulmonary vascularity are normal and the lungs are clear. No acute osseous abnormality. Old compression fracture in the lower thoracic spine, stable.  IMPRESSION: No acute abnormality.   Electronically Signed   By: Rozetta Nunnery M.D.   On: 10/30/2013 20:28   Ct Head Wo Contrast  10/30/2013   CLINICAL DATA:  Weakness. The patient has fallen twice and struck his head. No loss of consciousness.  EXAM: CT HEAD WITHOUT CONTRAST  TECHNIQUE: Contiguous axial images were obtained from the base of the skull through the vertex without intravenous contrast.  COMPARISON:  CT scan dated 10/28/2005  FINDINGS: No mass lesion. No midline shift. No acute hemorrhage or hematoma. No extra-axial fluid collections. No evidence of acute infarction. Brain parenchyma is normal. Osseous structures are normal.  IMPRESSION: Normal exam.   Electronically Signed   By: Rozetta Nunnery M.D.   On: 10/30/2013 20:17    Assessment/Plan:  Very interesting man with chronic renal insufficiency and followed by Dr Moshe Cipro. He has a very complicated urological history and has been follow closely by  his urologist. He has a fairly profound acidosis that appears to be a non gap. He is being repleted with bicarbonate at this time that is appropriate. This appears to be worse than some of the baseline acidosis.  Chronic Renal failure appears relatively stable agree with rehydration will check urine sediment . Acute on Chronic changes could be simply secondary to dehydration of intersitial nephritis baseline creatinine 1.5. There is also the possibility that obstruction is playing a role and would appreciate urology assistance  Anemia treated with procrit  Metabolic Acidosis  Baseline mild with some worsening. Possible renal wasting of bicarbonate. Also has neobladder that could be leading to exchange of chloride with bicarbonate in urine and causing the acidosis. Treatment would be replacement   LOS: 4 Evona Westra W @TODAY @12 :43 PM

## 2013-10-31 NOTE — Progress Notes (Signed)
INITIAL NUTRITION ASSESSMENT  DOCUMENTATION CODES Per approved criteria  -Severe malnutrition in the context of chronic illness  Pt meets criteria for severe MALNUTRITION in the context of chronic illness as evidenced by 20% weight loss in <6 months and reported intake meeting <75% of estimated needs for >1 month.  INTERVENTION: Nepro Shake po TID, each supplement provides 425 kcal and 19 grams protein  NUTRITION DIAGNOSIS: Inadequate oral intake related to bladder cancer as evidenced by wt loss and reported intake less than estimated needs.   Goal: Pt to meet >/= 90% of their estimated nutrition needs   Monitor:  Weight trends, po intake, acceptance of supplements  Reason for Assessment: MST  53 y.o. male  Admitting Dx: AKI (acute kidney injury)  ASSESSMENT: 53 y.o. year old male with significant past medical history of bladder cancer s/p bladder resection w/ neo bladder placement and R sided ureteral stents-followed at cancer centers or [place] in [place], HTN, recurrent UTI presenting with weakness, AKI, AGMA. Pt states that he went to [facility] 2 weeks ago for ureteral stent change. Pt states that since about 4 days after the procedure, he has had progressive malaise, weakness, fatigue.   - Per pt's wife, pt has lost about 40 lbs since February. She reports that he often throws up after eating or drinking. She said that he was drinking Ensure supplements, but that they cause his potassium to be high. She was interested in trying Nepro Shakes because his doctor had mentioned them. She had questions on where to purchase them. Questions were answered. Pt is scheduled to meet with nephrologist today.  K and Na WNL  Height: Ht Readings from Last 1 Encounters:  10/30/13 5\' 9"  (1.753 m)    Weight: Wt Readings from Last 1 Encounters:  10/30/13 169 lb 11.2 oz (76.975 kg)    Ideal Body Weight: 70.7 kg  % Ideal Body Weight: 109%  Wt Readings from Last 10 Encounters:  10/30/13  169 lb 11.2 oz (76.975 kg)  09/24/13 170 lb (77.111 kg)  08/29/13 173 lb (78.472 kg)  08/29/13 173 lb 12.8 oz (78.835 kg)  07/24/13 183 lb (83.008 kg)  06/25/13 180 lb 1.6 oz (81.693 kg)  05/30/13 197 lb (89.359 kg)  05/23/13 197 lb 1.5 oz (89.4 kg)  05/11/13 211 lb 3.2 oz (95.8 kg)  10/27/12 209 lb (94.802 kg)    Usual Body Weight: 211 lbs  % Usual Body Weight: 80%  BMI:  Body mass index is 25.05 kg/(m^2).  Estimated Nutritional Needs: Kcal: 2200-2400 Protein: 110-120 g Fluid: 1200 mL/day  Skin: WNL  Diet Order: Renal  EDUCATION NEEDS: -Education needs addressed   Intake/Output Summary (Last 24 hours) at 10/31/13 1218 Last data filed at 10/31/13 0900  Gross per 24 hour  Intake    600 ml  Output      0 ml  Net    600 ml    Last BM: none recorded   Labs:   Recent Labs Lab 10/30/13 1615 10/30/13 1919 10/31/13 0641  NA 135*  --  137  K 4.5  --  4.3  CL 110  --  114*  CO2 9*  --  9*  BUN 68*  --  65*  CREATININE 3.13* 2.81* 3.13*  CALCIUM 11.6*  --  10.8*  GLUCOSE 99  --  81    CBG (last 3)   Recent Labs  10/30/13 1620  GLUCAP 115*    Scheduled Meds: . cefTRIAXone (ROCEPHIN)  IV  1 g  Intravenous Q24H  . citalopram  40 mg Oral Daily  . heparin  5,000 Units Subcutaneous 3 times per day  . pantoprazole  80 mg Oral Daily    Continuous Infusions: .  sodium bicarbonate  infusion 1000 mL 75 mL/hr at 10/31/13 [id number]    Past Medical History  Diagnosis Date  . Gout     recent flair -bilateral feet--  STABLE  . Frequency of urination   . Urgency incontinence   . Nocturia   . Diabetes mellitus type 2, diet-controlled PT STOPPED TAKING METFORMIN--  HAS BEEN WATCHING DIET, EXERCISING AND LOSING WT  . Bladder cancer 2013  . Complication of anesthesia     agitation w/awakening in 9/13,OK 01/24/12  . Hypertension   . Anxiety   . Depression   . GERD (gastroesophageal reflux disease)     Past Surgical History  Procedure Laterality Date  . Ulner  artery repair Right 2009    rt -trauma /thrombectomy,repair  . Transurethral resection of bladder tumor  12/15/2011    Procedure: TRANSURETHRAL RESECTION OF BLADDER TUMOR (TURBT);  Surgeon: Molli Hazard, MD;  Location: [facility];  Service: Urology;  Laterality: N/A;  2 HRS   . Lumbar laminectomy  06-02-2005    W/ RESECTION NERVE ROOT  L4  -  L5  . Transurethral resection of bladder tumor  01/24/2012    Procedure: TRANSURETHRAL RESECTION OF BLADDER TUMOR (TURBT);  Surgeon: Molli Hazard, MD;  Location: [facility];  Service: Urology;  Laterality: N/A;  90 MIN   . Cystoscopy with urethral dilatation  01/24/2012    Procedure: CYSTOSCOPY WITH URETHRAL DILATATION;  Surgeon: Molli Hazard, MD;  Location: [facility];  Service: Urology;  Laterality: N/A;  . Transurethral resection of bladder tumor N/A 07/10/2012    Procedure: TRANSURETHRAL RESECTION OF BLADDER TUMOR (TURBT);  Surgeon: Molli Hazard, MD;  Location[facility];  Service: Urology;  Laterality: N/A;  . Cystoscopy w/ retrogrades Bilateral 07/10/2012    Procedure: CYSTOSCOPY WITH RETROGRADE PYELOGRAM;  Surgeon: Molli Hazard, MD;  Location: [facility];  Service: Urology;  Laterality: Bilateral;  . Back surgery      Terrace Arabia RD, LDN

## 2013-11-01 DIAGNOSIS — N289 Disorder of kidney and ureter, unspecified: Secondary | ICD-10-CM

## 2013-11-01 LAB — CBC WITH DIFFERENTIAL/PLATELET
Basophils Absolute: 0 10*3/uL (ref 0.0–0.1)
Basophils Relative: 0 % (ref 0–1)
EOS ABS: 0.1 10*3/uL (ref 0.0–0.7)
Eosinophils Relative: 2 % (ref 0–5)
HCT: 30 % — ABNORMAL LOW (ref 39.0–52.0)
HEMOGLOBIN: 9.5 g/dL — AB (ref 13.0–17.0)
LYMPHS PCT: 19 % (ref 12–46)
Lymphs Abs: 1.6 10*3/uL (ref 0.7–4.0)
MCH: 26.5 pg (ref 26.0–34.0)
MCHC: 31.7 g/dL (ref 30.0–36.0)
MCV: 83.8 fL (ref 78.0–100.0)
Monocytes Absolute: 0.7 10*3/uL (ref 0.1–1.0)
Monocytes Relative: 9 % (ref 3–12)
NEUTROS ABS: 5.7 10*3/uL (ref 1.7–7.7)
NEUTROS PCT: 70 % (ref 43–77)
PLATELETS: 322 10*3/uL (ref 150–400)
RBC: 3.58 MIL/uL — AB (ref 4.22–5.81)
RDW: 18.5 % — ABNORMAL HIGH (ref 11.5–15.5)
WBC: 8.1 10*3/uL (ref 4.0–10.5)

## 2013-11-01 LAB — COMPREHENSIVE METABOLIC PANEL
ALT: 6 U/L (ref 0–53)
AST: 9 U/L (ref 0–37)
Albumin: 2.7 g/dL — ABNORMAL LOW (ref 3.5–5.2)
Alkaline Phosphatase: 46 U/L (ref 39–117)
Anion gap: 15 (ref 5–15)
BUN: 64 mg/dL — ABNORMAL HIGH (ref 6–23)
CALCIUM: 10.1 mg/dL (ref 8.4–10.5)
CO2: 11 mEq/L — ABNORMAL LOW (ref 19–32)
CREATININE: 2.94 mg/dL — AB (ref 0.50–1.35)
Chloride: 110 mEq/L (ref 96–112)
GFR, EST AFRICAN AMERICAN: 26 mL/min — AB (ref >90)
GFR, EST NON AFRICAN AMERICAN: 22 mL/min — AB (ref >90)
GLUCOSE: 92 mg/dL (ref 70–99)
Potassium: 3.7 mEq/L (ref 3.7–5.3)
Sodium: 136 mEq/L — ABNORMAL LOW (ref 137–147)
Total Bilirubin: <0.2 mg/dL — ABNORMAL LOW (ref 0.3–1.2)
Total Protein: 6 g/dL (ref 6.0–8.3)

## 2013-11-01 NOTE — Progress Notes (Signed)
[facility] ROUNDING NOTE   Subjective:   Interval History: output from perc neph today  Objective:  Vital signs in last 24 hours:  Temp:  [98 F (36.7 C)-99 F (37.2 C)] 98.1 F (36.7 C) (07/30 1001) Pulse Rate:  [73-79] 73 (07/30 1001) Resp:  [18-19] 19 (07/30 1001) BP: (103-126)/(52-69) 103/63 mmHg (07/30 1001) SpO2:  [97 %-99 %] 97 % (07/30 1001) Weight:  [77.111 kg (170 lb)] 77.111 kg (170 lb) (07/29 2046)  Weight change: 0.136 kg (4.8 oz) Filed Weights   10/30/13 2046 10/31/13 2046  Weight: 76.975 kg (169 lb 11.2 oz) 77.111 kg (170 lb)    Intake/Output: I/O last 3 completed shifts: In: 2150 [P.O.:1200; I.V.:900; IV Piggyback:50] Out: 725 [Urine:725]   Intake/Output this shift:  Total I/O In: -  Out: 200 [Urine:200]  CVS- RRR RS- CTA ABD- BS present soft non-distended right nephrostomy EXT- no edema   Basic Metabolic Panel:  Recent Labs Lab 10/30/13 1615 10/30/13 1919 10/30/13 1931 10/31/13 0641 11/01/13 0650  NA 135*  --   --  137 136*  K 4.5  --   --  4.3 3.7  CL 110  --   --  114* 110  CO2 9*  --   --  9* 11*  GLUCOSE 99  --   --  81 92  BUN 68*  --   --  65* 64*  CREATININE 3.13* 2.81*  --  3.13* 2.94*  CALCIUM 11.6*  --  10.5 10.8* 10.1    Liver Function Tests:  Recent Labs Lab 10/30/13 1615 10/31/13 0641 11/01/13 0650  AST 11 8 9   ALT 8 8 6   ALKPHOS 57 50 46  BILITOT <0.2* <0.2* <0.2*  PROT 7.4 6.2 6.0  ALBUMIN 3.5 2.9* 2.7*   No results found for this basename: LIPASE, AMYLASE,  in the last 168 hours No results found for this basename: AMMONIA,  in the last 168 hours  CBC:  Recent Labs Lab 10/30/13 1615 10/30/13 1919 10/31/13 0641 11/01/13 0650  WBC 10.5 9.5 8.2 8.1  NEUTROABS 8.2*  --  5.6 5.7  HGB 11.4* 10.4* 10.3* 9.5*  HCT 35.8* 33.6* 32.6* 30.0*  MCV 84.4 85.1 86.2 83.8  PLT 395 346 346 322    Cardiac Enzymes:  Recent Labs Lab 10/30/13 1615  TROPONINI <0.30    BNP: No components found  with this basename: POCBNP,   CBG:  Recent Labs Lab 10/30/13 1620  GLUCAP 115*    Microbiology: Results for orders placed during the hospital encounter of 10/30/13  CULTURE, BLOOD (ROUTINE X 2)     Status: None   Collection Time    10/30/13  7:19 PM      Result Value Ref Range Status   Specimen Description BLOOD ARM RIGHT   Final   Special Requests BOTTLES DRAWN AEROBIC AND ANAEROBIC 5CCBLUE 3CCRED   Final   Culture  Setup Time     Final   Value: 10/31/2013 00:21     Performed at [facility]   Culture     Final   Value:        BLOOD CULTURE RECEIVED NO GROWTH TO DATE CULTURE WILL BE HELD FOR 5 DAYS BEFORE ISSUING A FINAL NEGATIVE REPORT     Performed at [facility]   Report Status PENDING   Incomplete  CULTURE, BLOOD (ROUTINE X 2)     Status: None   Collection Time    10/30/13  7:31 PM  Result Value Ref Range Status   Specimen Description BLOOD RIGHT WRIST   Final   Special Requests BOTTLES DRAWN AEROBIC AND ANAEROBIC 6CC   Final   Culture  Setup Time     Final   Value: 10/31/2013 00:21     Performed at [facility]   Culture     Final   Value:        BLOOD CULTURE RECEIVED NO GROWTH TO DATE CULTURE WILL BE HELD FOR 5 DAYS BEFORE ISSUING A FINAL NEGATIVE REPORT     Performed at [facility]   Report Status PENDING   Incomplete    Coagulation Studies: No results found for this basename: LABPROT, INR,  in the last 72 hours  Urinalysis:  Recent Labs  10/30/13 1703  COLORURINE YELLOW  LABSPEC 1.012  PHURINE 7.5  GLUCOSEU NEGATIVE  HGBUR LARGE*  BILIRUBINUR SMALL*  KETONESUR NEGATIVE  PROTEINUR 100*  UROBILINOGEN 0.2  NITRITE NEGATIVE  LEUKOCYTESUR LARGE*      Imaging: Ct Abdomen Pelvis Wo Contrast  10/30/2013   CLINICAL DATA:  Patient status post recent placement of right percutaneous nephroureteral stent. Lower abdominal pain.  EXAM: CT ABDOMEN AND PELVIS WITHOUT CONTRAST  TECHNIQUE: Multidetector CT imaging of the  abdomen and pelvis was performed following the standard protocol without IV contrast.  COMPARISON:  CT abdomen pelvis 05/21/2013.  FINDINGS: Visualization of the lower thorax demonstrates minimal dependent atelectasis. Normal heart size.  Lack of intravenous contrast material limits evaluation of the solid organ parenchyma.  Noncontrast enhanced liver is unremarkable. The gallbladder is contracted. The spleen, pancreas bilateral adrenal glands are unremarkable.  Re- demonstrated right-sided nephrolithiasis. Interval removal of left nephroureteral stent with mild left hydroureteronephrosis. Interval placement of right percutaneous nephroureteral stent coiled in the right renal collecting system and distally within the neobladder. Extensive fat stranding about the right renal collecting system.  Neobladder visualized within the pelvis, slightly more distended than on prior examination. Small amount of gas anteriorly within the neobladder.  Normal caliber abdominal aorta. No retroperitoneal lymphadenopathy. Extensive pelvic surgical clips. Patient status post cystectomy.  Stool throughout the colon. Normal appendix. No evidence for bowel obstruction. No free fluid or free intraperitoneal air.  Lower lumbar spine degenerative change with bilateral L4 pars defects and grade 1 anterolisthesis of L4 on L5. Re- demonstrated T11 vertebral compression fracture.  IMPRESSION: 1. Patient status post removal of left percutaneous nephroureteral stent. There is persistent mild left hydroureteronephrosis to the level of the neobladder. 2. Interval insertion of right percutaneous nephroureteral stent with the distal aspect coiled in the neobladder. There is extensive stranding about the right renal collecting system and right renal hilum which is nonspecific and may be postprocedural in etiology. An infectious process could appear similar. There is persistent moderate right hydronephrosis. 3. Gas within neobladder, potentially post  procedural in etiology. Infection not excluded. Interval removal of suprapubic catheter. Neobladder appears more distended than on prior examination.   Electronically Signed   By: Lovey Newcomer M.D.   On: 10/30/2013 20:36   X-ray Chest Pa And Lateral   10/30/2013   CLINICAL DATA:  Weakness.  EXAM: CHEST  2 VIEW  COMPARISON:  Chest x-rays dated 06/25/2013 and 05/24/2013  FINDINGS: Heart size and pulmonary vascularity are normal and the lungs are clear. No acute osseous abnormality. Old compression fracture in the lower thoracic spine, stable.  IMPRESSION: No acute abnormality.   Electronically Signed   By: Rozetta Nunnery M.D.   On: 10/30/2013 20:28  Ct Head Wo Contrast  10/30/2013   CLINICAL DATA:  Weakness. The patient has fallen twice and struck his head. No loss of consciousness.  EXAM: CT HEAD WITHOUT CONTRAST  TECHNIQUE: Contiguous axial images were obtained from the base of the skull through the vertex without intravenous contrast.  COMPARISON:  CT scan dated 10/28/2005  FINDINGS: No mass lesion. No midline shift. No acute hemorrhage or hematoma. No extra-axial fluid collections. No evidence of acute infarction. Brain parenchyma is normal. Osseous structures are normal.  IMPRESSION: Normal exam.   Electronically Signed   By: Rozetta Nunnery M.D.   On: 10/30/2013 20:17     Medications:   .  sodium bicarbonate  infusion 1000 mL 75 mL/hr at 11/01/13 0231   . cefTRIAXone (ROCEPHIN)  IV  1 g Intravenous Q24H  . citalopram  40 mg Oral Daily  . feeding supplement (NEPRO CARB STEADY)  237 mL Oral TID BM  . heparin  5,000 Units Subcutaneous 3 times per day  . pantoprazole  80 mg Oral Daily     Assessment/ Plan:   Acute on CKD appears to be improving creatinine decreasing  Anemia  stable  Metabolic acidosis appears to be a little better   LOS: 2 [name] [name] W @TODAY @12 :16 PM

## 2013-11-01 NOTE — Progress Notes (Signed)
[facility] PROGRESS NOTE  RONNALD BIDO [id number] DOB: 12-08-1960 DOA: 10/30/2013  PCP: Florina Ou, MD  Brief HPI: [id number] with complicated urological history with bladder cancer s/p surgery, neobladder, ureteral stents for hydronephrosis at [facility] [place], [place], who presents with weakness and found to have AKI.  Past medical history:  Past Medical History  Diagnosis Date  . Gout     recent flair -bilateral feet--  STABLE  . Frequency of urination   . Urgency incontinence   . Nocturia   . Diabetes mellitus type 2, diet-controlled PT STOPPED TAKING METFORMIN--  HAS BEEN WATCHING DIET, EXERCISING AND LOSING WT  . Bladder cancer 2013  . Complication of anesthesia     agitation w/awakening in 9/13,OK 01/24/12  . Hypertension   . Anxiety   . Depression   . GERD (gastroesophageal reflux disease)     Consultants: Nephrology, Urology  Procedures: None  Antibiotics: Ceftriaxone 7/28-->  Subjective: Patient feels better this AM. No nausea.   Objective: Vital Signs  Filed Vitals:   10/31/13 1836 10/31/13 2046 11/01/13 0500 11/01/13 1001  BP: 117/69 126/67 116/52 103/63  Pulse: 79 76 78 73  Temp: 99 F (37.2 C) 98.2 F (36.8 C) 98 F (36.7 C) 98.1 F (36.7 C)  TempSrc: Oral Oral Oral Oral  Resp: 19 18 18 19   Height:      Weight:  77.111 kg (170 lb)    SpO2: 97% 98% 99% 97%    Intake/Output Summary (Last 24 hours) at 11/01/13 1232 Last data filed at 11/01/13 1055  Gross per 24 hour  Intake   1550 ml  Output    925 ml  Net    625 ml   Filed Weights   10/30/13 2046 10/31/13 2046  Weight: 76.975 kg (169 lb 11.2 oz) 77.111 kg (170 lb)    General appearance: alert, cooperative, appears stated age, fatigued and no distress Throat: dry mm Back: SQ Nephrostomy tube noted. Now with drainage bag.  Resp: clear to auscultation bilaterally Cardio: regular rate and rhythm, S1, S2 normal, no murmur, click, rub or gallop GI:  soft, non-tender; bowel sounds normal; no masses,  no organomegaly Extremities: extremities normal, atraumatic, no cyanosis or edema Neurologic: Alert and oriented x 3. No focal deficits.  Lab Results:  Basic Metabolic Panel:  Recent Labs Lab 10/30/13 1615 10/30/13 1919 10/30/13 1931 10/31/13 0641 11/01/13 0650  NA 135*  --   --  137 136*  K 4.5  --   --  4.3 3.7  CL 110  --   --  114* 110  CO2 9*  --   --  9* 11*  GLUCOSE 99  --   --  81 92  BUN 68*  --   --  65* 64*  CREATININE 3.13* 2.81*  --  3.13* 2.94*  CALCIUM 11.6*  --  10.5 10.8* 10.1   Liver Function Tests:  Recent Labs Lab 10/30/13 1615 10/31/13 0641 11/01/13 0650  AST 11 8 9   ALT 8 8 6   ALKPHOS 57 50 46  BILITOT <0.2* <0.2* <0.2*  PROT 7.4 6.2 6.0  ALBUMIN 3.5 2.9* 2.7*   CBC:  Recent Labs Lab 10/30/13 1615 10/30/13 1919 10/31/13 0641 11/01/13 0650  WBC 10.5 9.5 8.2 8.1  NEUTROABS 8.2*  --  5.6 5.7  HGB 11.4* 10.4* 10.3* 9.5*  HCT 35.8* 33.6* 32.6* 30.0*  MCV 84.4 85.1 86.2 83.8  PLT 395 346 346 322   Cardiac  Enzymes:  Recent Labs Lab 10/30/13 1615  TROPONINI <0.30   BNP (last 3 results)  Recent Labs  10/30/13 1919  PROBNP 96.4   CBG:  Recent Labs Lab 10/30/13 1620  GLUCAP 115*     Studies/Results: Ct Abdomen Pelvis Wo Contrast  10/30/2013   CLINICAL DATA:  Patient status post recent placement of right percutaneous nephroureteral stent. Lower abdominal pain.  EXAM: CT ABDOMEN AND PELVIS WITHOUT CONTRAST  TECHNIQUE: Multidetector CT imaging of the abdomen and pelvis was performed following the standard protocol without IV contrast.  COMPARISON:  CT abdomen pelvis 05/21/2013.  FINDINGS: Visualization of the lower thorax demonstrates minimal dependent atelectasis. Normal heart size.  Lack of intravenous contrast material limits evaluation of the solid organ parenchyma.  Noncontrast enhanced liver is unremarkable. The gallbladder is contracted. The spleen, pancreas bilateral  adrenal glands are unremarkable.  Re- demonstrated right-sided nephrolithiasis. Interval removal of left nephroureteral stent with mild left hydroureteronephrosis. Interval placement of right percutaneous nephroureteral stent coiled in the right renal collecting system and distally within the neobladder. Extensive fat stranding about the right renal collecting system.  Neobladder visualized within the pelvis, slightly more distended than on prior examination. Small amount of gas anteriorly within the neobladder.  Normal caliber abdominal aorta. No retroperitoneal lymphadenopathy. Extensive pelvic surgical clips. Patient status post cystectomy.  Stool throughout the colon. Normal appendix. No evidence for bowel obstruction. No free fluid or free intraperitoneal air.  Lower lumbar spine degenerative change with bilateral L4 pars defects and grade 1 anterolisthesis of L4 on L5. Re- demonstrated T11 vertebral compression fracture.  IMPRESSION: 1. Patient status post removal of left percutaneous nephroureteral stent. There is persistent mild left hydroureteronephrosis to the level of the neobladder. 2. Interval insertion of right percutaneous nephroureteral stent with the distal aspect coiled in the neobladder. There is extensive stranding about the right renal collecting system and right renal hilum which is nonspecific and may be postprocedural in etiology. An infectious process could appear similar. There is persistent moderate right hydronephrosis. 3. Gas within neobladder, potentially post procedural in etiology. Infection not excluded. Interval removal of suprapubic catheter. Neobladder appears more distended than on prior examination.   Electronically Signed   By: Lovey Newcomer M.D.   On: 10/30/2013 20:36   X-ray Chest Pa And Lateral   10/30/2013   CLINICAL DATA:  Weakness.  EXAM: CHEST  2 VIEW  COMPARISON:  Chest x-rays dated 06/25/2013 and 05/24/2013  FINDINGS: Heart size and pulmonary vascularity are normal  and the lungs are clear. No acute osseous abnormality. Old compression fracture in the lower thoracic spine, stable.  IMPRESSION: No acute abnormality.   Electronically Signed   By: Rozetta Nunnery M.D.   On: 10/30/2013 20:28   Ct Head Wo Contrast  10/30/2013   CLINICAL DATA:  Weakness. The patient has fallen twice and struck his head. No loss of consciousness.  EXAM: CT HEAD WITHOUT CONTRAST  TECHNIQUE: Contiguous axial images were obtained from the base of the skull through the vertex without intravenous contrast.  COMPARISON:  CT scan dated 10/28/2005  FINDINGS: No mass lesion. No midline shift. No acute hemorrhage or hematoma. No extra-axial fluid collections. No evidence of acute infarction. Brain parenchyma is normal. Osseous structures are normal.  IMPRESSION: Normal exam.   Electronically Signed   By: Rozetta Nunnery M.D.   On: 10/30/2013 20:17    Medications:  Scheduled: . cefTRIAXone (ROCEPHIN)  IV  1 g Intravenous Q24H  . citalopram  40 mg Oral Daily  .  feeding supplement (NEPRO CARB STEADY)  237 mL Oral TID BM  . heparin  5,000 Units Subcutaneous 3 times per day  . pantoprazole  80 mg Oral Daily   Continuous: .  sodium bicarbonate  infusion 1000 mL 75 mL/hr at 11/01/13 0231   PRN:  Assessment/Plan:  Principal Problem:   AKI (acute kidney injury) Active Problems:   Bladder cancer   UTI (lower urinary tract infection)   Weakness   Metabolic acidosis, increased anion gap   Protein-calorie malnutrition, severe    Acute Kidney Injury on CKD Stage 3 Patient's urological issues predominantly the etiology. However he seems to have developed CKD as well and now with acute injury. Creatinine improved today. Will continue IVF. Appreciate Nephrology input. Monitor urine output.  Metabolic Acidosis Likely due to renal failure. He had similar presentation earlier this year. Slightly better today. Continue bicarbonate infusion. ABG reviewed.   UTI with Renal stranding Likely due to  stent. Await urine culture. Continue ceftriaxone. Urology following.Marland Kitchen  History of Bladder cancer and Right Nephroureteral Stent Probably contributing to above. CT report reviewed. Appreciate urology input. Stent on continuous drainage.   Hypercalcemia Likely due to hypovolemia. Improved today.   Normocytic Anemia Stable. Monitor.  Generalized Weakness/Fatigue Likely due to acute medical issues. No focal deficits. Improving. OOB and mobilize. PT/OT.   Code Status: Full Code  DVT Prophylaxis: Heparin    Family Communication: Discussed with patient and his wife  Disposition Plan: Not ready for discharge    LOS: 2 days   [facility] Pager [phone] 11/01/2013, 12:32 PM  If 8PM-8AM, please contact night-coverage at [email or web address], password [email or web address]   Disclaimer: This note was dictated with voice recognition software. Similar sounding words can inadvertently be transcribed and may not be corrected upon review.

## 2013-11-01 NOTE — Consult Note (Signed)
[name] Croon, MD Physician Signed Nephrology Consult Note Service date: 10/31/2013 12:43 PM  Referring Provider: No ref. provider found Primary Care Physician:  Florina Ou, MD Primary Nephrologist:  Dr. Moshe Cipro   Reason for Consultation:  Acute renal failure Metabolic acidosis HPI:He had been followed by Dr. Jasmine December of our practice for some time, and was eventually referred for possible cystectomy, after failing BCG. He was first seen at [facility] Department of urology, and eventually is referred himself to cancer treatment centers of [place] in [place], [place]. He underwent radical cystoprostatectomy, bilateral pelvic lymphadenectomy and creation of an ileal neobladder. He had a SP catheter placed by Dr Jeffie Pollock in February that has since been removed and he had a percutaneous nephrostomy  mid July Right kidney      Past Medical History   Diagnosis  Date   .  Gout         recent flair -bilateral feet--  STABLE   .  Frequency of urination     .  Urgency incontinence     .  Nocturia     .  Diabetes mellitus type 2, diet-controlled  PT STOPPED TAKING METFORMIN--  HAS BEEN WATCHING DIET, EXERCISING AND LOSING WT   .  Bladder cancer  2013   .  Complication of anesthesia         agitation w/awakening in 9/13,OK 01/24/12   .  Hypertension     .  Anxiety     .  Depression     .  GERD (gastroesophageal reflux disease)         Past Surgical History   Procedure  Laterality  Date   .  Ulner artery repair  Right  2009       rt -trauma /thrombectomy,repair   .  Transurethral resection of bladder tumor    12/15/2011       Procedure: TRANSURETHRAL RESECTION OF BLADDER TUMOR (TURBT);  Surgeon: Molli Hazard, MD;  Location: [facility];  Service: Urology;  Laterality: N/A;  2 HRS    .  Lumbar laminectomy    06-02-2005       W/ RESECTION NERVE ROOT  L4  -  L5   .  Transurethral resection of bladder tumor    01/24/2012       Procedure: TRANSURETHRAL  RESECTION OF BLADDER TUMOR (TURBT);  Surgeon: Molli Hazard, MD;  Location: [facility];  Service: Urology;  Laterality: N/A;  90 MIN    .  Cystoscopy with urethral dilatation    01/24/2012       Procedure: CYSTOSCOPY WITH URETHRAL DILATATION;  Surgeon: Molli Hazard, MD;  Location: [facility];  Service: Urology;  Laterality: N/A;   .  Transurethral resection of bladder tumor  N/A  07/10/2012       Procedure: TRANSURETHRAL RESECTION OF BLADDER TUMOR (TURBT);  Surgeon: Molli Hazard, MD;  Location: [facility];  Service: Urology;  Laterality: N/A;   .  Cystoscopy w/ retrogrades  Bilateral  07/10/2012       Procedure: CYSTOSCOPY WITH RETROGRADE PYELOGRAM;  Surgeon: Molli Hazard, MD;  Location: [facility];  Service: Urology;  Laterality: Bilateral;   .  Back surgery           Prior to Admission medications    Medication  Sig  Start Date  End Date  Taking?  Authorizing Provider   citalopram (CELEXA) 40 MG tablet  Take  40 mg by mouth daily.      Yes  Historical Provider, MD   omeprazole (PRILOSEC) 40 MG capsule  Take 1 capsule (40 mg total) by mouth 2 (two) times daily before a meal.  06/28/13      Barton Dubois, MD         No current facility-administered medications for this encounter.       No current outpatient prescriptions on file.       Facility-Administered Medications Ordered in Other Encounters   Medication  Dose  Route  Frequency  Provider  Last Rate  Last Dose   .  cefTRIAXone (ROCEPHIN) 1 g in dextrose 5 % 50 mL IVPB   1 g  Intravenous  Q24H  Shanda Howells, MD     1 g at 10/30/13 1904   .  citalopram (CELEXA) tablet 40 mg   40 mg  Oral  Daily  Shanda Howells, MD     40 mg at 10/31/13 0947   .  feeding supplement (NEPRO CARB STEADY) liquid 237 mL   237 mL  Oral  TID BM  Dagmar Hait, RD         .  heparin injection 5,000 Units   5,000 Units  Subcutaneous  3 times per day  Shanda Howells, MD      5,000 Units at 10/31/13 0536   .  levofloxacin (LEVAQUIN) IVPB 500 mg   500 mg  Intravenous  Q24H  Sharyn Creamer, MD         .  pantoprazole (PROTONIX) EC tablet 80 mg   80 mg  Oral  Daily  Shanda Howells, MD     80 mg at 10/31/13 0947   .  sodium bicarbonate 150 mEq in dextrose 5 % 1,000 mL infusion     Intravenous  Continuous  Bonnielee Haff, MD  75 mL/hr at 10/31/13 0947          Allergies as of 05/21/2013   .  (No Known Allergies)       Family History   Problem  Relation  Age of Onset   .  Diabetes Mellitus II  Other     .  Hypertension  Other     .  Bladder Cancer  Neg Hx         History       Social History   .  Marital Status:  Married       Spouse Name:  N/A       Number of Children:  N/A   .  Years of Education:  N/A       Occupational History   .  Not on file.       Social History Main Topics   .  Smoking status:  Former Smoker -- 1.00 packs/day for 16 years       Types:  Cigarettes       Quit date:  01/20/1992   .  Smokeless tobacco:  Former Systems developer       Types:  [place] date:  01/20/1992   .  Alcohol Use:  No   .  Drug Use:  No   .  Sexual Activity:  Not Currently       Other Topics  Concern   .  Not on file       Social History Narrative   .  No narrative on file      Review of  Systems: Gen: Denies any fever, chills, sweats, anorexia, + fatigue, weakness, malaise, weight loss, and sleep disorder HEENT: No visual complaints, No history of Retinopathy. Normal external appearance No Epistaxis or Sore throat. No sinusitis.    CV: Denies chest pain, angina, palpitations, syncope, orthopnea, PND, peripheral edema, and claudication. Resp: Denies dyspnea at rest, dyspnea with exercise, cough, sputum, wheezing, coughing up blood, and pleurisy. GI: + vomiting Denies blood, jaundice, and fecal incontinence.   Denies dysphagia or odynophagia. GU : Bladder CA and Perc Neph right MS: Denies joint pain, limitation of movement, and swelling,  stiffness, low back pain, extremity pain. Denies muscle weakness, cramps, atrophy.  No use of non steroidal antiinflammatory drugs. Derm: Denies rash, itching, dry skin, hives, moles, warts, or unhealing ulcers.   Psych: Denies depression, anxiety, memory loss, suicidal ideation, hallucinations, paranoia, and confusion. Heme: Bladder cancer Neuro: No headache.  No diplopia. No dysarthria.  No dysphasia.  No history of CVA.  No Seizures. No paresthesias.  No weakness. Endocrine No DM.  No Thyroid disease.  No Adrenal disease.   Physical Exam: Vital signs in last 24 hours: Last BM Date: 05/23/13 General:   Alert,  Well-developed, well-nourished, pleasant and cooperative in NAD Head:  Normocephalic and atraumatic. Eyes:  Sclera clear, no icterus.   Conjunctiva pink. Ears:  Normal auditory acuity. Nose:  No deformity, discharge,  or lesions. Mouth:  No deformity or lesions, dentition normal. Neck:  Supple; no masses or thyromegaly. JVP not elevated Lungs:  Clear throughout to auscultation.   No wheezes, crackles, or rhonchi. No acute distress. Heart:  Regular rate and rhythm; no murmurs, clicks, rubs,  or gallops. Abdomen:  Suprapubic tenderness. No masses, hepatosplenomegaly or hernias noted. Normal bowel sounds, without guarding, and without rebound.   Right nephrostomy Msk:  Symmetrical without gross deformities. Normal posture. Pulses:  No carotid, renal, femoral bruits. DP and PT symmetrical and equal Extremities:  Without clubbing or edema. Neurologic:  Alert and  oriented x4;  grossly normal neurologically. Skin:  Intact without significant lesions or rashes. Cervical Nodes:  No significant cervical adenopathy. Psych:  Alert and cooperative. Normal mood and affect.   Intake/Output from previous day: Intake/Output this shift:   Lab Results: Recent Labs   10/30/13 1615  10/30/13 1919  10/31/13 0641   WBC  10.5  9.5  8.2   HGB  11.4*  10.4*  10.3*   HCT  35.8*  33.6*  32.6*    PLT  395  346  346    BMET Recent Labs   10/30/13 1615  10/30/13 1919  10/30/13 1931  10/31/13 0641   NA  135*   --    --   137   K  4.5   --    --   4.3   CL  110   --    --   114*   CO2  9*   --    --   9*   GLUCOSE  99   --    --   81   BUN  68*   --    --   65*   CREATININE  3.13*  2.81*   --   3.13*   CALCIUM  11.6*   --   10.5  10.8*    LFT Recent Labs   10/31/13 0641   PROT  6.2   ALBUMIN  2.9*   AST  8   ALT  8   ALKPHOS  50  BILITOT  <0.2*    PT/INR No results found for this basename: LABPROT, INR,  in the last 72 hours Hepatitis Panel No results found for this basename: HEPBSAG, HCVAB, HEPAIGM, HEPBIGM,  in the last 72 hours   Studies/Results: Ct Abdomen Pelvis Wo Contrast   10/30/2013   CLINICAL DATA:  Patient status post recent placement of right percutaneous nephroureteral stent. Lower abdominal pain.  EXAM: CT ABDOMEN AND PELVIS WITHOUT CONTRAST  TECHNIQUE: Multidetector CT imaging of the abdomen and pelvis was performed following the standard protocol without IV contrast.  COMPARISON:  CT abdomen pelvis 05/21/2013.  FINDINGS: Visualization of the lower thorax demonstrates minimal dependent atelectasis. Normal heart size.  Lack of intravenous contrast material limits evaluation of the solid organ parenchyma.  Noncontrast enhanced liver is unremarkable. The gallbladder is contracted. The spleen, pancreas bilateral adrenal glands are unremarkable.  Re- demonstrated right-sided nephrolithiasis. Interval removal of left nephroureteral stent with mild left hydroureteronephrosis. Interval placement of right percutaneous nephroureteral stent coiled in the right renal collecting system and distally within the neobladder. Extensive fat stranding about the right renal collecting system.  Neobladder visualized within the pelvis, slightly more distended than on prior examination. Small amount of gas anteriorly within the neobladder.  Normal caliber abdominal aorta. No  retroperitoneal lymphadenopathy. Extensive pelvic surgical clips. Patient status post cystectomy.  Stool throughout the colon. Normal appendix. No evidence for bowel obstruction. No free fluid or free intraperitoneal air.  Lower lumbar spine degenerative change with bilateral L4 pars defects and grade 1 anterolisthesis of L4 on L5. Re- demonstrated T11 vertebral compression fracture.  IMPRESSION: 1. Patient status post removal of left percutaneous nephroureteral stent. There is persistent mild left hydroureteronephrosis to the level of the neobladder. 2. Interval insertion of right percutaneous nephroureteral stent with the distal aspect coiled in the neobladder. There is extensive stranding about the right renal collecting system and right renal hilum which is nonspecific and may be postprocedural in etiology. An infectious process could appear similar. There is persistent moderate right hydronephrosis. 3. Gas within neobladder, potentially post procedural in etiology. Infection not excluded. Interval removal of suprapubic catheter. Neobladder appears more distended than on prior examination.   Electronically Signed   By: Lovey Newcomer M.D.   On: 10/30/2013 20:36    X-ray Chest Pa And Lateral    10/30/2013   CLINICAL DATA:  Weakness.  EXAM: CHEST  2 VIEW  COMPARISON:  Chest x-rays dated 06/25/2013 and 05/24/2013  FINDINGS: Heart size and pulmonary vascularity are normal and the lungs are clear. No acute osseous abnormality. Old compression fracture in the lower thoracic spine, stable.  IMPRESSION: No acute abnormality.   Electronically Signed   By: Rozetta Nunnery M.D.   On: 10/30/2013 20:28    Ct Head Wo Contrast   10/30/2013   CLINICAL DATA:  Weakness. The patient has fallen twice and struck his head. No loss of consciousness.  EXAM: CT HEAD WITHOUT CONTRAST  TECHNIQUE: Contiguous axial images were obtained from the base of the skull through the vertex without intravenous contrast.  COMPARISON:  CT scan  dated 10/28/2005  FINDINGS: No mass lesion. No midline shift. No acute hemorrhage or hematoma. No extra-axial fluid collections. No evidence of acute infarction. Brain parenchyma is normal. Osseous structures are normal.  IMPRESSION: Normal exam.   Electronically Signed   By: Rozetta Nunnery M.D.   On: 10/30/2013 20:17     Assessment/Plan: Very interesting man with chronic renal insufficiency and followed by Dr Moshe Cipro. He has  a very complicated urological history and has been follow closely by his urologist. He has a fairly profound acidosis that appears to be a non gap. He is being repleted with bicarbonate at this time that is appropriate. This appears to be worse than some of the baseline acidosis. Chronic Renal failure appears relatively stable agree with rehydration will check urine sediment . Acute on Chronic changes could be simply secondary to dehydration of intersitial nephritis baseline creatinine 1.5. There is also the possibility that obstruction is playing a role and would appreciate urology assistance Anemia treated with procrit Metabolic Acidosis  Baseline mild with some worsening. Possible renal wasting of bicarbonate. Also has neobladder that could be leading to exchange of chloride with bicarbonate in urine and causing the acidosis. Treatment would be replacement

## 2013-11-02 LAB — COMPREHENSIVE METABOLIC PANEL
ALK PHOS: 40 U/L (ref 39–117)
ALT: 6 U/L (ref 0–53)
AST: 10 U/L (ref 0–37)
Albumin: 2.5 g/dL — ABNORMAL LOW (ref 3.5–5.2)
Anion gap: 15 (ref 5–15)
BUN: 64 mg/dL — AB (ref 6–23)
CO2: 17 meq/L — AB (ref 19–32)
Calcium: 9.1 mg/dL (ref 8.4–10.5)
Chloride: 105 mEq/L (ref 96–112)
Creatinine, Ser: 3.02 mg/dL — ABNORMAL HIGH (ref 0.50–1.35)
GFR, EST AFRICAN AMERICAN: 25 mL/min — AB (ref >90)
GFR, EST NON AFRICAN AMERICAN: 22 mL/min — AB (ref >90)
GLUCOSE: 100 mg/dL — AB (ref 70–99)
POTASSIUM: 3.1 meq/L — AB (ref 3.7–5.3)
Sodium: 137 mEq/L (ref 137–147)
Total Bilirubin: <0.2 mg/dL — ABNORMAL LOW (ref 0.3–1.2)
Total Protein: 5.9 g/dL — ABNORMAL LOW (ref 6.0–8.3)

## 2013-11-02 LAB — CBC WITH DIFFERENTIAL/PLATELET
Basophils Absolute: 0 10*3/uL (ref 0.0–0.1)
Basophils Relative: 0 % (ref 0–1)
EOS ABS: 0.1 10*3/uL (ref 0.0–0.7)
Eosinophils Relative: 1 % (ref 0–5)
HCT: 28.1 % — ABNORMAL LOW (ref 39.0–52.0)
Hemoglobin: 9 g/dL — ABNORMAL LOW (ref 13.0–17.0)
Lymphocytes Relative: 18 % (ref 12–46)
Lymphs Abs: 1.7 10*3/uL (ref 0.7–4.0)
MCH: 26.6 pg (ref 26.0–34.0)
MCHC: 32 g/dL (ref 30.0–36.0)
MCV: 83.1 fL (ref 78.0–100.0)
MONOS PCT: 10 % (ref 3–12)
Monocytes Absolute: 0.9 10*3/uL (ref 0.1–1.0)
NEUTROS PCT: 71 % (ref 43–77)
Neutro Abs: 6.5 10*3/uL (ref 1.7–7.7)
PLATELETS: 294 10*3/uL (ref 150–400)
RBC: 3.38 MIL/uL — ABNORMAL LOW (ref 4.22–5.81)
RDW: 18.5 % — AB (ref 11.5–15.5)
WBC: 9.2 10*3/uL (ref 4.0–10.5)

## 2013-11-02 MED ORDER — CITRIC ACID-SODIUM CITRATE 334-500 MG/5ML PO SOLN
15.0000 mL | Freq: Three times a day (TID) | ORAL | Status: DC
  Administered 2013-11-02 – 2013-11-03 (×5): 15 mL via ORAL
  Filled 2013-11-02 (×9): qty 15

## 2013-11-02 MED ORDER — POTASSIUM CHLORIDE 20 MEQ PO PACK
20.0000 meq | PACK | Freq: Two times a day (BID) | ORAL | Status: DC
  Administered 2013-11-02: 20 meq via ORAL
  Filled 2013-11-02 (×2): qty 1

## 2013-11-02 MED ORDER — POTASSIUM CHLORIDE CRYS ER 20 MEQ PO TBCR
20.0000 meq | EXTENDED_RELEASE_TABLET | Freq: Two times a day (BID) | ORAL | Status: AC
  Administered 2013-11-02 – 2013-11-03 (×2): 20 meq via ORAL
  Filled 2013-11-02: qty 1

## 2013-11-02 MED ORDER — POTASSIUM CHLORIDE 20 MEQ/15ML (10%) PO LIQD
20.0000 meq | Freq: Two times a day (BID) | ORAL | Status: DC
  Filled 2013-11-02: qty 15

## 2013-11-02 NOTE — Progress Notes (Signed)
[facility] ROUNDING NOTE   Subjective:   Interval History: some output  Creatinine static Objective:  Vital signs in last 24 hours:  Temp:  [98 F (36.7 C)-98.6 F (37 C)] 98.4 F (36.9 C) (07/31 0430) Pulse Rate:  [76-84] 79 (07/31 0430) Resp:  [18] 18 (07/31 0430) BP: (100-116)/(59-69) 100/59 mmHg (07/31 0430) SpO2:  [96 %-100 %] 98 % (07/31 0430) Weight:  [76 kg (167 lb 8.8 oz)-76.25 kg (168 lb 1.6 oz)] 76 kg (167 lb 8.8 oz) (07/31 0430)  Weight change: -0.862 kg (-1 lb 14.4 oz) Filed Weights   10/31/13 2046 11/01/13 2105 11/02/13 0430  Weight: 77.111 kg (170 lb) 76.25 kg (168 lb 1.6 oz) 76 kg (167 lb 8.8 oz)    Intake/Output: I/O last 3 completed shifts: In: 1310 [P.O.:360; I.V.:900; IV Piggyback:50] Out: 1100 [Urine:1100]   Intake/Output this shift:  Total I/O In: 480 [P.O.:480] Out: 200 [Urine:200]  CVS- RRR RS- CTA ABD- BS present soft non-distended EXT- no edema   Basic Metabolic Panel:  Recent Labs Lab 10/30/13 1615 10/30/13 1919  10/31/13 0641 11/01/13 0650 11/02/13 0524  NA 135*  --   --  137 136* 137  K 4.5  --   --  4.3 3.7 3.1*  CL 110  --   --  114* 110 105  CO2 9*  --   --  9* 11* 17*  GLUCOSE 99  --   --  81 92 100*  BUN 68*  --   --  65* 64* 64*  CREATININE 3.13* 2.81*  --  3.13* 2.94* 3.02*  CALCIUM 11.6*  --   < > 10.8* 10.1 9.1  < > = values in this interval not displayed.  Liver Function Tests:  Recent Labs Lab 10/30/13 1615 10/31/13 0641 11/01/13 0650 11/02/13 0524  AST 11 8 9 10   ALT 8 8 6 6   ALKPHOS 57 50 46 40  BILITOT <0.2* <0.2* <0.2* <0.2*  PROT 7.4 6.2 6.0 5.9*  ALBUMIN 3.5 2.9* 2.7* 2.5*   No results found for this basename: LIPASE, AMYLASE,  in the last 168 hours No results found for this basename: AMMONIA,  in the last 168 hours  CBC:  Recent Labs Lab 10/30/13 1615 10/30/13 1919 10/31/13 0641 11/01/13 0650 11/02/13 0524  WBC 10.5 9.5 8.2 8.1 9.2  NEUTROABS 8.2*  --  5.6 5.7 6.5   HGB 11.4* 10.4* 10.3* 9.5* 9.0*  HCT 35.8* 33.6* 32.6* 30.0* 28.1*  MCV 84.4 85.1 86.2 83.8 83.1  PLT 395 346 346 322 294    Cardiac Enzymes:  Recent Labs Lab 10/30/13 1615  TROPONINI <0.30    BNP: No components found with this basename: POCBNP,   CBG:  Recent Labs Lab 10/30/13 1620  GLUCAP 115*    Microbiology: Results for orders placed during the hospital encounter of 10/30/13  URINE CULTURE     Status: None   Collection Time    10/30/13  5:03 PM      Result Value Ref Range Status   Specimen Description URINE, RANDOM   Final   Special Requests ADD [id number]   Final   Culture  Setup Time     Final   Value: 10/30/2013 20:05     Performed at [facility]     Final   Value: >=100,000 COLONIES/ML     Performed at [facility]   Culture     Final   Value: [place]  Performed at [facility]   Report Status PENDING   Incomplete  CULTURE, BLOOD (ROUTINE X 2)     Status: None   Collection Time    10/30/13  7:19 PM      Result Value Ref Range Status   Specimen Description BLOOD ARM RIGHT   Final   Special Requests BOTTLES DRAWN AEROBIC AND ANAEROBIC 5CCBLUE 3CCRED   Final   Culture  Setup Time     Final   Value: 10/31/2013 00:21     Performed at [facility]   Culture     Final   Value:        BLOOD CULTURE RECEIVED NO GROWTH TO DATE CULTURE WILL BE HELD FOR 5 DAYS BEFORE ISSUING A FINAL NEGATIVE REPORT     Performed at [facility]   Report Status PENDING   Incomplete  CULTURE, BLOOD (ROUTINE X 2)     Status: None   Collection Time    10/30/13  7:31 PM      Result Value Ref Range Status   Specimen Description BLOOD RIGHT WRIST   Final   Special Requests BOTTLES DRAWN AEROBIC AND ANAEROBIC 6CC   Final   Culture  Setup Time     Final   Value: 10/31/2013 00:21     Performed at [facility]   Culture     Final   Value:        BLOOD CULTURE RECEIVED NO GROWTH TO DATE CULTURE WILL BE  HELD FOR 5 DAYS BEFORE ISSUING A FINAL NEGATIVE REPORT     Performed at [facility]   Report Status PENDING   Incomplete  URINE CULTURE     Status: None   Collection Time    10/30/13  9:34 PM      Result Value Ref Range Status   Specimen Description URINE, RANDOM   Final   Special Requests NONE   Final   Culture  Setup Time     Final   Value: 10/30/2013 23:13     Performed at [facility]     Final   Value: >=100,000 COLONIES/ML     Performed at [facility]   Culture     Final   Value: [place]     Performed at [facility]   Report Status PENDING   Incomplete    Coagulation Studies: No results found for this basename: LABPROT, INR,  in the last 72 hours  Urinalysis:  Recent Labs  10/30/13 1703  COLORURINE YELLOW  LABSPEC 1.012  PHURINE 7.5  GLUCOSEU NEGATIVE  HGBUR LARGE*  BILIRUBINUR SMALL*  KETONESUR NEGATIVE  PROTEINUR 100*  UROBILINOGEN 0.2  NITRITE NEGATIVE  LEUKOCYTESUR LARGE*      Imaging: No results found.   Medications:   .  sodium bicarbonate  infusion 1000 mL 75 mL/hr at 11/02/13 0944   . cefTRIAXone (ROCEPHIN)  IV  1 g Intravenous Q24H  . citalopram  40 mg Oral Daily  . citric acid-sodium citrate  15 mL Oral TID PC & HS  . feeding supplement (NEPRO CARB STEADY)  237 mL Oral TID BM  . heparin  5,000 Units Subcutaneous 3 times per day  . pantoprazole  80 mg Oral Daily  . potassium chloride  20 mEq Oral BID        Assessment/ Plan:   Acute on CKD creatinine static  CT scan reviewed  Kidney Right scarred  Left  Hydro. Not sure that renal function will return to lower baseline . Initiated dialysis discussion. No urgent requirements. Discussed with Dr Moshe Cipro, who agrees with plan Anemia stable  Metabolic acidosis  Improved continue IV for another 24 hours and change to oral      LOS: 3 Adellyn Capek W @TODAY @2 :35 PM

## 2013-11-02 NOTE — Progress Notes (Signed)
[facility] PROGRESS NOTE  [name] [name] [id number] DOB: 09-Mar-1961 DOA: 10/30/2013  PCP: Florina Ou, MD  Brief HPI: [id number] with complicated urological history with bladder cancer s/p surgery, neobladder, ureteral stents for hydronephrosis at [facility] [place], [place], who presents with weakness and found to have AKI.  Past medical history:  Past Medical History  Diagnosis Date  . Gout     recent flair -bilateral feet--  STABLE  . Frequency of urination   . Urgency incontinence   . Nocturia   . Diabetes mellitus type 2, diet-controlled PT STOPPED TAKING METFORMIN--  HAS BEEN WATCHING DIET, EXERCISING AND LOSING WT  . Bladder cancer 2013  . Complication of anesthesia     agitation w/awakening in 9/13,OK 01/24/12  . Hypertension   . Anxiety   . Depression   . GERD (gastroesophageal reflux disease)     Consultants: Nephrology, Urology  Procedures: None  Antibiotics: Ceftriaxone 7/28-->  Subjective: Patient continues to improve. Denies any complaints. Ambulated this AM.   Objective: Vital Signs  Filed Vitals:   11/01/13 1001 11/01/13 1832 11/01/13 2105 11/02/13 0430  BP: 103/63 116/69 114/67 100/59  Pulse: 73 76 84 79  Temp: 98.1 F (36.7 C) 98 F (36.7 C) 98.6 F (37 C) 98.4 F (36.9 C)  TempSrc: Oral Oral Oral Oral  Resp: 19 18 18 18   Height:      Weight:   76.25 kg (168 lb 1.6 oz) 76 kg (167 lb 8.8 oz)  SpO2: 97% 100% 96% 98%    Intake/Output Summary (Last 24 hours) at 11/02/13 0943 Last data filed at 11/01/13 2040  Gross per 24 hour  Intake    360 ml  Output    675 ml  Net   -315 ml   Filed Weights   10/31/13 2046 11/01/13 2105 11/02/13 0430  Weight: 77.111 kg (170 lb) 76.25 kg (168 lb 1.6 oz) 76 kg (167 lb 8.8 oz)    General appearance: alert, cooperative, appears stated age, fatigued and no distress Back: SQ Nephrostomy tube noted. Now with drainage bag with clear liquid.  Resp: clear to auscultation  bilaterally Cardio: regular rate and rhythm, S1, S2 normal, no murmur, click, rub or gallop. No edema GI: soft, non-tender; bowel sounds normal; no masses,  no organomegaly Neurologic: Alert and oriented x 3. No focal deficits.  Lab Results:  Basic Metabolic Panel:  Recent Labs Lab 10/30/13 1615 10/30/13 1919 10/30/13 1931 10/31/13 0641 11/01/13 0650 11/02/13 0524  NA 135*  --   --  137 136* 137  K 4.5  --   --  4.3 3.7 3.1*  CL 110  --   --  114* 110 105  CO2 9*  --   --  9* 11* 17*  GLUCOSE 99  --   --  81 92 100*  BUN 68*  --   --  65* 64* 64*  CREATININE 3.13* 2.81*  --  3.13* 2.94* 3.02*  CALCIUM 11.6*  --  10.5 10.8* 10.1 9.1   Liver Function Tests:  Recent Labs Lab 10/30/13 1615 10/31/13 0641 11/01/13 0650 11/02/13 0524  AST 11 8 9 10   ALT 8 8 6 6   ALKPHOS 57 50 46 40  BILITOT <0.2* <0.2* <0.2* <0.2*  PROT 7.4 6.2 6.0 5.9*  ALBUMIN 3.5 2.9* 2.7* 2.5*   CBC:  Recent Labs Lab 10/30/13 1615 10/30/13 1919 10/31/13 0641 11/01/13 0650 11/02/13 0524  WBC 10.5 9.5 8.2 8.1 9.2  NEUTROABS  8.2*  --  5.6 5.7 6.5  HGB 11.4* 10.4* 10.3* 9.5* 9.0*  HCT 35.8* 33.6* 32.6* 30.0* 28.1*  MCV 84.4 85.1 86.2 83.8 83.1  PLT 395 346 346 322 294   Cardiac Enzymes:  Recent Labs Lab 10/30/13 1615  TROPONINI <0.30   BNP (last 3 results)  Recent Labs  10/30/13 1919  PROBNP 96.4   CBG:  Recent Labs Lab 10/30/13 1620  GLUCAP 115*     Studies/Results: No results found.  Medications:  Scheduled: . cefTRIAXone (ROCEPHIN)  IV  1 g Intravenous Q24H  . citalopram  40 mg Oral Daily  . citric acid-sodium citrate  15 mL Oral TID PC & HS  . feeding supplement (NEPRO CARB STEADY)  237 mL Oral TID BM  . heparin  5,000 Units Subcutaneous 3 times per day  . pantoprazole  80 mg Oral Daily  . potassium chloride  20 mEq Oral BID   Continuous: .  sodium bicarbonate  infusion 1000 mL 75 mL/hr at 11/01/13 0231   PRN:  Assessment/Plan:  Principal Problem:   AKI  (acute kidney injury) Active Problems:   Bladder cancer   UTI (lower urinary tract infection)   Weakness   Metabolic acidosis, increased anion gap   Protein-calorie malnutrition, severe    Acute Kidney Injury on CKD Stage 3 Patient's urological issues predominantly the etiology. However he seems to have developed CKD as well and now with acute injury. Creatinine stable. Discussed with Dr. Justin Mend. This could be his new baseline. Will continue IVF. Appreciate Nephrology input. Monitor urine output.  Metabolic Acidosis Likely due to renal failure. He had similar presentation earlier this year. Improving steadily. Started on [place] by Urology. Will leave on IV bicarb for now. Potassium being repleted.  UTI with Renal stranding Likely due to stent. Urine culture growing GPC. Final ID pending. Continue ceftriaxone for now.   History of Bladder cancer and Right Nephroureteral Stent Probably contributing to above. CT report reviewed. Appreciate urology input. Stent on continuous drainage. Stent to be addressed at [facility].  Hypercalcemia Was elevated due hypovolemia. Now better.   Normocytic Anemia Stable. Monitor.  Generalized Weakness/Fatigue Likely due to acute medical issues. No focal deficits. Improving. OOB and mobilize. PT/OT.   Code Status: Full Code  DVT Prophylaxis: Heparin    Family Communication: Discussed with patient Disposition Plan: Not ready for discharge yet.    LOS: 3 days   [facility] Pager [phone] 11/02/2013, 9:43 AM  If 8PM-8AM, please contact night-coverage at [email or web address], password [email or web address]   Disclaimer: This note was dictated with voice recognition software. Similar sounding words can inadvertently be transcribed and may not be corrected upon review.

## 2013-11-02 NOTE — Progress Notes (Signed)
Subjective: Patient reports that he is feeling better.  He is having a fair amount of urine out from his nephrostomy tube.  Irrigations are done without pain.  Objective: Vital signs in last 24 hours: Temp:  [98 F (36.7 C)-98.6 F (37 C)] 98.4 F (36.9 C) (07/31 0430) Pulse Rate:  [73-84] 79 (07/31 0430) Resp:  [18-19] 18 (07/31 0430) BP: (100-116)/(59-69) 100/59 mmHg (07/31 0430) SpO2:  [96 %-100 %] 98 % (07/31 0430) Weight:  [76 kg (167 lb 8.8 oz)-76.25 kg (168 lb 1.6 oz)] 76 kg (167 lb 8.8 oz) (07/31 0430)  Intake/Output from previous day: 07/30 0701 - 07/31 0700 In: 360 [P.O.:360] Out: 675 [Urine:675] Intake/Output this shift:    Physical Exam:  Constitutional: Vital signs reviewed. WD WN in NAD   Eyes: PERRL, No scleral icterus.   Pulmonary/Chest: Normal effort    Lab Results:  Recent Labs  10/31/13 0641 11/01/13 0650 11/02/13 0524  HGB 10.3* 9.5* 9.0*  HCT 32.6* 30.0* 28.1*   BMET  Recent Labs  11/01/13 0650 11/02/13 0524  NA 136* 137  K 3.7 3.1*  CL 110 105  CO2 11* 17*  GLUCOSE 92 100*  BUN 64* 64*  CREATININE 2.94* 3.02*  CALCIUM 10.1 9.1   No results found for this basename: LABPT, INR,  in the last 72 hours No results found for this basename: LABURIN,  in the last 72 hours Results for orders placed during the hospital encounter of 10/30/13  URINE CULTURE     Status: None   Collection Time    10/30/13  5:03 PM      Result Value Ref Range Status   Specimen Description URINE, RANDOM   Final   Special Requests ADD [id number]   Final   Culture  Setup Time     Final   Value: 10/30/2013 20:05     Performed at [facility]     Final   Value: >=100,000 COLONIES/ML     Performed at [facility]   Culture     Final   Value: Horatio     Performed at [facility]   Report Status PENDING   Incomplete  CULTURE, BLOOD (ROUTINE X 2)     Status: None   Collection Time    10/30/13  7:19 PM   Result Value Ref Range Status   Specimen Description BLOOD ARM RIGHT   Final   Special Requests BOTTLES DRAWN AEROBIC AND ANAEROBIC 5CCBLUE 3CCRED   Final   Culture  Setup Time     Final   Value: 10/31/2013 00:21     Performed at [facility]   Culture     Final   Value:        BLOOD CULTURE RECEIVED NO GROWTH TO DATE CULTURE WILL BE HELD FOR 5 DAYS BEFORE ISSUING A FINAL NEGATIVE REPORT     Performed at [facility]   Report Status PENDING   Incomplete  CULTURE, BLOOD (ROUTINE X 2)     Status: None   Collection Time    10/30/13  7:31 PM      Result Value Ref Range Status   Specimen Description BLOOD RIGHT WRIST   Final   Special Requests BOTTLES DRAWN AEROBIC AND ANAEROBIC [facility]   Final   Culture  Setup Time     Final   Value: 10/31/2013 00:21     Performed at [facility]  Final   Value:        BLOOD CULTURE RECEIVED NO GROWTH TO DATE CULTURE WILL BE HELD FOR 5 DAYS BEFORE ISSUING A FINAL NEGATIVE REPORT     Performed at [facility]   Report Status PENDING   Incomplete  URINE CULTURE     Status: None   Collection Time    10/30/13  9:34 PM      Result Value Ref Range Status   Specimen Description URINE, RANDOM   Final   Special Requests NONE   Final   Culture  Setup Time     Final   Value: 10/30/2013 23:13     Performed at [facility]     Final   Value: >=100,000 COLONIES/ML     Performed at [facility]   Culture     Final   Value: [place]     Performed at [facility]   Report Status PENDING   Incomplete    Studies/Results: No results found.  Assessment/Plan:   1.  Acute renal insufficiency, multifactorial.  This is combined with an acidosis that is being replacedwith bicarbonate/corrected.  The acidosis is related to absorption of urinary acid from his neobladder. this is being currently corrected with sodium bicarbonate in his IV, but he will need long-term  alkalinization orally-I have started him on Shohls solution.  He will need to go home on this, at least 1/2 tablespoon with meals.  He'll likely need this long-term.  He will need to have his basic metabolic panel checked again in about a week after discharge.  2.  History of muscle invasive bladder cancer, status post cystectomy and neobladder.  No evidence of recurrence yet.  3.  Right distal ureteral stricture, treated with nephroureteral catheter.  More than likely, this catheter is at least partially obstructed, currently he is draining well through the nephrostomy tube which has been opened up.  It looks like his acidosis is being adequately corrected.  I would leave his nephrostomy tube to drain, this can be managed by his interventional radiologist/urologist in [place].  I would recommend sending him home on the [facility] solution.   LOS: 3 days   Franchot Gallo M 11/02/2013, 7:49 AM

## 2013-11-02 NOTE — Evaluation (Signed)
Physical Therapy Evaluation Patient Details Name: [name] [name] MRN: [id number] DOB: December 11, 1960 Today's Date: 11/02/2013   History of Present Illness  Pt. admitted 10/30/13 with weakness, found to have acute renal failure and metabolic acidosis.   Pt. with complicated bladder cancer history.  Currently has nehhroureteral catheter R side.    Clinical Impression  Pt. Presents to PT at a modified independent level of functional mobility and gait.  No overt loss of balance noted with walking in the hallway. He states he felt very weak on the day of admission but has since felt back to his baseline in terms of his mobility.  I do not see any skilled PT needs, therefore will sign off.  Please call if questions.        Follow Up Recommendations No PT follow up    Equipment Recommendations       Recommendations for Other Services       Precautions / Restrictions Precautions Precautions: Fall Precaution Comments: Pt. reports 2 falls on Monday 7/27 while working around the house.   Restrictions Weight Bearing Restrictions: No      Mobility  Bed Mobility Overal bed mobility: Independent             General bed mobility comments: no difficulty with bed mobility  Transfers Overall transfer level: Independent Equipment used: None             General transfer comment: pt. managing well with no assist needed  Ambulation/Gait Ambulation/Gait assistance: Modified independent (Device/Increase time) Ambulation Distance (Feet): 200 Feet Assistive device: None Gait Pattern/deviations: WFL(Within Functional Limits) Gait velocity: slightly decreased   General Gait Details: Pt. safely ambulated in hallway without assistive device and able to manage his own IV pole as well.  did not need support from IV pole.  No overt LOB noted.  Able to negotiate safely around objects in the hallway.    Stairs            Wheelchair Mobility    Modified Rankin (Stroke Patients Only)        Balance Overall balance assessment: No apparent balance deficits (not formally assessed)                                           Pertinent Vitals/Pain See vitals tab No pain, no distress    Home Living Family/patient expects to be discharged to:: Private residence Living Arrangements: Spouse/significant other;Children Available Help at Discharge: Family Type of Home: House Home Access: Stairs to enter Entrance Stairs-Rails: None Technical brewer of Steps: 3 Home Layout: One level Home Equipment: None      Prior Function Level of Independence: Independent         Comments: reports [employer] took his medical card last week so 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 drive an 18 wheeler     Hand Dominance        Extremity/Trunk Assessment   Upper Extremity Assessment: Overall WFL for tasks assessed           Lower Extremity Assessment: Overall WFL for tasks assessed      Cervical / Trunk Assessment: Normal  Communication   Communication: No difficulties  Cognition Arousal/Alertness: Awake/alert Behavior During Therapy: WFL for tasks assessed/performed Overall Cognitive Status: Within Functional Limits for tasks assessed  General Comments      Exercises        Assessment/Plan    PT Assessment Patent does not need any further PT services  PT Diagnosis     PT Problem List    PT Treatment Interventions     PT Goals (Current goals can be found in the Care Plan section) Acute Rehab PT Goals PT Goal Formulation: No goals set, d/c therapy    Frequency     Barriers to discharge        Co-evaluation               End of Session   Activity Tolerance: Patient tolerated treatment well Patient left: in chair;with call bell/phone within reach Nurse Communication: Mobility status         Time: [id number] PT Time Calculation (min): 18 min   Charges:   PT Evaluation $Initial PT Evaluation Tier I: 1  Procedure PT Treatments $Gait Training: 8-22 mins   PT G CodesLadona Ridgel 11/02/2013, 8:33 AM Gerlean Ren PT Acute Rehab Services [phone] [place] [phone]

## 2013-11-02 NOTE — Progress Notes (Signed)
OT Cancellation Note  Patient Details Name: [name] [name] MRN: [id number] DOB: [birth year]   Cancelled Treatment:    Reason Eval/Treat Not Completed: Other (comment).  Pt was mod I with PT and feels near baseline.  He does not have any OT needs.  Pt does have a tub/shower and has had to take rest breaks before, and will make sure he feels strong enough before getting into it.  Ryleigh Esqueda 11/02/2013, 10:13 AM Lesle Chris, OTR/L [phone] 11/02/2013

## 2013-11-03 LAB — CBC
HCT: 28.3 % — ABNORMAL LOW (ref 39.0–52.0)
Hemoglobin: 9.2 g/dL — ABNORMAL LOW (ref 13.0–17.0)
MCH: 26.8 pg (ref 26.0–34.0)
MCHC: 32.5 g/dL (ref 30.0–36.0)
MCV: 82.5 fL (ref 78.0–100.0)
Platelets: 324 10*3/uL (ref 150–400)
RBC: 3.43 MIL/uL — ABNORMAL LOW (ref 4.22–5.81)
RDW: 18 % — ABNORMAL HIGH (ref 11.5–15.5)
WBC: 10.6 10*3/uL — AB (ref 4.0–10.5)

## 2013-11-03 LAB — RENAL FUNCTION PANEL
Albumin: 2.7 g/dL — ABNORMAL LOW (ref 3.5–5.2)
Anion gap: 8 (ref 5–15)
BUN: 56 mg/dL — ABNORMAL HIGH (ref 6–23)
CALCIUM: 8.8 mg/dL (ref 8.4–10.5)
CO2: 25 meq/L (ref 19–32)
Chloride: 103 mEq/L (ref 96–112)
Creatinine, Ser: 2.42 mg/dL — ABNORMAL HIGH (ref 0.50–1.35)
GFR calc Af Amer: 33 mL/min — ABNORMAL LOW (ref >90)
GFR calc non Af Amer: 28 mL/min — ABNORMAL LOW (ref >90)
Glucose, Bld: 116 mg/dL — ABNORMAL HIGH (ref 70–99)
Phosphorus: 1.7 mg/dL — ABNORMAL LOW (ref 2.3–4.6)
Potassium: 3.1 mEq/L — ABNORMAL LOW (ref 3.7–5.3)
SODIUM: 136 meq/L — AB (ref 137–147)

## 2013-11-03 MED ORDER — AMOXICILLIN 500 MG PO CAPS
500.0000 mg | ORAL_CAPSULE | Freq: Two times a day (BID) | ORAL | Status: DC
  Filled 2013-11-03 (×2): qty 1

## 2013-11-03 MED ORDER — POTASSIUM CHLORIDE CRYS ER 20 MEQ PO TBCR: 40.0000 meq | EXTENDED_RELEASE_TABLET | Freq: Once | ORAL | Status: DC

## 2013-11-03 MED ORDER — UNABLE TO FIND: Status: DC

## 2013-11-03 MED ORDER — CITRIC ACID-SODIUM CITRATE 334-500 MG/5ML PO SOLN: 15.0000 mL | Freq: Three times a day (TID) | ORAL | Status: DC

## 2013-11-03 MED ORDER — AMOXICILLIN 500 MG PO CAPS: 500.0000 mg | ORAL_CAPSULE | Freq: Two times a day (BID) | ORAL | Status: DC

## 2013-11-03 NOTE — Discharge Instructions (Signed)
Food Basics for Chronic Kidney Disease  When your kidneys are not working well, they cannot remove waste and excess substances from your blood as effectively as they did before. This can lead to a buildup and imbalance of these substances, which can affect how your body functions. This buildup can also make your kidneys work harder, causing even more damage.  You may need to eat less of certain foods that can lead to the buildup of these substances in your body. By making the changes to your diet that are recommended by your dietitian or health care provider, you could possibly help prevent further kidney damage and delay or prevent the need for dialysis.  The following information can help give you a basic understanding of these substances and how they affect your bodily functions. The information also gives examples of foods that contain the highest amounts of these substances.  WHAT DO I NEED TO KNOW ABOUT SUBSTANCES IN MY FOOD THAT I MAY NEED TO ADJUST?  Food adjustments will be different for each person with chronic kidney disease. It is important that you see a dietitian who can help you determine the specific adjustments that you will need to make for each of the following substances:  Potassium  Potassium affects how steadily your heart beats. If too much potassium builds up in your blood, it can cause an irregular heartbeat or even a heart attack.  Examples of foods rich in potassium include:  · Milk.  · Fruits.  · Vegetables.  Phosphorus  Phosphorus is a mineral found in your bones. A balance between calcium and phosphorous is needed to build and maintain healthy bones. Too much phosphorus pulls calcium from your bones. This can make your bones weak and more likely to break. Too much phosphorus can also make your skin itch.  Examples of foods rich in phosphorus include:  · Milk and cheese.  · Dried beans.  · Peas.  · Colas.  · Nuts and peanut butter.  Animal Protein  Animal protein helps you make and keep  muscle. It also helps in the repair of your body's cells and tissues. One of the natural breakdown products of protein is a waste product called urea. When your kidneys are not working properly, they cannot remove wastes such as urea like they did before you developed chronic kidney disease. You will likely need to limit the amount of protein you eat to help prevent a buildup of urea in your blood. Examples of animal protein include:  · Meat (all types).  · Fish and seafood.  · Poultry.  · Eggs.  Sodium  Sodium, which is found in salt, helps maintain a healthy balance of fluids in your body. Too much sodium can increase your blood pressure level and have a negative affect on the function of your heart and lungs. Too much sodium also can cause your body to retain too much fluid, making your kidneys work harder. Examples of foods with high levels of sodium include:  · Salt seasonings.  · Soy sauce.  · Cured and processed meats.  · Salted crackers and snack foods.  · Fast food.  · Canned soups and most canned foods.  Glucose  Glucose provides energy for your body. If you have diabetes mellitus that is not properly controlled, you have too much glucose in your blood. Too much glucose in your blood can worsen the function of your kidneys by damaging small blood vessels. This prevents enough blood flow to your kidneys to   TAKE A VITAMIN AND MINERAL SUPPLEMENT? Because you may need to avoid eating certain foods, you may not get all of the vitamins and minerals that would normally come from those foods. Your health care provider or dietitian may recommend that you take a supplement to ensure that you get all of the vitamins and minerals that your body needs.  Document Released: 06/12/2002 Document Revised: 08/06/2013  Document Reviewed: 02/16/2013 [facility] Patient Information 2015 [place], [place]. This information is not intended to replace advice given to you by your health care provider. Make sure you discuss any questions you have with your health care provider.

## 2013-11-03 NOTE — Discharge Summary (Signed)
[facility]  Physician Discharge Summary   Patient ID: [name] [name] MRN: [id number] DOB/AGE: 55-Dec-1962 55 y.o.  Admit date: 10/30/2013 Discharge date: 11/03/2013  PCP: Florina Ou, MD  DISCHARGE DIAGNOSES:  Principal Problem:   AKI (acute kidney injury) Active Problems:   Bladder cancer   UTI (lower urinary tract infection)   Weakness   Metabolic acidosis, increased anion gap   Protein-calorie malnutrition, severe   RECOMMENDATIONS FOR OUTPATIENT FOLLOW UP: 1. Patient has an upcoming appointment at [facility] 2. BMET on 8/5 to f/u on renal function, bicarb and Potassium 3. Urine Culture reports requires follow up. See below.   DISCHARGE CONDITION: fair  Diet recommendation: renal  Filed Weights   10/31/13 2046 11/01/13 2105 11/02/13 0430  Weight: 77.111 kg (170 lb) 76.25 kg (168 lb 1.6 oz) 76 kg (167 lb 8.8 oz)    INITIAL HISTORY: [id number] with complicated urological history with bladder cancer s/p surgery, neobladder, ureteral stents for hydronephrosis at [facility] at [place], [place], who presented with weakness and found to have AKI and metabolic acidosis.  Consultations:  Nephrology  Urology  Procedures:  None  HOSPITAL COURSE:   Acute Kidney Injury on CKD Stage 3  Patient's urological issues are primarily responsible. He also seems to have developed CKD as well and now with acute injury. Patient was started on Iv fluids. He was seen by Nephrology. Creatinine remained high but then has improved today. He will follow up with his nephrologist. BMET next week. He has good urine output. He will be given potassium prior to discharge.  Metabolic Acidosis  This was likely due to renal failure. He had similar presentation earlier this year. He was started on bicarbonate infusion. He was also given Oracit. His bicarb has improved. He will be discharged on Oracit. Repeat blood work next week.   UTI with Renal stranding  His percut  Nephrostomy tube/stent was placed 2 weeks ago at [facility]. This is likely reason for UTI/Pyelo. Urine culture growing 100K GPC. Final identification may not be available till late tomorrow. Patient has appointment at [facility] which is important to keep. Discussed with ID. We will discharge him on Amoxicillin. Will follow up on urine culture. This was explained to Patient and family. He was initially kept on Ceftriaxone. He remains afebrile with normal WBC.  History of Bladder cancer and Right Nephroureteral Stent  Probably contributing to above. CT report as below. He was seen daily by Urology. Initially the drain was capped. But then it was attached to a bag. His symptoms improved subsequently. Stent to be addressed at [facility]. Wife to flush as before.  Hypercalcemia  Was elevated due hypovolemia. Now better after hydration.   Normocytic Anemia  Stable.  Generalized Weakness/Fatigue  This was likely due to acute medical issues. No focal deficits. He has improved and has been ambulating. PT/OT.  Patient is improved. He is stable for discharge.   PERTINENT LABS:  The results of significant diagnostics from this hospitalization (including imaging, microbiology, ancillary and laboratory) are listed below for reference.    Microbiology: Recent Results (from the past 240 hour(s))  URINE CULTURE     Status: None   Collection Time    10/30/13  5:03 PM      Result Value Ref Range Status   Specimen Description URINE, RANDOM   Final   Special Requests ADD [id number]   Final   Culture  Setup Time     Final   Value: 10/30/2013 20:05  Performed at [facility]     Final   Value: >=100,000 COLONIES/ML     Performed at [facility]   Culture     Final   Value: [place]     Performed at [facility]   Report Status PENDING   Incomplete  CULTURE, BLOOD (ROUTINE X 2)     Status: None   Collection Time    10/30/13  7:19 PM      Result Value  Ref Range Status   Specimen Description BLOOD ARM RIGHT   Final   Special Requests BOTTLES DRAWN AEROBIC AND ANAEROBIC 5CCBLUE 3CCRED   Final   Culture  Setup Time     Final   Value: 10/31/2013 00:21     Performed at [facility]   Culture     Final   Value:        BLOOD CULTURE RECEIVED NO GROWTH TO DATE CULTURE WILL BE HELD FOR 5 DAYS BEFORE ISSUING A FINAL NEGATIVE REPORT     Performed at [facility]   Report Status PENDING   Incomplete  CULTURE, BLOOD (ROUTINE X 2)     Status: None   Collection Time    10/30/13  7:31 PM      Result Value Ref Range Status   Specimen Description BLOOD RIGHT WRIST   Final   Special Requests BOTTLES DRAWN AEROBIC AND ANAEROBIC 6CC   Final   Culture  Setup Time     Final   Value: 10/31/2013 00:21     Performed at [facility]   Culture     Final   Value:        BLOOD CULTURE RECEIVED NO GROWTH TO DATE CULTURE WILL BE HELD FOR 5 DAYS BEFORE ISSUING A FINAL NEGATIVE REPORT     Performed at [facility]   Report Status PENDING   Incomplete  URINE CULTURE     Status: None   Collection Time    10/30/13  9:34 PM      Result Value Ref Range Status   Specimen Description URINE, RANDOM   Final   Special Requests NONE   Final   Culture  Setup Time     Final   Value: 10/30/2013 23:13     Performed at [facility]     Final   Value: >=100,000 COLONIES/ML     Performed at [facility]   Culture     Final   Value: [place]     Performed at [facility]   Report Status PENDING   Incomplete     Labs: Basic Metabolic Panel:  Recent Labs Lab 10/30/13 1615 10/30/13 1919 10/30/13 1931 10/31/13 0641 11/01/13 0650 11/02/13 0524 11/03/13 0258  NA 135*  --   --  137 136* 137 136*  K 4.5  --   --  4.3 3.7 3.1* 3.1*  CL 110  --   --  114* 110 105 103  CO2 9*  --   --  9* 11* 17* 25  GLUCOSE 99  --   --  81 92 100* 116*  BUN 68*  --   --  65* 64* 64* 56*  CREATININE  3.13* 2.81*  --  3.13* 2.94* 3.02* 2.42*  CALCIUM 11.6*  --  10.5 10.8* 10.1 9.1 8.8  PHOS  --   --   --   --   --   --  1.7*   Liver Function Tests:  Recent Labs Lab 10/30/13 1615 10/31/13 0641 11/01/13 0650 11/02/13 0524 11/03/13 0258  AST 11 8 9 10   --   ALT 8 8 6 6   --   ALKPHOS 57 50 46 40  --   BILITOT <0.2* <0.2* <0.2* <0.2*  --   PROT 7.4 6.2 6.0 5.9*  --   ALBUMIN 3.5 2.9* 2.7* 2.5* 2.7*   No results found for this basename: LIPASE, AMYLASE,  in the last 168 hours No results found for this basename: AMMONIA,  in the last 168 hours CBC:  Recent Labs Lab 10/30/13 1615 10/30/13 1919 10/31/13 0641 11/01/13 0650 11/02/13 0524 11/03/13 0258  WBC 10.5 9.5 8.2 8.1 9.2 10.6*  NEUTROABS 8.2*  --  5.6 5.7 6.5  --   HGB 11.4* 10.4* 10.3* 9.5* 9.0* 9.2*  HCT 35.8* 33.6* 32.6* 30.0* 28.1* 28.3*  MCV 84.4 85.1 86.2 83.8 83.1 82.5  PLT 395 346 346 322 294 324   Cardiac Enzymes:  Recent Labs Lab 10/30/13 1615  TROPONINI <0.30   BNP: BNP (last 3 results)  Recent Labs  10/30/13 1919  PROBNP 96.4   CBG:  Recent Labs Lab 10/30/13 1620  GLUCAP 115*     IMAGING STUDIES Ct Abdomen Pelvis Wo Contrast  10/30/2013   CLINICAL DATA:  Patient status post recent placement of right percutaneous nephroureteral stent. Lower abdominal pain.  EXAM: CT ABDOMEN AND PELVIS WITHOUT CONTRAST  TECHNIQUE: Multidetector CT imaging of the abdomen and pelvis was performed following the standard protocol without IV contrast.  COMPARISON:  CT abdomen pelvis 05/21/2013.  FINDINGS: Visualization of the lower thorax demonstrates minimal dependent atelectasis. Normal heart size.  Lack of intravenous contrast material limits evaluation of the solid organ parenchyma.  Noncontrast enhanced liver is unremarkable. The gallbladder is contracted. The spleen, pancreas bilateral adrenal glands are unremarkable.  Re- demonstrated right-sided nephrolithiasis. Interval removal of left nephroureteral stent  with mild left hydroureteronephrosis. Interval placement of right percutaneous nephroureteral stent coiled in the right renal collecting system and distally within the neobladder. Extensive fat stranding about the right renal collecting system.  Neobladder visualized within the pelvis, slightly more distended than on prior examination. Small amount of gas anteriorly within the neobladder.  Normal caliber abdominal aorta. No retroperitoneal lymphadenopathy. Extensive pelvic surgical clips. Patient status post cystectomy.  Stool throughout the colon. Normal appendix. No evidence for bowel obstruction. No free fluid or free intraperitoneal air.  Lower lumbar spine degenerative change with bilateral L4 pars defects and grade 1 anterolisthesis of L4 on L5. Re- demonstrated T11 vertebral compression fracture.  IMPRESSION: 1. Patient status post removal of left percutaneous nephroureteral stent. There is persistent mild left hydroureteronephrosis to the level of the neobladder. 2. Interval insertion of right percutaneous nephroureteral stent with the distal aspect coiled in the neobladder. There is extensive stranding about the right renal collecting system and right renal hilum which is nonspecific and may be postprocedural in etiology. An infectious process could appear similar. There is persistent moderate right hydronephrosis. 3. Gas within neobladder, potentially post procedural in etiology. Infection not excluded. Interval removal of suprapubic catheter. Neobladder appears more distended than on prior examination.   Electronically Signed   By: Lovey Newcomer M.D.   On: 10/30/2013 20:36   X-ray Chest Pa And Lateral   10/30/2013   CLINICAL DATA:  Weakness.  EXAM: CHEST  2 VIEW  COMPARISON:  Chest x-rays dated 06/25/2013 and 05/24/2013  FINDINGS: Heart size and pulmonary vascularity  are normal and the lungs are clear. No acute osseous abnormality. Old compression fracture in the lower thoracic spine, stable.   IMPRESSION: No acute abnormality.   Electronically Signed   By: Rozetta Nunnery M.D.   On: 10/30/2013 20:28   Ct Head Wo Contrast  10/30/2013   CLINICAL DATA:  Weakness. The patient has fallen twice and struck his head. No loss of consciousness.  EXAM: CT HEAD WITHOUT CONTRAST  TECHNIQUE: Contiguous axial images were obtained from the base of the skull through the vertex without intravenous contrast.  COMPARISON:  CT scan dated 10/28/2005  FINDINGS: No mass lesion. No midline shift. No acute hemorrhage or hematoma. No extra-axial fluid collections. No evidence of acute infarction. Brain parenchyma is normal. Osseous structures are normal.  IMPRESSION: Normal exam.   Electronically Signed   By: Rozetta Nunnery M.D.   On: 10/30/2013 20:17    DISCHARGE EXAMINATION: Filed Vitals:   11/02/13 0430 11/02/13 2144 11/03/13 0642 11/03/13 0907  BP: 100/59 129/70 100/55 119/61  Pulse: 79 83 74 75  Temp: 98.4 F (36.9 C) 98.6 F (37 C) 98.4 F (36.9 C) 97.9 F (36.6 C)  TempSrc: Oral Oral  Oral  Resp: 18 20 18 20   Height:      Weight: 76 kg (167 lb 8.8 oz)     SpO2: 98% 99% 98% 100%   General appearance: alert, cooperative, appears stated age and no distress Resp: clear to auscultation bilaterally Cardio: regular rate and rhythm, S1, S2 normal, no murmur, click, rub or gallop GI: soft, non-tender; bowel sounds normal; no masses,  no organomegaly. Sq nephrostomy with drainage bag noted. Extremities: extremities normal, atraumatic, no cyanosis or edema Neurologic: No focal deficits  DISPOSITION: Home  Discharge Instructions   Call MD for:  persistant nausea and vomiting    Complete by:  As directed      Call MD for:  redness, tenderness, or signs of infection (pain, swelling, redness, odor or green/yellow discharge around incision site)    Complete by:  As directed      Call MD for:  severe uncontrolled pain    Complete by:  As directed      Call MD for:  temperature >100.4    Complete by:  As  directed      Discharge diet:    Complete by:  As directed   Renal diet     Discharge instructions    Complete by:  As directed   You will need to have blood work next week: BMET to check your renal function, potassium and bicarbonate levels. Please keep your appointment with your providers in [place]. Please follow up with Dr. Moshe Cipro as well. Keep the nephrostomy tube on continuous drainage as now. Flush as before.     Increase activity slowly    Complete by:  As directed            ALLERGIES:  Allergies  Allergen Reactions  . Lisinopril Cough    Current Discharge Medication List    START taking these medications   Details  amoxicillin (AMOXIL) 500 MG capsule Take 1 capsule (500 mg total) by mouth every 12 (twelve) hours. 10 days Qty: 20 capsule, Refills: 0    citric acid-sodium citrate (ORACIT) 334-500 MG/5ML solution Take 15 mLs by mouth 4 (four) times daily - after meals and at bedtime. Qty: 473 mL, Refills: 1      CONTINUE these medications which have NOT CHANGED   Details  citalopram (CELEXA) 40  MG tablet Take 40 mg by mouth daily.    omeprazole (PRILOSEC) 40 MG capsule Take 1 capsule (40 mg total) by mouth 2 (two) times daily before a meal. Qty: 60 capsule, Refills: 1       Follow-up Information   Call Florina Ou, MD. (As needed)    Specialty:  Family Medicine   Contact information:   [place] [address] [phone]       Follow up with GOLDSBOROUGH,KELLIE A, MD. Schedule an appointment as soon as possible for a visit in 1 week.   Specialty:  Nephrology   Contact information:   [place] [address] [phone]       TOTAL DISCHARGE TIME: 33 mins  [facility] Pager [phone]  11/03/2013, 11:23 AM  Disclaimer: This note was dictated with voice recognition software. Similar sounding words can inadvertently be transcribed and may not be corrected upon  review.

## 2013-11-03 NOTE — Progress Notes (Signed)
Patient's IV removed.  Discharge paperwork given and explained to patient.  Patient will go home by car with family.

## 2013-11-03 NOTE — Progress Notes (Signed)
Subjective: Patient reports that he is feeling much stronger. No specific pain. He has had no problems with his nephrostomy tube.  Objective: Vital signs in last 24 hours: Temp:  [98.4 F (36.9 C)-98.6 F (37 C)] 98.4 F (36.9 C) (08/01 0642) Pulse Rate:  [74-83] 74 (08/01 0642) Resp:  [18-20] 18 (08/01 0642) BP: (100-129)/(55-70) 100/55 mmHg (08/01 0642) SpO2:  [98 %-99 %] 98 % (08/01 0642)  Intake/Output from previous day: 07/31 0701 - 08/01 0700 In: 1910 [P.O.:960; I.V.:900; IV Piggyback:50] Out: 425 [Urine:425] Intake/Output this shift:    Physical Exam:  Constitutional: Vital signs reviewed. WD WN in NAD   Eyes: PERRL, No scleral icterus.   Pulmonary/Chest: Normal effort   Lab Results:  Recent Labs  11/01/13 0650 11/02/13 0524 11/03/13 0258  HGB 9.5* 9.0* 9.2*  HCT 30.0* 28.1* 28.3*   BMET  Recent Labs  11/02/13 0524 11/03/13 0258  NA 137 136*  K 3.1* 3.1*  CL 105 103  CO2 17* 25  GLUCOSE 100* 116*  BUN 64* 56*  CREATININE 3.02* 2.42*  CALCIUM 9.1 8.8   No results found for this basename: LABPT, INR,  in the last 72 hours No results found for this basename: LABURIN,  in the last 72 hours Results for orders placed during the hospital encounter of 10/30/13  URINE CULTURE     Status: None   Collection Time    10/30/13  5:03 PM      Result Value Ref Range Status   Specimen Description URINE, RANDOM   Final   Special Requests ADD [id number]   Final   Culture  Setup Time     Final   Value: 10/30/2013 20:05     Performed at [facility]     Final   Value: >=100,000 COLONIES/ML     Performed at [facility]   Culture     Final   Value: [place]     Performed at [facility]   Report Status PENDING   Incomplete  CULTURE, BLOOD (ROUTINE X 2)     Status: None   Collection Time    10/30/13  7:19 PM      Result Value Ref Range Status   Specimen Description BLOOD ARM RIGHT   Final   Special  Requests BOTTLES DRAWN AEROBIC AND ANAEROBIC 5CCBLUE 3CCRED   Final   Culture  Setup Time     Final   Value: 10/31/2013 00:21     Performed at [facility]   Culture     Final   Value:        BLOOD CULTURE RECEIVED NO GROWTH TO DATE CULTURE WILL BE HELD FOR 5 DAYS BEFORE ISSUING A FINAL NEGATIVE REPORT     Performed at [facility]   Report Status PENDING   Incomplete  CULTURE, BLOOD (ROUTINE X 2)     Status: None   Collection Time    10/30/13  7:31 PM      Result Value Ref Range Status   Specimen Description BLOOD RIGHT WRIST   Final   Special Requests BOTTLES DRAWN AEROBIC AND ANAEROBIC [facility]   Final   Culture  Setup Time     Final   Value: 10/31/2013 00:21     Performed at [facility]   Culture     Final   Value:        BLOOD CULTURE RECEIVED NO GROWTH TO DATE CULTURE WILL  BE HELD FOR 5 DAYS BEFORE ISSUING A FINAL NEGATIVE REPORT     Performed at [facility]   Report Status PENDING   Incomplete  URINE CULTURE     Status: None   Collection Time    10/30/13  9:34 PM      Result Value Ref Range Status   Specimen Description URINE, RANDOM   Final   Special Requests NONE   Final   Culture  Setup Time     Final   Value: 10/30/2013 23:13     Performed at [facility]     Final   Value: >=100,000 COLONIES/ML     Performed at [facility]   Culture     Final   Value: [place]     Performed at [facility]   Report Status PENDING   Incomplete    Studies/Results: No results found.  Assessment/Plan:   Right hydronephrosis secondary ureteral stricture, treated adequately with a nephroureteral catheter, which is drained externally at the present time    Acute renal insufficiency with acidosis, improving slightly. Acidosis adequately managed with oral alkalinizing agent    I would recommend letting him go home with his catheter drainage. He has a followup appointment in [place], and I would  recommend that he have a followup basic metabolic panel in the next week to check that he is getting appropriate alkalinization with the Shohl's solution. I will send him home with at least 3 times a day Shohl solution, 15 mL.   LOS: 4 days   Franchot Gallo M 11/03/2013, 8:30 AM

## 2013-11-03 NOTE — Progress Notes (Signed)
Offered Pt. A bath, he stated he may go home today and will take bath once he is home. Tech asked Pt. To let her know if he changed his mind.

## 2013-11-03 NOTE — Progress Notes (Signed)
[name] [name] ROUNDING NOTE   Subjective:   Interval History: feels much better today --  very appreciative of care received   Objective:  Vital signs in last 24 hours:  Temp:  [97.9 F (36.6 C)-98.6 F (37 C)] 97.9 F (36.6 C) (08/01 0907) Pulse Rate:  [74-83] 75 (08/01 0907) Resp:  [18-20] 20 (08/01 0907) BP: (100-129)/(55-70) 119/61 mmHg (08/01 0907) SpO2:  [98 %-100 %] 100 % (08/01 0907)  Weight change:  Filed Weights   10/31/13 2046 11/01/13 2105 11/02/13 0430  Weight: 77.111 kg (170 lb) 76.25 kg (168 lb 1.6 oz) 76 kg (167 lb 8.8 oz)    Intake/Output: I/O last 3 completed shifts: In: 2870 [P.O.:1320; I.V.:1500; IV Piggyback:50] Out: 425 [Urine:425]   Intake/Output this shift:  Total I/O In: 600 [P.O.:600] Out: -   CVS- RRR RS- CTA ABD- BS present soft non-distended EXT- no edema   Basic Metabolic Panel:  Recent Labs Lab 10/30/13 1615 10/30/13 1919  10/31/13 0641 11/01/13 0650 11/02/13 0524 11/03/13 0258  NA 135*  --   --  137 136* 137 136*  K 4.5  --   --  4.3 3.7 3.1* 3.1*  CL 110  --   --  114* 110 105 103  CO2 9*  --   --  9* 11* 17* 25  GLUCOSE 99  --   --  81 92 100* 116*  BUN 68*  --   --  65* 64* 64* 56*  CREATININE 3.13* 2.81*  --  3.13* 2.94* 3.02* 2.42*  CALCIUM 11.6*  --   < > 10.8* 10.1 9.1 8.8  PHOS  --   --   --   --   --   --  1.7*  < > = values in this interval not displayed.  Liver Function Tests:  Recent Labs Lab 10/30/13 1615 10/31/13 0641 11/01/13 0650 11/02/13 0524 11/03/13 0258  AST 11 8 9 10   --   ALT 8 8 6 6   --   ALKPHOS 57 50 46 40  --   BILITOT <0.2* <0.2* <0.2* <0.2*  --   PROT 7.4 6.2 6.0 5.9*  --   ALBUMIN 3.5 2.9* 2.7* 2.5* 2.7*   No results found for this basename: LIPASE, AMYLASE,  in the last 168 hours No results found for this basename: AMMONIA,  in the last 168 hours  CBC:  Recent Labs Lab 10/30/13 1615 10/30/13 1919 10/31/13 0641 11/01/13 0650 11/02/13 0524 11/03/13 0258   WBC 10.5 9.5 8.2 8.1 9.2 10.6*  NEUTROABS 8.2*  --  5.6 5.7 6.5  --   HGB 11.4* 10.4* 10.3* 9.5* 9.0* 9.2*  HCT 35.8* 33.6* 32.6* 30.0* 28.1* 28.3*  MCV 84.4 85.1 86.2 83.8 83.1 82.5  PLT 395 346 346 322 294 324    Cardiac Enzymes:  Recent Labs Lab 10/30/13 1615  TROPONINI <0.30    BNP: No components found with this basename: POCBNP,   CBG:  Recent Labs Lab 10/30/13 1620  GLUCAP 115*    Microbiology: Results for orders placed during the hospital encounter of 10/30/13  URINE CULTURE     Status: None   Collection Time    10/30/13  5:03 PM      Result Value Ref Range Status   Specimen Description URINE, RANDOM   Final   Special Requests ADD [id number]   Final   Culture  Setup Time     Final   Value: 10/30/2013 20:05     Performed  at [facility]     Final   Value: >=100,000 COLONIES/ML     Performed at [facility]   Culture     Final   Value: [place]     Performed at [facility]   Report Status PENDING   Incomplete  CULTURE, BLOOD (ROUTINE X 2)     Status: None   Collection Time    10/30/13  7:19 PM      Result Value Ref Range Status   Specimen Description BLOOD ARM RIGHT   Final   Special Requests BOTTLES DRAWN AEROBIC AND ANAEROBIC 5CCBLUE 3CCRED   Final   Culture  Setup Time     Final   Value: 10/31/2013 00:21     Performed at [facility]   Culture     Final   Value:        BLOOD CULTURE RECEIVED NO GROWTH TO DATE CULTURE WILL BE HELD FOR 5 DAYS BEFORE ISSUING A FINAL NEGATIVE REPORT     Performed at [facility]   Report Status PENDING   Incomplete  CULTURE, BLOOD (ROUTINE X 2)     Status: None   Collection Time    10/30/13  7:31 PM      Result Value Ref Range Status   Specimen Description BLOOD RIGHT WRIST   Final   Special Requests BOTTLES DRAWN AEROBIC AND ANAEROBIC 6CC   Final   Culture  Setup Time     Final   Value: 10/31/2013 00:21     Performed at [facility]    Culture     Final   Value:        BLOOD CULTURE RECEIVED NO GROWTH TO DATE CULTURE WILL BE HELD FOR 5 DAYS BEFORE ISSUING A FINAL NEGATIVE REPORT     Performed at [facility]   Report Status PENDING   Incomplete  URINE CULTURE     Status: None   Collection Time    10/30/13  9:34 PM      Result Value Ref Range Status   Specimen Description URINE, RANDOM   Final   Special Requests NONE   Final   Culture  Setup Time     Final   Value: 10/30/2013 23:13     Performed at [place]     Final   Value: >=100,000 COLONIES/ML     Performed at [facility]   Culture     Final   Value: [place]     Performed at [facility]   Report Status PENDING   Incomplete    Coagulation Studies: No results found for this basename: LABPROT, INR,  in the last 72 hours  Urinalysis: No results found for this basename: COLORURINE, APPERANCEUR, LABSPEC, PHURINE, GLUCOSEU, HGBUR, BILIRUBINUR, KETONESUR, PROTEINUR, UROBILINOGEN, NITRITE, LEUKOCYTESUR,  in the last 72 hours    Imaging: No results found.   Medications:     . amoxicillin  500 mg Oral Q12H  . citalopram  40 mg Oral Daily  . citric acid-sodium citrate  15 mL Oral TID PC & HS  . feeding supplement (NEPRO CARB STEADY)  237 mL Oral TID BM  . heparin  5,000 Units Subcutaneous 3 times per day  . pantoprazole  80 mg Oral Daily  . potassium chloride  40 mEq Oral Once     Assessment/ Plan:  Acute on CKD creatinine static CT scan  reviewed Kidney Right scarred Left Hydro. Not sure that renal function will return to lower baseline . Initiated dialysis discussion. No urgent requirements. Discussed with Dr Moshe Cipro, who agrees with plan  Anemia stable  Metabolic acidosis Improved stop IV bicarbonate  Continue oral bicarbonate Hypokalemia replace Volume stable  --- output through Perc neph     Will sign off    ---- F/U in several weeks  Dr Ronald Lobo      LOS: 4 [place] Diener  W @TODAY @2 :40 PM

## 2013-11-04 LAB — URINE CULTURE
Colony Count: >=100000
Colony Count: >=100000

## 2013-11-04 LAB — VITAMIN D 1,25 DIHYDROXY
VITAMIN D3 1, 25 (OH): 14 pg/mL
Vitamin D 1, 25 (OH)2 Total: 14 pg/mL — ABNORMAL LOW (ref 18–72)
Vitamin D2 1, 25 (OH)2: <8 pg/mL

## 2013-11-06 ENCOUNTER — Encounter (HOSPITAL_COMMUNITY)
Admission: RE | Admit: 2013-11-06 | Discharge: 2013-11-06 | Disposition: A | Discharge status: Home / Self Care | Payer: BC Managed Care – PPO | Source: Ambulatory Visit | Attending: Nephrology | Admitting: Nephrology

## 2013-11-06 DIAGNOSIS — N189 Chronic kidney disease, unspecified: Secondary | ICD-10-CM | POA: Insufficient documentation

## 2013-11-06 DIAGNOSIS — D638 Anemia in other chronic diseases classified elsewhere: Secondary | ICD-10-CM | POA: Insufficient documentation

## 2013-11-06 LAB — CULTURE, BLOOD (ROUTINE X 2)
CULTURE: NO GROWTH
Culture: NO GROWTH

## 2013-11-06 LAB — IRON AND TIBC
Iron: 50 ug/dL (ref 42–135)
Saturation Ratios: 27 % (ref 20–55)
TIBC: 188 ug/dL — AB (ref 215–435)
UIBC: 138 ug/dL (ref 125–400)

## 2013-11-06 LAB — POCT HEMOGLOBIN-HEMACUE: HEMOGLOBIN: 9.5 g/dL — AB (ref 13.0–17.0)

## 2013-11-06 LAB — FERRITIN: Ferritin: 557 ng/mL — ABNORMAL HIGH (ref 22–322)

## 2013-11-06 MED ORDER — EPOETIN ALFA 20000 UNIT/ML IJ SOLN
20000.0000 [IU] | INTRAMUSCULAR | Status: DC
  Administered 2013-11-06: 20000 [IU] via SUBCUTANEOUS

## 2013-11-06 MED ORDER — EPOETIN ALFA 20000 UNIT/ML IJ SOLN
INTRAMUSCULAR | Status: AC
  Administered 2013-11-06: 20000 [IU] via SUBCUTANEOUS
  Filled 2013-11-06: qty 1

## 2013-11-20 ENCOUNTER — Encounter (HOSPITAL_COMMUNITY)
Admission: RE | Admit: 2013-11-20 | Discharge: 2013-11-20 | Disposition: A | Discharge status: Home / Self Care | Payer: BC Managed Care – PPO | Source: Ambulatory Visit | Attending: Nephrology | Admitting: Nephrology

## 2013-11-20 DIAGNOSIS — N189 Chronic kidney disease, unspecified: Secondary | ICD-10-CM | POA: Insufficient documentation

## 2013-11-20 DIAGNOSIS — N039 Chronic nephritic syndrome with unspecified morphologic changes: Secondary | ICD-10-CM | POA: Diagnosis present

## 2013-11-20 DIAGNOSIS — D631 Anemia in chronic kidney disease: Secondary | ICD-10-CM | POA: Diagnosis present

## 2013-11-20 DIAGNOSIS — I129 Hypertensive chronic kidney disease with stage 1 through stage 4 chronic kidney disease, or unspecified chronic kidney disease: Secondary | ICD-10-CM | POA: Diagnosis present

## 2013-11-20 LAB — POCT HEMOGLOBIN-HEMACUE
HEMOGLOBIN: 13.6 g/dL (ref 13.0–17.0)
Hemoglobin: 13.4 g/dL (ref 13.0–17.0)

## 2013-11-20 MED ORDER — EPOETIN ALFA 20000 UNIT/ML IJ SOLN
INTRAMUSCULAR | Status: AC
  Filled 2013-11-20: qty 1

## 2013-11-20 MED ORDER — EPOETIN ALFA 20000 UNIT/ML IJ SOLN: 20000.0000 [IU] | INTRAMUSCULAR | Status: DC

## 2013-11-27 ENCOUNTER — Inpatient Hospital Stay (HOSPITAL_COMMUNITY)
Admission: EM | Admit: 2013-11-27 | Discharge: 2013-12-01 | DRG: 682 | Disposition: A | Discharge status: Home / Self Care | Payer: BC Managed Care – PPO | Attending: Internal Medicine | Admitting: Internal Medicine

## 2013-11-27 ENCOUNTER — Encounter (HOSPITAL_COMMUNITY): Payer: Self-pay | Admitting: Emergency Medicine

## 2013-11-27 DIAGNOSIS — Z936 Other artificial openings of urinary tract status: Secondary | ICD-10-CM

## 2013-11-27 DIAGNOSIS — N1330 Unspecified hydronephrosis: Secondary | ICD-10-CM | POA: Diagnosis present

## 2013-11-27 DIAGNOSIS — I129 Hypertensive chronic kidney disease with stage 1 through stage 4 chronic kidney disease, or unspecified chronic kidney disease: Secondary | ICD-10-CM | POA: Diagnosis present

## 2013-11-27 DIAGNOSIS — R5381 Other malaise: Secondary | ICD-10-CM

## 2013-11-27 DIAGNOSIS — N039 Chronic nephritic syndrome with unspecified morphologic changes: Secondary | ICD-10-CM | POA: Diagnosis present

## 2013-11-27 DIAGNOSIS — E872 Acidosis, unspecified: Secondary | ICD-10-CM | POA: Diagnosis present

## 2013-11-27 DIAGNOSIS — M6281 Muscle weakness (generalized): Secondary | ICD-10-CM | POA: Diagnosis present

## 2013-11-27 DIAGNOSIS — F329 Major depressive disorder, single episode, unspecified: Secondary | ICD-10-CM | POA: Diagnosis present

## 2013-11-27 DIAGNOSIS — I10 Essential (primary) hypertension: Secondary | ICD-10-CM

## 2013-11-27 DIAGNOSIS — E119 Type 2 diabetes mellitus without complications: Secondary | ICD-10-CM | POA: Diagnosis present

## 2013-11-27 DIAGNOSIS — E876 Hypokalemia: Secondary | ICD-10-CM | POA: Diagnosis present

## 2013-11-27 DIAGNOSIS — N179 Acute kidney failure, unspecified: Secondary | ICD-10-CM | POA: Diagnosis present

## 2013-11-27 DIAGNOSIS — Z8551 Personal history of malignant neoplasm of bladder: Secondary | ICD-10-CM | POA: Diagnosis not present

## 2013-11-27 DIAGNOSIS — IMO0002 Reserved for concepts with insufficient information to code with codable children: Secondary | ICD-10-CM

## 2013-11-27 DIAGNOSIS — K5900 Constipation, unspecified: Secondary | ICD-10-CM | POA: Diagnosis present

## 2013-11-27 DIAGNOSIS — N12 Tubulo-interstitial nephritis, not specified as acute or chronic: Secondary | ICD-10-CM | POA: Diagnosis present

## 2013-11-27 DIAGNOSIS — F411 Generalized anxiety disorder: Secondary | ICD-10-CM | POA: Diagnosis present

## 2013-11-27 DIAGNOSIS — E43 Unspecified severe protein-calorie malnutrition: Secondary | ICD-10-CM | POA: Diagnosis present

## 2013-11-27 DIAGNOSIS — N183 Chronic kidney disease, stage 3 unspecified: Secondary | ICD-10-CM | POA: Diagnosis present

## 2013-11-27 DIAGNOSIS — K219 Gastro-esophageal reflux disease without esophagitis: Secondary | ICD-10-CM | POA: Diagnosis present

## 2013-11-27 DIAGNOSIS — E860 Dehydration: Secondary | ICD-10-CM | POA: Diagnosis present

## 2013-11-27 DIAGNOSIS — D631 Anemia in chronic kidney disease: Secondary | ICD-10-CM | POA: Diagnosis present

## 2013-11-27 DIAGNOSIS — F3289 Other specified depressive episodes: Secondary | ICD-10-CM | POA: Diagnosis present

## 2013-11-27 DIAGNOSIS — E46 Unspecified protein-calorie malnutrition: Secondary | ICD-10-CM | POA: Insufficient documentation

## 2013-11-27 DIAGNOSIS — N390 Urinary tract infection, site not specified: Secondary | ICD-10-CM | POA: Diagnosis present

## 2013-11-27 DIAGNOSIS — E8729 Other acidosis: Secondary | ICD-10-CM

## 2013-11-27 DIAGNOSIS — R5383 Other fatigue: Secondary | ICD-10-CM

## 2013-11-27 DIAGNOSIS — C679 Malignant neoplasm of bladder, unspecified: Secondary | ICD-10-CM

## 2013-11-27 DIAGNOSIS — Z87891 Personal history of nicotine dependence: Secondary | ICD-10-CM

## 2013-11-27 DIAGNOSIS — M109 Gout, unspecified: Secondary | ICD-10-CM | POA: Diagnosis present

## 2013-11-27 DIAGNOSIS — R531 Weakness: Secondary | ICD-10-CM

## 2013-11-27 LAB — COMPREHENSIVE METABOLIC PANEL
ALBUMIN: 3.6 g/dL (ref 3.5–5.2)
ALK PHOS: 57 U/L (ref 39–117)
ALT: 10 U/L (ref 0–53)
ALT: 8 U/L (ref 0–53)
AST: 12 U/L (ref 0–37)
AST: 9 U/L (ref 0–37)
Albumin: 3.4 g/dL — ABNORMAL LOW (ref 3.5–5.2)
Alkaline Phosphatase: 51 U/L (ref 39–117)
Anion gap: 14 (ref 5–15)
Anion gap: 14 (ref 5–15)
BILIRUBIN TOTAL: 0.2 mg/dL — AB (ref 0.3–1.2)
BUN: 83 mg/dL — ABNORMAL HIGH (ref 6–23)
BUN: 82 mg/dL — ABNORMAL HIGH (ref 6–23)
CALCIUM: 11.1 mg/dL — AB (ref 8.4–10.5)
CHLORIDE: 109 meq/L (ref 96–112)
CO2: 11 meq/L — AB (ref 19–32)
CO2: 11 mEq/L — ABNORMAL LOW (ref 19–32)
CREATININE: 3.42 mg/dL — AB (ref 0.50–1.35)
CREATININE: 3.25 mg/dL — AB (ref 0.50–1.35)
Calcium: 11.8 mg/dL — ABNORMAL HIGH (ref 8.4–10.5)
Chloride: 112 mEq/L (ref 96–112)
GFR calc Af Amer: 22 mL/min — ABNORMAL LOW (ref >90)
GFR calc non Af Amer: 19 mL/min — ABNORMAL LOW (ref >90)
GFR, EST AFRICAN AMERICAN: 23 mL/min — AB (ref >90)
GFR, EST NON AFRICAN AMERICAN: 20 mL/min — AB (ref >90)
GLUCOSE: 92 mg/dL (ref 70–99)
Glucose, Bld: 113 mg/dL — ABNORMAL HIGH (ref 70–99)
POTASSIUM: 4.4 meq/L (ref 3.7–5.3)
Potassium: 4.3 mEq/L (ref 3.7–5.3)
SODIUM: 137 meq/L (ref 137–147)
Sodium: 134 mEq/L — ABNORMAL LOW (ref 137–147)
Total Bilirubin: 0.2 mg/dL — ABNORMAL LOW (ref 0.3–1.2)
Total Protein: 7.8 g/dL (ref 6.0–8.3)
Total Protein: 7.1 g/dL (ref 6.0–8.3)

## 2013-11-27 LAB — CBC WITH DIFFERENTIAL/PLATELET
BASOS ABS: 0 10*3/uL (ref 0.0–0.1)
BASOS ABS: 0 10*3/uL (ref 0.0–0.1)
BASOS PCT: 0 % (ref 0–1)
Basophils Relative: 0 % (ref 0–1)
Eosinophils Absolute: 0.1 10*3/uL (ref 0.0–0.7)
Eosinophils Absolute: 0.2 10*3/uL (ref 0.0–0.7)
Eosinophils Relative: 1 % (ref 0–5)
Eosinophils Relative: 2 % (ref 0–5)
HCT: 41.5 % (ref 39.0–52.0)
HEMATOCRIT: 37.4 % — AB (ref 39.0–52.0)
Hemoglobin: 13.9 g/dL (ref 13.0–17.0)
Hemoglobin: 12.8 g/dL — ABNORMAL LOW (ref 13.0–17.0)
LYMPHS PCT: 22 % (ref 12–46)
LYMPHS PCT: 25 % (ref 12–46)
Lymphs Abs: 2 10*3/uL (ref 0.7–4.0)
Lymphs Abs: 2.5 10*3/uL (ref 0.7–4.0)
MCH: 27.8 pg (ref 26.0–34.0)
MCH: 27.8 pg (ref 26.0–34.0)
MCHC: 33.5 g/dL (ref 30.0–36.0)
MCHC: 34.2 g/dL (ref 30.0–36.0)
MCV: 83 fL (ref 78.0–100.0)
MCV: 81.3 fL (ref 78.0–100.0)
MONO ABS: 0.5 10*3/uL (ref 0.1–1.0)
Monocytes Absolute: 0.5 10*3/uL (ref 0.1–1.0)
Monocytes Relative: 6 % (ref 3–12)
Monocytes Relative: 5 % (ref 3–12)
NEUTROS ABS: 6.6 10*3/uL (ref 1.7–7.7)
NEUTROS PCT: 68 % (ref 43–77)
Neutro Abs: 6.9 10*3/uL (ref 1.7–7.7)
Neutrophils Relative %: 71 % (ref 43–77)
PLATELETS: 274 10*3/uL (ref 150–400)
Platelets: 243 10*3/uL (ref 150–400)
RBC: 5 MIL/uL (ref 4.22–5.81)
RBC: 4.6 MIL/uL (ref 4.22–5.81)
RDW: 17.4 % — AB (ref 11.5–15.5)
RDW: 17 % — ABNORMAL HIGH (ref 11.5–15.5)
WBC: 9.2 10*3/uL (ref 4.0–10.5)
WBC: 9.9 10*3/uL (ref 4.0–10.5)

## 2013-11-27 LAB — I-STAT VENOUS BLOOD GAS, ED
ACID-BASE DEFICIT: 17 mmol/L — AB (ref 0.0–2.0)
Bicarbonate: 10.9 mEq/L — ABNORMAL LOW (ref 20.0–24.0)
O2 Saturation: 59 %
PH VEN: 7.147 — AB (ref 7.250–7.300)
TCO2: 12 mmol/L (ref 0–100)
pCO2, Ven: 31.4 mmHg — ABNORMAL LOW (ref 45.0–50.0)
pO2, Ven: 39 mmHg (ref 30.0–45.0)

## 2013-11-27 LAB — PHOSPHORUS: PHOSPHORUS: 3.3 mg/dL (ref 2.3–4.6)

## 2013-11-27 LAB — URINALYSIS, ROUTINE W REFLEX MICROSCOPIC
Bilirubin Urine: NEGATIVE
Glucose, UA: NEGATIVE mg/dL
Ketones, ur: NEGATIVE mg/dL
Nitrite: NEGATIVE
PH: 7 (ref 5.0–8.0)
Protein, ur: 100 mg/dL — AB
Specific Gravity, Urine: 1.014 (ref 1.005–1.030)
Urobilinogen, UA: 0.2 mg/dL (ref 0.0–1.0)

## 2013-11-27 LAB — PROTIME-INR
INR: 1.18 (ref 0.00–1.49)
PROTHROMBIN TIME: 15 s (ref 11.6–15.2)

## 2013-11-27 LAB — URINE MICROSCOPIC-ADD ON

## 2013-11-27 LAB — GLUCOSE, CAPILLARY: Glucose-Capillary: 71 mg/dL (ref 70–99)

## 2013-11-27 LAB — APTT: aPTT: 31 seconds (ref 24–37)

## 2013-11-27 LAB — MAGNESIUM: Magnesium: 2.2 mg/dL (ref 1.5–2.5)

## 2013-11-27 LAB — TSH: TSH: 2.36 u[IU]/mL (ref 0.350–4.500)

## 2013-11-27 MED ORDER — ONDANSETRON HCL 4 MG/2ML IJ SOLN: 4.0000 mg | Freq: Four times a day (QID) | INTRAMUSCULAR | Status: DC | PRN

## 2013-11-27 MED ORDER — CITRIC ACID-SODIUM CITRATE 334-500 MG/5ML PO SOLN
15.0000 mL | Freq: Three times a day (TID) | ORAL | Status: DC
  Administered 2013-11-27 – 2013-11-29 (×10): 15 mL via ORAL
  Filled 2013-11-27 (×11): qty 15

## 2013-11-27 MED ORDER — LEVOFLOXACIN 750 MG PO TABS
750.0000 mg | ORAL_TABLET | ORAL | Status: DC
  Administered 2013-11-27: 750 mg via ORAL
  Filled 2013-11-27: qty 1

## 2013-11-27 MED ORDER — HYDROCODONE-ACETAMINOPHEN 5-325 MG PO TABS: 1.0000 | ORAL_TABLET | ORAL | Status: DC | PRN

## 2013-11-27 MED ORDER — ACETAMINOPHEN 650 MG RE SUPP: 650.0000 mg | Freq: Four times a day (QID) | RECTAL | Status: DC | PRN

## 2013-11-27 MED ORDER — PANTOPRAZOLE SODIUM 40 MG PO TBEC
40.0000 mg | DELAYED_RELEASE_TABLET | Freq: Every day | ORAL | Status: DC
Start: 2013-11-27 — End: 2013-12-01
  Administered 2013-11-27 – 2013-12-01 (×5): 40 mg via ORAL
  Filled 2013-11-27 (×4): qty 1

## 2013-11-27 MED ORDER — ONDANSETRON HCL 4 MG PO TABS: 4.0000 mg | ORAL_TABLET | Freq: Four times a day (QID) | ORAL | Status: DC | PRN

## 2013-11-27 MED ORDER — SODIUM CHLORIDE 0.9 % IV SOLN
INTRAVENOUS | Status: DC
  Administered 2013-11-27: 14:00:00 via INTRAVENOUS

## 2013-11-27 MED ORDER — CITALOPRAM HYDROBROMIDE 40 MG PO TABS
40.0000 mg | ORAL_TABLET | Freq: Every day | ORAL | Status: DC
  Administered 2013-11-28: 40 mg via ORAL
  Filled 2013-11-27: qty 1

## 2013-11-27 MED ORDER — SODIUM CHLORIDE 0.9 % IJ SOLN
3.0000 mL | Freq: Two times a day (BID) | INTRAMUSCULAR | Status: DC
  Administered 2013-11-29 – 2013-12-01 (×3): 3 mL via INTRAVENOUS

## 2013-11-27 MED ORDER — ACETAMINOPHEN 325 MG PO TABS: 650.0000 mg | ORAL_TABLET | Freq: Four times a day (QID) | ORAL | Status: DC | PRN

## 2013-11-27 MED ORDER — SODIUM CHLORIDE 0.9 % IV SOLN
INTRAVENOUS | Status: AC
  Administered 2013-11-27 – 2013-11-28 (×2): via INTRAVENOUS

## 2013-11-27 NOTE — ED Notes (Signed)
NOTIFIED DR. GOLDSTON FOR PATIENTS LAB RESULTS OF CG4+ LACTIC ACID @15 :17 PM ,11/27/2013.

## 2013-11-27 NOTE — ED Provider Notes (Signed)
CSN: [id number]     Arrival date & time 11/27/13  1129 History  This chart was scribed for non-physician practitioner Montine Circle, PA-C working with Ephraim Hamburger, MD by Ludger Nutting, ED Scribe. This patient was seen in room C26C/C26C and the patient's care was started at 12:51 PM.    Chief Complaint  Patient presents with  . Dehydration    The history is provided by the patient. No language interpreter was used.    HPI Comments: [name] [name] is a 53 y.o. male who presents to the Emergency Department complaining of gradual onset, constant generalized fatigue and decreased appetite for the past few days. Patient was admitted on 123456 for metabolic acidosis and an acute renal injury. Patient states he was discharged from the hospital around 11/14/13. Patient states he is able to produce urine though he has noticed occasional dysuria. He denies current pain.   Past Medical History  Diagnosis Date  . Gout     recent flair -bilateral feet--  STABLE  . Frequency of urination   . Urgency incontinence   . Nocturia   . Diabetes mellitus type 2, diet-controlled PT STOPPED TAKING METFORMIN--  HAS BEEN WATCHING DIET, EXERCISING AND LOSING WT  . Bladder cancer 2013  . Complication of anesthesia     agitation w/awakening in 9/13,OK 01/24/12  . Hypertension   . Anxiety   . Depression   . GERD (gastroesophageal reflux disease)    Past Surgical History  Procedure Laterality Date  . Ulner artery repair Right 2009    rt -trauma /thrombectomy,repair  . Transurethral resection of bladder tumor  12/15/2011    Procedure: TRANSURETHRAL RESECTION OF BLADDER TUMOR (TURBT);  Surgeon: Molli Hazard, MD;  Location: [facility];  Service: Urology;  Laterality: N/A;  2 HRS   . Lumbar laminectomy  06-02-2005    W/ RESECTION NERVE ROOT  L4  -  L5  . Transurethral resection of bladder tumor  01/24/2012    Procedure: TRANSURETHRAL RESECTION OF BLADDER TUMOR (TURBT);  Surgeon:  Molli Hazard, MD;  Location: [facility];  Service: Urology;  Laterality: N/A;  90 MIN   . Cystoscopy with urethral dilatation  01/24/2012    Procedure: CYSTOSCOPY WITH URETHRAL DILATATION;  Surgeon: Molli Hazard, MD;  Location: [facility];  Service: Urology;  Laterality: N/A;  . Transurethral resection of bladder tumor N/A 07/10/2012    Procedure: TRANSURETHRAL RESECTION OF BLADDER TUMOR (TURBT);  Surgeon: Molli Hazard, MD;  Location: [facility];  Service: Urology;  Laterality: N/A;  . Cystoscopy w/ retrogrades Bilateral 07/10/2012    Procedure: CYSTOSCOPY WITH RETROGRADE PYELOGRAM;  Surgeon: Molli Hazard, MD;  Location: [facility];  Service: Urology;  Laterality: Bilateral;  . Back surgery     Family History  Problem Relation Age of Onset  . Diabetes Mellitus II Other   . Hypertension Other   . Bladder Cancer Neg Hx    History  Substance Use Topics  . Smoking status: Former Smoker -- 1.00 packs/day for 16 years    Types: Cigarettes    Quit date: 01/20/1992  . Smokeless tobacco: Former Systems developer    Types: [place] date: 01/20/1992  . Alcohol Use: No    Review of Systems  Constitutional: Positive for appetite change (decreased) and fatigue.  Genitourinary: Positive for dysuria.  All other systems reviewed and are negative.     Allergies  Lisinopril  Home  Medications   Prior to Admission medications   Medication Sig Start Date End Date Taking? Authorizing Provider  amoxicillin (AMOXIL) 500 MG capsule Take 1 capsule (500 mg total) by mouth every 12 (twelve) hours. 10 days 11/03/13   Bonnielee Haff, MD  citalopram (CELEXA) 40 MG tablet Take 40 mg by mouth daily.    Historical Provider, MD  citric acid-sodium citrate (ORACIT) 334-500 MG/5ML solution Take 15 mLs by mouth 4 (four) times daily - after meals and at bedtime. 11/03/13   Bonnielee Haff, MD  omeprazole (PRILOSEC) 40 MG capsule  Take 1 capsule (40 mg total) by mouth 2 (two) times daily before a meal. 06/28/13   Barton Dubois, MD   BP 101/60  Pulse 105  Temp(Src) 97.5 F (36.4 C) (Oral)  Resp 20  SpO2 99% Physical Exam  Nursing note and vitals reviewed. Constitutional: He is oriented to person, place, and time. He appears well-developed and well-nourished.  HENT:  Head: Normocephalic and atraumatic.  Eyes: Conjunctivae and EOM are normal. Pupils are equal, round, and reactive to light. Right eye exhibits no discharge. Left eye exhibits no discharge. No scleral icterus.  Neck: Normal range of motion. Neck supple. No JVD present.  Cardiovascular: Normal rate, regular rhythm and normal heart sounds.  Exam reveals no gallop and no friction rub.   No murmur heard. Pulmonary/Chest: Effort normal and breath sounds normal. No respiratory distress. He has no wheezes. He has no rales. He exhibits no tenderness.  Abdominal: Soft. He exhibits no distension and no mass. There is no tenderness. There is no rebound and no guarding.  Genitourinary:  Urostomy at the right flank, no evidence of infection or blockage.  Musculoskeletal: Normal range of motion. He exhibits no edema and no tenderness.  Neurological: He is alert and oriented to person, place, and time.  Skin: Skin is warm and dry.  Psychiatric: He has a normal mood and affect. His behavior is normal. Judgment and thought content normal.    ED Course  Procedures (including critical care time)  DIAGNOSTIC STUDIES: Oxygen Saturation is 99% on RA, normal by my interpretation.    COORDINATION OF CARE: 12:58 PM Discussed treatment plan with pt at bedside and pt agreed to plan.   Results for orders placed during the hospital encounter of 11/27/13  CBC WITH DIFFERENTIAL      Result Value Ref Range   WBC 9.2  4.0 - 10.5 K/uL   RBC 5.00  4.22 - 5.81 MIL/uL   Hemoglobin 13.9  13.0 - 17.0 g/dL   HCT 41.5  39.0 - 52.0 %   MCV 83.0  78.0 - 100.0 fL   MCH 27.8  26.0 -  34.0 pg   MCHC 33.5  30.0 - 36.0 g/dL   RDW 17.4 (*) 11.5 - 15.5 %   Platelets 274  150 - 400 K/uL   Neutrophils Relative % 71  43 - 77 %   Neutro Abs 6.6  1.7 - 7.7 K/uL   Lymphocytes Relative 22  12 - 46 %   Lymphs Abs 2.0  0.7 - 4.0 K/uL   Monocytes Relative 6  3 - 12 %   Monocytes Absolute 0.5  0.1 - 1.0 K/uL   Eosinophils Relative 1  0 - 5 %   Eosinophils Absolute 0.1  0.0 - 0.7 K/uL   Basophils Relative 0  0 - 1 %   Basophils Absolute 0.0  0.0 - 0.1 K/uL  COMPREHENSIVE METABOLIC PANEL      Result  Value Ref Range   Sodium 134 (*) 137 - 147 mEq/L   Potassium 4.4  3.7 - 5.3 mEq/L   Chloride 109  96 - 112 mEq/L   CO2 11 (*) 19 - 32 mEq/L   Glucose, Bld 113 (*) 70 - 99 mg/dL   BUN 83 (*) 6 - 23 mg/dL   Creatinine, Ser 3.42 (*) 0.50 - 1.35 mg/dL   Calcium 11.8 (*) 8.4 - 10.5 mg/dL   Total Protein 7.8  6.0 - 8.3 g/dL   Albumin 3.6  3.5 - 5.2 g/dL   AST 12  0 - 37 U/L   ALT 10  0 - 53 U/L   Alkaline Phosphatase 57  39 - 117 U/L   Total Bilirubin 0.2 (*) 0.3 - 1.2 mg/dL   GFR calc non Af Amer 19 (*) >90 mL/min   GFR calc Af Amer 22 (*) >90 mL/min   Anion gap 14  5 - 15  URINALYSIS, ROUTINE W REFLEX MICROSCOPIC      Result Value Ref Range   Color, Urine YELLOW  YELLOW   APPearance CLOUDY (*) CLEAR   Specific Gravity, Urine 1.014  1.005 - 1.030   pH 7.0  5.0 - 8.0   Glucose, UA NEGATIVE  NEGATIVE mg/dL   Hgb urine dipstick MODERATE (*) NEGATIVE   Bilirubin Urine NEGATIVE  NEGATIVE   Ketones, ur NEGATIVE  NEGATIVE mg/dL   Protein, ur 100 (*) NEGATIVE mg/dL   Urobilinogen, UA 0.2  0.0 - 1.0 mg/dL   Nitrite NEGATIVE  NEGATIVE   Leukocytes, UA LARGE (*) NEGATIVE  URINE MICROSCOPIC-ADD ON      Result Value Ref Range   Squamous Epithelial / LPF RARE  RARE   WBC, UA TOO NUMEROUS TO COUNT  <3 WBC/hpf   RBC / HPF 3-6  <3 RBC/hpf   Bacteria, UA MANY (*) RARE   Ct Abdomen Pelvis Wo Contrast  10/30/2013   CLINICAL DATA:  Patient status post recent placement of right percutaneous  nephroureteral stent. Lower abdominal pain.  EXAM: CT ABDOMEN AND PELVIS WITHOUT CONTRAST  TECHNIQUE: Multidetector CT imaging of the abdomen and pelvis was performed following the standard protocol without IV contrast.  COMPARISON:  CT abdomen pelvis 05/21/2013.  FINDINGS: Visualization of the lower thorax demonstrates minimal dependent atelectasis. Normal heart size.  Lack of intravenous contrast material limits evaluation of the solid organ parenchyma.  Noncontrast enhanced liver is unremarkable. The gallbladder is contracted. The spleen, pancreas bilateral adrenal glands are unremarkable.  Re- demonstrated right-sided nephrolithiasis. Interval removal of left nephroureteral stent with mild left hydroureteronephrosis. Interval placement of right percutaneous nephroureteral stent coiled in the right renal collecting system and distally within the neobladder. Extensive fat stranding about the right renal collecting system.  Neobladder visualized within the pelvis, slightly more distended than on prior examination. Small amount of gas anteriorly within the neobladder.  Normal caliber abdominal aorta. No retroperitoneal lymphadenopathy. Extensive pelvic surgical clips. Patient status post cystectomy.  Stool throughout the colon. Normal appendix. No evidence for bowel obstruction. No free fluid or free intraperitoneal air.  Lower lumbar spine degenerative change with bilateral L4 pars defects and grade 1 anterolisthesis of L4 on L5. Re- demonstrated T11 vertebral compression fracture.  IMPRESSION: 1. Patient status post removal of left percutaneous nephroureteral stent. There is persistent mild left hydroureteronephrosis to the level of the neobladder. 2. Interval insertion of right percutaneous nephroureteral stent with the distal aspect coiled in the neobladder. There is extensive stranding about the right renal  collecting system and right renal hilum which is nonspecific and may be postprocedural in etiology. An  infectious process could appear similar. There is persistent moderate right hydronephrosis. 3. Gas within neobladder, potentially post procedural in etiology. Infection not excluded. Interval removal of suprapubic catheter. Neobladder appears more distended than on prior examination.   Electronically Signed   By: Lovey Newcomer M.D.   On: 10/30/2013 20:36   X-ray Chest Pa And Lateral   10/30/2013   CLINICAL DATA:  Weakness.  EXAM: CHEST  2 VIEW  COMPARISON:  Chest x-rays dated 06/25/2013 and 05/24/2013  FINDINGS: Heart size and pulmonary vascularity are normal and the lungs are clear. No acute osseous abnormality. Old compression fracture in the lower thoracic spine, stable.  IMPRESSION: No acute abnormality.   Electronically Signed   By: Rozetta Nunnery M.D.   On: 10/30/2013 20:28   Ct Head Wo Contrast  10/30/2013   CLINICAL DATA:  Weakness. The patient has fallen twice and struck his head. No loss of consciousness.  EXAM: CT HEAD WITHOUT CONTRAST  TECHNIQUE: Contiguous axial images were obtained from the base of the skull through the vertex without intravenous contrast.  COMPARISON:  CT scan dated 10/28/2005  FINDINGS: No mass lesion. No midline shift. No acute hemorrhage or hematoma. No extra-axial fluid collections. No evidence of acute infarction. Brain parenchyma is normal. Osseous structures are normal.  IMPRESSION: Normal exam.   Electronically Signed   By: Rozetta Nunnery M.D.   On: 10/30/2013 20:17     No results found.   EKG Interpretation None      MDM   Final diagnoses:  AKI (acute kidney injury)  Metabolic acidosis, increased anion gap  UTI (lower urinary tract infection)   Patient with complaint of decreased oral intake.  Recently admitted by [facility] for AKI, metabolic acidosis, and UTI.  Will check labs and will reassess.  CMP is concerning for AKI.  Likely acute on chronic.  Creatinine is now 3.42.  Possibly 2/2 UTI.  Patient discussed with Dr. Regenia Skeeter, who recommends VBG and  admission.  PH is critical at 7.147  CRITICAL CARE Performed by: Montine Circle   Total critical care time: 35  Critical care time was exclusive of separately billable procedures and treating other patients.  Critical care was necessary to treat or prevent imminent or life-threatening deterioration.  Critical care was time spent personally by me on the following activities: development of treatment plan with patient and/or surrogate as well as nursing, discussions with consultants, evaluation of patient's response to treatment, examination of patient, obtaining history from patient or surrogate, ordering and performing treatments and interventions, ordering and review of laboratory studies, ordering and review of radiographic studies, pulse oximetry and re-evaluation of patient's condition.   I personally performed the services described in this documentation, which was scribed in my presence. The recorded information has been reviewed and is accurate.     Montine Circle, PA-C 11/27/13 1529

## 2013-11-27 NOTE — H&P (Signed)
[facility] History and Physical  KWMANE BRADEN [id number] DOB: 10/21/1960 DOA: 11/27/2013  Referring physician: ER physician PCP: Florina Ou, MD   Chief Complaint: fatigue, weakness, poor po intake   HPI:  53 year old male with past medical history of bladder cancer, has right nephroureteral stent, recent admission for UTI who presented to [facility] ED 11/27/2013 with progressive weakness, fatigue and poor po intake for past few days prior to this admission. No complaints of fevers or chills. No blood in urine noted. No flank pain. No abdominal pain, nausea or vomiting. No other complaints such as chest pain, shortness of breath or palpitations.  In ED, his vitals are stable. Urinalysis showed  leukocytes any many bacteria. He was already on Levaquin so this was continue however due to worsening renal function dosage was changed to every other day. Creatinine on admission 3.42.  Assessment & Plan    Active Problems: Acute renal failure and metabolic acidosis   Possibly prerenal, dehydration or due to Levaquin not renally adjusted  Continue IV fluids  Creatinine  trending down from 3.42 to 3.25  UTI  Continue levaquin but every 48 hours  Collect urine culture  History of Bladder cancer and Right Nephroureteral Stent   Monitor urine output Hypercalcemia   Dehydration likely cause  Follow up BMP in am after hydration Generalized Weakness/Fatigue   PT evaluation ordered  DVT prophylaxis:  SCD's bilaterally   Radiological Exams on Admission: No results found.   Code Status: Full Family Communication: Plan of care discussed with the patient  Disposition Plan: Admit for further evaluation  Leisa Lenz, MD  [facility] Pager [phone]  Review of Systems:  Constitutional: Negative for fever, chills and positive for malaise/fatigue. Negative for diaphoresis.  HENT: Negative for hearing loss, ear pain, nosebleeds, congestion, sore throat, neck pain,  tinnitus and ear discharge.   Eyes: Negative for blurred vision, double vision, photophobia, pain, discharge and redness.  Respiratory: Negative for cough, hemoptysis, sputum production, shortness of breath, wheezing and stridor.   Cardiovascular: Negative for chest pain, palpitations, orthopnea, claudication and leg swelling.  Gastrointestinal: Negative for nausea, vomiting and abdominal pain. Negative for heartburn, constipation, blood in stool and melena.  Genitourinary: Negative for dysuria, urgency, frequency, hematuria and flank pain.  Musculoskeletal: Negative for myalgias, back pain, joint pain and falls.  Skin: Negative for itching and rash.  Neurological: Negative for dizziness and positive for weakness. Negative for tingling, tremors, sensory change, speech change, focal weakness, loss of consciousness and headaches.  Endo/Heme/Allergies: Negative for environmental allergies and polydipsia. Does not bruise/bleed easily.  Psychiatric/Behavioral: Negative for suicidal ideas. The patient is not nervous/anxious.      Past Medical History  Diagnosis Date  . Gout     recent flair -bilateral feet--  STABLE  . Frequency of urination   . Urgency incontinence   . Nocturia   . Diabetes mellitus type 2, diet-controlled PT STOPPED TAKING METFORMIN--  HAS BEEN WATCHING DIET, EXERCISING AND LOSING WT  . Bladder cancer 2013  . Complication of anesthesia     agitation w/awakening in 9/13,OK 01/24/12  . Hypertension   . Anxiety   . Depression   . GERD (gastroesophageal reflux disease)    Past Surgical History  Procedure Laterality Date  . Ulner artery repair Right 2009    rt -trauma /thrombectomy,repair  . Transurethral resection of bladder tumor  12/15/2011    Procedure: TRANSURETHRAL RESECTION OF BLADDER TUMOR (TURBT);  Surgeon: Molli Hazard, MD;  Location: [facility];  Service: Urology;  Laterality: N/A;  2 HRS   . Lumbar laminectomy  06-02-2005    W/  RESECTION NERVE ROOT  L4  -  L5  . Transurethral resection of bladder tumor  01/24/2012    Procedure: TRANSURETHRAL RESECTION OF BLADDER TUMOR (TURBT);  Surgeon: Molli Hazard, MD;  Location: [facility];  Service: Urology;  Laterality: N/A;  90 MIN   . Cystoscopy with urethral dilatation  01/24/2012    Procedure: CYSTOSCOPY WITH URETHRAL DILATATION;  Surgeon: Molli Hazard, MD;  Location: [facility];  Service: Urology;  Laterality: N/A;  . Transurethral resection of bladder tumor N/A 07/10/2012    Procedure: TRANSURETHRAL RESECTION OF BLADDER TUMOR (TURBT);  Surgeon: Molli Hazard, MD;  Location: [facility];  Service: Urology;  Laterality: N/A;  . Cystoscopy w/ retrogrades Bilateral 07/10/2012    Procedure: CYSTOSCOPY WITH RETROGRADE PYELOGRAM;  Surgeon: Molli Hazard, MD;  Location: [facility];  Service: Urology;  Laterality: Bilateral;  . Back surgery     Social History:  reports that he quit smoking about 21 years ago. His smoking use included Cigarettes. He has a 16 pack-year smoking history. He quit smokeless tobacco use about 21 years ago. His smokeless tobacco use included Chew. He reports that he does not drink alcohol or use illicit drugs.  Allergies  Allergen Reactions  . Lisinopril Cough    Family History:  Family History  Problem Relation Age of Onset  . Diabetes Mellitus II Other   . Hypertension Other   . Bladder Cancer Neg Hx      Prior to Admission medications   Medication Sig Start Date End Date Taking? Authorizing Provider  citalopram (CELEXA) 40 MG tablet Take 40 mg by mouth daily.   Yes Historical Provider, MD  citric acid-sodium citrate (ORACIT) 334-500 MG/5ML solution Take 15 mLs by mouth 4 (four) times daily - after meals and at bedtime. 11/03/13  Yes Bonnielee Haff, MD  levofloxacin (LEVAQUIN) 750 MG tablet Take 750 mg by mouth daily.   Yes Historical Provider, MD   omeprazole (PRILOSEC) 40 MG capsule Take 1 capsule (40 mg total) by mouth 2 (two) times daily before a meal. 06/28/13  Yes Barton Dubois, MD   Physical Exam: Filed Vitals:   11/27/13 1142 11/27/13 1146 11/27/13 1500  BP: 101/60 120/65 95/58  Pulse: 105 85 77  Temp: 97.5 F (36.4 C)    TempSrc: Oral    Resp: 20 20 18   SpO2: 99% 100% 100%    Physical Exam  Constitutional: Appears well-developed and well-nourished. No distress.  HENT: Normocephalic. No tonsillar erythema or exudates Eyes: Conjunctivae and EOM are normal. PERRLA, no scleral icterus.  Neck: Normal ROM. Neck supple. No JVD. No tracheal deviation. No thyromegaly.  CVS: RRR, S1/S2 +, no murmurs, no gallops, no carotid bruit.  Pulmonary: Effort and breath sounds normal, no stridor, rhonchi, wheezes, rales.  Abdominal: Soft. BS +,  no distension, tenderness, rebound or guarding.  Musculoskeletal: Normal range of motion. No edema and no tenderness.  Lymphadenopathy: No lymphadenopathy noted, cervical, inguinal. Neuro: Alert. Normal reflexes, muscle tone coordination. No focal neurologic deficits. Skin: Skin is warm and dry. No rash noted. Not diaphoretic. No erythema. No pallor.  Psychiatric: Normal mood and affect. Behavior, judgment, thought content normal.   Labs on Admission:  Basic Metabolic Panel:  Recent Labs Lab 11/27/13 1215  NA 134*  K 4.4  CL 109  CO2 11*  GLUCOSE 113*  BUN 83*  CREATININE 3.42*  CALCIUM 11.8*   Liver Function Tests:  Recent Labs Lab 11/27/13 1215  AST 12  ALT 10  ALKPHOS 57  BILITOT 0.2*  PROT 7.8  ALBUMIN 3.6   No results found for this basename: LIPASE, AMYLASE,  in the last 168 hours No results found for this basename: AMMONIA,  in the last 168 hours CBC:  Recent Labs Lab 11/27/13 1215  WBC 9.2  NEUTROABS 6.6  HGB 13.9  HCT 41.5  MCV 83.0  PLT 274   Cardiac Enzymes: No results found for this basename: CKTOTAL, CKMB, CKMBINDEX, TROPONINI,  in the last 168  hours BNP: No components found with this basename: POCBNP,  CBG: No results found for this basename: GLUCAP,  in the last 168 hours  If 7PM-7AM, please contact night-coverage [email or web address] Password [facility] 11/27/2013, 3:41 PM

## 2013-11-27 NOTE — Progress Notes (Signed)
NURSING PROGRESS NOTE  SANI GOLDSBERRY [id number] Admission Data: 11/27/2013 6:42 PM Attending Provider: Robbie Lis, MD [id number], TAMMY, MD Code Status: FULL  [name] [name] is a 53 y.o. male patient admitted from ED:  -No acute distress noted.  -No complaints of shortness of breath.  -No complaints of chest pain.   Cardiac Monitoring: Box # 19 in place. Cardiac monitor yields:normal sinus rhythm.  Blood pressure 112/86, pulse 76, temperature 97.9 F (36.6 C), temperature source Oral, resp. rate 16, height 5\' 10"  (1.778 m), weight 74.163 kg (163 lb 8 oz), SpO2 100.00%.   IV Fluids:  IV in place, occlusive dsg intact without redness, IV cath antecubital right, condition patent and no redness normal saline.   Allergies:  Lisinopril  Past Medical History:   has a past medical history of Gout; Frequency of urination; Urgency incontinence; Nocturia; Diabetes mellitus type 2, diet-controlled (PT STOPPED TAKING METFORMIN--  HAS BEEN WATCHING DIET, EXERCISING AND LOSING WT); Bladder cancer (2013); Complication of anesthesia; Hypertension; Anxiety; Depression; and GERD (gastroesophageal reflux disease).  Past Surgical History:   has past surgical history that includes ulner artery repair (Right, 2009); Transurethral resection of bladder tumor (12/15/2011); Lumbar laminectomy (06-02-2005); Transurethral resection of bladder tumor (01/24/2012); Cystoscopy with urethral dilatation (01/24/2012); Transurethral resection of bladder tumor (N/A, 07/10/2012); Cystoscopy w/ retrogrades (Bilateral, 07/10/2012); and Back surgery.  Social History:   reports that he quit smoking about 21 years ago. His smoking use included Cigarettes. He has a 16 pack-year smoking history. He quit smokeless tobacco use about 21 years ago. His smokeless tobacco use included Chew. He reports that he does not drink alcohol or use illicit drugs.  Skin: rash noted to upper chest and back. Patient and patient's wife states  "might be allergic reaction to medication injection" urostomy drain noted to right flank. skin otherwise intact.   Patient/Family orientated to room. Information packet given to patient/family. Admission inpatient armband information verified with patient/family to include name and date of birth and placed on patient arm. Side rails up x 2, fall assessment and education completed with patient/family. Patient/family able to verbalize understanding of risk associated with falls and verbalized understanding to call for assistance before getting out of bed. Call light within reach. Patient/family able to voice and demonstrate understanding of unit orientation instructions.    Will continue to evaluate and treat per MD orders.  Wallie Renshaw, RN

## 2013-11-27 NOTE — Progress Notes (Signed)
Report received from ED RN [place] for patient to be admitted into [id number]

## 2013-11-27 NOTE — ED Notes (Signed)
Patient reports that he has not been as active and "food doesn't taste good so i dont eat much" since coming home from the hospital. States he drinks 4-5 tall glasses of liquid daily. States he usually empties about 1400-1800 cc urine daily. He has a nephrostomy tube hooked to a drainage bag. He denies fever, diarrhea. States he did have emesis 1 time yesterday. States he has a lot of acid and takes prilosec at home. He does not report a relationship with a gi doctor. Pt mucous membranes are pink and moist. He does not report any dizziness

## 2013-11-27 NOTE — ED Notes (Signed)
Attempted to call report

## 2013-11-27 NOTE — ED Notes (Signed)
Pt was seen here on 7/28 and admitted for metabolic acidosis. Pt reports still not feeling well, having fatigue, not eating or drinking. No acute distress noted at triage.

## 2013-11-28 ENCOUNTER — Encounter (HOSPITAL_COMMUNITY): Payer: Self-pay | Admitting: General Practice

## 2013-11-28 ENCOUNTER — Inpatient Hospital Stay (HOSPITAL_COMMUNITY): Payer: BC Managed Care – PPO

## 2013-11-28 DIAGNOSIS — N390 Urinary tract infection, site not specified: Secondary | ICD-10-CM

## 2013-11-28 DIAGNOSIS — E872 Acidosis, unspecified: Secondary | ICD-10-CM

## 2013-11-28 LAB — BASIC METABOLIC PANEL
ANION GAP: 14 (ref 5–15)
BUN: 77 mg/dL — ABNORMAL HIGH (ref 6–23)
CHLORIDE: 112 meq/L (ref 96–112)
CO2: 11 meq/L — AB (ref 19–32)
CREATININE: 3.19 mg/dL — AB (ref 0.50–1.35)
Calcium: 9.7 mg/dL (ref 8.4–10.5)
GFR calc non Af Amer: 20 mL/min — ABNORMAL LOW (ref >90)
GFR, EST AFRICAN AMERICAN: 24 mL/min — AB (ref >90)
Glucose, Bld: 129 mg/dL — ABNORMAL HIGH (ref 70–99)
Potassium: 3.3 mEq/L — ABNORMAL LOW (ref 3.7–5.3)
SODIUM: 137 meq/L (ref 137–147)

## 2013-11-28 LAB — COMPREHENSIVE METABOLIC PANEL
ALT: 7 U/L (ref 0–53)
AST: 9 U/L (ref 0–37)
Albumin: 2.9 g/dL — ABNORMAL LOW (ref 3.5–5.2)
Alkaline Phosphatase: 43 U/L (ref 39–117)
Anion gap: 11 (ref 5–15)
BUN: 83 mg/dL — AB (ref 6–23)
CHLORIDE: 115 meq/L — AB (ref 96–112)
CO2: 11 mEq/L — ABNORMAL LOW (ref 19–32)
Calcium: 10.4 mg/dL (ref 8.4–10.5)
Creatinine, Ser: 3.31 mg/dL — ABNORMAL HIGH (ref 0.50–1.35)
GFR calc non Af Amer: 19 mL/min — ABNORMAL LOW (ref >90)
GFR, EST AFRICAN AMERICAN: 23 mL/min — AB (ref >90)
GLUCOSE: 83 mg/dL (ref 70–99)
POTASSIUM: 4.1 meq/L (ref 3.7–5.3)
Sodium: 137 mEq/L (ref 137–147)
TOTAL PROTEIN: 6.3 g/dL (ref 6.0–8.3)

## 2013-11-28 LAB — CBC
HCT: 33.2 % — ABNORMAL LOW (ref 39.0–52.0)
Hemoglobin: 11.1 g/dL — ABNORMAL LOW (ref 13.0–17.0)
MCH: 27.2 pg (ref 26.0–34.0)
MCHC: 33.4 g/dL (ref 30.0–36.0)
MCV: 81.4 fL (ref 78.0–100.0)
Platelets: 215 10*3/uL (ref 150–400)
RBC: 4.08 MIL/uL — ABNORMAL LOW (ref 4.22–5.81)
RDW: 17.3 % — ABNORMAL HIGH (ref 11.5–15.5)
WBC: 7.5 10*3/uL (ref 4.0–10.5)

## 2013-11-28 LAB — GLUCOSE, CAPILLARY: GLUCOSE-CAPILLARY: 97 mg/dL (ref 70–99)

## 2013-11-28 MED ORDER — SODIUM BICARBONATE 8.4 % IV SOLN
INTRAVENOUS | Status: DC
  Administered 2013-11-28 – 2013-11-30 (×7): via INTRAVENOUS
  Filled 2013-11-28 (×8): qty 100

## 2013-11-28 MED ORDER — SODIUM BICARBONATE 8.4 % IV SOLN: INTRAVENOUS | Status: DC

## 2013-11-28 MED ORDER — HEPARIN SODIUM (PORCINE) 5000 UNIT/ML IJ SOLN
5000.0000 [IU] | Freq: Three times a day (TID) | INTRAMUSCULAR | Status: DC
  Administered 2013-11-28 – 2013-12-01 (×9): 5000 [IU] via SUBCUTANEOUS
  Filled 2013-11-28 (×14): qty 1

## 2013-11-28 MED ORDER — SENNOSIDES-DOCUSATE SODIUM 8.6-50 MG PO TABS
1.0000 | ORAL_TABLET | Freq: Once | ORAL | Status: AC
  Administered 2013-11-28: 1 via ORAL
  Filled 2013-11-28: qty 1

## 2013-11-28 MED ORDER — POLYETHYLENE GLYCOL 3350 17 G PO PACK
17.0000 g | PACK | Freq: Every day | ORAL | Status: DC
  Administered 2013-11-29: 17 g via ORAL
  Filled 2013-11-28 (×4): qty 1

## 2013-11-28 MED ORDER — LINEZOLID 600 MG PO TABS
600.0000 mg | ORAL_TABLET | Freq: Two times a day (BID) | ORAL | Status: DC
  Administered 2013-11-28 – 2013-12-01 (×7): 600 mg via ORAL
  Filled 2013-11-28 (×8): qty 1

## 2013-11-28 NOTE — ED Provider Notes (Signed)
Medical screening examination/treatment/procedure(s) were performed by non-physician practitioner and as supervising physician I was immediately available for consultation/collaboration.   EKG Interpretation None       Ephraim Hamburger, MD 11/28/13 [phone]

## 2013-11-28 NOTE — Progress Notes (Signed)
[facility] PROGRESS NOTE  [name] [name] [id number] DOB: [birth year] DOA: 11/27/2013 PCP: Florina Ou, MD  Assessment/Plan:  Acute renal failure on chronic kidney disease stage III  -Baseline Cr  around 2, (8/26) 3.31 -Likely due to UTI, poor oral intake. -Appreciate Nephro consult- No current need for dialysis, continue IV fluids -will check renal ultrasound to assess for hydronephroses  Metabolic acidosis -suspect secondary to ARF/Neobladder -started on IV bicarb  UTI with Pyelonephritis Last admission- Enterococcus. He was discharged on Amoxacillin not sensitive to Ampicillin. Discussed case with Dr Comer-recommended Zyvox (8/26)  For 2 weeks. - better coverage for enterococcus. Discontinue Levaquin. Remains afebrile and non toxic appearing.  -urine culture pending  History of Bladder cancer and Right Nephroureteral Stent and Nephrostomy tube -Monitor urine output closely-nephrostomy tube currently draining well-have asked RN to flush daily.  Anemia of chronic kidney disease Hgb 11( 8/26) Continue to monitor outpatient follow up   Hypercalcemia  Resolved  Dehydration likely cause   GERD Continue home medication Pantoprazole>>   Generalized Weakness/Fatigue  PT evaluation ordered   History of anxiety and depression:  -Stable. Will stop Celexa as Zyvox being started.  DVT prophylaxis-SQ Heparin Code Status: Full Family Communication: No family at bedside Disposition Plan: Home when medically able   Consultants:  None  Procedures:  None  Antibiotics:  Levofloxacin d/c 8/26  Zyvox started 8/26>>    HPI 53 yo male with PMH of bladder cancer s/p neobladder and right uretral stent and currently with nephrostomy tube, recent admission for UTI and GERD,  presented to [facility] ED 11/27/2013 with progressive weakness, decreased appetite for past few days, and occasional dysuria.  He denies fevers, chills, hematuria, or flank pain.  He denies shortness of  breath, chest pain, abdominal pain, nausea, or vomiting.   He was in the hospital [id number] for metabolic acidosis, UTI, and AKI. In the ED, he is found to have same problems as in the previous admission. He was treated outpatient with Levofloxacin empirically for recurrent UTI, and during the ED course this was changed to every other day. Creatinine on admission 3.42.  Subjective:  Patient is sitting in bed and complains of not feeling good, as he feels weak and fatigued.   Has not had a BM and feels constipated Objective: Filed Vitals:   11/28/13 0508  BP: 102/58  Pulse: 79  Temp: 98.6 F (37 C)  Resp: 16    Intake/Output Summary (Last 24 hours) at 11/28/13 1356 Last data filed at 11/28/13 0900  Gross per 24 hour  Intake    120 ml  Output      0 ml  Net    120 ml   Filed Weights   11/27/13 1641 11/28/13 0508  Weight: 74.163 kg (163 lb 8 oz) 75 kg (165 lb 5.5 oz)    Exam:  Gen: Alert and oriented, in no acute distress  HEENT: Normocephalic, atraumatic.  Pupils symmertrical.  Moist mucosa.   Chest: clear to auscultate bilaterally, no ronchi or rales  Cardiac: Regular rate and rhythm, S1-S2, no rubs murmurs or gallops  Abdomen: soft, non tender, non distended, +bowel sounds. No guarding or rigidity  Genitourinary:  Urostomy bag at the right flank, no evidence of infection or blockage Extremities: Symmetrical in appearance without cyanosis or edema  Neurological: Alert awake oriented to time place and person.  Skin: rash noted on trunk and anterior of arms bilaterally Psychiatric: Appears normal.   Data Reviewed: Basic Metabolic Panel:  Recent Labs Lab 11/27/13  1215 11/27/13 1829 11/28/13 0721  NA 134* 137 137  K 4.4 4.3 4.1  CL 109 112 115*  CO2 11* 11* 11*  GLUCOSE 113* 92 83  BUN 83* 82* 83*  CREATININE 3.42* 3.25* 3.31*  CALCIUM 11.8* 11.1* 10.4  MG  --  2.2  --   PHOS  --  3.3  --    Liver Function Tests:  Recent Labs Lab 11/27/13 1215 11/27/13 1829  11/28/13 0721  AST 12 9 9   ALT 10 8 7   ALKPHOS 57 51 43  BILITOT 0.2* 0.2* <0.2*  PROT 7.8 7.1 6.3  ALBUMIN 3.6 3.4* 2.9*   No results found for this basename: LIPASE, AMYLASE,  in the last 168 hours No results found for this basename: AMMONIA,  in the last 168 hours CBC:  Recent Labs Lab 11/27/13 1215 11/27/13 1829 11/28/13 0721  WBC 9.2 9.9 7.5  NEUTROABS 6.6 6.9  --   HGB 13.9 12.8* 11.1*  HCT 41.5 37.4* 33.2*  MCV 83.0 81.3 81.4  PLT 274 243 215   Cardiac Enzymes: No results found for this basename: CKTOTAL, CKMB, CKMBINDEX, TROPONINI,  in the last 168 hours BNP (last 3 results)  Recent Labs  10/30/13 1919  PROBNP 96.4   CBG:  Recent Labs Lab 11/27/13 1659 11/28/13 0757  GLUCAP 71 97    No results found for this or any previous visit (from the past 240 hour(s)).   Studies: No results found.  Scheduled Meds: . citric acid-sodium citrate  15 mL Oral TID PC & HS  . linezolid  600 mg Oral Q12H  . pantoprazole  40 mg Oral Daily  . sodium chloride  3 mL Intravenous Q12H   Continuous Infusions: .  sodium bicarbonate  infusion 1000 mL 75 mL/hr at 11/28/13 1222    Principal Problem:   AKI (acute kidney injury) Active Problems:   Bladder cancer   UTI (lower urinary tract infection)   Hypercalcemia    Time spent: 11  Tana Felts  [facility] Pager [phone]. If 7PM-7AM, please contact night-coverage at [email or web address], password [facility] 11/28/2013, 1:56 PM  LOS: 1 day    Attending Patient was seen, examined,treatment plan was discussed with the Physician extender. I have directly reviewed the clinical findings, lab, imaging studies and management of this patient in detail. I have made the necessary changes to the above noted documentation, and agree with the documentation, as recorded by the Physician extender.  Nena Alexander MD [facility].

## 2013-11-28 NOTE — Consult Note (Signed)
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 Recently, has also had a right nephrostomy tube placement after  dysfunctional uteretral stent. Brought to the emergency room by his wife with increasing lethargy/excessive somnolence with poor appetite and intermittent nausea with dysgeusia. In the past, she noted that these have been indicators for significant urinary tract infection. Surprisingly, the patient was on levofloxacin 750 mg daily when he developed these symptoms.  Concern is raised because his creatinine is elevated (from 2.4 3 weeks ago now to 3.4) and he has profound metabolic acidosis (not improved by normal saline overnight). Urine cultures are pending and he is empirically on oral levofloxacin-his last urine culture was significant for enterococcus resistant to levofloxacin/ampicillin/tetracycline but sensitive to vancomycin.  Past Medical History  Diagnosis Date  . Gout     recent flair -bilateral feet--  STABLE  . Frequency of urination   . Urgency incontinence   . Nocturia   . Diabetes mellitus type 2, diet-controlled PT STOPPED TAKING METFORMIN--  HAS BEEN WATCHING DIET, EXERCISING AND LOSING WT  . Bladder cancer 2013  . Complication of anesthesia     agitation w/awakening in 9/13,OK 01/24/12  . Hypertension   . Anxiety   . Depression   . GERD (gastroesophageal reflux disease)   . Acute renal injury 11/2013    Past Surgical History  Procedure Laterality Date  . [place] artery repair Right 2009    rt -trauma /thrombectomy,repair  . Transurethral resection of bladder tumor  12/15/2011    Procedure:  TRANSURETHRAL RESECTION OF BLADDER TUMOR (TURBT);  Surgeon: Molli Hazard, MD;  Location: [facility];  Service: Urology;  Laterality: N/A;  2 HRS   . Lumbar laminectomy  06-02-2005    W/ RESECTION NERVE ROOT  L4  -  L5  . Transurethral resection of bladder tumor  01/24/2012    Procedure: TRANSURETHRAL RESECTION OF BLADDER TUMOR (TURBT);  Surgeon: Molli Hazard, MD;  Location: [facility];  Service: Urology;  Laterality: N/A;  90 MIN   . Cystoscopy with urethral dilatation  01/24/2012    Procedure: CYSTOSCOPY WITH URETHRAL DILATATION;  Surgeon: Molli Hazard, MD;  Location: [facility];  Service: Urology;  Laterality: N/A;  . Transurethral resection of bladder tumor N/A 07/10/2012    Procedure: TRANSURETHRAL RESECTION OF BLADDER TUMOR (TURBT);  Surgeon: Molli Hazard, MD;  Location: [facility];  Service: Urology;  Laterality: N/A;  . Cystoscopy w/ retrogrades Bilateral 07/10/2012    Procedure: CYSTOSCOPY WITH RETROGRADE PYELOGRAM;  Surgeon: Molli Hazard, MD;  Location: [facility];  Service: Urology;  Laterality: Bilateral;  . Back surgery    . Bladder surgery  04/26/2013    at the [facility] [place] in  [place]      Family History  Problem Relation Age of Onset  . Diabetes Mellitus II Other   . Hypertension Other   . Bladder Cancer Neg Hx     Social History:  reports that he quit smoking about 21 years ago. His smoking use included Cigarettes. He has a 16 pack-year smoking history. He quit smokeless tobacco use about 21 years ago. His smokeless tobacco  use included Chew. He reports that he does not drink alcohol or use illicit drugs.  Allergies:  Allergies  Allergen Reactions  . Lisinopril Cough    Medications:  Scheduled: . citalopram  40 mg Oral Daily  . citric acid-sodium citrate  15 mL Oral TID PC & HS  . levofloxacin  750 mg Oral Q48H  .  pantoprazole  40 mg Oral Daily  . sodium chloride  3 mL Intravenous Q12H    Results for orders placed during the hospital encounter of 11/27/13 (from the past 48 hour(s))  CBC WITH DIFFERENTIAL     Status: Abnormal   Collection Time    11/27/13 12:15 PM      Result Value Ref Range   WBC 9.2  4.0 - 10.5 K/uL   RBC 5.00  4.22 - 5.81 MIL/uL   Hemoglobin 13.9  13.0 - 17.0 g/dL   HCT 84.1  33.1 - 34.3 %   MCV 83.0  78.0 - 100.0 fL   MCH 27.8  26.0 - 34.0 pg   MCHC 33.5  30.0 - 36.0 g/dL   RDW 88.1 (*) 79.1 - 05.8 %   Platelets 274  150 - 400 K/uL   Neutrophils Relative % 71  43 - 77 %   Neutro Abs 6.6  1.7 - 7.7 K/uL   Lymphocytes Relative 22  12 - 46 %   Lymphs Abs 2.0  0.7 - 4.0 K/uL   Monocytes Relative 6  3 - 12 %   Monocytes Absolute 0.5  0.1 - 1.0 K/uL   Eosinophils Relative 1  0 - 5 %   Eosinophils Absolute 0.1  0.0 - 0.7 K/uL   Basophils Relative 0  0 - 1 %   Basophils Absolute 0.0  0.0 - 0.1 K/uL  COMPREHENSIVE METABOLIC PANEL     Status: Abnormal   Collection Time    11/27/13 12:15 PM      Result Value Ref Range   Sodium 134 (*) 137 - 147 mEq/L   Potassium 4.4  3.7 - 5.3 mEq/L   Chloride 109  96 - 112 mEq/L   CO2 11 (*) 19 - 32 mEq/L   Glucose, Bld 113 (*) 70 - 99 mg/dL   BUN 83 (*) 6 - 23 mg/dL   Creatinine, Ser 6.10 (*) 0.50 - 1.35 mg/dL   Calcium 04.2 (*) 8.4 - 10.5 mg/dL   Total Protein 7.8  6.0 - 8.3 g/dL   Albumin 3.6  3.5 - 5.2 g/dL   AST 12  0 - 37 U/L   ALT 10  0 - 53 U/L   Alkaline Phosphatase 57  39 - 117 U/L   Total Bilirubin 0.2 (*) 0.3 - 1.2 mg/dL   GFR calc non Af Amer 19 (*) >90 mL/min   GFR calc Af Amer 22 (*) >90 mL/min   Comment: (NOTE)     The eGFR has been calculated using the CKD EPI equation.     This calculation has not been validated in all clinical situations.     eGFR's persistently <90 mL/min signify possible Chronic Kidney     Disease.   Anion gap 14  5 - 15  URINALYSIS, ROUTINE W REFLEX MICROSCOPIC     Status: Abnormal    Collection Time    11/27/13 12:51 PM      Result Value Ref Range   Color, Urine YELLOW  YELLOW   APPearance CLOUDY (*) CLEAR   Specific Gravity, Urine 1.014  1.005 -  1.030   pH 7.0  5.0 - 8.0   Glucose, UA NEGATIVE  NEGATIVE mg/dL   Hgb urine dipstick MODERATE (*) NEGATIVE   Bilirubin Urine NEGATIVE  NEGATIVE   Ketones, ur NEGATIVE  NEGATIVE mg/dL   Protein, ur 100 (*) NEGATIVE mg/dL   Urobilinogen, UA 0.2  0.0 - 1.0 mg/dL   Nitrite NEGATIVE  NEGATIVE   Leukocytes, UA LARGE (*) NEGATIVE  URINE MICROSCOPIC-ADD ON     Status: Abnormal   Collection Time    11/27/13 12:51 PM      Result Value Ref Range   Squamous Epithelial / LPF RARE  RARE   WBC, UA TOO NUMEROUS TO COUNT  <3 WBC/hpf   RBC / HPF 3-6  <3 RBC/hpf   Bacteria, UA MANY (*) RARE  I-STAT VENOUS BLOOD GAS, ED     Status: Abnormal   Collection Time    11/27/13  3:04 PM      Result Value Ref Range   pH, Ven 7.147 (*) 7.250 - 7.300   pCO2, Ven 31.4 (*) 45.0 - 50.0 mmHg   pO2, Ven 39.0  30.0 - 45.0 mmHg   Bicarbonate 10.9 (*) 20.0 - 24.0 mEq/L   TCO2 12  0 - 100 mmol/L   O2 Saturation 59.0     Acid-base deficit 17.0 (*) 0.0 - 2.0 mmol/L   Sample type VENOUS     Comment NOTIFIED PHYSICIAN    GLUCOSE, CAPILLARY     Status: None   Collection Time    11/27/13  4:59 PM      Result Value Ref Range   Glucose-Capillary 71  70 - 99 mg/dL  COMPREHENSIVE METABOLIC PANEL     Status: Abnormal   Collection Time    11/27/13  6:29 PM      Result Value Ref Range   Sodium 137  137 - 147 mEq/L   Potassium 4.3  3.7 - 5.3 mEq/L   Chloride 112  96 - 112 mEq/L   CO2 11 (*) 19 - 32 mEq/L   Glucose, Bld 92  70 - 99 mg/dL   BUN 82 (*) 6 - 23 mg/dL   Creatinine, Ser 3.25 (*) 0.50 - 1.35 mg/dL   Calcium 11.1 (*) 8.4 - 10.5 mg/dL   Total Protein 7.1  6.0 - 8.3 g/dL   Albumin 3.4 (*) 3.5 - 5.2 g/dL   AST 9  0 - 37 U/L   ALT 8  0 - 53 U/L   Alkaline Phosphatase 51  39 - 117 U/L   Total Bilirubin 0.2 (*) 0.3 - 1.2 mg/dL   GFR calc non Af  Amer 20 (*) >90 mL/min   GFR calc Af Amer 23 (*) >90 mL/min   Comment: (NOTE)     The eGFR has been calculated using the CKD EPI equation.     This calculation has not been validated in all clinical situations.     eGFR's persistently <90 mL/min signify possible Chronic Kidney     Disease.   Anion gap 14  5 - 15  MAGNESIUM     Status: None   Collection Time    11/27/13  6:29 PM      Result Value Ref Range   Magnesium 2.2  1.5 - 2.5 mg/dL  PHOSPHORUS     Status: None   Collection Time    11/27/13  6:29 PM      Result Value Ref Range   Phosphorus 3.3  2.3 - 4.6 mg/dL  CBC WITH DIFFERENTIAL     Status: Abnormal   Collection Time    11/27/13  6:29 PM      Result Value Ref Range   WBC 9.9  4.0 - 10.5 K/uL   RBC 4.60  4.22 - 5.81 MIL/uL   Hemoglobin 12.8 (*) 13.0 - 17.0 g/dL   HCT 37.4 (*) 39.0 - 52.0 %   MCV 81.3  78.0 - 100.0 fL   MCH 27.8  26.0 - 34.0 pg   MCHC 34.2  30.0 - 36.0 g/dL   RDW 17.0 (*) 11.5 - 15.5 %   Platelets 243  150 - 400 K/uL   Neutrophils Relative % 68  43 - 77 %   Neutro Abs 6.9  1.7 - 7.7 K/uL   Lymphocytes Relative 25  12 - 46 %   Lymphs Abs 2.5  0.7 - 4.0 K/uL   Monocytes Relative 5  3 - 12 %   Monocytes Absolute 0.5  0.1 - 1.0 K/uL   Eosinophils Relative 2  0 - 5 %   Eosinophils Absolute 0.2  0.0 - 0.7 K/uL   Basophils Relative 0  0 - 1 %   Basophils Absolute 0.0  0.0 - 0.1 K/uL  APTT     Status: None   Collection Time    11/27/13  6:29 PM      Result Value Ref Range   aPTT 31  24 - 37 seconds  TSH     Status: None   Collection Time    11/27/13  6:29 PM      Result Value Ref Range   TSH 2.360  0.350 - 4.500 uIU/mL  PROTIME-INR     Status: None   Collection Time    11/27/13  6:29 PM      Result Value Ref Range   Prothrombin Time 15.0  11.6 - 15.2 seconds   INR 1.18  0.00 - 1.49  COMPREHENSIVE METABOLIC PANEL     Status: Abnormal   Collection Time    11/28/13  7:21 AM      Result Value Ref Range   Sodium 137  137 - 147 mEq/L   Potassium  4.1  3.7 - 5.3 mEq/L   Chloride 115 (*) 96 - 112 mEq/L   CO2 11 (*) 19 - 32 mEq/L   Glucose, Bld 83  70 - 99 mg/dL   BUN 83 (*) 6 - 23 mg/dL   Creatinine, Ser 3.31 (*) 0.50 - 1.35 mg/dL   Calcium 10.4  8.4 - 10.5 mg/dL   Total Protein 6.3  6.0 - 8.3 g/dL   Albumin 2.9 (*) 3.5 - 5.2 g/dL   AST 9  0 - 37 U/L   ALT 7  0 - 53 U/L   Alkaline Phosphatase 43  39 - 117 U/L   Total Bilirubin <0.2 (*) 0.3 - 1.2 mg/dL   GFR calc non Af Amer 19 (*) >90 mL/min   GFR calc Af Amer 23 (*) >90 mL/min   Comment: (NOTE)     The eGFR has been calculated using the CKD EPI equation.     This calculation has not been validated in all clinical situations.     eGFR's persistently <90 mL/min signify possible Chronic Kidney     Disease.   Anion gap 11  5 - 15  CBC     Status: Abnormal   Collection Time    11/28/13  7:21 AM      Result Value Ref Range  WBC 7.5  4.0 - 10.5 K/uL   RBC 4.08 (*) 4.22 - 5.81 MIL/uL   Hemoglobin 11.1 (*) 13.0 - 17.0 g/dL   HCT 33.2 (*) 39.0 - 52.0 %   MCV 81.4  78.0 - 100.0 fL   MCH 27.2  26.0 - 34.0 pg   MCHC 33.4  30.0 - 36.0 g/dL   RDW 17.3 (*) 11.5 - 15.5 %   Platelets 215  150 - 400 K/uL  GLUCOSE, CAPILLARY     Status: None   Collection Time    11/28/13  7:57 AM      Result Value Ref Range   Glucose-Capillary 97  70 - 99 mg/dL    No results found.  Review of Systems  Constitutional: Positive for malaise/fatigue. Negative for fever and chills.  HENT: Negative for hearing loss, nosebleeds, sore throat and tinnitus.   Eyes: Negative.   Respiratory: Negative.   Cardiovascular: Negative.   Gastrointestinal: Positive for nausea. Negative for vomiting, abdominal pain and diarrhea.       Poor appetite and dysgeusia  Musculoskeletal: Negative.   Skin: Negative for itching and rash.  Neurological: Positive for weakness and headaches. Negative for dizziness, tremors and focal weakness.  Psychiatric/Behavioral: The patient is nervous/anxious.    Blood pressure  102/58, pulse 79, temperature 98.6 F (37 C), temperature source Oral, resp. rate 16, height $RemoveBe'5\' 10"'[id number]$  (1.778 m), weight 75 kg (165 lb 5.5 oz), SpO2 99.00%. Physical Exam  Nursing note and vitals reviewed. Constitutional: He is oriented to person, place, and time. He appears well-developed and well-nourished. No distress.  HENT:  Head: Normocephalic and atraumatic.  Nose: Nose normal.  Mouth/Throat: No oropharyngeal exudate.  Eyes: Conjunctivae and EOM are normal. Pupils are equal, round, and reactive to light. No scleral icterus.  Neck: Normal range of motion. Neck supple. No JVD present. No tracheal deviation present. No thyromegaly present.  Cardiovascular: Normal rate, regular rhythm and normal heart sounds.  Exam reveals no friction rub.   No murmur heard. Respiratory: Effort normal and breath sounds normal. No respiratory distress. He has no wheezes. He has no rales.  GI: Soft. Bowel sounds are normal. He exhibits no distension. There is no tenderness. There is no rebound.  Musculoskeletal: Normal range of motion. He exhibits no edema and no tenderness.  Neurological: He is alert and oriented to person, place, and time.  Skin: Skin is warm and dry. No rash noted. No erythema.  Psychiatric: He has a normal mood and affect. His behavior is normal.    Assessment/Plan: 1. Acute renal failure on chronic kidney disease stage III-4: Appears to be associated with this current urinary tract infection/poor oral intake preceding hospitalization probably with prerenal evolving into ATN. We'll attempt intravenous fluids in order to try and volume resuscitate him as well as correct his metabolic acidosis. Anticipate improving renal function will resolve his nausea and improve his oral intake. No acute dialysis needs noted at this time and the patient is aware of the gravity of the situation. 2. Metabolic acidosis: Surprisingly this appears to be primarily non-anion gap metabolic acidosis mixed with anion  Metabolic acidosis (the latter from acute renal failure and the former from changes with bicarbonate handling with renal tubular acidosis/neobladder). Will adjust intravenous sodium bicarbonate dose to help alleviate his metabolic acidosis. 3. Urinary tract infection/pyelonephritis: Initially hypotensive and with altered mental status indicated of sepsis, on empiric levofloxacin intravenously as we await urine cultures-may need adjustment of antibiotic therapy 4. Anemia of chronic kidney disease:  Hemoglobin greater than 12, continue to monitor (suspected this might be concentrated)  Aidynn Polendo K. 11/28/2013, 12:37 PM

## 2013-11-29 ENCOUNTER — Inpatient Hospital Stay (HOSPITAL_COMMUNITY): Payer: BC Managed Care – PPO

## 2013-11-29 DIAGNOSIS — E46 Unspecified protein-calorie malnutrition: Secondary | ICD-10-CM | POA: Insufficient documentation

## 2013-11-29 LAB — RENAL FUNCTION PANEL
ALBUMIN: 2.8 g/dL — AB (ref 3.5–5.2)
Anion gap: 15 (ref 5–15)
BUN: 73 mg/dL — ABNORMAL HIGH (ref 6–23)
CALCIUM: 9.2 mg/dL (ref 8.4–10.5)
CHLORIDE: 107 meq/L (ref 96–112)
CO2: 15 meq/L — AB (ref 19–32)
CREATININE: 2.92 mg/dL — AB (ref 0.50–1.35)
GFR calc Af Amer: 26 mL/min — ABNORMAL LOW (ref >90)
GFR calc non Af Amer: 23 mL/min — ABNORMAL LOW (ref >90)
Glucose, Bld: 109 mg/dL — ABNORMAL HIGH (ref 70–99)
Phosphorus: 2.9 mg/dL (ref 2.3–4.6)
Potassium: 3 mEq/L — ABNORMAL LOW (ref 3.7–5.3)
Sodium: 137 mEq/L (ref 137–147)

## 2013-11-29 LAB — MAGNESIUM: MAGNESIUM: 2 mg/dL (ref 1.5–2.5)

## 2013-11-29 LAB — GLUCOSE, CAPILLARY: Glucose-Capillary: 107 mg/dL — ABNORMAL HIGH (ref 70–99)

## 2013-11-29 MED ORDER — NEPRO/CARBSTEADY PO LIQD
237.0000 mL | Freq: Three times a day (TID) | ORAL | Status: DC
  Administered 2013-11-29 – 2013-12-01 (×6): 237 mL via ORAL

## 2013-11-29 MED ORDER — CITRIC ACID-SODIUM CITRATE 334-500 MG/5ML PO SOLN
15.0000 mL | Freq: Three times a day (TID) | ORAL | Status: DC
  Administered 2013-11-30 – 2013-12-01 (×6): 15 mL via ORAL
  Filled 2013-11-29 (×9): qty 15

## 2013-11-29 MED ORDER — IOHEXOL 300 MG/ML  SOLN
50.0000 mL | Freq: Once | INTRAMUSCULAR | Status: AC | PRN
  Administered 2013-11-29: 25 mL via INTRAVENOUS

## 2013-11-29 MED ORDER — POTASSIUM CHLORIDE CRYS ER 20 MEQ PO TBCR
40.0000 meq | EXTENDED_RELEASE_TABLET | Freq: Two times a day (BID) | ORAL | Status: AC
  Administered 2013-11-29 (×2): 40 meq via ORAL
  Filled 2013-11-29 (×2): qty 2

## 2013-11-29 NOTE — Progress Notes (Signed)
INITIAL NUTRITION ASSESSMENT  DOCUMENTATION CODES Per approved criteria  -Severe malnutrition in the context of chronic illness  Pt meets criteria for severe MALNUTRITION in the context of chronic illness as evidenced by severe fat and muscle depletion, poor PO intake of <75% and weight loss of 30% x 7 months.  INTERVENTION:  Provide Nepro Shake po TID, each supplement provides 425 kcal and 19 grams protein  Encourage PO intake  Will continue to monitor  NUTRITION DIAGNOSIS: Inadequate oral intake related to decreased appetite, N/V as evidenced by poor PO intake <25% of meals and weight loss of 30% x 7 months.   Goal: Pt to meet >/= 90% of their estimated nutrition needs   Monitor:  PO and supplemental intake, weight, labs, I/O's   Reason for Assessment: Pt identified as at nutrition risk on the Malnutrition Screen Tool, Score 5   Admitting Dx: AKI (acute kidney injury) on CKD stage III  ASSESSMENT: 53 year old male with past medical history of bladder cancer, has right nephroureteral stent, recent admission for UTI who presented to [facility] ED 11/27/2013 with progressive weakness, fatigue and poor po intake for past few days prior to this admission.   PO intake: 90%  Pt reports low appetite and wt loss of 72 lb since January ( 30% wt loss x 7 months), has had some taste changes where "nothing tastes the same anymore". States that he was having intermittent nausea/vomiting episodes PTA.   When asked about eating habits, pt states that he doesn't eat 3 meals a day and sometimes only has a pack of "nabs" a day or maybe a salad, which he sometimes throws back up. Pt reports not being able to tolerate a lot of food at one time. Pt's wife orders him supplements and cases of Nepro to drink at home. Will send him Nepro TID between meals.   Nutrition Focused Physical Exam:  Subcutaneous Fat:  Orbital Region: WNL Upper Arm Region: severe depletion Thoracic and Lumbar Region:  N/A  Muscle:  Temple Region: moderate depletion Clavicle Bone Region: mild depletion Clavicle and Acromion Bone Region: severe depletion Scapular Bone Region: moderate depletion Dorsal Hand: WNL Patellar Region: mild depletion Anterior Thigh Region: moderate depletion Posterior Calf Region: WNL  Edema: No LE edema   Labs reviewed:  Low potassium Glucose 109 High Creatinine & BUN GFR 26  Height: Ht Readings from Last 1 Encounters:  11/27/13 5\' 10"  (1.778 m)    Weight: Wt Readings from Last 1 Encounters:  11/29/13 165 lb 5.5 oz (75 kg)    Ideal Body Weight: 166 lb  % Ideal Body Weight: 99%  Wt Readings from Last 10 Encounters:  11/29/13 165 lb 5.5 oz (75 kg)  11/02/13 167 lb 8.8 oz (76 kg)  09/24/13 170 lb (77.111 kg)  08/29/13 173 lb (78.472 kg)  08/29/13 173 lb 12.8 oz (78.835 kg)  07/24/13 183 lb (83.008 kg)  06/25/13 180 lb 1.6 oz (81.693 kg)  05/30/13 197 lb (89.359 kg)  05/23/13 197 lb 1.5 oz (89.4 kg)  05/11/13 211 lb 3.2 oz (95.8 kg)    Usual Body Weight: 235 lb  % Usual Body Weight: 70%  BMI:  Body mass index is 23.72 kg/(m^2).  Estimated Nutritional Needs: Kcal: 1800-1900 Protein: 90-110 grams Fluid: >1.8 L/day   Skin: skin tear, rash on chest and back  Diet Order: General  EDUCATION NEEDS: -No education needs identified at this time   Intake/Output Summary (Last 24 hours) at 11/29/13 1218 Last data filed  at 11/29/13 1013  Gross per 24 hour  Intake 538.33 ml  Output   1425 ml  Net -886.67 ml     Last BM: 8/26   Labs:   Recent Labs Lab 11/27/13 1829 11/28/13 0721 11/28/13 1612 11/29/13 0659  NA 137 137 137 137  K 4.3 4.1 3.3* 3.0*  CL 112 115* 112 107  CO2 11* 11* 11* 15*  BUN 82* 83* 77* 73*  CREATININE 3.25* 3.31* 3.19* 2.92*  CALCIUM 11.1* 10.4 9.7 9.2  MG 2.2  --   --  2.0  PHOS 3.3  --   --  2.9  GLUCOSE 92 83 129* 109*    CBG (last 3)   Recent Labs  11/27/13 1659 11/28/13 0757 11/29/13 0749  GLUCAP  71 97 107*    Scheduled Meds: . citric acid-sodium citrate  15 mL Oral TID PC & HS  . heparin subcutaneous  5,000 Units Subcutaneous 3 times per day  . linezolid  600 mg Oral Q12H  . pantoprazole  40 mg Oral Daily  . polyethylene glycol  17 g Oral Daily  . potassium chloride  40 mEq Oral BID  . sodium chloride  3 mL Intravenous Q12H    Continuous Infusions: .  sodium bicarbonate  infusion 1000 mL 125 mL/hr at 11/29/13 [id number]    Past Medical History  Diagnosis Date  . Gout     recent flair -bilateral feet--  STABLE  . Frequency of urination   . Urgency incontinence   . Nocturia   . Diabetes mellitus type 2, diet-controlled PT STOPPED TAKING METFORMIN--  HAS BEEN WATCHING DIET, EXERCISING AND LOSING WT  . Bladder cancer 2013  . Complication of anesthesia     agitation w/awakening in 9/13,OK 01/24/12  . Hypertension   . Anxiety   . Depression   . GERD (gastroesophageal reflux disease)   . Acute renal injury 11/2013    Past Surgical History  Procedure Laterality Date  . Ulner artery repair Right 2009    rt -trauma /thrombectomy,repair  . Transurethral resection of bladder tumor  12/15/2011    Procedure: TRANSURETHRAL RESECTION OF BLADDER TUMOR (TURBT);  Surgeon: Molli Hazard, MD;  Location: [facility];  Service: Urology;  Laterality: N/A;  2 HRS   . Lumbar laminectomy  06-02-2005    W/ RESECTION NERVE ROOT  L4  -  L5  . Transurethral resection of bladder tumor  01/24/2012    Procedure: TRANSURETHRAL RESECTION OF BLADDER TUMOR (TURBT);  Surgeon: Molli Hazard, MD;  Location: [facility];  Service: Urology;  Laterality: N/A;  90 MIN   . Cystoscopy with urethral dilatation  01/24/2012    Procedure: CYSTOSCOPY WITH URETHRAL DILATATION;  Surgeon: Molli Hazard, MD;  Location: [facility];  Service: Urology;  Laterality: N/A;  . Transurethral resection of bladder tumor N/A 07/10/2012    Procedure: TRANSURETHRAL  RESECTION OF BLADDER TUMOR (TURBT);  Surgeon: Molli Hazard, MD;  Location: [facility];  Service: Urology;  Laterality: N/A;  . Cystoscopy w/ retrogrades Bilateral 07/10/2012    Procedure: CYSTOSCOPY WITH RETROGRADE PYELOGRAM;  Surgeon: Molli Hazard, MD;  Location: [facility];  Service: Urology;  Laterality: Bilateral;  . Back surgery    . Bladder surgery  04/26/2013    at the [facility] [place] in  [place]      Lindsey Baker, [place], [place] Licensed Dietitian Nutritionist Pager: [phone]  Note/chart reviewed. Agree  with note.   [place], [place], [place] Pager [phone] After Hours Pager

## 2013-11-29 NOTE — Progress Notes (Signed)
Patient ID: [name] [name], male   DOB: 03-08-1961, 53 y.o.   MRN: [id number]  [facility] Progress Note    Assessment/ Plan:   1. Acute renal failure on chronic kidney disease stage III-IV: renal function improving with good UOP on IVFs and ongoing antibiotic to treat UTI. Continue on IVFs for now at current rate. 2. Metabolic acidosis: slightly better on IVFs, anticipated to improve further with renal recovery .  3. Urinary tract infection/pyelonephritis:  Switched to Linezolid yesterday after noted that she history of Enterococcus resistant to levofloxacin 4. Anemia of chronic kidney disease: monitor Hgb trend, no ESA at this time 5. Hypokalemia: due to poor intake and shifting by sodium bicarbonate gtt, will replace by PO route  Subjective:   Reports to be feeling better today and excited about lower creatinine   Objective:   BP 100/59  Pulse 74  Temp(Src) 98.2 F (36.8 C) (Oral)  Resp 18  Ht 5\' 10"  (1.778 m)  Wt 75 kg (165 lb 5.5 oz)  BMI 23.72 kg/m2  SpO2 100%  Intake/Output Summary (Last 24 hours) at 11/29/13 0843 Last data filed at 11/29/13 0558  Gross per 24 hour  Intake 418.33 ml  Output   1626 ml  Net -1207.67 ml   Weight change: 0.837 kg (1 lb 13.5 oz)  Physical Exam: [id number] resting in bed [id number] RRR, normal S1 and S2 Resp:CTA bilaterally, no rales/rhonchi [id number], flat, NT, BS normal Ext:No LE edema  Imaging: US Renal  11/28/2013   CLINICAL DATA:  Acute superimposed on chronic renal disease. History of bladder surgery.  EXAM: RENAL/URINARY TRACT ULTRASOUND COMPLETE  COMPARISON:  None.  FINDINGS: Right Kidney:  Length: 11.7 cm. Echogenicity within normal limits. No mass visualized. There is right hydronephrosis.  Left Kidney:  Length: 11.9 cm. Echogenicity within normal limits. No mass or hydronephrosis visualized.  Bladder:  The patient is status post prior bladder surgery with tube identified in the bladder.  IMPRESSION: Right  hydronephrosis. Status post prior bladder surgery with tube identified within the decompressed bladder.   Electronically Signed   By: Abelardo Diesel M.D.   On: 11/28/2013 15:23    Labs: BMET  Recent Labs Lab 11/27/13 1215 11/27/13 1829 11/28/13 0721 11/28/13 1612 11/29/13 0659  NA 134* 137 137 137 137  K 4.4 4.3 4.1 3.3* 3.0*  CL 109 112 115* 112 107  CO2 11* 11* 11* 11* 15*  GLUCOSE 113* 92 83 129* 109*  BUN 83* 82* 83* 77* 73*  CREATININE 3.42* 3.25* 3.31* 3.19* 2.92*  CALCIUM 11.8* 11.1* 10.4 9.7 9.2  PHOS  --  3.3  --   --  2.9   CBC  Recent Labs Lab 11/27/13 1215 11/27/13 1829 11/28/13 0721  WBC 9.2 9.9 7.5  NEUTROABS 6.6 6.9  --   HGB 13.9 12.8* 11.1*  HCT 41.5 37.4* 33.2*  MCV 83.0 81.3 81.4  PLT 274 243 215    Medications:    . citric acid-sodium citrate  15 mL Oral TID PC & HS  . heparin subcutaneous  5,000 Units Subcutaneous 3 times per day  . linezolid  600 mg Oral Q12H  . pantoprazole  40 mg Oral Daily  . polyethylene glycol  17 g Oral Daily  . sodium chloride  3 mL Intravenous Q12H   Elmarie Shiley, MD 11/29/2013, 8:43 AM

## 2013-11-29 NOTE — Plan of Care (Signed)
Problem: Phase I Progression Outcomes Goal: Initial discharge plan identified Outcome: Completed/Met Date Met:  11/29/13 To return home with wife

## 2013-11-29 NOTE — Progress Notes (Signed)
[facility] PROGRESS NOTE  [name] [name] [id number] DOB: [birth year] DOA: 11/27/2013 PCP: Florina Ou, MD  Assessment/Plan: Acute renal failure on chronic kidney disease stage III  -Baseline Cr around 2, (8/27) 2.92- trending downward  -Likely due to UTI, poor oral intake,?malfunctionning Right Nephrostomy tube (see below) -Appreciate Nephro consult- No current need for dialysis, continue IV fluids   ?Malfunctioning Right Nephrostomy Tube -Renal US- shows right hydronephrosis, per patient- tube not draining like usual, have asked IR to evaluate with nephrostogram-to see if tube malfunctioning. Await official result. Case d/w Dr Janice Norrie over the phone as well, who recommended IR eval.  Metabolic acidosis  -suspect secondary to ARF/Neobladder  -improving on IV bicarb   UTI with Pyelonephritis  Last admission- Enterococcus. He was discharged on Amoxacillin not sensitive to Ampicillin. Discussed case with Dr Comer-recommended Zyvox (8/26) For 2 weeks - better coverage for enterococcus. Discontinue Levaquin. Remains afebrile and non toxic appearing.  -repeat urine culture pending   History of Bladder cancer and Right Nephroureteral Stent and Nephrostomy tube  -Monitor urine output closely-nephrostomy tube ?malfunctioning-see above  Anemia of chronic kidney disease  Hgb 11( 8/26)  Continue to monitor  outpatient follow up   Hypercalcemia  Resolved  Dehydration likely cause   GERD  Continue home regimen of pantoperazole  Generalized Weakness/Fatigue  PT evaluation ordered   History of anxiety and depression:  -Stable. Will stop Celexa as Zyvox being started.   DVT Prophylaxis: Heparin sq Code Status: Full Family Communication: No family at bedside Disposition Plan: Home when medically stable   Consultants:  Urology  Interventional Urology  Procedures:  None  Antibiotics:  None  HPI/Subjective: States feels OK today.    Objective: Filed Vitals:    11/29/13 0504  BP: 100/59  Pulse: 74  Temp: 98.2 F (36.8 C)  Resp: 18    Intake/Output Summary (Last 24 hours) at 11/29/13 1318 Last data filed at 11/29/13 1013  Gross per 24 hour  Intake 418.33 ml  Output   1425 ml  Net -1006.67 ml   Filed Weights   11/27/13 1641 11/28/13 0508 11/29/13 0504  Weight: 74.163 kg (163 lb 8 oz) 75 kg (165 lb 5.5 oz) 75 kg (165 lb 5.5 oz)    Exam:  Gen: Alert and oriented, in no acute distress  HEENT: Normocephalic, atraumatic. Pupils symmertrical. Moist mucosa.  Chest: clear to auscultate bilaterally, no ronchi or rales  Cardiac: Regular rate and rhythm, S1-S2, no rubs murmurs or gallops  Abdomen: soft, non tender, non distended, +bowel sounds. No guarding or rigidity  Genitourinary: Urostomy bag at the right flank Extremities: Symmetrical in appearance without cyanosis or edema  Neurological: Alert awake oriented to time place and person.  Skin: rash noted on trunk and anterior of arms bilaterally  Psychiatric: Appears normal.   Data Reviewed: Basic Metabolic Panel:  Recent Labs Lab 11/27/13 1215 11/27/13 1829 11/28/13 0721 11/28/13 1612 11/29/13 0659  NA 134* 137 137 137 137  K 4.4 4.3 4.1 3.3* 3.0*  CL 109 112 115* 112 107  CO2 11* 11* 11* 11* 15*  GLUCOSE 113* 92 83 129* 109*  BUN 83* 82* 83* 77* 73*  CREATININE 3.42* 3.25* 3.31* 3.19* 2.92*  CALCIUM 11.8* 11.1* 10.4 9.7 9.2  MG  --  2.2  --   --  2.0  PHOS  --  3.3  --   --  2.9   Liver Function Tests:  Recent Labs Lab 11/27/13 1215 11/27/13 1829 11/28/13 0721 11/29/13 [id number]  AST 12 9 9   --   ALT 10 8 7   --   ALKPHOS 57 51 43  --   BILITOT 0.2* 0.2* <0.2*  --   PROT 7.8 7.1 6.3  --   ALBUMIN 3.6 3.4* 2.9* 2.8*   No results found for this basename: LIPASE, AMYLASE,  in the last 168 hours No results found for this basename: AMMONIA,  in the last 168 hours CBC:  Recent Labs Lab 11/27/13 1215 11/27/13 1829 11/28/13 0721  WBC 9.2 9.9 7.5  NEUTROABS 6.6 6.9   --   HGB 13.9 12.8* 11.1*  HCT 41.5 37.4* 33.2*  MCV 83.0 81.3 81.4  PLT 274 243 215   Cardiac Enzymes: No results found for this basename: CKTOTAL, CKMB, CKMBINDEX, TROPONINI,  in the last 168 hours BNP (last 3 results)  Recent Labs  10/30/13 1919  PROBNP 96.4   CBG:  Recent Labs Lab 11/27/13 1659 11/28/13 0757 11/29/13 0749  GLUCAP 71 97 107*    No results found for this or any previous visit (from the past 240 hour(s)).   Studies: US Renal  11/28/2013   CLINICAL DATA:  Acute superimposed on chronic renal disease. History of bladder surgery.  EXAM: RENAL/URINARY TRACT ULTRASOUND COMPLETE  COMPARISON:  None.  FINDINGS: Right Kidney:  Length: 11.7 cm. Echogenicity within normal limits. No mass visualized. There is right hydronephrosis.  Left Kidney:  Length: 11.9 cm. Echogenicity within normal limits. No mass or hydronephrosis visualized.  Bladder:  The patient is status post prior bladder surgery with tube identified in the bladder.  IMPRESSION: Right hydronephrosis. Status post prior bladder surgery with tube identified within the decompressed bladder.   Electronically Signed   By: Abelardo Diesel M.D.   On: 11/28/2013 15:23    Scheduled Meds: . citric acid-sodium citrate  15 mL Oral TID PC & HS  . heparin subcutaneous  5,000 Units Subcutaneous 3 times per day  . linezolid  600 mg Oral Q12H  . pantoprazole  40 mg Oral Daily  . polyethylene glycol  17 g Oral Daily  . potassium chloride  40 mEq Oral BID  . sodium chloride  3 mL Intravenous Q12H   Continuous Infusions: .  sodium bicarbonate  infusion 1000 mL 125 mL/hr at 11/29/13 [id number]    Principal Problem:   AKI (acute kidney injury) Active Problems:   Bladder cancer   UTI (lower urinary tract infection)   Hypercalcemia    Time spent:   Lacy Duverney [facility]  [facility] Pager [phone]. If 7PM-7AM, please contact night-coverage at [email or web address], password [facility] 11/29/2013, 1:18 PM  LOS: 2 days    Attending Patient was seen, examined,treatment plan was discussed with the Physician extender. I have directly reviewed the clinical findings, lab, imaging studies and management of this patient in detail. I have made the necessary changes to the above noted documentation, and agree with the documentation, as recorded by the Physician extender.  Nena Alexander MD [facility].

## 2013-11-30 LAB — RENAL FUNCTION PANEL
Albumin: 2.8 g/dL — ABNORMAL LOW (ref 3.5–5.2)
Anion gap: 11 (ref 5–15)
BUN: 62 mg/dL — ABNORMAL HIGH (ref 6–23)
CALCIUM: 9.1 mg/dL (ref 8.4–10.5)
CO2: 24 meq/L (ref 19–32)
CREATININE: 2.84 mg/dL — AB (ref 0.50–1.35)
Chloride: 106 mEq/L (ref 96–112)
GFR calc Af Amer: 27 mL/min — ABNORMAL LOW (ref >90)
GFR calc non Af Amer: 23 mL/min — ABNORMAL LOW (ref >90)
Glucose, Bld: 128 mg/dL — ABNORMAL HIGH (ref 70–99)
Phosphorus: 1.9 mg/dL — ABNORMAL LOW (ref 2.3–4.6)
Potassium: 2.9 mEq/L — CL (ref 3.7–5.3)
Sodium: 141 mEq/L (ref 137–147)

## 2013-11-30 LAB — MAGNESIUM: MAGNESIUM: 2 mg/dL (ref 1.5–2.5)

## 2013-11-30 LAB — GLUCOSE, CAPILLARY: GLUCOSE-CAPILLARY: 116 mg/dL — AB (ref 70–99)

## 2013-11-30 MED ORDER — LINEZOLID 600 MG PO TABS: 600.0000 mg | ORAL_TABLET | Freq: Two times a day (BID) | ORAL | Status: DC

## 2013-11-30 MED ORDER — POTASSIUM CHLORIDE CRYS ER 20 MEQ PO TBCR
40.0000 meq | EXTENDED_RELEASE_TABLET | Freq: Two times a day (BID) | ORAL | Status: AC
  Administered 2013-11-30 – 2013-12-01 (×3): 40 meq via ORAL
  Filled 2013-11-30 (×3): qty 2

## 2013-11-30 NOTE — Care Management Note (Signed)
    Page 1 of 1   11/30/2013     4:57:33 PM CARE MANAGEMENT NOTE 11/30/2013  Patient:  [name] [name], [name] [name]   Account Number:  [id number]  Date Initiated:  11/30/2013  Documentation initiated by:  Tomi Bamberger  Subjective/Objective Assessment:   dx acte kidney injury, weakness, uti, met acidoisi, acute/chronic renal failure  admit-lives with spouse.     Action/Plan:   Anticipated DC Date:  12/01/2013   Anticipated DC Plan:  HOME/SELF CARE      DC Planning Services  CM consult      Choice offered to / List presented to:             Status of service:  Completed, signed off Medicare Important Message given?  NO (If response is "NO", the following Medicare IM given date fields will be blank) Date Medicare IM given:   Medicare IM given by:   Date Additional Medicare IM given:   Additional Medicare IM given by:    Discharge Disposition:  HOME/SELF CARE  Per UR Regulation:  Reviewed for med. necessity/level of care/duration of stay  If discussed at [place] of Stay Meetings, dates discussed:    Comments:  8/28/115 1656 Tomi Bamberger RN, BSN [phone] patient is for dc tomorrow, NCM went to outpt pharmacy to get zyvox for patient, gave to staff RN Adrianne, also informed charge RN .  patient will need to get his zyvox at dc tomorrow.

## 2013-11-30 NOTE — Progress Notes (Addendum)
CRITICAL VALUE ALERT  Critical value received:  K 2.9  Date of notification:  11/30/13  Time of notification:  0937  Critical value read back:Yes.    Nurse who received alert:  Rexford Maus  MD notified (1st page):  Dr. Sloan Leiter  Time of first page:  [phone]  MD notified (2nd page):  Time of second page:  Responding MD:  Dr. Sloan Leiter  Time MD responded:  [phone]

## 2013-11-30 NOTE — Progress Notes (Signed)
Patient ID: [name] [name], male   DOB: 1961-01-08, 53 y.o.   MRN: [id number]  [facility] Progress Note    Assessment/ Plan:   1. Acute renal failure on chronic kidney disease stage III-IV: renal function sluggishly improving with good UOP on IVFs and ongoing antibiotic to treat UTI. DC IVFs and encourage PO intake. 2. Metabolic acidosis: improved on IVFs,siwtch to oral sodium citrate .  3. Urinary tract infection/pyelonephritis:  Switched to Linezolid UCx/gram stain with GPCs 4. Anemia of chronic kidney disease: monitor Hgb trend, no ESA at this time 5. Hypokalemia: due to poor intake and shifting by sodium bicarbonate gtt, will replace by PO route  Subjective:   Reports to be feeling better today and looking forward to stopping IV fluids   Objective:   BP 119/56  Pulse 80  Temp(Src) 98.2 F (36.8 C) (Tympanic)  Resp 18  Ht 5\' 10"  (1.778 m)  Wt 78.2 kg (172 lb 6.4 oz)  BMI 24.74 kg/m2  SpO2 95%  Intake/Output Summary (Last 24 hours) at 11/30/13 0942 Last data filed at 11/30/13 [id number]  Gross per 24 hour  Intake 3142.08 ml  Output   1400 ml  Net 1742.08 ml   Weight change: 3.2 kg (7 lb 0.9 oz)  Physical Exam: [id number] resting in bed [id number] RRR, normal S1 and S2 Resp:CTA bilaterally, no rales/rhonchi [id number], flat, NT, BS normal Ext:No LE edema  Imaging: US Renal  11/28/2013   CLINICAL DATA:  Acute superimposed on chronic renal disease. History of bladder surgery.  EXAM: RENAL/URINARY TRACT ULTRASOUND COMPLETE  COMPARISON:  None.  FINDINGS: Right Kidney:  Length: 11.7 cm. Echogenicity within normal limits. No mass visualized. There is right hydronephrosis.  Left Kidney:  Length: 11.9 cm. Echogenicity within normal limits. No mass or hydronephrosis visualized.  Bladder:  The patient is status post prior bladder surgery with tube identified in the bladder.  IMPRESSION: Right hydronephrosis. Status post prior bladder surgery with tube identified within  the decompressed bladder.   Electronically Signed   By: Abelardo Diesel M.D.   On: 11/28/2013 15:23   Ir Nephrostogram Right  11/29/2013   CLINICAL DATA:  53 year old male with right-sided percutaneous nephro ureteral stent placed at an outside hospital. There is some clinical concern it is not draining properly.  EXAM: RIGHT NEPHROSTOGRAM  Date: 11/29/2013  PROCEDURE: 1. Right antegrade nephroureterogram through existing nephro ureteral stent Interventional Radiologist:  Criselda Peaches, MD  ANESTHESIA/SEDATION: None required  FLUOROSCOPY TIME:  1 min 48 seconds  CONTRAST:  18mL OMNIPAQUE IOHEXOL 300 MG/ML  SOLN  TECHNIQUE: Informed consent was obtained from the patient following explanation of the procedure, risks, benefits and alternatives. The patient understands, agrees and consents for the procedure. All questions were addressed. A time out was performed.  A gentle hand injection of contrast material was performed through the patient's existing tube. There is mild hydronephrosis. Initially, there is a very slow drainage through the nephro ureteral stent. However, after observation for less than 2 min contrast material passes down the ureter and through the tube into the diverting ileostomy. By 5 min, the renal collecting system is completely decompressed.  IMPRESSION: Normally functioning and positioned right percutaneous nephro ureteral stent.   Electronically Signed   By: Jacqulynn Cadet M.D.   On: 11/29/2013 16:41    Labs: BMET  Recent Labs Lab 11/27/13 1215 11/27/13 1829 11/28/13 0721 11/28/13 1612 11/29/13 0659 11/30/13 0740  NA 134* 137 137 137 137 141  K 4.4 4.3 4.1  3.3* 3.0* 2.9*  CL 109 112 115* 112 107 106  CO2 11* 11* 11* 11* 15* 24  GLUCOSE 113* 92 83 129* 109* 128*  BUN 83* 82* 83* 77* 73* 62*  CREATININE 3.42* 3.25* 3.31* 3.19* 2.92* 2.84*  CALCIUM 11.8* 11.1* 10.4 9.7 9.2 9.1  PHOS  --  3.3  --   --  2.9 1.9*   CBC  Recent Labs Lab 11/27/13 1215 11/27/13 1829  11/28/13 0721  WBC 9.2 9.9 7.5  NEUTROABS 6.6 6.9  --   HGB 13.9 12.8* 11.1*  HCT 41.5 37.4* 33.2*  MCV 83.0 81.3 81.4  PLT 274 243 215    Medications:    . citric acid-sodium citrate  15 mL Oral TID PC & HS  . feeding supplement (NEPRO CARB STEADY)  237 mL Oral TID BM  . heparin subcutaneous  5,000 Units Subcutaneous 3 times per day  . linezolid  600 mg Oral Q12H  . pantoprazole  40 mg Oral Daily  . polyethylene glycol  17 g Oral Daily  . potassium chloride  40 mEq Oral BID  . sodium chloride  3 mL Intravenous Q12H   Elmarie Shiley, MD 11/30/2013, 9:42 AM

## 2013-11-30 NOTE — Consult Note (Addendum)
Urology Consult  Referring physician: Oren Binet, MD Reason for referral: Malfunction nephroureteral catheter.   Chief Complaint: No urinary output from nephroureteral catheter.  History of Present Illness: [name] [name] is a 53 years old male with history of high grade TCC bladder.  He had radical cystectomy and neobladder in [place] about three years ago.  He has a right nephroureteral catheter presumably for ureteral anastomotic stricture.  He was admitted with progressive weakness, fatigue, poor oral intake.  The nephroureteral catheter has not been draining well.  He voids on his own without  difficulty. He does not have any flank pain. The catheter was replaced about 7 weeks ago.  He has a scheduled follow-up appointment in [place] in 2-3 weeks.    Past Medical History  Diagnosis Date  . Gout     recent flair -bilateral feet--  STABLE  . Frequency of urination   . Urgency incontinence   . Nocturia   . Diabetes mellitus type 2, diet-controlled PT STOPPED TAKING METFORMIN--  HAS BEEN WATCHING DIET, EXERCISING AND LOSING WT  . Bladder cancer 2013  . Complication of anesthesia     agitation w/awakening in 9/13,OK 01/24/12  . Hypertension   . Anxiety   . Depression   . GERD (gastroesophageal reflux disease)   . Acute renal injury 11/2013   Past Surgical History  Procedure Laterality Date  . Ulner artery repair Right 2009    rt -trauma /thrombectomy,repair  . Transurethral resection of bladder tumor  12/15/2011    Procedure: TRANSURETHRAL RESECTION OF BLADDER TUMOR (TURBT);  Surgeon: Molli Hazard, MD;  Location: [facility];  Service: Urology;  Laterality: N/A;  2 HRS   . Lumbar laminectomy  06-02-2005    W/ RESECTION NERVE ROOT  L4  -  L5  . Transurethral resection of bladder tumor  01/24/2012    Procedure: TRANSURETHRAL RESECTION OF BLADDER TUMOR (TURBT);  Surgeon: Molli Hazard, MD;  Location: [facility];  Service: Urology;   Laterality: N/A;  90 MIN   . Cystoscopy with urethral dilatation  01/24/2012    Procedure: CYSTOSCOPY WITH URETHRAL DILATATION;  Surgeon: Molli Hazard, MD;  Location: [facility];  Service: Urology;  Laterality: N/A;  . Transurethral resection of bladder tumor N/A 07/10/2012    Procedure: TRANSURETHRAL RESECTION OF BLADDER TUMOR (TURBT);  Surgeon: Molli Hazard, MD;  Location: [facility];  Service: Urology;  Laterality: N/A;  . Cystoscopy w/ retrogrades Bilateral 07/10/2012    Procedure: CYSTOSCOPY WITH RETROGRADE PYELOGRAM;  Surgeon: Molli Hazard, MD;  Location: [facility];  Service: Urology;  Laterality: Bilateral;  . Back surgery    . Bladder surgery  04/26/2013    at the [facility] [place] in  [place]      Medications: Citalopram,levofloxacin,citric acid-sodium citrate, omeprazole Allergies:  Allergies  Allergen Reactions  . Lisinopril Cough    Family History  Problem Relation Age of Onset  . Diabetes Mellitus II Other   . Hypertension Other   . Bladder Cancer Neg Hx    Social History:  reports that he quit smoking about 21 years ago. His smoking use included Cigarettes. He has a 16 pack-year smoking history. He quit smokeless tobacco use about 21 years ago. His smokeless tobacco use included Chew. He reports that he does not drink alcohol or use illicit drugs.  ROS: All systems are reviewed and negative except as noted.   Physical Exam:  Vital signs  in last 24 hours: Temp:  [98.1 F (36.7 C)-98.8 F (37.1 C)] 98.8 F (37.1 C) (08/27 2117) Pulse Rate:  [72-74] 72 (08/27 2117) Resp:  [18] 18 (08/27 2117) BP: (100-122)/(59-70) 122/70 mmHg (08/27 2117) SpO2:  [100 %] 100 % (08/27 2117) Weight:  [75 kg (165 lb 5.5 oz)] 75 kg (165 lb 5.5 oz) (08/27 0504)  Cardiovascular: Skin warm; not flushed Respiratory: Breaths quiet; no shortness of breath Abdomen: No masses Neurological: Normal  sensation to touch Musculoskeletal: Normal motor function arms and legs Lymphatics: No inguinal adenopathy Skin: No rashes Genitourinary:Penis is normal.  Scrotum is unremarkable. Rectal: Deferred  Laboratory Data:  Results for orders placed during the hospital encounter of 11/27/13 (from the past 72 hour(s))  CBC WITH DIFFERENTIAL     Status: Abnormal   Collection Time    11/27/13 12:15 PM      Result Value Ref Range   WBC 9.2  4.0 - 10.5 K/uL   RBC 5.00  4.22 - 5.81 MIL/uL   Hemoglobin 13.9  13.0 - 17.0 g/dL   HCT 41.5  39.0 - 52.0 %   MCV 83.0  78.0 - 100.0 fL   MCH 27.8  26.0 - 34.0 pg   MCHC 33.5  30.0 - 36.0 g/dL   RDW 17.4 (*) 11.5 - 15.5 %   Platelets 274  150 - 400 K/uL   Neutrophils Relative % 71  43 - 77 %   Neutro Abs 6.6  1.7 - 7.7 K/uL   Lymphocytes Relative 22  12 - 46 %   Lymphs Abs 2.0  0.7 - 4.0 K/uL   Monocytes Relative 6  3 - 12 %   Monocytes Absolute 0.5  0.1 - 1.0 K/uL   Eosinophils Relative 1  0 - 5 %   Eosinophils Absolute 0.1  0.0 - 0.7 K/uL   Basophils Relative 0  0 - 1 %   Basophils Absolute 0.0  0.0 - 0.1 K/uL  COMPREHENSIVE METABOLIC PANEL     Status: Abnormal   Collection Time    11/27/13 12:15 PM      Result Value Ref Range   Sodium 134 (*) 137 - 147 mEq/L   Potassium 4.4  3.7 - 5.3 mEq/L   Chloride 109  96 - 112 mEq/L   CO2 11 (*) 19 - 32 mEq/L   Glucose, Bld 113 (*) 70 - 99 mg/dL   BUN 83 (*) 6 - 23 mg/dL   Creatinine, Ser 3.42 (*) 0.50 - 1.35 mg/dL   Calcium 11.8 (*) 8.4 - 10.5 mg/dL   Total Protein 7.8  6.0 - 8.3 g/dL   Albumin 3.6  3.5 - 5.2 g/dL   AST 12  0 - 37 U/L   ALT 10  0 - 53 U/L   Alkaline Phosphatase 57  39 - 117 U/L   Total Bilirubin 0.2 (*) 0.3 - 1.2 mg/dL   GFR calc non Af Amer 19 (*) >90 mL/min   GFR calc Af Amer 22 (*) >90 mL/min   Comment: (NOTE)     The eGFR has been calculated using the CKD EPI equation.     This calculation has not been validated in all clinical situations.     eGFR's persistently <90 mL/min  signify possible Chronic Kidney     Disease.   Anion gap 14  5 - 15  URINALYSIS, ROUTINE W REFLEX MICROSCOPIC     Status: Abnormal   Collection Time    11/27/13 12:51 PM  Result Value Ref Range   Color, Urine YELLOW  YELLOW   APPearance CLOUDY (*) CLEAR   Specific Gravity, Urine 1.014  1.005 - 1.030   pH 7.0  5.0 - 8.0   Glucose, UA NEGATIVE  NEGATIVE mg/dL   Hgb urine dipstick MODERATE (*) NEGATIVE   Bilirubin Urine NEGATIVE  NEGATIVE   Ketones, ur NEGATIVE  NEGATIVE mg/dL   Protein, ur 100 (*) NEGATIVE mg/dL   Urobilinogen, UA 0.2  0.0 - 1.0 mg/dL   Nitrite NEGATIVE  NEGATIVE   Leukocytes, UA LARGE (*) NEGATIVE  URINE MICROSCOPIC-ADD ON     Status: Abnormal   Collection Time    11/27/13 12:51 PM      Result Value Ref Range   Squamous Epithelial / LPF RARE  RARE   WBC, UA TOO NUMEROUS TO COUNT  <3 WBC/hpf   RBC / HPF 3-6  <3 RBC/hpf   Bacteria, UA MANY (*) RARE  I-STAT VENOUS BLOOD GAS, ED     Status: Abnormal   Collection Time    11/27/13  3:04 PM      Result Value Ref Range   pH, Ven 7.147 (*) 7.250 - 7.300   pCO2, Ven 31.4 (*) 45.0 - 50.0 mmHg   pO2, Ven 39.0  30.0 - 45.0 mmHg   Bicarbonate 10.9 (*) 20.0 - 24.0 mEq/L   TCO2 12  0 - 100 mmol/L   O2 Saturation 59.0     Acid-base deficit 17.0 (*) 0.0 - 2.0 mmol/L   Sample type VENOUS     Comment NOTIFIED PHYSICIAN    GLUCOSE, CAPILLARY     Status: None   Collection Time    11/27/13  4:59 PM      Result Value Ref Range   Glucose-Capillary 71  70 - 99 mg/dL  COMPREHENSIVE METABOLIC PANEL     Status: Abnormal   Collection Time    11/27/13  6:29 PM      Result Value Ref Range   Sodium 137  137 - 147 mEq/L   Potassium 4.3  3.7 - 5.3 mEq/L   Chloride 112  96 - 112 mEq/L   CO2 11 (*) 19 - 32 mEq/L   Glucose, Bld 92  70 - 99 mg/dL   BUN 82 (*) 6 - 23 mg/dL   Creatinine, Ser 3.25 (*) 0.50 - 1.35 mg/dL   Calcium 11.1 (*) 8.4 - 10.5 mg/dL   Total Protein 7.1  6.0 - 8.3 g/dL   Albumin 3.4 (*) 3.5 - 5.2 g/dL   AST 9   0 - 37 U/L   ALT 8  0 - 53 U/L   Alkaline Phosphatase 51  39 - 117 U/L   Total Bilirubin 0.2 (*) 0.3 - 1.2 mg/dL   GFR calc non Af Amer 20 (*) >90 mL/min   GFR calc Af Amer 23 (*) >90 mL/min   Comment: (NOTE)     The eGFR has been calculated using the CKD EPI equation.     This calculation has not been validated in all clinical situations.     eGFR's persistently <90 mL/min signify possible Chronic Kidney     Disease.   Anion gap 14  5 - 15  MAGNESIUM     Status: None   Collection Time    11/27/13  6:29 PM      Result Value Ref Range   Magnesium 2.2  1.5 - 2.5 mg/dL  PHOSPHORUS     Status: None   Collection Time  11/27/13  6:29 PM      Result Value Ref Range   Phosphorus 3.3  2.3 - 4.6 mg/dL  CBC WITH DIFFERENTIAL     Status: Abnormal   Collection Time    11/27/13  6:29 PM      Result Value Ref Range   WBC 9.9  4.0 - 10.5 K/uL   RBC 4.60  4.22 - 5.81 MIL/uL   Hemoglobin 12.8 (*) 13.0 - 17.0 g/dL   HCT 37.4 (*) 39.0 - 52.0 %   MCV 81.3  78.0 - 100.0 fL   MCH 27.8  26.0 - 34.0 pg   MCHC 34.2  30.0 - 36.0 g/dL   RDW 17.0 (*) 11.5 - 15.5 %   Platelets 243  150 - 400 K/uL   Neutrophils Relative % 68  43 - 77 %   Neutro Abs 6.9  1.7 - 7.7 K/uL   Lymphocytes Relative 25  12 - 46 %   Lymphs Abs 2.5  0.7 - 4.0 K/uL   Monocytes Relative 5  3 - 12 %   Monocytes Absolute 0.5  0.1 - 1.0 K/uL   Eosinophils Relative 2  0 - 5 %   Eosinophils Absolute 0.2  0.0 - 0.7 K/uL   Basophils Relative 0  0 - 1 %   Basophils Absolute 0.0  0.0 - 0.1 K/uL  APTT     Status: None   Collection Time    11/27/13  6:29 PM      Result Value Ref Range   aPTT 31  24 - 37 seconds  TSH     Status: None   Collection Time    11/27/13  6:29 PM      Result Value Ref Range   TSH 2.360  0.350 - 4.500 uIU/mL  PROTIME-INR     Status: None   Collection Time    11/27/13  6:29 PM      Result Value Ref Range   Prothrombin Time 15.0  11.6 - 15.2 seconds   INR 1.18  0.00 - 1.49  COMPREHENSIVE METABOLIC  PANEL     Status: Abnormal   Collection Time    11/28/13  7:21 AM      Result Value Ref Range   Sodium 137  137 - 147 mEq/L   Potassium 4.1  3.7 - 5.3 mEq/L   Chloride 115 (*) 96 - 112 mEq/L   CO2 11 (*) 19 - 32 mEq/L   Glucose, Bld 83  70 - 99 mg/dL   BUN 83 (*) 6 - 23 mg/dL   Creatinine, Ser 3.31 (*) 0.50 - 1.35 mg/dL   Calcium 10.4  8.4 - 10.5 mg/dL   Total Protein 6.3  6.0 - 8.3 g/dL   Albumin 2.9 (*) 3.5 - 5.2 g/dL   AST 9  0 - 37 U/L   ALT 7  0 - 53 U/L   Alkaline Phosphatase 43  39 - 117 U/L   Total Bilirubin <0.2 (*) 0.3 - 1.2 mg/dL   GFR calc non Af Amer 19 (*) >90 mL/min   GFR calc Af Amer 23 (*) >90 mL/min   Comment: (NOTE)     The eGFR has been calculated using the CKD EPI equation.     This calculation has not been validated in all clinical situations.     eGFR's persistently <90 mL/min signify possible Chronic Kidney     Disease.   Anion gap 11  5 - 15  CBC  Status: Abnormal   Collection Time    11/28/13  7:21 AM      Result Value Ref Range   WBC 7.5  4.0 - 10.5 K/uL   RBC 4.08 (*) 4.22 - 5.81 MIL/uL   Hemoglobin 11.1 (*) 13.0 - 17.0 g/dL   HCT 01.7 (*) 20.9 - 10.6 %   MCV 81.4  78.0 - 100.0 fL   MCH 27.2  26.0 - 34.0 pg   MCHC 33.4  30.0 - 36.0 g/dL   RDW 81.6 (*) 61.9 - 69.4 %   Platelets 215  150 - 400 K/uL  GLUCOSE, CAPILLARY     Status: None   Collection Time    11/28/13  7:57 AM      Result Value Ref Range   Glucose-Capillary 97  70 - 99 mg/dL  URINE CULTURE     Status: None   Collection Time    11/28/13 11:14 AM      Result Value Ref Range   Specimen Description URINE, CATHETERIZED     Special Requests NONE     Culture  Setup Time       Value: 11/28/2013 17:11     Performed at [facility] Count       Value: 50,000 COLONIES/ML     Performed at [facility]   Culture       Value: GRAM POSITIVE COCCI     Performed at [facility]   Report Status PENDING    BASIC METABOLIC PANEL     Status: Abnormal    Collection Time    11/28/13  4:12 PM      Result Value Ref Range   Sodium 137  137 - 147 mEq/L   Potassium 3.3 (*) 3.7 - 5.3 mEq/L   Chloride 112  96 - 112 mEq/L   CO2 11 (*) 19 - 32 mEq/L   Glucose, Bld 129 (*) 70 - 99 mg/dL   BUN 77 (*) 6 - 23 mg/dL   Creatinine, Ser 0.98 (*) 0.50 - 1.35 mg/dL   Calcium 9.7  8.4 - 28.6 mg/dL   GFR calc non Af Amer 20 (*) >90 mL/min   GFR calc Af Amer 24 (*) >90 mL/min   Comment: (NOTE)     The eGFR has been calculated using the CKD EPI equation.     This calculation has not been validated in all clinical situations.     eGFR's persistently <90 mL/min signify possible Chronic Kidney     Disease.   Anion gap 14  5 - 15  RENAL FUNCTION PANEL     Status: Abnormal   Collection Time    11/29/13  6:59 AM      Result Value Ref Range   Sodium 137  137 - 147 mEq/L   Potassium 3.0 (*) 3.7 - 5.3 mEq/L   Chloride 107  96 - 112 mEq/L   CO2 15 (*) 19 - 32 mEq/L   Glucose, Bld 109 (*) 70 - 99 mg/dL   BUN 73 (*) 6 - 23 mg/dL   Creatinine, Ser 7.51 (*) 0.50 - 1.35 mg/dL   Calcium 9.2  8.4 - 98.2 mg/dL   Phosphorus 2.9  2.3 - 4.6 mg/dL   Albumin 2.8 (*) 3.5 - 5.2 g/dL   GFR calc non Af Amer 23 (*) >90 mL/min   GFR calc Af Amer 26 (*) >90 mL/min   Comment: (NOTE)     The eGFR has been calculated using  the CKD EPI equation.     This calculation has not been validated in all clinical situations.     eGFR's persistently <90 mL/min signify possible Chronic Kidney     Disease.   Anion gap 15  5 - 15  MAGNESIUM     Status: None   Collection Time    11/29/13  6:59 AM      Result Value Ref Range   Magnesium 2.0  1.5 - 2.5 mg/dL  GLUCOSE, CAPILLARY     Status: Abnormal   Collection Time    11/29/13  7:49 AM      Result Value Ref Range   Glucose-Capillary 107 (*) 70 - 99 mg/dL   Recent Results (from the past 240 hour(s))  URINE CULTURE     Status: None   Collection Time    11/28/13 11:14 AM      Result Value Ref Range Status   Specimen Description URINE,  CATHETERIZED   Final   Special Requests NONE   Final   Culture  Setup Time     Final   Value: 11/28/2013 17:11     Performed at [facility]     Final   Value: 50,000 COLONIES/ML     Performed at [facility]   Culture     Final   Value: [place]     Performed at [facility]   Report Status PENDING   Incomplete   Creatinine:  Recent Labs  11/27/13 1215 11/27/13 1829 11/28/13 0721 11/28/13 1612 11/29/13 0659  CREATININE 3.42* 3.25* 3.31* 3.19* 2.92*    Impression/Assessment:  High grade TCC bladder S/P radical cystectomy with neobladder Malfunction right nephroureteral catheter  Plan:  The catheter was irrigated by Dr Laurence Ferrari.  The catheter has been draining on and off. Suggest replace nephroureteral catheter.  He will keep his follow-up appointment with his urologist in [place].  Arvil Persons 11/30/2013, 12:29 AM    CC: Oren Binet, MD

## 2013-11-30 NOTE — Progress Notes (Addendum)
PATIENT DETAILS Name: [name] [name] Age: 53 y.o. Sex: male Date of Birth: [birth year] Admit Date: 11/27/2013 Admitting Physician Robbie Lis, MD ML:[id number], [name], MD  Subjective: No major complaints  Assessment/Plan: Acute renal failure on chronic kidney disease stage III  -slowly downtrending -Likely due to UTI, poor oral intake,?malfunctionning Right Nephrostomy tube (see below)  -Appreciate Nephro consult- No current need for dialysis, will stop IVF per renal recommendations  ?Malfunctioning Right Nephrostomy Tube  -Renal US- shows right hydronephrosis, per patient- tube not draining like usual,  IR  evaluated with nephrostogram-Tube in right position and functioning well. Per IR, if needs to be changed, then can be done in the outpatient settng. Appreciate Urology eval.  Metabolic acidosis  -suspect secondary to ARF/Neobladder  -improved on IV bicarb-transitioned to [place]  Hypokalemia -replete and recheck in am  UTI with Pyelonephritis  Last admission- Enterococcus. He was discharged on Amoxacillin not sensitive to Ampicillin. Discussed case with Dr Comer-recommended Zyvox (8/26) For 2 weeks - better coverage for enterococcus.  - Remains afebrile and non toxic appearing.  -repeat urine culture prelim-50,000 CFU of gram positive cocci  History of Bladder cancer and Right Nephroureteral Stent and Nephrostomy tube  -Monitor urine output closely-nephrostomy tube ?malfunctioning-see above. Patient will follow with primary urologist in [facility]  Anemia of chronic kidney disease  Hgb 11( 8/26)  Continue to monitor  outpatient follow up   Hypercalcemia  Resolved  Dehydration likely cause   GERD  Continue home regimen of pantoperazole   Generalized Weakness/Fatigue  PT evaluation ordered   History of anxiety and depression:  -Stable. Will stop Celexa as Zyvox being started.  Disposition: Remain inpatient  DVT Prophylaxis: Prophylactic  Heparin   Code  Status: Full code   Family Communication Spouse at bedside-multiple times during this hospitalization  Procedures:  None  CONSULTS:  nephrology  Time spent 40 minutes-which includes 50% of the time with face-to-face with patient/ family and coordinating care related to the above assessment and plan.    MEDICATIONS: Scheduled Meds: . citric acid-sodium citrate  15 mL Oral TID PC & HS  . feeding supplement (NEPRO CARB STEADY)  237 mL Oral TID BM  . heparin subcutaneous  5,000 Units Subcutaneous 3 times per day  . linezolid  600 mg Oral Q12H  . pantoprazole  40 mg Oral Daily  . polyethylene glycol  17 g Oral Daily  . potassium chloride  40 mEq Oral BID  . sodium chloride  3 mL Intravenous Q12H   Continuous Infusions: .  sodium bicarbonate  infusion 1000 mL 125 mL/hr at 11/30/13 0844   PRN Meds:.acetaminophen, acetaminophen, HYDROcodone-acetaminophen, ondansetron (ZOFRAN) IV, ondansetron  Antibiotics: Anti-infectives   Start     Dose/Rate Route Frequency Ordered Stop   11/30/13 0000  linezolid (ZYVOX) 600 MG tablet     600 mg Oral Every 12 hours 11/30/13 1340     11/28/13 1430  linezolid (ZYVOX) tablet 600 mg     600 mg Oral Every 12 hours 11/28/13 1344     11/27/13 1800  levofloxacin (LEVAQUIN) tablet 750 mg  Status:  Discontinued     750 mg Oral Every 48 hours 11/27/13 1639 11/28/13 1343       PHYSICAL EXAM: Vital signs in last 24 hours: Filed Vitals:   11/29/13 2117 11/30/13 0558 11/30/13 0603 11/30/13 0607  BP: 122/70  116/62 119/56  Pulse: 72  72 80  Temp: 98.8 F (37.1 C)  98.5 F (  36.9 C) 98.2 F (36.8 C)  TempSrc: Oral  Oral Tympanic  Resp: 18  18 18   Height:      Weight:  78.2 kg (172 lb 6.4 oz)    SpO2: 100%  99% 95%    Weight change: 3.2 kg (7 lb 0.9 oz) Filed Weights   11/28/13 0508 11/29/13 0504 11/30/13 0558  Weight: 75 kg (165 lb 5.5 oz) 75 kg (165 lb 5.5 oz) 78.2 kg (172 lb 6.4 oz)   Body mass index is 24.74 kg/(m^2).   Gen Exam:  Awake and alert with clear speech.   Neck: Supple, No JVD.   Chest: B/L Clear.   CVS: S1 S2 Regular, no murmurs.  Abdomen: soft, BS +, non tender, non distended.  Extremities: no edema, lower extremities warm to touch. Neurologic: Non Focal.   Skin: No Rash.   Wounds: N/A.    Intake/Output from previous day:  Intake/Output Summary (Last 24 hours) at 11/30/13 1346 Last data filed at 11/30/13 1226  Gross per 24 hour  Intake 3022.08 ml  Output   1550 ml  Net 1472.08 ml     LAB RESULTS: CBC  Recent Labs Lab 11/27/13 1215 11/27/13 1829 11/28/13 0721  WBC 9.2 9.9 7.5  HGB 13.9 12.8* 11.1*  HCT 41.5 37.4* 33.2*  PLT 274 243 215  MCV 83.0 81.3 81.4  MCH 27.8 27.8 27.2  MCHC 33.5 34.2 33.4  RDW 17.4* 17.0* 17.3*  LYMPHSABS 2.0 2.5  --   MONOABS 0.5 0.5  --   EOSABS 0.1 0.2  --   BASOSABS 0.0 0.0  --     Chemistries   Recent Labs Lab 11/27/13 1829 11/28/13 0721 11/28/13 1612 11/29/13 0659 11/30/13 0740  NA 137 137 137 137 141  K 4.3 4.1 3.3* 3.0* 2.9*  CL 112 115* 112 107 106  CO2 11* 11* 11* 15* 24  GLUCOSE 92 83 129* 109* 128*  BUN 82* 83* 77* 73* 62*  CREATININE 3.25* 3.31* 3.19* 2.92* 2.84*  CALCIUM 11.1* 10.4 9.7 9.2 9.1  MG 2.2  --   --  2.0 2.0    CBG:  Recent Labs Lab 11/27/13 1659 11/28/13 0757 11/29/13 0749 11/30/13 0808  GLUCAP 71 97 107* 116*    GFR Estimated Creatinine Clearance: 30.3 ml/min (by C-G formula based on Cr of 2.84).  Coagulation profile  Recent Labs Lab 11/27/13 1829  INR 1.18    Cardiac Enzymes No results found for this basename: CK, CKMB, TROPONINI, MYOGLOBIN,  in the last 168 hours  No components found with this basename: POCBNP,  No results found for this basename: DDIMER,  in the last 72 hours No results found for this basename: HGBA1C,  in the last 72 hours No results found for this basename: CHOL, HDL, LDLCALC, TRIG, CHOLHDL, LDLDIRECT,  in the last 72 hours  Recent Labs  11/27/13 1829  TSH 2.360    No results found for this basename: VITAMINB12, FOLATE, FERRITIN, TIBC, IRON, RETICCTPCT,  in the last 72 hours No results found for this basename: LIPASE, AMYLASE,  in the last 72 hours  Urine Studies No results found for this basename: UACOL, UAPR, USPG, UPH, UTP, UGL, UKET, UBIL, UHGB, UNIT, UROB, ULEU, UEPI, UWBC, URBC, UBAC, CAST, CRYS, UCOM, BILUA,  in the last 72 hours  MICROBIOLOGY: Recent Results (from the past 240 hour(s))  URINE CULTURE     Status: None   Collection Time    11/28/13 11:14 AM      Result  Value Ref Range Status   Specimen Description URINE, CATHETERIZED   Final   Special Requests NONE   Final   Culture  Setup Time     Final   Value: 11/28/2013 17:11     Performed at [facility]     Final   Value: 50,000 COLONIES/ML     Performed at [facility]   Culture     Final   Value: [place]     Performed at [facility]   Report Status PENDING   Incomplete    RADIOLOGY STUDIES/RESULTS: US Renal  11/28/2013   CLINICAL DATA:  Acute superimposed on chronic renal disease. History of bladder surgery.  EXAM: RENAL/URINARY TRACT ULTRASOUND COMPLETE  COMPARISON:  None.  FINDINGS: Right Kidney:  Length: 11.7 cm. Echogenicity within normal limits. No mass visualized. There is right hydronephrosis.  Left Kidney:  Length: 11.9 cm. Echogenicity within normal limits. No mass or hydronephrosis visualized.  Bladder:  The patient is status post prior bladder surgery with tube identified in the bladder.  IMPRESSION: Right hydronephrosis. Status post prior bladder surgery with tube identified within the decompressed bladder.   Electronically Signed   By: Abelardo Diesel M.D.   On: 11/28/2013 15:23   Ir Nephrostogram Right  11/29/2013   CLINICAL DATA:  53 year old male with right-sided percutaneous nephro ureteral stent placed at an outside hospital. There is some clinical concern it is not draining properly.  EXAM: RIGHT NEPHROSTOGRAM   Date: 11/29/2013  PROCEDURE: 1. Right antegrade nephroureterogram through existing nephro ureteral stent Interventional Radiologist:  Criselda Peaches, MD  ANESTHESIA/SEDATION: None required  FLUOROSCOPY TIME:  1 min 48 seconds  CONTRAST:  40mL OMNIPAQUE IOHEXOL 300 MG/ML  SOLN  TECHNIQUE: Informed consent was obtained from the patient following explanation of the procedure, risks, benefits and alternatives. The patient understands, agrees and consents for the procedure. All questions were addressed. A time out was performed.  A gentle hand injection of contrast material was performed through the patient's existing tube. There is mild hydronephrosis. Initially, there is a very slow drainage through the nephro ureteral stent. However, after observation for less than 2 min contrast material passes down the ureter and through the tube into the diverting ileostomy. By 5 min, the renal collecting system is completely decompressed.  IMPRESSION: Normally functioning and positioned right percutaneous nephro ureteral stent.   Electronically Signed   By: Jacqulynn Cadet M.D.   On: 11/29/2013 16:41    Oren Binet, MD  [facility] Pager:[phone]  If 7PM-7AM, please contact night-coverage [email or web address] Password [email or web address] 11/30/2013, 1:46 PM   LOS: 3 days   **Disclaimer: This note may have been dictated with voice recognition software. Similar sounding words can inadvertently be transcribed and this note may contain transcription errors which may not have been corrected upon publication of note.**

## 2013-11-30 NOTE — Progress Notes (Signed)
Patient ID: [name] [name], male   DOB: 01/02/1961, 53 y.o.   MRN: [id number]  Pt has existing R nephro ureteral stent in place follows with MD in [place] Now an inpt in [place] to return to [place] to see Uro MD there end of May per Dr Janice Norrie  Rt nephroureteral stent injection was performed yesterday Functioning well and without abnormality per IR findings  Dr Janice Norrie asked for possible exchange while here  Rec: IR can exchange nephroureteral stent if needed Should be done as Outpt and scheduled at [facility] Int Radiology  If need assistance with this please let me know Or call [phone] for appt time and date

## 2013-12-01 LAB — RENAL FUNCTION PANEL
ALBUMIN: 2.8 g/dL — AB (ref 3.5–5.2)
ANION GAP: 12 (ref 5–15)
BUN: 55 mg/dL — ABNORMAL HIGH (ref 6–23)
CHLORIDE: 110 meq/L (ref 96–112)
CO2: 22 mEq/L (ref 19–32)
Calcium: 9.2 mg/dL (ref 8.4–10.5)
Creatinine, Ser: 2.37 mg/dL — ABNORMAL HIGH (ref 0.50–1.35)
GFR calc non Af Amer: 29 mL/min — ABNORMAL LOW (ref >90)
GFR, EST AFRICAN AMERICAN: 34 mL/min — AB (ref >90)
Glucose, Bld: 90 mg/dL (ref 70–99)
POTASSIUM: 3.9 meq/L (ref 3.7–5.3)
Phosphorus: 1.9 mg/dL — ABNORMAL LOW (ref 2.3–4.6)
Sodium: 144 mEq/L (ref 137–147)

## 2013-12-01 LAB — URINE CULTURE: Colony Count: 50000

## 2013-12-01 LAB — GLUCOSE, CAPILLARY: Glucose-Capillary: 98 mg/dL (ref 70–99)

## 2013-12-01 LAB — MAGNESIUM: Magnesium: 1.9 mg/dL (ref 1.5–2.5)

## 2013-12-01 MED ORDER — CITRIC ACID-SODIUM CITRATE 334-500 MG/5ML PO SOLN: 15.0000 mL | Freq: Three times a day (TID) | ORAL | Status: DC

## 2013-12-01 MED ORDER — NEPRO/CARBSTEADY PO LIQD: 237.0000 mL | Freq: Three times a day (TID) | ORAL | Status: DC

## 2013-12-01 NOTE — Progress Notes (Signed)
Patient ID: [name] [name], male   DOB: Aug 24, 1960, 53 y.o.   MRN: [id number]  [facility] Progress Note    Assessment/ Plan:   1. Acute renal failure on chronic kidney disease stage III-IV: renal function continues to show improvement even with intravenous fluids being discontinued yesterday. No acute electrolyte abnormalities to prompt intervention at this time. 2. Metabolic acidosis: Improved after intravenous sodium bicarbonate therapy-currently on oral Bicitra .  3. Urinary tract infection/pyelonephritis:  Urine cultures show vancomycin sensitive enterococcus urinary tract infection, he is on oral linezolid 4. Anemia of chronic kidney disease: monitor Hgb trend, no ESA at this time 5. Hypokalemia: due to poor intake and shifting by sodium bicarbonate gtt, will replace by PO route  Subjective:   Reports to be feeling better but concerned because somebody had told him he had gained 10 pounds-I reviewed his chart and based on input/output he appears to have only gained 2.5 kg (5 pounds).    Objective:   BP 113/67  Pulse 75  Temp(Src) 98 F (36.7 C) (Oral)  Resp 12  Ht 5\' 10"  (1.778 m)  Wt 78.654 kg (173 lb 6.4 oz)  BMI 24.88 kg/m2  SpO2 99%  Intake/Output Summary (Last 24 hours) at 12/01/13 1003 Last data filed at 12/01/13 0900  Gross per 24 hour  Intake 2252.92 ml  Output    350 ml  Net 1902.92 ml   Weight change: 0.454 kg (1 lb)  Physical Exam: [id number] resting in recliner [id number] RRR, normal S1 and S2 Resp:CTA bilaterally, no rales/rhonchi [id number], flat, NT, BS normal Ext:No LE edema  Imaging: Ir Nephrostogram Right  11/29/2013   CLINICAL DATA:  53 year old male with right-sided percutaneous nephro ureteral stent placed at an outside hospital. There is some clinical concern it is not draining properly.  EXAM: RIGHT NEPHROSTOGRAM  Date: 11/29/2013  PROCEDURE: 1. Right antegrade nephroureterogram through existing nephro ureteral stent  Interventional Radiologist:  Criselda Peaches, MD  ANESTHESIA/SEDATION: None required  FLUOROSCOPY TIME:  1 min 48 seconds  CONTRAST:  20mL OMNIPAQUE IOHEXOL 300 MG/ML  SOLN  TECHNIQUE: Informed consent was obtained from the patient following explanation of the procedure, risks, benefits and alternatives. The patient understands, agrees and consents for the procedure. All questions were addressed. A time out was performed.  A gentle hand injection of contrast material was performed through the patient's existing tube. There is mild hydronephrosis. Initially, there is a very slow drainage through the nephro ureteral stent. However, after observation for less than 2 min contrast material passes down the ureter and through the tube into the diverting ileostomy. By 5 min, the renal collecting system is completely decompressed.  IMPRESSION: Normally functioning and positioned right percutaneous nephro ureteral stent.   Electronically Signed   By: Jacqulynn Cadet M.D.   On: 11/29/2013 16:41    Labs: BMET  Recent Labs Lab 11/27/13 1215 11/27/13 1829 11/28/13 0721 11/28/13 1612 11/29/13 0659 11/30/13 0740 12/01/13 0607  NA 134* 137 137 137 137 141 144  K 4.4 4.3 4.1 3.3* 3.0* 2.9* 3.9  CL 109 112 115* 112 107 106 110  CO2 11* 11* 11* 11* 15* 24 22  GLUCOSE 113* 92 83 129* 109* 128* 90  BUN 83* 82* 83* 77* 73* 62* 55*  CREATININE 3.42* 3.25* 3.31* 3.19* 2.92* 2.84* 2.37*  CALCIUM 11.8* 11.1* 10.4 9.7 9.2 9.1 9.2  PHOS  --  3.3  --   --  2.9 1.9* 1.9*   CBC  Recent Labs Lab  11/27/13 1215 11/27/13 1829 11/28/13 0721  WBC 9.2 9.9 7.5  NEUTROABS 6.6 6.9  --   HGB 13.9 12.8* 11.1*  HCT 41.5 37.4* 33.2*  MCV 83.0 81.3 81.4  PLT 274 243 215    Medications:    . citric acid-sodium citrate  15 mL Oral TID PC & HS  . feeding supplement (NEPRO CARB STEADY)  237 mL Oral TID BM  . heparin subcutaneous  5,000 Units Subcutaneous 3 times per day  . linezolid  600 mg Oral Q12H  .  pantoprazole  40 mg Oral Daily  . polyethylene glycol  17 g Oral Daily  . potassium chloride  40 mEq Oral BID  . sodium chloride  3 mL Intravenous Q12H   Elmarie Shiley, MD 12/01/2013, 10:03 AM

## 2013-12-01 NOTE — Discharge Summary (Signed)
PATIENT DETAILS Name: [name] [name] Age: 53 y.o. Sex: male Date of Birth: [birth year] MRN: [id number]. Admit Date: 11/27/2013 Admitting Physician: Robbie Lis, MD [id number], [name], MD  Recommendations for Outpatient Follow-up:  1. Repeat Renal function panel at next visit 2. Resume Celexa when Zyvox treatment is completed  PRIMARY DISCHARGE DIAGNOSIS:  Principal Problem:   AKI (acute kidney injury) Active Problems:   Bladder cancer   UTI (lower urinary tract infection)   Hypercalcemia   Protein-calorie malnutrition, severe      PAST MEDICAL HISTORY: Past Medical History  Diagnosis Date  . Gout     recent flair -bilateral feet--  STABLE  . Frequency of urination   . Urgency incontinence   . Nocturia   . Diabetes mellitus type 2, diet-controlled PT STOPPED TAKING METFORMIN--  HAS BEEN WATCHING DIET, EXERCISING AND LOSING WT  . Bladder cancer 2013  . Complication of anesthesia     agitation w/awakening in 9/13,OK 01/24/12  . Hypertension   . Anxiety   . Depression   . GERD (gastroesophageal reflux disease)   . Acute renal injury 11/2013    DISCHARGE MEDICATIONS:   Medication List    STOP taking these medications       citalopram 40 MG tablet  Commonly known as:  CELEXA     levofloxacin 750 MG tablet  Commonly known as:  LEVAQUIN      TAKE these medications       citric acid-sodium citrate 334-500 MG/5ML solution  Commonly known as:  ORACIT  Take 15 mLs by mouth 4 (four) times daily - after meals and at bedtime.     feeding supplement (NEPRO CARB STEADY) Liqd  Take 237 mLs by mouth 3 (three) times daily between meals.     linezolid 600 MG tablet  Commonly known as:  ZYVOX  Take 1 tablet (600 mg total) by mouth every 12 (twelve) hours.     omeprazole 40 MG capsule  Commonly known as:  PRILOSEC  Take 1 capsule (40 mg total) by mouth 2 (two) times daily before a meal.        ALLERGIES:   Allergies  Allergen Reactions  . Lisinopril Cough     BRIEF HPI:  See H&P, Labs, Consult and Test reports for all details in brief,53 year old male with past medical history of bladder cancer, has right nephroureteral stent, recent admission for UTI who presented to [facility] ED 11/27/2013 with progressive weakness, fatigue and poor po intake for past few days prior to this admission  CONSULTATIONS:   nephrology and urology  PERTINENT RADIOLOGIC STUDIES: US Renal  11/28/2013   CLINICAL DATA:  Acute superimposed on chronic renal disease. History of bladder surgery.  EXAM: RENAL/URINARY TRACT ULTRASOUND COMPLETE  COMPARISON:  None.  FINDINGS: Right Kidney:  Length: 11.7 cm. Echogenicity within normal limits. No mass visualized. There is right hydronephrosis.  Left Kidney:  Length: 11.9 cm. Echogenicity within normal limits. No mass or hydronephrosis visualized.  Bladder:  The patient is status post prior bladder surgery with tube identified in the bladder.  IMPRESSION: Right hydronephrosis. Status post prior bladder surgery with tube identified within the decompressed bladder.   Electronically Signed   By: Abelardo Diesel M.D.   On: 11/28/2013 15:23   Ir Nephrostogram Right  11/29/2013   CLINICAL DATA:  53 year old male with right-sided percutaneous nephro ureteral stent placed at an outside hospital. There is some clinical concern it is not draining properly.  EXAM: RIGHT NEPHROSTOGRAM  Date: 11/29/2013  PROCEDURE: 1. Right antegrade nephroureterogram through existing nephro ureteral stent Interventional Radiologist:  Criselda Peaches, MD  ANESTHESIA/SEDATION: None required  FLUOROSCOPY TIME:  1 min 48 seconds  CONTRAST:  18mL OMNIPAQUE IOHEXOL 300 MG/ML  SOLN  TECHNIQUE: Informed consent was obtained from the patient following explanation of the procedure, risks, benefits and alternatives. The patient understands, agrees and consents for the procedure. All questions were addressed. A time out was performed.  A gentle hand injection of contrast material was  performed through the patient's existing tube. There is mild hydronephrosis. Initially, there is a very slow drainage through the nephro ureteral stent. However, after observation for less than 2 min contrast material passes down the ureter and through the tube into the diverting ileostomy. By 5 min, the renal collecting system is completely decompressed.  IMPRESSION: Normally functioning and positioned right percutaneous nephro ureteral stent.   Electronically Signed   By: Jacqulynn Cadet M.D.   On: 11/29/2013 16:41     PERTINENT LAB RESULTS: CBC: No results found for this basename: WBC, HGB, HCT, PLT,  in the last 72 hours CMET CMP     Component Value Date/Time   NA 144 12/01/2013 0607   K 3.9 12/01/2013 0607   CL 110 12/01/2013 0607   CO2 22 12/01/2013 0607   GLUCOSE 90 12/01/2013 0607   BUN 55* 12/01/2013 0607   CREATININE 2.37* 12/01/2013 0607   CREATININE 2.53* 08/29/2013 1947   CALCIUM 9.2 12/01/2013 0607   CALCIUM 10.5 10/30/2013 1931   PROT 6.3 11/28/2013 0721   ALBUMIN 2.8* 12/01/2013 0607   AST 9 11/28/2013 0721   ALT 7 11/28/2013 0721   ALKPHOS 43 11/28/2013 0721   BILITOT <0.2* 11/28/2013 0721   GFRNONAA 29* 12/01/2013 0607   GFRAA 34* 12/01/2013 0607    GFR Estimated Creatinine Clearance: 36.4 ml/min (by C-G formula based on Cr of 2.37). No results found for this basename: LIPASE, AMYLASE,  in the last 72 hours No results found for this basename: CKTOTAL, CKMB, CKMBINDEX, TROPONINI,  in the last 72 hours No components found with this basename: POCBNP,  No results found for this basename: DDIMER,  in the last 72 hours No results found for this basename: HGBA1C,  in the last 72 hours No results found for this basename: CHOL, HDL, LDLCALC, TRIG, CHOLHDL, LDLDIRECT,  in the last 72 hours No results found for this basename: TSH, T4TOTAL, FREET3, T3FREE, THYROIDAB,  in the last 72 hours No results found for this basename: VITAMINB12, FOLATE, FERRITIN, TIBC, IRON, RETICCTPCT,  in the  last 72 hours Coags: No results found for this basename: PT, INR,  in the last 72 hours Microbiology: Recent Results (from the past 240 hour(s))  URINE CULTURE     Status: None   Collection Time    11/28/13 11:14 AM      Result Value Ref Range Status   Specimen Description URINE, CATHETERIZED   Final   Special Requests NONE   Final   Culture  Setup Time     Final   Value: 11/28/2013 17:11     Performed at [facility]     Final   Value: 50,000 COLONIES/ML     Performed at [facility]   Culture     Final   Value: ENTEROCOCCUS SPECIES     Performed at [facility]   Report Status 12/01/2013 FINAL   Final   Organism ID, Bacteria ENTEROCOCCUS SPECIES  Final     BRIEF HOSPITAL COURSE:   Principal Problem: Acute renal failure on chronic kidney disease stage III  -admitted, given supportive care with IVF, creatinine now significantly improved and close to usual baseline by day of discharge. No further recommendations of nephrology-ok to discharge. Will need close outpatient monitoring of electrolytes -Likely due to UTI, poor oral intake, and underlying urological issues   ?Malfunctioning Right Nephrostomy Tube  -Renal US- shows right hydronephrosis, per patient- tube not draining like usual, IR evaluated with nephrostogram-Tube in right position and functioning well. Per IR, if needs to be changed, then can be done in the outpatient settng. Appreciate Urology eval. Patient indicates he will follow up with primary Urologist in [place] in 2 weeks.  Metabolic acidosis  -suspect secondary to ARF/Neobladder  -improved on IV bicarb-transitioned to Oracit -continue on discharge  Hypokalemia  -repleted-please check lytes closely as outpatient  UTI with Pyelonephritis  Last admission- Enterococcus. He was discharged on Amoxacillin not sensitive to Ampicillin. Discussed case with Dr Comer-recommended Zyvox (8/26) For 2 weeks - better coverage for  enterococcus. Repeat Urine culture this admit positive for enterococcus as well.  History of Bladder cancer and Right Nephroureteral Stent and Nephrostomy tube  -Monitor urine output closely-nephrostomy tube ?malfunctioning-see above. Patient will follow with primary urologist in [place]   Anemia of chronic kidney disease  Hgb 11( 8/26)  Continue to monitor  outpatient follow up   Hypercalcemia  Resolved  Dehydration likely cause   GERD  Continue home regimen of pantoperazole   Generalized Weakness/Fatigue  Secondary to UTI, ARF-resolved. Now ambulating independently in room and in hallway  History of anxiety and depression:  -Stable. Will stop Celexa as Zyvox being started.Can resume celexa when off Zyvox   TODAY-DAY OF DISCHARGE:  Subjective:   Aundre Peplow today has no headache,no chest abdominal pain,no new weakness tingling or numbness, feels much better wants to go home today.   Objective:   Blood pressure 113/67, pulse 75, temperature 98 F (36.7 C), temperature source Oral, resp. rate 12, height 5\' 10"  (1.778 m), weight 78.654 kg (173 lb 6.4 oz), SpO2 99.00%.  Intake/Output Summary (Last 24 hours) at 12/01/13 1131 Last data filed at 12/01/13 0900  Gross per 24 hour  Intake 2252.92 ml  Output    350 ml  Net 1902.92 ml   Filed Weights   11/29/13 0504 11/30/13 0558 12/01/13 0611  Weight: 75 kg (165 lb 5.5 oz) 78.2 kg (172 lb 6.4 oz) 78.654 kg (173 lb 6.4 oz)    Exam Awake Alert, Oriented *3, No new F.N deficits, Normal affect [place].AT,PERRAL Supple Neck,No JVD, No cervical lymphadenopathy appriciated.  Symmetrical Chest wall movement, Good air movement bilaterally, CTAB RRR,No Gallops,Rubs or new Murmurs, No Parasternal Heave +ve B.Sounds, Abd Soft, Non tender, No organomegaly appriciated, No rebound -guarding or rigidity. No Cyanosis, Clubbing or edema, No new Rash or bruise  DISCHARGE CONDITION: Stable  DISPOSITION: Home  DISCHARGE INSTRUCTIONS:      Activity:  As tolerated   Diet recommendation: Regular Diet      Discharge Instructions   Call MD for:  persistant nausea and vomiting    Complete by:  As directed      Diet general    Complete by:  As directed      Increase activity slowly    Complete by:  As directed            Follow-up Information   Follow up with Florina Ou, MD. Schedule an  appointment as soon as possible for a visit in 1 week.   Specialty:  Family Medicine   Contact information:   [address] [place] [address] [phone]       Follow up with GOLDSBOROUGH,KELLIE A, MD. Schedule an appointment as soon as possible for a visit in 1 week.   Specialty:  Nephrology   Contact information:   [place] [address] [phone]       Follow up with Urology at [facility]. (please keep next appt)       Total Time spent on discharge equals 45 minutes.  SignedOren Binet 12/01/2013 11:31 AM  **Disclaimer: This note may have been dictated with voice recognition software. Similar sounding words can inadvertently be transcribed and this note may contain transcription errors which may not have been corrected upon publication of note.**

## 2013-12-04 ENCOUNTER — Encounter (HOSPITAL_COMMUNITY)
Admission: RE | Admit: 2013-12-04 | Discharge: 2013-12-04 | Disposition: A | Discharge status: Home / Self Care | Payer: BC Managed Care – PPO | Source: Ambulatory Visit | Attending: Nephrology | Admitting: Nephrology

## 2013-12-04 DIAGNOSIS — D638 Anemia in other chronic diseases classified elsewhere: Secondary | ICD-10-CM | POA: Insufficient documentation

## 2013-12-04 DIAGNOSIS — N189 Chronic kidney disease, unspecified: Secondary | ICD-10-CM | POA: Insufficient documentation

## 2013-12-04 LAB — IRON AND TIBC
Iron: 62 ug/dL (ref 42–135)
SATURATION RATIOS: 27 % (ref 20–55)
TIBC: 228 ug/dL (ref 215–435)
UIBC: 166 ug/dL (ref 125–400)

## 2013-12-04 LAB — RENAL FUNCTION PANEL
Albumin: 3.3 g/dL — ABNORMAL LOW (ref 3.5–5.2)
Anion gap: 11 (ref 5–15)
BUN: 42 mg/dL — ABNORMAL HIGH (ref 6–23)
CO2: 22 mEq/L (ref 19–32)
Calcium: 9.7 mg/dL (ref 8.4–10.5)
Chloride: 106 mEq/L (ref 96–112)
Creatinine, Ser: 2.3 mg/dL — ABNORMAL HIGH (ref 0.50–1.35)
GFR calc non Af Amer: 30 mL/min — ABNORMAL LOW (ref >90)
GFR, EST AFRICAN AMERICAN: 35 mL/min — AB (ref >90)
Glucose, Bld: 150 mg/dL — ABNORMAL HIGH (ref 70–99)
PHOSPHORUS: 2.9 mg/dL (ref 2.3–4.6)
POTASSIUM: 4.6 meq/L (ref 3.7–5.3)
SODIUM: 139 meq/L (ref 137–147)

## 2013-12-04 LAB — FERRITIN: Ferritin: 267 ng/mL (ref 22–322)

## 2013-12-04 LAB — POCT HEMOGLOBIN-HEMACUE: Hemoglobin: 10.6 g/dL — ABNORMAL LOW (ref 13.0–17.0)

## 2013-12-04 MED ORDER — EPOETIN ALFA 20000 UNIT/ML IJ SOLN
20000.0000 [IU] | INTRAMUSCULAR | Status: DC
  Administered 2013-12-04: 20000 [IU] via SUBCUTANEOUS

## 2013-12-04 MED ORDER — EPOETIN ALFA 20000 UNIT/ML IJ SOLN
INTRAMUSCULAR | Status: AC
  Filled 2013-12-04: qty 1

## 2013-12-05 LAB — PTH, INTACT AND CALCIUM
Calcium, Total (PTH): 9.5 mg/dL (ref 8.4–10.5)
PTH: 11 pg/mL — ABNORMAL LOW (ref 14–64)

## 2013-12-24 ENCOUNTER — Other Ambulatory Visit (HOSPITAL_COMMUNITY): Payer: Self-pay | Admitting: *Deleted

## 2013-12-25 ENCOUNTER — Encounter (HOSPITAL_COMMUNITY)
Admission: RE | Admit: 2013-12-25 | Discharge: 2013-12-25 | Disposition: A | Discharge status: Home / Self Care | Payer: BC Managed Care – PPO | Source: Ambulatory Visit | Attending: Nephrology | Admitting: Nephrology

## 2013-12-25 DIAGNOSIS — D638 Anemia in other chronic diseases classified elsewhere: Secondary | ICD-10-CM | POA: Diagnosis not present

## 2013-12-25 LAB — POCT HEMOGLOBIN-HEMACUE: Hemoglobin: 11.1 g/dL — ABNORMAL LOW (ref 13.0–17.0)

## 2013-12-25 MED ORDER — EPOETIN ALFA 20000 UNIT/ML IJ SOLN
20000.0000 [IU] | INTRAMUSCULAR | Status: DC
  Administered 2013-12-25: 20000 [IU] via SUBCUTANEOUS

## 2013-12-25 MED ORDER — EPOETIN ALFA 20000 UNIT/ML IJ SOLN
INTRAMUSCULAR | Status: AC
  Filled 2013-12-25: qty 1

## 2013-12-25 MED ORDER — SODIUM CHLORIDE 0.9 % IV SOLN
1020.0000 mg | Freq: Once | INTRAVENOUS | Status: AC
  Administered 2013-12-25: 1020 mg via INTRAVENOUS
  Filled 2013-12-25: qty 34

## 2013-12-28 ENCOUNTER — Inpatient Hospital Stay (HOSPITAL_COMMUNITY): Admission: RE | Admit: 2013-12-28 | Payer: BC Managed Care – PPO | Source: Ambulatory Visit

## 2014-01-09 ENCOUNTER — Other Ambulatory Visit: Payer: Self-pay | Admitting: Nephrology

## 2014-01-09 DIAGNOSIS — N189 Chronic kidney disease, unspecified: Secondary | ICD-10-CM

## 2014-01-11 ENCOUNTER — Ambulatory Visit
Admission: RE | Admit: 2014-01-11 | Discharge: 2014-01-11 | Disposition: A | Discharge status: Home / Self Care | Payer: BC Managed Care – PPO | Source: Ambulatory Visit | Attending: Nephrology | Admitting: Nephrology

## 2014-01-11 DIAGNOSIS — N189 Chronic kidney disease, unspecified: Secondary | ICD-10-CM

## 2014-01-14 ENCOUNTER — Other Ambulatory Visit (HOSPITAL_COMMUNITY): Payer: Self-pay | Admitting: *Deleted

## 2014-01-15 ENCOUNTER — Encounter (HOSPITAL_COMMUNITY)
Admission: RE | Admit: 2014-01-15 | Discharge: 2014-01-15 | Disposition: A | Discharge status: Home / Self Care | Payer: BC Managed Care – PPO | Source: Ambulatory Visit | Attending: Nephrology | Admitting: Nephrology

## 2014-01-15 DIAGNOSIS — D638 Anemia in other chronic diseases classified elsewhere: Secondary | ICD-10-CM | POA: Diagnosis present

## 2014-01-15 DIAGNOSIS — N189 Chronic kidney disease, unspecified: Secondary | ICD-10-CM | POA: Insufficient documentation

## 2014-01-15 LAB — RENAL FUNCTION PANEL
Albumin: 3.8 g/dL (ref 3.5–5.2)
Anion gap: 12 (ref 5–15)
BUN: 62 mg/dL — AB (ref 6–23)
CHLORIDE: 110 meq/L (ref 96–112)
CO2: 16 mEq/L — ABNORMAL LOW (ref 19–32)
Calcium: 10.1 mg/dL (ref 8.4–10.5)
Creatinine, Ser: 2.29 mg/dL — ABNORMAL HIGH (ref 0.50–1.35)
GFR calc Af Amer: 35 mL/min — ABNORMAL LOW (ref >90)
GFR, EST NON AFRICAN AMERICAN: 30 mL/min — AB (ref >90)
GLUCOSE: 118 mg/dL — AB (ref 70–99)
Phosphorus: 3.5 mg/dL (ref 2.3–4.6)
Potassium: 4.1 mEq/L (ref 3.7–5.3)
Sodium: 138 mEq/L (ref 137–147)

## 2014-01-15 LAB — FERRITIN: Ferritin: 796 ng/mL — ABNORMAL HIGH (ref 22–322)

## 2014-01-15 LAB — IRON AND TIBC
IRON: 68 ug/dL (ref 42–135)
Saturation Ratios: 32 % (ref 20–55)
TIBC: 215 ug/dL (ref 215–435)
UIBC: 147 ug/dL (ref 125–400)

## 2014-01-15 LAB — POCT HEMOGLOBIN-HEMACUE: Hemoglobin: 11.1 g/dL — ABNORMAL LOW (ref 13.0–17.0)

## 2014-01-15 MED ORDER — EPOETIN ALFA 20000 UNIT/ML IJ SOLN
INTRAMUSCULAR | Status: AC
  Administered 2014-01-15: 20000 [IU] via SUBCUTANEOUS
  Filled 2014-01-15: qty 1

## 2014-01-15 MED ORDER — EPOETIN ALFA 20000 UNIT/ML IJ SOLN: 20000.0000 [IU] | INTRAMUSCULAR | Status: DC

## 2014-01-16 LAB — PTH, INTACT AND CALCIUM
CALCIUM TOTAL (PTH): 9.9 mg/dL (ref 8.4–10.5)
PTH: 9 pg/mL — ABNORMAL LOW (ref 14–64)

## 2014-02-05 ENCOUNTER — Encounter (HOSPITAL_COMMUNITY)
Admission: RE | Admit: 2014-02-05 | Discharge: 2014-02-05 | Disposition: A | Discharge status: Home / Self Care | Payer: BC Managed Care – PPO | Source: Ambulatory Visit | Attending: Nephrology | Admitting: Nephrology

## 2014-02-05 DIAGNOSIS — N189 Chronic kidney disease, unspecified: Secondary | ICD-10-CM | POA: Diagnosis not present

## 2014-02-05 DIAGNOSIS — D638 Anemia in other chronic diseases classified elsewhere: Secondary | ICD-10-CM | POA: Diagnosis present

## 2014-02-05 LAB — POCT HEMOGLOBIN-HEMACUE: HEMOGLOBIN: 11.3 g/dL — AB (ref 13.0–17.0)

## 2014-02-05 MED ORDER — EPOETIN ALFA 20000 UNIT/ML IJ SOLN
20000.0000 [IU] | INTRAMUSCULAR | Status: DC
  Administered 2014-02-05: 20000 [IU] via SUBCUTANEOUS

## 2014-02-05 MED ORDER — EPOETIN ALFA 20000 UNIT/ML IJ SOLN
INTRAMUSCULAR | Status: AC
Start: 2014-02-05 — End: 2014-02-05
  Filled 2014-02-05: qty 1

## 2014-02-26 ENCOUNTER — Encounter (HOSPITAL_COMMUNITY)
Admission: RE | Admit: 2014-02-26 | Discharge: 2014-02-26 | Disposition: A | Discharge status: Home / Self Care | Payer: BC Managed Care – PPO | Source: Ambulatory Visit | Attending: Nephrology | Admitting: Nephrology

## 2014-02-26 DIAGNOSIS — D638 Anemia in other chronic diseases classified elsewhere: Secondary | ICD-10-CM | POA: Diagnosis not present

## 2014-02-26 LAB — POCT HEMOGLOBIN-HEMACUE: Hemoglobin: 12.4 g/dL — ABNORMAL LOW (ref 13.0–17.0)

## 2014-02-26 LAB — RENAL FUNCTION PANEL
Albumin: 3.8 g/dL (ref 3.5–5.2)
Anion gap: 16 — ABNORMAL HIGH (ref 5–15)
BUN: 60 mg/dL — ABNORMAL HIGH (ref 6–23)
CO2: 10 mEq/L — CL (ref 19–32)
CREATININE: 2.78 mg/dL — AB (ref 0.50–1.35)
Calcium: 9.8 mg/dL (ref 8.4–10.5)
Chloride: 111 mEq/L (ref 96–112)
GFR, EST AFRICAN AMERICAN: 28 mL/min — AB (ref >90)
GFR, EST NON AFRICAN AMERICAN: 24 mL/min — AB (ref >90)
Glucose, Bld: 128 mg/dL — ABNORMAL HIGH (ref 70–99)
Phosphorus: 4.2 mg/dL (ref 2.3–4.6)
Potassium: 4.1 mEq/L (ref 3.7–5.3)
Sodium: 137 mEq/L (ref 137–147)

## 2014-02-26 LAB — IRON AND TIBC
Iron: 108 ug/dL (ref 42–135)
Saturation Ratios: 57 % — ABNORMAL HIGH (ref 20–55)
TIBC: 190 ug/dL — ABNORMAL LOW (ref 215–435)
UIBC: 82 ug/dL — ABNORMAL LOW (ref 125–400)

## 2014-02-26 LAB — FERRITIN: FERRITIN: 623 ng/mL — AB (ref 22–322)

## 2014-02-26 MED ORDER — EPOETIN ALFA 20000 UNIT/ML IJ SOLN: 20000.0000 [IU] | INTRAMUSCULAR | Status: DC

## 2014-02-27 LAB — PTH, INTACT AND CALCIUM
Calcium, Total (PTH): 9.4 mg/dL (ref 8.4–10.5)
PTH: 8 pg/mL — ABNORMAL LOW (ref 14–64)

## 2014-03-12 ENCOUNTER — Encounter (HOSPITAL_COMMUNITY): Payer: BC Managed Care – PPO

## 2014-03-25 ENCOUNTER — Other Ambulatory Visit (HOSPITAL_COMMUNITY): Payer: Self-pay | Admitting: *Deleted

## 2014-03-26 ENCOUNTER — Encounter (HOSPITAL_COMMUNITY)
Admission: RE | Admit: 2014-03-26 | Discharge: 2014-03-26 | Disposition: A | Discharge status: Home / Self Care | Payer: BC Managed Care – PPO | Source: Ambulatory Visit | Attending: Nephrology | Admitting: Nephrology

## 2014-03-26 DIAGNOSIS — N189 Chronic kidney disease, unspecified: Secondary | ICD-10-CM | POA: Diagnosis not present

## 2014-03-26 DIAGNOSIS — D638 Anemia in other chronic diseases classified elsewhere: Secondary | ICD-10-CM | POA: Diagnosis present

## 2014-03-26 LAB — RENAL FUNCTION PANEL
Albumin: 3.5 g/dL (ref 3.5–5.2)
Anion gap: 7 (ref 5–15)
BUN: 49 mg/dL — ABNORMAL HIGH (ref 6–23)
CALCIUM: 9.8 mg/dL (ref 8.4–10.5)
CO2: 16 mmol/L — AB (ref 19–32)
Chloride: 114 mEq/L — ABNORMAL HIGH (ref 96–112)
Creatinine, Ser: 2.72 mg/dL — ABNORMAL HIGH (ref 0.50–1.35)
GFR calc Af Amer: 29 mL/min — ABNORMAL LOW (ref >90)
GFR, EST NON AFRICAN AMERICAN: 25 mL/min — AB (ref >90)
Glucose, Bld: 164 mg/dL — ABNORMAL HIGH (ref 70–99)
Phosphorus: 4.1 mg/dL (ref 2.3–4.6)
Potassium: 4 mmol/L (ref 3.5–5.1)
Sodium: 137 mmol/L (ref 135–145)

## 2014-03-26 LAB — IRON AND TIBC
IRON: 69 ug/dL (ref 42–135)
SATURATION RATIOS: 36 % (ref 20–55)
TIBC: 191 ug/dL — ABNORMAL LOW (ref 215–435)
UIBC: 122 ug/dL — ABNORMAL LOW (ref 125–400)

## 2014-03-26 LAB — POCT HEMOGLOBIN-HEMACUE: HEMOGLOBIN: 10.5 g/dL — AB (ref 13.0–17.0)

## 2014-03-26 LAB — FERRITIN: FERRITIN: 825 ng/mL — AB (ref 22–322)

## 2014-03-26 MED ORDER — EPOETIN ALFA 20000 UNIT/ML IJ SOLN
20000.0000 [IU] | INTRAMUSCULAR | Status: DC
  Administered 2014-03-26: 20000 [IU] via SUBCUTANEOUS

## 2014-03-26 MED ORDER — EPOETIN ALFA 20000 UNIT/ML IJ SOLN
INTRAMUSCULAR | Status: AC
  Filled 2014-03-26: qty 1

## 2014-03-27 LAB — PTH, INTACT AND CALCIUM
CALCIUM TOTAL (PTH): 9.8 mg/dL (ref 8.4–10.5)
PTH: 9 pg/mL — ABNORMAL LOW (ref 14–64)

## 2014-04-23 ENCOUNTER — Inpatient Hospital Stay (HOSPITAL_COMMUNITY): Admission: RE | Admit: 2014-04-23 | Payer: BC Managed Care – PPO | Source: Ambulatory Visit

## 2014-05-06 ENCOUNTER — Encounter (HOSPITAL_COMMUNITY)
Admission: RE | Admit: 2014-05-06 | Discharge: 2014-05-06 | Disposition: A | Discharge status: Home / Self Care | Payer: BC Managed Care – PPO | Source: Ambulatory Visit | Attending: Nephrology | Admitting: Nephrology

## 2014-05-06 DIAGNOSIS — D638 Anemia in other chronic diseases classified elsewhere: Secondary | ICD-10-CM | POA: Diagnosis present

## 2014-05-06 DIAGNOSIS — N189 Chronic kidney disease, unspecified: Secondary | ICD-10-CM | POA: Insufficient documentation

## 2014-05-06 LAB — RENAL FUNCTION PANEL
Albumin: 3.7 g/dL (ref 3.5–5.2)
Anion gap: 5 (ref 5–15)
BUN: 49 mg/dL — ABNORMAL HIGH (ref 6–23)
CO2: 20 mmol/L (ref 19–32)
CREATININE: 2.23 mg/dL — AB (ref 0.50–1.35)
Calcium: 9.5 mg/dL (ref 8.4–10.5)
Chloride: 115 mmol/L — ABNORMAL HIGH (ref 96–112)
GFR, EST AFRICAN AMERICAN: 36 mL/min — AB (ref >90)
GFR, EST NON AFRICAN AMERICAN: 31 mL/min — AB (ref >90)
Glucose, Bld: 138 mg/dL — ABNORMAL HIGH (ref 70–99)
PHOSPHORUS: 2.3 mg/dL (ref 2.3–4.6)
POTASSIUM: 3.7 mmol/L (ref 3.5–5.1)
SODIUM: 140 mmol/L (ref 135–145)

## 2014-05-06 LAB — IRON AND TIBC
Iron: 55 ug/dL (ref 42–165)
SATURATION RATIOS: 26 % (ref 20–55)
TIBC: 209 ug/dL — ABNORMAL LOW (ref 215–435)
UIBC: 154 ug/dL (ref 125–400)

## 2014-05-06 LAB — POCT HEMOGLOBIN-HEMACUE: HEMOGLOBIN: 9.9 g/dL — AB (ref 13.0–17.0)

## 2014-05-06 MED ORDER — EPOETIN ALFA 20000 UNIT/ML IJ SOLN: 20000.0000 [IU] | INTRAMUSCULAR | Status: DC

## 2014-05-06 MED ORDER — EPOETIN ALFA 20000 UNIT/ML IJ SOLN
INTRAMUSCULAR | Status: AC
  Administered 2014-05-06: 12:00:00
  Filled 2014-05-06: qty 1

## 2014-05-07 ENCOUNTER — Other Ambulatory Visit: Payer: Self-pay | Admitting: *Deleted

## 2014-05-07 DIAGNOSIS — N179 Acute kidney failure, unspecified: Secondary | ICD-10-CM

## 2014-05-07 DIAGNOSIS — Z01810 Encounter for preprocedural cardiovascular examination: Secondary | ICD-10-CM

## 2014-05-07 DIAGNOSIS — N189 Chronic kidney disease, unspecified: Secondary | ICD-10-CM

## 2014-05-07 LAB — PTH, INTACT AND CALCIUM
CALCIUM TOTAL (PTH): 9.2 mg/dL (ref 8.7–10.2)
PTH: 11 pg/mL — AB (ref 15–65)

## 2014-05-07 LAB — FERRITIN: FERRITIN: 519 ng/mL — AB (ref 22–322)

## 2014-05-27 ENCOUNTER — Encounter: Payer: Self-pay | Admitting: Vascular Surgery

## 2014-05-28 ENCOUNTER — Ambulatory Visit (INDEPENDENT_AMBULATORY_CARE_PROVIDER_SITE_OTHER)
Admission: RE | Admit: 2014-05-28 | Discharge: 2014-05-28 | Disposition: A | Discharge status: Home / Self Care | Payer: BC Managed Care – PPO | Source: Ambulatory Visit | Attending: Vascular Surgery | Admitting: Vascular Surgery

## 2014-05-28 ENCOUNTER — Encounter: Payer: Self-pay | Admitting: Vascular Surgery

## 2014-05-28 ENCOUNTER — Other Ambulatory Visit: Payer: Self-pay

## 2014-05-28 ENCOUNTER — Ambulatory Visit (HOSPITAL_COMMUNITY)
Admission: RE | Admit: 2014-05-28 | Discharge: 2014-05-28 | Disposition: A | Discharge status: Home / Self Care | Payer: BC Managed Care – PPO | Source: Ambulatory Visit | Attending: Vascular Surgery | Admitting: Vascular Surgery

## 2014-05-28 ENCOUNTER — Ambulatory Visit (INDEPENDENT_AMBULATORY_CARE_PROVIDER_SITE_OTHER): Payer: BC Managed Care – PPO | Admitting: Vascular Surgery

## 2014-05-28 VITALS — BP 110/70 | HR 91 | Resp 16 | Ht 70.0 in | Wt 179.0 lb

## 2014-05-28 DIAGNOSIS — N179 Acute kidney failure, unspecified: Secondary | ICD-10-CM

## 2014-05-28 DIAGNOSIS — Z01810 Encounter for preprocedural cardiovascular examination: Secondary | ICD-10-CM

## 2014-05-28 DIAGNOSIS — N189 Chronic kidney disease, unspecified: Secondary | ICD-10-CM

## 2014-05-28 DIAGNOSIS — N184 Chronic kidney disease, stage 4 (severe): Secondary | ICD-10-CM

## 2014-05-28 NOTE — Progress Notes (Signed)
Subjective:     Patient ID: [name] [name], male   DOB: [birth year], 54 y.o.   MRN: [id number]  HPI this 54 year old male is evaluated for vascular access. He has had a previous cystectomy with an ilial conduit. Since his surgery he has had progressive renal dysfunction and will likely require hemodialysis later this year. He is left handed. He did have an injury to his right hand in the past which left him with some numbness in the third fourth and fifth fingers and hypersensitivity to cold. He has never been on hemodialysis in the past.  Past Medical History  Diagnosis Date  . Gout     recent flair -bilateral feet--  STABLE  . Frequency of urination   . Urgency incontinence   . Nocturia   . Diabetes mellitus type 2, diet-controlled PT STOPPED TAKING METFORMIN--  HAS BEEN WATCHING DIET, EXERCISING AND LOSING WT  . Bladder cancer 2013  . Complication of anesthesia     agitation w/awakening in 9/13,OK 01/24/12  . Hypertension   . Anxiety   . Depression   . GERD (gastroesophageal reflux disease)   . Acute renal injury 11/2013    History  Substance Use Topics  . Smoking status: Former Smoker -- 1.00 packs/day for 16 years    Types: Cigarettes    Quit date: 01/20/1992  . Smokeless tobacco: Former Systems developer    Types: [place] date: 01/20/1992  . Alcohol Use: No    Family History  Problem Relation Age of Onset  . Diabetes Mellitus II Other   . Hypertension Other   . Bladder Cancer Neg Hx     Allergies  Allergen Reactions  . Lisinopril Cough     Current outpatient prescriptions:  .  citalopram (CELEXA) 40 MG tablet, Take 40 mg by mouth daily., Disp: , Rfl:  .  Nutritional Supplements (FEEDING SUPPLEMENT, NEPRO CARB STEADY,) LIQD, Take 237 mLs by mouth 3 (three) times daily between meals., Disp: 90 Can, Rfl: 0 .  omeprazole (PRILOSEC) 40 MG capsule, Take 1 capsule (40 mg total) by mouth 2 (two) times daily before a meal., Disp: 60 capsule, Rfl: 1 .  SODIUM BICARBONATE PO,  Take 625 mg by mouth., Disp: , Rfl:  .  citric acid-sodium citrate (ORACIT) 334-500 MG/5ML solution, Take 15 mLs by mouth 4 (four) times daily - after meals and at bedtime. (Patient not taking: Reported on 05/28/2014), Disp: 473 mL, Rfl: 0 .  linezolid (ZYVOX) 600 MG tablet, Take 1 tablet (600 mg total) by mouth every 12 (twelve) hours. (Patient not taking: Reported on 05/28/2014), Disp: 22 tablet, Rfl: 0  BP 110/70 mmHg  Pulse 91  Resp 16  Ht 5\' 10"  (1.778 m)  Wt 179 lb (81.194 kg)  BMI 25.68 kg/m2  Body mass index is 25.68 kg/(m^2).         Review of Systems denies chest pain, dyspnea on exertion, PND, orthopnea, claudication. Does have history of hypertension and diabetes mellitus. Other systems negative and complete review of systems     Objective:   Physical Exam BP 110/70 mmHg  Pulse 91  Resp 16  Ht 5\' 10"  (1.778 m)  Wt 179 lb (81.194 kg)  BMI 25.68 kg/m2  Gen.-alert and oriented x3 in no apparent distress HEENT normal for age Lungs no rhonchi or wheezing Cardiovascular regular rhythm no murmurs carotid pulses 3+ palpable no bruits audible Abdomen soft nontender no palpable masses Musculoskeletal free of  major deformities Skin clear -no rashes  Neurologic normal Lower extremities 3+ femoral and dorsalis pedis pulses palpable bilaterally with no edema Left upper extremity with 3+ brachial and 2+ radial ulnar pulse palpable with well-perfused left hand Right upper extremity has 2+ radial pulse palpable but pale appearing hand with slight decreased sensation  Today I ordered vein mapping of the upper extremities which reveals a good caliber cephalic vein in the left arm and a good caliber basilic vein. Right upper extremity has a borderline basilic vein but inadequate cephalic vein. Arterial study was performed which revealed triphasic flow in the radial ulnar artery at the wrist and the left arm.      Assessment:     Chronic kidney disease-stage IV needs vascular  access    Plan:     Plan left radial cephalic or brachial cephalic AV fistula on Friday, February 26 Discussed potential for steal syndrome with patient Patient would like to proceed and questions were answered

## 2014-05-30 ENCOUNTER — Encounter (HOSPITAL_COMMUNITY): Payer: Self-pay | Admitting: *Deleted

## 2014-05-30 MED ORDER — DEXTROSE 5 % IV SOLN
1.5000 g | INTRAVENOUS | Status: AC
  Administered 2014-05-31: 1.5 g via INTRAVENOUS
  Filled 2014-05-30: qty 1.5

## 2014-05-30 MED ORDER — CHLORHEXIDINE GLUCONATE CLOTH 2 % EX PADS: 6.0000 | MEDICATED_PAD | Freq: Once | CUTANEOUS | Status: DC

## 2014-05-30 MED ORDER — SODIUM CHLORIDE 0.9 % IV SOLN: INTRAVENOUS | Status: DC

## 2014-05-30 NOTE — Progress Notes (Signed)
Pt denies SOB, chest pain, and being under the care of a cardiologist. Pt denies having a stress test, echo and cardiac cath. Pt made aware to not take any otc vitamins, herbal medications and NSAIDS. Pt verbalized understanding.

## 2014-05-31 ENCOUNTER — Other Ambulatory Visit: Payer: Self-pay | Admitting: *Deleted

## 2014-05-31 ENCOUNTER — Encounter (HOSPITAL_COMMUNITY)
Admission: RE | Disposition: A | Discharge status: Home / Self Care | Payer: Self-pay | Source: Ambulatory Visit | Attending: Vascular Surgery

## 2014-05-31 ENCOUNTER — Ambulatory Visit (HOSPITAL_COMMUNITY): Payer: BC Managed Care – PPO | Admitting: Anesthesiology

## 2014-05-31 ENCOUNTER — Ambulatory Visit (HOSPITAL_COMMUNITY)
Admission: RE | Admit: 2014-05-31 | Discharge: 2014-05-31 | Disposition: A | Discharge status: Home / Self Care | Payer: BC Managed Care – PPO | Source: Ambulatory Visit | Attending: Vascular Surgery | Admitting: Vascular Surgery

## 2014-05-31 ENCOUNTER — Encounter (HOSPITAL_COMMUNITY): Payer: Self-pay | Admitting: *Deleted

## 2014-05-31 DIAGNOSIS — Z4931 Encounter for adequacy testing for hemodialysis: Secondary | ICD-10-CM

## 2014-05-31 DIAGNOSIS — Z87891 Personal history of nicotine dependence: Secondary | ICD-10-CM | POA: Insufficient documentation

## 2014-05-31 DIAGNOSIS — F329 Major depressive disorder, single episode, unspecified: Secondary | ICD-10-CM | POA: Diagnosis not present

## 2014-05-31 DIAGNOSIS — N186 End stage renal disease: Secondary | ICD-10-CM

## 2014-05-31 DIAGNOSIS — K219 Gastro-esophageal reflux disease without esophagitis: Secondary | ICD-10-CM | POA: Insufficient documentation

## 2014-05-31 DIAGNOSIS — Z8551 Personal history of malignant neoplasm of bladder: Secondary | ICD-10-CM | POA: Diagnosis not present

## 2014-05-31 DIAGNOSIS — M109 Gout, unspecified: Secondary | ICD-10-CM | POA: Diagnosis not present

## 2014-05-31 DIAGNOSIS — I120 Hypertensive chronic kidney disease with stage 5 chronic kidney disease or end stage renal disease: Secondary | ICD-10-CM | POA: Insufficient documentation

## 2014-05-31 DIAGNOSIS — F419 Anxiety disorder, unspecified: Secondary | ICD-10-CM | POA: Insufficient documentation

## 2014-05-31 DIAGNOSIS — Z888 Allergy status to other drugs, medicaments and biological substances status: Secondary | ICD-10-CM | POA: Diagnosis not present

## 2014-05-31 DIAGNOSIS — E119 Type 2 diabetes mellitus without complications: Secondary | ICD-10-CM | POA: Insufficient documentation

## 2014-05-31 HISTORY — DX: Anemia, unspecified: D64.9

## 2014-05-31 HISTORY — PX: AV FISTULA PLACEMENT: SHX1204

## 2014-05-31 LAB — GLUCOSE, CAPILLARY
Glucose-Capillary: 87 mg/dL (ref 70–99)
Glucose-Capillary: 111 mg/dL — ABNORMAL HIGH (ref 70–99)

## 2014-05-31 LAB — POCT I-STAT 4, (NA,K, GLUC, HGB,HCT)
Glucose, Bld: 106 mg/dL — ABNORMAL HIGH (ref 70–99)
HCT: 33 % — ABNORMAL LOW (ref 39.0–52.0)
Hemoglobin: 11.2 g/dL — ABNORMAL LOW (ref 13.0–17.0)
POTASSIUM: 4 mmol/L (ref 3.5–5.1)
SODIUM: 140 mmol/L (ref 135–145)

## 2014-05-31 SURGERY — ARTERIOVENOUS (AV) FISTULA CREATION
Anesthesia: Monitor Anesthesia Care | Site: Arm Lower | Laterality: Left

## 2014-05-31 MED ORDER — MIDAZOLAM HCL 5 MG/5ML IJ SOLN
INTRAMUSCULAR | Status: DC | PRN
  Administered 2014-05-31 (×2): 1 mg via INTRAVENOUS

## 2014-05-31 MED ORDER — OXYCODONE-ACETAMINOPHEN 5-325 MG PO TABS: 1.0000 | ORAL_TABLET | Freq: Four times a day (QID) | ORAL | Status: DC | PRN

## 2014-05-31 MED ORDER — LIDOCAINE-EPINEPHRINE (PF) 1 %-1:200000 IJ SOLN
INTRAMUSCULAR | Status: DC | PRN
  Administered 2014-05-31: 10 mL

## 2014-05-31 MED ORDER — FENTANYL CITRATE 0.05 MG/ML IJ SOLN
INTRAMUSCULAR | Status: AC
  Filled 2014-05-31: qty 5

## 2014-05-31 MED ORDER — DIPHENHYDRAMINE HCL 50 MG/ML IJ SOLN
INTRAMUSCULAR | Status: DC | PRN
  Administered 2014-05-31: 12.5 mg via INTRAVENOUS

## 2014-05-31 MED ORDER — PROPOFOL 10 MG/ML IV BOLUS
INTRAVENOUS | Status: AC
  Filled 2014-05-31: qty 20

## 2014-05-31 MED ORDER — MIDAZOLAM HCL 2 MG/2ML IJ SOLN
INTRAMUSCULAR | Status: AC
  Filled 2014-05-31: qty 2

## 2014-05-31 MED ORDER — LIDOCAINE-EPINEPHRINE (PF) 1 %-1:200000 IJ SOLN
INTRAMUSCULAR | Status: AC
  Filled 2014-05-31: qty 10

## 2014-05-31 MED ORDER — SODIUM CHLORIDE 0.9 % IV SOLN
INTRAVENOUS | Status: DC | PRN
  Administered 2014-05-31 (×2): via INTRAVENOUS

## 2014-05-31 MED ORDER — HEPARIN SODIUM (PORCINE) 5000 UNIT/ML IJ SOLN
INTRAMUSCULAR | Status: DC | PRN
  Administered 2014-05-31: 500 mL

## 2014-05-31 MED ORDER — PROPOFOL 10 MG/ML IV BOLUS
INTRAVENOUS | Status: DC | PRN
  Administered 2014-05-31 (×4): 10 mg via INTRAVENOUS

## 2014-05-31 MED ORDER — FENTANYL CITRATE 0.05 MG/ML IJ SOLN
INTRAMUSCULAR | Status: DC | PRN
  Administered 2014-05-31 (×3): 50 ug via INTRAVENOUS
  Administered 2014-05-31: 25 ug via INTRAVENOUS
  Administered 2014-05-31: 50 ug via INTRAVENOUS
  Administered 2014-05-31: 25 ug via INTRAVENOUS

## 2014-05-31 MED ORDER — 0.9 % SODIUM CHLORIDE (POUR BTL) OPTIME
TOPICAL | Status: DC | PRN
  Administered 2014-05-31: 1000 mL

## 2014-05-31 SURGICAL SUPPLY — 39 items
ARMBAND PINK RESTRICT EXTREMIT (MISCELLANEOUS) ×3 IMPLANT
BLADE SURG 10 STRL SS (BLADE) IMPLANT
CANISTER SUCTION 2500CC (MISCELLANEOUS) ×3 IMPLANT
CLIP TI MEDIUM 6 (CLIP) ×3 IMPLANT
CLIP TI WIDE RED SMALL 6 (CLIP) ×3 IMPLANT
COVER PROBE W GEL 5X96 (DRAPES) ×6 IMPLANT
COVER SURGICAL LIGHT HANDLE (MISCELLANEOUS) IMPLANT
DRAIN PENROSE 1/4X12 LTX STRL (WOUND CARE) ×3 IMPLANT
ELECT REM PT RETURN 9FT ADLT (ELECTROSURGICAL) ×3
ELECTRODE REM PT RTRN 9FT ADLT (ELECTROSURGICAL) ×1 IMPLANT
GEL ULTRASOUND 20GR AQUASONIC (MISCELLANEOUS) IMPLANT
GLOVE BIO SURGEON STRL SZ 6.5 (GLOVE) ×2 IMPLANT
GLOVE BIO SURGEONS STRL SZ 6.5 (GLOVE) ×1
GLOVE BIOGEL PI IND STRL 6.5 (GLOVE) ×3 IMPLANT
GLOVE BIOGEL PI IND STRL 7.0 (GLOVE) ×2 IMPLANT
GLOVE BIOGEL PI IND STRL 7.5 (GLOVE) ×1 IMPLANT
GLOVE BIOGEL PI INDICATOR 6.5 (GLOVE) ×6
GLOVE BIOGEL PI INDICATOR 7.0 (GLOVE) ×4
GLOVE BIOGEL PI INDICATOR 7.5 (GLOVE) ×2
GLOVE ECLIPSE 6.5 STRL STRAW (GLOVE) ×3 IMPLANT
GLOVE ECLIPSE 7.0 STRL STRAW (GLOVE) ×3 IMPLANT
GLOVE SS BIOGEL STRL SZ 7 (GLOVE) ×1 IMPLANT
GLOVE SUPERSENSE BIOGEL SZ 7 (GLOVE) ×2
GOWN STRL REUS W/ TWL LRG LVL3 (GOWN DISPOSABLE) ×3 IMPLANT
GOWN STRL REUS W/ TWL XL LVL3 (GOWN DISPOSABLE) ×1 IMPLANT
GOWN STRL REUS W/TWL LRG LVL3 (GOWN DISPOSABLE) ×9
GOWN STRL REUS W/TWL XL LVL3 (GOWN DISPOSABLE) ×3
KIT BASIN OR (CUSTOM PROCEDURE TRAY) ×3 IMPLANT
KIT ROOM TURNOVER OR (KITS) ×3 IMPLANT
LIQUID BAND (GAUZE/BANDAGES/DRESSINGS) ×3 IMPLANT
NS IRRIG 1000ML POUR BTL (IV SOLUTION) ×3 IMPLANT
PACK CV ACCESS (CUSTOM PROCEDURE TRAY) ×3 IMPLANT
PAD ARMBOARD 7.5X6 YLW CONV (MISCELLANEOUS) ×6 IMPLANT
PROBE PENCIL 8 MHZ STRL DISP (MISCELLANEOUS) IMPLANT
SUT PROLENE 6 0 BV (SUTURE) ×3 IMPLANT
SUT VIC AB 3-0 SH 27 (SUTURE) ×3
SUT VIC AB 3-0 SH 27X BRD (SUTURE) ×1 IMPLANT
UNDERPAD 30X30 INCONTINENT (UNDERPADS AND DIAPERS) ×6 IMPLANT
WATER STERILE IRR 1000ML POUR (IV SOLUTION) ×3 IMPLANT

## 2014-05-31 NOTE — Progress Notes (Signed)
Pt's bp 80s/40s-50s. Pt states this is not uncommon and that he feels perfectly fine. Dr.Moser notified. No new orders

## 2014-05-31 NOTE — Interval H&P Note (Signed)
History and Physical Interval Note:  05/31/2014 7:29 AM  [name] [name]  has presented today for surgery, with the diagnosis of End Stage Renal Disease  N18.6  The various methods of treatment have been discussed with the patient and family. After consideration of risks, benefits and other options for treatment, the patient has consented to  Procedure(s): LEFT RADIOCEPHALIC ARTERIOVENOUS (AV) FISTULA CREATION VS LEFT BRACHIOCEPHALIC ARTERIOVENOUS FISTULA CREATION (Left) as a surgical intervention .  The patient's history has been reviewed, patient examined, no change in status, stable for surgery.  I have reviewed the patient's chart and labs.  Questions were answered to the patient's satisfaction.     Tinnie Gens

## 2014-05-31 NOTE — Discharge Instructions (Signed)
What to eat: ° °For your first meals, you should eat lightly; only small meals initially.  If you do not have nausea, you may eat larger meals.  Avoid spicy, greasy and heavy food.   ° °General Anesthesia, Adult, Care After  °Refer to this sheet in the next few weeks. These instructions provide you with information on caring for yourself after your procedure. Your health care provider may also give you more specific instructions. Your treatment has been planned according to current medical practices, but problems sometimes occur. Call your health care provider if you have any problems or questions after your procedure.  °WHAT TO EXPECT AFTER THE PROCEDURE  °After the procedure, it is typical to experience:  °Sleepiness.  °Nausea and vomiting. °HOME CARE INSTRUCTIONS  °For the first 24 hours after general anesthesia:  °Have a responsible person with you.  °Do not drive a car. If you are alone, do not take public transportation.  °Do not drink alcohol.  °Do not take medicine that has not been prescribed by your health care provider.  °Do not sign important papers or make important decisions.  °You may resume a normal diet and activities as directed by your health care provider.  °Change bandages (dressings) as directed.  °If you have questions or problems that seem related to general anesthesia, call the hospital and ask for the anesthetist or anesthesiologist on call. °SEEK MEDICAL CARE IF:  °You have nausea and vomiting that continue the day after anesthesia.  °You develop a rash. °SEEK IMMEDIATE MEDICAL CARE IF:  °You have difficulty breathing.  °You have chest pain.  °You have any allergic problems. °Document Released: 06/28/2000 Document Revised: 11/22/2012 Document Reviewed: 10/05/2012  °[employer]® Patient Information ©2014 [employer].  ° °Sore Throat  ° ° °A sore throat is a painful, burning, sore, or scratchy feeling of the throat. There may be pain or tenderness when swallowing or talking. You may have  other symptoms with a sore throat. These include coughing, sneezing, fever, or a swollen neck. A sore throat is often the first sign of another sickness. These sicknesses may include a cold, flu, strep throat, or an infection called mono. Most sore throats go away without medical treatment.  °HOME CARE  °Only take medicine as told by your doctor.  °Drink enough fluids to keep your pee (urine) clear or pale yellow.  °Rest as needed.  °Try using throat sprays, lozenges, or suck on hard candy (if older than 4 years or as told).  °Sip warm liquids, such as broth, herbal tea, or warm water with honey. Try sucking on frozen ice pops or drinking cold liquids.  °Rinse the mouth (gargle) with salt water. Mix 1 teaspoon salt with 8 ounces of water.  °Do not smoke. Avoid being around others when they are smoking.  °Put a humidifier in your bedroom at night to moisten the air. You can also turn on a hot shower and sit in the bathroom for 5-10 minutes. Be sure the bathroom door is closed. °GET HELP RIGHT AWAY IF:  °You have trouble breathing.  °You cannot swallow fluids, soft foods, or your spit (saliva).  °You have more puffiness (swelling) in the throat.  °Your sore throat does not get better in 7 days.  °You feel sick to your stomach (nauseous) and throw up (vomit).  °You have a fever or lasting symptoms for more than 2-3 days.  °You have a fever and your symptoms suddenly get worse. °MAKE SURE YOU:  °Understand these   instructions.  Will watch your condition.  Will get help right away if you are not doing well or get worse. Document Released: 12/30/2007 Document Revised: 12/15/2011 Document Reviewed: 11/28/2011  [facility] Patient Information 2015 [place], [place]. This information is not intended to replace advice given to you by your health care provider. Make sure you discuss any questions you have with your health care provider.    Laceration Care, Adult    A laceration is a cut that goes through all layers of the  skin. The cut goes into the tissue beneath the skin. t. For wound glue:  You may shower or take baths. Do not soak or scrub the cut. Do not swim. Avoid heavy sweating until the glue falls off on its own. After a shower or bath, pat the cut dry with a clean towel.  Do not put medicine on your cut until the glue falls off.  If you have a bandage, do not put tape over the glue.  Avoid lots of sunlight or tanning lamps until the glue falls off. Put sunscreen on the cut for the first year to reduce your scar.  The glue will fall off on its own. Do not pick at the glue. You may need a tetanus shot if:  You cannot remember when you had your last tetanus shot.  You have never had a tetanus shot. If you need a tetanus shot and you choose not to have one, you may get tetanus. Sickness from tetanus can be serious.  GET HELP RIGHT AWAY IF:  Your pain does not get better with medicine.  Your arm, hand, leg, or foot loses feeling (numbness) or changes color.  Your cut is bleeding.  Your joint feels weak, or you cannot use your joint.  You have painful lumps on your body.  Your cut is red, puffy (swollen), or painful.  You have a red line on the skin near the cut.  You have yellowish-white fluid (pus) coming from the cut.  You have a fever.  You have a bad smell coming from the cut or bandage.  Your cut breaks open before or after stitches are removed.  You notice something coming out of the cut, such as wood or glass.  You cannot move a finger or toe. MAKE SURE YOU:  Understand these instructions.  Will watch your condition.  Will get help right away if you are not doing well or get worse. Document Released: 09/08/2007 Document Revised: 06/14/2011 Document Reviewed: 09/15/2010  [facility] Patient Information 2014 [place].

## 2014-05-31 NOTE — Anesthesia Preprocedure Evaluation (Addendum)
Anesthesia Evaluation  Patient identified by MRN, date of birth, ID band Patient awake    Reviewed: Allergy & Precautions, NPO status , Patient's Chart, lab work & pertinent test results  History of Anesthesia Complications (+) Emergence Delirium and history of anesthetic complications  Airway Mallampati: II  TM Distance: >3 FB     Dental  (+) Teeth Intact, Dental Advisory Given,    Pulmonary neg shortness of breath, neg COPDneg recent URI, former smoker,  breath sounds clear to auscultation        Cardiovascular hypertension, - angina- Past MI and - CHF Rhythm:Regular     Neuro/Psych Anxiety Depression negative neurological ROS     GI/Hepatic Neg liver ROS, GERD-  Controlled,  Endo/Other  diabetes  Renal/GU CRFRenal disease     Musculoskeletal   Abdominal   Peds  Hematology  (+) Blood dyscrasia, anemia ,   Anesthesia Other Findings   Reproductive/Obstetrics                            Anesthesia Physical Anesthesia Plan  ASA: III  Anesthesia Plan: MAC   Post-op Pain Management:    Induction: Intravenous  Airway Management Planned: Simple Face Mask and Natural Airway  Additional Equipment: None  Intra-op Plan:   Post-operative Plan: Extubation in OR  Informed Consent: I have reviewed the patients History and Physical, chart, labs and discussed the procedure including the risks, benefits and alternatives for the proposed anesthesia with the patient or authorized representative who has indicated his/her understanding and acceptance.   Dental advisory given  Plan Discussed with: CRNA, Surgeon and Anesthesiologist  Anesthesia Plan Comments:        Anesthesia Quick Evaluation

## 2014-05-31 NOTE — Progress Notes (Signed)
Dr.Early at bedside to assess pt. Bruit still present. Orders received okay to d/c pt home at this time

## 2014-05-31 NOTE — Transfer of Care (Signed)
Immediate Anesthesia Transfer of Care Note  Patient: [name] [name]  Procedure(s) Performed: Procedure(s): LEFT RADIOCEPHALIC ARTERIOVENOUS (AV) FISTULA CREATION  (Left)  Patient Location: PACU  Anesthesia Type:MAC  Level of Consciousness: awake, alert  and oriented  Airway & Oxygen Therapy: Patient Spontanous Breathing  Post-op Assessment: Report given to RN  Post vital signs: Reviewed  Last Vitals:  Filed Vitals:   05/31/14 0552  BP: 109/70  Pulse: 75  Temp: 36.7 C  Resp: 16    Complications: No apparent anesthesia complications

## 2014-05-31 NOTE — Progress Notes (Signed)
Unable to now hear bruit with stethoscope. Dr.Lawson aware and to bedside to assess. Bruit present with doppler. Instructed to hold pt in pacu and cont to monitor closely until Dr.Early comes by after his case to assess.

## 2014-05-31 NOTE — Anesthesia Postprocedure Evaluation (Signed)
  Anesthesia Post-op Note  Patient: [name] [name]  Procedure(s) Performed: Procedure(s): LEFT RADIOCEPHALIC ARTERIOVENOUS (AV) FISTULA CREATION  (Left)  Patient Location: PACU  Anesthesia Type:MAC  Level of Consciousness: awake  Airway and Oxygen Therapy: Patient Spontanous Breathing  Post-op Pain: none  Post-op Assessment: Post-op Vital signs reviewed, Patient's Cardiovascular Status Stable, Respiratory Function Stable, Patent Airway, No signs of Nausea or vomiting and Pain level controlled  Post-op Vital Signs: Reviewed and stable  Last Vitals:  Filed Vitals:   05/31/14 1015  BP:   Pulse: 62  Temp: 37 C  Resp: 13    Complications: No apparent anesthesia complications

## 2014-05-31 NOTE — Op Note (Signed)
OPERATIVE REPORT  Date of Surgery: 05/31/2014  Surgeon: Tinnie Gens, MD  Assistant:  Silva Bandy PA  Pre-op Diagnosis: End Stage Renal Disease  N18.6  Post-op Diagnosis: End Stage Renal Disease  N18.6  Procedure: Procedure(s): LEFT RADIOCEPHALIC ARTERIOVENOUS (AV) FISTULA CREATION   Anesthesia: Mac  EBL: Minimal  Complications: None  Procedure Details: The patient was taken to the operating roo  placed in supine position at which time left upper extremity was prepped Betadine scrub and solution draped in routine sterile manner. Using the SonoSite B mode ultrasound the veins were imaged. It did appear that the forearm cephalic vein was satisfactory for a forearm fistula being about 3 mm in size. After infiltration forms and Xylocaine with epinephrine short longitudinal incision was made just proximal the wrist cephalic vein dissected free transected after ligating it distally was gently dilated with heparinized saline was about 3 mm in size of good quality. Radial artery was exposed beneath the fashion circled with Vesseloops. It was a 3 mm artery with a good pulse. No heparin was given. Artery was occluded proximally and distally with Vesseloops open 15 blade extended with the Potts scissors. It would accept a 3 mm dilator that was good inflow. Vein was carefully measured spatulated and anastomosed inside with 6-0 Prolene. Vessel loops released there was a good pulse and thrill palpable in the fistula and good Doppler flow up to the antecubital area. Adequate hemostasis was achieved wound closed in layers with Vicryl subcuticular fashion with Dermabond patient taken to recovery room in stable condition  Tinnie Gens, MD 05/31/2014 8:56 AM

## 2014-05-31 NOTE — H&P (View-Only) (Signed)
Subjective:     Patient ID: [name] [name], male   DOB: 1961/03/23, 54 y.o.   MRN: [id number]  HPI this 54 year old male is evaluated for vascular access. He has had a previous cystectomy with an ilial conduit. Since his surgery he has had progressive renal dysfunction and will likely require hemodialysis later this year. He is left handed. He did have an injury to his right hand in the past which left him with some numbness in the third fourth and fifth fingers and hypersensitivity to cold. He has never been on hemodialysis in the past.  Past Medical History  Diagnosis Date  . Gout     recent flair -bilateral feet--  STABLE  . Frequency of urination   . Urgency incontinence   . Nocturia   . Diabetes mellitus type 2, diet-controlled PT STOPPED TAKING METFORMIN--  HAS BEEN WATCHING DIET, EXERCISING AND LOSING WT  . Bladder cancer 2013  . Complication of anesthesia     agitation w/awakening in 9/13,OK 01/24/12  . Hypertension   . Anxiety   . Depression   . GERD (gastroesophageal reflux disease)   . Acute renal injury 11/2013    History  Substance Use Topics  . Smoking status: Former Smoker -- 1.00 packs/day for 16 years    Types: Cigarettes    Quit date: 01/20/1992  . Smokeless tobacco: Former Systems developer    Types: [place] date: 01/20/1992  . Alcohol Use: No    Family History  Problem Relation Age of Onset  . Diabetes Mellitus II Other   . Hypertension Other   . Bladder Cancer Neg Hx     Allergies  Allergen Reactions  . Lisinopril Cough     Current outpatient prescriptions:  .  citalopram (CELEXA) 40 MG tablet, Take 40 mg by mouth daily., Disp: , Rfl:  .  Nutritional Supplements (FEEDING SUPPLEMENT, NEPRO CARB STEADY,) LIQD, Take 237 mLs by mouth 3 (three) times daily between meals., Disp: 90 Can, Rfl: 0 .  omeprazole (PRILOSEC) 40 MG capsule, Take 1 capsule (40 mg total) by mouth 2 (two) times daily before a meal., Disp: 60 capsule, Rfl: 1 .  SODIUM BICARBONATE PO,  Take 625 mg by mouth., Disp: , Rfl:  .  citric acid-sodium citrate (ORACIT) 334-500 MG/5ML solution, Take 15 mLs by mouth 4 (four) times daily - after meals and at bedtime. (Patient not taking: Reported on 05/28/2014), Disp: 473 mL, Rfl: 0 .  linezolid (ZYVOX) 600 MG tablet, Take 1 tablet (600 mg total) by mouth every 12 (twelve) hours. (Patient not taking: Reported on 05/28/2014), Disp: 22 tablet, Rfl: 0  BP 110/70 mmHg  Pulse 91  Resp 16  Ht 5\' 10"  (1.778 m)  Wt 179 lb (81.194 kg)  BMI 25.68 kg/m2  Body mass index is 25.68 kg/(m^2).         Review of Systems denies chest pain, dyspnea on exertion, PND, orthopnea, claudication. Does have history of hypertension and diabetes mellitus. Other systems negative and complete review of systems     Objective:   Physical Exam BP 110/70 mmHg  Pulse 91  Resp 16  Ht 5\' 10"  (1.778 m)  Wt 179 lb (81.194 kg)  BMI 25.68 kg/m2  Gen.-alert and oriented x3 in no apparent distress HEENT normal for age Lungs no rhonchi or wheezing Cardiovascular regular rhythm no murmurs carotid pulses 3+ palpable no bruits audible Abdomen soft nontender no palpable masses Musculoskeletal free of  major deformities Skin clear -no rashes  Neurologic normal Lower extremities 3+ femoral and dorsalis pedis pulses palpable bilaterally with no edema Left upper extremity with 3+ brachial and 2+ radial ulnar pulse palpable with well-perfused left hand Right upper extremity has 2+ radial pulse palpable but pale appearing hand with slight decreased sensation  Today I ordered vein mapping of the upper extremities which reveals a good caliber cephalic vein in the left arm and a good caliber basilic vein. Right upper extremity has a borderline basilic vein but inadequate cephalic vein. Arterial study was performed which revealed triphasic flow in the radial ulnar artery at the wrist and the left arm.      Assessment:     Chronic kidney disease-stage IV needs vascular  access    Plan:     Plan left radial cephalic or brachial cephalic AV fistula on Friday, February 26 Discussed potential for steal syndrome with patient Patient would like to proceed and questions were answered

## 2014-06-03 ENCOUNTER — Telehealth: Payer: Self-pay | Admitting: Vascular Surgery

## 2014-06-03 ENCOUNTER — Encounter (HOSPITAL_COMMUNITY): Payer: Self-pay | Admitting: Vascular Surgery

## 2014-06-03 NOTE — Telephone Encounter (Signed)
LM for [name] about patients follow up, dpm

## 2014-06-03 NOTE — Telephone Encounter (Signed)
-----   Message from Mena Goes, RN sent at 05/31/2014  9:56 AM EST ----- Regarding: Schedule   ----- Message -----    From: Alvia Grove, PA-C    Sent: 05/31/2014   8:47 AM      To: [facility] Charge Pool  S/p left radial-cephalic AVF [id number]  F/u with Dr. Kellie Simmering in 6 weeks with duplex.  Thanks Maudie Mercury

## 2014-06-04 ENCOUNTER — Encounter (HOSPITAL_COMMUNITY)
Admission: RE | Admit: 2014-06-04 | Discharge: 2014-06-04 | Disposition: A | Discharge status: Home / Self Care | Payer: BC Managed Care – PPO | Source: Ambulatory Visit | Attending: Nephrology | Admitting: Nephrology

## 2014-06-04 DIAGNOSIS — N189 Chronic kidney disease, unspecified: Secondary | ICD-10-CM | POA: Diagnosis not present

## 2014-06-04 DIAGNOSIS — D638 Anemia in other chronic diseases classified elsewhere: Secondary | ICD-10-CM | POA: Insufficient documentation

## 2014-06-04 LAB — RENAL FUNCTION PANEL
Albumin: 3.6 g/dL (ref 3.5–5.2)
Anion gap: 7 (ref 5–15)
BUN: 57 mg/dL — ABNORMAL HIGH (ref 6–23)
CALCIUM: 9.8 mg/dL (ref 8.4–10.5)
CHLORIDE: 116 mmol/L — AB (ref 96–112)
CO2: 17 mmol/L — AB (ref 19–32)
Creatinine, Ser: 2.5 mg/dL — ABNORMAL HIGH (ref 0.50–1.35)
GFR calc Af Amer: 32 mL/min — ABNORMAL LOW (ref >90)
GFR calc non Af Amer: 27 mL/min — ABNORMAL LOW (ref >90)
Glucose, Bld: 87 mg/dL (ref 70–99)
POTASSIUM: 3.9 mmol/L (ref 3.5–5.1)
Phosphorus: 3 mg/dL (ref 2.3–4.6)
SODIUM: 140 mmol/L (ref 135–145)

## 2014-06-04 LAB — POCT HEMOGLOBIN-HEMACUE: Hemoglobin: 10.3 g/dL — ABNORMAL LOW (ref 13.0–17.0)

## 2014-06-04 LAB — IRON AND TIBC
Iron: 174 ug/dL — ABNORMAL HIGH (ref 42–165)
Saturation Ratios: 87 % — ABNORMAL HIGH (ref 20–55)
TIBC: 199 ug/dL — AB (ref 215–435)
UIBC: 25 ug/dL — AB (ref 125–400)

## 2014-06-04 LAB — FERRITIN: FERRITIN: 862 ng/mL — AB (ref 22–322)

## 2014-06-04 MED ORDER — EPOETIN ALFA 20000 UNIT/ML IJ SOLN
20000.0000 [IU] | INTRAMUSCULAR | Status: DC
  Administered 2014-06-04: 20000 [IU] via SUBCUTANEOUS

## 2014-06-05 LAB — PTH, INTACT AND CALCIUM
CALCIUM TOTAL (PTH): 9.3 mg/dL (ref 8.7–10.2)
PTH: 17 pg/mL (ref 15–65)

## 2014-06-05 MED ORDER — EPOETIN ALFA 20000 UNIT/ML IJ SOLN
INTRAMUSCULAR | Status: AC
  Filled 2014-06-05: qty 1

## 2014-06-24 ENCOUNTER — Encounter: Payer: Self-pay | Admitting: *Deleted

## 2014-07-02 ENCOUNTER — Encounter (HOSPITAL_COMMUNITY)
Admission: RE | Admit: 2014-07-02 | Discharge: 2014-07-02 | Disposition: A | Discharge status: Home / Self Care | Payer: BC Managed Care – PPO | Source: Ambulatory Visit | Attending: Nephrology | Admitting: Nephrology

## 2014-07-02 DIAGNOSIS — D638 Anemia in other chronic diseases classified elsewhere: Secondary | ICD-10-CM | POA: Diagnosis not present

## 2014-07-02 LAB — RENAL FUNCTION PANEL
ANION GAP: 3 — AB (ref 5–15)
Albumin: 3.6 g/dL (ref 3.5–5.2)
BUN: 61 mg/dL — AB (ref 6–23)
CALCIUM: 9.3 mg/dL (ref 8.4–10.5)
CHLORIDE: 116 mmol/L — AB (ref 96–112)
CO2: 16 mmol/L — AB (ref 19–32)
Creatinine, Ser: 3.76 mg/dL — ABNORMAL HIGH (ref 0.50–1.35)
GFR calc Af Amer: 19 mL/min — ABNORMAL LOW (ref >90)
GFR calc non Af Amer: 17 mL/min — ABNORMAL LOW (ref >90)
Glucose, Bld: 155 mg/dL — ABNORMAL HIGH (ref 70–99)
PHOSPHORUS: 4 mg/dL (ref 2.3–4.6)
Potassium: 3.5 mmol/L (ref 3.5–5.1)
SODIUM: 135 mmol/L (ref 135–145)

## 2014-07-02 LAB — FERRITIN: Ferritin: 656 ng/mL — ABNORMAL HIGH (ref 22–322)

## 2014-07-02 LAB — IRON AND TIBC
Iron: 121 ug/dL (ref 42–165)
SATURATION RATIOS: 55 % (ref 20–55)
TIBC: 219 ug/dL (ref 215–435)
UIBC: 98 ug/dL — ABNORMAL LOW (ref 125–400)

## 2014-07-02 LAB — POCT HEMOGLOBIN-HEMACUE: Hemoglobin: 10.9 g/dL — ABNORMAL LOW (ref 13.0–17.0)

## 2014-07-02 MED ORDER — EPOETIN ALFA 20000 UNIT/ML IJ SOLN
INTRAMUSCULAR | Status: AC
  Administered 2014-07-02: 20000 [IU] via SUBCUTANEOUS
  Filled 2014-07-02: qty 1

## 2014-07-02 MED ORDER — EPOETIN ALFA 20000 UNIT/ML IJ SOLN
20000.0000 [IU] | INTRAMUSCULAR | Status: DC
  Administered 2014-07-02: 20000 [IU] via SUBCUTANEOUS

## 2014-07-03 LAB — PTH, INTACT AND CALCIUM
CALCIUM TOTAL (PTH): 9.2 mg/dL (ref 8.7–10.2)
PTH: 16 pg/mL (ref 15–65)

## 2014-07-05 ENCOUNTER — Observation Stay (HOSPITAL_COMMUNITY): Payer: BC Managed Care – PPO

## 2014-07-05 ENCOUNTER — Encounter (HOSPITAL_COMMUNITY): Payer: Self-pay | Admitting: General Practice

## 2014-07-05 ENCOUNTER — Inpatient Hospital Stay (HOSPITAL_COMMUNITY)
Admission: EM | Admit: 2014-07-05 | Discharge: 2014-07-07 | DRG: 312 | Disposition: A | Discharge status: Home / Self Care | Payer: BC Managed Care – PPO | Attending: Internal Medicine | Admitting: Internal Medicine

## 2014-07-05 ENCOUNTER — Emergency Department (HOSPITAL_COMMUNITY): Payer: BC Managed Care – PPO

## 2014-07-05 DIAGNOSIS — K219 Gastro-esophageal reflux disease without esophagitis: Secondary | ICD-10-CM | POA: Diagnosis present

## 2014-07-05 DIAGNOSIS — E119 Type 2 diabetes mellitus without complications: Secondary | ICD-10-CM

## 2014-07-05 DIAGNOSIS — N184 Chronic kidney disease, stage 4 (severe): Secondary | ICD-10-CM | POA: Diagnosis not present

## 2014-07-05 DIAGNOSIS — S0093XA Contusion of unspecified part of head, initial encounter: Secondary | ICD-10-CM

## 2014-07-05 DIAGNOSIS — N390 Urinary tract infection, site not specified: Secondary | ICD-10-CM | POA: Diagnosis present

## 2014-07-05 DIAGNOSIS — Z6828 Body mass index (BMI) 28.0-28.9, adult: Secondary | ICD-10-CM

## 2014-07-05 DIAGNOSIS — H539 Unspecified visual disturbance: Secondary | ICD-10-CM | POA: Diagnosis not present

## 2014-07-05 DIAGNOSIS — R55 Syncope and collapse: Principal | ICD-10-CM | POA: Diagnosis present

## 2014-07-05 DIAGNOSIS — W1839XA Other fall on same level, initial encounter: Secondary | ICD-10-CM | POA: Diagnosis present

## 2014-07-05 DIAGNOSIS — E46 Unspecified protein-calorie malnutrition: Secondary | ICD-10-CM | POA: Diagnosis present

## 2014-07-05 DIAGNOSIS — S0101XA Laceration without foreign body of scalp, initial encounter: Secondary | ICD-10-CM | POA: Diagnosis present

## 2014-07-05 DIAGNOSIS — C679 Malignant neoplasm of bladder, unspecified: Secondary | ICD-10-CM | POA: Diagnosis present

## 2014-07-05 DIAGNOSIS — Z79899 Other long term (current) drug therapy: Secondary | ICD-10-CM

## 2014-07-05 DIAGNOSIS — M109 Gout, unspecified: Secondary | ICD-10-CM | POA: Diagnosis present

## 2014-07-05 DIAGNOSIS — N189 Chronic kidney disease, unspecified: Secondary | ICD-10-CM

## 2014-07-05 DIAGNOSIS — Z87891 Personal history of nicotine dependence: Secondary | ICD-10-CM

## 2014-07-05 DIAGNOSIS — H5500 Unspecified nystagmus: Secondary | ICD-10-CM | POA: Diagnosis present

## 2014-07-05 DIAGNOSIS — M533 Sacrococcygeal disorders, not elsewhere classified: Secondary | ICD-10-CM | POA: Diagnosis present

## 2014-07-05 DIAGNOSIS — E872 Acidosis: Secondary | ICD-10-CM | POA: Diagnosis present

## 2014-07-05 DIAGNOSIS — I129 Hypertensive chronic kidney disease with stage 1 through stage 4 chronic kidney disease, or unspecified chronic kidney disease: Secondary | ICD-10-CM | POA: Diagnosis present

## 2014-07-05 DIAGNOSIS — F329 Major depressive disorder, single episode, unspecified: Secondary | ICD-10-CM | POA: Diagnosis present

## 2014-07-05 DIAGNOSIS — Z8551 Personal history of malignant neoplasm of bladder: Secondary | ICD-10-CM

## 2014-07-05 DIAGNOSIS — N179 Acute kidney failure, unspecified: Secondary | ICD-10-CM | POA: Diagnosis present

## 2014-07-05 DIAGNOSIS — F419 Anxiety disorder, unspecified: Secondary | ICD-10-CM | POA: Diagnosis present

## 2014-07-05 LAB — COMPREHENSIVE METABOLIC PANEL
ALBUMIN: 3.7 g/dL (ref 3.5–5.2)
ALT: 14 U/L (ref 0–53)
ANION GAP: 7 (ref 5–15)
AST: 23 U/L (ref 0–37)
Alkaline Phosphatase: 59 U/L (ref 39–117)
BUN: 54 mg/dL — ABNORMAL HIGH (ref 6–23)
CALCIUM: 9.5 mg/dL (ref 8.4–10.5)
CO2: 14 mmol/L — ABNORMAL LOW (ref 19–32)
CREATININE: 3.2 mg/dL — AB (ref 0.50–1.35)
Chloride: 118 mmol/L — ABNORMAL HIGH (ref 96–112)
GFR calc Af Amer: 24 mL/min — ABNORMAL LOW (ref >90)
GFR calc non Af Amer: 20 mL/min — ABNORMAL LOW (ref >90)
Glucose, Bld: 177 mg/dL — ABNORMAL HIGH (ref 70–99)
Potassium: 4 mmol/L (ref 3.5–5.1)
Sodium: 139 mmol/L (ref 135–145)
TOTAL PROTEIN: 7.3 g/dL (ref 6.0–8.3)
Total Bilirubin: 0.6 mg/dL (ref 0.3–1.2)

## 2014-07-05 LAB — URINALYSIS, ROUTINE W REFLEX MICROSCOPIC
Bilirubin Urine: NEGATIVE
Glucose, UA: NEGATIVE mg/dL
Ketones, ur: NEGATIVE mg/dL
NITRITE: NEGATIVE
Protein, ur: 30 mg/dL — AB
Specific Gravity, Urine: 1.01 (ref 1.005–1.030)
UROBILINOGEN UA: 0.2 mg/dL (ref 0.0–1.0)
pH: 7.5 (ref 5.0–8.0)

## 2014-07-05 LAB — PROTIME-INR
INR: 1.06 (ref 0.00–1.49)
Prothrombin Time: 14 seconds (ref 11.6–15.2)

## 2014-07-05 LAB — CBC
HCT: 33.5 % — ABNORMAL LOW (ref 39.0–52.0)
Hemoglobin: 11 g/dL — ABNORMAL LOW (ref 13.0–17.0)
MCH: 29.4 pg (ref 26.0–34.0)
MCHC: 32.8 g/dL (ref 30.0–36.0)
MCV: 89.6 fL (ref 78.0–100.0)
Platelets: 259 10*3/uL (ref 150–400)
RBC: 3.74 MIL/uL — ABNORMAL LOW (ref 4.22–5.81)
RDW: 15.5 % (ref 11.5–15.5)
WBC: 14.9 10*3/uL — ABNORMAL HIGH (ref 4.0–10.5)

## 2014-07-05 LAB — TROPONIN I: Troponin I: <0.03 ng/mL (ref <0.031)

## 2014-07-05 LAB — URINE MICROSCOPIC-ADD ON

## 2014-07-05 LAB — GLUCOSE, CAPILLARY: Glucose-Capillary: 151 mg/dL — ABNORMAL HIGH (ref 70–99)

## 2014-07-05 LAB — CBG MONITORING, ED: GLUCOSE-CAPILLARY: 91 mg/dL (ref 70–99)

## 2014-07-05 MED ORDER — ALUM & MAG HYDROXIDE-SIMETH 200-200-20 MG/5ML PO SUSP: 30.0000 mL | Freq: Four times a day (QID) | ORAL | Status: DC | PRN

## 2014-07-05 MED ORDER — SODIUM BICARBONATE 650 MG PO TABS
650.0000 mg | ORAL_TABLET | Freq: Two times a day (BID) | ORAL | Status: DC
  Administered 2014-07-05 – 2014-07-07 (×4): 650 mg via ORAL
  Filled 2014-07-05 (×6): qty 1

## 2014-07-05 MED ORDER — SODIUM CHLORIDE 0.9 % IV BOLUS (SEPSIS)
500.0000 mL | Freq: Once | INTRAVENOUS | Status: AC
  Administered 2014-07-05: 500 mL via INTRAVENOUS

## 2014-07-05 MED ORDER — CEFTRIAXONE SODIUM 1 G IJ SOLR
1.0000 g | INTRAMUSCULAR | Status: DC
  Filled 2014-07-05: qty 10

## 2014-07-05 MED ORDER — ONDANSETRON HCL 4 MG PO TABS: 4.0000 mg | ORAL_TABLET | Freq: Four times a day (QID) | ORAL | Status: DC | PRN

## 2014-07-05 MED ORDER — PANTOPRAZOLE SODIUM 40 MG PO TBEC
40.0000 mg | DELAYED_RELEASE_TABLET | Freq: Two times a day (BID) | ORAL | Status: DC
  Administered 2014-07-05 – 2014-07-07 (×4): 40 mg via ORAL
  Filled 2014-07-05 (×3): qty 1

## 2014-07-05 MED ORDER — MORPHINE SULFATE 2 MG/ML IJ SOLN: 1.0000 mg | INTRAMUSCULAR | Status: DC | PRN

## 2014-07-05 MED ORDER — SODIUM CHLORIDE 0.9 % IV SOLN
INTRAVENOUS | Status: DC
  Administered 2014-07-05 – 2014-07-07 (×3): via INTRAVENOUS

## 2014-07-05 MED ORDER — INSULIN ASPART 100 UNIT/ML ~~LOC~~ SOLN: 0.0000 [IU] | Freq: Every day | SUBCUTANEOUS | Status: DC

## 2014-07-05 MED ORDER — CITALOPRAM HYDROBROMIDE 40 MG PO TABS
40.0000 mg | ORAL_TABLET | Freq: Every day | ORAL | Status: DC
  Administered 2014-07-06 – 2014-07-07 (×2): 40 mg via ORAL
  Filled 2014-07-05 (×2): qty 1
  Filled 2014-07-05: qty 4

## 2014-07-05 MED ORDER — OXYCODONE HCL 5 MG PO TABS
5.0000 mg | ORAL_TABLET | ORAL | Status: DC | PRN
  Administered 2014-07-05 – 2014-07-06 (×2): 10 mg via ORAL
  Filled 2014-07-05 (×2): qty 2

## 2014-07-05 MED ORDER — MAGNESIUM HYDROXIDE 400 MG/5ML PO SUSP: 30.0000 mL | Freq: Every day | ORAL | Status: DC | PRN

## 2014-07-05 MED ORDER — NEPRO/CARBSTEADY PO LIQD
237.0000 mL | ORAL | Status: DC
  Administered 2014-07-06: 237 mL via ORAL
  Filled 2014-07-05: qty 237

## 2014-07-05 MED ORDER — ONDANSETRON HCL 4 MG/2ML IJ SOLN: 4.0000 mg | Freq: Four times a day (QID) | INTRAMUSCULAR | Status: DC | PRN

## 2014-07-05 MED ORDER — LIDOCAINE-EPINEPHRINE (PF) 2 %-1:200000 IJ SOLN
20.0000 mL | Freq: Once | INTRAMUSCULAR | Status: AC
  Administered 2014-07-05: 20 mL
  Filled 2014-07-05: qty 20

## 2014-07-05 MED ORDER — SODIUM CHLORIDE 0.9 % IV SOLN
1500.0000 mg | Freq: Once | INTRAVENOUS | Status: AC
  Administered 2014-07-05: 1500 mg via INTRAVENOUS
  Filled 2014-07-05: qty 1500

## 2014-07-05 MED ORDER — DOCUSATE SODIUM 100 MG PO CAPS
100.0000 mg | ORAL_CAPSULE | Freq: Two times a day (BID) | ORAL | Status: DC
  Administered 2014-07-05 – 2014-07-07 (×4): 100 mg via ORAL
  Filled 2014-07-05 (×4): qty 1

## 2014-07-05 MED ORDER — SODIUM CHLORIDE 0.9 % IV SOLN
500.0000 mg | INTRAVENOUS | Status: DC
  Filled 2014-07-05: qty 500

## 2014-07-05 MED ORDER — SODIUM CHLORIDE 0.9 % IJ SOLN: 3.0000 mL | Freq: Two times a day (BID) | INTRAMUSCULAR | Status: DC

## 2014-07-05 MED ORDER — SODIUM CHLORIDE 0.9 % IV BOLUS (SEPSIS): 500.0000 mL | Freq: Once | INTRAVENOUS | Status: DC

## 2014-07-05 MED ORDER — INSULIN ASPART 100 UNIT/ML ~~LOC~~ SOLN
0.0000 [IU] | Freq: Three times a day (TID) | SUBCUTANEOUS | Status: DC
  Administered 2014-07-06 – 2014-07-07 (×2): 1 [IU] via SUBCUTANEOUS

## 2014-07-05 MED ORDER — ENOXAPARIN SODIUM 30 MG/0.3ML ~~LOC~~ SOLN
30.0000 mg | SUBCUTANEOUS | Status: DC
  Administered 2014-07-05 – 2014-07-06 (×2): 30 mg via SUBCUTANEOUS
  Filled 2014-07-05 (×3): qty 0.3

## 2014-07-05 MED ORDER — ONDANSETRON HCL 4 MG/2ML IJ SOLN
4.0000 mg | Freq: Once | INTRAMUSCULAR | Status: AC
  Administered 2014-07-05: 4 mg via INTRAVENOUS
  Filled 2014-07-05: qty 2

## 2014-07-05 NOTE — ED Notes (Signed)
Pt brought in via [facility] after a brief loss of consciousness. [place] beach and all of the sudden "everything got real bright and I couldn't see". Pt got out of the car and fell backwards, hit his head, and had a brief loss of  Consciousness. Pt is A/O. Pt has a head laceration. Pt has a history of bladder cancer, diabetes, and has a left arm fistula. EMS V/S BP 120/70, HR 70, RR 16, SPO2 100%, and CBG 275.

## 2014-07-05 NOTE — ED Notes (Signed)
Pt states he is not diabetic although he previously took metformin to control his blood sugar some years ago. States he does not take any diabetic meds currently, including insulin.

## 2014-07-05 NOTE — H&P (Signed)
[facility] History and Physical                                                                                    [name] [name], is a 54 y.o. male  MRN: [id number]   DOB - [birth year]  Admit Date - 07/05/2014  Outpatient Primary MD for the patient is Florina Ou, MD  With History of -  Past Medical History  Diagnosis Date  . Gout     recent flair -bilateral feet--  STABLE  . Frequency of urination   . Urgency incontinence   . Nocturia   . Diabetes mellitus type 2, diet-controlled PT STOPPED TAKING METFORMIN--  HAS BEEN WATCHING DIET, EXERCISING AND LOSING WT  . Bladder cancer 2013  . Complication of anesthesia     agitation w/awakening in 9/13,OK 01/24/12  . Hypertension   . Anxiety   . Depression   . GERD (gastroesophageal reflux disease)   . Acute renal injury 11/2013  . Anemia   . Kidney failure       Past Surgical History  Procedure Laterality Date  . Ulner artery repair Right 2009    rt -trauma /thrombectomy,repair  . Transurethral resection of bladder tumor  12/15/2011    Procedure: TRANSURETHRAL RESECTION OF BLADDER TUMOR (TURBT);  Surgeon: Molli Hazard, MD;  Location: [facility];  Service: Urology;  Laterality: N/A;  2 HRS   . Lumbar laminectomy  06-02-2005    W/ RESECTION NERVE ROOT  L4  -  L5  . Transurethral resection of bladder tumor  01/24/2012    Procedure: TRANSURETHRAL RESECTION OF BLADDER TUMOR (TURBT);  Surgeon: Molli Hazard, MD;  Location: [facility];  Service: Urology;  Laterality: N/A;  90 MIN   . Cystoscopy with urethral dilatation  01/24/2012    Procedure: CYSTOSCOPY WITH URETHRAL DILATATION;  Surgeon: Molli Hazard, MD;  Location: [facility];  Service: Urology;  Laterality: N/A;  . Transurethral resection of bladder tumor N/A 07/10/2012    Procedure: TRANSURETHRAL RESECTION OF BLADDER TUMOR (TURBT);  Surgeon: Molli Hazard, MD;  Location: [facility];  Service: Urology;  Laterality: N/A;  . Cystoscopy w/ retrogrades Bilateral 07/10/2012    Procedure: CYSTOSCOPY WITH RETROGRADE PYELOGRAM;  Surgeon: Molli Hazard, MD;  Location[facility];  Service: Urology;  Laterality: Bilateral;  . Back surgery    . Bladder surgery  04/26/2013    at the [facility] [place] in  [place]    . Av fistula placement Left 05/31/2014    Procedure: LEFT RADIOCEPHALIC ARTERIOVENOUS (AV) FISTULA CREATION ;  Surgeon: Mal Misty, MD;  Location: [place];  Service: Vascular;  Laterality: Left;    in for   Chief Complaint  Patient presents with  . Loss of Consciousness     HPI 54 year old male patient with history of bladder cancer status post neobladder, history of chronic kidney disease stage IV with AV fistula in place for anticipated eventual hemodialysis, diet-controlled diabetes, hypertension, anxiety/depression and history of protein calorie malnutrition which actually is improving. Patient was brought to the ER after experiencing a syncopal event.  He was driving and noticed he was having blurred vision and then reported to his wife that everything seemed very white and bright around him. He pulled over because he felt faint. He proceeded to get out of the car before the wife couldn't get over to him he collapsed to the ground following hitting his head and sustaining an occipital laceration. His wife reports he was out for about 30-45 seconds but awakened without any focal neurological deficit.  Evaluation in the ER revealed an afebrile normotensive patient who is not hypoxic. CT the head and cervical spine without any acute changes. The laceration was stapled by the emergency room physician. Laboratory data was unremarkable except for mildly elevated chloride, renal function was stable and baseline, patient has chronic metabolic acidemia from chronic kidney disease, troponin was negative, patient did have white count  14,900, Hgb was stable at baseline. This patient has a history of recurrent UTIs so a urinalysis was obtained and was abnormal but difficult to determine if truly related to UTI since patient has neobladder.  Upon my evaluation I confirmed the above findings. Patient is now complaining of significant pain in the tailbone region. He has not had any recent issues regarding gastrointestinal or upper respiratory illness. He felt weaker several days ago but related this to "doing too much". He has chronic nocturia. He reports he is eating better and has decreased his Nepro to once daily. He's had no awareness of any palpitations.  Review of Systems   In addition to the HPI above,  No Fever-chills, myalgias or other constitutional symptoms No Headache, changes with Vision or hearing, new weakness, tingling, numbness in any extremity, no prior syncopal episodes No problems swallowing food or Liquids, indigestion/reflux No Chest pain, Cough or Shortness of Breath, palpitations, orthopnea or DOE No Abdominal pain, N/V; no melena or hematochezia, no dark tarry stools, Bowel movements are regular, No dysuria, hematuria or flank pain No new skin rashes, lesions, masses or bruises, No recent weight gain or loss No polyuria, polydypsia or polyphagia,  *A full 10 point Review of Systems was done, except as stated above, all other Review of Systems were negative.  Social History History  Substance Use Topics  . Smoking status: Former Smoker -- 1.00 packs/day for 16 years    Types: Cigarettes    Quit date: 01/20/1992  . Smokeless tobacco: Former Systems developer    Types: [place] date: 01/20/1992  . Alcohol Use: No    Family History Family History  Problem Relation Age of Onset  . Diabetes Mellitus II Other   . Hypertension Other   . Bladder Cancer Neg Hx     Prior to Admission medications   Medication Sig Start Date End Date Taking? Authorizing Provider  citalopram (CELEXA) 40 MG tablet Take 40 mg  by mouth daily.   Yes Historical Provider, MD  Nutritional Supplements (FEEDING SUPPLEMENT, NEPRO CARB STEADY,) LIQD Take 237 mLs by mouth 3 (three) times daily between meals. 12/01/13  Yes Shanker Kristeen Mans, MD  Nutritional Supplements (JUICE PLUS FIBRE PO) Take 1 tablet by mouth daily.   Yes Historical Provider, MD  pantoprazole (PROTONIX) 40 MG tablet Take 40 mg by mouth 2 (two) times daily. 05/11/14  Yes Historical Provider, MD  sodium bicarbonate 650 MG tablet Take 650 mg by mouth 2 (two) times daily. 05/11/14  Yes Historical Provider, MD  oxyCODONE-acetaminophen (ROXICET) 5-325 MG per tablet Take 1 tablet by mouth every 6 (six) hours as needed for severe  pain. Patient not taking: Reported on 07/05/2014 05/31/14   Alvia Grove, PA-C    Allergies  Allergen Reactions  . Lisinopril Cough    Physical Exam  Vitals  Blood pressure 120/67, pulse 79, temperature 97.8 F (36.6 C), temperature source Oral, resp. rate 15, height 5\' 10"  (1.778 m), weight 175 lb (79.379 kg), SpO2 100 %.   General:  In no acute distress except for complaints of significant coccygeal pain, appears healthy and well nourished  Psych:  Normal affect, Denies Suicidal or Homicidal ideations, Awake Alert, Oriented X 3. Speech and thought patterns are clear and appropriate, no apparent short term memory deficits  Neuro:   No focal neurological deficits, CN II through XII intact, Strength 5/5 all 4 extremities, Sensation intact all 4 extremities.  ENT:  Ears and Eyes appear Normal, Conjunctivae clear, PER. Moist oral mucosa without erythema or exudates.  Neck:  Supple, No lymphadenopathy appreciated  Respiratory:  Symmetrical chest wall movement, Good air movement bilaterally, CTAB. Room Air  Cardiac:  RRR, No Murmurs, no LE edema noted, no JVD, No carotid bruits, peripheral pulses palpable at 2+  Abdomen:  Positive bowel sounds, Soft, Non tender, Non distended,  No masses appreciated, no obvious  hepatosplenomegaly  Skin:  No Cyanosis, Normal Skin Turgor, No Skin Rash or Bruise.  Extremities: Symmetrical without obvious trauma or injury,  no effusions.  Other: Exquisite tenderness with direct palpation over coccygeal area-superficial abrasions noted  Data Review  CBC  Recent Labs Lab 07/02/14 1008 07/05/14 1318  WBC  --  14.9*  HGB 10.9* 11.0*  HCT  --  33.5*  PLT  --  259  MCV  --  89.6  MCH  --  29.4  MCHC  --  32.8  RDW  --  15.5    Chemistries   Recent Labs Lab 07/02/14 1031 07/05/14 1318  NA 135 139  K 3.5 4.0  CL 116* 118*  CO2 16* 14*  GLUCOSE 155* 177*  BUN 61* 54*  CREATININE 3.76* 3.20*  CALCIUM 9.3  9.2 9.5  AST  --  23  ALT  --  14  ALKPHOS  --  59  BILITOT  --  0.6    estimated creatinine clearance is 26.9 mL/min (by C-G formula based on Cr of 3.2).  No results for input(s): TSH, T4TOTAL, T3FREE, THYROIDAB in the last 72 hours.  Invalid input(s): FREET3  Coagulation profile  Recent Labs Lab 07/05/14 1318  INR 1.06    No results for input(s): DDIMER in the last 72 hours.  Cardiac Enzymes  Recent Labs Lab 07/05/14 1318  TROPONINI <0.03    Invalid input(s): POCBNP  Urinalysis    Component Value Date/Time   COLORURINE YELLOW 07/05/2014 1438   APPEARANCEUR TURBID* 07/05/2014 1438   LABSPEC 1.010 07/05/2014 1438   PHURINE 7.5 07/05/2014 1438   GLUCOSEU NEGATIVE 07/05/2014 1438   HGBUR SMALL* 07/05/2014 1438   BILIRUBINUR NEGATIVE 07/05/2014 1438   BILIRUBINUR small 08/29/2013 1836   KETONESUR NEGATIVE 07/05/2014 1438   PROTEINUR 30* 07/05/2014 1438   PROTEINUR >=300 08/29/2013 1836   UROBILINOGEN 0.2 07/05/2014 1438   UROBILINOGEN 0.2 08/29/2013 1836   NITRITE NEGATIVE 07/05/2014 1438   NITRITE neg 08/29/2013 1836   LEUKOCYTESUR LARGE* 07/05/2014 1438    Imaging results:   Ct Head Wo Contrast  07/05/2014   CLINICAL DATA:  Pain following fall  EXAM: CT HEAD WITHOUT CONTRAST  CT CERVICAL SPINE WITHOUT  CONTRAST  TECHNIQUE: Multidetector CT imaging of  the head and cervical spine was performed following the standard protocol without intravenous contrast. Multiplanar CT image reconstructions of the cervical spine were also generated.  COMPARISON:  Head CT May 02, 2013; cervical spine series October 21, 2012  FINDINGS: CT HEAD FINDINGS  The ventricles are upper normal in size for age with sulci normal in size and configuration. There is no intracranial mass, hemorrhage, extra-axial fluid collection, or midline shift. Gray-white compartments are normal. No acute infarct apparent.  There are skin staples over the right parietal bone. The bony calvarium appears intact. The mastoid air cells are clear.  CT CERVICAL SPINE FINDINGS  There is no fracture or spondylolisthesis. Prevertebral soft tissues and predental space regions are normal. There is moderately severe disc space narrowing at C5-6 and C6-7. There is slight disc space narrowing at C4-5 and C7-T1. There is facet hypertrophy at several levels bilaterally. No disc extrusion or stenosis. There is exit foraminal narrowing on the right at C5-6 and at C6-7 bilaterally, more severe on the left than on the right, due to bony hypertrophy. There is impression on exiting nerve roots at these levels.  IMPRESSION: CT head: Skin staples over the right parietal region with underlying bone appearing intact. No intracranial mass, hemorrhage, or extra-axial fluid. No evidence of acute infarct.  CT cervical spine: Multilevel osteoarthritic change as outlined above. No fracture or spondylolisthesis.   Electronically Signed   By: Lowella Grip III M.D.   On: 07/05/2014 15:24   Ct Cervical Spine Wo Contrast  07/05/2014   CLINICAL DATA:  Pain following fall  EXAM: CT HEAD WITHOUT CONTRAST  CT CERVICAL SPINE WITHOUT CONTRAST  TECHNIQUE: Multidetector CT imaging of the head and cervical spine was performed following the standard protocol without intravenous contrast. Multiplanar  CT image reconstructions of the cervical spine were also generated.  COMPARISON:  Head CT May 02, 2013; cervical spine series October 21, 2012  FINDINGS: CT HEAD FINDINGS  The ventricles are upper normal in size for age with sulci normal in size and configuration. There is no intracranial mass, hemorrhage, extra-axial fluid collection, or midline shift. Gray-white compartments are normal. No acute infarct apparent.  There are skin staples over the right parietal bone. The bony calvarium appears intact. The mastoid air cells are clear.  CT CERVICAL SPINE FINDINGS  There is no fracture or spondylolisthesis. Prevertebral soft tissues and predental space regions are normal. There is moderately severe disc space narrowing at C5-6 and C6-7. There is slight disc space narrowing at C4-5 and C7-T1. There is facet hypertrophy at several levels bilaterally. No disc extrusion or stenosis. There is exit foraminal narrowing on the right at C5-6 and at C6-7 bilaterally, more severe on the left than on the right, due to bony hypertrophy. There is impression on exiting nerve roots at these levels.  IMPRESSION: CT head: Skin staples over the right parietal region with underlying bone appearing intact. No intracranial mass, hemorrhage, or extra-axial fluid. No evidence of acute infarct.  CT cervical spine: Multilevel osteoarthritic change as outlined above. No fracture or spondylolisthesis.   Electronically Signed   By: Lowella Grip III M.D.   On: 07/05/2014 15:24     EKG: Sinus rhythm   Assessment & Plan  Principal Problem:   Syncope and collapse -Admit to telemetry/observational status -CT head and C-spine unremarkable -EKG normal and initial troponin normal so low index of suspicion this is cardiogenic syncope; no reports of CP -No awareness of palpitations but will continue telemetry monitoring as  above -Suspect vasovagal versus orthostasis etiology; check orthostatic vital signs and begin IV fluids -Could be  precipitated by possible urinary tract infection -MD interviewed the patient and during interview pt reported vertigo type sx's and on exam was then noted to have nystagmus  Active Problems:   Acute coccygeal pain -Likely sustained when patient fell backwards during syncopal event -Area quite tender to palpation on exam -Check plain x-ray coccyx to determine if fracture present -Provide adequate pain management with both oral and IV narcotics -Physical therapy evaluation; suspect pain may affect patient's mobility    Occipital scalp laceration -Wound approximated in the ER with staples -Routine wound management    Suspected UTI (lower urinary tract infection) -Urinalysis highly abnormal and patient does have leukocytosis -Review of previous urine cultures demonstrate recurrent enterococcus( multi drug resistant) and sensitive only to vancomycin -Obtain urine culture and follow up on results -Ask pharmacy to dose empiric vancomycin    CKD (chronic kidney disease), stage IV -Renal function stable and at baseline    Diabetes mellitus type 2, diet-controlled -Continue carb modified diet -Check CBGs and provide sliding scale insulin if indicated    Bladder cancer -Stable    Protein calorie malnutrition -Continue daily Nepro as per routine at home    DVT Prophylaxis: Lovenox, dose adjusted based on creatinine clearance  Family Communication: Wife at bedside    Code Status:  Full code  Condition: Stable   Time spent in minutes : 60   Sitlaly Gudiel L. ANP on 07/05/2014 at 4:43 PM  Between 7am to 7pm - Pager - [phone]  After 7pm go to [email or web address] - password [email or web address]  And look for the night coverage person covering me after hours  [facility]

## 2014-07-05 NOTE — ED Notes (Signed)
Pt is complaining lower back/lumbar paid, pt is reddened above rectum

## 2014-07-05 NOTE — Progress Notes (Signed)
ANTIBIOTIC CONSULT NOTE - INITIAL  Pharmacy Consult for Vancomycin Indication: H/o enterococcal UTI   Allergies  Allergen Reactions  . Lisinopril Cough    Patient Measurements: Height: 5\' 10"  (177.8 cm) Weight: 175 lb (79.379 kg) IBW/kg (Calculated) : 73  Vital Signs: Temp: 97.8 F (36.6 C) (04/01 1314) Temp Source: Oral (04/01 1314) BP: 120/67 mmHg (04/01 1630) Pulse Rate: 79 (04/01 1630) Intake/Output from previous day:   Intake/Output from this shift:    Labs:  Recent Labs  07/05/14 1318  WBC 14.9*  HGB 11.0*  PLT 259  CREATININE 3.20*   Estimated Creatinine Clearance: 26.9 mL/min (by C-G formula based on Cr of 3.2). No results for input(s): VANCOTROUGH, VANCOPEAK, VANCORANDOM, GENTTROUGH, GENTPEAK, GENTRANDOM, TOBRATROUGH, TOBRAPEAK, TOBRARND, AMIKACINPEAK, AMIKACINTROU, AMIKACIN in the last 72 hours.   Microbiology: No results found for this or any previous visit (from the past 720 hour(s)).  Medical History: Past Medical History  Diagnosis Date  . Gout     recent flair -bilateral feet--  STABLE  . Frequency of urination   . Urgency incontinence   . Nocturia   . Diabetes mellitus type 2, diet-controlled PT STOPPED TAKING METFORMIN--  HAS BEEN WATCHING DIET, EXERCISING AND LOSING WT  . Bladder cancer 2013  . Complication of anesthesia     agitation w/awakening in 9/13,OK 01/24/12  . Hypertension   . Anxiety   . Depression   . GERD (gastroesophageal reflux disease)   . Acute renal injury 11/2013  . Anemia   . Kidney failure     Medications:   (Not in a hospital admission) Assessment: 28 YOM with a history of bladder cancer post neobladder and history of CKD stage IV with AV fistula in place for anticipated eventual HD. He was brought to the ED after collapsing to the ground and hitting his head. CT of head and spine showed no acute changes. Pharmacy consulted to start Vancomycin for a history of UTI's caused by enterococcus sensitive to Vancomycin.    SCr has trended up to 3.2 this admission; WBC elevated at 14.9. Pt is afebrile. CrCl ~ 37 mL/min   Cultures: 4/1 Urine Cx >>   Goal of Therapy:  Vancomycin trough level 10-15 mcg/ml  Plan:  -Give Vancomycin 1500 mg IV load followed by Vanc 500 mg IV Q 24 hours -Monitor CBC, renal fx and cultures -VT at [place], PharmD., BCPS Clinical Pharmacist Pager [phone]

## 2014-07-05 NOTE — ED Provider Notes (Signed)
CSN: [id number]     Arrival date & time 07/05/14  1304 History   First MD Initiated Contact with Patient 07/05/14 1308     Chief Complaint  Patient presents with  . Loss of Consciousness     (Consider location/radiation/quality/duration/timing/severity/associated sxs/prior Treatment) Patient is a 54 y.o. male presenting with syncope. The history is provided by the patient.  Loss of Consciousness Associated symptoms: nausea and vomiting   Associated symptoms: no chest pain, no confusion, no fever, no headaches, no palpitations, no shortness of breath and no weakness   pt presents via ems post syncopal episode.  Pt was driving w family to beach when stated everything got bright.  States entire visual field, both eyes, seemed very bright. States then he got anxious feeling, then hot, and became scared he wouldn't be able to drive/see. He was able to pull over to side of road.  Pt got out of car, and then fainting, falling backward hitting head. Brief loc. On arrival to ED, states vision seems normal. No excessive brightness. No photophobia. Pt denies headache. No neck pain/stiffness. No change in speech. No facial or extremity numbness or weakness. +nausea. No fevers. Pt denies current or recent cp or discomfort. No cough or uri c/o. No abd pain. No diarrhea.  No rectal bleeding or recent blood loss (x minor bleeding from new scalp wound). Has hx bladder ca, neo bladder, denies recent urinary symptoms or fevers.      Past Medical History  Diagnosis Date  . Gout     recent flair -bilateral feet--  STABLE  . Frequency of urination   . Urgency incontinence   . Nocturia   . Diabetes mellitus type 2, diet-controlled PT STOPPED TAKING METFORMIN--  HAS BEEN WATCHING DIET, EXERCISING AND LOSING WT  . Bladder cancer 2013  . Complication of anesthesia     agitation w/awakening in 9/13,OK 01/24/12  . Hypertension   . Anxiety   . Depression   . GERD (gastroesophageal reflux disease)   . Acute renal  injury 11/2013  . Anemia   . Kidney failure    Past Surgical History  Procedure Laterality Date  . [place] artery repair Right 2009    rt -trauma /thrombectomy,repair  . Transurethral resection of bladder tumor  12/15/2011    Procedure: TRANSURETHRAL RESECTION OF BLADDER TUMOR (TURBT);  Surgeon: Molli Hazard, MD;  Location[facility]  Service: Urology;  Laterality: N/A;  2 HRS   . Lumbar laminectomy  06-02-2005    W/ RESECTION NERVE ROOT  L4  -  L5  . Transurethral resection of bladder tumor  01/24/2012    Procedure: TRANSURETHRAL RESECTION OF BLADDER TUMOR (TURBT);  Surgeon: Molli Hazard, MD;  Location: [facility];  Service: Urology;  Laterality: N/A;  90 MIN   . Cystoscopy with urethral dilatation  01/24/2012    Procedure: CYSTOSCOPY WITH URETHRAL DILATATION;  Surgeon: Molli Hazard, MD;  Location: [facility];  Service: Urology;  Laterality: N/A;  . Transurethral resection of bladder tumor N/A 07/10/2012    Procedure: TRANSURETHRAL RESECTION OF BLADDER TUMOR (TURBT);  Surgeon: Molli Hazard, MD;  Location: [facility];  Service: Urology;  Laterality: N/A;  . Cystoscopy w/ retrogrades Bilateral 07/10/2012    Procedure: CYSTOSCOPY WITH RETROGRADE PYELOGRAM;  Surgeon: Molli Hazard, MD;  Location: [facility];  Service: Urology;  Laterality: Bilateral;  . Back surgery    . Bladder surgery  04/26/2013  at the [facility] [place] in  [place]    . Av fistula placement Left 05/31/2014    Procedure: LEFT RADIOCEPHALIC ARTERIOVENOUS (AV) FISTULA CREATION ;  Surgeon: Mal Misty, MD;  Location: [facility];  Service: Vascular;  Laterality: Left;   Family History  Problem Relation Age of Onset  . Diabetes Mellitus II Other   . Hypertension Other   . Bladder Cancer Neg Hx    History  Substance Use Topics  . Smoking status: Former Smoker -- 1.00 packs/day for 16 years     Types: Cigarettes    Quit date: 01/20/1992  . Smokeless tobacco: Former Systems developer    Types: [place] date: 01/20/1992  . Alcohol Use: No    Review of Systems  Constitutional: Negative for fever.  HENT: Negative for sore throat.   Eyes: Negative for pain.  Respiratory: Negative for cough and shortness of breath.   Cardiovascular: Positive for syncope. Negative for chest pain, palpitations and leg swelling.  Gastrointestinal: Positive for nausea and vomiting. Negative for abdominal pain, diarrhea and abdominal distention.  Endocrine: Negative for polyuria.  Genitourinary: Negative for dysuria and flank pain.  Musculoskeletal: Negative for back pain and neck pain.  Skin: Negative for rash.  Neurological: Negative for weakness, numbness and headaches.  Hematological: Does not bruise/bleed easily.  Psychiatric/Behavioral: Negative for confusion.      Allergies  Lisinopril  Home Medications   Prior to Admission medications   Medication Sig Start Date End Date Taking? Authorizing Provider  citalopram (CELEXA) 40 MG tablet Take 40 mg by mouth daily.    Historical Provider, MD  Nutritional Supplements (FEEDING SUPPLEMENT, NEPRO CARB STEADY,) LIQD Take 237 mLs by mouth 3 (three) times daily between meals. 12/01/13   Shanker Kristeen Mans, MD  oxyCODONE-acetaminophen (ROXICET) 5-325 MG per tablet Take 1 tablet by mouth every 6 (six) hours as needed for severe pain. 05/31/14   Alvia Grove, PA-C  pantoprazole (PROTONIX) 40 MG tablet Take 40 mg by mouth 2 (two) times daily. 05/11/14   Historical Provider, MD  sodium bicarbonate 650 MG tablet Take 650 mg by mouth 2 (two) times daily. 05/11/14   Historical Provider, MD   BP 137/85 mmHg  Pulse 66  Temp(Src) 97.8 F (36.6 C) (Oral)  Resp 17  Ht 5\' 10"  (1.778 m)  Wt 175 lb (79.379 kg)  BMI 25.11 kg/m2  SpO2 100% Physical Exam  Constitutional: He is oriented to person, place, and time. He appears well-developed and well-nourished. No  distress.  HENT:  Mouth/Throat: Oropharynx is clear and moist.  Stellate 5 cm occipital scalp wound, slow ooze blood.   Eyes: Conjunctivae and EOM are normal. Pupils are equal, round, and reactive to light. No scleral icterus.  Neck: Normal range of motion. Neck supple. No tracheal deviation present. No thyromegaly present.  No bruits  Cardiovascular: Normal rate, regular rhythm, normal heart sounds and intact distal pulses.  Exam reveals no gallop and no friction rub.   No murmur heard. Pulmonary/Chest: Effort normal and breath sounds normal. No accessory muscle usage. No respiratory distress.  Abdominal: Soft. Bowel sounds are normal. He exhibits no distension and no mass. There is no tenderness. There is no rebound and no guarding.  Genitourinary:  No cva tenderness  Musculoskeletal: Normal range of motion. He exhibits no edema or tenderness.  Good rom bil ext without pain or focal bony tenderness.  Mild mid cervical tenderness, otherwise CTLS spine, non tender, aligned, no step off.  Neurological: He is alert and oriented to person, place, and time. No cranial nerve deficit.  Motor intact bil. stre 5/5. sens grossly intact.   Skin: Skin is warm and dry. He is not diaphoretic.  Psychiatric: He has a normal mood and affect.  Nursing note and vitals reviewed.   ED Course  Procedures (including critical care time) Labs Review  Results for orders placed or performed during the hospital encounter of 07/05/14  CBC  Result Value Ref Range   WBC 14.9 (H) 4.0 - 10.5 K/uL   RBC 3.74 (L) 4.22 - 5.81 MIL/uL   Hemoglobin 11.0 (L) 13.0 - 17.0 g/dL   HCT 33.5 (L) 39.0 - 52.0 %   MCV 89.6 78.0 - 100.0 fL   MCH 29.4 26.0 - 34.0 pg   MCHC 32.8 30.0 - 36.0 g/dL   RDW 15.5 11.5 - 15.5 %   Platelets 259 150 - 400 K/uL  Comprehensive metabolic panel  Result Value Ref Range   Sodium 139 135 - 145 mmol/L   Potassium 4.0 3.5 - 5.1 mmol/L   Chloride 118 (H) 96 - 112 mmol/L   CO2 14 (L) 19 -  32 mmol/L   Glucose, Bld 177 (H) 70 - 99 mg/dL   BUN 54 (H) 6 - 23 mg/dL   Creatinine, Ser 3.20 (H) 0.50 - 1.35 mg/dL   Calcium 9.5 8.4 - 10.5 mg/dL   Total Protein 7.3 6.0 - 8.3 g/dL   Albumin 3.7 3.5 - 5.2 g/dL   AST 23 0 - 37 U/L   ALT 14 0 - 53 U/L   Alkaline Phosphatase 59 39 - 117 U/L   Total Bilirubin 0.6 0.3 - 1.2 mg/dL   GFR calc non Af Amer 20 (L) >90 mL/min   GFR calc Af Amer 24 (L) >90 mL/min   Anion gap 7 5 - 15  Protime-INR  Result Value Ref Range   Prothrombin Time 14.0 11.6 - 15.2 seconds   INR 1.06 0.00 - 1.49  Urinalysis, Routine w reflex microscopic  Result Value Ref Range   Color, Urine YELLOW YELLOW   APPearance TURBID (A) CLEAR   Specific Gravity, Urine 1.010 1.005 - 1.030   pH 7.5 5.0 - 8.0   Glucose, UA NEGATIVE NEGATIVE mg/dL   Hgb urine dipstick SMALL (A) NEGATIVE   Bilirubin Urine NEGATIVE NEGATIVE   Ketones, ur NEGATIVE NEGATIVE mg/dL   Protein, ur 30 (A) NEGATIVE mg/dL   Urobilinogen, UA 0.2 0.0 - 1.0 mg/dL   Nitrite NEGATIVE NEGATIVE   Leukocytes, UA LARGE (A) NEGATIVE  Troponin I  Result Value Ref Range   Troponin I <0.03 <0.031 ng/mL  Urine microscopic-add on  Result Value Ref Range   Squamous Epithelial / LPF RARE RARE   WBC, UA 21-50 <3 WBC/hpf   RBC / HPF 0-2 <3 RBC/hpf   Bacteria, UA MANY (A) RARE   Casts GRANULAR CAST (A) NEGATIVE   Ct Head Wo Contrast  07/05/2014   CLINICAL DATA:  Pain following fall  EXAM: CT HEAD WITHOUT CONTRAST  CT CERVICAL SPINE WITHOUT CONTRAST  TECHNIQUE: Multidetector CT imaging of the head and cervical spine was performed following the standard protocol without intravenous contrast. Multiplanar CT image reconstructions of the cervical spine were also generated.  COMPARISON:  Head CT May 02, 2013; cervical spine series October 21, 2012  FINDINGS: CT HEAD FINDINGS  The ventricles are upper normal in size for age with sulci normal in size and configuration. There is no intracranial mass,  hemorrhage, extra-axial  fluid collection, or midline shift. Gray-white compartments are normal. No acute infarct apparent.  There are skin staples over the right parietal bone. The bony calvarium appears intact. The mastoid air cells are clear.  CT CERVICAL SPINE FINDINGS  There is no fracture or spondylolisthesis. Prevertebral soft tissues and predental space regions are normal. There is moderately severe disc space narrowing at C5-6 and C6-7. There is slight disc space narrowing at C4-5 and C7-T1. There is facet hypertrophy at several levels bilaterally. No disc extrusion or stenosis. There is exit foraminal narrowing on the right at C5-6 and at C6-7 bilaterally, more severe on the left than on the right, due to bony hypertrophy. There is impression on exiting nerve roots at these levels.  IMPRESSION: CT head: Skin staples over the right parietal region with underlying bone appearing intact. No intracranial mass, hemorrhage, or extra-axial fluid. No evidence of acute infarct.  CT cervical spine: Multilevel osteoarthritic change as outlined above. No fracture or spondylolisthesis.   Electronically Signed   By: Lowella Grip III M.D.   On: 07/05/2014 15:24   Ct Cervical Spine Wo Contrast  07/05/2014   CLINICAL DATA:  Pain following fall  EXAM: CT HEAD WITHOUT CONTRAST  CT CERVICAL SPINE WITHOUT CONTRAST  TECHNIQUE: Multidetector CT imaging of the head and cervical spine was performed following the standard protocol without intravenous contrast. Multiplanar CT image reconstructions of the cervical spine were also generated.  COMPARISON:  Head CT May 02, 2013; cervical spine series October 21, 2012  FINDINGS: CT HEAD FINDINGS  The ventricles are upper normal in size for age with sulci normal in size and configuration. There is no intracranial mass, hemorrhage, extra-axial fluid collection, or midline shift. Gray-white compartments are normal. No acute infarct apparent.  There are skin staples over the right parietal bone. The bony  calvarium appears intact. The mastoid air cells are clear.  CT CERVICAL SPINE FINDINGS  There is no fracture or spondylolisthesis. Prevertebral soft tissues and predental space regions are normal. There is moderately severe disc space narrowing at C5-6 and C6-7. There is slight disc space narrowing at C4-5 and C7-T1. There is facet hypertrophy at several levels bilaterally. No disc extrusion or stenosis. There is exit foraminal narrowing on the right at C5-6 and at C6-7 bilaterally, more severe on the left than on the right, due to bony hypertrophy. There is impression on exiting nerve roots at these levels.  IMPRESSION: CT head: Skin staples over the right parietal region with underlying bone appearing intact. No intracranial mass, hemorrhage, or extra-axial fluid. No evidence of acute infarct.  CT cervical spine: Multilevel osteoarthritic change as outlined above. No fracture or spondylolisthesis.   Electronically Signed   By: Lowella Grip III M.D.   On: 07/05/2014 15:24       EKG Interpretation   Date/Time:  Friday July 05 2014 13:10:41 EDT Ventricular Rate:  67 PR Interval:  162 QRS Duration: 112 QT Interval:  412 QTC Calculation: 435 R Axis:   75 Text Interpretation:  Sinus rhythm Baseline wander in lead(s) II III aVF  Confirmed by Ashok Cordia  MD, Lennette Bihari ([id number]) on 07/05/2014 1:34:42 PM      MDM   Iv ns. Continuous pulse ox and monitor. Labs.  Ct.  Reviewed nursing notes and prior charts for additional history.   Iv ns 500 cc bolus.  Pt continued to bleed/slow ooze blood from scalp wound, therefore closed wound.  LACERATION REPAIR Performed by: Mirna Mires Consent: Verbal consent  obtained. Risks and benefits: risks, benefits and alternatives were discussed Patient identity confirmed: provided demographic data Time out performed prior to procedure Prepped and Draped in normal sterile fashion Wound explored, no fb seen or felt. No step off or apparent fracture.  Laceration  Location: posterior scalp Laceration Length: 5 cm, stellate No Foreign Bodies seen or palpated Anesthesia: local infiltration Local anesthetic: lidocaine 2% w epinephrine Anesthetic total: 8 ml Irrigation method: syringe Amount of cleaning: irrigated very well w sterile saline Skin closure: surgical staples 4, sutures 4 Number of sutures or staples: 4/4 Technique: interrrupted Patient tolerance: Patient tolerated the procedure well with no immediate complications. Initially closed wound/periphery of wound using staples, as reached central aspect of stellate wound, due to contused/avulsed tissue, felt remainder of wound would approximate better w sutures.   Recheck pt, no change in monitor. Sinus rhythm.  Recheck, no visual changes. Spine nt.  abd soft nt.  Urine dirty, however will never be sterile given bladder surgery/neobladder - will cx, will hold rx unless symptomatic/febrile.  Given earlier visual symptoms, syncope, will contact medical service for admission.     Lajean Saver, MD 07/05/14 1539

## 2014-07-05 NOTE — ED Notes (Signed)
MD Steinl at bedside. 

## 2014-07-05 NOTE — ED Notes (Signed)
CBG 91 

## 2014-07-06 ENCOUNTER — Encounter (HOSPITAL_COMMUNITY): Payer: Self-pay | Admitting: General Practice

## 2014-07-06 DIAGNOSIS — H539 Unspecified visual disturbance: Secondary | ICD-10-CM | POA: Diagnosis present

## 2014-07-06 DIAGNOSIS — R55 Syncope and collapse: Secondary | ICD-10-CM | POA: Diagnosis present

## 2014-07-06 DIAGNOSIS — W1839XA Other fall on same level, initial encounter: Secondary | ICD-10-CM | POA: Diagnosis present

## 2014-07-06 DIAGNOSIS — S0101XA Laceration without foreign body of scalp, initial encounter: Secondary | ICD-10-CM | POA: Diagnosis present

## 2014-07-06 DIAGNOSIS — E46 Unspecified protein-calorie malnutrition: Secondary | ICD-10-CM | POA: Diagnosis present

## 2014-07-06 DIAGNOSIS — I129 Hypertensive chronic kidney disease with stage 1 through stage 4 chronic kidney disease, or unspecified chronic kidney disease: Secondary | ICD-10-CM | POA: Diagnosis present

## 2014-07-06 DIAGNOSIS — K219 Gastro-esophageal reflux disease without esophagitis: Secondary | ICD-10-CM | POA: Diagnosis present

## 2014-07-06 DIAGNOSIS — E119 Type 2 diabetes mellitus without complications: Secondary | ICD-10-CM | POA: Diagnosis present

## 2014-07-06 DIAGNOSIS — N184 Chronic kidney disease, stage 4 (severe): Secondary | ICD-10-CM | POA: Diagnosis present

## 2014-07-06 DIAGNOSIS — Z6828 Body mass index (BMI) 28.0-28.9, adult: Secondary | ICD-10-CM | POA: Diagnosis not present

## 2014-07-06 DIAGNOSIS — Z87891 Personal history of nicotine dependence: Secondary | ICD-10-CM | POA: Diagnosis not present

## 2014-07-06 DIAGNOSIS — M109 Gout, unspecified: Secondary | ICD-10-CM | POA: Diagnosis present

## 2014-07-06 DIAGNOSIS — E872 Acidosis: Secondary | ICD-10-CM | POA: Diagnosis present

## 2014-07-06 DIAGNOSIS — M533 Sacrococcygeal disorders, not elsewhere classified: Secondary | ICD-10-CM | POA: Diagnosis present

## 2014-07-06 DIAGNOSIS — C679 Malignant neoplasm of bladder, unspecified: Secondary | ICD-10-CM | POA: Diagnosis not present

## 2014-07-06 DIAGNOSIS — Z79899 Other long term (current) drug therapy: Secondary | ICD-10-CM | POA: Diagnosis not present

## 2014-07-06 DIAGNOSIS — Z8551 Personal history of malignant neoplasm of bladder: Secondary | ICD-10-CM | POA: Diagnosis not present

## 2014-07-06 DIAGNOSIS — N390 Urinary tract infection, site not specified: Secondary | ICD-10-CM | POA: Diagnosis present

## 2014-07-06 DIAGNOSIS — F419 Anxiety disorder, unspecified: Secondary | ICD-10-CM | POA: Diagnosis present

## 2014-07-06 DIAGNOSIS — H5500 Unspecified nystagmus: Secondary | ICD-10-CM | POA: Diagnosis present

## 2014-07-06 DIAGNOSIS — F329 Major depressive disorder, single episode, unspecified: Secondary | ICD-10-CM | POA: Diagnosis present

## 2014-07-06 DIAGNOSIS — N179 Acute kidney failure, unspecified: Secondary | ICD-10-CM | POA: Diagnosis present

## 2014-07-06 LAB — BASIC METABOLIC PANEL
ANION GAP: 4 — AB (ref 5–15)
BUN: 48 mg/dL — ABNORMAL HIGH (ref 6–23)
CO2: 12 mmol/L — ABNORMAL LOW (ref 19–32)
Calcium: 8.4 mg/dL (ref 8.4–10.5)
Chloride: 123 mmol/L — ABNORMAL HIGH (ref 96–112)
Creatinine, Ser: 2.74 mg/dL — ABNORMAL HIGH (ref 0.50–1.35)
GFR calc non Af Amer: 24 mL/min — ABNORMAL LOW (ref >90)
GFR, EST AFRICAN AMERICAN: 28 mL/min — AB (ref >90)
Glucose, Bld: 92 mg/dL (ref 70–99)
POTASSIUM: 3.3 mmol/L — AB (ref 3.5–5.1)
Sodium: 139 mmol/L (ref 135–145)

## 2014-07-06 LAB — CBC
HCT: 27.4 % — ABNORMAL LOW (ref 39.0–52.0)
Hemoglobin: 8.7 g/dL — ABNORMAL LOW (ref 13.0–17.0)
MCH: 29.2 pg (ref 26.0–34.0)
MCHC: 31.8 g/dL (ref 30.0–36.0)
MCV: 91.9 fL (ref 78.0–100.0)
Platelets: 222 10*3/uL (ref 150–400)
RBC: 2.98 MIL/uL — ABNORMAL LOW (ref 4.22–5.81)
RDW: 15.7 % — AB (ref 11.5–15.5)
WBC: 12.7 10*3/uL — ABNORMAL HIGH (ref 4.0–10.5)

## 2014-07-06 LAB — GLUCOSE, CAPILLARY
GLUCOSE-CAPILLARY: 92 mg/dL (ref 70–99)
GLUCOSE-CAPILLARY: 97 mg/dL (ref 70–99)
Glucose-Capillary: 94 mg/dL (ref 70–99)
Glucose-Capillary: 127 mg/dL — ABNORMAL HIGH (ref 70–99)

## 2014-07-06 MED ORDER — SODIUM CHLORIDE 0.9 % IV BOLUS (SEPSIS)
500.0000 mL | Freq: Once | INTRAVENOUS | Status: AC
  Administered 2014-07-06: 500 mL via INTRAVENOUS

## 2014-07-06 MED ORDER — VANCOMYCIN HCL IN DEXTROSE 750-5 MG/150ML-% IV SOLN
750.0000 mg | INTRAVENOUS | Status: DC
  Administered 2014-07-06: 750 mg via INTRAVENOUS
  Filled 2014-07-06 (×3): qty 150

## 2014-07-06 MED ORDER — POTASSIUM CHLORIDE CRYS ER 20 MEQ PO TBCR
40.0000 meq | EXTENDED_RELEASE_TABLET | Freq: Once | ORAL | Status: AC
  Administered 2014-07-06: 40 meq via ORAL
  Filled 2014-07-06: qty 2

## 2014-07-06 NOTE — Progress Notes (Signed)
Utilization Review completed.  

## 2014-07-06 NOTE — Progress Notes (Signed)
PROGRESS NOTE  [name] [name] [id number] DOB: [birth year] DOA: 07/05/2014 PCP: Florina Ou, MD  Assessment/Plan: Syncope and collapse -Admit to telemetry -CT head and C-spine unremarkable -EKG normal and initial troponin normal so low index of suspicion this is cardiogenic syncope; no reports of CP -No awareness of palpitations but will continue telemetry monitoring as above -Suspect vasovagal versus orthostasis etiology -Could be precipitated by possible urinary tract infection -IVF   Acute coccygeal pain -Likely sustained when patient fell backwards during syncopal event -Area quite tender to palpation on exam -x-ray coccyx negative -Provide adequate pain management with both oral and IV narcotics -Physical therapy evaluation   Occipital scalp laceration -Wound approximated in the ER with staples -Routine wound management   Suspected UTI (lower urinary tract infection) -Urinalysis highly abnormal and patient does have leukocytosis -Review of previous urine cultures demonstrate recurrent enterococcus( multi drug resistant) and sensitive only to vancomycin -Obtain urine culture and follow up on results -Ask pharmacy to dose empiric vancomycin  AKI on CKD (chronic kidney disease), stage IV -Renal function improved with IVF   Diabetes mellitus type 2, diet-controlled -Continue carb modified diet -Check CBGs and provide sliding scale insulin if indicated   Bladder cancer -Stable   Protein calorie malnutrition -Continue daily Nepro as per routine at home  PT- no follow up  Code Status: full Family Communication: patient Disposition Plan:    Consultants:    Procedures:      HPI/Subjective: Anxious to be d/c'd so he can go to the beach  Objective: Filed Vitals:   07/06/14 0905  BP: 102/58  Pulse: 80  Temp: 98.2 F (36.8 C)  Resp: 18    Intake/Output Summary (Last 24 hours) at 07/06/14 0933 Last data filed at 07/06/14 0906  Gross per 24  hour  Intake   4335 ml  Output   1000 ml  Net   3335 ml   Filed Weights   07/05/14 1314 07/05/14 2100  Weight: 79.379 kg (175 lb) 82.555 kg (182 lb)    Exam:   General:  A+Ox3, NAD  Cardiovascular: rrr  Respiratory: clear  Abdomen: +BS, soft  Musculoskeletal: no edema  Data Reviewed: Basic Metabolic Panel:  Recent Labs Lab 07/02/14 1031 07/05/14 1318 07/06/14 0438  NA 135 139 139  K 3.5 4.0 3.3*  CL 116* 118* 123*  CO2 16* 14* 12*  GLUCOSE 155* 177* 92  BUN 61* 54* 48*  CREATININE 3.76* 3.20* 2.74*  CALCIUM 9.3  9.2 9.5 8.4  PHOS 4.0  --   --    Liver Function Tests:  Recent Labs Lab 07/02/14 1031 07/05/14 1318  AST  --  23  ALT  --  14  ALKPHOS  --  59  BILITOT  --  0.6  PROT  --  7.3  ALBUMIN 3.6 3.7   No results for input(s): LIPASE, AMYLASE in the last 168 hours. No results for input(s): AMMONIA in the last 168 hours. CBC:  Recent Labs Lab 07/02/14 1008 07/05/14 1318 07/06/14 0438  WBC  --  14.9* 12.7*  HGB 10.9* 11.0* 8.7*  HCT  --  33.5* 27.4*  MCV  --  89.6 91.9  PLT  --  259 222   Cardiac Enzymes:  Recent Labs Lab 07/05/14 1318  TROPONINI <0.03   BNP (last 3 results) No results for input(s): BNP in the last 8760 hours.  ProBNP (last 3 results)  Recent Labs  10/30/13 1919  PROBNP 96.4    CBG:  Recent Labs  Lab 07/05/14 1742 07/05/14 2114 07/06/14 0739  GLUCAP 91 151* 92    No results found for this or any previous visit (from the past 240 hour(s)).   Studies: Dg Sacrum/coccyx  07/05/2014   CLINICAL DATA:  Syncope today.  Acute coccygeal pain.  EXAM: SACRUM AND COCCYX - 2+ VIEW  COMPARISON:  None.  FINDINGS: There is no evidence of fracture or other focal bone lesions.  IMPRESSION: Negative.   Electronically Signed   By: Andreas Newport M.D.   On: 07/05/2014 17:09   Ct Head Wo Contrast  07/05/2014   CLINICAL DATA:  Pain following fall  EXAM: CT HEAD WITHOUT CONTRAST  CT CERVICAL SPINE WITHOUT CONTRAST   TECHNIQUE: Multidetector CT imaging of the head and cervical spine was performed following the standard protocol without intravenous contrast. Multiplanar CT image reconstructions of the cervical spine were also generated.  COMPARISON:  Head CT May 02, 2013; cervical spine series October 21, 2012  FINDINGS: CT HEAD FINDINGS  The ventricles are upper normal in size for age with sulci normal in size and configuration. There is no intracranial mass, hemorrhage, extra-axial fluid collection, or midline shift. Gray-white compartments are normal. No acute infarct apparent.  There are skin staples over the right parietal bone. The bony calvarium appears intact. The mastoid air cells are clear.  CT CERVICAL SPINE FINDINGS  There is no fracture or spondylolisthesis. Prevertebral soft tissues and predental space regions are normal. There is moderately severe disc space narrowing at C5-6 and C6-7. There is slight disc space narrowing at C4-5 and C7-T1. There is facet hypertrophy at several levels bilaterally. No disc extrusion or stenosis. There is exit foraminal narrowing on the right at C5-6 and at C6-7 bilaterally, more severe on the left than on the right, due to bony hypertrophy. There is impression on exiting nerve roots at these levels.  IMPRESSION: CT head: Skin staples over the right parietal region with underlying bone appearing intact. No intracranial mass, hemorrhage, or extra-axial fluid. No evidence of acute infarct.  CT cervical spine: Multilevel osteoarthritic change as outlined above. No fracture or spondylolisthesis.   Electronically Signed   By: Lowella Grip III M.D.   On: 07/05/2014 15:24   Ct Cervical Spine Wo Contrast  07/05/2014   CLINICAL DATA:  Pain following fall  EXAM: CT HEAD WITHOUT CONTRAST  CT CERVICAL SPINE WITHOUT CONTRAST  TECHNIQUE: Multidetector CT imaging of the head and cervical spine was performed following the standard protocol without intravenous contrast. Multiplanar CT image  reconstructions of the cervical spine were also generated.  COMPARISON:  Head CT May 02, 2013; cervical spine series October 21, 2012  FINDINGS: CT HEAD FINDINGS  The ventricles are upper normal in size for age with sulci normal in size and configuration. There is no intracranial mass, hemorrhage, extra-axial fluid collection, or midline shift. Gray-white compartments are normal. No acute infarct apparent.  There are skin staples over the right parietal bone. The bony calvarium appears intact. The mastoid air cells are clear.  CT CERVICAL SPINE FINDINGS  There is no fracture or spondylolisthesis. Prevertebral soft tissues and predental space regions are normal. There is moderately severe disc space narrowing at C5-6 and C6-7. There is slight disc space narrowing at C4-5 and C7-T1. There is facet hypertrophy at several levels bilaterally. No disc extrusion or stenosis. There is exit foraminal narrowing on the right at C5-6 and at C6-7 bilaterally, more severe on the left than on the right, due to bony hypertrophy. There  is impression on exiting nerve roots at these levels.  IMPRESSION: CT head: Skin staples over the right parietal region with underlying bone appearing intact. No intracranial mass, hemorrhage, or extra-axial fluid. No evidence of acute infarct.  CT cervical spine: Multilevel osteoarthritic change as outlined above. No fracture or spondylolisthesis.   Electronically Signed   By: Lowella Grip III M.D.   On: 07/05/2014 15:24    Scheduled Meds: . citalopram  40 mg Oral Daily  . docusate sodium  100 mg Oral BID  . enoxaparin (LOVENOX) injection  30 mg Subcutaneous Q24H  . feeding supplement (NEPRO CARB STEADY)  237 mL Oral Q24H  . insulin aspart  0-5 Units Subcutaneous QHS  . insulin aspart  0-9 Units Subcutaneous TID WC  . pantoprazole  40 mg Oral BID  . sodium bicarbonate  650 mg Oral BID  . sodium chloride  3 mL Intravenous Q12H  . vancomycin  500 mg Intravenous Q24H   Continuous  Infusions: . sodium chloride 100 mL/hr at 07/05/14 1651   Antibiotics Given (last 72 hours)    None      Principal Problem:   Syncope and collapse Active Problems:   Bladder cancer   UTI (lower urinary tract infection)   Protein calorie malnutrition   CKD (chronic kidney disease), stage IV   Acute coccygeal pain   Occipital scalp laceration   Diabetes mellitus type 2, diet-controlled    Time spent: 35 min    Laurette Villescas, [facility] Pager [phone]. If 7PM-7AM, please contact night-coverage at [email or web address], password [facility] 07/06/2014, 9:33 AM

## 2014-07-06 NOTE — Evaluation (Signed)
Physical Therapy Evaluation and Discharge Patient Details Name: [name] [name] MRN: [id number] DOB: 1961/02/20 Today's Date: 07/06/2014   History of Present Illness  Pt is a 54 year old male with history of bladder cancer status post neobladder, history of chronic kidney disease stage IV with AV fistula in place, diet-controlled DM, HTN, anxiety/depression and history of protein calorie malnutrition which actually is improving. Patient was brought to the ER after experiencing a syncopal event. He was driving and noticed he was having blurred vision and then reported to his wife that everything seemed very white and bright around him. He pulled over because he felt faint. He proceeded to get out of the car before the wife could get over to him he collapsed to the ground hitting his head and sustaining an occipital laceration. His wife reports he was out for about 30-45 seconds but awakened without any focal neurological deficit.  Clinical Impression  Patient evaluated by Physical Therapy with no further acute PT needs identified. All education has been completed and the patient has no further questions. At the time of PT eval pt was mod I with mobility and at a supervision level with stair training. Pt reports he is at baseline. See below for any follow-up Physial Therapy or equipment needs. PT is signing off. Thank you for this referral.     Follow Up Recommendations No PT follow up    Equipment Recommendations  None recommended by PT    Recommendations for Other Services       Precautions / Restrictions Precautions Precautions: Fall Precaution Comments: Laceration on back of head Restrictions Weight Bearing Restrictions: No      Mobility  Bed Mobility Overal bed mobility: Modified Independent             General bed mobility comments: HOB elevated  Transfers Overall transfer level: Modified independent Equipment used: None             General transfer comment: No  unsteadiness or LOB noted.   Ambulation/Gait Ambulation/Gait assistance: Modified independent (Device/Increase time) Ambulation Distance (Feet): 525 Feet Assistive device: None Gait Pattern/deviations: WFL(Within Functional Limits) Gait velocity: Decreased Gait velocity interpretation: Below normal speed for age/gender General Gait Details: No unsteadiness or LOB noted. Pt able to accept balance challenges such as head turns (up/down and side/side) without reported dizziness or noted unsteadiness.   Stairs Stairs: Yes Stairs assistance: Supervision Stair Management: No rails Number of Stairs: 8 General stair comments: Supervision for safety. No assist required.   Wheelchair Mobility    Modified Rankin (Stroke Patients Only)       Balance Overall balance assessment: No apparent balance deficits (not formally assessed)                                           Pertinent Vitals/Pain Pain Assessment: No/denies pain    Home Living Family/patient expects to be discharged to:: Private residence Living Arrangements: Spouse/significant other;Children Available Help at Discharge: Family Type of Home: House Home Access: Stairs to enter Entrance Stairs-Rails: None Technical brewer of Steps: 3 Home Layout: One level Home Equipment: None      Prior Function Level of Independence: Independent         Comments: Does not work - on disability, however still drives and is independent.     Hand Dominance   Dominant Hand: Right    Extremity/Trunk Assessment   Upper  Extremity Assessment: Overall WFL for tasks assessed           Lower Extremity Assessment: Overall WFL for tasks assessed (Gross strength 4+/5 to 5/5 bilaterally)      Cervical / Trunk Assessment: Normal  Communication   Communication: No difficulties  Cognition Arousal/Alertness: Awake/alert Behavior During Therapy: WFL for tasks assessed/performed Overall Cognitive Status:  Within Functional Limits for tasks assessed                      General Comments      Exercises        Assessment/Plan    PT Assessment Patent does not need any further PT services  PT Diagnosis Difficulty walking   PT Problem List    PT Treatment Interventions     PT Goals (Current goals can be found in the Care Plan section) Acute Rehab PT Goals PT Goal Formulation: All assessment and education complete, DC therapy    Frequency     Barriers to discharge        Co-evaluation               End of Session Equipment Utilized During Treatment: Gait belt Activity Tolerance: Patient tolerated treatment well Patient left: in chair;with call bell/phone within reach Nurse Communication: Mobility status    Functional Assessment Tool Used: Clinical judgement Functional Limitation: Mobility: Walking and moving around Mobility: Walking and Moving Around Current Status [id number]): At least 1 percent but less than 20 percent impaired, limited or restricted Mobility: Walking and Moving Around Goal Status [phone]): At least 1 percent but less than 20 percent impaired, limited or restricted Mobility: Walking and Moving Around Discharge Status [phone]): At least 1 percent but less than 20 percent impaired, limited or restricted    Time: 0800-0824 PT Time Calculation (min) (ACUTE ONLY): 24 min   Charges:   PT Evaluation $Initial PT Evaluation Tier I: 1 Procedure PT Treatments $Gait Training: 8-22 mins   PT G Codes:   PT G-Codes **NOT FOR INPATIENT CLASS** Functional Assessment Tool Used: Clinical judgement Functional Limitation: Mobility: Walking and moving around Mobility: Walking and Moving Around Current Status [id number]): At least 1 percent but less than 20 percent impaired, limited or restricted Mobility: Walking and Moving Around Goal Status [phone]): At least 1 percent but less than 20 percent impaired, limited or restricted Mobility: Walking and Moving Around  Discharge Status [phone]): At least 1 percent but less than 20 percent impaired, limited or restricted    Rolinda Roan 07/06/2014, 8:34 AM  Rolinda Roan, PT, DPT Acute Rehabilitation Services Pager: [phone]

## 2014-07-06 NOTE — Progress Notes (Signed)
ANTIBIOTIC CONSULT NOTE - INITIAL  Pharmacy Consult for Vancomycin Indication: H/o enterococcal UTI   Allergies  Allergen Reactions  . Lisinopril Cough    Patient Measurements: Height: 5\' 10"  (177.8 cm) Weight: 182 lb (82.555 kg) IBW/kg (Calculated) : 73  Vital Signs: Temp: 98.2 F (36.8 C) (04/02 0905) Temp Source: Oral (04/02 0905) BP: 102/58 mmHg (04/02 0905) Pulse Rate: 80 (04/02 0905) Intake/Output from previous day: 04/01 0701 - 04/02 0700 In: 3855 [P.O.:580; I.V.:1275; IV Piggyback:2000] Out: 700 [Urine:700] Intake/Output from this shift: Total I/O In: 480 [P.O.:480] Out: 300 [Urine:300]  Labs:  Recent Labs  07/05/14 1318 07/06/14 0438  WBC 14.9* 12.7*  HGB 11.0* 8.7*  PLT 259 222  CREATININE 3.20* 2.74*   Estimated Creatinine Clearance: 31.5 mL/min (by C-G formula based on Cr of 2.74). No results for input(s): VANCOTROUGH, VANCOPEAK, VANCORANDOM, GENTTROUGH, GENTPEAK, GENTRANDOM, TOBRATROUGH, TOBRAPEAK, TOBRARND, AMIKACINPEAK, AMIKACINTROU, AMIKACIN in the last 72 hours.   Microbiology: No results found for this or any previous visit (from the past 720 hour(s)).  Medical History: Past Medical History  Diagnosis Date  . Gout     recent flair -bilateral feet--  STABLE  . Frequency of urination   . Urgency incontinence   . Nocturia   . Diabetes mellitus type 2, diet-controlled PT STOPPED TAKING METFORMIN--  HAS BEEN WATCHING DIET, EXERCISING AND LOSING WT  . Bladder cancer 2013  . Complication of anesthesia     agitation w/awakening in 9/13,OK 01/24/12  . Hypertension   . Anxiety   . Depression   . GERD (gastroesophageal reflux disease)   . Acute renal injury 11/2013  . Anemia   . Kidney failure     Medications:  Prescriptions prior to admission  Medication Sig Dispense Refill Last Dose  . citalopram (CELEXA) 40 MG tablet Take 40 mg by mouth daily.   07/05/2014 at Unknown time  . Nutritional Supplements (FEEDING SUPPLEMENT, NEPRO CARB STEADY,)  LIQD Take 237 mLs by mouth 3 (three) times daily between meals. 90 Can 0 07/04/2014 at Unknown time  . Nutritional Supplements (JUICE PLUS FIBRE PO) Take 1 tablet by mouth daily.   07/05/2014 at Unknown time  . pantoprazole (PROTONIX) 40 MG tablet Take 40 mg by mouth 2 (two) times daily.  6 07/05/2014 at Unknown time  . sodium bicarbonate 650 MG tablet Take 650 mg by mouth 2 (two) times daily.  6 07/05/2014 at Unknown time  . oxyCODONE-acetaminophen (ROXICET) 5-325 MG per tablet Take 1 tablet by mouth every 6 (six) hours as needed for severe pain. (Patient not taking: Reported on 07/05/2014) 20 tablet 0 Completed Course at Unknown time   Assessment: 55 YOM with a history of bladder cancer post neobladder and history of CKD stage IV with AV fistula in place for anticipated eventual HD. He was brought to the ED after collapsing to the ground and hitting his head. CT of head and spine showed no acute changes. Pharmacy consulted to start Vancomycin for a history of UTI's caused by enterococcus sensitive to Vancomycin. Patient received a 1500 mg load of vancomycin on 4/1, with plans to continue vancomycin 500 mg q24h.  SCr is improving from 3.76 to 2.74; WBC improving at 12.7. Pt is afebrile. CrCl ~ 31 mL/min.  Cultures: 4/1 Urine Cx >>IP   Goal of Therapy:  Vancomycin trough level 10-15 mcg/ml  Plan:  -Increase Vancomycin to 750 mg IV q24h -Monitor CBC, renal fx and cultures -VT at [facility]  -If renal function continues to improve on  4/3, increase lovenox to 40 mg SQ q24h  Theron Arista, PharmD Clinical Pharmacist - Resident Pager: [phone] 4/2/201612:28 PM

## 2014-07-07 DIAGNOSIS — R55 Syncope and collapse: Secondary | ICD-10-CM

## 2014-07-07 LAB — BASIC METABOLIC PANEL
Anion gap: 4 — ABNORMAL LOW (ref 5–15)
BUN: 44 mg/dL — ABNORMAL HIGH (ref 6–23)
CALCIUM: 9 mg/dL (ref 8.4–10.5)
CO2: 14 mmol/L — ABNORMAL LOW (ref 19–32)
Chloride: 119 mmol/L — ABNORMAL HIGH (ref 96–112)
Creatinine, Ser: 2.65 mg/dL — ABNORMAL HIGH (ref 0.50–1.35)
GFR calc Af Amer: 30 mL/min — ABNORMAL LOW (ref >90)
GFR, EST NON AFRICAN AMERICAN: 26 mL/min — AB (ref >90)
Glucose, Bld: 111 mg/dL — ABNORMAL HIGH (ref 70–99)
POTASSIUM: 3.4 mmol/L — AB (ref 3.5–5.1)
SODIUM: 137 mmol/L (ref 135–145)

## 2014-07-07 LAB — CBC
HCT: 33.9 % — ABNORMAL LOW (ref 39.0–52.0)
HEMOGLOBIN: 10.8 g/dL — AB (ref 13.0–17.0)
MCH: 29.7 pg (ref 26.0–34.0)
MCHC: 31.9 g/dL (ref 30.0–36.0)
MCV: 93.1 fL (ref 78.0–100.0)
Platelets: 263 10*3/uL (ref 150–400)
RBC: 3.64 MIL/uL — ABNORMAL LOW (ref 4.22–5.81)
RDW: 16 % — AB (ref 11.5–15.5)
WBC: 11.7 10*3/uL — ABNORMAL HIGH (ref 4.0–10.5)

## 2014-07-07 LAB — GLUCOSE, CAPILLARY
Glucose-Capillary: 106 mg/dL — ABNORMAL HIGH (ref 70–99)
Glucose-Capillary: 145 mg/dL — ABNORMAL HIGH (ref 70–99)

## 2014-07-07 LAB — URINE CULTURE

## 2014-07-07 MED ORDER — ENOXAPARIN SODIUM 40 MG/0.4ML ~~LOC~~ SOLN
40.0000 mg | SUBCUTANEOUS | Status: DC
  Filled 2014-07-07: qty 0.4

## 2014-07-07 NOTE — Progress Notes (Signed)
Echocardiogram 2D Echocardiogram has been performed.  [name] [name] 07/07/2014, 1:13 PM

## 2014-07-07 NOTE — Discharge Summary (Signed)
Physician Discharge Summary  [name] [name] [id number] DOB: 04/17/1960 DOA: 07/05/2014  PCP: Florina Ou, MD  Admit date: 07/05/2014 Discharge date: 07/07/2014  Time spent: greater than  30 minutes  Recommendations for Outpatient Follow-up:  1. Remove scalp staples 1 week  Discharge Diagnoses:  Principal Problem:   Syncope and collapse Active Problems:   Bladder cancer   Abnormal UA   Protein calorie malnutrition   CKD (chronic kidney disease), stage IV   Acute coccygeal pain   Occipital scalp laceration   Diabetes mellitus type 2, diet-controlled  Discharge Condition: stable  Diet recommendation: general  Filed Weights   07/05/14 1314 07/05/14 2100 07/06/14 2059  Weight: 79.379 kg (175 lb) 82.555 kg (182 lb) 89.313 kg (196 lb 14.4 oz)    History of present illness:  54 year old male patient with history of bladder cancer status post neobladder, history of chronic kidney disease stage IV with AV fistula in place for anticipated eventual hemodialysis, diet-controlled diabetes, hypertension, anxiety/depression and history of protein calorie malnutrition which actually is improving. Patient was brought to the ER after experiencing a syncopal event. He was driving and noticed he was having blurred vision and then reported to his wife that everything seemed very white and bright around him. He pulled over because he felt faint. He proceeded to get out of the car before the wife couldn't get over to him he collapsed to the ground following hitting his head and sustaining an occipital laceration. His wife reports he was out for about 30-45 seconds but awakened without any focal neurological deficit.  Evaluation in the ER revealed an afebrile normotensive patient who is not hypoxic. CT the head and cervical spine without any acute changes. The laceration was stapled by the emergency room physician. Laboratory data was unremarkable except for mildly elevated chloride, renal function was  stable and baseline, patient has chronic metabolic acidemia from chronic kidney disease, troponin was negative, patient did have white count 14,900, Hgb was stable at baseline. This patient has a history of recurrent UTIs so a urinalysis was obtained and was abnormal but difficult to determine if truly related to UTI since patient has neobladder.  Upon my evaluation I confirmed the above findings. Patient is now complaining of significant pain in the tailbone region. He has not had any recent issues regarding gastrointestinal or upper respiratory illness. He felt weaker several days ago but related this to "doing too much". He has chronic nocturia. He reports he is eating better and has decreased his Nepro to once daily. He's had no awareness of any palpitations.  Hospital Course:   Syncope and collapse -Admitted to telemetry, remained NSR -CT head and C-spine unremarkable MI ruled out -Suspect orthostatic hypotension -Could be precipitated by possible urinary tract infection Given IVF. By dishcarge, ambulating, normal vital signs and requesting discharge   Acute coccygeal pain -x-ray coccyx negative   Occipital scalp laceration -Wound approximated in the ER with staples  abnormal UA: has neobladder, so always abnormal. Started initially on empiric antibiotics. No fever, suprapubic pain. Urine culture with multiple bacterial morphotypes. abx d/cd. Doubt UTI   AKI on CKD (chronic kidney disease), stage IV -Renal function improved with IVF   Diabetes mellitus type 2, diet-controlled -Continue carb modified diet   Procedures:  none  Consultations:  none  Discharge Exam: Filed Vitals:   07/07/14 0955  BP: 119/64  Pulse: 65  Temp: 97.5 F (36.4 C)  Resp: 18    General: a and o Cardiovascular: RRR Respiratory:  CTA  Discharge Instructions   Discharge Instructions    Activity as tolerated - No restrictions    Complete by:  As directed      Diet general     Complete by:  As directed      Discharge instructions    Complete by:  As directed   Drink plenty of fluids          Current Discharge Medication List    CONTINUE these medications which have NOT CHANGED   Details  citalopram (CELEXA) 40 MG tablet Take 40 mg by mouth daily.    !! Nutritional Supplements (FEEDING SUPPLEMENT, NEPRO CARB STEADY,) LIQD Take 237 mLs by mouth 3 (three) times daily between meals. Qty: 90 Can, Refills: 0    !! Nutritional Supplements (JUICE PLUS FIBRE PO) Take 1 tablet by mouth daily.    pantoprazole (PROTONIX) 40 MG tablet Take 40 mg by mouth 2 (two) times daily. Refills: 6    sodium bicarbonate 650 MG tablet Take 650 mg by mouth 2 (two) times daily. Refills: 6     !! - Potential duplicate medications found. Please discuss with provider.    STOP taking these medications     oxyCODONE-acetaminophen (ROXICET) 5-325 MG per tablet        Allergies  Allergen Reactions  . Lisinopril Cough   Follow-up Information    Follow up with Florina Ou, MD In 1 week.   Specialty:  Family Medicine   Contact information:   [address] [place] [address] [phone]        The results of significant diagnostics from this hospitalization (including imaging, microbiology, ancillary and laboratory) are listed below for reference.    Significant Diagnostic Studies: Dg Sacrum/coccyx  07/05/2014   CLINICAL DATA:  Syncope today.  Acute coccygeal pain.  EXAM: SACRUM AND COCCYX - 2+ VIEW  COMPARISON:  None.  FINDINGS: There is no evidence of fracture or other focal bone lesions.  IMPRESSION: Negative.   Electronically Signed   By: Andreas Newport M.D.   On: 07/05/2014 17:09   Ct Head Wo Contrast  07/05/2014   CLINICAL DATA:  Pain following fall  EXAM: CT HEAD WITHOUT CONTRAST  CT CERVICAL SPINE WITHOUT CONTRAST  TECHNIQUE: Multidetector CT imaging of the head and cervical spine was performed following the standard protocol  without intravenous contrast. Multiplanar CT image reconstructions of the cervical spine were also generated.  COMPARISON:  Head CT May 02, 2013; cervical spine series October 21, 2012  FINDINGS: CT HEAD FINDINGS  The ventricles are upper normal in size for age with sulci normal in size and configuration. There is no intracranial mass, hemorrhage, extra-axial fluid collection, or midline shift. Gray-white compartments are normal. No acute infarct apparent.  There are skin staples over the right parietal bone. The bony calvarium appears intact. The mastoid air cells are clear.  CT CERVICAL SPINE FINDINGS  There is no fracture or spondylolisthesis. Prevertebral soft tissues and predental space regions are normal. There is moderately severe disc space narrowing at C5-6 and C6-7. There is slight disc space narrowing at C4-5 and C7-T1. There is facet hypertrophy at several levels bilaterally. No disc extrusion or stenosis. There is exit foraminal narrowing on the right at C5-6 and at C6-7 bilaterally, more severe on the left than on the right, due to bony hypertrophy. There is impression on exiting nerve roots at these levels.  IMPRESSION: CT head: Skin staples over the right parietal region with  underlying bone appearing intact. No intracranial mass, hemorrhage, or extra-axial fluid. No evidence of acute infarct.  CT cervical spine: Multilevel osteoarthritic change as outlined above. No fracture or spondylolisthesis.   Electronically Signed   By: Lowella Grip III M.D.   On: 07/05/2014 15:24   Ct Cervical Spine Wo Contrast  07/05/2014   CLINICAL DATA:  Pain following fall  EXAM: CT HEAD WITHOUT CONTRAST  CT CERVICAL SPINE WITHOUT CONTRAST  TECHNIQUE: Multidetector CT imaging of the head and cervical spine was performed following the standard protocol without intravenous contrast. Multiplanar CT image reconstructions of the cervical spine were also generated.  COMPARISON:  Head CT May 02, 2013; cervical spine  series October 21, 2012  FINDINGS: CT HEAD FINDINGS  The ventricles are upper normal in size for age with sulci normal in size and configuration. There is no intracranial mass, hemorrhage, extra-axial fluid collection, or midline shift. Gray-white compartments are normal. No acute infarct apparent.  There are skin staples over the right parietal bone. The bony calvarium appears intact. The mastoid air cells are clear.  CT CERVICAL SPINE FINDINGS  There is no fracture or spondylolisthesis. Prevertebral soft tissues and predental space regions are normal. There is moderately severe disc space narrowing at C5-6 and C6-7. There is slight disc space narrowing at C4-5 and C7-T1. There is facet hypertrophy at several levels bilaterally. No disc extrusion or stenosis. There is exit foraminal narrowing on the right at C5-6 and at C6-7 bilaterally, more severe on the left than on the right, due to bony hypertrophy. There is impression on exiting nerve roots at these levels.  IMPRESSION: CT head: Skin staples over the right parietal region with underlying bone appearing intact. No intracranial mass, hemorrhage, or extra-axial fluid. No evidence of acute infarct.  CT cervical spine: Multilevel osteoarthritic change as outlined above. No fracture or spondylolisthesis.   Electronically Signed   By: Lowella Grip III M.D.   On: 07/05/2014 15:24    Microbiology: Recent Results (from the past 240 hour(s))  Urine culture     Status: None   Collection Time: 07/05/14  2:38 PM  Result Value Ref Range Status   Specimen Description URINE, RANDOM  Final   Special Requests NONE  Final   Colony Count   Final    >=100,000 COLONIES/ML Performed at [facility]    Culture   Final    Multiple bacterial morphotypes present, none predominant. Suggest appropriate recollection if clinically indicated. Performed at [facility]    Report Status 07/07/2014 FINAL  Final     Labs: Basic Metabolic Panel:  Recent  Labs Lab 07/02/14 1031 07/05/14 1318 07/06/14 0438 07/07/14 0800  NA 135 139 139 137  K 3.5 4.0 3.3* 3.4*  CL 116* 118* 123* 119*  CO2 16* 14* 12* 14*  GLUCOSE 155* 177* 92 111*  BUN 61* 54* 48* 44*  CREATININE 3.76* 3.20* 2.74* 2.65*  CALCIUM 9.3  9.2 9.5 8.4 9.0  PHOS 4.0  --   --   --    Liver Function Tests:  Recent Labs Lab 07/02/14 1031 07/05/14 1318  AST  --  23  ALT  --  14  ALKPHOS  --  59  BILITOT  --  0.6  PROT  --  7.3  ALBUMIN 3.6 3.7   No results for input(s): LIPASE, AMYLASE in the last 168 hours. No results for input(s): AMMONIA in the last 168 hours. CBC:  Recent Labs Lab 07/02/14 1008 07/05/14 1318  07/06/14 0438 07/07/14 0800  WBC  --  14.9* 12.7* 11.7*  HGB 10.9* 11.0* 8.7* 10.8*  HCT  --  33.5* 27.4* 33.9*  MCV  --  89.6 91.9 93.1  PLT  --  259 222 263   Cardiac Enzymes:  Recent Labs Lab 07/05/14 1318  TROPONINI <0.03   BNP: BNP (last 3 results) No results for input(s): BNP in the last 8760 hours.  ProBNP (last 3 results)  Recent Labs  10/30/13 1919  PROBNP 96.4    CBG:  Recent Labs Lab 07/06/14 1135 07/06/14 1759 07/06/14 2104 07/07/14 0745 07/07/14 1131  GLUCAP 94 127* 97 106* 145*       Signed:  [facility] 07/07/2014, 12:03 PM

## 2014-07-08 LAB — HEMOGLOBIN A1C
Hgb A1c MFr Bld: 5.2 % (ref 4.8–5.6)
Mean Plasma Glucose: 103 mg/dL

## 2014-07-15 ENCOUNTER — Encounter: Payer: Self-pay | Admitting: Vascular Surgery

## 2014-07-16 ENCOUNTER — Ambulatory Visit (INDEPENDENT_AMBULATORY_CARE_PROVIDER_SITE_OTHER): Payer: Self-pay | Admitting: Vascular Surgery

## 2014-07-16 ENCOUNTER — Encounter: Payer: Self-pay | Admitting: Vascular Surgery

## 2014-07-16 ENCOUNTER — Ambulatory Visit (HOSPITAL_COMMUNITY)
Admission: RE | Admit: 2014-07-16 | Discharge: 2014-07-16 | Disposition: A | Discharge status: Home / Self Care | Payer: BC Managed Care – PPO | Source: Ambulatory Visit | Attending: Vascular Surgery | Admitting: Vascular Surgery

## 2014-07-16 VITALS — BP 128/72 | HR 68 | Ht 70.0 in | Wt 180.0 lb

## 2014-07-16 DIAGNOSIS — N186 End stage renal disease: Secondary | ICD-10-CM | POA: Diagnosis not present

## 2014-07-16 DIAGNOSIS — Z4931 Encounter for adequacy testing for hemodialysis: Secondary | ICD-10-CM | POA: Insufficient documentation

## 2014-07-16 DIAGNOSIS — N185 Chronic kidney disease, stage 5: Secondary | ICD-10-CM

## 2014-07-16 NOTE — Progress Notes (Signed)
Subjective:     Patient ID: [name] [name], male   DOB: 01-May-1960, 54 y.o.   MRN: [id number]  HPI this 54 year old male with chronic kidney disease stage V has not been on hemodialysis. I created a left radial-cephalic AV fistula on [id number]. He denies any pain or numbness in the left hand. He did have some redness at the proximal end of the incision but has had no chills and fever.   Review of Systems     Objective:   Physical Exam BP 128/72 mmHg  Pulse 68  Ht 5\' 10"  (1.778 m)  Wt 180 lb (81.647 kg)  BMI 25.83 kg/m2  SpO2 100%  Gen. well-developed well-nourished male no apparent stress alert and oriented 3 Left upper extremity with excellent pulse and palpable thrill in radial-cephalic AV fistula. Left hand adequately perfused. Small stitch abscess at proximal portion of incision where the Vicryl suture knot was located. Slight amount of purulence was expressed where the suture knot was located. Dressing applied.     Assessment:     Chronic kidney disease stage V with nicely functioning left radial-cephalic AV fistula-created February 26    Plan:     Okay to use left arm fistula beginning 08/29/2014  Return to see me on when necessary basis

## 2014-07-30 ENCOUNTER — Encounter (HOSPITAL_COMMUNITY)
Admission: RE | Admit: 2014-07-30 | Discharge: 2014-07-30 | Disposition: A | Discharge status: Home / Self Care | Payer: BC Managed Care – PPO | Source: Ambulatory Visit | Attending: Nephrology | Admitting: Nephrology

## 2014-07-30 DIAGNOSIS — D638 Anemia in other chronic diseases classified elsewhere: Secondary | ICD-10-CM | POA: Insufficient documentation

## 2014-07-30 DIAGNOSIS — N189 Chronic kidney disease, unspecified: Secondary | ICD-10-CM | POA: Insufficient documentation

## 2014-07-30 LAB — RENAL FUNCTION PANEL
ALBUMIN: 3.4 g/dL — AB (ref 3.5–5.2)
Anion gap: 9 (ref 5–15)
BUN: 64 mg/dL — AB (ref 6–23)
CALCIUM: 9.8 mg/dL (ref 8.4–10.5)
CHLORIDE: 113 mmol/L — AB (ref 96–112)
CO2: 13 mmol/L — AB (ref 19–32)
CREATININE: 2.85 mg/dL — AB (ref 0.50–1.35)
GFR, EST AFRICAN AMERICAN: 27 mL/min — AB (ref >90)
GFR, EST NON AFRICAN AMERICAN: 23 mL/min — AB (ref >90)
Glucose, Bld: 145 mg/dL — ABNORMAL HIGH (ref 70–99)
PHOSPHORUS: 3.7 mg/dL (ref 2.3–4.6)
Potassium: 3.8 mmol/L (ref 3.5–5.1)
SODIUM: 135 mmol/L (ref 135–145)

## 2014-07-30 LAB — IRON AND TIBC
Iron: 64 ug/dL (ref 42–165)
SATURATION RATIOS: 34 % (ref 20–55)
TIBC: 188 ug/dL — AB (ref 215–435)
UIBC: 124 ug/dL — ABNORMAL LOW (ref 125–400)

## 2014-07-30 LAB — POCT HEMOGLOBIN-HEMACUE: Hemoglobin: 10.5 g/dL — ABNORMAL LOW (ref 13.0–17.0)

## 2014-07-30 LAB — FERRITIN: FERRITIN: 785 ng/mL — AB (ref 22–322)

## 2014-07-30 MED ORDER — EPOETIN ALFA 20000 UNIT/ML IJ SOLN
20000.0000 [IU] | INTRAMUSCULAR | Status: DC
  Administered 2014-07-30: 20000 [IU] via SUBCUTANEOUS

## 2014-07-30 MED ORDER — EPOETIN ALFA 20000 UNIT/ML IJ SOLN
INTRAMUSCULAR | Status: AC
  Filled 2014-07-30: qty 1

## 2014-07-31 LAB — PTH, INTACT AND CALCIUM
CALCIUM TOTAL (PTH): 9.6 mg/dL (ref 8.7–10.2)
PTH: 6 pg/mL — ABNORMAL LOW (ref 15–65)

## 2014-08-27 ENCOUNTER — Encounter (HOSPITAL_COMMUNITY)
Admission: RE | Admit: 2014-08-27 | Discharge: 2014-08-27 | Disposition: A | Discharge status: Home / Self Care | Payer: BC Managed Care – PPO | Source: Ambulatory Visit | Attending: Nephrology | Admitting: Nephrology

## 2014-08-27 DIAGNOSIS — D638 Anemia in other chronic diseases classified elsewhere: Secondary | ICD-10-CM | POA: Insufficient documentation

## 2014-08-27 DIAGNOSIS — N179 Acute kidney failure, unspecified: Secondary | ICD-10-CM | POA: Diagnosis not present

## 2014-08-27 LAB — POCT HEMOGLOBIN-HEMACUE: Hemoglobin: 10.9 g/dL — ABNORMAL LOW (ref 13.0–17.0)

## 2014-08-27 LAB — RENAL FUNCTION PANEL
Albumin: 3.6 g/dL (ref 3.5–5.0)
Anion gap: 8 (ref 5–15)
BUN: 60 mg/dL — ABNORMAL HIGH (ref 6–20)
CO2: 17 mmol/L — ABNORMAL LOW (ref 22–32)
Calcium: 9.5 mg/dL (ref 8.9–10.3)
Chloride: 113 mmol/L — ABNORMAL HIGH (ref 101–111)
Creatinine, Ser: 2.74 mg/dL — ABNORMAL HIGH (ref 0.61–1.24)
GFR calc non Af Amer: 24 mL/min — ABNORMAL LOW (ref >60)
GFR, EST AFRICAN AMERICAN: 28 mL/min — AB (ref >60)
Glucose, Bld: 135 mg/dL — ABNORMAL HIGH (ref 65–99)
PHOSPHORUS: 3.4 mg/dL (ref 2.5–4.6)
POTASSIUM: 3.6 mmol/L (ref 3.5–5.1)
SODIUM: 138 mmol/L (ref 135–145)

## 2014-08-27 LAB — IRON AND TIBC
IRON: 92 ug/dL (ref 45–182)
SATURATION RATIOS: 40 % — AB (ref 17.9–39.5)
TIBC: 228 ug/dL — ABNORMAL LOW (ref 250–450)
UIBC: 136 ug/dL

## 2014-08-27 LAB — FERRITIN: Ferritin: 605 ng/mL — ABNORMAL HIGH (ref 24–336)

## 2014-08-27 MED ORDER — EPOETIN ALFA 20000 UNIT/ML IJ SOLN
INTRAMUSCULAR | Status: AC
  Filled 2014-08-27: qty 1

## 2014-08-27 MED ORDER — EPOETIN ALFA 20000 UNIT/ML IJ SOLN
20000.0000 [IU] | INTRAMUSCULAR | Status: DC
  Administered 2014-08-27: 20000 [IU] via SUBCUTANEOUS

## 2014-08-28 LAB — PTH, INTACT AND CALCIUM
Calcium, Total (PTH): 9.5 mg/dL (ref 8.7–10.2)
PTH: 11 pg/mL — ABNORMAL LOW (ref 15–65)

## 2014-09-23 ENCOUNTER — Other Ambulatory Visit (HOSPITAL_COMMUNITY): Payer: Self-pay

## 2014-09-24 ENCOUNTER — Encounter (HOSPITAL_COMMUNITY)
Admission: RE | Admit: 2014-09-24 | Discharge: 2014-09-24 | Disposition: A | Discharge status: Home / Self Care | Payer: BC Managed Care – PPO | Source: Ambulatory Visit | Attending: Nephrology | Admitting: Nephrology

## 2014-09-24 DIAGNOSIS — D638 Anemia in other chronic diseases classified elsewhere: Secondary | ICD-10-CM | POA: Insufficient documentation

## 2014-09-24 DIAGNOSIS — N189 Chronic kidney disease, unspecified: Secondary | ICD-10-CM | POA: Diagnosis not present

## 2014-09-24 LAB — RENAL FUNCTION PANEL
Albumin: 3.9 g/dL (ref 3.5–5.0)
Anion gap: 5 (ref 5–15)
BUN: 57 mg/dL — AB (ref 6–20)
CALCIUM: 9.2 mg/dL (ref 8.9–10.3)
CO2: 14 mmol/L — AB (ref 22–32)
CREATININE: 2.97 mg/dL — AB (ref 0.61–1.24)
Chloride: 114 mmol/L — ABNORMAL HIGH (ref 101–111)
GFR calc Af Amer: 26 mL/min — ABNORMAL LOW (ref >60)
GFR, EST NON AFRICAN AMERICAN: 22 mL/min — AB (ref >60)
Glucose, Bld: 110 mg/dL — ABNORMAL HIGH (ref 65–99)
Phosphorus: 3.8 mg/dL (ref 2.5–4.6)
Potassium: 3.6 mmol/L (ref 3.5–5.1)
Sodium: 133 mmol/L — ABNORMAL LOW (ref 135–145)

## 2014-09-24 LAB — IRON AND TIBC
IRON: 48 ug/dL (ref 45–182)
Saturation Ratios: 22 % (ref 17.9–39.5)
TIBC: 218 ug/dL — AB (ref 250–450)
UIBC: 170 ug/dL

## 2014-09-24 LAB — FERRITIN: Ferritin: 373 ng/mL — ABNORMAL HIGH (ref 24–336)

## 2014-09-24 LAB — POCT HEMOGLOBIN-HEMACUE: Hemoglobin: 10.8 g/dL — ABNORMAL LOW (ref 13.0–17.0)

## 2014-09-24 MED ORDER — EPOETIN ALFA 20000 UNIT/ML IJ SOLN
INTRAMUSCULAR | Status: AC
  Filled 2014-09-24: qty 1

## 2014-09-24 MED ORDER — EPOETIN ALFA 20000 UNIT/ML IJ SOLN
20000.0000 [IU] | INTRAMUSCULAR | Status: DC
  Administered 2014-09-24: 20000 [IU] via SUBCUTANEOUS

## 2014-09-25 LAB — PTH, INTACT AND CALCIUM
Calcium, Total (PTH): 9.5 mg/dL (ref 8.7–10.2)
PTH: 13 pg/mL — AB (ref 15–65)

## 2014-10-19 ENCOUNTER — Inpatient Hospital Stay (HOSPITAL_COMMUNITY): Payer: BC Managed Care – PPO

## 2014-10-19 ENCOUNTER — Encounter (HOSPITAL_COMMUNITY): Payer: Self-pay | Admitting: *Deleted

## 2014-10-19 ENCOUNTER — Inpatient Hospital Stay (HOSPITAL_COMMUNITY)
Admission: EM | Admit: 2014-10-19 | Discharge: 2014-10-23 | DRG: 689 | Disposition: A | Discharge status: Home / Self Care | Payer: BC Managed Care – PPO | Attending: Internal Medicine | Admitting: Internal Medicine

## 2014-10-19 DIAGNOSIS — F17220 Nicotine dependence, chewing tobacco, uncomplicated: Secondary | ICD-10-CM | POA: Diagnosis present

## 2014-10-19 DIAGNOSIS — M109 Gout, unspecified: Secondary | ICD-10-CM | POA: Diagnosis present

## 2014-10-19 DIAGNOSIS — E872 Acidosis, unspecified: Secondary | ICD-10-CM

## 2014-10-19 DIAGNOSIS — Z833 Family history of diabetes mellitus: Secondary | ICD-10-CM

## 2014-10-19 DIAGNOSIS — F419 Anxiety disorder, unspecified: Secondary | ICD-10-CM | POA: Diagnosis present

## 2014-10-19 DIAGNOSIS — I129 Hypertensive chronic kidney disease with stage 1 through stage 4 chronic kidney disease, or unspecified chronic kidney disease: Secondary | ICD-10-CM | POA: Diagnosis present

## 2014-10-19 DIAGNOSIS — N3941 Urge incontinence: Secondary | ICD-10-CM | POA: Diagnosis present

## 2014-10-19 DIAGNOSIS — I2510 Atherosclerotic heart disease of native coronary artery without angina pectoris: Secondary | ICD-10-CM | POA: Diagnosis present

## 2014-10-19 DIAGNOSIS — F329 Major depressive disorder, single episode, unspecified: Secondary | ICD-10-CM | POA: Diagnosis present

## 2014-10-19 DIAGNOSIS — E1122 Type 2 diabetes mellitus with diabetic chronic kidney disease: Secondary | ICD-10-CM | POA: Diagnosis present

## 2014-10-19 DIAGNOSIS — C679 Malignant neoplasm of bladder, unspecified: Secondary | ICD-10-CM | POA: Diagnosis not present

## 2014-10-19 DIAGNOSIS — Z79899 Other long term (current) drug therapy: Secondary | ICD-10-CM | POA: Diagnosis not present

## 2014-10-19 DIAGNOSIS — E11649 Type 2 diabetes mellitus with hypoglycemia without coma: Secondary | ICD-10-CM | POA: Diagnosis present

## 2014-10-19 DIAGNOSIS — R351 Nocturia: Secondary | ICD-10-CM | POA: Diagnosis present

## 2014-10-19 DIAGNOSIS — N390 Urinary tract infection, site not specified: Secondary | ICD-10-CM | POA: Diagnosis not present

## 2014-10-19 DIAGNOSIS — R350 Frequency of micturition: Secondary | ICD-10-CM | POA: Diagnosis present

## 2014-10-19 DIAGNOSIS — N1330 Unspecified hydronephrosis: Secondary | ICD-10-CM | POA: Diagnosis present

## 2014-10-19 DIAGNOSIS — K219 Gastro-esophageal reflux disease without esophagitis: Secondary | ICD-10-CM | POA: Diagnosis present

## 2014-10-19 DIAGNOSIS — I959 Hypotension, unspecified: Secondary | ICD-10-CM | POA: Diagnosis present

## 2014-10-19 DIAGNOSIS — Z888 Allergy status to other drugs, medicaments and biological substances status: Secondary | ICD-10-CM

## 2014-10-19 DIAGNOSIS — E876 Hypokalemia: Secondary | ICD-10-CM | POA: Diagnosis not present

## 2014-10-19 DIAGNOSIS — N179 Acute kidney failure, unspecified: Secondary | ICD-10-CM

## 2014-10-19 DIAGNOSIS — D638 Anemia in other chronic diseases classified elsewhere: Secondary | ICD-10-CM | POA: Insufficient documentation

## 2014-10-19 DIAGNOSIS — G9340 Encephalopathy, unspecified: Secondary | ICD-10-CM | POA: Diagnosis present

## 2014-10-19 DIAGNOSIS — Z8249 Family history of ischemic heart disease and other diseases of the circulatory system: Secondary | ICD-10-CM | POA: Diagnosis not present

## 2014-10-19 DIAGNOSIS — R410 Disorientation, unspecified: Secondary | ICD-10-CM

## 2014-10-19 DIAGNOSIS — E785 Hyperlipidemia, unspecified: Secondary | ICD-10-CM | POA: Diagnosis present

## 2014-10-19 DIAGNOSIS — Z8551 Personal history of malignant neoplasm of bladder: Secondary | ICD-10-CM

## 2014-10-19 DIAGNOSIS — N189 Chronic kidney disease, unspecified: Secondary | ICD-10-CM

## 2014-10-19 DIAGNOSIS — N184 Chronic kidney disease, stage 4 (severe): Secondary | ICD-10-CM | POA: Diagnosis present

## 2014-10-19 DIAGNOSIS — Z87440 Personal history of urinary (tract) infections: Secondary | ICD-10-CM

## 2014-10-19 DIAGNOSIS — R4182 Altered mental status, unspecified: Secondary | ICD-10-CM | POA: Diagnosis present

## 2014-10-19 DIAGNOSIS — B9620 Unspecified Escherichia coli [E. coli] as the cause of diseases classified elsewhere: Secondary | ICD-10-CM | POA: Diagnosis present

## 2014-10-19 DIAGNOSIS — Z906 Acquired absence of other parts of urinary tract: Secondary | ICD-10-CM | POA: Diagnosis present

## 2014-10-19 DIAGNOSIS — E119 Type 2 diabetes mellitus without complications: Secondary | ICD-10-CM

## 2014-10-19 DIAGNOSIS — F418 Other specified anxiety disorders: Secondary | ICD-10-CM | POA: Diagnosis present

## 2014-10-19 DIAGNOSIS — B961 Klebsiella pneumoniae [K. pneumoniae] as the cause of diseases classified elsewhere: Secondary | ICD-10-CM | POA: Diagnosis present

## 2014-10-19 LAB — CBC WITH DIFFERENTIAL/PLATELET
BASOS ABS: 0 10*3/uL (ref 0.0–0.1)
BASOS PCT: 0 % (ref 0–1)
EOS PCT: 1 % (ref 0–5)
Eosinophils Absolute: 0.1 10*3/uL (ref 0.0–0.7)
HEMATOCRIT: 33.2 % — AB (ref 39.0–52.0)
Hemoglobin: 10.6 g/dL — ABNORMAL LOW (ref 13.0–17.0)
LYMPHS PCT: 13 % (ref 12–46)
Lymphs Abs: 1.4 10*3/uL (ref 0.7–4.0)
MCH: 28.3 pg (ref 26.0–34.0)
MCHC: 31.9 g/dL (ref 30.0–36.0)
MCV: 88.5 fL (ref 78.0–100.0)
MONO ABS: 0.5 10*3/uL (ref 0.1–1.0)
Monocytes Relative: 5 % (ref 3–12)
Neutro Abs: 8.3 10*3/uL — ABNORMAL HIGH (ref 1.7–7.7)
Neutrophils Relative %: 81 % — ABNORMAL HIGH (ref 43–77)
PLATELETS: 269 10*3/uL (ref 150–400)
RBC: 3.75 MIL/uL — ABNORMAL LOW (ref 4.22–5.81)
RDW: 15.4 % (ref 11.5–15.5)
WBC: 10.3 10*3/uL (ref 4.0–10.5)

## 2014-10-19 LAB — URINALYSIS, ROUTINE W REFLEX MICROSCOPIC
Glucose, UA: NEGATIVE mg/dL
Ketones, ur: NEGATIVE mg/dL
Nitrite: NEGATIVE
Protein, ur: 30 mg/dL — AB
Specific Gravity, Urine: 1.008 (ref 1.005–1.030)
Urobilinogen, UA: 1 mg/dL (ref 0.0–1.0)
pH: 7 (ref 5.0–8.0)

## 2014-10-19 LAB — BLOOD GAS, VENOUS
Acid-base deficit: 19.1 mmol/L — ABNORMAL HIGH (ref 0.0–2.0)
Bicarbonate: 9.5 mEq/L — ABNORMAL LOW (ref 20.0–24.0)
Drawn by: 103701
O2 Saturation: 65.6 %
PCO2 VEN: 30.7 mmHg — AB (ref 45.0–50.0)
PH VEN: 7.119 — AB (ref 7.250–7.300)
PO2 VEN: 37.7 mmHg (ref 30.0–45.0)
Patient temperature: 98.6
TCO2: 9.1 mmol/L (ref 0–100)

## 2014-10-19 LAB — RAPID URINE DRUG SCREEN, HOSP PERFORMED
Amphetamines: NOT DETECTED
Barbiturates: NOT DETECTED
Benzodiazepines: NOT DETECTED
Cocaine: NOT DETECTED
Opiates: NOT DETECTED
Tetrahydrocannabinol: NOT DETECTED

## 2014-10-19 LAB — COMPREHENSIVE METABOLIC PANEL WITH GFR
ALT: 17 U/L (ref 17–63)
AST: 15 U/L (ref 15–41)
Albumin: 4.1 g/dL (ref 3.5–5.0)
Alkaline Phosphatase: 59 U/L (ref 38–126)
Anion gap: 9 (ref 5–15)
BUN: 71 mg/dL — ABNORMAL HIGH (ref 6–20)
CO2: 9 mmol/L — ABNORMAL LOW (ref 22–32)
Calcium: 9.7 mg/dL (ref 8.9–10.3)
Chloride: 119 mmol/L — ABNORMAL HIGH (ref 101–111)
Creatinine, Ser: 3.49 mg/dL — ABNORMAL HIGH (ref 0.61–1.24)
GFR calc Af Amer: 21 mL/min — ABNORMAL LOW
GFR calc non Af Amer: 18 mL/min — ABNORMAL LOW
Glucose, Bld: 123 mg/dL — ABNORMAL HIGH (ref 65–99)
Potassium: 3.5 mmol/L (ref 3.5–5.1)
Sodium: 137 mmol/L (ref 135–145)
Total Bilirubin: 0.7 mg/dL (ref 0.3–1.2)
Total Protein: 7.8 g/dL (ref 6.5–8.1)

## 2014-10-19 LAB — URINE MICROSCOPIC-ADD ON

## 2014-10-19 LAB — GLUCOSE, CAPILLARY: Glucose-Capillary: 70 mg/dL (ref 65–99)

## 2014-10-19 LAB — LACTIC ACID, PLASMA
LACTIC ACID, VENOUS: 1.1 mmol/L (ref 0.5–2.0)
Lactic Acid, Venous: 0.8 mmol/L (ref 0.5–2.0)

## 2014-10-19 LAB — MRSA PCR SCREENING: MRSA BY PCR: NEGATIVE

## 2014-10-19 LAB — CK: CK TOTAL: 76 U/L (ref 49–397)

## 2014-10-19 MED ORDER — ACETAMINOPHEN 325 MG PO TABS
650.0000 mg | ORAL_TABLET | ORAL | Status: DC | PRN
  Filled 2014-10-19: qty 2

## 2014-10-19 MED ORDER — ONDANSETRON HCL 4 MG PO TABS: 4.0000 mg | ORAL_TABLET | Freq: Four times a day (QID) | ORAL | Status: DC | PRN

## 2014-10-19 MED ORDER — HEPARIN SODIUM (PORCINE) 5000 UNIT/ML IJ SOLN
5000.0000 [IU] | Freq: Three times a day (TID) | INTRAMUSCULAR | Status: DC
  Administered 2014-10-19 – 2014-10-23 (×11): 5000 [IU] via SUBCUTANEOUS
  Filled 2014-10-19 (×14): qty 1

## 2014-10-19 MED ORDER — SODIUM CHLORIDE 0.9 % IV BOLUS (SEPSIS)
500.0000 mL | Freq: Once | INTRAVENOUS | Status: AC
  Administered 2014-10-19: 500 mL via INTRAVENOUS

## 2014-10-19 MED ORDER — ZIPRASIDONE MESYLATE 20 MG IM SOLR
INTRAMUSCULAR | Status: AC
  Filled 2014-10-19: qty 20

## 2014-10-19 MED ORDER — SODIUM CHLORIDE 0.9 % IV BOLUS (SEPSIS): 1000.0000 mL | Freq: Once | INTRAVENOUS | Status: AC

## 2014-10-19 MED ORDER — STERILE WATER FOR INJECTION IJ SOLN
INTRAMUSCULAR | Status: AC
  Filled 2014-10-19: qty 10

## 2014-10-19 MED ORDER — ONDANSETRON HCL 4 MG/2ML IJ SOLN: 4.0000 mg | Freq: Four times a day (QID) | INTRAMUSCULAR | Status: DC | PRN

## 2014-10-19 MED ORDER — NICOTINE 21 MG/24HR TD PT24: 21.0000 mg | MEDICATED_PATCH | Freq: Every day | TRANSDERMAL | Status: DC

## 2014-10-19 MED ORDER — LORAZEPAM 1 MG PO TABS: 1.0000 mg | ORAL_TABLET | Freq: Three times a day (TID) | ORAL | Status: DC | PRN

## 2014-10-19 MED ORDER — SODIUM CHLORIDE 0.9 % IV BOLUS (SEPSIS)
1000.0000 mL | Freq: Once | INTRAVENOUS | Status: AC
Start: 2014-10-19 — End: 2014-10-19
  Administered 2014-10-19: 1000 mL via INTRAVENOUS

## 2014-10-19 MED ORDER — STERILE WATER FOR INJECTION IV SOLN
150.0000 meq | INTRAVENOUS | Status: DC
  Administered 2014-10-19 – 2014-10-20 (×2): 150 meq via INTRAVENOUS
  Filled 2014-10-19 (×3): qty 850

## 2014-10-19 MED ORDER — ZIPRASIDONE MESYLATE 20 MG IM SOLR
20.0000 mg | Freq: Once | INTRAMUSCULAR | Status: AC
  Administered 2014-10-19: 20 mg via INTRAMUSCULAR

## 2014-10-19 NOTE — ED Notes (Signed)
I hve attempted to call report.  [facility] will call back shortly for report.

## 2014-10-19 NOTE — Progress Notes (Signed)
Pt is sleepy but talking.  CBG 70 and hypotensive. Pt ordered NPO, received orders for clears  And bolus from K. Black NP.  Pt alert, sitting completely upright and drank Sprite through a straw.  He did have some coughing noted after and said that it had been gong on apprx 3 months that he would cough after drinking. Pt also refused foley ordered by Dr. Darrick Meigs.  NP aware as well.  Pt is making urine on his own.

## 2014-10-19 NOTE — ED Notes (Signed)
He remains awake and is speaking with police who are at his bedside.  He is in no distress.

## 2014-10-19 NOTE — ED Notes (Signed)
Family brought pt to hospital due to not taking his pschy meds for 6 days, history of depression, bladder cancer, renal failure. Pt has been ranting and raving with hyper-religious comments, standing in parking lot near entrance refusing to come in, wife and daughter present, RN explained the need for IVC papers. Patient will not get back in vehicle nor come inside. Dr Kathrynn Humble to assist, after conversation with pt and wife, emergency IVC papers started. Pt taken down and placed on stretcher by security and [employer]. 4 point restraints placed, pt continues to be verbal. Taken to room. Medication given to assist in relaxing.

## 2014-10-19 NOTE — ED Notes (Signed)
He remains very resistive of care and refuses IV stick.  I was barely able to obtain pulse oxymetry.

## 2014-10-19 NOTE — ED Notes (Signed)
I have just phoned report to [facility], RN in ICU and will transport shortly.  Dr. Arty Baumgartner has seen pt. In E.D. (~15 min. Ago).  Monitor shows nsr without ectopy.

## 2014-10-19 NOTE — Consult Note (Signed)
Reason for Consult:AKI/CKD, severe metabolic acidosis Referring Physician: Darrick Meigs, MD  [name] [name] is an 54 y.o. male.  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 He presented to [facility] with AMS, found to have severe metabolic acidosis with CO2 of 9, and an increase in Scr to 3.49 from his baseline.  He was seen in consultation by our group (Dr. Posey Pronto) on 11/28/14 with similar presentation related to a UTI and volume depletion as well as left hydronephrosis due to a poorly functioning stent. We were asked again to evaluate and help manage his AKI/CKD as well as metabolic acidosis.  He did have an AVF placed by Dr. Kellie Simmering on 05/31/14 due to his advanced CKD however he has not needed to initiate dialysis. The trend in Scr is seen below.   Trend in Creatinine:  CREATININE, SER  Date/Time Value Ref Range Status  10/19/2014 02:01 PM 3.49* 0.61 - 1.24 mg/dL Final  09/24/2014 09:01 AM 2.97* 0.61 - 1.24 mg/dL Final  08/27/2014 09:30 AM 2.74* 0.61 - 1.24 mg/dL Final  07/30/2014 10:53 AM 2.85* 0.50 - 1.35 mg/dL Final  07/07/2014 08:00 AM 2.65* 0.50 - 1.35 mg/dL Final  07/06/2014 04:38 AM 2.74* 0.50 - 1.35 mg/dL Final  07/05/2014 01:18 PM 3.20* 0.50 - 1.35 mg/dL Final  07/02/2014 10:31 AM 3.76* 0.50 - 1.35 mg/dL Final  06/04/2014 11:04 AM 2.50* 0.50 - 1.35 mg/dL Final  05/06/2014 10:28 AM 2.23* 0.50 - 1.35 mg/dL Final  08/29/2013 07:47 PM 2.53* 0.50 - 1.35 mg/dL Final  07/24/2013 07:40 PM 2.56* 0.50 - 1.35 mg/dL Final  03/26/2014 09:25 AM 2.72* 0.50 - 1.35 mg/dL Final  02/26/2014 09:34 AM 2.78* 0.50 - 1.35 mg/dL Final  01/15/2014 11:07 AM 2.29* 0.50 - 1.35 mg/dL Final  12/04/2013 10:52 AM 2.30* 0.50 - 1.35  mg/dL Final  12/01/2013 06:07 AM 2.37* 0.50 - 1.35 mg/dL Final  11/30/2013 07:40 AM 2.84* 0.50 - 1.35 mg/dL Final  11/29/2013 06:59 AM 2.92* 0.50 - 1.35 mg/dL Final  11/28/2013 04:12 PM 3.19* 0.50 - 1.35 mg/dL Final  11/28/2013 07:21 AM 3.31* 0.50 - 1.35 mg/dL Final  11/27/2013 06:29 PM 3.25* 0.50 - 1.35 mg/dL Final  11/27/2013 12:15 PM 3.42* 0.50 - 1.35 mg/dL Final  11/03/2013 02:58 AM 2.42* 0.50 - 1.35 mg/dL Final  11/02/2013 05:24 AM 3.02* 0.50 - 1.35 mg/dL Final  11/01/2013 06:50 AM 2.94* 0.50 - 1.35 mg/dL Final  10/31/2013 06:41 AM 3.13* 0.50 - 1.35 mg/dL Final  10/30/2013 07:19 PM 2.81* 0.50 - 1.35 mg/dL Final  10/30/2013 04:15 PM 3.13* 0.50 - 1.35 mg/dL Final  08/29/2013 10:51 PM 2.58* 0.50 - 1.35 mg/dL Final  06/28/2013 03:53 AM 2.24* 0.50 - 1.35 mg/dL Final  06/27/2013 04:10 AM 2.19* 0.50 - 1.35 mg/dL Final  06/26/2013 04:59 PM 2.36* 0.50 - 1.35 mg/dL Final  06/26/2013 04:14 AM 2.40* 0.50 - 1.35 mg/dL Final  06/25/2013 07:50 PM 2.54* 0.50 - 1.35 mg/dL Final  05/30/2013 06:42 PM 1.80* 0.50 - 1.35 mg/dL Final  05/30/2013 06:25 PM 1.57* 0.50 - 1.35 mg/dL Final  05/25/2013 02:39 AM 1.53* 0.50 - 1.35 mg/dL Final  05/24/2013 02:51 AM 1.71* 0.50 - 1.35 mg/dL Final  05/23/2013 03:17 AM 2.18* 0.50 - 1.35 mg/dL Final  05/22/2013 03:17 AM 2.53* 0.50 -  1.35 mg/dL Final  05/21/2013 03:55 PM 3.10* 0.50 - 1.35 mg/dL Final  05/21/2013 03:27 PM 2.86* 0.50 - 1.35 mg/dL Final  05/13/2013 05:30 AM 1.89* 0.50 - 1.35 mg/dL Final  05/12/2013 04:59 AM 1.66* 0.50 - 1.35 mg/dL Final  05/11/2013 03:38 AM 1.81* 0.50 - 1.35 mg/dL Final  05/10/2013 11:30 AM 1.96* 0.50 - 1.35 mg/dL Final  05/09/2013 01:49 PM 2.16* 0.50 - 1.35 mg/dL Final  10/28/2009 09:30 PM 1.01 0.4 - 1.5 mg/dL Final    PMH:   Past Medical History  Diagnosis Date  . Gout     recent flair -bilateral feet--  STABLE  . Frequency of urination   . Urgency incontinence   . Nocturia   . Diabetes mellitus type 2, diet-controlled PT  STOPPED TAKING METFORMIN--  HAS BEEN WATCHING DIET, EXERCISING AND LOSING WT  . Bladder cancer 2013  . Complication of anesthesia     agitation w/awakening in 9/13,OK 01/24/12  . Hypertension   . Anxiety   . Depression   . GERD (gastroesophageal reflux disease)   . Acute renal injury 11/2013  . Anemia   . Kidney failure     PSH:   Past Surgical History  Procedure Laterality Date  . Ulner artery repair Right 2009    rt -trauma /thrombectomy,repair  . Transurethral resection of bladder tumor  12/15/2011    Procedure: TRANSURETHRAL RESECTION OF BLADDER TUMOR (TURBT);  Surgeon: Molli Hazard, MD;  Location: [facility];  Service: Urology;  Laterality: N/A;  2 HRS   . Lumbar laminectomy  06-02-2005    W/ RESECTION NERVE ROOT  L4  -  L5  . Transurethral resection of bladder tumor  01/24/2012    Procedure: TRANSURETHRAL RESECTION OF BLADDER TUMOR (TURBT);  Surgeon: Molli Hazard, MD;  Location: [facility];  Service: Urology;  Laterality: N/A;  90 MIN   . Cystoscopy with urethral dilatation  01/24/2012    Procedure: CYSTOSCOPY WITH URETHRAL DILATATION;  Surgeon: Molli Hazard, MD;  Location: [facility];  Service: Urology;  Laterality: N/A;  . Transurethral resection of bladder tumor N/A 07/10/2012    Procedure: TRANSURETHRAL RESECTION OF BLADDER TUMOR (TURBT);  Surgeon: Molli Hazard, MD;  Location: [facility];  Service: Urology;  Laterality: N/A;  . Cystoscopy w/ retrogrades Bilateral 07/10/2012    Procedure: CYSTOSCOPY WITH RETROGRADE PYELOGRAM;  Surgeon: Molli Hazard, MD;  Location: [facility];  Service: Urology;  Laterality: Bilateral;  . Back surgery    . Bladder surgery  04/26/2013    at the cancer treatment [facility] [place] in  [place]    . Av fistula placement Left 05/31/2014    Procedure: LEFT RADIOCEPHALIC ARTERIOVENOUS (AV) FISTULA CREATION ;  Surgeon: Mal Misty, MD;  Location: [place];  Service: Vascular;  Laterality: Left;    Allergies:  Allergies  Allergen Reactions  . Lisinopril Cough    Medications:   Prior to Admission medications   Medication Sig Start Date End Date Taking? Authorizing Provider  cetirizine (ZYRTEC) 10 MG tablet Take 10 mg by mouth daily as needed for allergies.   Yes Historical Provider, MD  citalopram (CELEXA) 40 MG tablet Take 40 mg by mouth every morning.    Yes Historical Provider, MD  epoetin alfa (EPOGEN,PROCRIT) 91478 UNIT/ML injection Inject 20,000 Units into the skin every 28 (twenty-eight) days. Every four weeks   Yes Historical Provider, MD  Nutritional Supplements (FEEDING SUPPLEMENT, NEPRO CARB STEADY,)  LIQD Take 237 mLs by mouth 3 (three) times daily between meals. 12/01/13  Yes Shanker Kristeen Mans, MD  pantoprazole (PROTONIX) 40 MG tablet Take 80 mg by mouth daily.  05/11/14  Yes Historical Provider, MD  sodium bicarbonate 650 MG tablet Take 1,300 mg by mouth daily.  05/11/14  Yes Historical Provider, MD    Inpatient medications: . nicotine  21 mg Transdermal Daily  . sterile water (preservative free)        Discontinued Meds:   Medications Discontinued During This Encounter  Medication Reason  . Nutritional Supplements (JUICE PLUS FIBRE PO) Patient Preference  . sodium bicarbonate [id number] MG tablet Duplicate  . citalopram (CELEXA) 40 MG tablet Duplicate  . omeprazole (PRILOSEC) 40 MG capsule Discontinued by provider    Social History:  reports that he quit smoking about 22 years ago. His smoking use included Cigarettes. He has a 16 pack-year smoking history. He quit smokeless tobacco use about 22 years ago. His smokeless tobacco use included Chew. He reports that he does not drink alcohol or use illicit drugs.  Family History:   Family History  Problem Relation Age of Onset  . Diabetes Mellitus II Other   . Hypertension Other   . Bladder Cancer Neg Hx     Pertinent items are noted in HPI. Weight  change:   Intake/Output Summary (Last 24 hours) at 10/19/14 1628 Last data filed at 10/19/14 1459  Gross per 24 hour  Intake      0 ml  Output    400 ml  Net   -400 ml   BP 97/49 mmHg  Pulse 73  Resp 15  SpO2 99% Filed Vitals:   10/19/14 1347 10/19/14 1444 10/19/14 1534  BP: 111/56 120/64 97/49  Pulse: 96 90 73  Resp: 16 16 15   SpO2: 99% 100% 99%     General appearance: delirious and somnolent Head: Normocephalic, without obvious abnormality, atraumatic Eyes: negative findings: lids and lashes normal, conjunctivae and sclerae normal and corneas clear Neck: no adenopathy, no carotid bruit, no JVD, supple, symmetrical, trachea midline and thyroid not enlarged, symmetric, no tenderness/mass/nodules Resp: clear to auscultation bilaterally Cardio: regular rate and rhythm and no rub GI: soft, non-tender; bowel sounds normal; no masses,  no organomegaly Extremities: extremities normal, atraumatic, no cyanosis or edema and Left forearm AVF +T/B  Labs: Basic Metabolic Panel:  Recent Labs Lab 10/19/14 1401  NA 137  K 3.5  CL 119*  CO2 9*  GLUCOSE 123*  BUN 71*  CREATININE 3.49*  ALBUMIN 4.1  CALCIUM 9.7   Liver Function Tests:  Recent Labs Lab 10/19/14 1401  AST 15  ALT 17  ALKPHOS 59  BILITOT 0.7  PROT 7.8  ALBUMIN 4.1   No results for input(s): LIPASE, AMYLASE in the last 168 hours. No results for input(s): AMMONIA in the last 168 hours. CBC:  Recent Labs Lab 10/19/14 1401  WBC 10.3  NEUTROABS 8.3*  HGB 10.6*  HCT 33.2*  MCV 88.5  PLT 269   PT/INR: @LABRCNTIP (inr:5) Cardiac Enzymes: )No results for input(s): CKTOTAL, CKMB, CKMBINDEX, TROPONINI in the last 168 hours. CBG: No results for input(s): GLUCAP in the last 168 hours.  Iron Studies: No results for input(s): IRON, TIBC, TRANSFERRIN, FERRITIN in the last 168 hours.  Xrays/Other Studies: Dg Chest 2 View  10/19/2014   CLINICAL DATA:  54 year old male with delirium  EXAM: CHEST  2 VIEW   COMPARISON:  Chest radiograph dated 10/30/2013  FINDINGS: The heart size and mediastinal  contours are within normal limits. Both lungs are clear. The visualized skeletal structures are unremarkable.  IMPRESSION: No active cardiopulmonary disease.   Electronically Signed   By: Anner Crete M.D.   On: 10/19/2014 17:09   Ct Head Wo Contrast  10/19/2014   CLINICAL DATA:  Altered mental status  EXAM: CT HEAD WITHOUT CONTRAST  TECHNIQUE: Contiguous axial images were obtained from the base of the skull through the vertex without intravenous contrast.  COMPARISON:  07/05/2014  FINDINGS: No evidence of parenchymal hemorrhage or extra-axial fluid collection. No mass lesion, mass effect, or midline shift.  No CT evidence of acute infarction.  Cerebral volume is within normal limits.  No ventriculomegaly.  The visualized paranasal sinuses are essentially clear. The mastoid air cells are unopacified.  No evidence of calvarial fracture.  IMPRESSION: No evidence of acute intracranial abnormality.   Electronically Signed   By: Julian Hy M.D.   On: 10/19/2014 17:05   US Renal  10/19/2014   CLINICAL DATA:  Acute renal failure  EXAM: RENAL / URINARY TRACT ULTRASOUND COMPLETE  COMPARISON:  01/11/2014  FINDINGS: Right Kidney:  Length: 11.4 cm. Mild hydronephrosis is again seen and stable from the prior exam. This may be the patient's current steady state  Left Kidney:  Length: 11.6 cm. Echogenicity within normal limits. No mass or hydronephrosis visualized.  Bladder:  Neobladder is noted.  IMPRESSION: Mild right-sided hydronephrosis stable from the prior exam. No other focal abnormality is seen.   Electronically Signed   By: Inez Catalina M.D.   On: 10/19/2014 18:10     Assessment/Plan: 1.  AKI/CKD- in setting of hypotension and AMS.  Worrisome for recurrence of possible urosepsis vs. Volume depletion.  Has h/o similar episode in August 2015 which responded to IVF's and antibiotics.  Renal US without change in mild  hydronephrosis on right.   1. Start IVF's and follow UOP and Scr 2. No indication for dialysis at this time.  Cont to follow  2. Metabolic acidosis- pt with chronic metabolic acidosis related to CKD and neobladder/tubular acidosis.  On chronic replacement but will require IV isotonic bicarb.  Cont to follow 3. AMS- CT scan of head negative.  Possibly related to metabolic encephalopathy, doubt uremia given Scr not much off baseline.  Also need to r/o UTI or other infectious cause.  No history of ingestion of Etoh or other intoxicants.  tox screen pending.  Neuro exam nonfocal. 4. Vascular access- L AVF +T/B mature and ready for use when needed. 5. DM- not on metformin. Management per primary svc.   [place] A 10/19/2014, 4:28 PM

## 2014-10-19 NOTE — H&P (Addendum)
PCP:   Florina Ou, MD   Chief Complaint:  Altered mental status  HPI:  54 year old male who  has a past medical history of Gout; Frequency of urination; Urgency incontinence; Nocturia; Diabetes mellitus type 2, diet-controlled (PT STOPPED TAKING METFORMIN--  HAS BEEN WATCHING DIET, EXERCISING AND LOSING WT); Bladder cancer (2013); Complication of anesthesia; Hypertension; Anxiety; Depression; GERD (gastroesophageal reflux disease); Acute renal injury (11/2013); Anemia; and Kidney failure. Today was brought to the hospital for altered mental status. Patient has a history of bladder cancer status post neobladder, history of CAD stage IV with AV fistula in place in anticipation for eventual dialysis. Also has diet-controlled abdomen is mellitus. Patient is unable to provide significant history, very somnolent. As per the notes patient wife stated that patient was talking nonstop since yesterday and was talking what gotten Bible the whole time. 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 Patient has had poor by mouth intake yesterday. In the ED patient was found to have severe metabolic acidosis with CO2 9. He has been taking sodium bicarbonate tablets at home and his bicarbonate chronically has been around 13-14. ABG (venous sample)  showed pH of 7.119, PCO2 30.7, PO2 37.7. Urine drug screen and UA are pending. He is afebrile. Review of systems unobtainable due to altered mental status.  Allergies:   Allergies  Allergen Reactions  . Lisinopril Cough      Past Medical History  Diagnosis Date  . Gout     recent flair -bilateral feet--  STABLE  . Frequency of urination   . Urgency incontinence   . Nocturia   . Diabetes mellitus type 2, diet-controlled PT STOPPED TAKING METFORMIN--  HAS BEEN WATCHING DIET, EXERCISING AND LOSING WT  . Bladder cancer 2013  . Complication of anesthesia     agitation w/awakening in 9/13,OK 01/24/12  . Hypertension   . Anxiety   .  Depression   . GERD (gastroesophageal reflux disease)   . Acute renal injury 11/2013  . Anemia   . Kidney failure     Past Surgical History  Procedure Laterality Date  . Ulner artery repair Right 2009    rt -trauma /thrombectomy,repair  . Transurethral resection of bladder tumor  12/15/2011    Procedure: TRANSURETHRAL RESECTION OF BLADDER TUMOR (TURBT);  Surgeon: Molli Hazard, MD;  Location: [facility];  Service: Urology;  Laterality: N/A;  2 HRS   . Lumbar laminectomy  06-02-2005    W/ RESECTION NERVE ROOT  L4  -  L5  . Transurethral resection of bladder tumor  01/24/2012    Procedure: TRANSURETHRAL RESECTION OF BLADDER TUMOR (TURBT);  Surgeon: Molli Hazard, MD;  Location: [facility];  Service: Urology;  Laterality: N/A;  90 MIN   . Cystoscopy with urethral dilatation  01/24/2012    Procedure: CYSTOSCOPY WITH URETHRAL DILATATION;  Surgeon: Molli Hazard, MD;  Location[facility];  Service: Urology;  Laterality: N/A;  . Transurethral resection of bladder tumor N/A 07/10/2012    Procedure: TRANSURETHRAL RESECTION OF BLADDER TUMOR (TURBT);  Surgeon: Molli Hazard, MD;  Location: [facility];  Service: Urology;  Laterality: N/A;  . Cystoscopy w/ retrogrades Bilateral 07/10/2012    Procedure: CYSTOSCOPY WITH RETROGRADE PYELOGRAM;  Surgeon: Molli Hazard, MD;  Location: [facility];  Service: Urology;  Laterality: Bilateral;  . Back surgery    . Bladder surgery  04/26/2013    at the cancer  treatment center of [place] in  [place]    . Av fistula placement Left 05/31/2014    Procedure: LEFT RADIOCEPHALIC ARTERIOVENOUS (AV) FISTULA CREATION ;  Surgeon: Mal Misty, MD;  Location: [place];  Service: Vascular;  Laterality: Left;    Prior to Admission medications   Medication Sig Start Date End Date Taking? Authorizing Provider  cetirizine (ZYRTEC) 10 MG tablet Take 10 mg by mouth  daily as needed for allergies.   Yes Historical Provider, MD  citalopram (CELEXA) 40 MG tablet Take 40 mg by mouth every morning.    Yes Historical Provider, MD  epoetin alfa (EPOGEN,PROCRIT) 91478 UNIT/ML injection Inject 20,000 Units into the skin every 28 (twenty-eight) days. Every four weeks   Yes Historical Provider, MD  Nutritional Supplements (FEEDING SUPPLEMENT, NEPRO CARB STEADY,) LIQD Take 237 mLs by mouth 3 (three) times daily between meals. 12/01/13  Yes Shanker Kristeen Mans, MD  pantoprazole (PROTONIX) 40 MG tablet Take 80 mg by mouth daily.  05/11/14  Yes Historical Provider, MD  sodium bicarbonate 650 MG tablet Take 1,300 mg by mouth daily.  05/11/14  Yes Historical Provider, MD    Social History:  reports that he quit smoking about 22 years ago. His smoking use included Cigarettes. He has a 16 pack-year smoking history. He quit smokeless tobacco use about 22 years ago. His smokeless tobacco use included Chew. He reports that he does not drink alcohol or use illicit drugs.  Family History  Problem Relation Age of Onset  . Diabetes Mellitus II Other   . Hypertension Other   . Bladder Cancer Neg Hx        Review of Systems:  Unable to obtain due to patient's altered mental status  Physical Exam: Blood pressure 97/49, pulse 73, resp. rate 15, SpO2 99 %. Constitutional:   Patient is a well-developed and well-nourished male* in no acute distress and cooperative with exam. Head: Normocephalic and atraumatic Mouth: Mucus membranes moist Eyes: PERRL, EOMI, conjunctivae normal Neck: Supple, No Thyromegaly Cardiovascular: RRR, S1 normal, S2 normal Pulmonary/Chest: CTAB, no wheezes, rales, or rhonchi Abdominal: Soft. Non-tender, non-distended, bowel sounds are normal, no masses, organomegaly, or guarding present.  Neurological: Somnolent but arousable, moving all extremities,  oriented to place and person Extremities : No Cyanosis, Clubbing or Edema  Labs on Admission:  Basic  Metabolic Panel:  Recent Labs Lab 10/19/14 1401  NA 137  K 3.5  CL 119*  CO2 9*  GLUCOSE 123*  BUN 71*  CREATININE 3.49*  CALCIUM 9.7   Liver Function Tests:  Recent Labs Lab 10/19/14 1401  AST 15  ALT 17  ALKPHOS 59  BILITOT 0.7  PROT 7.8  ALBUMIN 4.1   No results for input(s): LIPASE, AMYLASE in the last 168 hours. No results for input(s): AMMONIA in the last 168 hours. CBC:  Recent Labs Lab 10/19/14 1401  WBC 10.3  NEUTROABS 8.3*  HGB 10.6*  HCT 33.2*  MCV 88.5  PLT 269   Cardiac Enzymes: No results for input(s): CKTOTAL, CKMB, CKMBINDEX, TROPONINI in the last 168 hours.  BNP (last 3 results) No results for input(s): BNP in the last 8760 hours.  ProBNP (last 3 results)  Recent Labs  10/30/13 1919  PROBNP 96.4    CBG: No results for input(s): GLUCAP in the last 168 hours.  Radiological Exams on Admission: No results found.  EKG: Independently reviewed. Normal sinus rhythm, nonspecific ST-T changes   Assessment/Plan Active Problems:   Bladder cancer   AKI (acute  kidney injury)   CKD (chronic kidney disease), stage IV   Diabetes mellitus type 2, diet-controlled   Acute encephalopathy   Altered mental status  Altered mental status ? Cause, infectious versus metabolic versus brain metastasis. Will obtain UA, urine drug screen, chest x-ray, CT head. Patient will be monitored in stepdown unit. Check lactic acid level  Metabolic acidosis Patient has severe metabolic acidosis, though he usually runs CO2 13-14. He also has been taking sodium bicarbonate tablets at home. Called and discussed with nephrology Dr Arty Baumgartner , recommend starting patient on isotonic bicarbonate. They will see the patient in the hospitalis as consult  Acute on chronic kidney disease Patient has C KD stage IV, recently had vein AV fistula formation for anticipation of hemodialysis. Today BUN/creatinine is 71/3.49, his baseline BUN/creatinine runs around 60/2.74. We'll  start IV fluids and check BMP in a.m.  Diabetes mellitus Due to patient's altered mental status, will keep the patient nothing by mouth for now. Start sliding scale insulin with NovoLog.  History of bladder cancer Patient has history of bladder cancer status post neobladder, will obtain CT head to rule out brain metastasis  DVT prophylaxis Heparin  Code status: Full code  Family discussion: No family at bedside   Time Spent on Admission: 60 min  [facility] Pager: [phone] 10/19/2014, 4:28 PM  If 7PM-7AM, please contact night-coverage  [email or web address]  Password [email or web address]

## 2014-10-19 NOTE — ED Notes (Signed)
Bed: [id number] Expected date:  Expected time:  Means of arrival:  Comments: Hold for resus B

## 2014-10-19 NOTE — ED Notes (Signed)
His wife and daughter are here.  All restraints are released, as he is exhibiting safe behaviors now.

## 2014-10-19 NOTE — ED Provider Notes (Signed)
CSN: [id number]     Arrival date & time 10/19/14  1310 History   First MD Initiated Contact with Patient 10/19/14 1323     Chief Complaint  Patient presents with  . Medical Clearance     (Consider location/radiation/quality/duration/timing/severity/associated sxs/prior Treatment) HPI Comments: 54 year old male patient with history of bladder cancer status post neobladder, history of chronic kidney disease stage IV with AV fistula in place for anticipated eventual hemodialysis, diet-controlled diabetes, hypertension, anxiety/depression who is brought in to the ER for confusion/altered mental status. Wife reports that pt has been talking non stop since yday, and he has been talking about God and bible the whole time. 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 Pt is also not eating or drinking anything since yday. Pt is not cooperative   LEVEL 5 CAVEAT FOR ALTERED MENTAL STATUS.   The history is provided by the patient.    Past Medical History  Diagnosis Date  . Gout     recent flair -bilateral feet--  STABLE  . Frequency of urination   . Urgency incontinence   . Nocturia   . Diabetes mellitus type 2, diet-controlled PT STOPPED TAKING METFORMIN--  HAS BEEN WATCHING DIET, EXERCISING AND LOSING WT  . Bladder cancer 2013  . Complication of anesthesia     agitation w/awakening in 9/13,OK 01/24/12  . Hypertension   . Anxiety   . Depression   . GERD (gastroesophageal reflux disease)   . Acute renal injury 11/2013  . Anemia   . Kidney failure    Past Surgical History  Procedure Laterality Date  . [place] artery repair Right 2009    rt -trauma /thrombectomy,repair  . Transurethral resection of bladder tumor  12/15/2011    Procedure: TRANSURETHRAL RESECTION OF BLADDER TUMOR (TURBT);  Surgeon: Molli Hazard, MD;  Location: [facility];  Service: Urology;  Laterality: N/A;  2 HRS   . Lumbar laminectomy  06-02-2005    W/ RESECTION NERVE  ROOT  L4  -  L5  . Transurethral resection of bladder tumor  01/24/2012    Procedure: TRANSURETHRAL RESECTION OF BLADDER TUMOR (TURBT);  Surgeon: Molli Hazard, MD;  Location: [facility];  Service: Urology;  Laterality: N/A;  90 MIN   . Cystoscopy with urethral dilatation  01/24/2012    Procedure: CYSTOSCOPY WITH URETHRAL DILATATION;  Surgeon: Molli Hazard, MD;  Location: [facility];  Service: Urology;  Laterality: N/A;  . Transurethral resection of bladder tumor N/A 07/10/2012    Procedure: TRANSURETHRAL RESECTION OF BLADDER TUMOR (TURBT);  Surgeon: Molli Hazard, MD;  Location: [facility];  Service: Urology;  Laterality: N/A;  . Cystoscopy w/ retrogrades Bilateral 07/10/2012    Procedure: CYSTOSCOPY WITH RETROGRADE PYELOGRAM;  Surgeon: Molli Hazard, MD;  Location: [facility];  Service: Urology;  Laterality: Bilateral;  . Back surgery    . Bladder surgery  04/26/2013    at the [facility] [place] in  [place]    . Av fistula placement Left 05/31/2014    Procedure: LEFT RADIOCEPHALIC ARTERIOVENOUS (AV) FISTULA CREATION ;  Surgeon: Mal Misty, MD;  Location: [facility] OR;  Service: Vascular;  Laterality: Left;   Family History  Problem Relation Age of Onset  . Diabetes Mellitus II Other   . Hypertension Other   . Bladder Cancer Neg Hx    History  Substance Use Topics  . Smoking status: Former Smoker --  1.00 packs/day for 16 years    Types: Cigarettes    Quit date: 01/20/1992  . Smokeless tobacco: Former Systems developer    Types: [place] date: 01/20/1992  . Alcohol Use: No    Review of Systems  Unable to perform ROS: Mental status change      Allergies  Lisinopril  Home Medications   Prior to Admission medications   Medication Sig Start Date End Date Taking? Authorizing Provider  cetirizine (ZYRTEC) 10 MG tablet Take 10 mg by mouth daily as needed for allergies.   Yes  Historical Provider, MD  citalopram (CELEXA) 40 MG tablet Take 40 mg by mouth every morning.    Yes Historical Provider, MD  epoetin alfa (EPOGEN,PROCRIT) 43329 UNIT/ML injection Inject 20,000 Units into the skin every 28 (twenty-eight) days. Every four weeks   Yes Historical Provider, MD  Nutritional Supplements (FEEDING SUPPLEMENT, NEPRO CARB STEADY,) LIQD Take 237 mLs by mouth 3 (three) times daily between meals. 12/01/13  Yes Shanker Kristeen Mans, MD  pantoprazole (PROTONIX) 40 MG tablet Take 80 mg by mouth daily.  05/11/14  Yes Historical Provider, MD  sodium bicarbonate 650 MG tablet Take 1,300 mg by mouth daily.  05/11/14  Yes Historical Provider, MD   BP 97/49 mmHg  Pulse 73  Resp 15  SpO2 99% Physical Exam  Constitutional: He appears well-developed.  HENT:  Head: Atraumatic.  Eyes: Conjunctivae are normal.  Neck: Neck supple.  Cardiovascular: Normal rate.   Pulmonary/Chest: Effort normal.  Neurological: He is alert.  Skin: Skin is warm.  Psychiatric:  PRESSURE SPEECH, poor judgement  Nursing note and vitals reviewed.   ED Course  Procedures (including critical care time) Labs Review Labs Reviewed  CBC WITH DIFFERENTIAL/PLATELET - Abnormal; Notable for the following:    RBC 3.75 (*)    Hemoglobin 10.6 (*)    HCT 33.2 (*)    Neutrophils Relative % 81 (*)    Neutro Abs 8.3 (*)    All other components within normal limits  COMPREHENSIVE METABOLIC PANEL - Abnormal; Notable for the following:    Chloride 119 (*)    CO2 9 (*)    Glucose, Bld 123 (*)    BUN 71 (*)    Creatinine, Ser 3.49 (*)    GFR calc non Af Amer 18 (*)    GFR calc Af Amer 21 (*)    All other components within normal limits  BLOOD GAS, VENOUS - Abnormal; Notable for the following:    pH, Ven 7.119 (*)    pCO2, Ven 30.7 (*)    Bicarbonate 9.5 (*)    Acid-base deficit 19.1 (*)    All other components within normal limits  URINE RAPID DRUG SCREEN, HOSP PERFORMED  URINALYSIS, ROUTINE W REFLEX MICROSCOPIC  (NOT AT [facility])    Imaging Review No results found.   EKG Interpretation None      MDM   Final diagnoses:  Acidosis  Acute-on-chronic kidney injury  Altered mental status    Pt comes in with cc of confusion. He has not slept and has been speaking constantly about bible and God, which is not normal. Pt has hx of anxiety. No new meds. He has hx of CKD.  DDx includes: ICH Brain mets Sepsis syndrome Infection - UTI/Pneumonia Electrolyte abnormality Drug overdose Metabolic disorders including thyroid disorders, adrenal insufficiency Acute anemia Cancer of unknown origin Hypercapnia Psychoses  Labs indicated metabolic acidosis, with worsening in the bicarb, BUN and the Cr.  Will start  gentle hydration.  Pt is s/p Geodon, which was given due to his agitation. Pt has been IVCd by me.  Pt will need treatment and optimization of the metabolic acidosis - and if he doesn't return to baseline, will need psych to see him to ensure there is not psychoses or manic disorder. CT head pending.       Varney Biles, MD 10/19/14 1555

## 2014-10-20 DIAGNOSIS — E872 Acidosis: Secondary | ICD-10-CM

## 2014-10-20 LAB — RENAL FUNCTION PANEL
ALBUMIN: 3.4 g/dL — AB (ref 3.5–5.0)
Anion gap: 6 (ref 5–15)
BUN: 65 mg/dL — ABNORMAL HIGH (ref 6–20)
CALCIUM: 8.7 mg/dL — AB (ref 8.9–10.3)
CO2: 14 mmol/L — ABNORMAL LOW (ref 22–32)
CREATININE: 2.89 mg/dL — AB (ref 0.61–1.24)
Chloride: 120 mmol/L — ABNORMAL HIGH (ref 101–111)
GFR calc Af Amer: 26 mL/min — ABNORMAL LOW (ref >60)
GFR calc non Af Amer: 23 mL/min — ABNORMAL LOW (ref >60)
Glucose, Bld: 78 mg/dL (ref 65–99)
Phosphorus: 3.9 mg/dL (ref 2.5–4.6)
Potassium: 2.9 mmol/L — ABNORMAL LOW (ref 3.5–5.1)
Sodium: 140 mmol/L (ref 135–145)

## 2014-10-20 LAB — GLUCOSE, CAPILLARY
GLUCOSE-CAPILLARY: 107 mg/dL — AB (ref 65–99)
GLUCOSE-CAPILLARY: 70 mg/dL (ref 65–99)
GLUCOSE-CAPILLARY: 72 mg/dL (ref 65–99)
Glucose-Capillary: 102 mg/dL — ABNORMAL HIGH (ref 65–99)

## 2014-10-20 LAB — COMPREHENSIVE METABOLIC PANEL
ALT: 14 U/L — ABNORMAL LOW (ref 17–63)
ANION GAP: 6 (ref 5–15)
AST: 14 U/L — ABNORMAL LOW (ref 15–41)
Albumin: 3.4 g/dL — ABNORMAL LOW (ref 3.5–5.0)
Alkaline Phosphatase: 49 U/L (ref 38–126)
BILIRUBIN TOTAL: 0.4 mg/dL (ref 0.3–1.2)
BUN: 64 mg/dL — AB (ref 6–20)
CO2: 15 mmol/L — ABNORMAL LOW (ref 22–32)
Calcium: 8.9 mg/dL (ref 8.9–10.3)
Chloride: 119 mmol/L — ABNORMAL HIGH (ref 101–111)
Creatinine, Ser: 2.86 mg/dL — ABNORMAL HIGH (ref 0.61–1.24)
GFR calc non Af Amer: 23 mL/min — ABNORMAL LOW (ref >60)
GFR, EST AFRICAN AMERICAN: 27 mL/min — AB (ref >60)
Glucose, Bld: 82 mg/dL (ref 65–99)
POTASSIUM: 2.9 mmol/L — AB (ref 3.5–5.1)
Sodium: 140 mmol/L (ref 135–145)
TOTAL PROTEIN: 6.4 g/dL — AB (ref 6.5–8.1)

## 2014-10-20 LAB — SODIUM, URINE, RANDOM: Sodium, Ur: 66 mmol/L

## 2014-10-20 LAB — PHOSPHORUS: Phosphorus: 3.8 mg/dL (ref 2.5–4.6)

## 2014-10-20 LAB — CBC
HCT: 30.2 % — ABNORMAL LOW (ref 39.0–52.0)
HEMOGLOBIN: 9.8 g/dL — AB (ref 13.0–17.0)
MCH: 28.7 pg (ref 26.0–34.0)
MCHC: 32.5 g/dL (ref 30.0–36.0)
MCV: 88.6 fL (ref 78.0–100.0)
PLATELETS: 230 10*3/uL (ref 150–400)
RBC: 3.41 MIL/uL — ABNORMAL LOW (ref 4.22–5.81)
RDW: 15.5 % (ref 11.5–15.5)
WBC: 8.2 10*3/uL (ref 4.0–10.5)

## 2014-10-20 LAB — CREATININE, URINE, RANDOM: Creatinine, Urine: 36.13 mg/dL

## 2014-10-20 LAB — CK: Total CK: 71 U/L (ref 49–397)

## 2014-10-20 MED ORDER — INSULIN ASPART 100 UNIT/ML ~~LOC~~ SOLN
0.0000 [IU] | Freq: Three times a day (TID) | SUBCUTANEOUS | Status: DC
  Administered 2014-10-21: 1 [IU] via SUBCUTANEOUS
  Administered 2014-10-21: 2 [IU] via SUBCUTANEOUS

## 2014-10-20 MED ORDER — SODIUM BICARBONATE 8.4 % IV SOLN
INTRAVENOUS | Status: DC
  Administered 2014-10-20 (×2): via INTRAVENOUS
  Filled 2014-10-20 (×7): qty 150

## 2014-10-20 MED ORDER — CEFTRIAXONE SODIUM 1 G IJ SOLR
1.0000 g | INTRAMUSCULAR | Status: DC
  Administered 2014-10-20 – 2014-10-23 (×4): 1 g via INTRAVENOUS
  Filled 2014-10-20 (×4): qty 10

## 2014-10-20 MED ORDER — STERILE WATER FOR INJECTION IV SOLN
150.0000 meq | INTRAVENOUS | Status: DC
  Filled 2014-10-20: qty 850

## 2014-10-20 MED ORDER — POTASSIUM CHLORIDE 20 MEQ/15ML (10%) PO SOLN
40.0000 meq | Freq: Two times a day (BID) | ORAL | Status: AC
  Administered 2014-10-20 (×2): 40 meq via ORAL
  Filled 2014-10-20 (×2): qty 30

## 2014-10-20 NOTE — Progress Notes (Signed)
[facility]                                                                              Patient Demographics  [name] [name], is a 54 y.o. male, DOB - 06-25-1960, [id number]  Admit date - 10/19/2014   Admitting Physician [name] Hillock, MD  Outpatient Primary MD for the patient is [name] Ou, MD  LOS - 1   Chief Complaint  Patient presents with  . Medical Clearance      HPI on 10/19/2014 by Dr. Eleonore [name] 54 year old male who  has a past medical history of Gout; Frequency of urination; Urgency incontinence; Nocturia; Diabetes mellitus type 2, diet-controlled (PT STOPPED TAKING METFORMIN-- HAS BEEN WATCHING DIET, EXERCISING AND LOSING WT); Bladder cancer (2013); Complication of anesthesia; Hypertension; Anxiety; Depression; GERD (gastroesophageal reflux disease); Acute renal injury (11/2013); Anemia; and Kidney failure. Today was brought to the hospital for altered mental status. Patient has a history of bladder cancer status post neobladder, history of CAD stage IV with AV fistula in place in anticipation for eventual dialysis. Also has diet-controlled abdomen is mellitus. Patient is unable to provide significant history, very somnolent. As per the notes patient wife stated that patient was talking nonstop since yesterday and was talking what gotten Bible the whole time. 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 Patient has had poor by mouth intake yesterday. In the ED patient was found to have severe metabolic acidosis with CO2 9. He has been taking sodium bicarbonate tablets at home and his bicarbonate chronically has been around 13-14. ABG (venous sample) showed pH of 7.119, PCO2 30.7, PO2 37.7. Urine drug screen and UA are pending. He is afebrile. Review of systems unobtainable due to altered mental status.   Assessment & Plan   Acute encephalopathy -Infectious etiology vs metaboilc  -Appears to be improving -CT head: no evidence of acute  intracranial abnormality -CXR: no infection -CO2 improving -UA: TNTC WBC, Large leukocytes, many bacteria -Pending urine and blood cultures -Will start patient on ceftriaxone -Tox screen negative  Metabolic acidosis  -Improving -Likely secondary to renal function and neobladder/tubular acidosis  -Patient uses bicarb tablets at home -Currently on bicarb drip -Nephrology consulted and appreciate -renal US: Mild right sided hydronephrosis stable from prior exam  Questionable dysphagia vs aspiration -It seems that patient had coughing after drinking fluids overnight -Speech therapy consulted   Hypokalemia -Will continue to replete and continue to monitor BMP -Will obtain magnesium level  Acute on chronic kidney failure, Stage 4 -Creatinine improving, 2.89 -Continue IV fluids -Nephrology consulted and appreciated  Diabetes mellitus, type 2 with episodes of hypoglycemia -Appears to be diet controlled -Will add D5 to bicarb drip -Continue to monitor  History of bladder cancer -CT head made no mention of brain mets  Chronic normocytic anemia -Baseline Hb around 10, currently 9.8 -Continue to monitor CBC  Code Status: Full  Family Communication: None at bedside  Disposition Plan: Admitted  Time Spent in minutes   35 minutes  Procedures  Renal US  Consults   Nephrology   DVT Prophylaxis  Heparin   Lab Results  Component Value Date   PLT 230 10/20/2014  Medications  Scheduled Meds: . cefTRIAXone (ROCEPHIN)  IV  1 g Intravenous Q24H  . heparin  5,000 Units Subcutaneous 3 times per day  . insulin aspart  0-9 Units Subcutaneous TID WC  . potassium chloride  40 mEq Oral BID   Continuous Infusions: .  sodium bicarbonate  infusion 1000 mL 100 mL/hr at 10/20/14 1028   PRN Meds:.acetaminophen, ondansetron **OR** ondansetron (ZOFRAN) IV  Antibiotics   Anti-infectives    Start     Dose/Rate Route Frequency Ordered Stop   10/20/14 0730  cefTRIAXone  (ROCEPHIN) 1 g in dextrose 5 % 50 mL IVPB     1 g 100 mL/hr over 30 Minutes Intravenous Every 24 hours 10/20/14 [id number]        Subjective:   [name] [name] seen and examined today.  Patient appears to be at baseline.  He states he was tricked yesterday and was brought to the hospital yesterday instead of [employer]. He states his wife told him he "was talking out of his head and acting funny."   Currently, denies chest pain, shortness of breath, abdominal pain.  Per RN, patient had cough after drinking fluids over night.   Objective:   Filed Vitals:   10/20/14 1000 10/20/14 1100 10/20/14 1200 10/20/14 1300  BP: 117/63  121/64   Pulse: 73 68 59 64  Temp:   98.1 F (36.7 C)   TempSrc:   Oral   Resp: 17 16 10 16   Height:      Weight:      SpO2: 97% 100% 99% 99%    Wt Readings from Last 3 Encounters:  10/20/14 77.5 kg (170 lb 13.7 oz)  07/16/14 81.647 kg (180 lb)  07/06/14 89.313 kg (196 lb 14.4 oz)     Intake/Output Summary (Last 24 hours) at 10/20/14 1316 Last data filed at 10/20/14 1200  Gross per 24 hour  Intake 3133.33 ml  Output   2650 ml  Net 483.33 ml    Exam  General: Well developed, well nourished, NAD, appears stated age  HEENT: NCAT, PERRLA, EOMI, Anicteic Sclera, mucous membranes moist.   Cardiovascular: S1 S2 auscultated, no rubs, murmurs or gallops. Regular rate and rhythm.  Respiratory: Clear to auscultation bilaterally with equal chest rise  Abdomen: Soft, nontender, nondistended, + bowel sounds  Extremities: warm dry without cyanosis clubbing or edema. LAVF with good thrill/bruit  Neuro: AAOx3, cranial nerves grossly intact. Strength 5/5 in patient's upper and lower extremities bilaterally  Skin: Without rashes exudates or nodules  Psych: Normal affect and demeanor with intact judgement and insight  Data Review   Micro Results Recent Results (from the past 240 hour(s))  MRSA PCR Screening     Status: None   Collection Time: 10/19/14  8:30 PM   Result Value Ref Range Status   MRSA by PCR NEGATIVE NEGATIVE Final    Comment:        The GeneXpert MRSA Assay (FDA approved for NASAL specimens only), is one component of a comprehensive MRSA colonization surveillance program. It is not intended to diagnose MRSA infection nor to guide or monitor treatment for MRSA infections.     Radiology Reports Dg Chest 2 View  10/19/2014   CLINICAL DATA:  54 year old male with delirium  EXAM: CHEST  2 VIEW  COMPARISON:  Chest radiograph dated 10/30/2013  FINDINGS: The heart size and mediastinal contours are within normal limits. Both lungs are clear. The visualized skeletal structures are unremarkable.  IMPRESSION: No active cardiopulmonary disease.   Electronically  Signed   By: Anner Crete M.D.   On: 10/19/2014 17:09   Ct Head Wo Contrast  10/19/2014   CLINICAL DATA:  Altered mental status  EXAM: CT HEAD WITHOUT CONTRAST  TECHNIQUE: Contiguous axial images were obtained from the base of the skull through the vertex without intravenous contrast.  COMPARISON:  07/05/2014  FINDINGS: No evidence of parenchymal hemorrhage or extra-axial fluid collection. No mass lesion, mass effect, or midline shift.  No CT evidence of acute infarction.  Cerebral volume is within normal limits.  No ventriculomegaly.  The visualized paranasal sinuses are essentially clear. The mastoid air cells are unopacified.  No evidence of calvarial fracture.  IMPRESSION: No evidence of acute intracranial abnormality.   Electronically Signed   By: Julian Hy M.D.   On: 10/19/2014 17:05   US Renal  10/19/2014   CLINICAL DATA:  Acute renal failure  EXAM: RENAL / URINARY TRACT ULTRASOUND COMPLETE  COMPARISON:  01/11/2014  FINDINGS: Right Kidney:  Length: 11.4 cm. Mild hydronephrosis is again seen and stable from the prior exam. This may be the patient's current steady state  Left Kidney:  Length: 11.6 cm. Echogenicity within normal limits. No mass or hydronephrosis visualized.   Bladder:  Neobladder is noted.  IMPRESSION: Mild right-sided hydronephrosis stable from the prior exam. No other focal abnormality is seen.   Electronically Signed   By: Inez Catalina M.D.   On: 10/19/2014 18:10    CBC  Recent Labs Lab 10/19/14 1401 10/20/14 0408  WBC 10.3 8.2  HGB 10.6* 9.8*  HCT 33.2* 30.2*  PLT 269 230  MCV 88.5 88.6  MCH 28.3 28.7  MCHC 31.9 32.5  RDW 15.4 15.5  LYMPHSABS 1.4  --   MONOABS 0.5  --   EOSABS 0.1  --   BASOSABS 0.0  --     Chemistries   Recent Labs Lab 10/19/14 1401 10/20/14 0408  NA 137 140  140  K 3.5 2.9*  2.9*  CL 119* 120*  119*  CO2 9* 14*  15*  GLUCOSE 123* 78  82  BUN 71* 65*  64*  CREATININE 3.49* 2.89*  2.86*  CALCIUM 9.7 8.7*  8.9  AST 15 14*  ALT 17 14*  ALKPHOS 59 49  BILITOT 0.7 0.4   ------------------------------------------------------------------------------------------------------------------ estimated creatinine clearance is 30.7 mL/min (by C-G formula based on Cr of 2.86). ------------------------------------------------------------------------------------------------------------------ No results for input(s): HGBA1C in the last 72 hours. ------------------------------------------------------------------------------------------------------------------ No results for input(s): CHOL, HDL, LDLCALC, TRIG, CHOLHDL, LDLDIRECT in the last 72 hours. ------------------------------------------------------------------------------------------------------------------ No results for input(s): TSH, T4TOTAL, T3FREE, THYROIDAB in the last 72 hours.  Invalid input(s): FREET3 ------------------------------------------------------------------------------------------------------------------ No results for input(s): VITAMINB12, FOLATE, FERRITIN, TIBC, IRON, RETICCTPCT in the last 72 hours.  Coagulation profile No results for input(s): INR, PROTIME in the last 168 hours.  No results for input(s): DDIMER in the last 72  hours.  Cardiac Enzymes No results for input(s): CKMB, TROPONINI, MYOGLOBIN in the last 168 hours.  Invalid input(s): CK ------------------------------------------------------------------------------------------------------------------ Invalid input(s): POCBNP    Concetta Guion D.O. on 10/20/2014 at 1:16 PM  Between 7am to 7pm - Pager - [phone]  After 7pm go to [email or web address] - password [email or web address]  And look for the night coverage person covering for me after hours  [facility] Office  [phone]

## 2014-10-20 NOTE — Progress Notes (Signed)
Patient ID: [name] [name], male   DOB: October 11, 1960, 54 y.o.   MRN: [id number] S:More awake and alert today but upset about "being lied to all day yesterday" O:BP 137/64 mmHg  Pulse 69  Temp(Src) 98.1 F (36.7 C) (Oral)  Resp 15  Ht 5\' 11"  (1.803 m)  Wt 77.5 kg (170 lb 13.7 oz)  BMI 23.84 kg/m2  SpO2 100%  Intake/Output Summary (Last 24 hours) at 10/20/14 1500 Last data filed at 10/20/14 1400  Gross per 24 hour  Intake 4033.33 ml  Output   2250 ml  Net 1783.33 ml   Intake/Output: I/O last 3 completed shifts: In: 2690 [P.O.:240; I.V.:1450; Other:500; IV Piggyback:500] Out: 2150 [Urine:2150]  Intake/Output this shift:  Total I/O In: 1640.3 [P.O.:940; I.V.:650.3; IV Piggyback:50] Out: 500 [Urine:500] Weight change:  Gen:WD WN WM in NAD CVS:no rub Resp:cta [id number] Ext:no edema, L forearm AVF +T/B   Recent Labs Lab 10/19/14 1401 10/20/14 0408  NA 137 140  140  K 3.5 2.9*  2.9*  CL 119* 120*  119*  CO2 9* 14*  15*  GLUCOSE 123* 78  82  BUN 71* 65*  64*  CREATININE 3.49* 2.89*  2.86*  ALBUMIN 4.1 3.4*  3.4*  CALCIUM 9.7 8.7*  8.9  PHOS  --  3.9  3.8  AST 15 14*  ALT 17 14*   Liver Function Tests:  Recent Labs Lab 10/19/14 1401 10/20/14 0408  AST 15 14*  ALT 17 14*  ALKPHOS 59 49  BILITOT 0.7 0.4  PROT 7.8 6.4*  ALBUMIN 4.1 3.4*  3.4*   No results for input(s): LIPASE, AMYLASE in the last 168 hours. No results for input(s): AMMONIA in the last 168 hours. CBC:  Recent Labs Lab 10/19/14 1401 10/20/14 0408  WBC 10.3 8.2  NEUTROABS 8.3*  --   HGB 10.6* 9.8*  HCT 33.2* 30.2*  MCV 88.5 88.6  PLT 269 230   Cardiac Enzymes:  Recent Labs Lab 10/19/14 1401 10/20/14 0408  CKTOTAL 76 71   CBG:  Recent Labs Lab 10/19/14 2017 10/20/14 0024 10/20/14 0755 10/20/14 1231  GLUCAP 70 70 72 107*    Iron Studies: No results for input(s): IRON, TIBC, TRANSFERRIN, FERRITIN in the last 72 hours. Studies/Results: Dg Chest 2  View  10/19/2014   CLINICAL DATA:  54 year old male with delirium  EXAM: CHEST  2 VIEW  COMPARISON:  Chest radiograph dated 10/30/2013  FINDINGS: The heart size and mediastinal contours are within normal limits. Both lungs are clear. The visualized skeletal structures are unremarkable.  IMPRESSION: No active cardiopulmonary disease.   Electronically Signed   By: Anner Crete M.D.   On: 10/19/2014 17:09   Ct Head Wo Contrast  10/19/2014   CLINICAL DATA:  Altered mental status  EXAM: CT HEAD WITHOUT CONTRAST  TECHNIQUE: Contiguous axial images were obtained from the base of the skull through the vertex without intravenous contrast.  COMPARISON:  07/05/2014  FINDINGS: No evidence of parenchymal hemorrhage or extra-axial fluid collection. No mass lesion, mass effect, or midline shift.  No CT evidence of acute infarction.  Cerebral volume is within normal limits.  No ventriculomegaly.  The visualized paranasal sinuses are essentially clear. The mastoid air cells are unopacified.  No evidence of calvarial fracture.  IMPRESSION: No evidence of acute intracranial abnormality.   Electronically Signed   By: Julian Hy M.D.   On: 10/19/2014 17:05   US Renal  10/19/2014   CLINICAL DATA:  Acute renal failure  EXAM: RENAL /  URINARY TRACT ULTRASOUND COMPLETE  COMPARISON:  01/11/2014  FINDINGS: Right Kidney:  Length: 11.4 cm. Mild hydronephrosis is again seen and stable from the prior exam. This may be the patient's current steady state  Left Kidney:  Length: 11.6 cm. Echogenicity within normal limits. No mass or hydronephrosis visualized.  Bladder:  Neobladder is noted.  IMPRESSION: Mild right-sided hydronephrosis stable from the prior exam. No other focal abnormality is seen.   Electronically Signed   By: Inez Catalina M.D.   On: 10/19/2014 18:10   . cefTRIAXone (ROCEPHIN)  IV  1 g Intravenous Q24H  . heparin  5,000 Units Subcutaneous 3 times per day  . insulin aspart  0-9 Units Subcutaneous TID WC  .  potassium chloride  40 mEq Oral BID    BMET    Component Value Date/Time   NA 140 10/20/2014 0408   NA 140 10/20/2014 0408   K 2.9* 10/20/2014 0408   K 2.9* 10/20/2014 0408   CL 119* 10/20/2014 0408   CL 120* 10/20/2014 0408   CO2 15* 10/20/2014 0408   CO2 14* 10/20/2014 0408   GLUCOSE 82 10/20/2014 0408   GLUCOSE 78 10/20/2014 0408   BUN 64* 10/20/2014 0408   BUN 65* 10/20/2014 0408   CREATININE 2.86* 10/20/2014 0408   CREATININE 2.89* 10/20/2014 0408   CREATININE 2.53* 08/29/2013 1947   CALCIUM 8.9 10/20/2014 0408   CALCIUM 8.7* 10/20/2014 0408   CALCIUM 9.5 09/24/2014 0900   GFRNONAA 23* 10/20/2014 0408   GFRNONAA 23* 10/20/2014 0408   GFRAA 27* 10/20/2014 0408   GFRAA 26* 10/20/2014 0408   CBC    Component Value Date/Time   WBC 8.2 10/20/2014 0408   WBC 9.8 08/29/2013 1947   RBC 3.41* 10/20/2014 0408   RBC 3.92* 10/30/2013 1931   RBC 3.47* 08/29/2013 1947   HGB 9.8* 10/20/2014 0408   HGB 9.0* 08/29/2013 1947   HCT 30.2* 10/20/2014 0408   HCT 28.9* 08/29/2013 1947   PLT 230 10/20/2014 0408   MCV 88.6 10/20/2014 0408   MCV 83.3 08/29/2013 1947   MCH 28.7 10/20/2014 0408   MCH 25.9* 08/29/2013 1947   MCHC 32.5 10/20/2014 0408   MCHC 31.1* 08/29/2013 1947   RDW 15.5 10/20/2014 0408   LYMPHSABS 1.4 10/19/2014 1401   MONOABS 0.5 10/19/2014 1401   EOSABS 0.1 10/19/2014 1401   BASOSABS 0.0 10/19/2014 1401     Assessment/Plan: 1. AKI/CKD- in setting of hypotension and AMS. Worrisome for recurrence of possible urosepsis vs. Volume depletion. Has h/o similar episode in August 2015 which responded to IVF's and antibiotics. Renal US without change in mild hydronephrosis on right.  1. Improving with IVF's  2. Continue to follow UOP and Scr 3. No indication for dialysis at this time. Cont to follow  2. Metabolic acidosis- pt with chronic metabolic acidosis related to CKD and neobladder/tubular acidosis. On chronic replacement but will require IV isotonic  bicarb.  1. Improving with IV isotonic bicarb.  Cont to follow 3. AMS- CT scan of head negative. Possibly related to metabolic encephalopathy, doubt uremia given Scr not much off baseline.  1. UA with + leukocytes and nitrite negative.  Has h/o AMS with UTI in the past.  Culture pending. 2. No history of ingestion of Etoh or other intoxicants. tox screen pending.  3. Neuro exam nonfocal. 4. Vascular access- L AVF +T/B mature and ready for use when needed. 5. Hypokalemia- replete and follow. 6. DM- not on metformin. Management per primary svc.  [place] A

## 2014-10-20 NOTE — Progress Notes (Signed)
ANTIBIOTIC CONSULT NOTE - INITIAL  Pharmacy Consult for Ceftriaxone Indication: UTI  Allergies  Allergen Reactions  . Lisinopril Cough    Patient Measurements:     Vital Signs: Temp: 97.4 F (36.3 C) (07/17 0447) Temp Source: Axillary (07/17 0447) BP: 104/49 mmHg (07/17 0200) Pulse Rate: 65 (07/17 0200) Intake/Output from previous day: 07/16 0701 - 07/17 0700 In: [place] [P.O.:120; I.V.:650; IV Piggyback:500] Out: 1850 [Urine:1850] Intake/Output from this shift:    Labs:  Recent Labs  10/19/14 1401 10/19/14 2045 10/20/14 0408  WBC 10.3  --  8.2  HGB 10.6*  --  9.8*  PLT 269  --  230  LABCREA  --  36.13  --   CREATININE 3.49*  --  2.86*   CrCl cannot be calculated (Unknown ideal weight.). No results for input(s): VANCOTROUGH, VANCOPEAK, VANCORANDOM, GENTTROUGH, GENTPEAK, GENTRANDOM, TOBRATROUGH, TOBRAPEAK, TOBRARND, AMIKACINPEAK, AMIKACINTROU, AMIKACIN in the last 72 hours.   Microbiology: Recent Results (from the past 720 hour(s))  MRSA PCR Screening     Status: None   Collection Time: 10/19/14  8:30 PM  Result Value Ref Range Status   MRSA by PCR NEGATIVE NEGATIVE Final    Comment:        The GeneXpert MRSA Assay (FDA approved for NASAL specimens only), is one component of a comprehensive MRSA colonization surveillance program. It is not intended to diagnose MRSA infection nor to guide or monitor treatment for MRSA infections.     Medical History: Past Medical History  Diagnosis Date  . Gout     recent flair -bilateral feet--  STABLE  . Frequency of urination   . Urgency incontinence   . Nocturia   . Diabetes mellitus type 2, diet-controlled PT STOPPED TAKING METFORMIN--  HAS BEEN WATCHING DIET, EXERCISING AND LOSING WT  . Bladder cancer 2013  . Complication of anesthesia     agitation w/awakening in 9/13,OK 01/24/12  . Hypertension   . Anxiety   . Depression   . GERD (gastroesophageal reflux disease)   . Acute renal injury 11/2013  . Anemia    . Kidney failure     Medications:  Scheduled:  . cefTRIAXone (ROCEPHIN)  IV  1 g Intravenous Q24H  . heparin  5,000 Units Subcutaneous 3 times per day  . potassium chloride  40 mEq Oral BID   Infusions:  .  sodium bicarbonate 150 mEq in sterile water 1000 mL infusion 150 mEq (10/20/14 0352)   Assessment:  54 yr male admitted on 7/16 for altered mental status  Pharmacy consulted to dose Ceftriaxone for UTI  Scr = 2.86 (no adjustment for renal dysfunction necessary)  Urine culture ordered  Goal of Therapy:  Eradication of infection  Plan:  Measure antibiotic drug levels at steady state Follow up culture results  Ceftriaxone 1gm IV q24h  Everette Rank, PharmD 10/20/2014,7:02 AM

## 2014-10-20 NOTE — Progress Notes (Signed)
Sitter in pt's room with pt's wife.

## 2014-10-21 DIAGNOSIS — F333 Major depressive disorder, recurrent, severe with psychotic symptoms: Secondary | ICD-10-CM

## 2014-10-21 DIAGNOSIS — N184 Chronic kidney disease, stage 4 (severe): Secondary | ICD-10-CM

## 2014-10-21 DIAGNOSIS — G9340 Encephalopathy, unspecified: Secondary | ICD-10-CM

## 2014-10-21 DIAGNOSIS — R404 Transient alteration of awareness: Secondary | ICD-10-CM

## 2014-10-21 DIAGNOSIS — D638 Anemia in other chronic diseases classified elsewhere: Secondary | ICD-10-CM

## 2014-10-21 DIAGNOSIS — E119 Type 2 diabetes mellitus without complications: Secondary | ICD-10-CM

## 2014-10-21 LAB — GLUCOSE, CAPILLARY
GLUCOSE-CAPILLARY: 106 mg/dL — AB (ref 65–99)
Glucose-Capillary: 95 mg/dL (ref 65–99)
Glucose-Capillary: 122 mg/dL — ABNORMAL HIGH (ref 65–99)
Glucose-Capillary: 156 mg/dL — ABNORMAL HIGH (ref 65–99)

## 2014-10-21 LAB — CBC
HEMATOCRIT: 29.8 % — AB (ref 39.0–52.0)
Hemoglobin: 10 g/dL — ABNORMAL LOW (ref 13.0–17.0)
MCH: 28.9 pg (ref 26.0–34.0)
MCHC: 33.6 g/dL (ref 30.0–36.0)
MCV: 86.1 fL (ref 78.0–100.0)
PLATELETS: 253 10*3/uL (ref 150–400)
RBC: 3.46 MIL/uL — AB (ref 4.22–5.81)
RDW: 14.9 % (ref 11.5–15.5)
WBC: 8.5 10*3/uL (ref 4.0–10.5)

## 2014-10-21 LAB — BASIC METABOLIC PANEL
ANION GAP: 8 (ref 5–15)
BUN: 49 mg/dL — ABNORMAL HIGH (ref 6–20)
CHLORIDE: 109 mmol/L (ref 101–111)
CO2: 21 mmol/L — ABNORMAL LOW (ref 22–32)
CREATININE: 2.4 mg/dL — AB (ref 0.61–1.24)
Calcium: 8.4 mg/dL — ABNORMAL LOW (ref 8.9–10.3)
GFR calc Af Amer: 33 mL/min — ABNORMAL LOW (ref >60)
GFR calc non Af Amer: 29 mL/min — ABNORMAL LOW (ref >60)
GLUCOSE: 103 mg/dL — AB (ref 65–99)
Potassium: 2.4 mmol/L — CL (ref 3.5–5.1)
SODIUM: 138 mmol/L (ref 135–145)

## 2014-10-21 LAB — HEMOGLOBIN A1C
Hgb A1c MFr Bld: 5.5 % (ref 4.8–5.6)
Mean Plasma Glucose: 111 mg/dL

## 2014-10-21 LAB — MAGNESIUM: Magnesium: 2 mg/dL (ref 1.7–2.4)

## 2014-10-21 MED ORDER — LORAZEPAM 2 MG/ML IJ SOLN
1.0000 mg | Freq: Three times a day (TID) | INTRAMUSCULAR | Status: DC | PRN
  Administered 2014-10-21: 1 mg via INTRAVENOUS
  Filled 2014-10-21: qty 1

## 2014-10-21 MED ORDER — POTASSIUM CHLORIDE 10 MEQ/100ML IV SOLN
10.0000 meq | INTRAVENOUS | Status: AC
  Administered 2014-10-21 (×6): 10 meq via INTRAVENOUS
  Filled 2014-10-21 (×9): qty 100

## 2014-10-21 MED ORDER — CITALOPRAM HYDROBROMIDE 40 MG PO TABS
40.0000 mg | ORAL_TABLET | Freq: Every day | ORAL | Status: DC
  Administered 2014-10-21 – 2014-10-23 (×3): 40 mg via ORAL
  Filled 2014-10-21 (×3): qty 1

## 2014-10-21 MED ORDER — QUETIAPINE FUMARATE 25 MG PO TABS
25.0000 mg | ORAL_TABLET | Freq: Every day | ORAL | Status: DC
  Administered 2014-10-21 – 2014-10-22 (×2): 25 mg via ORAL
  Filled 2014-10-21 (×3): qty 1

## 2014-10-21 MED ORDER — SODIUM BICARBONATE 650 MG PO TABS
1300.0000 mg | ORAL_TABLET | Freq: Three times a day (TID) | ORAL | Status: DC
  Administered 2014-10-21 – 2014-10-23 (×6): 1300 mg via ORAL
  Filled 2014-10-21 (×8): qty 2

## 2014-10-21 NOTE — Progress Notes (Addendum)
Patient ID: [name] [name], male   DOB: [birth year], 54 y.o.   MRN: [id number] [facility] PROGRESS NOTE  RICAHRD NICOLOFF [id number] DOB: [birth year] DOA: 10/19/2014 PCP: Florina Ou, MD  Brief narrative:    54 year old male with past medical history of bladder cancer, anxiety and depression, chronic kidney disease stage 4 with AV fistula in place in anticipation for eventual HD, diabetes (diet controlled) who presented to [facility] hospital with altered mental status and poor by mouth intake as reported by patient's wife at the time of the admission. On the admission, patient was hemodynamically stable. He was found to have severe metabolic acidosis with a CO2 of 9. His urine drug screen was within normal limits. His urinalysis revealed large leukocytes and many bacteria for which reason he was started on empiric Rocephin for urinary tract infection.  Barrier to discharge: Patient still with somewhat of a confusion, his speech is very repetitive. I have requested psych consultation for further evaluation.    Assessment/Plan:    Principal problem: Acute encephalopathy - Unclear etiology however possibility includes urinary tract infection as well as depression - I have requested psychiatry evaluation - CT head with no acute intracranial findings - He is being treated with Rocephin for urinary tract infection. - Continue to monitor her mental status - UDS WNL  Active problems: Chronic kidney disease, stage IV / acute on chronic kidney disease / metabolic acidosis - Nephrology is following, appreciate their input - Creatinine is improving in past 48 hours - Patient has left AV fistula, per nephrology ready to use when needed - Continue sodium bicarbonate infusion  Anemia of chronic disease - Secondary to chronic kidney disease - Hemoglobin is stable at 10 - No current indications for transfusion  Acute urinary tract infection - Large leukocytes and many bacteria seen on  admission on urinalysis - Continue Rocephin - Follow up urine culture results  Hypokalemia - Likely from CKD, metabolic acidosis - Supplemented  Questionable dysphagia vs aspiration - Had coughing after drinking fluids  - Speech therapy consulted   Diabetes mellitus, type 2 with episodes of hypoglycemia - Controlled   History of bladder cancer - Stable    DVT Prophylaxis  - Heparin subcutaneous ordered   Code Status: Full.  Family Communication:  plan of care discussed with the patient Disposition Plan: Home once renal function improves and potassium level more stable   IV access:  Peripheral IV  Procedures and diagnostic studies:    Dg Chest 2 View 10/19/2014   No active cardiopulmonary disease.     Ct Head Wo Contrast 10/19/2014  No evidence of acute intracranial abnormality.     US Renal 10/19/2014   Mild right-sided hydronephrosis stable from the prior exam. No other focal abnormality is seen.     Medical Consultants:  Nephrology Psychiatry    Other Consultants:  None   IAnti-Infectives:   Rocephin 10/20/2014 -->    Leisa Lenz, MD  [facility] Pager [phone]  Time spent in minutes: 25 minutes  If 7PM-7AM, please contact night-coverage [email or web address] Password [facility] 10/21/2014, 10:34 AM   LOS: 2 days    HPI/Subjective: No acute overnight events. Patient reports feeling better this am.   Objective: Filed Vitals:   10/20/14 1450 10/20/14 1622 10/20/14 2116 10/21/14 0425  BP:  138/71 127/67 128/73  Pulse: 69 78 70 73  Temp:  98.4 F (36.9 C) 97.9 F (36.6 C) 97.9 F (36.6 C)  TempSrc:  Oral Oral Oral  Resp: 16 18  16 18  Height:  5\' 10"  (1.778 m)    Weight:  79.107 kg (174 lb 6.4 oz)    SpO2: 100% 100% 100% 100%    Intake/Output Summary (Last 24 hours) at 10/21/14 1034 Last data filed at 10/21/14 0700  Gross per 24 hour  Intake 3270.33 ml  Output   4500 ml  Net -1229.67 ml    Exam:   General:  Pt is alert, not in acute  distress  Cardiovascular: Regular rate and rhythm, S1/S2, no murmurs  Respiratory: Clear to auscultation bilaterally, no wheezing, no crackles, no rhonchi  Abdomen: Soft, non tender, non distended, bowel sounds present  Extremities: No edema, pulses DP and PT palpable bilaterally  Neuro: Grossly nonfocal  Data Reviewed: Basic Metabolic Panel:  Recent Labs Lab 10/19/14 1401 10/20/14 0408 10/21/14 0423  NA 137 140  140 138  K 3.5 2.9*  2.9* 2.4*  CL 119* 120*  119* 109  CO2 9* 14*  15* 21*  GLUCOSE 123* 78  82 103*  BUN 71* 65*  64* 49*  CREATININE 3.49* 2.89*  2.86* 2.40*  CALCIUM 9.7 8.7*  8.9 8.4*  MG  --   --  2.0  PHOS  --  3.9  3.8  --    Liver Function Tests:  Recent Labs Lab 10/19/14 1401 10/20/14 0408  AST 15 14*  ALT 17 14*  ALKPHOS 59 49  BILITOT 0.7 0.4  PROT 7.8 6.4*  ALBUMIN 4.1 3.4*  3.4*   No results for input(s): LIPASE, AMYLASE in the last 168 hours. No results for input(s): AMMONIA in the last 168 hours. CBC:  Recent Labs Lab 10/19/14 1401 10/20/14 0408 10/21/14 0423  WBC 10.3 8.2 8.5  NEUTROABS 8.3*  --   --   HGB 10.6* 9.8* 10.0*  HCT 33.2* 30.2* 29.8*  MCV 88.5 88.6 86.1  PLT 269 230 253   Cardiac Enzymes:  Recent Labs Lab 10/19/14 1401 10/20/14 0408  CKTOTAL 76 71   BNP: Invalid input(s): POCBNP CBG:  Recent Labs Lab 10/19/14 2017 10/20/14 0024 10/20/14 0755 10/20/14 1231 10/20/14 1809  GLUCAP 70 70 72 107* 102*    Recent Results (from the past 240 hour(s))  MRSA PCR Screening     Status: None   Collection Time: 10/19/14  8:30 PM  Result Value Ref Range Status   MRSA by PCR NEGATIVE NEGATIVE Final     Scheduled Meds: . cefTRIAXone (ROCEPHIN)  IV  1 g Intravenous Q24H  . heparin  5,000 Units Subcutaneous 3 times per day  . insulin aspart  0-9 Units Subcutaneous TID WC  . potassium chloride  10 mEq Intravenous Q1 Hr x 6   Continuous Infusions: .  sodium bicarbonate  infusion 1000 mL 100 mL/hr  at 10/20/14 2017

## 2014-10-21 NOTE — Consult Note (Signed)
[facility] Consult   Reason for Consult:  AMS, depression and hallucinations  Referring Physician:  Dr. Charlies Silvers Patient Identification: [name] [name] MRN:  [id number] Principal Diagnosis: Altered mental status Diagnosis:   Patient Active Problem List   Diagnosis Date Noted  . Acute encephalopathy [G93.40] 10/19/2014  . Altered mental status [R41.82] 10/19/2014  . Faintness [R55]   . Syncope [R55] 07/06/2014  . Syncope and collapse [R55] 07/05/2014  . CKD (chronic kidney disease), stage IV [N18.4] 07/05/2014  . Acute coccygeal pain [M53.3] 07/05/2014  . Occipital scalp laceration [S01.00XA] 07/05/2014  . Diabetes mellitus type 2, diet-controlled [E11.9] 07/05/2014  . Protein calorie malnutrition [E46] 11/29/2013  . AKI (acute kidney injury) [N17.9] 10/30/2013  . Hypercalcemia [E83.52] 06/26/2013  . UTI (lower urinary tract infection) [N39.0] 06/25/2013  . Bladder cancer [C67.9] 05/09/2013    Total Time spent with patient: 1 hour  Subjective:   [name] [name] is a 54 y.o. male patient admitted with AMS and confusion.  HPI:   [name] [name] is a 54 years old male seen, chart reviewed and case discussed with psychiatric inpatient social service for psychiatric consultation and evaluation of altered mental status with history of depression and hallucinations/bizarre behaviors. Patient reported suffering with depression over 9 years and history of acute inpatient psychiatric hospitalization for overdose twice again 8 years. Patient has been receiving antidepression medication from primary care physician. Patient presented to the hospital with altered mental status, his wife cannot understand his inappropriate and incomprehensible language and also reportedly suffering with bladder cancer and had neo-bladder and chronic kidney disease over 2 . Patient reportedly not able to eat or drink recently. Patient has mild confusion during my evaluation but does not appear to be  delirious or demented. Patient has orientation to time place person and situation, concentration and memory and delayed memory is 2 out of 3. Patient has intact language functions. Patient is willing to take his medication as prescribed. Patient has no acute suicidal/homicidal ideation, intention or plans.   HPI Elements:   Location:  Depression and hallucinations. Quality:  Fair to poor. Severity:  Chronic to acute. Timing:  Unknown. Duration:  Few days. Context:  Psychosocial and complicated medical problems.  Past Medical History:  Past Medical History  Diagnosis Date  . Gout     recent flair -bilateral feet--  STABLE  . Frequency of urination   . Urgency incontinence   . Nocturia   . Diabetes mellitus type 2, diet-controlled PT STOPPED TAKING METFORMIN--  HAS BEEN WATCHING DIET, EXERCISING AND LOSING WT  . Bladder cancer 2013  . Complication of anesthesia     agitation w/awakening in 9/13,OK 01/24/12  . Hypertension   . Anxiety   . Depression   . GERD (gastroesophageal reflux disease)   . Acute renal injury 11/2013  . Anemia   . Kidney failure     Past Surgical History  Procedure Laterality Date  . [place] artery repair Right 2009    rt -trauma /thrombectomy,repair  . Transurethral resection of bladder tumor  12/15/2011    Procedure: TRANSURETHRAL RESECTION OF BLADDER TUMOR (TURBT);  Surgeon: Molli Hazard, MD;  Location[facility];  Service: Urology;  Laterality: N/A;  2 HRS   . Lumbar laminectomy  06-02-2005    W/ RESECTION NERVE ROOT  L4  -  L5  . Transurethral resection of bladder tumor  01/24/2012    Procedure: TRANSURETHRAL RESECTION OF BLADDER TUMOR (TURBT);  Surgeon: Molli Hazard, MD;  Location: [facility];  Service: Urology;  Laterality: N/A;  90 MIN   . Cystoscopy with urethral dilatation  01/24/2012    Procedure: CYSTOSCOPY WITH URETHRAL DILATATION;  Surgeon: Molli Hazard, MD;  Location: [facility];  Service: Urology;  Laterality: N/A;  . Transurethral resection of bladder tumor N/A 07/10/2012    Procedure: TRANSURETHRAL RESECTION OF BLADDER TUMOR (TURBT);  Surgeon: Molli Hazard, MD;  Location: [facility];  Service: Urology;  Laterality: N/A;  . Cystoscopy w/ retrogrades Bilateral 07/10/2012    Procedure: CYSTOSCOPY WITH RETROGRADE PYELOGRAM;  Surgeon: Molli Hazard, MD;  Location: [facility];  Service: Urology;  Laterality: Bilateral;  . Back surgery    . Bladder surgery  04/26/2013    at the [facility] [place] in  [place]    . Av fistula placement Left 05/31/2014    Procedure: LEFT RADIOCEPHALIC ARTERIOVENOUS (AV) FISTULA CREATION ;  Surgeon: Mal Misty, MD;  Location: [facility] OR;  Service: Vascular;  Laterality: Left;   Family History:  Family History  Problem Relation Age of Onset  . Diabetes Mellitus II Other   . Hypertension Other   . Bladder Cancer Neg Hx    Social History:  History  Alcohol Use No     History  Drug Use No    History   Social History  . Marital Status: Married    Spouse Name: N/A  . Number of Children: N/A  . Years of Education: N/A   Social History Main Topics  . Smoking status: Former Smoker -- 1.00 packs/day for 16 years    Types: Cigarettes    Quit date: 01/20/1992  . Smokeless tobacco: Former Systems developer    Types: [place] date: 01/20/1992  . Alcohol Use: No  . Drug Use: No  . Sexual Activity: Not Currently   Other Topics Concern  . None   Social History Narrative   Additional Social History:                          Allergies:   Allergies  Allergen Reactions  . Lisinopril Cough    Labs:  Results for orders placed or performed during the hospital encounter of 10/19/14 (from the past 48 hour(s))  CBC with Differential/Platelet     Status: Abnormal   Collection Time: 10/19/14  2:01 PM  Result Value Ref Range   WBC 10.3 4.0 - 10.5 K/uL   RBC  3.75 (L) 4.22 - 5.81 MIL/uL   Hemoglobin 10.6 (L) 13.0 - 17.0 g/dL   HCT 33.2 (L) 39.0 - 52.0 %   MCV 88.5 78.0 - 100.0 fL   MCH 28.3 26.0 - 34.0 pg   MCHC 31.9 30.0 - 36.0 g/dL   RDW 15.4 11.5 - 15.5 %   Platelets 269 150 - 400 K/uL   Neutrophils Relative % 81 (H) 43 - 77 %   Neutro Abs 8.3 (H) 1.7 - 7.7 K/uL   Lymphocytes Relative 13 12 - 46 %   Lymphs Abs 1.4 0.7 - 4.0 K/uL   Monocytes Relative 5 3 - 12 %   Monocytes Absolute 0.5 0.1 - 1.0 K/uL   Eosinophils Relative 1 0 - 5 %   Eosinophils Absolute 0.1 0.0 - 0.7 K/uL   Basophils Relative 0 0 - 1 %   Basophils Absolute 0.0 0.0 - 0.1 K/uL  Comprehensive metabolic panel  Status: Abnormal   Collection Time: 10/19/14  2:01 PM  Result Value Ref Range   Sodium 137 135 - 145 mmol/L   Potassium 3.5 3.5 - 5.1 mmol/L   Chloride 119 (H) 101 - 111 mmol/L   CO2 9 (L) 22 - 32 mmol/L   Glucose, Bld 123 (H) 65 - 99 mg/dL   BUN 71 (H) 6 - 20 mg/dL   Creatinine, Ser 3.49 (H) 0.61 - 1.24 mg/dL   Calcium 9.7 8.9 - 10.3 mg/dL   Total Protein 7.8 6.5 - 8.1 g/dL   Albumin 4.1 3.5 - 5.0 g/dL   AST 15 15 - 41 U/L   ALT 17 17 - 63 U/L   Alkaline Phosphatase 59 38 - 126 U/L   Total Bilirubin 0.7 0.3 - 1.2 mg/dL   GFR calc non Af Amer 18 (L) >60 mL/min   GFR calc Af Amer 21 (L) >60 mL/min    Comment: (NOTE) The eGFR has been calculated using the CKD EPI equation. This calculation has not been validated in all clinical situations. eGFR's persistently <60 mL/min signify possible Chronic Kidney Disease.    Anion gap 9 5 - 15  CK     Status: None   Collection Time: 10/19/14  2:01 PM  Result Value Ref Range   Total CK 76 49 - 397 U/L  Blood gas, venous     Status: Abnormal   Collection Time: 10/19/14  3:15 PM  Result Value Ref Range   pH, Ven 7.119 (LL) 7.250 - 7.300    Comment: CRITICAL RESULT CALLED TO, READ BACK BY AND VERIFIED WITH: TIM SMITH,RN AT 1527 BY T.BURGESS,RRT,RCP ON 10/19/2014    pCO2, Ven 30.7 (L) 45.0 - 50.0 mmHg   pO2,  Ven 37.7 30.0 - 45.0 mmHg   Bicarbonate 9.5 (L) 20.0 - 24.0 mEq/L   TCO2 9.1 0 - 100 mmol/L   Acid-base deficit 19.1 (H) 0.0 - 2.0 mmol/L   O2 Saturation 65.6 %   Patient temperature 98.6    Collection site VEIN    Drawn by [id number]    Sample type VENOUS   Lactic acid, plasma     Status: None   Collection Time: 10/19/14  4:10 PM  Result Value Ref Range   Lactic Acid, Venous 0.8 0.5 - 2.0 mmol/L  Lactic acid, plasma     Status: None   Collection Time: 10/19/14  7:11 PM  Result Value Ref Range   Lactic Acid, Venous 1.1 0.5 - 2.0 mmol/L  Glucose, capillary     Status: None   Collection Time: 10/19/14  8:17 PM  Result Value Ref Range   Glucose-Capillary 70 65 - 99 mg/dL  MRSA PCR Screening     Status: None   Collection Time: 10/19/14  8:30 PM  Result Value Ref Range   MRSA by PCR NEGATIVE NEGATIVE    Comment:        The GeneXpert MRSA Assay (FDA approved for NASAL specimens only), is one component of a comprehensive MRSA colonization surveillance program. It is not intended to diagnose MRSA infection nor to guide or monitor treatment for MRSA infections.   Urine rapid drug screen (hosp performed)     Status: None   Collection Time: 10/19/14  8:45 PM  Result Value Ref Range   Opiates NONE DETECTED NONE DETECTED   Cocaine NONE DETECTED NONE DETECTED   Benzodiazepines NONE DETECTED NONE DETECTED   Amphetamines NONE DETECTED NONE DETECTED   Tetrahydrocannabinol NONE DETECTED NONE DETECTED  Barbiturates NONE DETECTED NONE DETECTED    Comment:        DRUG SCREEN FOR MEDICAL PURPOSES ONLY.  IF CONFIRMATION IS NEEDED FOR ANY PURPOSE, NOTIFY LAB WITHIN 5 DAYS.        LOWEST DETECTABLE LIMITS FOR URINE DRUG SCREEN Drug Class       Cutoff (ng/mL) Amphetamine      1000 Barbiturate      200 Benzodiazepine   989 Tricyclics       211 Opiates          300 Cocaine          300 THC              50   Urinalysis, Routine w reflex microscopic (not at [facility])     Status: Abnormal    Collection Time: 10/19/14  8:45 PM  Result Value Ref Range   Color, Urine YELLOW YELLOW   APPearance TURBID (A) CLEAR   Specific Gravity, Urine 1.008 1.005 - 1.030   pH 7.0 5.0 - 8.0   Glucose, UA NEGATIVE NEGATIVE mg/dL   Hgb urine dipstick MODERATE (A) NEGATIVE   Bilirubin Urine SMALL (A) NEGATIVE   Ketones, ur NEGATIVE NEGATIVE mg/dL   Protein, ur 30 (A) NEGATIVE mg/dL   Urobilinogen, UA 1.0 0.0 - 1.0 mg/dL   Nitrite NEGATIVE NEGATIVE   Leukocytes, UA LARGE (A) NEGATIVE  Sodium, urine, random     Status: None   Collection Time: 10/19/14  8:45 PM  Result Value Ref Range   Sodium, Ur 66 mmol/L    Comment: Performed at [facility]  Creatinine, urine, random     Status: None   Collection Time: 10/19/14  8:45 PM  Result Value Ref Range   Creatinine, Urine 36.13 mg/dL    Comment: Performed at [facility]  Urine microscopic-add on     Status: Abnormal   Collection Time: 10/19/14  8:45 PM  Result Value Ref Range   Squamous Epithelial / LPF RARE RARE   WBC, UA TOO NUMEROUS TO COUNT <3 WBC/hpf   RBC / HPF 7-10 <3 RBC/hpf   Bacteria, UA MANY (A) RARE  Glucose, capillary     Status: None   Collection Time: 10/20/14 12:24 AM  Result Value Ref Range   Glucose-Capillary 70 65 - 99 mg/dL  CBC     Status: Abnormal   Collection Time: 10/20/14  4:08 AM  Result Value Ref Range   WBC 8.2 4.0 - 10.5 K/uL   RBC 3.41 (L) 4.22 - 5.81 MIL/uL   Hemoglobin 9.8 (L) 13.0 - 17.0 g/dL   HCT 30.2 (L) 39.0 - 52.0 %   MCV 88.6 78.0 - 100.0 fL   MCH 28.7 26.0 - 34.0 pg   MCHC 32.5 30.0 - 36.0 g/dL   RDW 15.5 11.5 - 15.5 %   Platelets 230 150 - 400 K/uL  Comprehensive metabolic panel     Status: Abnormal   Collection Time: 10/20/14  4:08 AM  Result Value Ref Range   Sodium 140 135 - 145 mmol/L   Potassium 2.9 (L) 3.5 - 5.1 mmol/L    Comment: DELTA CHECK NOTED REPEATED TO VERIFY    Chloride 119 (H) 101 - 111 mmol/L   CO2 15 (L) 22 - 32 mmol/L   Glucose, Bld 82 65 - 99 mg/dL    BUN 64 (H) 6 - 20 mg/dL   Creatinine, Ser 2.86 (H) 0.61 - 1.24 mg/dL   Calcium 8.9 8.9 - 10.3  mg/dL   Total Protein 6.4 (L) 6.5 - 8.1 g/dL   Albumin 3.4 (L) 3.5 - 5.0 g/dL   AST 14 (L) 15 - 41 U/L   ALT 14 (L) 17 - 63 U/L   Alkaline Phosphatase 49 38 - 126 U/L   Total Bilirubin 0.4 0.3 - 1.2 mg/dL   GFR calc non Af Amer 23 (L) >60 mL/min   GFR calc Af Amer 27 (L) >60 mL/min    Comment: (NOTE) The eGFR has been calculated using the CKD EPI equation. This calculation has not been validated in all clinical situations. eGFR's persistently <60 mL/min signify possible Chronic Kidney Disease.    Anion gap 6 5 - 15  Phosphorus     Status: None   Collection Time: 10/20/14  4:08 AM  Result Value Ref Range   Phosphorus 3.8 2.5 - 4.6 mg/dL  Hemoglobin A1c     Status: None   Collection Time: 10/20/14  4:08 AM  Result Value Ref Range   Hgb A1c MFr Bld 5.5 4.8 - 5.6 %    Comment: (NOTE)         Pre-diabetes: 5.7 - 6.4         Diabetes: >6.4         Glycemic control for adults with diabetes: <7.0    Mean Plasma Glucose 111 mg/dL    Comment: (NOTE) Performed At: [facility] [place], [place] [id number] Lindon Romp MD [id number]   CK     Status: None   Collection Time: 10/20/14  4:08 AM  Result Value Ref Range   Total CK 71 49 - 397 U/L  Renal function panel     Status: Abnormal   Collection Time: 10/20/14  4:08 AM  Result Value Ref Range   Sodium 140 135 - 145 mmol/L   Potassium 2.9 (L) 3.5 - 5.1 mmol/L   Chloride 120 (H) 101 - 111 mmol/L   CO2 14 (L) 22 - 32 mmol/L   Glucose, Bld 78 65 - 99 mg/dL   BUN 65 (H) 6 - 20 mg/dL   Creatinine, Ser 2.89 (H) 0.61 - 1.24 mg/dL   Calcium 8.7 (L) 8.9 - 10.3 mg/dL   Phosphorus 3.9 2.5 - 4.6 mg/dL   Albumin 3.4 (L) 3.5 - 5.0 g/dL   GFR calc non Af Amer 23 (L) >60 mL/min   GFR calc Af Amer 26 (L) >60 mL/min    Comment: (NOTE) The eGFR has been calculated using the CKD EPI equation. This calculation has not  been validated in all clinical situations. eGFR's persistently <60 mL/min signify possible Chronic Kidney Disease.    Anion gap 6 5 - 15  Glucose, capillary     Status: None   Collection Time: 10/20/14  7:55 AM  Result Value Ref Range   Glucose-Capillary 72 65 - 99 mg/dL  Glucose, capillary     Status: Abnormal   Collection Time: 10/20/14 12:31 PM  Result Value Ref Range   Glucose-Capillary 107 (H) 65 - 99 mg/dL  Glucose, capillary     Status: Abnormal   Collection Time: 10/20/14  6:09 PM  Result Value Ref Range   Glucose-Capillary 102 (H) 65 - 99 mg/dL   Comment 1 Notify RN    Comment 2 Document in Chart   CBC     Status: Abnormal   Collection Time: 10/21/14  4:23 AM  Result Value Ref Range   WBC 8.5 4.0 - 10.5 K/uL   RBC 3.46 (  L) 4.22 - 5.81 MIL/uL   Hemoglobin 10.0 (L) 13.0 - 17.0 g/dL   HCT 29.8 (L) 39.0 - 52.0 %   MCV 86.1 78.0 - 100.0 fL   MCH 28.9 26.0 - 34.0 pg   MCHC 33.6 30.0 - 36.0 g/dL   RDW 14.9 11.5 - 15.5 %   Platelets 253 150 - 400 K/uL  Basic metabolic panel     Status: Abnormal   Collection Time: 10/21/14  4:23 AM  Result Value Ref Range   Sodium 138 135 - 145 mmol/L   Potassium 2.4 (LL) 3.5 - 5.1 mmol/L    Comment: DELTA CHECK NOTED REPEATED TO VERIFY CRITICAL RESULT CALLED TO, READ BACK BY AND VERIFIED WITH: CLARK,J RN [id number] COVINGTON,N    Chloride 109 101 - 111 mmol/L   CO2 21 (L) 22 - 32 mmol/L   Glucose, Bld 103 (H) 65 - 99 mg/dL   BUN 49 (H) 6 - 20 mg/dL   Creatinine, Ser 2.40 (H) 0.61 - 1.24 mg/dL   Calcium 8.4 (L) 8.9 - 10.3 mg/dL   GFR calc non Af Amer 29 (L) >60 mL/min   GFR calc Af Amer 33 (L) >60 mL/min    Comment: (NOTE) The eGFR has been calculated using the CKD EPI equation. This calculation has not been validated in all clinical situations. eGFR's persistently <60 mL/min signify possible Chronic Kidney Disease.    Anion gap 8 5 - 15  Magnesium     Status: None   Collection Time: 10/21/14  4:23 AM  Result Value Ref Range    Magnesium 2.0 1.7 - 2.4 mg/dL    Vitals: Blood pressure 128/73, pulse 73, temperature 97.9 F (36.6 C), temperature source Oral, resp. rate 18, height $RemoveBe'5\' 10"'qwAqpgUhn$  (1.778 m), weight 79.107 kg (174 lb 6.4 oz), SpO2 100 %.  Risk to Self: Is patient at risk for suicide?: No Risk to Others:   Prior Inpatient Therapy:   Prior Outpatient Therapy:    Current Facility-Administered Medications  Medication Dose Route Frequency Provider Last Rate Last Dose  . acetaminophen (TYLENOL) tablet 650 mg  650 mg Oral Q4H PRN Ankit Nanavati, MD      . cefTRIAXone (ROCEPHIN) 1 g in dextrose 5 % 50 mL IVPB  1 g Intravenous Q24H Leann T Poindexter, RPH   1 g at 10/21/14 0644  . heparin injection 5,000 Units  5,000 Units Subcutaneous 3 times per day Oswald Hillock, MD   5,000 Units at 10/21/14 [phone]  . insulin aspart (novoLOG) injection 0-9 Units  0-9 Units Subcutaneous TID WC Oswald Hillock, MD   0 Units at 10/20/14 0930  . ondansetron (ZOFRAN) tablet 4 mg  4 mg Oral Q6H PRN Oswald Hillock, MD       Or  . ondansetron (ZOFRAN) injection 4 mg  4 mg Intravenous Q6H PRN Oswald Hillock, MD      . potassium chloride 10 mEq in 100 mL IVPB  10 mEq Intravenous Q1 Hr x 6 Rhetta Mura Schorr, NP   10 mEq at 10/21/14 0857  . sodium bicarbonate 150 mEq in dextrose 5 % 1,000 mL infusion   Intravenous Continuous Maryann Mikhail, DO 100 mL/hr at 10/20/14 2017      Musculoskeletal: Strength & Muscle Tone: decreased Gait & Station: normal Patient leans: N/A  Psychiatric Specialty Exam: Physical Exam as per history and physical   ROS dry mouth. Dehydrated, depressed, anxious and bizarre behavior. No Fever-chills, No Headache, No changes with Vision or  hearing, reports vertigo No problems swallowing food or Liquids, No Chest pain, Cough or Shortness of Breath, No Abdominal pain, No Nausea or Vommitting, Bowel movements are regular, No Blood in stool or Urine, No dysuria, No new skin rashes or bruises, No new joints pains-aches,   No new weakness, tingling, numbness in any extremity, No recent weight gain or loss, No polyuria, polydypsia or polyphagia,   A full 10 point Review of Systems was done, except as stated above, all other Review of Systems were negative.  Blood pressure 128/73, pulse 73, temperature 97.9 F (36.6 C), temperature source Oral, resp. rate 18, height $RemoveBe'5\' 10"'DIJSmfQCr$  (1.778 m), weight 79.107 kg (174 lb 6.4 oz), SpO2 100 %.Body mass index is 25.02 kg/(m^2).  General Appearance: Bizarre and Disheveled  Eye Contact::  Good  Speech:  Clear and Coherent  Volume:  Decreased  Mood:  Anxious and Depressed  Affect:  Appropriate and Congruent  Thought Process:  Coherent and Goal Directed  Orientation:  Full (Time, Place, and Person)  Thought Content:  Rumination  Suicidal Thoughts:  No  Homicidal Thoughts:  No  Memory:  Immediate;   Good Recent;   Fair  Judgement:  Intact  Insight:  Fair  Psychomotor Activity:  Decreased  Concentration:  Good  Recall:  Good  Fund of Knowledge:Good  Language: Good  Akathisia:  Negative  Handed:  Right  AIMS (if indicated):     Assets:  Communication Skills Desire for Improvement Financial Resources/Insurance Housing Intimacy Leisure Time Resilience Social Support Talents/Skills Transportation  ADL's:  Intact  Cognition: Impaired,  Mild  Sleep:      Medical Decision Making: New problem, with additional work up planned, Review of Psycho-Social Stressors (1), Review or order clinical lab tests (1), Review of Last Therapy Session (1), Review or order medicine tests (1), Review of Medication Regimen & Side Effects (2) and Review of New Medication or Change in Dosage (2)  Treatment Plan Summary: Patient presented with confusion, hallucinations, depression, anxiety mostly due to dehydration secondary to decreased oral intake. Patient has no safety concerns. Patient is willing to participate in the case management. Daily contact with patient to assess and evaluate  symptoms and progress in treatment and Medication management  Plan:  Patient has capacity to make his own medical decisions and living arrangements based on my evaluation today Depression: Citalopram 40 mg daily morning for depression and anxiety Hallucinations: We will give Seroquel 25 mg at bedtime Patient does not meet criteria for psychiatric inpatient admission. Supportive therapy provided about ongoing stressors.   Disposition: Patient will be chest to outpatient medication management when medically stable.   Jshawn Hurta,JANARDHAHA R. 10/21/2014 10:55 AM

## 2014-10-21 NOTE — Progress Notes (Signed)
Patient ID: [name] [name], male   DOB: July 06, 1960, 54 y.o.   MRN: [id number] S: doingw ell on liquid diet, great UOP.   O:BP 128/73 mmHg  Pulse 73  Temp(Src) 97.9 F (36.6 C) (Oral)  Resp 18  Ht 5\' 10"  (1.778 m)  Wt 79.107 kg (174 lb 6.4 oz)  BMI 25.02 kg/m2  SpO2 100%  Intake/Output Summary (Last 24 hours) at 10/21/14 1305 Last data filed at 10/21/14 0700  Gross per 24 hour  Intake   2317 ml  Output   4000 ml  Net  -1683 ml   Intake/Output: I/O last 3 completed shifts: In: 6297.3 [P.O.:1897; I.V.:3350.3; Other:500; IV Piggyback:550] Out: [id number] [Urine:5350]  Intake/Output this shift:    Weight change:  Gen:WD WN WM in NAD CVS:no rub Resp:cta [id number] Ext:no edema, L forearm AVF +T/B   Recent Labs Lab 10/19/14 1401 10/20/14 0408 10/21/14 0423  NA 137 140  140 138  K 3.5 2.9*  2.9* 2.4*  CL 119* 120*  119* 109  CO2 9* 14*  15* 21*  GLUCOSE 123* 78  82 103*  BUN 71* 65*  64* 49*  CREATININE 3.49* 2.89*  2.86* 2.40*  ALBUMIN 4.1 3.4*  3.4*  --   CALCIUM 9.7 8.7*  8.9 8.4*  PHOS  --  3.9  3.8  --   AST 15 14*  --   ALT 17 14*  --    Liver Function Tests:  Recent Labs Lab 10/19/14 1401 10/20/14 0408  AST 15 14*  ALT 17 14*  ALKPHOS 59 49  BILITOT 0.7 0.4  PROT 7.8 6.4*  ALBUMIN 4.1 3.4*  3.4*   No results for input(s): LIPASE, AMYLASE in the last 168 hours. No results for input(s): AMMONIA in the last 168 hours. CBC:  Recent Labs Lab 10/19/14 1401 10/20/14 0408 10/21/14 0423  WBC 10.3 8.2 8.5  NEUTROABS 8.3*  --   --   HGB 10.6* 9.8* 10.0*  HCT 33.2* 30.2* 29.8*  MCV 88.5 88.6 86.1  PLT 269 230 253   Cardiac Enzymes:  Recent Labs Lab 10/19/14 1401 10/20/14 0408  CKTOTAL 76 71   CBG:  Recent Labs Lab 10/20/14 0024 10/20/14 0755 10/20/14 1231 10/20/14 1809 10/21/14 1150  GLUCAP 70 72 107* 102* 122*    . cefTRIAXone (ROCEPHIN)  IV  1 g Intravenous Q24H  . citalopram  40 mg Oral Daily  . heparin  5,000 Units  Subcutaneous 3 times per day  . insulin aspart  0-9 Units Subcutaneous TID WC  . sodium bicarbonate  1,300 mg Oral TID    BMET    Component Value Date/Time   NA 138 10/21/2014 0423   K 2.4* 10/21/2014 0423   CL 109 10/21/2014 0423   CO2 21* 10/21/2014 0423   GLUCOSE 103* 10/21/2014 0423   BUN 49* 10/21/2014 0423   CREATININE 2.40* 10/21/2014 0423   CREATININE 2.53* 08/29/2013 1947   CALCIUM 8.4* 10/21/2014 0423   CALCIUM 9.5 09/24/2014 0900   GFRNONAA 29* 10/21/2014 0423   GFRAA 33* 10/21/2014 0423   CBC    Component Value Date/Time   WBC 8.5 10/21/2014 0423   WBC 9.8 08/29/2013 1947   RBC 3.46* 10/21/2014 0423   RBC 3.92* 10/30/2013 1931   RBC 3.47* 08/29/2013 1947   HGB 10.0* 10/21/2014 0423   HGB 9.0* 08/29/2013 1947   HCT 29.8* 10/21/2014 0423   HCT 28.9* 08/29/2013 1947   PLT 253 10/21/2014 0423   MCV 86.1 10/21/2014 0423  MCV 83.3 08/29/2013 1947   MCH 28.9 10/21/2014 0423   MCH 25.9* 08/29/2013 1947   MCHC 33.6 10/21/2014 0423   MCHC 31.1* 08/29/2013 1947   RDW 14.9 10/21/2014 0423   LYMPHSABS 1.4 10/19/2014 1401   MONOABS 0.5 10/19/2014 1401   EOSABS 0.1 10/19/2014 1401   BASOSABS 0.0 10/19/2014 1401     Assessment/Plan: 1. AKI/CKD- in setting of hypotension and AMS. Worrisome for recurrence of possible urosepsis vs. Volume depletion. Has h/o similar episode in August 2015 which responded to IVF's and antibiotics. Renal US without change in mild hydronephrosis on right.  1. Improving with IVF's; back to prev baseline 2. Normal UOP 3. Recovered 2. Metabolic acidosis- resolved, likely flairs with low UOP and inc tubular resorption.  [place]agne to NaHCO3 1300 TID  3. AMS- improving.  U Cx pending.  On rocephin 4. Vascular access- L AVF +T/B mature and ready for use when needed. 5. Hypokalemia- repleted and follow. 6. DM- not on metformin. Management per primary svc.  Rexene Agent

## 2014-10-21 NOTE — Progress Notes (Signed)
Pt became very upset while watching TV, he thought he said something mean to his sitter, which she told him he hadn't. He kept insisting that he had said something mean to her. Pt began to sob. He couldn't be convinced. Sitter stated she was going to turn off TV and he again became very upset insisting she not turn it off and to leave it on the same channel. He repeatedly asked her if it was the same channel. He then walked backwards into the dark bathroom and stood against the wall and told his sitter to watch him. MD was notified and orders given. Psych consult was ordered. Pt requested to walk around unit with sitter, MD OK'D.

## 2014-10-21 NOTE — Clinical Social Work Psych Assess (Signed)
Clinical Social Work Nature conservation officer  Clinical Social Worker:  Boone Master,  Date/Time:  10/21/2014, 12:48 PM Referred By:  Physician Date Referred:  10/21/14 Reason for Referral:  Behavioral Health Issues   Presenting Symptoms/Problems  Presenting Symptoms/Problems(in person's/family's own words):  Psych consulted due to confusion.  Abuse/Neglect/Trauma History  Abuse/Neglect/Trauma History:  Denies History Abuse/Neglect/Trauma History Comments (indicate dates):  N/A   Psychiatric History  Psychiatric History:  Inpatient/Hospitalization, Outpatient Treatment Psychiatric Medication:  Celexa   Current Mental Health Hospitalizations/Previous Mental Health History:  Patient was diagnosed with depression about 9 years ago. Patient feels depression is related to medical problems.   Current Provider:  PCP Place and Date:  [place], [facility]  Current Medications:   Scheduled Meds: . cefTRIAXone (ROCEPHIN)  IV  1 g Intravenous Q24H  . citalopram  40 mg Oral Daily  . heparin  5,000 Units Subcutaneous 3 times per day  . insulin aspart  0-9 Units Subcutaneous TID WC  . potassium chloride  10 mEq Intravenous Q1 Hr x 6  . sodium bicarbonate  1,300 mg Oral TID   Continuous Infusions:  PRN Meds:.acetaminophen, LORazepam, ondansetron **OR** ondansetron (ZOFRAN) IV     Previous Inpatient Admission/Date/Reason:  Patient has been admitted to [facility] about 9 years ago following overdose.   Emotional Health/Current Symptoms  Suicide/Self Harm: Suicide Attempt in the Past (date/description) (Patient attempted to overdose twice about 9 years ago.) Suicide Attempt in Past (date/description):  Patient overdosed twice in the past.  Other Harmful Behavior (ex. homicidal ideation) (describe):  None reported   Psychotic/Dissociative Symptoms  Psychotic/Dissociative Symptoms: Paranoia (RN feels that patient has been paranoid re: Actuary) Other Psychotic/Dissociative  Symptoms:  Patient denies any symptoms but RN has concerns about labile affect.   Attention/Behavioral Symptoms  Attention/Behavioral Symptoms: Within Normal Limits Other Attention/Behavioral Symptoms:  Patient engaged during assessment.   Cognitive Impairment  Cognitive Impairment:  Within Normal Limits Other Cognitive Impairment:  Patient alert and oriented.   Mood and Adjustment  Mood and Adjustment:  Flat   Stress, Anxiety, Trauma, Any Recent Loss/Stressor  Stress, Anxiety, Trauma, Any Recent Loss/Stressor: Other - See Comment (Patient has medical problems such as stage 4 kidney disease) Anxiety (frequency):  N/A  Phobia (specify):  N/A  Compulsive Behavior (specify):  N/A  Obsessive Behavior (specify):  N/A  Other Stress, Anxiety, Trauma, Any Recent Loss/Stressor:  N/A   Substance Abuse/Use  Substance Abuse/Use: None SBIRT Completed (please refer for detailed history): No Self-reported Substance Use (last use and frequency):  Patient denies any substance use.  Urinary Drug Screen Completed: Yes Alcohol Level:  <10   Environment/Housing/Living Arrangement  Environmental/Housing/Living Arrangement: Stable Housing Who is in the Home:  Wife  Emergency Contact:  [name]-spouse   Financial  Financial: Private Insurance   Patient's Strengths and Goals  Patient's Strengths and Goals (patient's own words):  Patient has supportive family and is compliant with medications.   Clinical Social Worker's Interpretive Summary  Clinical Social Workers Interpretive Summary:    CSW received referral to complete psychosocial assessment. CSW reviewed chart and met with patient at bedside with psych MD.  Patient reports he does not currently have a psychiatrist but was diagnosed with depression about 9 years ago. Patient reports he had several medical problems including bladder cancer and kidney disease. Patient attempted to overdose and was placed at [facility] for  treatment. Patient was started on Celexa and his PCP has been prescribing medications for him.   Patient reports he was hospitalized  this admission due to confusion but was informed that medical problems were contributing to altered mental status. Patient did well on mini mental status exam and appeared to have no confusion currently.  Psych MD to make medication recommendations.   Disposition  Disposition: Recommend Psych CSW Continuing To Support While In [facility] [place]

## 2014-10-21 NOTE — Evaluation (Signed)
SLP Cancellation Note  Patient Details Name: [name] [name] MRN: [id number] DOB: 1960/06/03   Cancelled treatment:       Reason Eval/Treat Not Completed: Other (comment) (RN reports pt currently not adequately alert for po/eval, he received medications, will re-attempt evaluation at another time)   Claudie Fisherman, [facility] SLP [phone]

## 2014-10-21 NOTE — Progress Notes (Signed)
CRITICAL VALUE ALERT  Critical value received:  K+ 2.4  Date of notification:  10/21/14  Time of notification:  0520  Critical value read back:Yes.    Nurse who received alert:  Harlow Asa  MD notified (1st page):  Hospitalist  Time of first page:  [phone]  MD notified (2nd page):  Time of second page:  Responding MD:  Hospitalist  Time MD responded:  [phone]

## 2014-10-22 ENCOUNTER — Inpatient Hospital Stay (HOSPITAL_COMMUNITY): Admission: RE | Admit: 2014-10-22 | Payer: BC Managed Care – PPO | Source: Ambulatory Visit

## 2014-10-22 DIAGNOSIS — E876 Hypokalemia: Secondary | ICD-10-CM

## 2014-10-22 DIAGNOSIS — N390 Urinary tract infection, site not specified: Principal | ICD-10-CM

## 2014-10-22 LAB — BASIC METABOLIC PANEL
Anion gap: 8 (ref 5–15)
BUN: 34 mg/dL — ABNORMAL HIGH (ref 6–20)
CALCIUM: 9.3 mg/dL (ref 8.9–10.3)
CO2: 26 mmol/L (ref 22–32)
Chloride: 104 mmol/L (ref 101–111)
Creatinine, Ser: 2.2 mg/dL — ABNORMAL HIGH (ref 0.61–1.24)
GFR calc Af Amer: 37 mL/min — ABNORMAL LOW (ref >60)
GFR, EST NON AFRICAN AMERICAN: 32 mL/min — AB (ref >60)
GLUCOSE: 122 mg/dL — AB (ref 65–99)
POTASSIUM: 2.9 mmol/L — AB (ref 3.5–5.1)
Sodium: 138 mmol/L (ref 135–145)

## 2014-10-22 LAB — CBC
HCT: 33 % — ABNORMAL LOW (ref 39.0–52.0)
Hemoglobin: 10.7 g/dL — ABNORMAL LOW (ref 13.0–17.0)
MCH: 28.6 pg (ref 26.0–34.0)
MCHC: 32.4 g/dL (ref 30.0–36.0)
MCV: 88.2 fL (ref 78.0–100.0)
Platelets: 282 10*3/uL (ref 150–400)
RBC: 3.74 MIL/uL — AB (ref 4.22–5.81)
RDW: 14.8 % (ref 11.5–15.5)
WBC: 6.8 10*3/uL (ref 4.0–10.5)

## 2014-10-22 LAB — GLUCOSE, CAPILLARY
GLUCOSE-CAPILLARY: 87 mg/dL (ref 65–99)
Glucose-Capillary: 100 mg/dL — ABNORMAL HIGH (ref 65–99)
Glucose-Capillary: 87 mg/dL (ref 65–99)
Glucose-Capillary: 99 mg/dL (ref 65–99)

## 2014-10-22 LAB — MAGNESIUM: Magnesium: 2.2 mg/dL (ref 1.7–2.4)

## 2014-10-22 MED ORDER — POTASSIUM CHLORIDE CRYS ER 20 MEQ PO TBCR
40.0000 meq | EXTENDED_RELEASE_TABLET | Freq: Two times a day (BID) | ORAL | Status: AC
  Administered 2014-10-22 – 2014-10-23 (×3): 40 meq via ORAL
  Filled 2014-10-22 (×3): qty 2

## 2014-10-22 NOTE — Evaluation (Signed)
Clinical/Bedside Swallow Evaluation Patient Details  Name: [name] [name] MRN: [id number] Date of Birth: 05/11/1960  Today's Date: 10/22/2014 Time: SLP Start Time (ACUTE ONLY): 1301 SLP Stop Time (ACUTE ONLY): 1340 SLP Time Calculation (min) (ACUTE ONLY): 39 min  Past Medical History:  Past Medical History  Diagnosis Date  . Gout     recent flair -bilateral feet--  STABLE  . Frequency of urination   . Urgency incontinence   . Nocturia   . Diabetes mellitus type 2, diet-controlled PT STOPPED TAKING METFORMIN--  HAS BEEN WATCHING DIET, EXERCISING AND LOSING WT  . Bladder cancer 2013  . Complication of anesthesia     agitation w/awakening in 9/13,OK 01/24/12  . Hypertension   . Anxiety   . Depression   . GERD (gastroesophageal reflux disease)   . Acute renal injury 11/2013  . Anemia   . Kidney failure    Past Surgical History:  Past Surgical History  Procedure Laterality Date  . Ulner artery repair Right 2009    rt -trauma /thrombectomy,repair  . Transurethral resection of bladder tumor  12/15/2011    Procedure: TRANSURETHRAL RESECTION OF BLADDER TUMOR (TURBT);  Surgeon: Molli Hazard, MD;  Location: [facility];  Service: Urology;  Laterality: N/A;  2 HRS   . Lumbar laminectomy  06-02-2005    W/ RESECTION NERVE ROOT  L4  -  L5  . Transurethral resection of bladder tumor  01/24/2012    Procedure: TRANSURETHRAL RESECTION OF BLADDER TUMOR (TURBT);  Surgeon: Molli Hazard, MD;  Location: [facility];  Service: Urology;  Laterality: N/A;  90 MIN   . Cystoscopy with urethral dilatation  01/24/2012    Procedure: CYSTOSCOPY WITH URETHRAL DILATATION;  Surgeon: Molli Hazard, MD;  Location: [facility];  Service: Urology;  Laterality: N/A;  . Transurethral resection of bladder tumor N/A 07/10/2012    Procedure: TRANSURETHRAL RESECTION OF BLADDER TUMOR (TURBT);  Surgeon: Molli Hazard, MD;  Location: [facility];  Service: Urology;  Laterality: N/A;  . Cystoscopy w/ retrogrades Bilateral 07/10/2012    Procedure: CYSTOSCOPY WITH RETROGRADE PYELOGRAM;  Surgeon: Molli Hazard, MD;  Location: [facility];  Service: Urology;  Laterality: Bilateral;  . Back surgery    . Bladder surgery  04/26/2013    at the [facility] [place] [facility]  [place]    . Av fistula placement Left 05/31/2014    Procedure: LEFT RADIOCEPHALIC ARTERIOVENOUS (AV) FISTULA CREATION ;  Surgeon: Mal Misty, MD;  Location: [facility] OR;  Service: Vascular;  Laterality: Left;   HPI:  PMH + for 54 year old male adm with AMS.  Past medical history of bladder cancer, anxiety and depression, chronic kidney disease stage 4 with AV fistula in place in anticipation for eventual HD, diabetes (diet controlled).  On the admission, patient was hemodynamically stable. He was found to have severe metabolic acidosis with a CO2 of 9. His urine drug screen was within normal limits per MD note.  His urinalysis revealed large leukocytes and many bacteria for which reason he was started on empiric Rocephin for urinary tract infection.   Assessment / Plan / Recommendation Clinical Impression  Pt with negative CN exam and no s/s of aspiration or dysphagia with all po observed.  SLP observed pt self feeding peanut butter and crackers, applesauce and water.  Pt denies any deficits with swallowing intake observed today.  He does report problems with "acidosis" stating food/drink will come  back up if he eats too much.  In addition pt admits to occasional issues with foods such as cornbread lodging in distal esophagus - consumption of liquid he reports does not help and only adds to residue.    SLP provided pt with esophageal precautions given symptoms consistent with esoph dysphagia.  Pt reports he can not take reflux medications due to his CKD.  Advised pt to monitor his dysphagia closely and follow up with OP GI if indicated.      Aspiration Risk  Mild    Diet Recommendation Age appropriate regular solids;Thin   Medication Administration: Whole meds with liquid Compensations: Slow rate;Small sips/bites (small amounts, consume liquids t/o meal)    Other  Recommendations Oral Care Recommendations: Oral care BID   Follow Up Recommendations       Frequency and Duration        Pertinent Vitals/Pain Afebrile, decreased      Swallow Study Prior Functional Status   see [place] Date of Onset: 10/22/14 Other Pertinent Information: PMH + for 54 year old male adm with AMS.  Past medical history of bladder cancer, anxiety and depression, chronic kidney disease stage 4 with AV fistula in place in anticipation for eventual HD, diabetes (diet controlled).  On the admission, patient was hemodynamically stable. He was found to have severe metabolic acidosis with a CO2 of 9. His urine drug screen was within normal limits per MD note.  His urinalysis revealed large leukocytes and many bacteria for which reason he was started on empiric Rocephin for urinary tract infection. Type of Study: Bedside swallow evaluation Diet Prior to this Study: Thin liquids;Other (Comment) (clears) Temperature Spikes Noted: No Respiratory Status: Room air History of Recent Intubation: No Behavior/Cognition: Alert;Cooperative;Pleasant mood Oral Cavity - Dentition: Adequate natural dentition/normal for age Self-Feeding Abilities: Able to feed self Patient Positioning: Upright in bed Baseline Vocal Quality: Normal Volitional Cough: Strong Volitional Swallow: Able to elicit (pt reported throat pain on right upon admit)    Oral/Motor/Sensory Function Overall Oral Motor/Sensory Function: Appears within functional limits for tasks assessed   Ice Chips Ice chips: Not tested   Thin Liquid Thin Liquid: Within functional limits Presentation: Straw    Nectar Thick Nectar Thick Liquid: Not tested   Honey Thick Honey Thick Liquid: Not tested    Puree Puree: Within functional limits Presentation: Self Fed;Spoon   Solid   GO    Solid: Within functional limits Presentation: Self Fed;Spoon       Luanna Salk, [facility] SLP [phone]

## 2014-10-22 NOTE — Progress Notes (Signed)
Patient ID: [name] [name], male   DOB: 1960/06/08, 54 y.o.   MRN: [id number] [facility] PROGRESS NOTE  [name] [name] [id number] DOB: [birth year] DOA: 10/19/2014 PCP: Florina Ou, MD  Brief narrative:    54 year old male with past medical history of bladder cancer, anxiety and depression, chronic kidney disease stage 4 with AV fistula in place in anticipation for eventual HD, diabetes (diet controlled) who presented to [facility] with altered mental status and poor by mouth intake as reported by patient's wife at the time of the admission. On the admission, patient was hemodynamically stable. He was found to have severe metabolic acidosis with a CO2 of 9. His urine drug screen was within normal limits. His urinalysis revealed large leukocytes and many bacteria for which reason he was started on empiric Rocephin for urinary tract infection.  Barrier to discharge: Home likely by 10/23/2014 if potassium and renal function improving and stable.    Assessment/Plan:    Principal problem: Acute encephalopathy - Unclear etiology however possibility includes urinary tract infection as well as depression - UDS WNL - Psych consulted and added Seroquel - Resumed Celexa  - CT head with no acute intracranial findings - Continue Rocephin for urinary tract infection  Active problems: Chronic kidney disease, stage IV / acute on chronic kidney disease / metabolic acidosis - Nephrology is following, appreciate their input - Patient was initially on bicarbonate infusion. He was started on sodium bicarbonate 10/22/2014 1300 mg by mouth 3 times a day. - Creatinine improved in past 24 hours. - Patient has left AV fistula, per nephrology ready to use when needed  Anemia of chronic disease - Secondary to chronic kidney disease - Hemoglobin is stable  Acute urinary tract infection - Large leukocytes and many bacteria seen on admission on urinalysis - Continue Rocephin until urine  culture results are reported. Urine culture is re-incubated for better growth.  Hypokalemia - Likely from CKD, metabolic acidosis - Continue to supplement potassium  Questionable dysphagia vs aspiration - Had coughing after drinking fluids  - Speech therapy consulted - not done yesterday because of patient's altered mental status but he is much better this morning so hopefully it will be completed today.  Diabetes mellitus, type 2 with episodes of hypoglycemia - Controlled   History of bladder cancer - Stable    DVT Prophylaxis  - Heparin subcutaneous ordered while pt in hospital    Code Status: Full.  Family Communication:  plan of care discussed with the patient and his wife. Disposition Plan: Home once renal function improves and potassium level more stable   IV access:  Peripheral IV  Procedures and diagnostic studies:    Dg Chest 2 View 10/19/2014   No active cardiopulmonary disease.     Ct Head Wo Contrast 10/19/2014  No evidence of acute intracranial abnormality.     US Renal 10/19/2014   Mild right-sided hydronephrosis stable from the prior exam. No other focal abnormality is seen.     Medical Consultants:  Nephrology Psychiatry    Other Consultants:  None   IAnti-Infectives:   Rocephin 10/20/2014 -->    Leisa Lenz, MD  [facility] Pager [phone]  Time spent in minutes: 25 minutes  If 7PM-7AM, please contact night-coverage [email or web address] Password [facility] 10/22/2014, 11:31 AM   LOS: 3 days    HPI/Subjective: No acute overnight events. Patient reports no nausea or vomiting, no shortness of breath.  Objective: Filed Vitals:   10/21/14 0425 10/21/14 1312 10/21/14 2055 10/22/14 [id number]  BP: 128/73 136/74 135/75 138/78  Pulse: 73 67 74 86  Temp: 97.9 F (36.6 C)  98.4 F (36.9 C)   TempSrc: Oral  Oral Oral  Resp: 18 18 16 16   Height:      Weight:      SpO2: 100% 100% 100% 99%    Intake/Output Summary (Last 24 hours) at 10/22/14 1131 Last  data filed at 10/22/14 1120  Gross per 24 hour  Intake   2580 ml  Output   2075 ml  Net    505 ml    Exam:   General:  Pt is alert, mental status better this morning  Cardiovascular: Rate controlled, appreciate S1, S2  Respiratory: No wheezing, no rhonchi  Abdomen: Appreciate bowel sounds, nontender and nondistended abdomen  Extremities: No leg swelling, pulses palpable bilaterally  Neuro: No focal neurological deficits  Data Reviewed: Basic Metabolic Panel:  Recent Labs Lab 10/19/14 1401 10/20/14 0408 10/21/14 0423 10/22/14 0910  NA 137 140  140 138 138  K 3.5 2.9*  2.9* 2.4* 2.9*  CL 119* 120*  119* 109 104  CO2 9* 14*  15* 21* 26  GLUCOSE 123* 78  82 103* 122*  BUN 71* 65*  64* 49* 34*  CREATININE 3.49* 2.89*  2.86* 2.40* 2.20*  CALCIUM 9.7 8.7*  8.9 8.4* 9.3  MG  --   --  2.0 2.2  PHOS  --  3.9  3.8  --   --    Liver Function Tests:  Recent Labs Lab 10/19/14 1401 10/20/14 0408  AST 15 14*  ALT 17 14*  ALKPHOS 59 49  BILITOT 0.7 0.4  PROT 7.8 6.4*  ALBUMIN 4.1 3.4*  3.4*   No results for input(s): LIPASE, AMYLASE in the last 168 hours. No results for input(s): AMMONIA in the last 168 hours. CBC:  Recent Labs Lab 10/19/14 1401 10/20/14 0408 10/21/14 0423 10/22/14 0910  WBC 10.3 8.2 8.5 6.8  NEUTROABS 8.3*  --   --   --   HGB 10.6* 9.8* 10.0* 10.7*  HCT 33.2* 30.2* 29.8* 33.0*  MCV 88.5 88.6 86.1 88.2  PLT 269 230 253 282   Cardiac Enzymes:  Recent Labs Lab 10/19/14 1401 10/20/14 0408  CKTOTAL 76 71   BNP: Invalid input(s): POCBNP CBG:  Recent Labs Lab 10/21/14 0725 10/21/14 1150 10/21/14 1706 10/21/14 2157 10/22/14 0724  GLUCAP 95 122* 156* 106* 100*    Recent Results (from the past 240 hour(s))  MRSA PCR Screening     Status: None   Collection Time: 10/19/14  8:30 PM  Result Value Ref Range Status   MRSA by PCR NEGATIVE NEGATIVE Final     Scheduled Meds: . cefTRIAXone (ROCEPHIN)  IV  1 g Intravenous  Q24H  . citalopram  40 mg Oral Daily  . heparin  5,000 Units Subcutaneous 3 times per day  . insulin aspart  0-9 Units Subcutaneous TID WC  . potassium chloride  40 mEq Oral BID  . QUEtiapine  25 mg Oral QHS  . sodium bicarbonate  1,300 mg Oral TID   Continuous Infusions:

## 2014-10-22 NOTE — Progress Notes (Signed)
Clinical Social Work  CSW and psych MD rounded on patient. Patient sitting in room and reports he is doing well. Patient feels that medication is helpful and that he does not have any concerns. Patient reports he plans to return home with wife at DC and is hopeful to DC soon.   [place], Lyle [phone]

## 2014-10-22 NOTE — Progress Notes (Signed)
Patient ID: [name] [name], male   DOB: 01/18/1961, 54 y.o.   MRN: [id number] S: Doing well, no co.  Eating.  U Cx still pending O:BP 155/69 mmHg  Pulse 74  Temp(Src) 98.3 F (36.8 C) (Oral)  Resp 18  Ht 5\' 10"  (1.778 m)  Wt 79.107 kg (174 lb 6.4 oz)  BMI 25.02 kg/m2  SpO2 100%  Intake/Output Summary (Last 24 hours) at 10/22/14 1504 Last data filed at 10/22/14 1447  Gross per 24 hour  Intake   3180 ml  Output   1875 ml  Net   1305 ml   Intake/Output: I/O last 3 completed shifts: In: 78 [P.O.:2400; I.V.:1100; IV Piggyback:600] Out: 5100 [Urine:5100]  Intake/Output this shift:  Total I/O In: 1740 [P.O.:1740] Out: 675 [Urine:675] Weight change:  Gen:WD WN WM in NAD CVS:no rub Resp:cta [id number] Ext:no edema, L forearm AVF +T/B   Recent Labs Lab 10/19/14 1401 10/20/14 0408 10/21/14 0423 10/22/14 0910  NA 137 140  140 138 138  K 3.5 2.9*  2.9* 2.4* 2.9*  CL 119* 120*  119* 109 104  CO2 9* 14*  15* 21* 26  GLUCOSE 123* 78  82 103* 122*  BUN 71* 65*  64* 49* 34*  CREATININE 3.49* 2.89*  2.86* 2.40* 2.20*  ALBUMIN 4.1 3.4*  3.4*  --   --   CALCIUM 9.7 8.7*  8.9 8.4* 9.3  PHOS  --  3.9  3.8  --   --   AST 15 14*  --   --   ALT 17 14*  --   --    Liver Function Tests:  Recent Labs Lab 10/19/14 1401 10/20/14 0408  AST 15 14*  ALT 17 14*  ALKPHOS 59 49  BILITOT 0.7 0.4  PROT 7.8 6.4*  ALBUMIN 4.1 3.4*  3.4*   No results for input(s): LIPASE, AMYLASE in the last 168 hours. No results for input(s): AMMONIA in the last 168 hours. CBC:  Recent Labs Lab 10/19/14 1401 10/20/14 0408 10/21/14 0423 10/22/14 0910  WBC 10.3 8.2 8.5 6.8  NEUTROABS 8.3*  --   --   --   HGB 10.6* 9.8* 10.0* 10.7*  HCT 33.2* 30.2* 29.8* 33.0*  MCV 88.5 88.6 86.1 88.2  PLT 269 230 253 282   Cardiac Enzymes:  Recent Labs Lab 10/19/14 1401 10/20/14 0408  CKTOTAL 76 71   CBG:  Recent Labs Lab 10/21/14 1150 10/21/14 1706 10/21/14 2157 10/22/14 0724  10/22/14 1154  GLUCAP 122* 156* 106* 100* 87    . cefTRIAXone (ROCEPHIN)  IV  1 g Intravenous Q24H  . citalopram  40 mg Oral Daily  . heparin  5,000 Units Subcutaneous 3 times per day  . insulin aspart  0-9 Units Subcutaneous TID WC  . potassium chloride  40 mEq Oral BID  . QUEtiapine  25 mg Oral QHS  . sodium bicarbonate  1,300 mg Oral TID    BMET    Component Value Date/Time   NA 138 10/22/2014 0910   K 2.9* 10/22/2014 0910   CL 104 10/22/2014 0910   CO2 26 10/22/2014 0910   GLUCOSE 122* 10/22/2014 0910   BUN 34* 10/22/2014 0910   CREATININE 2.20* 10/22/2014 0910   CREATININE 2.53* 08/29/2013 1947   CALCIUM 9.3 10/22/2014 0910   CALCIUM 9.5 09/24/2014 0900   GFRNONAA 32* 10/22/2014 0910   GFRAA 37* 10/22/2014 0910   CBC    Component Value Date/Time   WBC 6.8 10/22/2014 0910   WBC  9.8 08/29/2013 1947   RBC 3.74* 10/22/2014 0910   RBC 3.92* 10/30/2013 1931   RBC 3.47* 08/29/2013 1947   HGB 10.7* 10/22/2014 0910   HGB 9.0* 08/29/2013 1947   HCT 33.0* 10/22/2014 0910   HCT 28.9* 08/29/2013 1947   PLT 282 10/22/2014 0910   MCV 88.2 10/22/2014 0910   MCV 83.3 08/29/2013 1947   MCH 28.6 10/22/2014 0910   MCH 25.9* 08/29/2013 1947   MCHC 32.4 10/22/2014 0910   MCHC 31.1* 08/29/2013 1947   RDW 14.8 10/22/2014 0910   LYMPHSABS 1.4 10/19/2014 1401   MONOABS 0.5 10/19/2014 1401   EOSABS 0.1 10/19/2014 1401   BASOSABS 0.0 10/19/2014 1401     Assessment/Plan: 1. AKI/CKD- in setting of hypotension and AMS.  1. Normal UOP 2. Recovered 2. Metabolic acidosis- resolved, likely flairs with low UOP and inc tubular resorption.  Changed to NaHCO3 1300 TID  3. AMS- improving.  U Cx pending.  On rocephin 4. Vascular access- L AVF +T/B mature and ready for use when needed. 5. Hypokalemia- repleted and follow. 6. DM- not on metformin. Management per primary svc.  Will sign off for now.  Please call with any questions or concerns.  Pt does need follow up with nephrology  sees [place]. I wil arrange. RS   Georgie Eduardo B

## 2014-10-22 NOTE — Consult Note (Signed)
[facility] Face-to-Face Psychiatry Consult follow-up note  Reason for Consult:  AMS, depression and hallucinations  Referring Physician:  Dr. Charlies Silvers Patient Identification: [name] MRN:  [id number] Principal Diagnosis: Altered mental status Diagnosis:   Patient Active Problem List   Diagnosis Date Noted  . Acute encephalopathy [G93.40] 10/19/2014  . Altered mental status [R41.82] 10/19/2014  . Faintness [R55]   . Syncope [R55] 07/06/2014  . Syncope and collapse [R55] 07/05/2014  . CKD (chronic kidney disease), stage IV [N18.4] 07/05/2014  . Acute coccygeal pain [M53.3] 07/05/2014  . Occipital scalp laceration [S01.00XA] 07/05/2014  . Diabetes mellitus type 2, diet-controlled [E11.9] 07/05/2014  . Protein calorie malnutrition [E46] 11/29/2013  . AKI (acute kidney injury) [N17.9] 10/30/2013  . Hypercalcemia [E83.52] 06/26/2013  . UTI (lower urinary tract infection) [N39.0] 06/25/2013  . Bladder cancer [C67.9] 05/09/2013    Total Time spent with patient: 20 minutes  Subjective:   [name] [name] is a 54 y.o. male patient admitted with AMS and confusion.  HPI:   [name] [name] is a 54 years old male seen, chart reviewed and case discussed with psychiatric inpatient social service for psychiatric consultation and evaluation of altered mental status with history of depression and hallucinations/bizarre behaviors. Patient reported suffering with depression over 9 years and history of acute inpatient psychiatric hospitalization for overdose twice again 8 years. Patient has been receiving antidepression medication from primary care physician. Patient presented to the hospital with altered mental status, his wife cannot understand his inappropriate and incomprehensible language and also reportedly suffering with bladder cancer and had neo-bladder and chronic kidney disease over 2 . Patient reportedly not able to eat or drink recently. Patient has mild confusion during my evaluation but does  not appear to be delirious or demented. Patient has orientation to time place person and situation, concentration and memory and delayed memory is 2 out of 3. Patient has intact language functions. Patient is willing to take his medication as prescribed. Patient has no acute suicidal/homicidal ideation, intention or plans.   Interval history: Patient appeared as per his stated age, in her dressed in hospital calm, taken shower and groomed well. Patient appeared sitting in a bench next to his bed and has a Air cabin crew in his room. Patient reported that he has a swallow study which he passed without difficulties. Patient has been compliant with his medication management and reported no side effects. Patient denies current symptoms of depression anxiety. Patient reported he slept better and feeling better today.. Patient stated he spoke with his wife 3 times today and also on the staff members no one had any -2 reports about his behaviors are mental status. Patient Denied Any Confusion or mood swings today. Patient can be psychiatrically cleared for discharge and outpatient medication management at this time.   Past Medical History:  Past Medical History  Diagnosis Date  . Gout     recent flair -bilateral feet--  STABLE  . Frequency of urination   . Urgency incontinence   . Nocturia   . Diabetes mellitus type 2, diet-controlled PT STOPPED TAKING METFORMIN--  HAS BEEN WATCHING DIET, EXERCISING AND LOSING WT  . Bladder cancer 2013  . Complication of anesthesia     agitation w/awakening in 9/13,OK 01/24/12  . Hypertension   . Anxiety   . Depression   . GERD (gastroesophageal reflux disease)   . Acute renal injury 11/2013  . Anemia   . Kidney failure     Past Surgical History  Procedure Laterality  Date  . [place] artery repair Right 2009    rt -trauma /thrombectomy,repair  . Transurethral resection of bladder tumor  12/15/2011    Procedure: TRANSURETHRAL RESECTION OF BLADDER TUMOR (TURBT);   Surgeon: Molli Hazard, MD;  Location: [facility];  Service: Urology;  Laterality: N/A;  2 HRS   . Lumbar laminectomy  06-02-2005    W/ RESECTION NERVE ROOT  L4  -  L5  . Transurethral resection of bladder tumor  01/24/2012    Procedure: TRANSURETHRAL RESECTION OF BLADDER TUMOR (TURBT);  Surgeon: Molli Hazard, MD;  Location: [facility];  Service: Urology;  Laterality: N/A;  90 MIN   . Cystoscopy with urethral dilatation  01/24/2012    Procedure: CYSTOSCOPY WITH URETHRAL DILATATION;  Surgeon: Molli Hazard, MD;  Location: [facility];  Service: Urology;  Laterality: N/A;  . Transurethral resection of bladder tumor N/A 07/10/2012    Procedure: TRANSURETHRAL RESECTION OF BLADDER TUMOR (TURBT);  Surgeon: Molli Hazard, MD;  Location: [facility];  Service: Urology;  Laterality: N/A;  . Cystoscopy w/ retrogrades Bilateral 07/10/2012    Procedure: CYSTOSCOPY WITH RETROGRADE PYELOGRAM;  Surgeon: Molli Hazard, MD;  Location: [facility];  Service: Urology;  Laterality: Bilateral;  . Back surgery    . Bladder surgery  04/26/2013    at the [facility] [place] in  [place]    . Av fistula placement Left 05/31/2014    Procedure: LEFT RADIOCEPHALIC ARTERIOVENOUS (AV) FISTULA CREATION ;  Surgeon: Mal Misty, MD;  Location[facility] OR;  Service: Vascular;  Laterality: Left;   Family History:  Family History  Problem Relation Age of Onset  . Diabetes Mellitus II Other   . Hypertension Other   . Bladder Cancer Neg Hx    Social History:  History  Alcohol Use No     History  Drug Use No    History   Social History  . Marital Status: Married    Spouse Name: N/A  . Number of Children: N/A  . Years of Education: N/A   Social History Main Topics  . Smoking status: Former Smoker -- 1.00 packs/day for 16 years    Types: Cigarettes    Quit date: 01/20/1992  . Smokeless  tobacco: Former Systems developer    Types: [place] date: 01/20/1992  . Alcohol Use: No  . Drug Use: No  . Sexual Activity: Not Currently   Other Topics Concern  . None   Social History Narrative   Additional Social History:                          Allergies:   Allergies  Allergen Reactions  . Lisinopril Cough    Labs:  Results for orders placed or performed during the hospital encounter of 10/19/14 (from the past 48 hour(s))  Glucose, capillary     Status: Abnormal   Collection Time: 10/20/14  6:09 PM  Result Value Ref Range   Glucose-Capillary 102 (H) 65 - 99 mg/dL   Comment 1 Notify RN    Comment 2 Document in Chart   CBC     Status: Abnormal   Collection Time: 10/21/14  4:23 AM  Result Value Ref Range   WBC 8.5 4.0 - 10.5 K/uL   RBC 3.46 (L) 4.22 - 5.81 MIL/uL   Hemoglobin 10.0 (L) 13.0 - 17.0 g/dL   HCT 29.8 (L)  39.0 - 52.0 %   MCV 86.1 78.0 - 100.0 fL   MCH 28.9 26.0 - 34.0 pg   MCHC 33.6 30.0 - 36.0 g/dL   RDW 14.9 11.5 - 15.5 %   Platelets 253 150 - 400 K/uL  Basic metabolic panel     Status: Abnormal   Collection Time: 10/21/14  4:23 AM  Result Value Ref Range   Sodium 138 135 - 145 mmol/L   Potassium 2.4 (LL) 3.5 - 5.1 mmol/L    Comment: DELTA CHECK NOTED REPEATED TO VERIFY CRITICAL RESULT CALLED TO, READ BACK BY AND VERIFIED WITH: CLARK,J RN [id number] COVINGTON,N    Chloride 109 101 - 111 mmol/L   CO2 21 (L) 22 - 32 mmol/L   Glucose, Bld 103 (H) 65 - 99 mg/dL   BUN 49 (H) 6 - 20 mg/dL   Creatinine, Ser 2.40 (H) 0.61 - 1.24 mg/dL   Calcium 8.4 (L) 8.9 - 10.3 mg/dL   GFR calc non Af Amer 29 (L) >60 mL/min   GFR calc Af Amer 33 (L) >60 mL/min    Comment: (NOTE) The eGFR has been calculated using the CKD EPI equation. This calculation has not been validated in all clinical situations. eGFR's persistently <60 mL/min signify possible Chronic Kidney Disease.    Anion gap 8 5 - 15  Magnesium     Status: None   Collection Time: 10/21/14  4:23  AM  Result Value Ref Range   Magnesium 2.0 1.7 - 2.4 mg/dL  Glucose, capillary     Status: None   Collection Time: 10/21/14  7:25 AM  Result Value Ref Range   Glucose-Capillary 95 65 - 99 mg/dL  Glucose, capillary     Status: Abnormal   Collection Time: 10/21/14 11:50 AM  Result Value Ref Range   Glucose-Capillary 122 (H) 65 - 99 mg/dL  Glucose, capillary     Status: Abnormal   Collection Time: 10/21/14  5:06 PM  Result Value Ref Range   Glucose-Capillary 156 (H) 65 - 99 mg/dL  Glucose, capillary     Status: Abnormal   Collection Time: 10/21/14  9:57 PM  Result Value Ref Range   Glucose-Capillary 106 (H) 65 - 99 mg/dL   Comment 1 Notify RN   Glucose, capillary     Status: Abnormal   Collection Time: 10/22/14  7:24 AM  Result Value Ref Range   Glucose-Capillary 100 (H) 65 - 99 mg/dL   Comment 1 Notify RN    Comment 2 Document in Chart   Basic metabolic panel     Status: Abnormal   Collection Time: 10/22/14  9:10 AM  Result Value Ref Range   Sodium 138 135 - 145 mmol/L   Potassium 2.9 (L) 3.5 - 5.1 mmol/L    Comment: DELTA CHECK NOTED REPEATED TO VERIFY NO VISIBLE HEMOLYSIS    Chloride 104 101 - 111 mmol/L   CO2 26 22 - 32 mmol/L   Glucose, Bld 122 (H) 65 - 99 mg/dL   BUN 34 (H) 6 - 20 mg/dL   Creatinine, Ser 2.20 (H) 0.61 - 1.24 mg/dL   Calcium 9.3 8.9 - 10.3 mg/dL   GFR calc non Af Amer 32 (L) >60 mL/min   GFR calc Af Amer 37 (L) >60 mL/min    Comment: (NOTE) The eGFR has been calculated using the CKD EPI equation. This calculation has not been validated in all clinical situations. eGFR's persistently <60 mL/min signify possible Chronic Kidney Disease.    Anion  gap 8 5 - 15  CBC     Status: Abnormal   Collection Time: 10/22/14  9:10 AM  Result Value Ref Range   WBC 6.8 4.0 - 10.5 K/uL   RBC 3.74 (L) 4.22 - 5.81 MIL/uL   Hemoglobin 10.7 (L) 13.0 - 17.0 g/dL   HCT 33.0 (L) 39.0 - 52.0 %   MCV 88.2 78.0 - 100.0 fL   MCH 28.6 26.0 - 34.0 pg   MCHC 32.4 30.0 -  36.0 g/dL   RDW 14.8 11.5 - 15.5 %   Platelets 282 150 - 400 K/uL  Magnesium     Status: None   Collection Time: 10/22/14  9:10 AM  Result Value Ref Range   Magnesium 2.2 1.7 - 2.4 mg/dL  Glucose, capillary     Status: None   Collection Time: 10/22/14 11:54 AM  Result Value Ref Range   Glucose-Capillary 87 65 - 99 mg/dL   Comment 1 Notify RN    Comment 2 Document in Chart     Vitals: Blood pressure 155/69, pulse 74, temperature 98.3 F (36.8 C), temperature source Oral, resp. rate 18, height $RemoveBe'5\' 10"'zWgKfQRGM$  (1.778 m), weight 79.107 kg (174 lb 6.4 oz), SpO2 100 %.  Risk to Self: Is patient at risk for suicide?: No Risk to Others:   Prior Inpatient Therapy:   Prior Outpatient Therapy:    Current Facility-Administered Medications  Medication Dose Route Frequency Provider Last Rate Last Dose  . acetaminophen (TYLENOL) tablet 650 mg  650 mg Oral Q4H PRN Ankit Nanavati, MD      . cefTRIAXone (ROCEPHIN) 1 g in dextrose 5 % 50 mL IVPB  1 g Intravenous Q24H Leann T Poindexter, RPH   1 g at 10/22/14 0650  . citalopram (CELEXA) tablet 40 mg  40 mg Oral Daily Robbie Lis, MD   40 mg at 10/22/14 0925  . heparin injection 5,000 Units  5,000 Units Subcutaneous 3 times per day Oswald Hillock, MD   5,000 Units at 10/22/14 1500  . insulin aspart (novoLOG) injection 0-9 Units  0-9 Units Subcutaneous TID WC Oswald Hillock, MD   2 Units at 10/21/14 1758  . LORazepam (ATIVAN) injection 1 mg  1 mg Intravenous TID PRN Robbie Lis, MD   1 mg at 10/21/14 1159  . ondansetron (ZOFRAN) tablet 4 mg  4 mg Oral Q6H PRN Oswald Hillock, MD       Or  . ondansetron (ZOFRAN) injection 4 mg  4 mg Intravenous Q6H PRN Oswald Hillock, MD      . potassium chloride SA (K-DUR,KLOR-CON) CR tablet 40 mEq  40 mEq Oral BID Robbie Lis, MD   40 mEq at 10/22/14 1150  . QUEtiapine (SEROQUEL) tablet 25 mg  25 mg Oral QHS Ambrose Finland, MD   25 mg at 10/21/14 2217  . sodium bicarbonate tablet 1,300 mg  1,300 mg Oral TID Rexene Agent, MD   1,300 mg at 10/22/14 8242    Musculoskeletal: Strength & Muscle Tone: decreased Gait & Station: normal Patient leans: N/A  Psychiatric Specialty Exam: Physical Exa:   ROS:   Blood pressure 155/69, pulse 74, temperature 98.3 F (36.8 C), temperature source Oral, resp. rate 18, height $RemoveBe'5\' 10"'WqvojmXEe$  (1.778 m), weight 79.107 kg (174 lb 6.4 oz), SpO2 100 %.Body mass index is 25.02 kg/(m^2).  General Appearance: Casual  Eye Contact::  Good  Speech:  Clear and Coherent  Volume:  Normal  Mood:  Euthymic  Affect:  Appropriate and Congruent  Thought Process:  Coherent and Goal Directed  Orientation:  Full (Time, Place, and Person)  Thought Content:  Rumination  Suicidal Thoughts:  No  Homicidal Thoughts:  No  Memory:  Immediate;   Good Recent;   Fair  Judgement:  Intact  Insight:  Fair  Psychomotor Activity:  Decreased  Concentration:  Good  Recall:  Good  Fund of Knowledge:Good  Language: Good  Akathisia:  Negative  Handed:  Right  AIMS (if indicated):     Assets:  Communication Skills Desire for Improvement Financial Resources/Insurance Housing Intimacy Leisure Time Resilience Social Support Talents/Skills Transportation  ADL's:  Intact  Cognition: Impaired,  Mild  Sleep:      Medical Decision Making: New problem, with additional work up planned, Review of Psycho-Social Stressors (1), Review or order clinical lab tests (1), Review of Last Therapy Session (1), Review or order medicine tests (1), Review of Medication Regimen & Side Effects (2) and Review of New Medication or Change in Dosage (2)  Treatment Plan Summary: Patient  has been positively responded with his medication management and has less symptoms of depression,  and anxiety anxiety. Patient was able to swallow, eat and drink since yesterday. Patient has no safety concerns. Patient is willing to participate in the case management. Daily contact with patient to assess and evaluate symptoms and progress in  treatment and Medication management  Plan:  Patient has capacity to make his own medical decisions and living arrangements based on my evaluation. Depression: Citalopram 40 mg daily morning for depression and anxiety Hallucinations:Continue  Seroquel 25 mg at bedtime Patient does not meet criteria for psychiatric inpatient admission. Supportive therapy provided about ongoing stressors.   Disposition:  Patient Is psychiatrically stable and has not required further medication changes at this time. Patient will be chest to outpatient medication management when medically stable.   [place] Rauf,JANARDHAHA R. 10/22/2014 4:28 PM

## 2014-10-23 DIAGNOSIS — N390 Urinary tract infection, site not specified: Secondary | ICD-10-CM | POA: Insufficient documentation

## 2014-10-23 DIAGNOSIS — D638 Anemia in other chronic diseases classified elsewhere: Secondary | ICD-10-CM | POA: Insufficient documentation

## 2014-10-23 DIAGNOSIS — B9620 Unspecified Escherichia coli [E. coli] as the cause of diseases classified elsewhere: Secondary | ICD-10-CM

## 2014-10-23 LAB — GLUCOSE, CAPILLARY
Glucose-Capillary: 141 mg/dL — ABNORMAL HIGH (ref 65–99)
Glucose-Capillary: 100 mg/dL — ABNORMAL HIGH (ref 65–99)

## 2014-10-23 LAB — BASIC METABOLIC PANEL
ANION GAP: 5 (ref 5–15)
BUN: 32 mg/dL — ABNORMAL HIGH (ref 6–20)
CHLORIDE: 112 mmol/L — AB (ref 101–111)
CO2: 27 mmol/L (ref 22–32)
CREATININE: 2.43 mg/dL — AB (ref 0.61–1.24)
Calcium: 9.4 mg/dL (ref 8.9–10.3)
GFR calc non Af Amer: 28 mL/min — ABNORMAL LOW (ref >60)
GFR, EST AFRICAN AMERICAN: 33 mL/min — AB (ref >60)
GLUCOSE: 137 mg/dL — AB (ref 65–99)
POTASSIUM: 3.6 mmol/L (ref 3.5–5.1)
SODIUM: 144 mmol/L (ref 135–145)

## 2014-10-23 LAB — URINE CULTURE: Culture: >=100000

## 2014-10-23 MED ORDER — QUETIAPINE FUMARATE 25 MG PO TABS: 25.0000 mg | ORAL_TABLET | Freq: Every day | ORAL | Status: DC

## 2014-10-23 MED ORDER — CIPROFLOXACIN HCL 500 MG PO TABS: 500.0000 mg | ORAL_TABLET | Freq: Two times a day (BID) | ORAL | Status: DC

## 2014-10-23 MED ORDER — SODIUM BICARBONATE 650 MG PO TABS: 1300.0000 mg | ORAL_TABLET | Freq: Three times a day (TID) | ORAL | Status: DC

## 2014-10-23 NOTE — Care Management Note (Signed)
Case Management Note  Patient Details  Name: [name] [name] MRN: [id number] Date of Birth: [birth year]  Subjective/Objective:                    Action/Plan:d/c home. Psych-otpt rehab.No further d/c needs or orders.   Expected Discharge Date:                  Expected Discharge Plan:  Home/Self Care  In-House Referral:     Discharge planning Services  CM Consult  Post Acute Care Choice:    Choice offered to:     DME Arranged:    DME Agency:     HH Arranged:    [place] Agency:     Status of Service:  Completed, signed off  Medicare Important Message Given:    Date Medicare IM Given:    Medicare IM give by:    Date Additional Medicare IM Given:    Additional Medicare Important Message give by:     If discussed at [place] of Stay Meetings, dates discussed:    Additional Comments:  Dessa Phi, RN 10/23/2014, 10:45 AM

## 2014-10-23 NOTE — Discharge Instructions (Signed)
Chronic Kidney Disease Chronic kidney disease occurs when the kidneys are damaged over a long period. The kidneys are two organs that lie on either side of the spine between the middle of the back and the front of the abdomen. The kidneys:   Remove wastes and extra water from the blood.   Produce important hormones. These help keep bones strong, regulate blood pressure, and help create red blood cells.   Balance the fluids and chemicals in the blood and tissues. A small amount of kidney damage may not cause problems, but a large amount of damage may make it difficult or impossible for the kidneys to work the way they should. If steps are not taken to slow down the kidney damage or stop it from getting worse, the kidneys may stop working permanently. Most of the time, chronic kidney disease does not go away. However, it can often be controlled, and those with the disease can usually live normal lives. CAUSES  The most common causes of chronic kidney disease are diabetes and high blood pressure (hypertension). Chronic kidney disease may also be caused by:   Diseases that cause the kidneys' filters to become inflamed.   Diseases that affect the immune system.   Genetic diseases.   Medicines that damage the kidneys, such as anti-inflammatory medicines.  Poisoning or exposure to toxic substances.   A reoccurring kidney or urinary infection.   A problem with urine flow. This may be caused by:   Cancer.   Kidney stones.   An enlarged prostate in males. SIGNS AND SYMPTOMS  Because the kidney damage in chronic kidney disease occurs slowly, symptoms develop slowly and may not be obvious until the kidney damage becomes severe. A person may have a kidney disease for years without showing any symptoms. Symptoms can include:   Swelling (edema) of the legs, ankles, or feet.   Tiredness (lethargy).   Nausea or vomiting.   Confusion.   Problems with urination, such as:    Decreased urine production.   Frequent urination, especially at night.   Frequent accidents in children who are potty trained.   Muscle twitches and cramps.   Shortness of breath.  Weakness.   Persistent itchiness.   Loss of appetite.  Metallic taste in the mouth.  Trouble sleeping.  Slowed development in children.  Short stature in children. DIAGNOSIS  Chronic kidney disease may be detected and diagnosed by tests, including blood, urine, imaging, or kidney biopsy tests.  TREATMENT  Most chronic kidney diseases cannot be cured. Treatment usually involves relieving symptoms and preventing or slowing the progression of the disease. Treatment may include:   A special diet. You may need to avoid alcohol and foods thatare salty and high in potassium.   Medicines. These may:   Lower blood pressure.   Relieve anemia.   Relieve swelling.   Protect the bones. HOME CARE INSTRUCTIONS   Follow your prescribed diet.   Take medicines only as directed by your health care provider. Do not take any new medicines (prescription, over-the-counter, or nutritional supplements) unless approved by your health care provider. Many medicines can worsen your kidney damage or need to have the dose adjusted.   Quit smoking if you smoke. Talk to your health care provider about a smoking cessation program.   Keep all follow-up visits as directed by your health care provider. SEEK IMMEDIATE MEDICAL CARE IF:  Your symptoms get worse or you develop new symptoms.   You develop symptoms of end-stage kidney disease. These  include:   Headaches.   Abnormally dark or light skin.   Numbness in the hands or feet.   Easy bruising.   Frequent hiccups.   Menstruation stops.   You have a fever.   You have decreased urine production.   You havepain or bleeding when urinating. MAKE SURE YOU:  Understand these instructions.  Will watch your condition.  Will  get help right away if you are not doing well or get worse. FOR MORE INFORMATION   American Association of Kidney Patients: BombTimer.gl  National Kidney Foundation: www.kidney.[place]: https://mathis.com/  [facility]: www.lifeoptions.org and www.kidneyschool.org Document Released: 12/30/2007 Document Revised: 08/06/2013 Document Reviewed: 11/19/2011 [facility] Patient Information 2015 [place], [place]. This information is not intended to replace advice given to you by your health care provider. Make sure you discuss any questions you have with your health care provider.  Altered Mental Status Altered mental status most often refers to an abnormal change in your responsiveness and awareness. It can affect your speech, thought, mobility, memory, attention span, or alertness. It can range from slight confusion to complete unresponsiveness (coma). Altered mental status can be a sign of a serious underlying medical condition. Rapid evaluation and medical treatment is necessary for patients having an altered mental status. CAUSES   Low blood sugar (hypoglycemia) or diabetes.  Severe loss of body fluids (dehydration) or a body salt (electrolyte) imbalance.  A stroke or other neurologic problem, such as dementia or delirium.  A head injury or tumor.  A drug or alcohol overdose.  Exposure to toxins or poisons.  Depression, anxiety, and stress.  A low oxygen level (hypoxia).  An infection.  Blood loss.  Twitching or shaking (seizure).  Heart problems, such as heart attack or heart rhythm problems (arrhythmias).  A body temperature that is too low or too high (hypothermia or hyperthermia). DIAGNOSIS  A diagnosis is based on your history, symptoms, physical and neurologic examinations, and diagnostic tests. Diagnostic tests may include:  Measurement of your blood pressure, pulse, breathing, and oxygen levels (vital signs).  Blood tests.  Urine  tests.  X-ray exams.  A computerized magnetic scan (magnetic resonance imaging, MRI).  A computerized X-ray scan (computed tomography, CT scan). TREATMENT  Treatment will depend on the cause. Treatment may include:  Management of an underlying medical or mental health condition.  Critical care or support in the hospital. [place]   Only take over-the-counter or prescription medicines for pain, discomfort, or fever as directed by your caregiver.  Manage underlying conditions as directed by your caregiver.  Eat a healthy, well-balanced diet to maintain strength.  Join a support group or prevention program to cope with the condition or trauma that caused the altered mental status. Ask your caregiver to help choose a program that works for you.  Follow up with your caregiver for further examination, therapy, or testing as directed. SEEK MEDICAL CARE IF:   You feel unwell or have chills.  You or your family notice a change in your behavior or your alertness.  You have trouble following your caregiver's treatment plan.  You have questions or concerns. SEEK IMMEDIATE MEDICAL CARE IF:   You have a rapid heartbeat or have chest pain.  You have difficulty breathing.  You have a fever.  You have a headache with a stiff neck.  You cough up blood.  You have blood in your urine or stool.  You have severe agitation or confusion. MAKE SURE YOU:  Understand these instructions.  Will watch your condition.  Will get help right away if you are not doing well or get worse. Document Released: 09/09/2009 Document Revised: 06/14/2011 Document Reviewed: 09/09/2009 [facility] Patient Information 2015 [place], [place]. This information is not intended to replace advice given to you by your health care provider. Make sure you discuss any questions you have with your health care provider.

## 2014-10-23 NOTE — Discharge Summary (Signed)
Physician Discharge Summary  [name] [name] [id number] DOB: Jul 19, 1960 DOA: 10/19/2014  PCP: Florina Ou, MD  Admit date: 10/19/2014 Discharge date: 10/23/2014  Recommendations for Outpatient Follow-up:  Take cipro for 3 more days on discharge starting 10/24/2014 Your creatinine is 2.4 and GFR 28 prior to discharge Follow-up with [facility] per scheduled appointment Please note that bicarbonate is now 3 times daily instead of once a day. Please note new medications Seroquel 25 mg at bedtime.  Discharge Diagnoses:  Principal Problem:   Altered mental status Active Problems:   AKI (acute kidney injury)   Bladder cancer   CKD (chronic kidney disease), stage IV   Diabetes mellitus type 2, diet-controlled   Acute encephalopathy    Discharge Condition: stable   Diet recommendation: as tolerated   History of present illness:  54 year old male with past medical history of bladder cancer, anxiety and depression, chronic kidney disease stage 4 with AV fistula in place in anticipation for eventual HD, diabetes (diet controlled) who presented to [facility] hospital with altered mental status and poor by mouth intake as reported by patient's wife at the time of the admission. On the admission, patient was hemodynamically stable. He was found to have severe metabolic acidosis with a CO2 of 9. His urine drug screen was within normal limits. His urinalysis revealed large leukocytes and many bacteria for which reason he was started on empiric Rocephin for urinary tract infection.  Hospital Course:   Assessment/Plan:    Principal problem: Acute encephalopathy - Unclear etiology however possibility includes urinary tract infection as well as depression - UDS WNL - Psych consulted and added Seroquel which pt tolerated well - Continue Celexa as per prior dose per home regimen  - CT head with no acute intracranial findings - Continue cipro for treatment of UTI for 3 more days  on discharge  Active problems: Chronic kidney disease, stage IV / acute on chronic kidney disease / metabolic acidosis - Nephrology is following, appreciate their input - Patient was initially on bicarbonate infusion. He was started on sodium bicarbonate 10/22/2014 1300 mg by mouth 3 times a day. - Creatinine remains stable in past 24-48 hours. - Patient has left AV fistula, per nephrology ready to use when needed  Anemia of chronic disease - Secondary to chronic kidney disease - Hemoglobin is stable  Acute urinary tract infection secondary to E.Coli and Klebsiella  - Large leukocytes and many bacteria seen on admission on urinalysis - Urine culture significant for Escherichia coli and Klebsiella sensitive to Rocephin as well as ciprofloxacin. Patient will continue ciprofloxacin for 3 more days on discharge. This was completed treatment for UTI for total of 7 days.  Hypokalemia - Likely from CKD, metabolic acidosis - Supplemented and within a normal limits  Questionable dysphagia vs aspiration - Had coughing after drinking fluids  - Speech therapy consulted, evaluation completed.  Diabetes mellitus, type 2 with episodes of hypoglycemia - Controlled   History of bladder cancer - Stable    DVT Prophylaxis  - Heparin subcutaneous ordered while pt in hospital    Code Status: Full.  Family Communication: plan of care discussed with the patient and his wife.   IV access:  Peripheral IV  Procedures and diagnostic studies:   Dg Chest 2 View 10/19/2014 No active cardiopulmonary disease.   Ct Head Wo Contrast 10/19/2014 No evidence of acute intracranial abnormality.   US Renal 10/19/2014 Mild right-sided hydronephrosis stable from the prior exam. No other focal abnormality is seen.  Medical Consultants:  Nephrology Psychiatry   Other Consultants:  None   IAnti-Infectives:   Rocephin 10/20/2014 --> 10/23/2014     Signed:  Leisa Lenz, MD  [facility] 10/23/2014, 10:23 AM  Pager #: [phone]  Time spent in minutes: more than 30 minutes   Discharge Exam: Filed Vitals:   10/22/14 2245  BP: 139/73  Pulse: 89  Temp: 98.4 F (36.9 C)  Resp: 18   Filed Vitals:   10/21/14 2055 10/22/14 0524 10/22/14 1240 10/22/14 2245  BP: 135/75 138/78 155/69 139/73  Pulse: 74 86 74 89  Temp: 98.4 F (36.9 C)  98.3 F (36.8 C) 98.4 F (36.9 C)  TempSrc: Oral Oral Oral Oral  Resp: 16 16 18 18   Height:      Weight:      SpO2: 100% 99% 100% 98%    General: Pt is alert, follows commands appropriately, not in acute distress Cardiovascular: Regular rate and rhythm, S1/S2 +, no murmurs Respiratory: Clear to auscultation bilaterally, no wheezing, no crackles, no rhonchi Abdominal: Soft, non tender, non distended, bowel sounds +, no guarding Extremities: no edema, no cyanosis, pulses palpable bilaterally DP and PT Neuro: Grossly nonfocal  Discharge Instructions  Discharge Instructions    Call MD for:  difficulty breathing, headache or visual disturbances    Complete by:  As directed      Call MD for:  persistant dizziness or light-headedness    Complete by:  As directed      Call MD for:  persistant nausea and vomiting    Complete by:  As directed      Call MD for:  severe uncontrolled pain    Complete by:  As directed      Diet - low sodium heart healthy    Complete by:  As directed      Discharge instructions    Complete by:  As directed   Take cipro for 3 more days on discharge starting 10/24/2014 Your creatinine is 2.4 and GFR 28 prior to discharge Follow-up with [facility] per scheduled appointment Please note that bicarbonate is now 3 times daily instead of once a day. Please note new medications Seroquel 25 mg at bedtime.     Increase activity slowly    Complete by:  As directed             Medication List    TAKE these medications        cetirizine 10 MG tablet  Commonly  known as:  ZYRTEC  Take 10 mg by mouth daily as needed for allergies.     ciprofloxacin 500 MG tablet  Commonly known as:  CIPRO  Take 1 tablet (500 mg total) by mouth 2 (two) times daily.  Start taking on:  10/24/2014     citalopram 40 MG tablet  Commonly known as:  CELEXA  Take 40 mg by mouth every morning.     epoetin alfa 20000 UNIT/ML injection  Commonly known as:  EPOGEN,PROCRIT  Inject 20,000 Units into the skin every 28 (twenty-eight) days. Every four weeks     feeding supplement (NEPRO CARB STEADY) Liqd  Take 237 mLs by mouth 3 (three) times daily between meals.     pantoprazole 40 MG tablet  Commonly known as:  PROTONIX  Take 80 mg by mouth daily.     QUEtiapine 25 MG tablet  Commonly known as:  SEROQUEL  Take 1 tablet (25 mg total) by mouth at bedtime.  sodium bicarbonate 650 MG tablet  Take 2 tablets (1,300 mg total) by mouth 3 (three) times daily.            Follow-up Information    Follow up with Florina Ou, MD. Schedule an appointment as soon as possible for a visit in 1 week.   Specialty:  Family Medicine   Why:  Follow up appt after recent hospitalization   Contact information:   [address] [place] [address] [phone]        The results of significant diagnostics from this hospitalization (including imaging, microbiology, ancillary and laboratory) are listed below for reference.    Significant Diagnostic Studies: Dg Chest 2 View  10/19/2014   CLINICAL DATA:  54 year old male with delirium  EXAM: CHEST  2 VIEW  COMPARISON:  Chest radiograph dated 10/30/2013  FINDINGS: The heart size and mediastinal contours are within normal limits. Both lungs are clear. The visualized skeletal structures are unremarkable.  IMPRESSION: No active cardiopulmonary disease.   Electronically Signed   By: Anner Crete M.D.   On: 10/19/2014 17:09   Ct Head Wo Contrast  10/19/2014   CLINICAL DATA:  Altered mental status   EXAM: CT HEAD WITHOUT CONTRAST  TECHNIQUE: Contiguous axial images were obtained from the base of the skull through the vertex without intravenous contrast.  COMPARISON:  07/05/2014  FINDINGS: No evidence of parenchymal hemorrhage or extra-axial fluid collection. No mass lesion, mass effect, or midline shift.  No CT evidence of acute infarction.  Cerebral volume is within normal limits.  No ventriculomegaly.  The visualized paranasal sinuses are essentially clear. The mastoid air cells are unopacified.  No evidence of calvarial fracture.  IMPRESSION: No evidence of acute intracranial abnormality.   Electronically Signed   By: Julian Hy M.D.   On: 10/19/2014 17:05   US Renal  10/19/2014   CLINICAL DATA:  Acute renal failure  EXAM: RENAL / URINARY TRACT ULTRASOUND COMPLETE  COMPARISON:  01/11/2014  FINDINGS: Right Kidney:  Length: 11.4 cm. Mild hydronephrosis is again seen and stable from the prior exam. This may be the patient's current steady state  Left Kidney:  Length: 11.6 cm. Echogenicity within normal limits. No mass or hydronephrosis visualized.  Bladder:  Neobladder is noted.  IMPRESSION: Mild right-sided hydronephrosis stable from the prior exam. No other focal abnormality is seen.   Electronically Signed   By: Inez Catalina M.D.   On: 10/19/2014 18:10    Microbiology: Recent Results (from the past 240 hour(s))  MRSA PCR Screening     Status: None   Collection Time: 10/19/14  8:30 PM  Result Value Ref Range Status   MRSA by PCR NEGATIVE NEGATIVE Final  Urine culture     Status: None   Collection Time: 10/19/14  8:45 PM  Result Value Ref Range Status   Specimen Description URINE, RANDOM  Final   Special Requests NONE  Final   Culture   Final    >=100,000 COLONIES/mL ESCHERICHIA COLI >=100,000 COLONIES/mL KLEBSIELLA PNEUMONIAE Performed at [facility]    Report Status 10/23/2014 FINAL  Final   Organism ID, Bacteria ESCHERICHIA COLI  Final   Organism ID, Bacteria  KLEBSIELLA PNEUMONIAE  Final      Susceptibility   Escherichia coli - MIC*    AMPICILLIN 4 SENSITIVE Sensitive     CEFAZOLIN <=4 SENSITIVE Sensitive     CEFTRIAXONE <=1 SENSITIVE Sensitive     CIPROFLOXACIN <=0.25 SENSITIVE Sensitive  GENTAMICIN <=1 SENSITIVE Sensitive     IMIPENEM <=0.25 SENSITIVE Sensitive     NITROFURANTOIN <=16 SENSITIVE Sensitive     TRIMETH/SULFA <=20 SENSITIVE Sensitive     AMPICILLIN/SULBACTAM <=2 SENSITIVE Sensitive     PIP/TAZO <=4 SENSITIVE Sensitive     * >=100,000 COLONIES/mL ESCHERICHIA COLI   Klebsiella pneumoniae - MIC*    AMPICILLIN >=32 RESISTANT Resistant     CEFAZOLIN <=4 SENSITIVE Sensitive     CEFTRIAXONE <=1 SENSITIVE Sensitive     CIPROFLOXACIN <=0.25 SENSITIVE Sensitive     GENTAMICIN <=1 SENSITIVE Sensitive     IMIPENEM <=0.25 SENSITIVE Sensitive     NITROFURANTOIN 32 SENSITIVE Sensitive     TRIMETH/SULFA <=20 SENSITIVE Sensitive     AMPICILLIN/SULBACTAM 4 SENSITIVE Sensitive     PIP/TAZO <=4 SENSITIVE Sensitive     * >=100,000 COLONIES/mL KLEBSIELLA PNEUMONIAE  Culture, blood (routine x 2)     Status: None (Preliminary result)   Collection Time: 10/20/14  2:57 PM  Result Value Ref Range Status   Specimen Description BLOOD RIGHT ARM  Final   Special Requests   Final    BOTTLES DRAWN AEROBIC AND ANAEROBIC 10CC BOTH BOTTLES   Culture   Final    NO GROWTH 2 DAYS Performed at [facility]    Report Status PENDING  Incomplete  Culture, blood (routine x 2)     Status: None (Preliminary result)   Collection Time: 10/20/14  3:05 PM  Result Value Ref Range Status   Specimen Description BLOOD RIGHT HAND  Final   Special Requests   Final    BOTTLES DRAWN AEROBIC AND ANAEROBIC 10CC BOTH BOTTLES   Culture   Final    NO GROWTH 2 DAYS Performed at [facility]    Report Status PENDING  Incomplete     Labs: Basic Metabolic Panel:  Recent Labs Lab 10/19/14 1401 10/20/14 0408 10/21/14 0423 10/22/14 0910 10/23/14 0415   NA 137 140  140 138 138 144  K 3.5 2.9*  2.9* 2.4* 2.9* 3.6  CL 119* 120*  119* 109 104 112*  CO2 9* 14*  15* 21* 26 27  GLUCOSE 123* 78  82 103* 122* 137*  BUN 71* 65*  64* 49* 34* 32*  CREATININE 3.49* 2.89*  2.86* 2.40* 2.20* 2.43*  CALCIUM 9.7 8.7*  8.9 8.4* 9.3 9.4  MG  --   --  2.0 2.2  --   PHOS  --  3.9  3.8  --   --   --    Liver Function Tests:  Recent Labs Lab 10/19/14 1401 10/20/14 0408  AST 15 14*  ALT 17 14*  ALKPHOS 59 49  BILITOT 0.7 0.4  PROT 7.8 6.4*  ALBUMIN 4.1 3.4*  3.4*   No results for input(s): LIPASE, AMYLASE in the last 168 hours. No results for input(s): AMMONIA in the last 168 hours. CBC:  Recent Labs Lab 10/19/14 1401 10/20/14 0408 10/21/14 0423 10/22/14 0910  WBC 10.3 8.2 8.5 6.8  NEUTROABS 8.3*  --   --   --   HGB 10.6* 9.8* 10.0* 10.7*  HCT 33.2* 30.2* 29.8* 33.0*  MCV 88.5 88.6 86.1 88.2  PLT 269 230 253 282   Cardiac Enzymes:  Recent Labs Lab 10/19/14 1401 10/20/14 0408  CKTOTAL 76 71   BNP: BNP (last 3 results) No results for input(s): BNP in the last 8760 hours.  ProBNP (last 3 results)  Recent Labs  10/30/13 1919  PROBNP 96.4  CBG:  Recent Labs Lab 10/22/14 1154 10/22/14 1713 10/22/14 2206 10/23/14 0414 10/23/14 0711  GLUCAP 87 87 99 141* 100*

## 2014-10-23 NOTE — Progress Notes (Signed)
Pt discharged home with spouse in stable condition. Discharge instructions and scripts given. Pt verbalized understanding 

## 2014-10-25 LAB — CULTURE, BLOOD (ROUTINE X 2)
CULTURE: NO GROWTH
CULTURE: NO GROWTH

## 2014-11-19 ENCOUNTER — Encounter (HOSPITAL_COMMUNITY)
Admission: RE | Admit: 2014-11-19 | Discharge: 2014-11-19 | Disposition: A | Discharge status: Home / Self Care | Payer: BC Managed Care – PPO | Source: Ambulatory Visit | Attending: Nephrology | Admitting: Nephrology

## 2014-11-19 DIAGNOSIS — D638 Anemia in other chronic diseases classified elsewhere: Secondary | ICD-10-CM | POA: Diagnosis not present

## 2014-11-19 DIAGNOSIS — N189 Chronic kidney disease, unspecified: Secondary | ICD-10-CM | POA: Insufficient documentation

## 2014-11-19 LAB — RENAL FUNCTION PANEL
ALBUMIN: 3.7 g/dL (ref 3.5–5.0)
ANION GAP: 9 (ref 5–15)
BUN: 50 mg/dL — AB (ref 6–20)
CO2: 21 mmol/L — AB (ref 22–32)
Calcium: 9.4 mg/dL (ref 8.9–10.3)
Chloride: 104 mmol/L (ref 101–111)
Creatinine, Ser: 2.8 mg/dL — ABNORMAL HIGH (ref 0.61–1.24)
GFR calc Af Amer: 27 mL/min — ABNORMAL LOW (ref >60)
GFR calc non Af Amer: 24 mL/min — ABNORMAL LOW (ref >60)
GLUCOSE: 182 mg/dL — AB (ref 65–99)
PHOSPHORUS: 3.8 mg/dL (ref 2.5–4.6)
Potassium: 4.3 mmol/L (ref 3.5–5.1)
SODIUM: 134 mmol/L — AB (ref 135–145)

## 2014-11-19 LAB — IRON AND TIBC
Iron: 31 ug/dL — ABNORMAL LOW (ref 45–182)
SATURATION RATIOS: 15 % — AB (ref 17.9–39.5)
TIBC: 218 ug/dL — AB (ref 250–450)
UIBC: 186 ug/dL

## 2014-11-19 LAB — POCT HEMOGLOBIN-HEMACUE: HEMOGLOBIN: 10.3 g/dL — AB (ref 13.0–17.0)

## 2014-11-19 LAB — FERRITIN: Ferritin: 902 ng/mL — ABNORMAL HIGH (ref 24–336)

## 2014-11-19 MED ORDER — EPOETIN ALFA 20000 UNIT/ML IJ SOLN
INTRAMUSCULAR | Status: AC
  Filled 2014-11-19: qty 1

## 2014-11-19 MED ORDER — EPOETIN ALFA 20000 UNIT/ML IJ SOLN
20000.0000 [IU] | INTRAMUSCULAR | Status: DC
  Administered 2014-11-19: 20000 [IU] via SUBCUTANEOUS

## 2014-11-20 LAB — PTH, INTACT AND CALCIUM
Calcium, Total (PTH): 8.9 mg/dL (ref 8.7–10.2)
PTH: 42 pg/mL (ref 15–65)

## 2014-12-10 ENCOUNTER — Other Ambulatory Visit (HOSPITAL_COMMUNITY): Payer: Self-pay | Admitting: *Deleted

## 2014-12-11 ENCOUNTER — Encounter (HOSPITAL_COMMUNITY)
Admission: RE | Admit: 2014-12-11 | Discharge: 2014-12-11 | Disposition: A | Discharge status: Home / Self Care | Payer: BC Managed Care – PPO | Source: Ambulatory Visit | Attending: Nephrology | Admitting: Nephrology

## 2014-12-11 DIAGNOSIS — N189 Chronic kidney disease, unspecified: Secondary | ICD-10-CM | POA: Diagnosis not present

## 2014-12-11 DIAGNOSIS — D638 Anemia in other chronic diseases classified elsewhere: Secondary | ICD-10-CM | POA: Diagnosis not present

## 2014-12-11 MED ORDER — FERUMOXYTOL INJECTION 510 MG/17 ML
510.0000 mg | INTRAVENOUS | Status: DC
  Administered 2014-12-11: 510 mg via INTRAVENOUS
  Filled 2014-12-11: qty 17

## 2014-12-16 ENCOUNTER — Other Ambulatory Visit (HOSPITAL_COMMUNITY): Payer: Self-pay | Admitting: *Deleted

## 2014-12-17 ENCOUNTER — Encounter (HOSPITAL_COMMUNITY): Payer: Self-pay

## 2014-12-17 ENCOUNTER — Encounter (HOSPITAL_COMMUNITY)
Admission: RE | Admit: 2014-12-17 | Discharge: 2014-12-17 | Disposition: A | Discharge status: Home / Self Care | Payer: BC Managed Care – PPO | Source: Ambulatory Visit | Attending: Nephrology | Admitting: Nephrology

## 2014-12-17 DIAGNOSIS — D638 Anemia in other chronic diseases classified elsewhere: Secondary | ICD-10-CM | POA: Diagnosis not present

## 2014-12-17 LAB — RENAL FUNCTION PANEL
Albumin: 3.7 g/dL (ref 3.5–5.0)
Anion gap: 7 (ref 5–15)
BUN: 59 mg/dL — ABNORMAL HIGH (ref 6–20)
CO2: 19 mmol/L — ABNORMAL LOW (ref 22–32)
CREATININE: 2.63 mg/dL — AB (ref 0.61–1.24)
Calcium: 9.7 mg/dL (ref 8.9–10.3)
Chloride: 111 mmol/L (ref 101–111)
GFR calc Af Amer: 30 mL/min — ABNORMAL LOW (ref >60)
GFR, EST NON AFRICAN AMERICAN: 26 mL/min — AB (ref >60)
Glucose, Bld: 99 mg/dL (ref 65–99)
PHOSPHORUS: 4.2 mg/dL (ref 2.5–4.6)
Potassium: 3.9 mmol/L (ref 3.5–5.1)
Sodium: 137 mmol/L (ref 135–145)

## 2014-12-17 LAB — IRON AND TIBC
Iron: 80 ug/dL (ref 45–182)
Saturation Ratios: 35 % (ref 17.9–39.5)
TIBC: 231 ug/dL — ABNORMAL LOW (ref 250–450)
UIBC: 151 ug/dL

## 2014-12-17 LAB — FERRITIN: FERRITIN: 795 ng/mL — AB (ref 24–336)

## 2014-12-17 LAB — POCT HEMOGLOBIN-HEMACUE: Hemoglobin: 10.6 g/dL — ABNORMAL LOW (ref 13.0–17.0)

## 2014-12-17 MED ORDER — SODIUM CHLORIDE 0.9 % IV SOLN
510.0000 mg | INTRAVENOUS | Status: DC
  Administered 2014-12-17: 510 mg via INTRAVENOUS
  Filled 2014-12-17: qty 17

## 2014-12-17 MED ORDER — EPOETIN ALFA 20000 UNIT/ML IJ SOLN
INTRAMUSCULAR | Status: AC
  Filled 2014-12-17: qty 1

## 2014-12-17 MED ORDER — EPOETIN ALFA 20000 UNIT/ML IJ SOLN
20000.0000 [IU] | INTRAMUSCULAR | Status: DC
  Administered 2014-12-17: 20000 [IU] via SUBCUTANEOUS

## 2014-12-17 NOTE — Discharge Instructions (Signed)

## 2014-12-18 DIAGNOSIS — D638 Anemia in other chronic diseases classified elsewhere: Secondary | ICD-10-CM | POA: Diagnosis not present

## 2014-12-18 LAB — PTH, INTACT AND CALCIUM
Calcium, Total (PTH): 9.4 mg/dL (ref 8.7–10.2)
PTH: 23 pg/mL (ref 15–65)

## 2015-01-14 ENCOUNTER — Encounter (HOSPITAL_COMMUNITY)
Admission: RE | Admit: 2015-01-14 | Discharge: 2015-01-14 | Disposition: A | Discharge status: Home / Self Care | Payer: BC Managed Care – PPO | Source: Ambulatory Visit | Attending: Nephrology | Admitting: Nephrology

## 2015-01-14 DIAGNOSIS — N189 Chronic kidney disease, unspecified: Secondary | ICD-10-CM | POA: Diagnosis not present

## 2015-01-14 DIAGNOSIS — D638 Anemia in other chronic diseases classified elsewhere: Secondary | ICD-10-CM | POA: Diagnosis present

## 2015-01-14 LAB — FERRITIN: Ferritin: 852 ng/mL — ABNORMAL HIGH (ref 24–336)

## 2015-01-14 LAB — RENAL FUNCTION PANEL
ALBUMIN: 3.6 g/dL (ref 3.5–5.0)
Anion gap: 8 (ref 5–15)
BUN: 65 mg/dL — AB (ref 6–20)
CALCIUM: 9.7 mg/dL (ref 8.9–10.3)
CO2: 21 mmol/L — ABNORMAL LOW (ref 22–32)
CREATININE: 2.72 mg/dL — AB (ref 0.61–1.24)
Chloride: 105 mmol/L (ref 101–111)
GFR calc Af Amer: 28 mL/min — ABNORMAL LOW (ref >60)
GFR, EST NON AFRICAN AMERICAN: 25 mL/min — AB (ref >60)
GLUCOSE: 215 mg/dL — AB (ref 65–99)
PHOSPHORUS: 3.5 mg/dL (ref 2.5–4.6)
POTASSIUM: 4.6 mmol/L (ref 3.5–5.1)
SODIUM: 134 mmol/L — AB (ref 135–145)

## 2015-01-14 LAB — IRON AND TIBC
Iron: 61 ug/dL (ref 45–182)
SATURATION RATIOS: 28 % (ref 17.9–39.5)
TIBC: 220 ug/dL — ABNORMAL LOW (ref 250–450)
UIBC: 159 ug/dL

## 2015-01-14 LAB — POCT HEMOGLOBIN-HEMACUE: HEMOGLOBIN: 11.1 g/dL — AB (ref 13.0–17.0)

## 2015-01-14 MED ORDER — EPOETIN ALFA 20000 UNIT/ML IJ SOLN
INTRAMUSCULAR | Status: AC
  Filled 2015-01-14: qty 1

## 2015-01-14 MED ORDER — EPOETIN ALFA 20000 UNIT/ML IJ SOLN
20000.0000 [IU] | INTRAMUSCULAR | Status: DC
  Administered 2015-01-14: 20000 [IU] via SUBCUTANEOUS

## 2015-01-15 LAB — PTH, INTACT AND CALCIUM
CALCIUM TOTAL (PTH): 9.9 mg/dL (ref 8.7–10.2)
PTH: 20 pg/mL (ref 15–65)

## 2015-02-11 ENCOUNTER — Inpatient Hospital Stay (HOSPITAL_COMMUNITY): Admission: RE | Admit: 2015-02-11 | Payer: Self-pay | Source: Ambulatory Visit

## 2015-02-11 ENCOUNTER — Encounter (HOSPITAL_COMMUNITY)
Admission: RE | Admit: 2015-02-11 | Discharge: 2015-02-11 | Disposition: A | Discharge status: Home / Self Care | Payer: BC Managed Care – PPO | Source: Ambulatory Visit | Attending: Nephrology | Admitting: Nephrology

## 2015-02-11 DIAGNOSIS — D638 Anemia in other chronic diseases classified elsewhere: Secondary | ICD-10-CM | POA: Diagnosis not present

## 2015-02-11 DIAGNOSIS — N189 Chronic kidney disease, unspecified: Secondary | ICD-10-CM | POA: Diagnosis not present

## 2015-02-11 LAB — IRON AND TIBC
Iron: 90 ug/dL (ref 45–182)
Saturation Ratios: 39 % (ref 17.9–39.5)
TIBC: 232 ug/dL — ABNORMAL LOW (ref 250–450)
UIBC: 142 ug/dL

## 2015-02-11 LAB — RENAL FUNCTION PANEL
ALBUMIN: 3.8 g/dL (ref 3.5–5.0)
Anion gap: 7 (ref 5–15)
BUN: 52 mg/dL — AB (ref 6–20)
CO2: 21 mmol/L — ABNORMAL LOW (ref 22–32)
Calcium: 9.7 mg/dL (ref 8.9–10.3)
Chloride: 107 mmol/L (ref 101–111)
Creatinine, Ser: 2.72 mg/dL — ABNORMAL HIGH (ref 0.61–1.24)
GFR, EST AFRICAN AMERICAN: 28 mL/min — AB (ref >60)
GFR, EST NON AFRICAN AMERICAN: 25 mL/min — AB (ref >60)
Glucose, Bld: 133 mg/dL — ABNORMAL HIGH (ref 65–99)
PHOSPHORUS: 3.9 mg/dL (ref 2.5–4.6)
POTASSIUM: 4.1 mmol/L (ref 3.5–5.1)
Sodium: 135 mmol/L (ref 135–145)

## 2015-02-11 LAB — FERRITIN: Ferritin: 784 ng/mL — ABNORMAL HIGH (ref 24–336)

## 2015-02-11 LAB — POCT HEMOGLOBIN-HEMACUE: HEMOGLOBIN: 12.4 g/dL — AB (ref 13.0–17.0)

## 2015-02-11 MED ORDER — EPOETIN ALFA 20000 UNIT/ML IJ SOLN: 20000.0000 [IU] | INTRAMUSCULAR | Status: DC

## 2015-02-12 LAB — PTH, INTACT AND CALCIUM
CALCIUM TOTAL (PTH): 9.5 mg/dL (ref 8.7–10.2)
PTH: 27 pg/mL (ref 15–65)

## 2015-02-24 ENCOUNTER — Encounter (HOSPITAL_COMMUNITY)
Admission: RE | Admit: 2015-02-24 | Discharge: 2015-02-24 | Disposition: A | Discharge status: Home / Self Care | Payer: BC Managed Care – PPO | Source: Ambulatory Visit | Attending: Nephrology | Admitting: Nephrology

## 2015-02-24 DIAGNOSIS — D638 Anemia in other chronic diseases classified elsewhere: Secondary | ICD-10-CM | POA: Diagnosis not present

## 2015-02-24 LAB — POCT HEMOGLOBIN-HEMACUE: HEMOGLOBIN: 11.2 g/dL — AB (ref 13.0–17.0)

## 2015-02-24 MED ORDER — EPOETIN ALFA 20000 UNIT/ML IJ SOLN
INTRAMUSCULAR | Status: AC
  Filled 2015-02-24: qty 1

## 2015-02-24 MED ORDER — EPOETIN ALFA 20000 UNIT/ML IJ SOLN
20000.0000 [IU] | INTRAMUSCULAR | Status: DC
  Administered 2015-02-24: 20000 [IU] via SUBCUTANEOUS

## 2015-02-25 ENCOUNTER — Encounter (HOSPITAL_COMMUNITY): Payer: Self-pay

## 2015-03-24 ENCOUNTER — Encounter (HOSPITAL_COMMUNITY): Payer: Self-pay

## 2015-04-04 ENCOUNTER — Other Ambulatory Visit (HOSPITAL_COMMUNITY): Payer: Self-pay | Admitting: *Deleted

## 2015-04-08 ENCOUNTER — Encounter (HOSPITAL_COMMUNITY)
Admission: RE | Admit: 2015-04-08 | Discharge: 2015-04-08 | Disposition: A | Discharge status: Home / Self Care | Payer: BC Managed Care – PPO | Source: Ambulatory Visit | Attending: Nephrology | Admitting: Nephrology

## 2015-04-08 DIAGNOSIS — N189 Chronic kidney disease, unspecified: Secondary | ICD-10-CM | POA: Diagnosis not present

## 2015-04-08 DIAGNOSIS — D638 Anemia in other chronic diseases classified elsewhere: Secondary | ICD-10-CM | POA: Diagnosis present

## 2015-04-08 LAB — RENAL FUNCTION PANEL
ANION GAP: 6 (ref 5–15)
Albumin: 3.5 g/dL (ref 3.5–5.0)
BUN: 53 mg/dL — AB (ref 6–20)
CHLORIDE: 111 mmol/L (ref 101–111)
CO2: 20 mmol/L — ABNORMAL LOW (ref 22–32)
Calcium: 8.9 mg/dL (ref 8.9–10.3)
Creatinine, Ser: 3.01 mg/dL — ABNORMAL HIGH (ref 0.61–1.24)
GFR calc Af Amer: 25 mL/min — ABNORMAL LOW (ref >60)
GFR calc non Af Amer: 22 mL/min — ABNORMAL LOW (ref >60)
GLUCOSE: 183 mg/dL — AB (ref 65–99)
PHOSPHORUS: 3.6 mg/dL (ref 2.5–4.6)
POTASSIUM: 3.6 mmol/L (ref 3.5–5.1)
Sodium: 137 mmol/L (ref 135–145)

## 2015-04-08 LAB — IRON AND TIBC
IRON: 73 ug/dL (ref 45–182)
SATURATION RATIOS: 33 % (ref 17.9–39.5)
TIBC: 218 ug/dL — AB (ref 250–450)
UIBC: 145 ug/dL

## 2015-04-08 LAB — POCT HEMOGLOBIN-HEMACUE: Hemoglobin: 10.8 g/dL — ABNORMAL LOW (ref 13.0–17.0)

## 2015-04-08 LAB — FERRITIN: Ferritin: 551 ng/mL — ABNORMAL HIGH (ref 24–336)

## 2015-04-08 MED ORDER — EPOETIN ALFA 20000 UNIT/ML IJ SOLN
INTRAMUSCULAR | Status: AC
  Administered 2015-04-08: 20000 [IU] via SUBCUTANEOUS
  Filled 2015-04-08: qty 1

## 2015-04-08 MED ORDER — EPOETIN ALFA 20000 UNIT/ML IJ SOLN
20000.0000 [IU] | INTRAMUSCULAR | Status: DC
  Administered 2015-04-08: 20000 [IU] via SUBCUTANEOUS

## 2015-04-09 LAB — PTH, INTACT AND CALCIUM
CALCIUM TOTAL (PTH): 9 mg/dL (ref 8.7–10.2)
PTH: 51 pg/mL (ref 15–65)

## 2015-04-25 ENCOUNTER — Other Ambulatory Visit: Payer: Self-pay | Admitting: Nephrology

## 2015-04-25 DIAGNOSIS — N189 Chronic kidney disease, unspecified: Secondary | ICD-10-CM

## 2015-04-30 ENCOUNTER — Other Ambulatory Visit: Payer: Self-pay

## 2015-05-20 ENCOUNTER — Encounter (HOSPITAL_COMMUNITY)
Admission: RE | Admit: 2015-05-20 | Discharge: 2015-05-20 | Disposition: A | Discharge status: Home / Self Care | Payer: BC Managed Care – PPO | Source: Ambulatory Visit | Attending: Nephrology | Admitting: Nephrology

## 2015-05-20 DIAGNOSIS — D638 Anemia in other chronic diseases classified elsewhere: Secondary | ICD-10-CM | POA: Insufficient documentation

## 2015-05-20 DIAGNOSIS — N189 Chronic kidney disease, unspecified: Secondary | ICD-10-CM | POA: Insufficient documentation

## 2015-05-20 LAB — RENAL FUNCTION PANEL
ANION GAP: 7 (ref 5–15)
Albumin: 3.3 g/dL — ABNORMAL LOW (ref 3.5–5.0)
BUN: 45 mg/dL — ABNORMAL HIGH (ref 6–20)
CALCIUM: 9.2 mg/dL (ref 8.9–10.3)
CO2: 17 mmol/L — ABNORMAL LOW (ref 22–32)
CREATININE: 3.02 mg/dL — AB (ref 0.61–1.24)
Chloride: 115 mmol/L — ABNORMAL HIGH (ref 101–111)
GFR, EST AFRICAN AMERICAN: 25 mL/min — AB (ref >60)
GFR, EST NON AFRICAN AMERICAN: 22 mL/min — AB (ref >60)
Glucose, Bld: 138 mg/dL — ABNORMAL HIGH (ref 65–99)
Phosphorus: 3.7 mg/dL (ref 2.5–4.6)
Potassium: 3.7 mmol/L (ref 3.5–5.1)
SODIUM: 139 mmol/L (ref 135–145)

## 2015-05-20 LAB — FERRITIN: FERRITIN: 543 ng/mL — AB (ref 24–336)

## 2015-05-20 LAB — IRON AND TIBC
Iron: 88 ug/dL (ref 45–182)
SATURATION RATIOS: 41 % — AB (ref 17.9–39.5)
TIBC: 217 ug/dL — ABNORMAL LOW (ref 250–450)
UIBC: 129 ug/dL

## 2015-05-20 LAB — POCT HEMOGLOBIN-HEMACUE: HEMOGLOBIN: 11.7 g/dL — AB (ref 13.0–17.0)

## 2015-05-20 MED ORDER — EPOETIN ALFA 20000 UNIT/ML IJ SOLN
20000.0000 [IU] | INTRAMUSCULAR | Status: DC
  Administered 2015-05-20: 20000 [IU] via SUBCUTANEOUS

## 2015-05-20 MED ORDER — EPOETIN ALFA 20000 UNIT/ML IJ SOLN
INTRAMUSCULAR | Status: AC
  Filled 2015-05-20: qty 1

## 2015-05-21 ENCOUNTER — Ambulatory Visit
Admission: RE | Admit: 2015-05-21 | Discharge: 2015-05-21 | Disposition: A | Discharge status: Home / Self Care | Payer: BC Managed Care – PPO | Source: Ambulatory Visit | Attending: Nephrology | Admitting: Nephrology

## 2015-05-21 DIAGNOSIS — N189 Chronic kidney disease, unspecified: Secondary | ICD-10-CM

## 2015-05-21 LAB — PTH, INTACT AND CALCIUM
Calcium, Total (PTH): 8.8 mg/dL (ref 8.7–10.2)
PTH: 34 pg/mL (ref 15–65)

## 2015-05-22 ENCOUNTER — Other Ambulatory Visit (HOSPITAL_COMMUNITY): Payer: Self-pay | Admitting: Urology

## 2015-05-22 DIAGNOSIS — D090 Carcinoma in situ of bladder: Secondary | ICD-10-CM

## 2015-05-31 ENCOUNTER — Ambulatory Visit (INDEPENDENT_AMBULATORY_CARE_PROVIDER_SITE_OTHER): Payer: BC Managed Care – PPO | Admitting: Physician Assistant

## 2015-05-31 VITALS — BP 140/60 | HR 98 | Temp 97.7°F | Resp 20 | Ht 70.0 in | Wt 198.4 lb

## 2015-05-31 DIAGNOSIS — S0180XA Unspecified open wound of other part of head, initial encounter: Secondary | ICD-10-CM

## 2015-05-31 DIAGNOSIS — Z23 Encounter for immunization: Secondary | ICD-10-CM | POA: Diagnosis not present
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
## 2015-05-31 NOTE — Patient Instructions (Signed)
WOUND CARE Please return in 5-7 days to have your stitches/staples removed or sooner if you have concerns. Marland Kitchen Keep area clean and dry for 24 hours. Do not remove bandage, if applied. . After 24 hours, remove bandage and wash wound gently with mild soap and warm water. Reapply a new bandage after cleaning wound, if directed. . Continue daily cleansing with soap and water until stitches/staples are removed. . Do not apply any ointments or creams to the wound while stitches/staples are in place, as this may cause delayed healing. . Notify the office if you experience any of the following signs of infection: Swelling, redness, pus drainage, streaking, fever >101.0 F . Notify the office if you experience excessive bleeding that does not stop after 15-20 minutes of constant, firm pressure.

## 2015-06-11 ENCOUNTER — Other Ambulatory Visit (HOSPITAL_COMMUNITY): Payer: Self-pay | Admitting: Urology

## 2015-06-11 ENCOUNTER — Ambulatory Visit (HOSPITAL_COMMUNITY)
Admission: RE | Admit: 2015-06-11 | Discharge: 2015-06-11 | Disposition: A | Discharge status: Home / Self Care | Payer: BC Managed Care – PPO | Source: Ambulatory Visit | Attending: Urology | Admitting: Urology

## 2015-06-11 DIAGNOSIS — Z9889 Other specified postprocedural states: Secondary | ICD-10-CM | POA: Insufficient documentation

## 2015-06-11 DIAGNOSIS — D090 Carcinoma in situ of bladder: Secondary | ICD-10-CM

## 2015-06-18 ENCOUNTER — Other Ambulatory Visit (HOSPITAL_COMMUNITY): Payer: Self-pay | Admitting: Urology

## 2015-06-18 ENCOUNTER — Ambulatory Visit (HOSPITAL_COMMUNITY)
Admission: RE | Admit: 2015-06-18 | Discharge: 2015-06-18 | Disposition: A | Discharge status: Home / Self Care | Payer: BC Managed Care – PPO | Source: Ambulatory Visit | Attending: Urology | Admitting: Urology

## 2015-06-18 DIAGNOSIS — D090 Carcinoma in situ of bladder: Secondary | ICD-10-CM

## 2015-07-01 ENCOUNTER — Encounter (HOSPITAL_COMMUNITY): Payer: Self-pay

## 2015-07-28 ENCOUNTER — Encounter (HOSPITAL_COMMUNITY)
Admission: RE | Admit: 2015-07-28 | Discharge: 2015-07-28 | Disposition: A | Discharge status: Home / Self Care | Payer: BC Managed Care – PPO | Source: Ambulatory Visit | Attending: Nephrology | Admitting: Nephrology

## 2015-07-28 DIAGNOSIS — D638 Anemia in other chronic diseases classified elsewhere: Secondary | ICD-10-CM | POA: Diagnosis not present

## 2015-07-28 DIAGNOSIS — N189 Chronic kidney disease, unspecified: Secondary | ICD-10-CM | POA: Insufficient documentation

## 2015-07-28 LAB — IRON AND TIBC
Iron: 41 ug/dL — ABNORMAL LOW (ref 45–182)
Saturation Ratios: 22 % (ref 17.9–39.5)
TIBC: 190 ug/dL — ABNORMAL LOW (ref 250–450)
UIBC: 149 ug/dL

## 2015-07-28 LAB — RENAL FUNCTION PANEL
ALBUMIN: 3.5 g/dL (ref 3.5–5.0)
ANION GAP: 8 (ref 5–15)
BUN: 37 mg/dL — ABNORMAL HIGH (ref 6–20)
CALCIUM: 9 mg/dL (ref 8.9–10.3)
CO2: 16 mmol/L — ABNORMAL LOW (ref 22–32)
Chloride: 114 mmol/L — ABNORMAL HIGH (ref 101–111)
Creatinine, Ser: 3.13 mg/dL — ABNORMAL HIGH (ref 0.61–1.24)
GFR, EST AFRICAN AMERICAN: 24 mL/min — AB (ref >60)
GFR, EST NON AFRICAN AMERICAN: 21 mL/min — AB (ref >60)
GLUCOSE: 194 mg/dL — AB (ref 65–99)
PHOSPHORUS: 2.6 mg/dL (ref 2.5–4.6)
POTASSIUM: 3.6 mmol/L (ref 3.5–5.1)
SODIUM: 138 mmol/L (ref 135–145)

## 2015-07-28 LAB — FERRITIN: Ferritin: 683 ng/mL — ABNORMAL HIGH (ref 24–336)

## 2015-07-28 MED ORDER — EPOETIN ALFA 20000 UNIT/ML IJ SOLN
INTRAMUSCULAR | Status: AC
  Administered 2015-07-28: 20000 [IU] via SUBCUTANEOUS
  Filled 2015-07-28: qty 1

## 2015-07-28 MED ORDER — EPOETIN ALFA 20000 UNIT/ML IJ SOLN: 20000.0000 [IU] | INTRAMUSCULAR | Status: DC

## 2015-07-29 LAB — PTH, INTACT AND CALCIUM
CALCIUM TOTAL (PTH): 8.7 mg/dL (ref 8.7–10.2)
PTH: 33 pg/mL (ref 15–65)

## 2015-07-29 LAB — POCT HEMOGLOBIN-HEMACUE: HEMOGLOBIN: 11.7 g/dL — AB (ref 13.0–17.0)

## 2015-09-05 ENCOUNTER — Other Ambulatory Visit (HOSPITAL_COMMUNITY): Payer: Self-pay | Admitting: *Deleted

## 2015-09-08 ENCOUNTER — Encounter (HOSPITAL_COMMUNITY): Payer: Self-pay

## 2015-09-15 ENCOUNTER — Encounter (HOSPITAL_COMMUNITY): Payer: Self-pay

## 2015-09-29 ENCOUNTER — Encounter (HOSPITAL_COMMUNITY)
Admission: RE | Admit: 2015-09-29 | Discharge: 2015-09-29 | Disposition: A | Discharge status: Home / Self Care | Payer: BC Managed Care – PPO | Source: Ambulatory Visit | Attending: Nephrology | Admitting: Nephrology

## 2015-09-29 DIAGNOSIS — N189 Chronic kidney disease, unspecified: Secondary | ICD-10-CM | POA: Diagnosis not present

## 2015-09-29 DIAGNOSIS — D638 Anemia in other chronic diseases classified elsewhere: Secondary | ICD-10-CM | POA: Diagnosis not present

## 2015-09-29 LAB — IRON AND TIBC
IRON: 69 ug/dL (ref 45–182)
Saturation Ratios: 31 % (ref 17.9–39.5)
TIBC: 221 ug/dL — AB (ref 250–450)
UIBC: 152 ug/dL

## 2015-09-29 LAB — POCT HEMOGLOBIN-HEMACUE: Hemoglobin: 11.2 g/dL — ABNORMAL LOW (ref 13.0–17.0)

## 2015-09-29 LAB — RENAL FUNCTION PANEL
ANION GAP: 7 (ref 5–15)
Albumin: 3.7 g/dL (ref 3.5–5.0)
BUN: 35 mg/dL — ABNORMAL HIGH (ref 6–20)
CHLORIDE: 109 mmol/L (ref 101–111)
CO2: 20 mmol/L — AB (ref 22–32)
Calcium: 9.2 mg/dL (ref 8.9–10.3)
Creatinine, Ser: 2.99 mg/dL — ABNORMAL HIGH (ref 0.61–1.24)
GFR calc non Af Amer: 22 mL/min — ABNORMAL LOW (ref >60)
GFR, EST AFRICAN AMERICAN: 25 mL/min — AB (ref >60)
Glucose, Bld: 163 mg/dL — ABNORMAL HIGH (ref 65–99)
Phosphorus: 3.1 mg/dL (ref 2.5–4.6)
Potassium: 3.8 mmol/L (ref 3.5–5.1)
Sodium: 136 mmol/L (ref 135–145)

## 2015-09-29 LAB — FERRITIN: Ferritin: 710 ng/mL — ABNORMAL HIGH (ref 24–336)

## 2015-09-29 MED ORDER — EPOETIN ALFA 20000 UNIT/ML IJ SOLN
20000.0000 [IU] | INTRAMUSCULAR | Status: DC
  Administered 2015-09-29: 20000 [IU] via SUBCUTANEOUS

## 2015-09-29 MED ORDER — EPOETIN ALFA 20000 UNIT/ML IJ SOLN
INTRAMUSCULAR | Status: AC
  Filled 2015-09-29: qty 1

## 2015-09-30 LAB — PTH, INTACT AND CALCIUM
CALCIUM TOTAL (PTH): 8.9 mg/dL (ref 8.7–10.2)
PTH: 76 pg/mL — ABNORMAL HIGH (ref 15–65)

## 2015-10-20 ENCOUNTER — Inpatient Hospital Stay (HOSPITAL_COMMUNITY): Payer: BC Managed Care – PPO

## 2015-10-20 ENCOUNTER — Encounter (HOSPITAL_COMMUNITY): Payer: Self-pay | Admitting: Emergency Medicine

## 2015-10-20 ENCOUNTER — Inpatient Hospital Stay (HOSPITAL_COMMUNITY)
Admission: EM | Admit: 2015-10-20 | Discharge: 2015-10-21 | DRG: 690 | Disposition: A | Discharge status: Home / Self Care | Payer: BC Managed Care – PPO | Attending: Internal Medicine | Admitting: Internal Medicine

## 2015-10-20 ENCOUNTER — Emergency Department (HOSPITAL_COMMUNITY): Payer: BC Managed Care – PPO

## 2015-10-20 DIAGNOSIS — Z79899 Other long term (current) drug therapy: Secondary | ICD-10-CM | POA: Diagnosis not present

## 2015-10-20 DIAGNOSIS — E1122 Type 2 diabetes mellitus with diabetic chronic kidney disease: Secondary | ICD-10-CM | POA: Diagnosis present

## 2015-10-20 DIAGNOSIS — N1330 Unspecified hydronephrosis: Secondary | ICD-10-CM | POA: Diagnosis present

## 2015-10-20 DIAGNOSIS — E872 Acidosis: Secondary | ICD-10-CM | POA: Diagnosis present

## 2015-10-20 DIAGNOSIS — Z539 Procedure and treatment not carried out, unspecified reason: Secondary | ICD-10-CM | POA: Diagnosis present

## 2015-10-20 DIAGNOSIS — R000 Tachycardia, unspecified: Secondary | ICD-10-CM | POA: Diagnosis present

## 2015-10-20 DIAGNOSIS — M109 Gout, unspecified: Secondary | ICD-10-CM | POA: Diagnosis present

## 2015-10-20 DIAGNOSIS — N1339 Other hydronephrosis: Secondary | ICD-10-CM

## 2015-10-20 DIAGNOSIS — Z8551 Personal history of malignant neoplasm of bladder: Secondary | ICD-10-CM | POA: Diagnosis not present

## 2015-10-20 DIAGNOSIS — K219 Gastro-esophageal reflux disease without esophagitis: Secondary | ICD-10-CM | POA: Diagnosis present

## 2015-10-20 DIAGNOSIS — Z87891 Personal history of nicotine dependence: Secondary | ICD-10-CM | POA: Diagnosis not present

## 2015-10-20 DIAGNOSIS — Z8249 Family history of ischemic heart disease and other diseases of the circulatory system: Secondary | ICD-10-CM

## 2015-10-20 DIAGNOSIS — F419 Anxiety disorder, unspecified: Secondary | ICD-10-CM | POA: Diagnosis present

## 2015-10-20 DIAGNOSIS — N136 Pyonephrosis: Principal | ICD-10-CM | POA: Diagnosis present

## 2015-10-20 DIAGNOSIS — N12 Tubulo-interstitial nephritis, not specified as acute or chronic: Secondary | ICD-10-CM | POA: Diagnosis not present

## 2015-10-20 DIAGNOSIS — Z833 Family history of diabetes mellitus: Secondary | ICD-10-CM

## 2015-10-20 DIAGNOSIS — I120 Hypertensive chronic kidney disease with stage 5 chronic kidney disease or end stage renal disease: Secondary | ICD-10-CM | POA: Diagnosis present

## 2015-10-20 DIAGNOSIS — Z888 Allergy status to other drugs, medicaments and biological substances status: Secondary | ICD-10-CM

## 2015-10-20 DIAGNOSIS — E119 Type 2 diabetes mellitus without complications: Secondary | ICD-10-CM

## 2015-10-20 DIAGNOSIS — N390 Urinary tract infection, site not specified: Secondary | ICD-10-CM

## 2015-10-20 DIAGNOSIS — N186 End stage renal disease: Secondary | ICD-10-CM | POA: Diagnosis present

## 2015-10-20 DIAGNOSIS — B999 Unspecified infectious disease: Secondary | ICD-10-CM

## 2015-10-20 DIAGNOSIS — F329 Major depressive disorder, single episode, unspecified: Secondary | ICD-10-CM | POA: Diagnosis present

## 2015-10-20 DIAGNOSIS — R1031 Right lower quadrant pain: Secondary | ICD-10-CM | POA: Diagnosis present

## 2015-10-20 DIAGNOSIS — N200 Calculus of kidney: Secondary | ICD-10-CM

## 2015-10-20 LAB — URINE MICROSCOPIC-ADD ON

## 2015-10-20 LAB — URINALYSIS, ROUTINE W REFLEX MICROSCOPIC
BILIRUBIN URINE: NEGATIVE
GLUCOSE, UA: NEGATIVE mg/dL
Ketones, ur: NEGATIVE mg/dL
Nitrite: POSITIVE — AB
PROTEIN: 30 mg/dL — AB
SPECIFIC GRAVITY, URINE: 1.009 (ref 1.005–1.030)
pH: 8 (ref 5.0–8.0)

## 2015-10-20 LAB — PROTIME-INR
INR: 1.17 (ref 0.00–1.49)
Prothrombin Time: 14.6 seconds (ref 11.6–15.2)

## 2015-10-20 LAB — CBC WITH DIFFERENTIAL/PLATELET
BASOS ABS: 0 10*3/uL (ref 0.0–0.1)
Basophils Relative: 0 %
EOS ABS: 0 10*3/uL (ref 0.0–0.7)
EOS PCT: 0 %
HCT: 34 % — ABNORMAL LOW (ref 39.0–52.0)
Hemoglobin: 11.1 g/dL — ABNORMAL LOW (ref 13.0–17.0)
LYMPHS ABS: 0.9 10*3/uL (ref 0.7–4.0)
Lymphocytes Relative: 6 %
MCH: 29.4 pg (ref 26.0–34.0)
MCHC: 32.6 g/dL (ref 30.0–36.0)
MCV: 89.9 fL (ref 78.0–100.0)
MONO ABS: 1.1 10*3/uL — AB (ref 0.1–1.0)
MONOS PCT: 7 %
NEUTROS ABS: 13.6 10*3/uL — AB (ref 1.7–7.7)
Neutrophils Relative %: 87 %
PLATELETS: 257 10*3/uL (ref 150–400)
RBC: 3.78 MIL/uL — AB (ref 4.22–5.81)
RDW: 14.1 % (ref 11.5–15.5)
WBC: 15.6 10*3/uL — ABNORMAL HIGH (ref 4.0–10.5)

## 2015-10-20 LAB — BASIC METABOLIC PANEL
ANION GAP: 7 (ref 5–15)
BUN: 48 mg/dL — ABNORMAL HIGH (ref 6–20)
CALCIUM: 9 mg/dL (ref 8.9–10.3)
CO2: 18 mmol/L — ABNORMAL LOW (ref 22–32)
Chloride: 110 mmol/L (ref 101–111)
Creatinine, Ser: 3.09 mg/dL — ABNORMAL HIGH (ref 0.61–1.24)
GFR calc Af Amer: 24 mL/min — ABNORMAL LOW (ref >60)
GFR, EST NON AFRICAN AMERICAN: 21 mL/min — AB (ref >60)
Glucose, Bld: 227 mg/dL — ABNORMAL HIGH (ref 65–99)
POTASSIUM: 3.9 mmol/L (ref 3.5–5.1)
SODIUM: 135 mmol/L (ref 135–145)

## 2015-10-20 LAB — GLUCOSE, CAPILLARY
GLUCOSE-CAPILLARY: 164 mg/dL — AB (ref 65–99)
Glucose-Capillary: 113 mg/dL — ABNORMAL HIGH (ref 65–99)
Glucose-Capillary: 139 mg/dL — ABNORMAL HIGH (ref 65–99)

## 2015-10-20 MED ORDER — INSULIN ASPART 100 UNIT/ML ~~LOC~~ SOLN
0.0000 [IU] | Freq: Three times a day (TID) | SUBCUTANEOUS | Status: DC
  Administered 2015-10-21: 1 [IU] via SUBCUTANEOUS

## 2015-10-20 MED ORDER — ONDANSETRON HCL 4 MG/2ML IJ SOLN
4.0000 mg | Freq: Once | INTRAMUSCULAR | Status: AC
  Administered 2015-10-20: 4 mg via INTRAVENOUS
  Filled 2015-10-20: qty 2

## 2015-10-20 MED ORDER — INSULIN ASPART 100 UNIT/ML ~~LOC~~ SOLN: 0.0000 [IU] | SUBCUTANEOUS | Status: DC

## 2015-10-20 MED ORDER — DEXTROSE 5 % IV SOLN
1.0000 g | Freq: Once | INTRAVENOUS | Status: AC
  Administered 2015-10-20: 1 g via INTRAVENOUS
  Filled 2015-10-20: qty 10

## 2015-10-20 MED ORDER — LACTATED RINGERS IV SOLN
INTRAVENOUS | Status: DC
  Administered 2015-10-20: 12:00:00 via INTRAVENOUS

## 2015-10-20 MED ORDER — MORPHINE SULFATE (PF) 4 MG/ML IV SOLN
4.0000 mg | Freq: Once | INTRAVENOUS | Status: AC
  Administered 2015-10-20: 4 mg via INTRAVENOUS
  Filled 2015-10-20: qty 1

## 2015-10-20 MED ORDER — HYDROMORPHONE HCL 1 MG/ML IJ SOLN: 1.0000 mg | INTRAMUSCULAR | Status: DC | PRN

## 2015-10-20 MED ORDER — HYDROMORPHONE HCL 1 MG/ML IJ SOLN
1.0000 mg | Freq: Once | INTRAMUSCULAR | Status: AC
  Administered 2015-10-20: 1 mg via INTRAVENOUS
  Filled 2015-10-20: qty 1

## 2015-10-20 MED ORDER — SODIUM CHLORIDE 0.9 % IV BOLUS (SEPSIS)
1000.0000 mL | Freq: Once | INTRAVENOUS | Status: AC
  Administered 2015-10-20: 1000 mL via INTRAVENOUS

## 2015-10-20 MED ORDER — DEXTROSE 5 % IV SOLN
1.0000 g | INTRAVENOUS | Status: DC
  Administered 2015-10-21: 1 g via INTRAVENOUS
  Filled 2015-10-20: qty 10

## 2015-10-20 NOTE — Consult Note (Signed)
Chief Complaint: right hydronephrosis secondary to UVJ obstruction  Referring Physician:Dr. Thayer Jew  Primary urologist: Dr. Raynelle Bring  Nephrologist: Dr. Corliss Parish  Supervising Physician: Sandi Mariscal  Patient Status: In-pt   HPI: [name] [name] is an 55 y.o. male with a history of urothelial cell carcinoma who is ultimately s/p bladder resection and internal ileal conduit reconstruction.  This was done in [place], [place] in 2015.  While there, after surgery, he developed a right sided hydronephrosis and had to have a right PCN placed.  This was in for several months and then was able to be removed.  He does have chronic kidney disease.  He has a fistula, but has not progressed to HD needs at this time.  He is still followed by Dr. Alinda [name] in regards to his urologic maintenance and follow up.  Last night around midnight, he began having right flank pain along with nausea and vomiting.  His wife states he has laid around and slept most of the day.  He was brought to the ED where he was found to have moderate right hydroureteronephrosis with multiple subtle punctate calcific densities layering within the distal right ureter to the level of the neo right UVJ, suspicious for small obstructive stones.  He also had gas in the left renal collecting system concerning for superimposed infection.  We have been asked to see the patient for evaluation of a right PCN.  Past Medical History:  Past Medical History  Diagnosis Date  . Gout     recent flair -bilateral feet--  STABLE  . Frequency of urination   . Urgency incontinence   . Nocturia   . Diabetes mellitus type 2, diet-controlled ([place]) PT STOPPED TAKING METFORMIN--  HAS BEEN WATCHING DIET, EXERCISING AND LOSING WT  . Bladder cancer ([place]) 2013  . Complication of anesthesia     agitation w/awakening in 9/13,OK 01/24/12  . Hypertension   . Anxiety   . Depression   . GERD (gastroesophageal reflux disease)   . Acute renal  injury ([place]) 11/2013  . Anemia   . Kidney failure     Past Surgical History:  Past Surgical History  Procedure Laterality Date  . Ulner artery repair Right 2009    rt -trauma /thrombectomy,repair  . Transurethral resection of bladder tumor  12/15/2011    Procedure: TRANSURETHRAL RESECTION OF BLADDER TUMOR (TURBT);  Surgeon: Molli Hazard, MD;  Location: [facility];  Service: Urology;  Laterality: N/A;  2 HRS   . Lumbar laminectomy  06-02-2005    W/ RESECTION NERVE ROOT  L4  -  L5  . Transurethral resection of bladder tumor  01/24/2012    Procedure: TRANSURETHRAL RESECTION OF BLADDER TUMOR (TURBT);  Surgeon: Molli Hazard, MD;  Location: [facility];  Service: Urology;  Laterality: N/A;  90 MIN   . Cystoscopy with urethral dilatation  01/24/2012    Procedure: CYSTOSCOPY WITH URETHRAL DILATATION;  Surgeon: Molli Hazard, MD;  Location: [facility];  Service: Urology;  Laterality: N/A;  . Transurethral resection of bladder tumor N/A 07/10/2012    Procedure: TRANSURETHRAL RESECTION OF BLADDER TUMOR (TURBT);  Surgeon: Molli Hazard, MD;  Location: [facility];  Service: Urology;  Laterality: N/A;  . Cystoscopy w/ retrogrades Bilateral 07/10/2012    Procedure: CYSTOSCOPY WITH RETROGRADE PYELOGRAM;  Surgeon: Molli Hazard, MD;  Location: [facility];  Service: Urology;  Laterality: Bilateral;  . Back surgery    .  Bladder surgery  04/26/2013    at the [facility] [place] in  [place]    . Av fistula placement Left 05/31/2014    Procedure: LEFT RADIOCEPHALIC ARTERIOVENOUS (AV) FISTULA CREATION ;  Surgeon: Mal Misty, MD;  Location: [facility];  Service: Vascular;  Laterality: Left;    Family History:  Family History  Problem Relation Age of Onset  . Diabetes Mellitus II Other   . Hypertension Other   . Bladder Cancer Neg Hx     Social History:  reports that he quit  smoking about 23 years ago. His smoking use included Cigarettes. He has a 16 pack-year smoking history. He quit smokeless tobacco use about 23 years ago. His smokeless tobacco use included Chew. He reports that he does not drink alcohol or use illicit drugs.  Allergies:  Allergies  Allergen Reactions  . Lisinopril Cough    Medications:   Medication List    ASK your doctor about these medications        citalopram 40 MG tablet  Commonly known as:  CELEXA  Take 40 mg by mouth every morning.     pantoprazole 40 MG tablet  Commonly known as:  PROTONIX  Take 40 mg by mouth 2 (two) times daily.     sodium bicarbonate 650 MG tablet  Take 2 tablets (1,300 mg total) by mouth 3 (three) times daily.        Please HPI for pertinent positives, otherwise complete 10 system ROS negative.  Mallampati Score: MD Evaluation Airway: WNL Heart: WNL Abdomen: WNL Chest/ Lungs: WNL ASA  Classification: 2 Mallampati/Airway Score: Two  Physical Exam: BP 129/59 mmHg  Pulse 109  Temp(Src) 98.5 F (36.9 C) (Oral)  Resp 16  Ht '5\' 10"'$  (1.778 m)  Wt 197 lb (89.359 kg)  BMI 28.27 kg/m2  SpO2 98% Body mass index is 28.27 kg/(m^2). General: pleasant, WD, WN white male who is laying in bed in NAD HEENT: head is normocephalic, atraumatic.  Sclera are noninjected.  PERRL.  Ears and nose without any masses or lesions.  Mouth is pink and moist Heart: regular, rate, and rhythm.  Normal s1,s2. No obvious murmurs, gallops, or rubs noted.  Palpable radial and pedal pulses bilaterally Lungs: CTAB, no wheezes, rhonchi, or rales noted.  Respiratory effort nonlabored Abd: soft, NT, ND, +BS, no masses, hernias, or organomegaly, mild right CVA tenderness MS: all 4 extremities are symmetrical with no cyanosis, clubbing, or edema. Psych: A&Ox3 with an appropriate affect.   Labs: Results for orders placed or performed during the hospital encounter of 10/20/15 (from the past 48 hour(s))  CBC with Differential      Status: Abnormal   Collection Time: 10/20/15  4:43 AM  Result Value Ref Range   WBC 15.6 (H) 4.0 - 10.5 K/uL   RBC 3.78 (L) 4.22 - 5.81 MIL/uL   Hemoglobin 11.1 (L) 13.0 - 17.0 g/dL   HCT 34.0 (L) 39.0 - 52.0 %   MCV 89.9 78.0 - 100.0 fL   MCH 29.4 26.0 - 34.0 pg   MCHC 32.6 30.0 - 36.0 g/dL   RDW 14.1 11.5 - 15.5 %   Platelets 257 150 - 400 K/uL   Neutrophils Relative % 87 %   Lymphocytes Relative 6 %   Monocytes Relative 7 %   Eosinophils Relative 0 %   Basophils Relative 0 %   Neutro Abs 13.6 (H) 1.7 - 7.7 K/uL   Lymphs Abs 0.9 0.7 - 4.0 K/uL  Monocytes Absolute 1.1 (H) 0.1 - 1.0 K/uL   Eosinophils Absolute 0.0 0.0 - 0.7 K/uL   Basophils Absolute 0.0 0.0 - 0.1 K/uL   Smear Review MORPHOLOGY UNREMARKABLE   Basic metabolic panel     Status: Abnormal   Collection Time: 10/20/15  4:43 AM  Result Value Ref Range   Sodium 135 135 - 145 mmol/L   Potassium 3.9 3.5 - 5.1 mmol/L   Chloride 110 101 - 111 mmol/L   CO2 18 (L) 22 - 32 mmol/L   Glucose, Bld 227 (H) 65 - 99 mg/dL   BUN 48 (H) 6 - 20 mg/dL   Creatinine, Ser 3.09 (H) 0.61 - 1.24 mg/dL   Calcium 9.0 8.9 - 10.3 mg/dL   GFR calc non Af Amer 21 (L) >60 mL/min   GFR calc Af Amer 24 (L) >60 mL/min    Comment: (NOTE) The eGFR has been calculated using the CKD EPI equation. This calculation has not been validated in all clinical situations. eGFR's persistently <60 mL/min signify possible Chronic Kidney Disease.    Anion gap 7 5 - 15  Urinalysis, Routine w reflex microscopic- may I&O cath if menses     Status: Abnormal   Collection Time: 10/20/15  4:49 AM  Result Value Ref Range   Color, Urine YELLOW YELLOW   APPearance CLOUDY (A) CLEAR   Specific Gravity, Urine 1.009 1.005 - 1.030   pH 8.0 5.0 - 8.0   Glucose, UA NEGATIVE NEGATIVE mg/dL   Hgb urine dipstick LARGE (A) NEGATIVE   Bilirubin Urine NEGATIVE NEGATIVE   Ketones, ur NEGATIVE NEGATIVE mg/dL   Protein, ur 30 (A) NEGATIVE mg/dL   Nitrite POSITIVE (A)  NEGATIVE   Leukocytes, UA LARGE (A) NEGATIVE  Urine microscopic-add on     Status: Abnormal   Collection Time: 10/20/15  4:49 AM  Result Value Ref Range   Squamous Epithelial / LPF 0-5 (A) NONE SEEN   WBC, UA TOO NUMEROUS TO COUNT 0 - 5 WBC/hpf   RBC / HPF TOO NUMEROUS TO COUNT 0 - 5 RBC/hpf   Bacteria, UA MANY (A) NONE SEEN    Imaging: Ct Renal Stone Study  10/20/2015  CLINICAL DATA:  Initial evaluation for acute right flank pain, nausea, vomiting. Leukocytosis, WBCs in urine. EXAM: CT ABDOMEN AND PELVIS WITHOUT CONTRAST TECHNIQUE: Multidetector CT imaging of the abdomen and pelvis was performed following the standard protocol without IV contrast. COMPARISON:  Prior MRI from 06/11/2015 as well as CT from 7/20 8/8 FINDINGS: Mild scattered atelectatic changes seen dependently within the visualized lung bases, right greater than left. Visualized lung bases are otherwise clear. No pleural or pericardial fusion. Limited noncontrast evaluation of the liver is unremarkable. Gallbladder within normal limits. No biliary dilatation. Spleen, adrenal glands, and pancreas demonstrate a normal unenhanced appearance. Stomach within normal limits. No evidence for bowel obstruction. No acute inflammatory changes about the bowels. Appendix normal. Right kidney drains into the neobladder positioned and the pelvis. There is moderate right hydroureteronephrosis with prominent right perinephric fat stranding. Multiple punctate calcific densities present within the distal right ureter, suspicious for small obstructive stones. Largest of these measures approximately 3 mm and is positioned at the neo right UVJ (series 5, image 78). Superimposed stricture could also be contributing. Scattered foci of gas present within the anterior portion of the neobladder. Left kidney drains via a left-sided ileal conduit. There is mild to moderate left hydronephrosis and left hydroureter. More mild left-sided perinephric fat stranding.  Scattered foci of  gas present within the left kidney. Single punctate 3 mm nonobstructive left renal calculus noted within the interpolar left kidney. No obstructive calculi seen on the left. Prostate not visualize, and is likely surgically absent. No free intraperitoneal air. No free fluid. Shotty subcentimeter periaortic and aortocaval lymph nodes noted, similar to previous. No pathologically enlarged intra-abdominal or pelvic lymph nodes. No acute osseous abnormality. Anterior wedge compression deformity of the T11 vertebral body is stable. Chronic 1 cm spondylolisthesis of L4 on L5 also unchanged. No worrisome lytic or blastic osseous lesions. IMPRESSION: 1. Moderate right hydroureteronephrosis with multiple subtle punctate calcific densities layering within the distal right ureter to the level of the neo right UVJ, suspicious for small obstructive stones. 2. Prominent right perinephric fat stranding. While this finding may in part be related to the underlying obstructive process, possible superimposed infection could be considered given the provided history. 3. Mild left hydronephrosis with no obstructive radiopaque calculi identified. Prominent inflammatory stranding about the left renal pelvis, also suspicious for possible superimposed infection. 4. Gas within the left renal collecting system and neobladder. While this finding may be related to the left-sided ileal conduit, superimposed infection could also be considered. Electronically Signed   By: Jeannine Boga M.D.   On: 10/20/2015 07:45    Assessment/Plan 1. (R) hydronephrosis -we will plan on placement of a right PCN drain today to relieve this obstruction and help alleviate some of the infectious process as well. -he is already on Rocephin for this process.  -cont NPO -hold any blood thinners -check PT/INR, normal -patient does have CKD and discussed with the patient that we do at time have to give contrast for this procedure and that  they may cause worsening renal failure.  He understands and verbally expresses that along with his wife. -Risks and Benefits discussed with the patient including, but not limited to infection, bleeding, significant bleeding causing loss or decrease in renal function or damage to adjacent structures.  All of the patient's questions were answered, patient is agreeable to proceed. Consent signed and in chart.   Thank you for this interesting consult.  I greatly enjoyed meeting [name] [name] and look forward to participating in their care.  A copy of this report was sent to the requesting provider on this date.  Electronically Signed: Henreitta Cea 10/20/2015, 9:44 AM   I spent a total of 40 Minutes    in face to face in clinical consultation, greater than 50% of which was counseling/coordinating care for right hydronephrosis

## 2015-10-20 NOTE — ED Notes (Signed)
Patient moved upstairs to a room

## 2015-10-20 NOTE — H&P (Signed)
History and Physical  [name] [name] [id number] DOB: [birth year] DOA: 10/20/2015  Referring physician: EDP PCP: Florina Ou, MD   Chief Complaint: abdominal pain, right flank pain  HPI: [name] [name] is a 55 y.o. male   H/o gout, diet controlled diabetes, ESRD with AV fistula, not on dialysis, bladder cancer with neobladder presented to the ED due to abdominal pain and right flank pain, emesis,   ED course: tmax 100.1, tachycardia (hr 102), but otherwise nontoxic appearing, wbc 15.6, cr 3.09 (possibly close to baseline), glucose 227, UA+ many bacteria, large leuk, +nitrite, CT stone study + moderate right hydroureteronephrosis? distal right ureter stone, right perinephric fat stranding. He is given analgesics,antiemetics and rocephin. EDP contacted urology who recommend IR for right nephrostomy tube placement.  Review of Systems:  Detail per HPI, Review of systems are otherwise negative  Past Medical History  Diagnosis Date  . Gout     recent flair -bilateral feet--  STABLE  . Frequency of urination   . Urgency incontinence   . Nocturia   . Diabetes mellitus type 2, diet-controlled ([place]) PT STOPPED TAKING METFORMIN--  HAS BEEN WATCHING DIET, EXERCISING AND LOSING WT  . Bladder cancer ([place]) 2013  . Complication of anesthesia     agitation w/awakening in 9/13,OK 01/24/12  . Hypertension   . Anxiety   . Depression   . GERD (gastroesophageal reflux disease)   . Acute renal injury ([place]) 11/2013  . Anemia   . Kidney failure    Past Surgical History  Procedure Laterality Date  . Ulner artery repair Right 2009    rt -trauma /thrombectomy,repair  . Transurethral resection of bladder tumor  12/15/2011    Procedure: TRANSURETHRAL RESECTION OF BLADDER TUMOR (TURBT);  Surgeon: Molli Hazard, MD;  Location: [facility];  Service: Urology;  Laterality: N/A;  2 HRS   . Lumbar laminectomy  06-02-2005    W/ RESECTION NERVE ROOT  L4  -  L5  . Transurethral  resection of bladder tumor  01/24/2012    Procedure: TRANSURETHRAL RESECTION OF BLADDER TUMOR (TURBT);  Surgeon: Molli Hazard, MD;  Location: [facility];  Service: Urology;  Laterality: N/A;  90 MIN   . Cystoscopy with urethral dilatation  01/24/2012    Procedure: CYSTOSCOPY WITH URETHRAL DILATATION;  Surgeon: Molli Hazard, MD;  Location: [facility];  Service: Urology;  Laterality: N/A;  . Transurethral resection of bladder tumor N/A 07/10/2012    Procedure: TRANSURETHRAL RESECTION OF BLADDER TUMOR (TURBT);  Surgeon: Molli Hazard, MD;  Location: [facility];  Service: Urology;  Laterality: N/A;  . Cystoscopy w/ retrogrades Bilateral 07/10/2012    Procedure: CYSTOSCOPY WITH RETROGRADE PYELOGRAM;  Surgeon: Molli Hazard, MD;  Location: [facility];  Service: Urology;  Laterality: Bilateral;  . Back surgery    . Bladder surgery  04/26/2013    at the cancer treatment [facility] [place] in  [place]    . Av fistula placement Left 05/31/2014    Procedure: LEFT RADIOCEPHALIC ARTERIOVENOUS (AV) FISTULA CREATION ;  Surgeon: Mal Misty, MD;  Location: [facility];  Service: Vascular;  Laterality: Left;   Social History:  reports that he quit smoking about 23 years ago. His smoking use included Cigarettes. He has a 16 pack-year smoking history. He quit smokeless tobacco use about 23 years ago. His smokeless tobacco use included Chew. He reports that he does not drink alcohol or use illicit drugs. Patient lives  at home & is able to participate in activities of daily living independently   Allergies  Allergen Reactions  . Lisinopril Cough    Family History  Problem Relation Age of Onset  . Diabetes Mellitus II Other   . Hypertension Other   . Bladder Cancer Neg Hx       Prior to Admission medications   Medication Sig Start Date End Date Taking? Authorizing Provider  citalopram (CELEXA) 40 MG tablet Take 40 mg  by mouth every morning.    Yes Historical Provider, MD  pantoprazole (PROTONIX) 40 MG tablet Take 40 mg by mouth 2 (two) times daily.  05/11/14  Yes Historical Provider, MD  sodium bicarbonate 650 MG tablet Take 2 tablets (1,300 mg total) by mouth 3 (three) times daily. Patient taking differently: Take 1,300 mg by mouth 2 (two) times daily.  10/23/14  Yes Robbie Lis, MD    Physical Exam: BP 129/59 mmHg  Pulse 95  Temp(Src) 100.1 F (37.8 C) (Oral)  Resp 16  Ht 5\' 10"  (1.778 m)  Wt 90.719 kg (200 lb)  BMI 28.70 kg/m2  SpO2 100%  General:  Drowsy due to pain meds, oriented, answer questions appropriately  Eyes: PERRL ENT: dry oral mucosa Neck: supple, no JVD Cardiovascular: RRR Respiratory: CTABL Abdomen: right CVA tenderness, soft/NT/ND, positive bowel sounds Skin: no rash Musculoskeletal:  No edema Psychiatric: calm/cooperative Neurologic: no focal findings            Labs on Admission:  Basic Metabolic Panel:  Recent Labs Lab 10/20/15 0443  NA 135  K 3.9  CL 110  CO2 18*  GLUCOSE 227*  BUN 48*  CREATININE 3.09*  CALCIUM 9.0   Liver Function Tests: No results for input(s): AST, ALT, ALKPHOS, BILITOT, PROT, ALBUMIN in the last 168 hours. No results for input(s): LIPASE, AMYLASE in the last 168 hours. No results for input(s): AMMONIA in the last 168 hours. CBC:  Recent Labs Lab 10/20/15 0443  WBC 15.6*  NEUTROABS 13.6*  HGB 11.1*  HCT 34.0*  MCV 89.9  PLT 257   Cardiac Enzymes: No results for input(s): CKTOTAL, CKMB, CKMBINDEX, TROPONINI in the last 168 hours.  BNP (last 3 results) No results for input(s): BNP in the last 8760 hours.  ProBNP (last 3 results) No results for input(s): PROBNP in the last 8760 hours.  CBG: No results for input(s): GLUCAP in the last 168 hours.  Radiological Exams on Admission: Ct Renal Stone Study  10/20/2015  CLINICAL DATA:  Initial evaluation for acute right flank pain, nausea, vomiting. Leukocytosis, WBCs in  urine. EXAM: CT ABDOMEN AND PELVIS WITHOUT CONTRAST TECHNIQUE: Multidetector CT imaging of the abdomen and pelvis was performed following the standard protocol without IV contrast. COMPARISON:  Prior MRI from 06/11/2015 as well as CT from 7/20 8/8 FINDINGS: Mild scattered atelectatic changes seen dependently within the visualized lung bases, right greater than left. Visualized lung bases are otherwise clear. No pleural or pericardial fusion. Limited noncontrast evaluation of the liver is unremarkable. Gallbladder within normal limits. No biliary dilatation. Spleen, adrenal glands, and pancreas demonstrate a normal unenhanced appearance. Stomach within normal limits. No evidence for bowel obstruction. No acute inflammatory changes about the bowels. Appendix normal. Right kidney drains into the neobladder positioned and the pelvis. There is moderate right hydroureteronephrosis with prominent right perinephric fat stranding. Multiple punctate calcific densities present within the distal right ureter, suspicious for small obstructive stones. Largest of these measures approximately 3 mm and is positioned at the  neo right UVJ (series 5, image 78). Superimposed stricture could also be contributing. Scattered foci of gas present within the anterior portion of the neobladder. Left kidney drains via a left-sided ileal conduit. There is mild to moderate left hydronephrosis and left hydroureter. More mild left-sided perinephric fat stranding. Scattered foci of gas present within the left kidney. Single punctate 3 mm nonobstructive left renal calculus noted within the interpolar left kidney. No obstructive calculi seen on the left. Prostate not visualize, and is likely surgically absent. No free intraperitoneal air. No free fluid. Shotty subcentimeter periaortic and aortocaval lymph nodes noted, similar to previous. No pathologically enlarged intra-abdominal or pelvic lymph nodes. No acute osseous abnormality. Anterior wedge  compression deformity of the T11 vertebral body is stable. Chronic 1 cm spondylolisthesis of L4 on L5 also unchanged. No worrisome lytic or blastic osseous lesions. IMPRESSION: 1. Moderate right hydroureteronephrosis with multiple subtle punctate calcific densities layering within the distal right ureter to the level of the neo right UVJ, suspicious for small obstructive stones. 2. Prominent right perinephric fat stranding. While this finding may in part be related to the underlying obstructive process, possible superimposed infection could be considered given the provided history. 3. Mild left hydronephrosis with no obstructive radiopaque calculi identified. Prominent inflammatory stranding about the left renal pelvis, also suspicious for possible superimposed infection. 4. Gas within the left renal collecting system and neobladder. While this finding may be related to the left-sided ileal conduit, superimposed infection could also be considered. Electronically Signed   By: Jeannine Boga M.D.   On: 10/20/2015 07:45    EKG: Independently reviewed. Sinus tachycardia, no acute st/t changes, QTC 489  Assessment/Plan Present on Admission:  **None**   Right hydronephnrosis/pyelo/stone: + leukocytosis, urine culture pending, empirically on rocephin, ivf,  urology consulted who recommended IR to place nephrostomy tube due to h/o neobladder, currently npo.  noninsulin dependent diabetes, check a1c, start ssi  CKD/ESRD; cr slightly above baseline, lytes ok, renal dosing meds  Metabolic acidosis: on bicarb tabs at home  H/o bladder cancer s/p resection and neobladder  DVT prophylaxis: scd's  Consultants: urology  Code Status: full   Family Communication:  Patient and multiple family members in room  Disposition Plan: admit to med surg  Time spent: 56mins  Fortunata Betty MD, PhD [facility] Pager [phone] If 7PM-7AM, please contact night-coverage at [email or web address], password [facility]

## 2015-10-20 NOTE — Consult Note (Signed)
Subjective: CC: Right flank pain  Hx:  [name] [name] is a 55 yo WM with a history of bladder cancer with a prior cystectomy and neobladder done in 1/15.   I was asked to see him in consultation by Dr. Thayer Jew for right hydro with a distal stone and fever.   He had the onset yesterday of right flank pain that was severe and associated with nausea and vomiting.   He had a CT that showed a possible 42mm right distal stone with hydro and perinephric stranding. There is mild left hydro with perinephric stranding and some air in the bladder and left collecting system which could be secondary to infection.  His UA looked infected which is not surprising with a neobladder.   He has a low grade fever and tachycardia with leukocytosis.  He has had one prior stone and also reports postop right ureteral obstruction that required a right perc tube after his cystectomy and neobladder.   He has not noticed hematuria.   He passed a large glob of material since admission but continues to have pain, but he doesn't appear acutely ill.  ROS:  Review of Systems  Constitutional: Positive for fever.  Gastrointestinal: Positive for nausea and vomiting.  Genitourinary: Positive for flank pain.  All other systems reviewed and are negative.   Allergies  Allergen Reactions  . Lisinopril Cough    Past Medical History  Diagnosis Date  . Gout     recent flair -bilateral feet--  STABLE  . Frequency of urination   . Urgency incontinence   . Nocturia   . Diabetes mellitus type 2, diet-controlled ([place]) PT STOPPED TAKING METFORMIN--  HAS BEEN WATCHING DIET, EXERCISING AND LOSING WT  . Bladder cancer ([place]) 2013  . Complication of anesthesia     agitation w/awakening in 9/13,OK 01/24/12  . Hypertension   . Anxiety   . Depression   . GERD (gastroesophageal reflux disease)   . Acute renal injury ([place]) 11/2013  . Anemia   . Kidney failure     Past Surgical History  Procedure Laterality Date  . Ulner artery  repair Right 2009    rt -trauma /thrombectomy,repair  . Transurethral resection of bladder tumor  12/15/2011    Procedure: TRANSURETHRAL RESECTION OF BLADDER TUMOR (TURBT);  Surgeon: Molli Hazard, MD;  Location: [facility];  Service: Urology;  Laterality: N/A;  2 HRS   . Lumbar laminectomy  06-02-2005    W/ RESECTION NERVE ROOT  L4  -  L5  . Transurethral resection of bladder tumor  01/24/2012    Procedure: TRANSURETHRAL RESECTION OF BLADDER TUMOR (TURBT);  Surgeon: Molli Hazard, MD;  Location: [facility];  Service: Urology;  Laterality: N/A;  90 MIN   . Cystoscopy with urethral dilatation  01/24/2012    Procedure: CYSTOSCOPY WITH URETHRAL DILATATION;  Surgeon: Molli Hazard, MD;  Location: [facility];  Service: Urology;  Laterality: N/A;  . Transurethral resection of bladder tumor N/A 07/10/2012    Procedure: TRANSURETHRAL RESECTION OF BLADDER TUMOR (TURBT);  Surgeon: Molli Hazard, MD;  Location: [facility];  Service: Urology;  Laterality: N/A;  . Cystoscopy w/ retrogrades Bilateral 07/10/2012    Procedure: CYSTOSCOPY WITH RETROGRADE PYELOGRAM;  Surgeon: Molli Hazard, MD;  Location: [facility];  Service: Urology;  Laterality: Bilateral;  . Back surgery    . Bladder surgery  04/26/2013    at the cancer treatment center of  [place] in  [place]    . Av fistula placement Left 05/31/2014    Procedure: LEFT RADIOCEPHALIC ARTERIOVENOUS (AV) FISTULA CREATION ;  Surgeon: Mal Misty, MD;  Location: [place];  Service: Vascular;  Laterality: Left;  . Nephrostomy tube placement ([facility] hx)  2015    right     Social History   Social History  . Marital Status: Married    Spouse Name: N/A  . Number of Children: N/A  . Years of Education: N/A   Occupational History  . Not on file.   Social History Main Topics  . Smoking status: Former Smoker -- 1.00 packs/day for 16 years     Types: Cigarettes    Quit date: 01/20/1992  . Smokeless tobacco: Former Systems developer    Types: [place] date: 01/20/1992  . Alcohol Use: No  . Drug Use: No  . Sexual Activity: Not Currently   Other Topics Concern  . Not on file   Social History Narrative    Family History  Problem Relation Age of Onset  . Diabetes Mellitus II Other   . Hypertension Other   . Bladder Cancer Neg Hx     Anti-infectives: Anti-infectives    Start     Dose/Rate Route Frequency Ordered Stop   10/21/15 0600  cefTRIAXone (ROCEPHIN) 1 g in dextrose 5 % 50 mL IVPB     1 g 100 mL/hr over 30 Minutes Intravenous Every 24 hours 10/20/15 1152     10/20/15 0545  cefTRIAXone (ROCEPHIN) 1 g in dextrose 5 % 50 mL IVPB     1 g 100 mL/hr over 30 Minutes Intravenous  Once 10/20/15 0537 10/20/15 [id number]      Current Facility-Administered Medications  Medication Dose Route Frequency Provider Last Rate Last Dose  . [START ON 10/21/2015] cefTRIAXone (ROCEPHIN) 1 g in dextrose 5 % 50 mL IVPB  1 g Intravenous Q24H Florencia Reasons, MD      . HYDROmorphone (DILAUDID) injection 1 mg  1 mg Intravenous Q4H PRN Florencia Reasons, MD      . insulin aspart (novoLOG) injection 0-9 Units  0-9 Units Subcutaneous Q4H Florencia Reasons, MD   0 Units at 10/20/15 1200  . lactated ringers infusion   Intravenous Continuous Florencia Reasons, MD       Past med, surg, soc and fam history reviewed.   Objective: Vital signs in last 24 hours: Temp:  [98.5 F (36.9 C)-100.1 F (37.8 C)] 98.5 F (36.9 C) (07/17 0917) Pulse Rate:  [84-109] 109 (07/17 0917) Resp:  [16-18] 16 (07/17 0917) BP: (114-146)/(59-79) 129/59 mmHg (07/17 0917) SpO2:  [94 %-100 %] 98 % (07/17 0917) Weight:  [89.359 kg (197 lb)-90.719 kg (200 lb)] 89.359 kg (197 lb) (07/17 0917)  Intake/Output from previous day:   Intake/Output this shift:     Physical Exam  Constitutional: He is oriented to person, place, and time and well-developed, well-nourished, and in no distress.  HENT:  Head: Normocephalic  and atraumatic.  Neck: Normal range of motion. Neck supple.  Cardiovascular: Regular rhythm and normal heart sounds.   Tachycardia.   He has an AV fistula in the left wrist.   Pulmonary/Chest: Effort normal and breath sounds normal. No respiratory distress.  Abdominal: Soft. He exhibits no distension and no mass. There is tenderness (moderate right flank tenderness.). There is no guarding.  Musculoskeletal: Normal range of motion. He exhibits no edema or tenderness.  Lymphadenopathy:    He has no cervical adenopathy.  Neurological:  He is alert and oriented to person, place, and time.  Skin: Skin is warm and dry.  Psychiatric: Mood and affect normal.    Lab Results:   Recent Labs  10/20/15 0443  WBC 15.6*  HGB 11.1*  HCT 34.0*  PLT 257   BMET  Recent Labs  10/20/15 0443  NA 135  K 3.9  CL 110  CO2 18*  GLUCOSE 227*  BUN 48*  CREATININE 3.09*  CALCIUM 9.0   PT/INR  Recent Labs  10/20/15 0924  LABPROT 14.6  INR 1.17   ABG No results for input(s): PHART, HCO3 in the last 72 hours.  Invalid input(s): PCO2, PO2  Studies/Results: Ct Renal Stone Study  10/20/2015  CLINICAL DATA:  Initial evaluation for acute right flank pain, nausea, vomiting. Leukocytosis, WBCs in urine. EXAM: CT ABDOMEN AND PELVIS WITHOUT CONTRAST TECHNIQUE: Multidetector CT imaging of the abdomen and pelvis was performed following the standard protocol without IV contrast. COMPARISON:  Prior MRI from 06/11/2015 as well as CT from 7/20 8/8 FINDINGS: Mild scattered atelectatic changes seen dependently within the visualized lung bases, right greater than left. Visualized lung bases are otherwise clear. No pleural or pericardial fusion. Limited noncontrast evaluation of the liver is unremarkable. Gallbladder within normal limits. No biliary dilatation. Spleen, adrenal glands, and pancreas demonstrate a normal unenhanced appearance. Stomach within normal limits. No evidence for bowel obstruction. No acute  inflammatory changes about the bowels. Appendix normal. Right kidney drains into the neobladder positioned and the pelvis. There is moderate right hydroureteronephrosis with prominent right perinephric fat stranding. Multiple punctate calcific densities present within the distal right ureter, suspicious for small obstructive stones. Largest of these measures approximately 3 mm and is positioned at the neo right UVJ (series 5, image 78). Superimposed stricture could also be contributing. Scattered foci of gas present within the anterior portion of the neobladder. Left kidney drains via a left-sided ileal conduit. There is mild to moderate left hydronephrosis and left hydroureter. More mild left-sided perinephric fat stranding. Scattered foci of gas present within the left kidney. Single punctate 3 mm nonobstructive left renal calculus noted within the interpolar left kidney. No obstructive calculi seen on the left. Prostate not visualize, and is likely surgically absent. No free intraperitoneal air. No free fluid. Shotty subcentimeter periaortic and aortocaval lymph nodes noted, similar to previous. No pathologically enlarged intra-abdominal or pelvic lymph nodes. No acute osseous abnormality. Anterior wedge compression deformity of the T11 vertebral body is stable. Chronic 1 cm spondylolisthesis of L4 on L5 also unchanged. No worrisome lytic or blastic osseous lesions. IMPRESSION: 1. Moderate right hydroureteronephrosis with multiple subtle punctate calcific densities layering within the distal right ureter to the level of the neo right UVJ, suspicious for small obstructive stones. 2. Prominent right perinephric fat stranding. While this finding may in part be related to the underlying obstructive process, possible superimposed infection could be considered given the provided history. 3. Mild left hydronephrosis with no obstructive radiopaque calculi identified. Prominent inflammatory stranding about the left renal  pelvis, also suspicious for possible superimposed infection. 4. Gas within the left renal collecting system and neobladder. While this finding may be related to the left-sided ileal conduit, superimposed infection could also be considered. Electronically Signed   By: Jeannine Boga M.D.   On: 10/20/2015 07:45   Case discussed with Dr. Dina Rich.   CT films and report reviewed.   Labs reviewed.   Case discussed with Dr. Francena Hanly.   Assessment: 1. Right distal ureteral stone with  obstruction, low grade fever and leukocytosis in a patient with a neobladder following radical cystectomy for bladder cancer. 2. Air in the left collecting system with no pain in the left flank.   This probably represents refluxed air from the neobladder with gas forming bacterial colonization/infection. 3. CRI  Rec: 1. He has passed some material that looks like mucous but still has tenderness in the flank.   I will have IR reassess the hydro and if it persists, he will need a right perc tube because of the fever and leukocytosis associated with the obstruction. 2. I don't think he needs intervention on the left despite the gas in the collecting system since he has no symptoms on that side. 3. Continue antibiotics.     CC: Dr. Thayer Jew, Dr. Corliss Parish and Dr. Dutch Gray.     Kendryck Lacroix J 10/20/2015 [phone]

## 2015-10-20 NOTE — ED Notes (Signed)
Pt states around midnight he began having right flank pain and four episodes of emesis since.  States the pain is now beginning to radiate towards the right anterior abdomen.

## 2015-10-20 NOTE — ED Provider Notes (Addendum)
CSN: [id number]     Arrival date & time 10/20/15  0410 History   First MD Initiated Contact with Patient 10/20/15 0435     Chief Complaint  Patient presents with  . Flank Pain     (Consider location/radiation/quality/duration/timing/severity/associated sxs/prior Treatment) HPI  This is a 55 year old male with history of gout, diabetes, bladder cancer status post transection, end-stage renal disease not on dialysis who presents with right back and right lower quadrant pain. Onset of symptoms last night at midnight. Acute onset. It comes and goes. Nothing seems to make it better or worse. Currently he rates his pain a 10 out of 10. He does have a history of kidney stones but is unsure if this feels similarly. Denies fevers. Denies dysuria or hematuria. Patient reports several episodes of nonbilious nonbloody emesis.  Past Medical History  Diagnosis Date  . Gout     recent flair -bilateral feet--  STABLE  . Frequency of urination   . Urgency incontinence   . Nocturia   . Diabetes mellitus type 2, diet-controlled ([place]) PT STOPPED TAKING METFORMIN--  HAS BEEN WATCHING DIET, EXERCISING AND LOSING WT  . Bladder cancer ([place]) 2013  . Complication of anesthesia     agitation w/awakening in 9/13,OK 01/24/12  . Hypertension   . Anxiety   . Depression   . GERD (gastroesophageal reflux disease)   . Acute renal injury ([place]) 11/2013  . Anemia   . Kidney failure    Past Surgical History  Procedure Laterality Date  . Ulner artery repair Right 2009    rt -trauma /thrombectomy,repair  . Transurethral resection of bladder tumor  12/15/2011    Procedure: TRANSURETHRAL RESECTION OF BLADDER TUMOR (TURBT);  Surgeon: Molli Hazard, MD;  Location: [facility];  Service: Urology;  Laterality: N/A;  2 HRS   . Lumbar laminectomy  06-02-2005    W/ RESECTION NERVE ROOT  L4  -  L5  . Transurethral resection of bladder tumor  01/24/2012    Procedure: TRANSURETHRAL RESECTION OF BLADDER  TUMOR (TURBT);  Surgeon: Molli Hazard, MD;  Location: [facility];  Service: Urology;  Laterality: N/A;  90 MIN   . Cystoscopy with urethral dilatation  01/24/2012    Procedure: CYSTOSCOPY WITH URETHRAL DILATATION;  Surgeon: Molli Hazard, MD;  [facility];  Service: Urology;  Laterality: N/A;  . Transurethral resection of bladder tumor N/A 07/10/2012    Procedure: TRANSURETHRAL RESECTION OF BLADDER TUMOR (TURBT);  Surgeon: Molli Hazard, MD;  Location: [facility];  Service: Urology;  Laterality: N/A;  . Cystoscopy w/ retrogrades Bilateral 07/10/2012    Procedure: CYSTOSCOPY WITH RETROGRADE PYELOGRAM;  Surgeon: Molli Hazard, MD;  Location: [facility];  Service: Urology;  Laterality: Bilateral;  . Back surgery    . Bladder surgery  04/26/2013    at the cancer treatment center of [place] in  [place]    . Av fistula placement Left 05/31/2014    Procedure: LEFT RADIOCEPHALIC ARTERIOVENOUS (AV) FISTULA CREATION ;  Surgeon: Mal Misty, MD;  Location: [facility];  Service: Vascular;  Laterality: Left;   Family History  Problem Relation Age of Onset  . Diabetes Mellitus II Other   . Hypertension Other   . Bladder Cancer Neg Hx    Social History  Substance Use Topics  . Smoking status: Former Smoker -- 1.00 packs/day for 16 years    Types: Cigarettes    Quit date: 01/20/1992  .  Smokeless tobacco: Former Systems developer    Types: [place] date: 01/20/1992  . Alcohol Use: No    Review of Systems  Constitutional: Negative for fever.  Respiratory: Negative for shortness of breath.   Cardiovascular: Negative for chest pain.  Gastrointestinal: Positive for abdominal pain. Negative for nausea and vomiting.  Genitourinary: Negative for dysuria and difficulty urinating.  All other systems reviewed and are negative.     Allergies  Lisinopril  Home Medications   Prior to Admission medications    Medication Sig Start Date End Date Taking? Authorizing Provider  citalopram (CELEXA) 40 MG tablet Take 40 mg by mouth every morning.    Yes Historical Provider, MD  pantoprazole (PROTONIX) 40 MG tablet Take 40 mg by mouth 2 (two) times daily.  05/11/14  Yes Historical Provider, MD  sodium bicarbonate 650 MG tablet Take 2 tablets (1,300 mg total) by mouth 3 (three) times daily. Patient taking differently: Take 1,300 mg by mouth 2 (two) times daily.  10/23/14  Yes Robbie Lis, MD   BP 114/73 mmHg  Pulse 84  Temp(Src) 100.1 F (37.8 C) (Oral)  Resp 18  Ht 5\' 10"  (1.778 m)  Wt 200 lb (90.719 kg)  BMI 28.70 kg/m2  SpO2 100% Physical Exam  Constitutional: He is oriented to person, place, and time. He appears well-developed and well-nourished. No distress.  HENT:  Head: Normocephalic and atraumatic.  Cardiovascular: Normal rate, regular rhythm and normal heart sounds.   No murmur heard. Pulmonary/Chest: Effort normal and breath sounds normal. No respiratory distress. He has no wheezes.  Abdominal: Soft. Bowel sounds are normal. There is tenderness. There is no rebound and no guarding.  Suprapubic tenderness to palpation without rebound or guarding  Musculoskeletal: He exhibits no edema.  Neurological: He is alert and oriented to person, place, and time.  Skin: Skin is warm and dry.  Psychiatric: He has a normal mood and affect.  Nursing note and vitals reviewed.   ED Course  Procedures (including critical care time) Labs Review Labs Reviewed  URINALYSIS, ROUTINE W REFLEX MICROSCOPIC (NOT AT [facility]) - Abnormal; Notable for the following:    APPearance CLOUDY (*)    Hgb urine dipstick LARGE (*)    Protein, ur 30 (*)    Nitrite POSITIVE (*)    Leukocytes, UA LARGE (*)    All other components within normal limits  CBC WITH DIFFERENTIAL/PLATELET - Abnormal; Notable for the following:    WBC 15.6 (*)    RBC 3.78 (*)    Hemoglobin 11.1 (*)    HCT 34.0 (*)    Neutro Abs 13.6 (*)     Monocytes Absolute 1.1 (*)    All other components within normal limits  BASIC METABOLIC PANEL - Abnormal; Notable for the following:    CO2 18 (*)    Glucose, Bld 227 (*)    BUN 48 (*)    Creatinine, Ser 3.09 (*)    GFR calc non Af Amer 21 (*)    GFR calc Af Amer 24 (*)    All other components within normal limits  URINE MICROSCOPIC-ADD ON - Abnormal; Notable for the following:    Squamous Epithelial / LPF 0-5 (*)    Bacteria, UA MANY (*)    All other components within normal limits  URINE CULTURE    Imaging Review Ct Renal Stone Study  10/20/2015  CLINICAL DATA:  Initial evaluation for acute right flank pain, nausea, vomiting. Leukocytosis, WBCs in urine. EXAM: CT  ABDOMEN AND PELVIS WITHOUT CONTRAST TECHNIQUE: Multidetector CT imaging of the abdomen and pelvis was performed following the standard protocol without IV contrast. COMPARISON:  Prior MRI from 06/11/2015 as well as CT from 7/20 8/8 FINDINGS: Mild scattered atelectatic changes seen dependently within the visualized lung bases, right greater than left. Visualized lung bases are otherwise clear. No pleural or pericardial fusion. Limited noncontrast evaluation of the liver is unremarkable. Gallbladder within normal limits. No biliary dilatation. Spleen, adrenal glands, and pancreas demonstrate a normal unenhanced appearance. Stomach within normal limits. No evidence for bowel obstruction. No acute inflammatory changes about the bowels. Appendix normal. Right kidney drains into the neobladder positioned and the pelvis. There is moderate right hydroureteronephrosis with prominent right perinephric fat stranding. Multiple punctate calcific densities present within the distal right ureter, suspicious for small obstructive stones. Largest of these measures approximately 3 mm and is positioned at the neo right UVJ (series 5, image 78). Superimposed stricture could also be contributing. Scattered foci of gas present within the anterior portion of  the neobladder. Left kidney drains via a left-sided ileal conduit. There is mild to moderate left hydronephrosis and left hydroureter. More mild left-sided perinephric fat stranding. Scattered foci of gas present within the left kidney. Single punctate 3 mm nonobstructive left renal calculus noted within the interpolar left kidney. No obstructive calculi seen on the left. Prostate not visualize, and is likely surgically absent. No free intraperitoneal air. No free fluid. Shotty subcentimeter periaortic and aortocaval lymph nodes noted, similar to previous. No pathologically enlarged intra-abdominal or pelvic lymph nodes. No acute osseous abnormality. Anterior wedge compression deformity of the T11 vertebral body is stable. Chronic 1 cm spondylolisthesis of L4 on L5 also unchanged. No worrisome lytic or blastic osseous lesions. IMPRESSION: 1. Moderate right hydroureteronephrosis with multiple subtle punctate calcific densities layering within the distal right ureter to the level of the neo right UVJ, suspicious for small obstructive stones. 2. Prominent right perinephric fat stranding. While this finding may in part be related to the underlying obstructive process, possible superimposed infection could be considered given the provided history. 3. Mild left hydronephrosis with no obstructive radiopaque calculi identified. Prominent inflammatory stranding about the left renal pelvis, also suspicious for possible superimposed infection. 4. Gas within the left renal collecting system and neobladder. While this finding may be related to the left-sided ileal conduit, superimposed infection could also be considered. Electronically Signed   By: Jeannine Boga M.D.   On: 10/20/2015 07:45   I have personally reviewed and evaluated these images and lab results as part of my medical decision-making.   EKG Interpretation None      MDM   Final diagnoses:  Kidney stone  UTI (lower urinary tract infection)     Patient presents with right flank and right abdominal pain. Does have some suprapubic tenderness on exam. Otherwise nontoxic. Temperature of 100.1. Otherwise vitals are reassuring. Patient was given pain and nausea medication. Urinalysis appears infected with too numerous to count white cells and many bacteria. Nitrite positive. Urine culture sent. Patient given Rocephin. He was also given fluids. Renal stone study obtained to evaluate for an infected stone. Patient required multiple doses of pain and nausea medication while in the ER. CT stone study concerning for right-sided hydronephrosis as well as small obstructive stones. There is perinephric fat stranding suggestive of infection. Will consult urology and anticipate admission to medicine.    Merryl Hacker, MD 10/20/15 [phone]  Discussed with Dr. Jeffie Pollock. Feels the patient may need  a right nephrostomy tube given neobladder and evidence of infection with obstruction. Patient was made nothing by mouth. He will evaluate. IR nephrostomy placement ordered. Will admit to medicine.  Merryl Hacker, MD 10/20/15 [phone]

## 2015-10-21 LAB — GLUCOSE, CAPILLARY: GLUCOSE-CAPILLARY: 131 mg/dL — AB (ref 65–99)

## 2015-10-21 LAB — BASIC METABOLIC PANEL
ANION GAP: 6 (ref 5–15)
BUN: 45 mg/dL — ABNORMAL HIGH (ref 6–20)
CO2: 18 mmol/L — ABNORMAL LOW (ref 22–32)
Calcium: 8.7 mg/dL — ABNORMAL LOW (ref 8.9–10.3)
Chloride: 110 mmol/L (ref 101–111)
Creatinine, Ser: 3.02 mg/dL — ABNORMAL HIGH (ref 0.61–1.24)
GFR calc Af Amer: 25 mL/min — ABNORMAL LOW (ref >60)
GFR, EST NON AFRICAN AMERICAN: 21 mL/min — AB (ref >60)
Glucose, Bld: 139 mg/dL — ABNORMAL HIGH (ref 65–99)
POTASSIUM: 4 mmol/L (ref 3.5–5.1)
SODIUM: 134 mmol/L — AB (ref 135–145)

## 2015-10-21 LAB — URINE CULTURE

## 2015-10-21 LAB — CBC
HCT: 32.4 % — ABNORMAL LOW (ref 39.0–52.0)
Hemoglobin: 10.6 g/dL — ABNORMAL LOW (ref 13.0–17.0)
MCH: 29.8 pg (ref 26.0–34.0)
MCHC: 32.7 g/dL (ref 30.0–36.0)
MCV: 91 fL (ref 78.0–100.0)
PLATELETS: 224 10*3/uL (ref 150–400)
RBC: 3.56 MIL/uL — AB (ref 4.22–5.81)
RDW: 14.1 % (ref 11.5–15.5)
WBC: 11.3 10*3/uL — AB (ref 4.0–10.5)

## 2015-10-21 MED ORDER — PANTOPRAZOLE SODIUM 40 MG PO TBEC
40.0000 mg | DELAYED_RELEASE_TABLET | Freq: Two times a day (BID) | ORAL | Status: DC
  Administered 2015-10-21: 40 mg via ORAL
  Filled 2015-10-21: qty 1

## 2015-10-21 MED ORDER — SODIUM BICARBONATE 650 MG PO TABS
1300.0000 mg | ORAL_TABLET | Freq: Two times a day (BID) | ORAL | Status: DC
  Administered 2015-10-21: 1300 mg via ORAL
  Filled 2015-10-21: qty 2

## 2015-10-21 MED ORDER — CITALOPRAM HYDROBROMIDE 20 MG PO TABS
40.0000 mg | ORAL_TABLET | Freq: Every morning | ORAL | Status: DC
  Administered 2015-10-21: 40 mg via ORAL
  Filled 2015-10-21: qty 2

## 2015-10-21 NOTE — Progress Notes (Signed)
Discharge instructions reviewed with patient. Patient verbalized understanding. 

## 2015-10-21 NOTE — Discharge Summary (Signed)
Discharge Summary  [name] [name] [id number] DOB: March 21, 1961  PCP: [facility] date: 10/20/2015 Discharge date: 10/21/2015  Time spent: <22mins  Recommendations for Outpatient Follow-up:  1. F/u with PMD within a week  for hospital discharge follow up, repeat cbc/bmp at follow up 2. F/u with nephrology Dr Moshe Cipro and urology Dr Alinda Money  Discharge Diagnoses:  Active Hospital Problems   Diagnosis Date Noted  . Pyelonephritis 10/20/2015  . Hydronephrosis 10/20/2015    Resolved Hospital Problems   Diagnosis Date Noted Date Resolved  No resolved problems to display.    Discharge Condition: stable  Diet recommendation: heart healthy/carb modified  Filed Weights   10/20/15 0432 10/20/15 0917  Weight: 90.719 kg (200 lb) 89.359 kg (197 lb)    History of present illness:  Chief Complaint: abdominal pain, right flank pain  HPI: [name] [name] is a 55 y.o. male   H/o gout, diet controlled diabetes, ESRD with AV fistula, not on dialysis, bladder cancer with neobladder presented to the ED due to abdominal pain and right flank pain, emesis,   ED course: tmax 100.1, tachycardia (hr 102), but otherwise nontoxic appearing, wbc 15.6, cr 3.09 (possibly close to baseline), glucose 227, UA+ many bacteria, large leuk, +nitrite, CT stone study + moderate right hydroureteronephrosis? distal right ureter stone, right perinephric fat stranding. He is given analgesics,antiemetics and rocephin. EDP contacted urology who recommend IR for right nephrostomy tube placement.  Hospital Course:  Active Problems:   Pyelonephritis   Hydronephrosis   Right hydronephnrosis:  + leukocytosis wbc 15.6 on admission, tmax 100.1, urine culture inconclusive, empirically on rocephin, ivf,  urology consulted who recommended IR to place nephrostomy tube due to h/o neobladder, procedure cancelled after patient spontaneously passed a large amount of  Mucus from  neobladder with resolving of right sided flank pain, repeat ultrasound scan demonstrates a minimal amount of right sided hydro, improved from prior CT after he passed the mucus. I discussed with patient's urologist Dr Alinda Money who agreed to discharge patient home without abx, patient is advised to call urology if symptom returned or if fever, otherwise can keep regular follow up appointment with urology.  noninsulin dependent diabetes, a1c pending, last a1c in 2016 was 5.5, on ssi in the hospital, continue diet control  CKD/ESRD; cr slightly above baseline, lytes ok, renal dosing meds  Metabolic acidosis: on bicarb tabs at home  H/o bladder cancer s/p resection and neobladder    Consultants: urology  Code Status: full   Family Communication: Patient   Disposition Plan: home    Discharge Exam: BP 116/63 mmHg  Pulse 78  Temp(Src) 97.8 F (36.6 C) (Oral)  Resp 18  Ht 5\' 10"  (1.778 m)  Wt 89.359 kg (197 lb)  BMI 28.27 kg/m2  SpO2 98%  General: aaox3 Cardiovascular: RRR Respiratory: CTABL  Discharge Instructions You were cared for by a hospitalist during your hospital stay. If you have any questions about your discharge medications or the care you received while you were in the hospital after you are discharged, you can call the unit and asked to speak with the hospitalist on call if the hospitalist that took care of you is not available. Once you are discharged, your primary care physician will handle any further medical issues. Please note that NO REFILLS for any discharge medications will be authorized once you are discharged, as it is imperative that you return to your primary care physician (or establish a relationship with a primary care physician if  you do not have one) for your aftercare needs so that they can reassess your need for medications and monitor your lab values.     Medication List    TAKE these medications        citalopram 40 MG tablet  Commonly known  as:  CELEXA  Take 40 mg by mouth every morning.     pantoprazole 40 MG tablet  Commonly known as:  PROTONIX  Take 40 mg by mouth 2 (two) times daily.     sodium bicarbonate 650 MG tablet  Take 2 tablets (1,300 mg total) by mouth 3 (three) times daily.       Allergies  Allergen Reactions  . Lisinopril Cough   Follow-up Information    Follow up with Dutch Gray, MD.   Specialty:  Urology   Why:  call if early if you developed flank pain/fever   Contact information:   [place] [address] [phone]       Follow up with Louis Meckel, MD.   Specialty:  Nephrology   Contact information:   [place] [address] [phone]       Follow up with [facility] In 1 week.   Specialty:  Family Medicine   Why:  hospital discharge follow up   Contact information:   [address] [place] [address] [phone]        The results of significant diagnostics from this hospitalization (including imaging, microbiology, ancillary and laboratory) are listed below for reference.    Significant Diagnostic Studies: Ct Renal Stone Study  10/20/2015  CLINICAL DATA:  Initial evaluation for acute right flank pain, nausea, vomiting. Leukocytosis, WBCs in urine. EXAM: CT ABDOMEN AND PELVIS WITHOUT CONTRAST TECHNIQUE: Multidetector CT imaging of the abdomen and pelvis was performed following the standard protocol without IV contrast. COMPARISON:  Prior MRI from 06/11/2015 as well as CT from 7/20 8/8 FINDINGS: Mild scattered atelectatic changes seen dependently within the visualized lung bases, right greater than left. Visualized lung bases are otherwise clear. No pleural or pericardial fusion. Limited noncontrast evaluation of the liver is unremarkable. Gallbladder within normal limits. No biliary dilatation. Spleen, adrenal glands, and pancreas demonstrate a normal unenhanced appearance. Stomach within normal limits. No  evidence for bowel obstruction. No acute inflammatory changes about the bowels. Appendix normal. Right kidney drains into the neobladder positioned and the pelvis. There is moderate right hydroureteronephrosis with prominent right perinephric fat stranding. Multiple punctate calcific densities present within the distal right ureter, suspicious for small obstructive stones. Largest of these measures approximately 3 mm and is positioned at the neo right UVJ (series 5, image 78). Superimposed stricture could also be contributing. Scattered foci of gas present within the anterior portion of the neobladder. Left kidney drains via a left-sided ileal conduit. There is mild to moderate left hydronephrosis and left hydroureter. More mild left-sided perinephric fat stranding. Scattered foci of gas present within the left kidney. Single punctate 3 mm nonobstructive left renal calculus noted within the interpolar left kidney. No obstructive calculi seen on the left. Prostate not visualize, and is likely surgically absent. No free intraperitoneal air. No free fluid. Shotty subcentimeter periaortic and aortocaval lymph nodes noted, similar to previous. No pathologically enlarged intra-abdominal or pelvic lymph nodes. No acute osseous abnormality. Anterior wedge compression deformity of the T11 vertebral body is stable. Chronic 1 cm spondylolisthesis of L4 on L5 also unchanged. No worrisome lytic or blastic osseous lesions. IMPRESSION: 1. Moderate  right hydroureteronephrosis with multiple subtle punctate calcific densities layering within the distal right ureter to the level of the neo right UVJ, suspicious for small obstructive stones. 2. Prominent right perinephric fat stranding. While this finding may in part be related to the underlying obstructive process, possible superimposed infection could be considered given the provided history. 3. Mild left hydronephrosis with no obstructive radiopaque calculi identified. Prominent  inflammatory stranding about the left renal pelvis, also suspicious for possible superimposed infection. 4. Gas within the left renal collecting system and neobladder. While this finding may be related to the left-sided ileal conduit, superimposed infection could also be considered. Electronically Signed   By: Jeannine Boga M.D.   On: 10/20/2015 07:45    Microbiology: Recent Results (from the past 240 hour(s))  Urine culture     Status: Abnormal   Collection Time: 10/20/15  4:49 AM  Result Value Ref Range Status   Specimen Description URINE, CLEAN CATCH  Final   Special Requests NONE  Final   Culture MULTIPLE SPECIES PRESENT, SUGGEST RECOLLECTION (A)  Final   Report Status 10/21/2015 FINAL  Final     Labs: Basic Metabolic Panel:  Recent Labs Lab 10/20/15 0443 10/21/15 0401  NA 135 134*  K 3.9 4.0  CL 110 110  CO2 18* 18*  GLUCOSE 227* 139*  BUN 48* 45*  CREATININE 3.09* 3.02*  CALCIUM 9.0 8.7*   Liver Function Tests: No results for input(s): AST, ALT, ALKPHOS, BILITOT, PROT, ALBUMIN in the last 168 hours. No results for input(s): LIPASE, AMYLASE in the last 168 hours. No results for input(s): AMMONIA in the last 168 hours. CBC:  Recent Labs Lab 10/20/15 0443 10/21/15 0401  WBC 15.6* 11.3*  NEUTROABS 13.6*  --   HGB 11.1* 10.6*  HCT 34.0* 32.4*  MCV 89.9 91.0  PLT 257 224   Cardiac Enzymes: No results for input(s): CKTOTAL, CKMB, CKMBINDEX, TROPONINI in the last 168 hours. BNP: BNP (last 3 results) No results for input(s): BNP in the last 8760 hours.  ProBNP (last 3 results) No results for input(s): PROBNP in the last 8760 hours.  CBG:  Recent Labs Lab 10/20/15 1156 10/20/15 1717 10/20/15 2227 10/21/15 0727  GLUCAP 113* 139* 164* 131*       Signed:  Asma Boldon MD, PhD  [facility] 10/21/2015, 10:49 AM

## 2015-10-22 LAB — HEMOGLOBIN A1C
HEMOGLOBIN A1C: 6.1 % — AB (ref 4.8–5.6)
MEAN PLASMA GLUCOSE: 128 mg/dL

## 2015-10-25 LAB — CULTURE, BLOOD (ROUTINE X 2)
Culture: NO GROWTH
Culture: NO GROWTH

## 2015-11-10 ENCOUNTER — Encounter (HOSPITAL_COMMUNITY): Payer: Self-pay

## 2016-04-16 IMAGING — CR DG CHEST 2V
2 series · 2 of 2 positions shown · non-contrast
Comparison: Chest x-rays dated 06/25/2013 and 05/24/2013

CLINICAL DATA: Weakness.

EXAM:
CHEST  2 VIEW

[w chest pa]
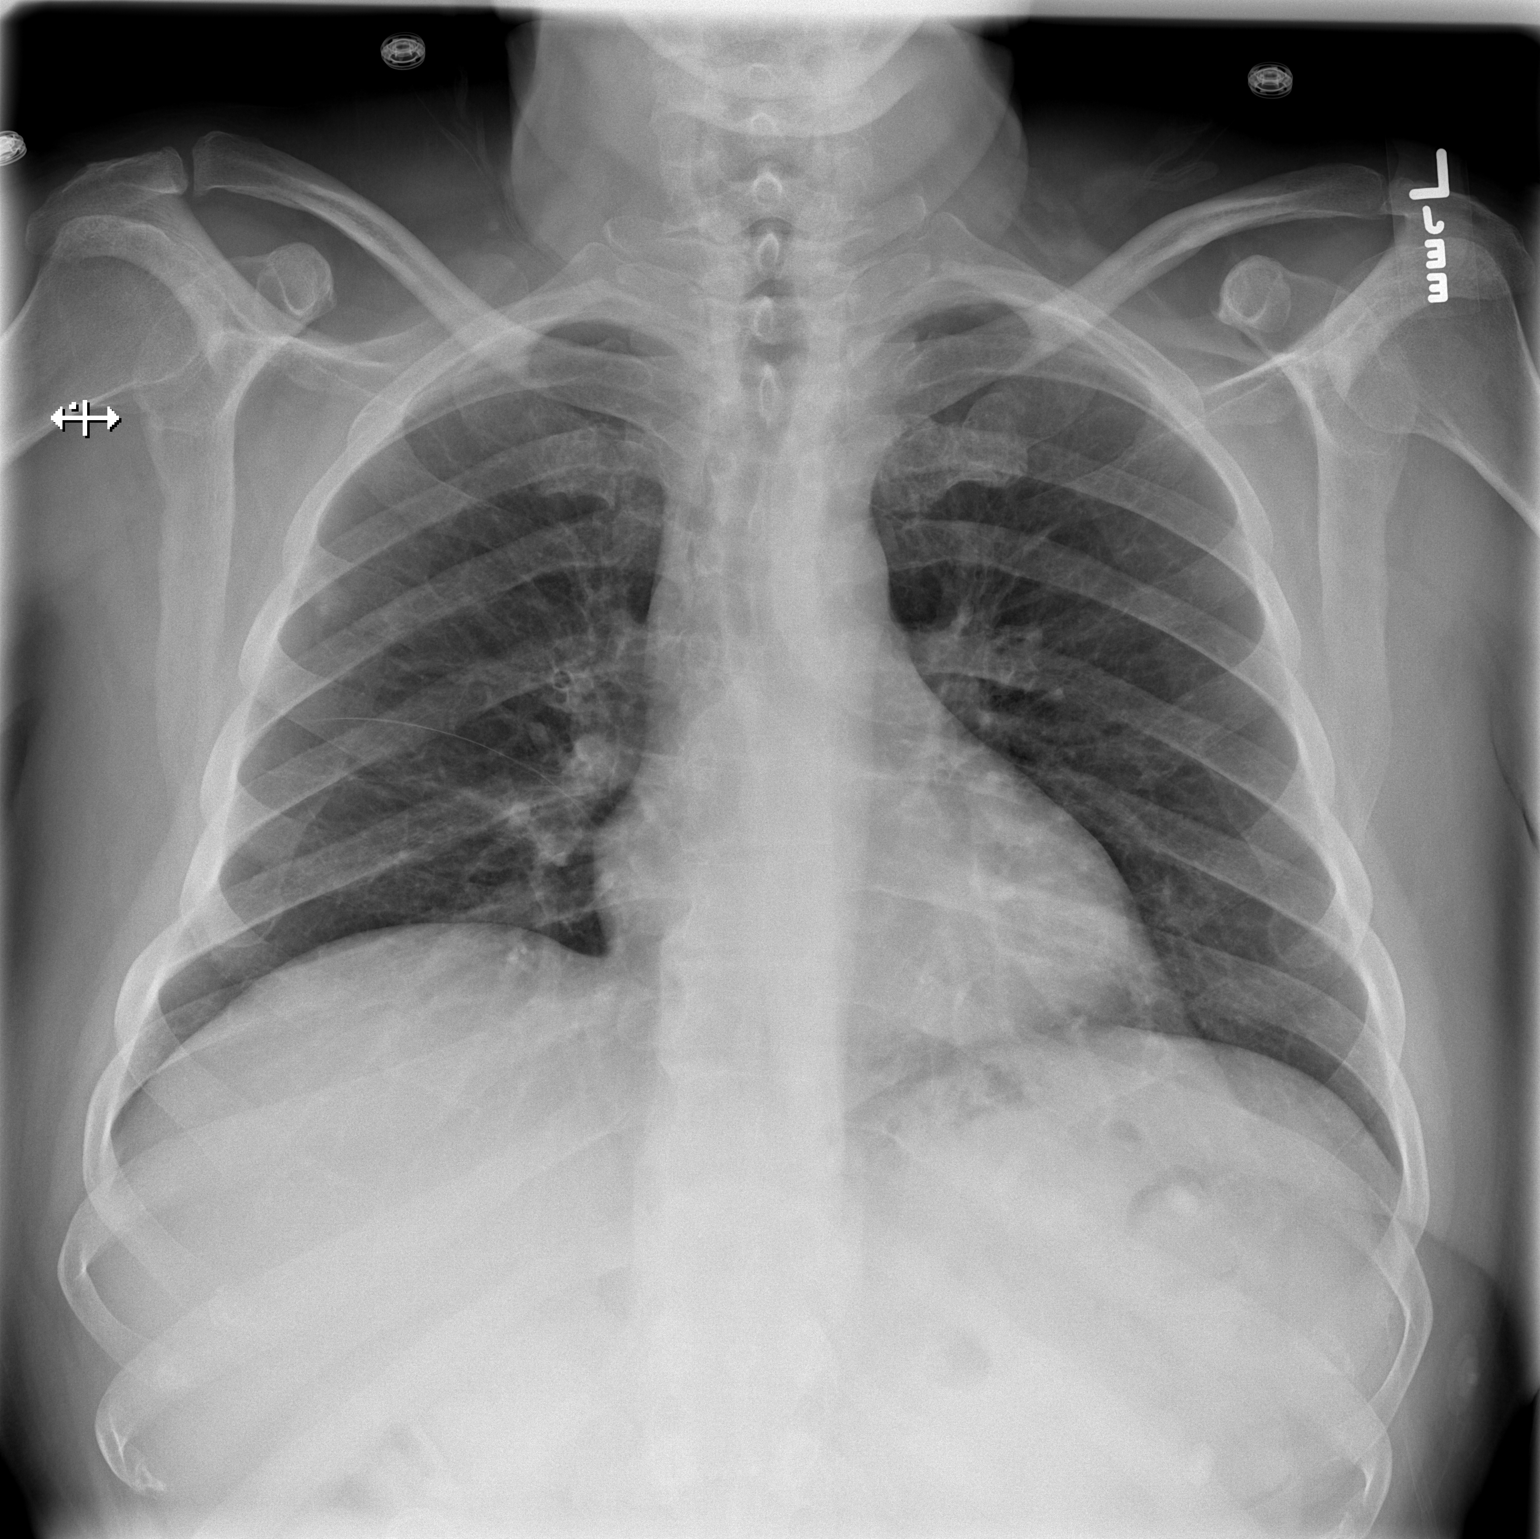

[w chest lat]
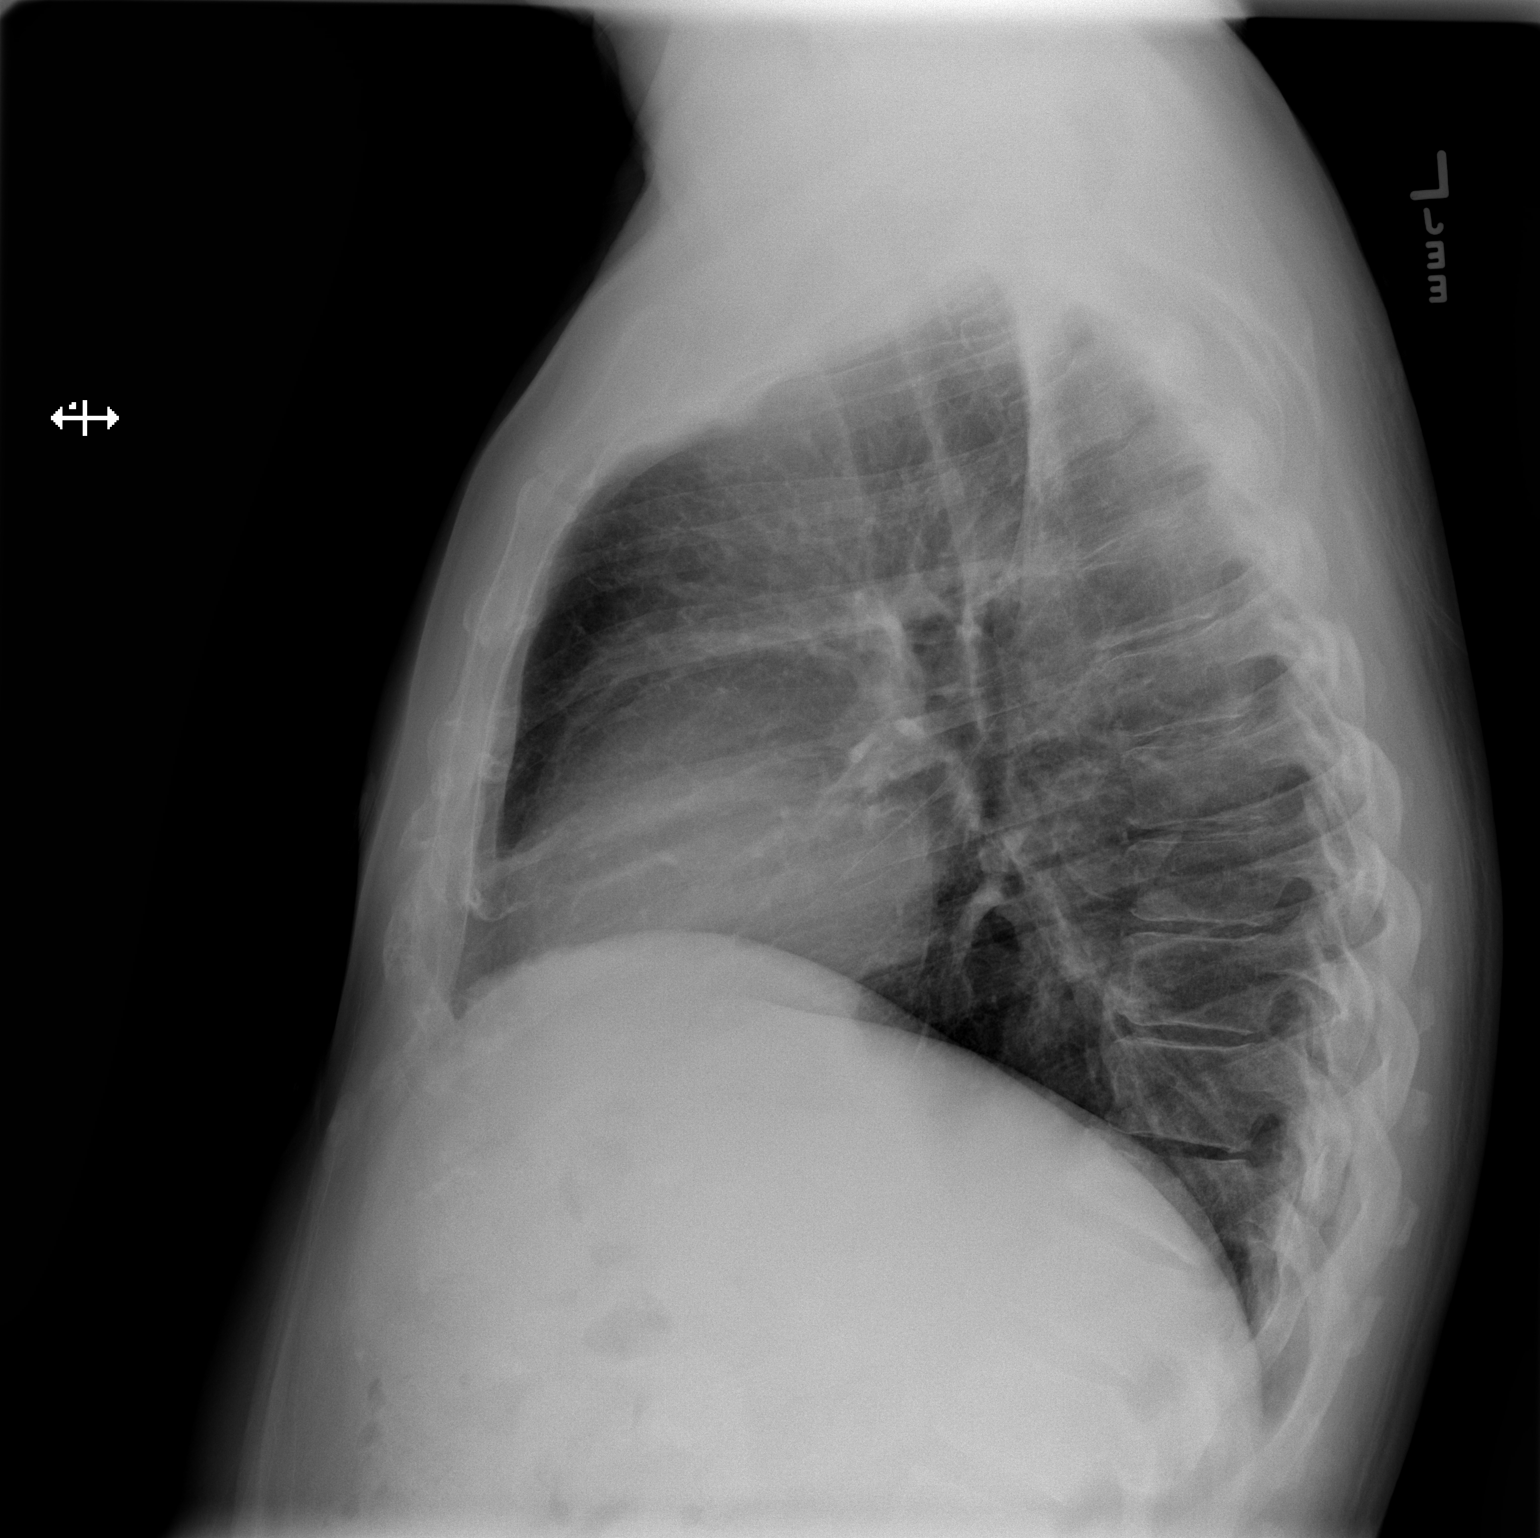

[2 of 2 positions shown; findings below may reference images not displayed]

FINDINGS: Heart size and pulmonary vascularity are normal and the lungs are
clear. No acute osseous abnormality. Old compression fracture in the
lower thoracic spine, stable.
IMPRESSION: No acute abnormality.

## 2016-04-16 IMAGING — CT CT HEAD W/O CM
1 series · 16 of 30 positions shown, 20 images · non-contrast
Comparison: CT scan dated 10/28/2005

CLINICAL DATA: Weakness. The patient has fallen twice and struck
his head. No loss of consciousness.

EXAM:
CT HEAD WITHOUT CONTRAST
TECHNIQUE: Contiguous axial images were obtained from the base of the skull
through the vertex without intravenous contrast.

[Series 3: head 5.0 h30s · axial · 0.44mm/px · z∈[-352,-202]mm · 16 of 34 slices shown, 20 images]
[im 2/34  brain]
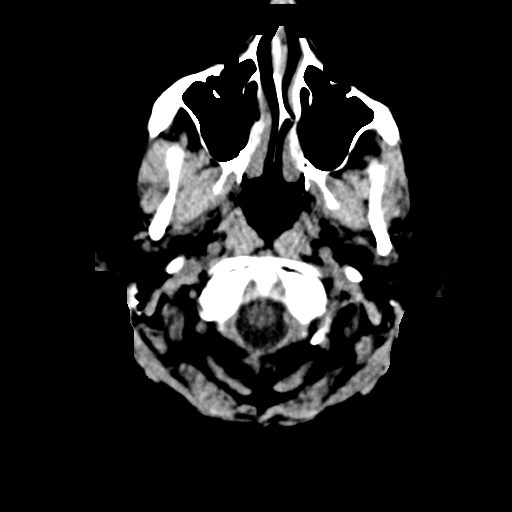
[im 2/34  bone]
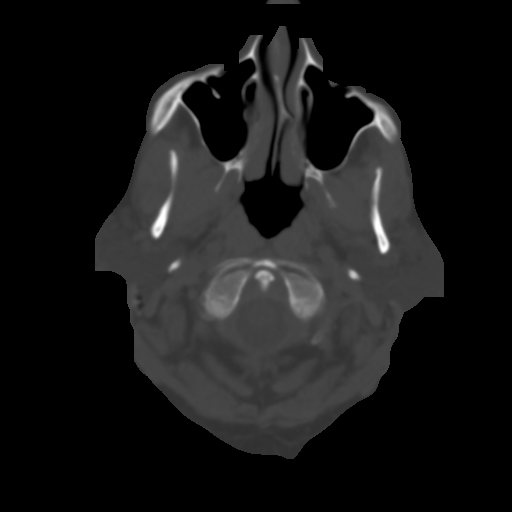
[im 4/34  brain]
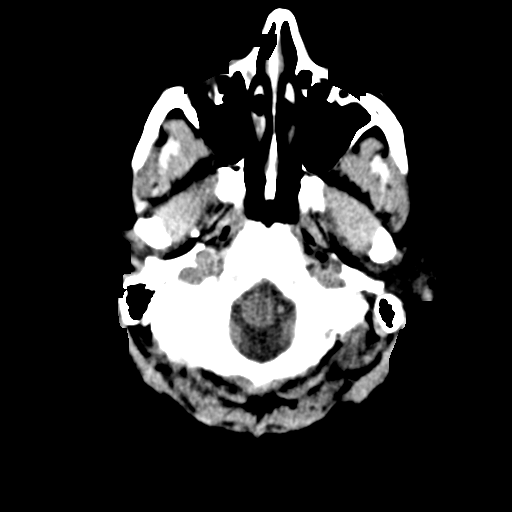
[im 6/34  brain]
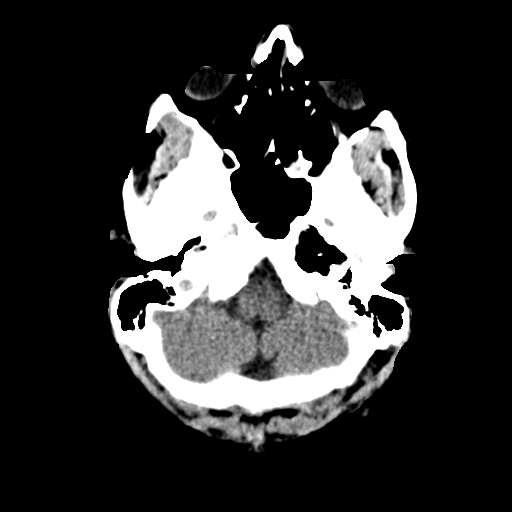
[im 8/34  brain]
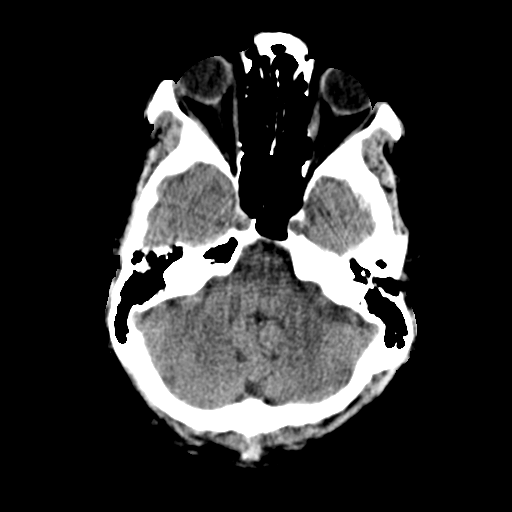
[im 10/34  brain]
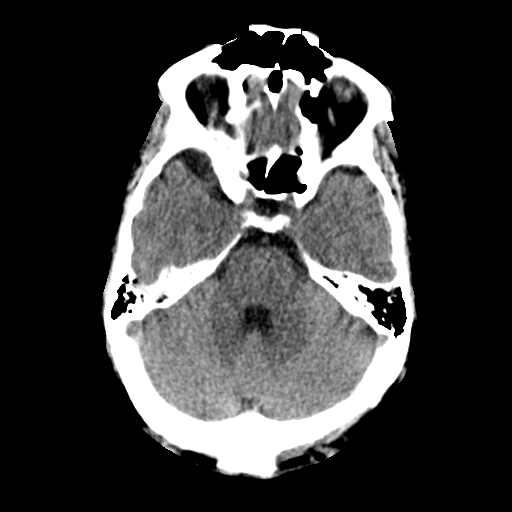
[im 10/34  bone]
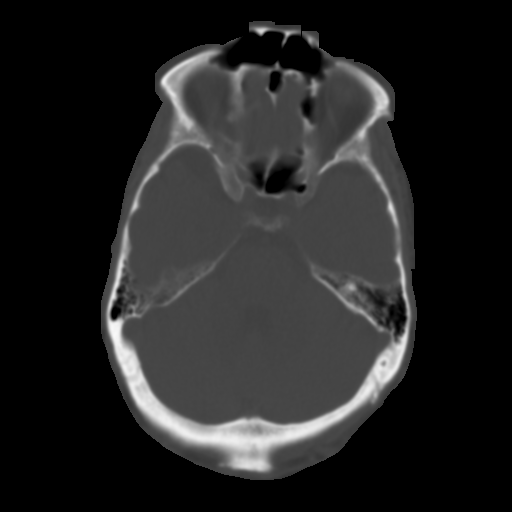
[im 12/34  brain]
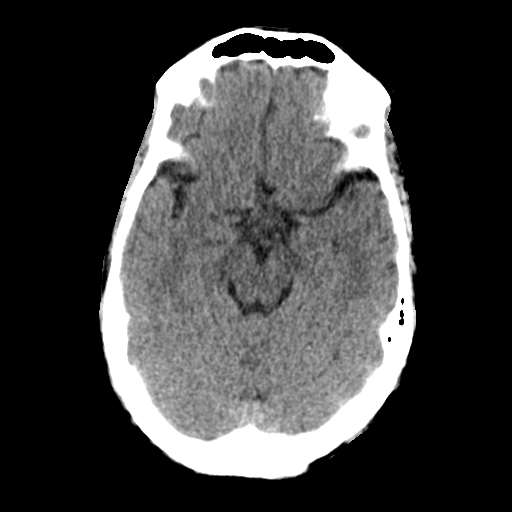
[im 14/34  brain]
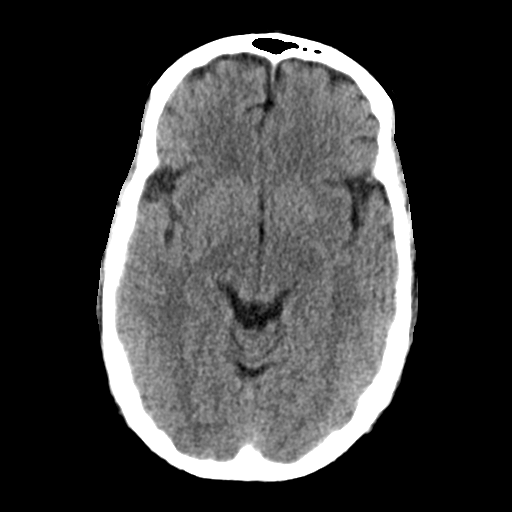
[im 16/34  brain]
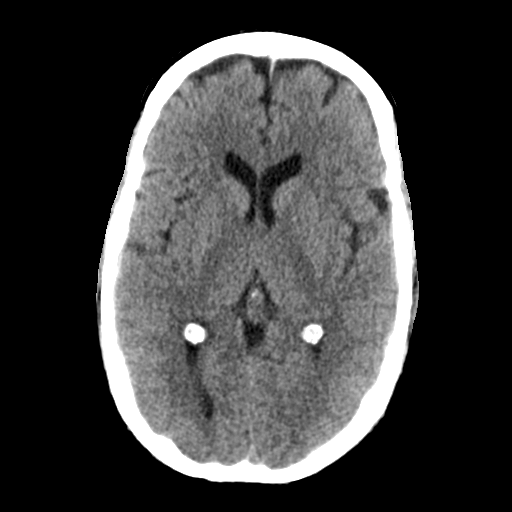
[im 18/34  brain]
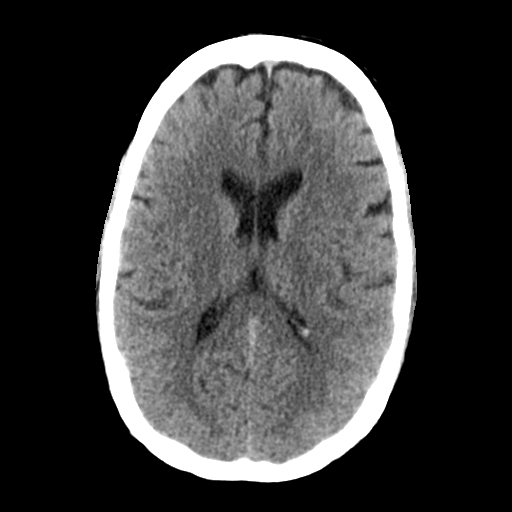
[im 18/34  bone]
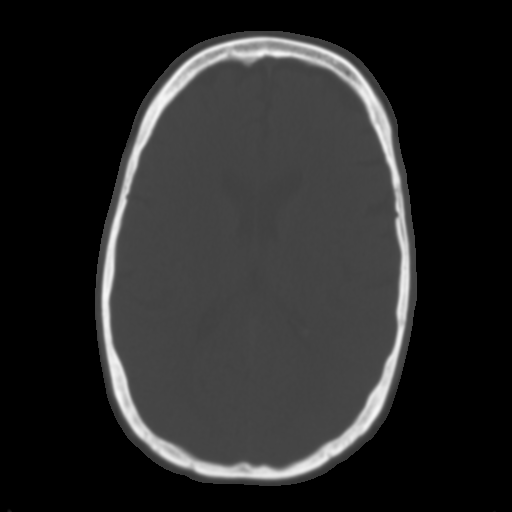
[im 20/34  brain]
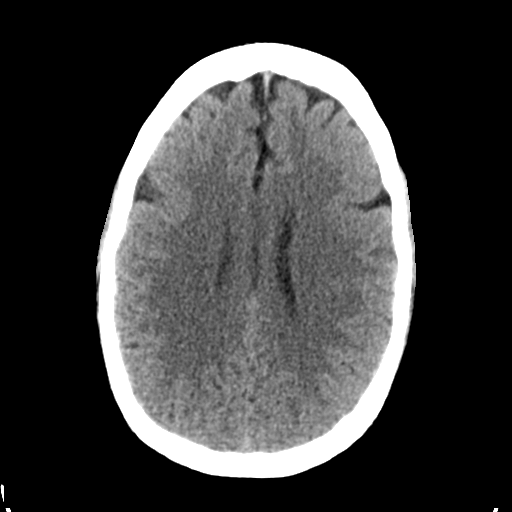
[im 22/34  brain]
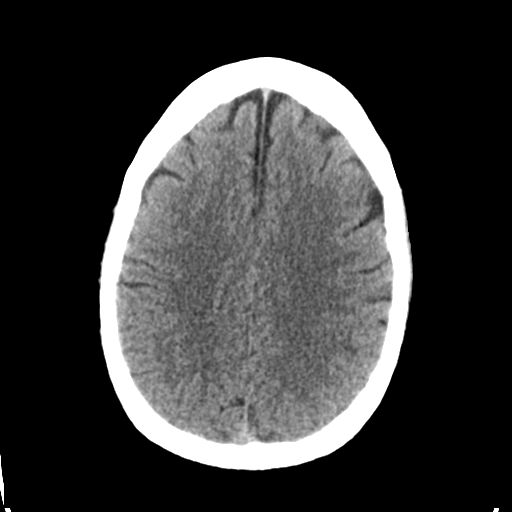
[im 24/34  brain]
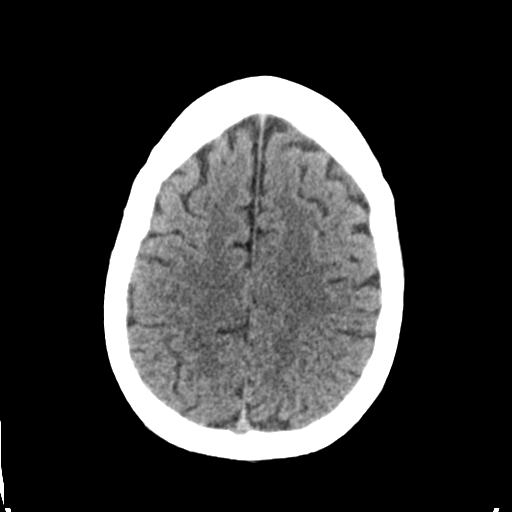
[im 26/34  brain]
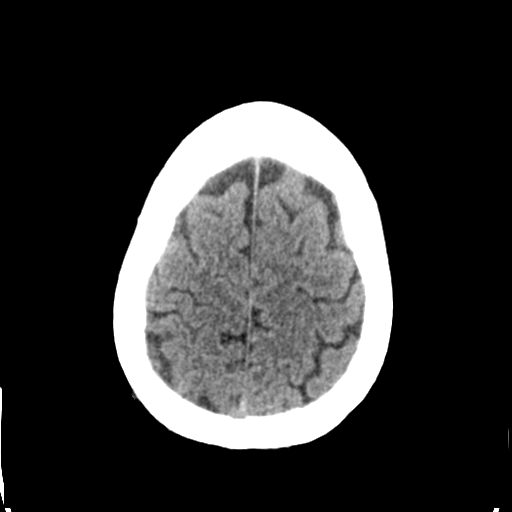
[im 26/34  bone]
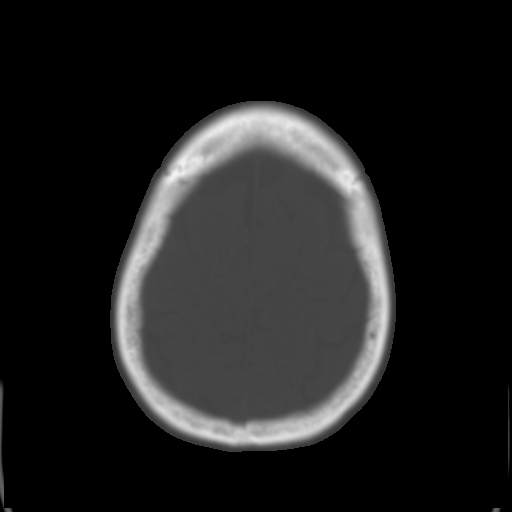
[im 28/34  brain]
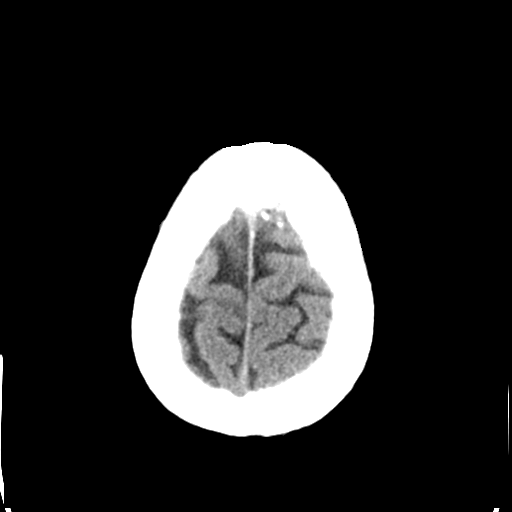
[im 30/34  brain]
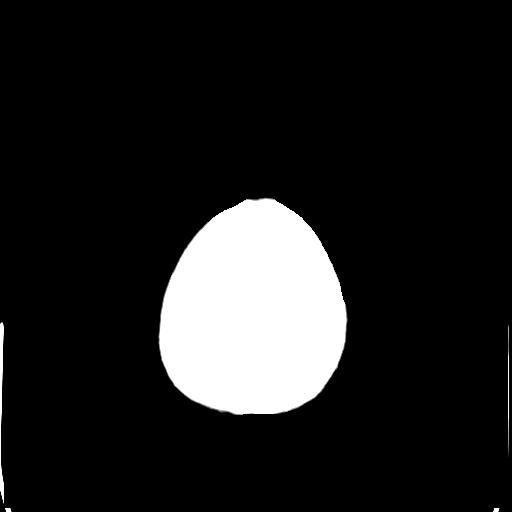
[im 32/34  brain]
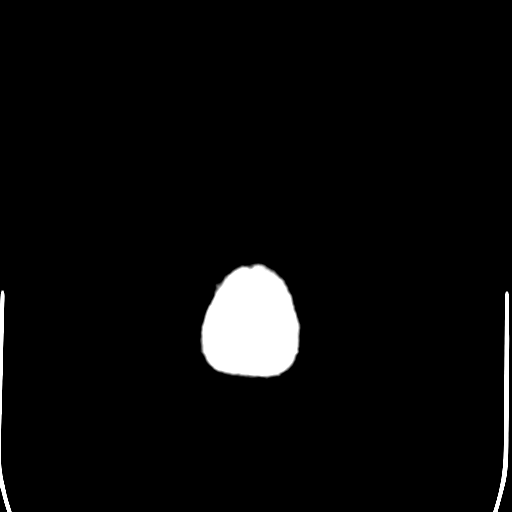

[16 of 30 positions shown; findings below may reference images not displayed]

FINDINGS: No mass lesion. No midline shift. No acute hemorrhage or hematoma.
No extra-axial fluid collections. No evidence of acute infarction.
Brain parenchyma is normal. Osseous structures are normal.
IMPRESSION: Normal exam.

## 2016-06-15 ENCOUNTER — Other Ambulatory Visit (HOSPITAL_COMMUNITY): Payer: Self-pay | Admitting: Urology

## 2016-06-15 DIAGNOSIS — D090 Carcinoma in situ of bladder: Secondary | ICD-10-CM

## 2016-07-05 ENCOUNTER — Encounter (HOSPITAL_COMMUNITY): Payer: Self-pay

## 2016-07-05 ENCOUNTER — Ambulatory Visit (HOSPITAL_COMMUNITY)
Admission: RE | Admit: 2016-07-05 | Discharge: 2016-07-05 | Disposition: A | Discharge status: Home / Self Care | Payer: BC Managed Care – PPO | Source: Ambulatory Visit | Attending: Urology | Admitting: Urology

## 2016-07-05 ENCOUNTER — Ambulatory Visit (HOSPITAL_COMMUNITY): Payer: BC Managed Care – PPO

## 2016-07-05 ENCOUNTER — Other Ambulatory Visit (HOSPITAL_COMMUNITY): Payer: Self-pay | Admitting: Urology

## 2016-07-05 DIAGNOSIS — Z9079 Acquired absence of other genital organ(s): Secondary | ICD-10-CM | POA: Diagnosis not present

## 2016-07-05 DIAGNOSIS — D090 Carcinoma in situ of bladder: Secondary | ICD-10-CM

## 2016-07-05 DIAGNOSIS — Z906 Acquired absence of other parts of urinary tract: Secondary | ICD-10-CM | POA: Diagnosis not present

## 2016-07-05 DIAGNOSIS — N2889 Other specified disorders of kidney and ureter: Secondary | ICD-10-CM | POA: Diagnosis not present

## 2016-07-12 ENCOUNTER — Other Ambulatory Visit (HOSPITAL_COMMUNITY): Payer: Self-pay | Admitting: Urology

## 2016-07-12 DIAGNOSIS — C652 Malignant neoplasm of left renal pelvis: Secondary | ICD-10-CM

## 2016-07-15 ENCOUNTER — Other Ambulatory Visit: Payer: Self-pay | Admitting: Radiology

## 2016-07-16 ENCOUNTER — Ambulatory Visit (HOSPITAL_COMMUNITY)
Admission: RE | Admit: 2016-07-16 | Discharge: 2016-07-16 | Disposition: A | Discharge status: Home / Self Care | Payer: BC Managed Care – PPO | Source: Ambulatory Visit | Attending: Urology | Admitting: Urology

## 2016-07-16 ENCOUNTER — Other Ambulatory Visit: Payer: Self-pay | Admitting: Radiology

## 2016-07-16 ENCOUNTER — Encounter (HOSPITAL_COMMUNITY): Payer: Self-pay | Admitting: Interventional Radiology

## 2016-07-16 DIAGNOSIS — C679 Malignant neoplasm of bladder, unspecified: Secondary | ICD-10-CM | POA: Insufficient documentation

## 2016-07-16 DIAGNOSIS — K219 Gastro-esophageal reflux disease without esophagitis: Secondary | ICD-10-CM | POA: Insufficient documentation

## 2016-07-16 DIAGNOSIS — N186 End stage renal disease: Secondary | ICD-10-CM | POA: Diagnosis not present

## 2016-07-16 DIAGNOSIS — F419 Anxiety disorder, unspecified: Secondary | ICD-10-CM | POA: Diagnosis not present

## 2016-07-16 DIAGNOSIS — C652 Malignant neoplasm of left renal pelvis: Secondary | ICD-10-CM

## 2016-07-16 DIAGNOSIS — F329 Major depressive disorder, single episode, unspecified: Secondary | ICD-10-CM | POA: Insufficient documentation

## 2016-07-16 DIAGNOSIS — D631 Anemia in chronic kidney disease: Secondary | ICD-10-CM | POA: Insufficient documentation

## 2016-07-16 DIAGNOSIS — E1122 Type 2 diabetes mellitus with diabetic chronic kidney disease: Secondary | ICD-10-CM | POA: Diagnosis not present

## 2016-07-16 DIAGNOSIS — Z87891 Personal history of nicotine dependence: Secondary | ICD-10-CM | POA: Insufficient documentation

## 2016-07-16 DIAGNOSIS — M109 Gout, unspecified: Secondary | ICD-10-CM | POA: Insufficient documentation

## 2016-07-16 DIAGNOSIS — Z906 Acquired absence of other parts of urinary tract: Secondary | ICD-10-CM | POA: Insufficient documentation

## 2016-07-16 DIAGNOSIS — I120 Hypertensive chronic kidney disease with stage 5 chronic kidney disease or end stage renal disease: Secondary | ICD-10-CM | POA: Insufficient documentation

## 2016-07-16 DIAGNOSIS — Z992 Dependence on renal dialysis: Secondary | ICD-10-CM | POA: Diagnosis not present

## 2016-07-16 HISTORY — PX: IR NEPHROSTOMY PLACEMENT LEFT: IMG6063

## 2016-07-16 LAB — BASIC METABOLIC PANEL
ANION GAP: 10 (ref 5–15)
BUN: 35 mg/dL — ABNORMAL HIGH (ref 6–20)
CALCIUM: 10 mg/dL (ref 8.9–10.3)
CO2: 22 mmol/L (ref 22–32)
Chloride: 110 mmol/L (ref 101–111)
Creatinine, Ser: 2.97 mg/dL — ABNORMAL HIGH (ref 0.61–1.24)
GFR calc Af Amer: 25 mL/min — ABNORMAL LOW (ref >60)
GFR calc non Af Amer: 22 mL/min — ABNORMAL LOW (ref >60)
GLUCOSE: 111 mg/dL — AB (ref 65–99)
Potassium: 4.2 mmol/L (ref 3.5–5.1)
Sodium: 142 mmol/L (ref 135–145)

## 2016-07-16 LAB — CBC
HCT: 34.4 % — ABNORMAL LOW (ref 39.0–52.0)
HEMOGLOBIN: 11.4 g/dL — AB (ref 13.0–17.0)
MCH: 28.9 pg (ref 26.0–34.0)
MCHC: 33.1 g/dL (ref 30.0–36.0)
MCV: 87.3 fL (ref 78.0–100.0)
Platelets: 214 10*3/uL (ref 150–400)
RBC: 3.94 MIL/uL — ABNORMAL LOW (ref 4.22–5.81)
RDW: 13.4 % (ref 11.5–15.5)
WBC: 7.4 10*3/uL (ref 4.0–10.5)

## 2016-07-16 LAB — APTT: aPTT: 29 seconds (ref 24–36)

## 2016-07-16 LAB — PROTIME-INR
INR: 0.99
PROTHROMBIN TIME: 13.1 s (ref 11.4–15.2)

## 2016-07-16 MED ORDER — SODIUM CHLORIDE 0.9 % IV SOLN: INTRAVENOUS | Status: DC

## 2016-07-16 MED ORDER — LIDOCAINE HCL (PF) 1 % IJ SOLN
INTRAMUSCULAR | Status: AC
  Filled 2016-07-16: qty 30

## 2016-07-16 MED ORDER — MIDAZOLAM HCL 2 MG/2ML IJ SOLN
INTRAMUSCULAR | Status: DC | PRN
  Administered 2016-07-16: 1 mg via INTRAVENOUS

## 2016-07-16 MED ORDER — MIDAZOLAM HCL 2 MG/2ML IJ SOLN
INTRAMUSCULAR | Status: AC
  Filled 2016-07-16: qty 2

## 2016-07-16 MED ORDER — IOPAMIDOL (ISOVUE-300) INJECTION 61%
INTRAVENOUS | Status: AC
  Administered 2016-07-16: 5 mL
  Filled 2016-07-16: qty 50

## 2016-07-16 MED ORDER — CIPROFLOXACIN IN D5W 400 MG/200ML IV SOLN
400.0000 mg | INTRAVENOUS | Status: AC
  Administered 2016-07-16: 400 mg via INTRAVENOUS

## 2016-07-16 MED ORDER — LORAZEPAM 2 MG/ML IJ SOLN
INTRAMUSCULAR | Status: DC | PRN
  Administered 2016-07-16: 1 mg via INTRAVENOUS

## 2016-07-16 MED ORDER — FENTANYL CITRATE (PF) 100 MCG/2ML IJ SOLN
INTRAMUSCULAR | Status: AC
  Filled 2016-07-16: qty 2

## 2016-07-16 MED ORDER — FENTANYL CITRATE (PF) 100 MCG/2ML IJ SOLN
INTRAMUSCULAR | Status: DC | PRN
  Administered 2016-07-16: 25 ug via INTRAVENOUS
  Administered 2016-07-16: 50 ug via INTRAVENOUS
  Administered 2016-07-16: 25 ug via INTRAVENOUS

## 2016-07-16 NOTE — Procedures (Signed)
Pre Procedure Dx: Bladder cancer, concern for recurrence Post Procedural Dx: Same  Attempted, though unsuccessful [place] and fluoroscopic guided placement of a left sided PCN secondary to lack of any hydronephrosis  EBL: Minimal  Complications: None immediate.  Ronny Bacon, MD Pager #: [phone]

## 2016-07-16 NOTE — Discharge Instructions (Signed)
Percutaneous Nephrostomy Attempt, Care After This sheet gives you information about how to care for yourself after your procedure. Your health care provider may also give you more specific instructions. If you have problems or questions, contact your health care provider. What can I expect after the procedure? After the procedure, it is common to have:  Some soreness where the nephrostomy tube attempted inserted (tube insertion site).  Blood-tinged drainage from the puncture for the first 24 hours. Follow these instructions at home: Activity   Return to your normal activities as told by your health care provider. Ask your health care provider what activities are safe for you.  Avoid activities that may cause the nephrostomy tubing to bend.  Do not take baths, swim, or use a hot tub until your health care provider approves. Ask your health care provider if you can take showers. Cover the dressing with a watertight covering when you take a shower.  Donot drive for 24 hours if you were given a medicine to help you relax (sedative). Care of the insertion site   Follow instructions from your health care provider about how to take care of your tube insertion site. Make sure you:  Wash your hands with soap and water before you change your bandage (dressing). If soap and water are not available, use hand sanitizer.  Change your dressing as told by your health care provider. Be careful not to pull on the site while removing the dressing.  When you change the dressing, wash the skin around the site, rinse well, and pat the skin dry.  Check the tube insertion area every day for signs of infection. Check for:  More redness, swelling, or pain.  More fluid or blood.  Warmth.  Pus or a bad smell. General instructions   Take over-the-counter and prescription medicines only as told by your health care provider.  Keep all follow-up visits as told by your health care provider. This is  important. Contact a health care provider if:  You have problems with any of the valves or tubing.  You have persistent pain or soreness in your back.  You have more redness, swelling, or pain around your tube insertion site.  You have more fluid or blood coming from your  insertion site.  Your tube insertion site feels warm to the touch.  You have pus or a bad smell coming from your insertion site.  You have increased urine output or you feel burning when urinating. Get help right away if:  You have pain in your abdomen during the first week.  You have chest pain or have trouble breathing.  You have a new appearance of blood in your urine.  You have a fever or chills.  You have back pain that is not relieved by your medicine.  You have decreased urine output.  Your nephrostomy tube comes out. This information is not intended to replace advice given to you by your health care provider. Make sure you discuss any questions you have with your health care provider. Document Released: 11/13/2003 Document Revised: 01/02/2016 Document Reviewed: 01/02/2016 Elsevier Interactive Patient Education  2017 [employer].

## 2016-07-16 NOTE — Sedation Documentation (Signed)
Patient is resting comfortably. 

## 2016-07-16 NOTE — H&P (Signed)
Chief Complaint: Patient was seen in consultation today for bladder cancer with neobladder  Referring Physician(s):  Borden,Lester  Supervising Physician: Sandi Mariscal  Patient Status: [facility] - Out-pt  History of Present Illness: [name] [name] is a 56 y.o. male with past medical history of DM2, GERD, HTN, and bladder cancer s/p radical cystectomy with resulting neobladder and renal failure.   Patient followed by Urology.  MRI 07/05/16 showed: New diffuse irregular enhancing soft tissue density involving the left renal pelvis, lower pole collecting system, and proximal left ureter, with associated left renal caliectasis. This is suspicious for urothelial carcinoma.  Previous cystoprostatectomy and pelvic neobladder. Multiple sub-cm abdominal retroperitoneal lymph nodes. Metastatic disease cannot be excluded.  IR consulted for possible left percutaneous nephrostomy tube.   Patient has been NPO.  He does not take blood thinners.  He has been in his usual state of health.   Past Medical History:  Diagnosis Date  . Acute renal injury ([place]) 11/2013  . Anemia   . Anxiety   . Bladder cancer ([place]) 2013  . Complication of anesthesia    agitation w/awakening in 9/13,OK 01/24/12  . Depression   . Diabetes mellitus type 2, diet-controlled ([place]) PT STOPPED TAKING METFORMIN--  HAS BEEN WATCHING DIET, EXERCISING AND LOSING WT  . Frequency of urination   . GERD (gastroesophageal reflux disease)   . Gout    recent flair -bilateral feet--  STABLE  . Hypertension   . Kidney failure   . Nocturia   . Urgency incontinence     Past Surgical History:  Procedure Laterality Date  . AV FISTULA PLACEMENT Left 05/31/2014   Procedure: LEFT RADIOCEPHALIC ARTERIOVENOUS (AV) FISTULA CREATION ;  Surgeon: Mal Misty, MD;  Location: [facility];  Service: Vascular;  Laterality: Left;  . BACK SURGERY    . BLADDER SURGERY  04/26/2013   at the cancer treatment center of [place] in  [place]    .  CYSTOSCOPY W/ RETROGRADES Bilateral 07/10/2012   Procedure: CYSTOSCOPY WITH RETROGRADE PYELOGRAM;  Surgeon: Molli Hazard, MD;  Location: [facility];  Service: Urology;  Laterality: Bilateral;  . CYSTOSCOPY WITH URETHRAL DILATATION  01/24/2012   Procedure: CYSTOSCOPY WITH URETHRAL DILATATION;  Surgeon: Molli Hazard, MD;  Location: [facility];  Service: Urology;  Laterality: N/A;  . LUMBAR LAMINECTOMY  06-02-2005   W/ RESECTION NERVE ROOT  L4  -  L5  . NEPHROSTOMY TUBE PLACEMENT ([place] HX)  2015   right   . TRANSURETHRAL RESECTION OF BLADDER TUMOR  12/15/2011   Procedure: TRANSURETHRAL RESECTION OF BLADDER TUMOR (TURBT);  Surgeon: Molli Hazard, MD;  Location: [facility];  Service: Urology;  Laterality: N/A;  2 HRS   . TRANSURETHRAL RESECTION OF BLADDER TUMOR  01/24/2012   Procedure: TRANSURETHRAL RESECTION OF BLADDER TUMOR (TURBT);  Surgeon: Molli Hazard, MD;  Location: [facility];  Service: Urology;  Laterality: N/A;  90 MIN   . TRANSURETHRAL RESECTION OF BLADDER TUMOR N/A 07/10/2012   Procedure: TRANSURETHRAL RESECTION OF BLADDER TUMOR (TURBT);  Surgeon: Molli Hazard, MD;  Location: [facility];  Service: Urology;  Laterality: N/A;  . ulner artery repair Right 2009   rt -trauma /thrombectomy,repair    Allergies: Lisinopril  Medications: Prior to Admission medications   Medication Sig Start Date End Date Taking? Authorizing Provider  citalopram (CELEXA) 40 MG tablet Take 40 mg by mouth every morning.    Yes Historical Provider, MD  pantoprazole (PROTONIX) 40 MG tablet Take 40 mg by mouth 2 (two) times daily.  05/11/14  Yes Historical Provider, MD  sodium bicarbonate 650 MG tablet Take 2 tablets (1,300 mg total) by mouth 3 (three) times daily. Patient taking differently: Take 1,950 mg by mouth 2 (two) times daily.  10/23/14  Yes Robbie Lis, MD     Family History    Problem Relation Age of Onset  . Diabetes Mellitus II Other   . Hypertension Other   . Bladder Cancer Neg Hx     Social History   Social History  . Marital status: Married    Spouse name: N/A  . Number of children: N/A  . Years of education: N/A   Social History Main Topics  . Smoking status: Former Smoker    Packs/day: 1.00    Years: 16.00    Types: Cigarettes    Quit date: 01/20/1992  . Smokeless tobacco: Former Systems developer    Types: [place] date: 01/20/1992  . Alcohol use No  . Drug use: No  . Sexual activity: Not Currently   Other Topics Concern  . Not on file   Social History Narrative  . No narrative on file    Review of Systems  Constitutional: Negative for fatigue and fever.  Respiratory: Negative for cough and shortness of breath.   Cardiovascular: Negative for chest pain.  Genitourinary: Negative for difficulty urinating.  Psychiatric/Behavioral: Negative for behavioral problems and confusion.    Vital Signs: BP (!) 145/81   Pulse 74   Temp 98.6 F (37 C)   Resp 20   Ht 5\' 10"  (1.778 m)   Wt 200 lb (90.7 kg)   SpO2 99%   BMI 28.70 kg/m   Physical Exam  Constitutional: He is oriented to person, place, and time. He appears well-developed.  Cardiovascular: Normal rate, regular rhythm, normal heart sounds and intact distal pulses.   Pulmonary/Chest: Effort normal and breath sounds normal. No respiratory distress.  Musculoskeletal:  L lower arm fistula  Neurological: He is alert and oriented to person, place, and time.  Skin: Skin is warm and dry.  Psychiatric: He has a normal mood and affect. His behavior is normal. Judgment and thought content normal.  Nursing note and vitals reviewed.   Mallampati Score:  MD Evaluation Airway: WNL Heart: WNL Abdomen: WNL Chest/ Lungs: WNL ASA  Classification: 3 Mallampati/Airway Score: Two  Imaging: Dg Chest 2 View  Result Date: 07/05/2016 CLINICAL DATA:  History of bladder carcinoma in situ. EXAM:  CHEST  2 VIEW COMPARISON:  PA and lateral chest 06/18/2015 and 10/19/2014. FINDINGS: The lungs are clear. Heart size is normal. No pneumothorax or pleural effusion. No acute bony abnormality. Mild compression fracture deformity at the thoracolumbar junction is unchanged. IMPRESSION: Negative for metastatic or acute disease. Electronically Signed   By: Inge Rise M.D.   On: 07/05/2016 10:34   Mr Pelvis Wo Contrast  Result Date: 07/05/2016 CLINICAL DATA:  Followup bladder carcinoma. Previous cystectomy and neobladder formation. Chronic renal failure on dialysis. EXAM: MRI ABDOMEN AND PELVIS WITHOUT CONTRAST TECHNIQUE: Multiplanar multisequence MR imaging of the abdomen and pelvis was performed. No intravenous contrast was administered. COMPARISON:  CT on 10/20/2015 FINDINGS: COMBINED FINDINGS FOR BOTH MR ABDOMEN AND PELVIS Lower chest: No acute findings. Hepatobiliary: No masses visualized on this unenhanced exam. Gallbladder is unremarkable. Pancreas: No mass or inflammatory process visualized on this unenhanced exam. Spleen:  Within normal limits in size. Adrenals/Urinary tract: Previous cystoprostatectomy  with pelvic neobladder again noted. New diffuse irregular enhancing soft tissue density is seen involving the left renal pelvis, proximal left ureter, and lower pole intrarenal collecting system, with associated left renal pelviectasis. This is suspicious for urothelial carcinoma. Remainder of the ureters and pelvic neobladder are unremarkable in appearance. No evidence of right renal mass or hydronephrosis. Stomach/Bowel: No evidence of obstruction, inflammatory process, or abnormal fluid collections. Vascular/Lymphatic: Sub-cm retroperitoneal lymph nodes are seen within the left paraaortic and aortocaval spaces. No pathologically enlarged lymph nodes identified. No evidence of abdominal aortic aneurysm. Reproductive:  No mass or other significant abnormality. Other:  None. Musculoskeletal:  No  suspicious bone lesions identified. IMPRESSION: New diffuse irregular enhancing soft tissue density involving the left renal pelvis, lower pole collecting system, and proximal left ureter, with associated left renal caliectasis. This is suspicious for urothelial carcinoma. Previous cystoprostatectomy and pelvic neobladder. Multiple sub-cm abdominal retroperitoneal lymph nodes. Metastatic disease cannot be excluded. These results will be called to the ordering clinician or representative by the Radiologist Assistant, and communication documented in the PACS or zVision Dashboard. Electronically Signed   By: Earle Gell M.D.   On: 07/05/2016 12:45   Mr Abdomen Wo Contrast  Result Date: 07/05/2016 CLINICAL DATA:  Followup bladder carcinoma. Previous cystectomy and neobladder formation. Chronic renal failure on dialysis. EXAM: MRI ABDOMEN AND PELVIS WITHOUT CONTRAST TECHNIQUE: Multiplanar multisequence MR imaging of the abdomen and pelvis was performed. No intravenous contrast was administered. COMPARISON:  CT on 10/20/2015 FINDINGS: COMBINED FINDINGS FOR BOTH MR ABDOMEN AND PELVIS Lower chest: No acute findings. Hepatobiliary: No masses visualized on this unenhanced exam. Gallbladder is unremarkable. Pancreas: No mass or inflammatory process visualized on this unenhanced exam. Spleen:  Within normal limits in size. Adrenals/Urinary tract: Previous cystoprostatectomy with pelvic neobladder again noted. New diffuse irregular enhancing soft tissue density is seen involving the left renal pelvis, proximal left ureter, and lower pole intrarenal collecting system, with associated left renal pelviectasis. This is suspicious for urothelial carcinoma. Remainder of the ureters and pelvic neobladder are unremarkable in appearance. No evidence of right renal mass or hydronephrosis. Stomach/Bowel: No evidence of obstruction, inflammatory process, or abnormal fluid collections. Vascular/Lymphatic: Sub-cm retroperitoneal lymph  nodes are seen within the left paraaortic and aortocaval spaces. No pathologically enlarged lymph nodes identified. No evidence of abdominal aortic aneurysm. Reproductive:  No mass or other significant abnormality. Other:  None. Musculoskeletal:  No suspicious bone lesions identified. IMPRESSION: New diffuse irregular enhancing soft tissue density involving the left renal pelvis, lower pole collecting system, and proximal left ureter, with associated left renal caliectasis. This is suspicious for urothelial carcinoma. Previous cystoprostatectomy and pelvic neobladder. Multiple sub-cm abdominal retroperitoneal lymph nodes. Metastatic disease cannot be excluded. These results will be called to the ordering clinician or representative by the Radiologist Assistant, and communication documented in the PACS or zVision Dashboard. Electronically Signed   By: Earle Gell M.D.   On: 07/05/2016 12:45    Labs:  CBC:  Recent Labs  09/29/15 1145 10/20/15 0443 10/21/15 0401 07/16/16 0935  WBC  --  15.6* 11.3* 7.4  HGB 11.2* 11.1* 10.6* 11.4*  HCT  --  34.0* 32.4* 34.4*  PLT  --  257 224 214    COAGS:  Recent Labs  10/20/15 0924  INR 1.17    BMP:  Recent Labs  09/29/15 1143 10/20/15 0443 10/21/15 0401 07/16/16 0935  NA 136 135 134* 142  K 3.8 3.9 4.0 4.2  CL 109 110 110 110  CO2 20*  18* 18* 22  GLUCOSE 163* 227* 139* 111*  BUN 35* 48* 45* 35*  CALCIUM 9.2  8.9 9.0 8.7* 10.0  CREATININE 2.99* 3.09* 3.02* 2.97*  GFRNONAA 22* 21* 21* 22*  GFRAA 25* 24* 25* 25*    LIVER FUNCTION TESTS:  Recent Labs  07/28/15 0852 09/29/15 1143  ALBUMIN 3.5 3.7    TUMOR MARKERS: No results for input(s): AFPTM, CEA, CA199, CHROMGRNA in the last 8760 hours.  Assessment and Plan: Patient with past medical history of DM2, GERD, HTN, and bladder cancer s/p radical cystectomy with resulting neobladder and renal failure presents for possible left nephrostogram and percutaneous nephrostomy tube  placement at the request of Dr. Alinda Money. Patient presents for procedure today.  He has been NPO.  He does not take blood thinners.  Dr. Pascal Lux has discussed case with Dr. Alinda Money. Risks and benefits discussed with the patient including, but not limited to infection, bleeding, significant bleeding causing loss or decrease in renal function or damage to adjacent structures.  All of the patient's questions were answered, patient is agreeable to proceed. Consent signed and in chart.   Thank you for this interesting consult.  I greatly enjoyed meeting [name] [name] and look forward to participating in their care.  A copy of this report was sent to the requesting provider on this date.  Electronically Signed: Docia Barrier 07/16/2016, 10:35 AM   I spent a total of 40 Minutes    in face to face in clinical consultation, greater than 50% of which was counseling/coordinating care for bladder cancer.

## 2016-08-11 DIAGNOSIS — N186 End stage renal disease: Secondary | ICD-10-CM

## 2016-08-11 HISTORY — DX: End stage renal disease: N18.6

## 2016-08-12 DIAGNOSIS — E875 Hyperkalemia: Secondary | ICD-10-CM

## 2016-08-12 DIAGNOSIS — N2581 Secondary hyperparathyroidism of renal origin: Secondary | ICD-10-CM

## 2016-08-12 DIAGNOSIS — D631 Anemia in chronic kidney disease: Secondary | ICD-10-CM

## 2016-08-12 HISTORY — DX: Secondary hyperparathyroidism of renal origin: N25.81

## 2016-08-12 HISTORY — DX: Hyperkalemia: E87.5

## 2016-08-12 HISTORY — DX: Anemia in chronic kidney disease: D63.1

## 2016-08-26 ENCOUNTER — Encounter (HOSPITAL_COMMUNITY): Payer: Self-pay

## 2016-08-26 ENCOUNTER — Emergency Department (HOSPITAL_BASED_OUTPATIENT_CLINIC_OR_DEPARTMENT_OTHER)
Admit: 2016-08-26 | Discharge: 2016-08-26 | Disposition: A | Discharge status: Home / Self Care | Payer: BC Managed Care – PPO | Attending: Emergency Medicine | Admitting: Emergency Medicine

## 2016-08-26 ENCOUNTER — Emergency Department (HOSPITAL_COMMUNITY)
Admission: EM | Admit: 2016-08-26 | Discharge: 2016-08-26 | Disposition: A | Discharge status: Home / Self Care | Payer: BC Managed Care – PPO | Attending: Emergency Medicine | Admitting: Emergency Medicine

## 2016-08-26 DIAGNOSIS — M79651 Pain in right thigh: Secondary | ICD-10-CM | POA: Insufficient documentation

## 2016-08-26 DIAGNOSIS — M79609 Pain in unspecified limb: Secondary | ICD-10-CM

## 2016-08-26 DIAGNOSIS — Z8551 Personal history of malignant neoplasm of bladder: Secondary | ICD-10-CM | POA: Diagnosis not present

## 2016-08-26 DIAGNOSIS — M109 Gout, unspecified: Secondary | ICD-10-CM | POA: Diagnosis present

## 2016-08-26 DIAGNOSIS — E1122 Type 2 diabetes mellitus with diabetic chronic kidney disease: Secondary | ICD-10-CM | POA: Insufficient documentation

## 2016-08-26 DIAGNOSIS — N184 Chronic kidney disease, stage 4 (severe): Secondary | ICD-10-CM | POA: Diagnosis not present

## 2016-08-26 DIAGNOSIS — I129 Hypertensive chronic kidney disease with stage 1 through stage 4 chronic kidney disease, or unspecified chronic kidney disease: Secondary | ICD-10-CM | POA: Insufficient documentation

## 2016-08-26 DIAGNOSIS — M79652 Pain in left thigh: Secondary | ICD-10-CM | POA: Insufficient documentation

## 2016-08-26 DIAGNOSIS — Z87891 Personal history of nicotine dependence: Secondary | ICD-10-CM | POA: Diagnosis not present

## 2016-08-26 DIAGNOSIS — D689 Coagulation defect, unspecified: Secondary | ICD-10-CM

## 2016-08-26 HISTORY — DX: Coagulation defect, unspecified: D68.9

## 2016-08-26 LAB — COMPREHENSIVE METABOLIC PANEL
ALK PHOS: 57 U/L (ref 38–126)
ALT: 17 U/L (ref 17–63)
ANION GAP: 13 (ref 5–15)
AST: 22 U/L (ref 15–41)
Albumin: 4 g/dL (ref 3.5–5.0)
BILIRUBIN TOTAL: 0.4 mg/dL (ref 0.3–1.2)
BUN: 41 mg/dL — ABNORMAL HIGH (ref 6–20)
CALCIUM: 9.6 mg/dL (ref 8.9–10.3)
CO2: 24 mmol/L (ref 22–32)
CREATININE: 5.52 mg/dL — AB (ref 0.61–1.24)
Chloride: 101 mmol/L (ref 101–111)
GFR, EST AFRICAN AMERICAN: 12 mL/min — AB (ref >60)
GFR, EST NON AFRICAN AMERICAN: 10 mL/min — AB (ref >60)
Glucose, Bld: 86 mg/dL (ref 65–99)
Potassium: 4.1 mmol/L (ref 3.5–5.1)
SODIUM: 138 mmol/L (ref 135–145)
TOTAL PROTEIN: 7.8 g/dL (ref 6.5–8.1)

## 2016-08-26 LAB — CBC WITH DIFFERENTIAL/PLATELET
BASOS ABS: 0 10*3/uL (ref 0.0–0.1)
BASOS PCT: 0 %
EOS ABS: 0.2 10*3/uL (ref 0.0–0.7)
Eosinophils Relative: 2 %
HEMATOCRIT: 26.6 % — AB (ref 39.0–52.0)
HEMOGLOBIN: 8.9 g/dL — AB (ref 13.0–17.0)
Lymphocytes Relative: 20 %
Lymphs Abs: 1.9 10*3/uL (ref 0.7–4.0)
MCH: 29.8 pg (ref 26.0–34.0)
MCHC: 33.5 g/dL (ref 30.0–36.0)
MCV: 89 fL (ref 78.0–100.0)
Monocytes Absolute: 0.5 10*3/uL (ref 0.1–1.0)
Monocytes Relative: 6 %
NEUTROS ABS: 6.6 10*3/uL (ref 1.7–7.7)
NEUTROS PCT: 72 %
Platelets: 304 10*3/uL (ref 150–400)
RBC: 2.99 MIL/uL — ABNORMAL LOW (ref 4.22–5.81)
RDW: 12.7 % (ref 11.5–15.5)
WBC: 9.1 10*3/uL (ref 4.0–10.5)

## 2016-08-26 LAB — I-STAT CHEM 8, ED
BUN: 39 mg/dL — AB (ref 6–20)
Calcium, Ion: 1.16 mmol/L (ref 1.15–1.40)
Chloride: 100 mmol/L — ABNORMAL LOW (ref 101–111)
Creatinine, Ser: 5.9 mg/dL — ABNORMAL HIGH (ref 0.61–1.24)
Glucose, Bld: 81 mg/dL (ref 65–99)
HEMATOCRIT: 25 % — AB (ref 39.0–52.0)
HEMOGLOBIN: 8.5 g/dL — AB (ref 13.0–17.0)
POTASSIUM: 3.9 mmol/L (ref 3.5–5.1)
SODIUM: 137 mmol/L (ref 135–145)
TCO2: 26 mmol/L (ref 0–100)

## 2016-08-26 LAB — CK: Total CK: 64 U/L (ref 49–397)

## 2016-08-26 MED ORDER — OXYCODONE-ACETAMINOPHEN 5-325 MG PO TABS: 1.0000 | ORAL_TABLET | ORAL | 0 refills | Status: DC | PRN

## 2016-08-26 MED ORDER — HYDROMORPHONE HCL 1 MG/ML IJ SOLN
1.0000 mg | Freq: Once | INTRAMUSCULAR | Status: AC
  Administered 2016-08-26: 1 mg via INTRAVENOUS
  Filled 2016-08-26: qty 1

## 2016-08-26 MED ORDER — OXYCODONE-ACETAMINOPHEN 5-325 MG PO TABS
2.0000 | ORAL_TABLET | Freq: Once | ORAL | Status: AC
  Administered 2016-08-26: 2 via ORAL
  Filled 2016-08-26: qty 2

## 2016-08-26 MED ORDER — MORPHINE SULFATE (PF) 2 MG/ML IV SOLN
4.0000 mg | Freq: Once | INTRAVENOUS | Status: AC
  Administered 2016-08-26: 4 mg via INTRAVENOUS
  Filled 2016-08-26: qty 2

## 2016-08-26 NOTE — ED Provider Notes (Signed)
[facility] DEPT Provider Note   CSN: [id number] Arrival date & time: 08/26/16  0240     History   Chief Complaint Chief Complaint  Patient presents with  . Gout    HPI [name] [name] is a 56 y.o. male.  HPI    [name] [name] is a 56 y.o. male, with a history of Metastatic bladder cancer, DM, and nephrectomy, presenting to the ED with bilateral lower extremity pain worsening for the past several days. Endorses muscle cramps and a "deep pain" in his bilateral thighs. Pain is severe and constant. He states he thinks he may have gout in his feet, but the pain in his feet is "nothing compared to the pain the thighs." Has not tried any therapies. States he had his left kidney removed three weeks ago due to metastatic bladder cancer (performed at [place]). He states he is on a Tuesday, Thursday, and Saturday dialysis schedule. Last dialysis was Saturday, May 19.  Denies fever/chills, nausea/vomiting, chest pain, shortness of breath, or any other complaints.    Past Medical History:  Diagnosis Date  . Acute renal injury ([place]) 11/2013  . Anemia   . Anxiety   . Bladder cancer ([place]) 2013  . Complication of anesthesia    agitation w/awakening in 9/13,OK 01/24/12  . Depression   . Diabetes mellitus type 2, diet-controlled ([place]) PT STOPPED TAKING METFORMIN--  HAS BEEN WATCHING DIET, EXERCISING AND LOSING WT  . Frequency of urination   . GERD (gastroesophageal reflux disease)   . Gout    recent flair -bilateral feet--  STABLE  . Hypertension   . Kidney failure   . Nocturia   . Urgency incontinence     Patient Active Problem List   Diagnosis Date Noted  . Pyelonephritis 10/20/2015  . Hydronephrosis 10/20/2015  . E-coli UTI   . Anemia of chronic disease   . Acute encephalopathy 10/19/2014  . Altered mental status 10/19/2014  . Faintness   . Syncope 07/06/2014  . Syncope and collapse 07/05/2014  . CKD (chronic kidney disease), stage IV ([place])  07/05/2014  . Acute coccygeal pain 07/05/2014  . Occipital scalp laceration 07/05/2014  . Diabetes mellitus type 2, diet-controlled ([place]) 07/05/2014  . Protein calorie malnutrition ([place]) 11/29/2013  . AKI (acute kidney injury) ([place]) 10/30/2013  . Hypercalcemia 06/26/2013  . UTI (lower urinary tract infection) 06/25/2013  . Bladder cancer ([place]) 05/09/2013  . Major depressive disorder, single episode, severe ([place]) 09/03/2005    Past Surgical History:  Procedure Laterality Date  . AV FISTULA PLACEMENT Left 05/31/2014   Procedure: LEFT RADIOCEPHALIC ARTERIOVENOUS (AV) FISTULA CREATION ;  Surgeon: Mal Misty, MD;  Location: [facility];  Service: Vascular;  Laterality: Left;  . BACK SURGERY    . BLADDER SURGERY  04/26/2013   at the cancer treatment center of [place] in  [place]    . CYSTOSCOPY W/ RETROGRADES Bilateral 07/10/2012   Procedure: CYSTOSCOPY WITH RETROGRADE PYELOGRAM;  Surgeon: Molli Hazard, MD;  Location: [facility];  Service: Urology;  Laterality: Bilateral;  . CYSTOSCOPY WITH URETHRAL DILATATION  01/24/2012   Procedure: CYSTOSCOPY WITH URETHRAL DILATATION;  Surgeon: Molli Hazard, MD;  Location: [facility];  Service: Urology;  Laterality: N/A;  . IR NEPHROSTOMY PLACEMENT LEFT  07/16/2016  . LUMBAR LAMINECTOMY  06-02-2005   W/ RESECTION NERVE ROOT  L4  -  L5  . NEPHROSTOMY TUBE PLACEMENT ([place] HX)  2015   right   .  TRANSURETHRAL RESECTION OF BLADDER TUMOR  12/15/2011   Procedure: TRANSURETHRAL RESECTION OF BLADDER TUMOR (TURBT);  Surgeon: Molli Hazard, MD;  Location: [facility];  Service: Urology;  Laterality: N/A;  2 HRS   . TRANSURETHRAL RESECTION OF BLADDER TUMOR  01/24/2012   Procedure: TRANSURETHRAL RESECTION OF BLADDER TUMOR (TURBT);  Surgeon: Molli Hazard, MD;  Location: [facility];  Service: Urology;  Laterality: N/A;  90 MIN   . TRANSURETHRAL RESECTION OF BLADDER TUMOR N/A  07/10/2012   Procedure: TRANSURETHRAL RESECTION OF BLADDER TUMOR (TURBT);  Surgeon: Molli Hazard, MD;  Location[facility];  Service: Urology;  Laterality: N/A;  . ulner artery repair Right 2009   rt -trauma /thrombectomy,repair       Home Medications    Prior to Admission medications   Medication Sig Start Date End Date Taking? Authorizing Provider  citalopram (CELEXA) 40 MG tablet Take 40 mg by mouth every morning.    Yes [provider]  pantoprazole (PROTONIX) 40 MG tablet Take 40 mg by mouth 2 (two) times daily.  05/11/14  Yes [provider]  sevelamer carbonate (RENVELA) 800 MG tablet Take 1 tablet by mouth 3 (three) times daily. 08/11/16  Yes [provider]  sodium bicarbonate 650 MG tablet Take 2 tablets (1,300 mg total) by mouth 3 (three) times daily. Patient taking differently: Take 1,950 mg by mouth 2 (two) times daily.  10/23/14  Yes Robbie Lis, MD    Family History Family History  Problem Relation Age of Onset  . Diabetes Mellitus II Other   . Hypertension Other   . Bladder Cancer Neg Hx     Social History Social History  Substance Use Topics  . Smoking status: Former Smoker    Packs/day: 1.00    Years: 16.00    Types: Cigarettes    Quit date: 01/20/1992  . Smokeless tobacco: Former Systems developer    Types: [place] date: 01/20/1992  . Alcohol use No     Allergies   Lisinopril   Review of Systems Review of Systems  Constitutional: Negative for chills and fever.  Respiratory: Negative for shortness of breath.   Cardiovascular: Negative for chest pain.  Gastrointestinal: Negative for nausea and vomiting.  Musculoskeletal: Positive for myalgias.  Skin: Negative for wound.  Neurological: Negative for weakness and numbness.  All other systems reviewed and are negative.    Physical Exam Updated Vital Signs BP (!) 154/79 (BP Location: Right Arm)   Pulse 99   Temp 98.2 F (36.8 C) (Oral)   Resp 18    SpO2 100%   Physical Exam  Constitutional: He appears well-developed and well-nourished. No distress.  HENT:  Head: Normocephalic and atraumatic.  Eyes: Conjunctivae are normal.  Neck: Neck supple.  Cardiovascular: Normal rate, regular rhythm, normal heart sounds and intact distal pulses.   Pulmonary/Chest: Effort normal and breath sounds normal. No respiratory distress.  Abdominal: Soft. There is no tenderness. There is no guarding.  Musculoskeletal: He exhibits tenderness. He exhibits no edema.  Tenderness to the posterior bilateral thighs. No notable tenderness to the patient's calves, ankles, or feet.  Lymphadenopathy:    He has no cervical adenopathy.  Neurological: He is alert.  No acute sensory deficits. Strength in bilateral lower extremities 5 out of 5.  Skin: Skin is warm and dry. He is not diaphoretic.  Psychiatric: He has a normal mood and affect. His behavior is normal.  Nursing note and  vitals reviewed.    ED Treatments / Results  Labs (all labs ordered are listed, but only abnormal results are displayed) Labs Reviewed  COMPREHENSIVE METABOLIC PANEL - Abnormal; Notable for the following:       Result Value   BUN 41 (*)    Creatinine, Ser 5.52 (*)    GFR calc non Af Amer 10 (*)    GFR calc Af Amer 12 (*)    All other components within normal limits  CBC WITH DIFFERENTIAL/PLATELET - Abnormal; Notable for the following:    RBC 2.99 (*)    Hemoglobin 8.9 (*)    HCT 26.6 (*)    All other components within normal limits  I-STAT CHEM 8, ED - Abnormal; Notable for the following:    Chloride 100 (*)    BUN 39 (*)    Creatinine, Ser 5.90 (*)    Hemoglobin 8.5 (*)    HCT 25.0 (*)    All other components within normal limits   Hemoglobin  Date Value Ref Range Status  08/26/2016 8.5 (L) 13.0 - 17.0 g/dL Final  08/26/2016 8.9 (L) 13.0 - 17.0 g/dL Final  07/16/2016 11.4 (L) 13.0 - 17.0 g/dL Final  10/21/2015 10.6 (L) 13.0 - 17.0 g/dL Final    EKG  EKG  Interpretation None       Radiology No results found.  Procedures Procedures (including critical care time)  Medications Ordered in ED Medications  morphine 2 MG/ML injection 4 mg (4 mg Intravenous Given 08/26/16 0417)  HYDROmorphone (DILAUDID) injection 1 mg (1 mg Intravenous Given 08/26/16 0536)     Initial Impression / Assessment and Plan / ED Course  I have reviewed the triage vital signs and the nursing notes.  Pertinent labs & imaging results that were available during my care of the patient were reviewed by me and considered in my medical decision making (see chart for details).       Patient presents with bilateral lower extremity pain. He is new to dialysis and this may be contributing to the patient's symptoms. Lab results encouraging.  End of shift patient care handoff report given to Joline Maxcy, PA-C. Plan: Address pain as needed. Awaiting lower extremity arterial and venous US. If normal, discharge patient with pain management and follow up with his nephrologist.   Findings and plan of care discussed with Jola Schmidt, MD. Dr. Venora Maples personally evaluated and examined this patient.  Vitals:   08/26/16 0519 08/26/16 0530 08/26/16 0600 08/26/16 0630  BP: 135/74 (!) 141/71 133/69 126/75  Pulse: 91 82  81  Resp: 18 18  20   Temp:      TempSrc:      SpO2: 99% 98%  96%     Final Clinical Impressions(s) / ED Diagnoses   Final diagnoses:  None    New Prescriptions New Prescriptions   No medications on file     Layla Maw 08/26/16 [id number]    Lorayne Bender, PA-C 08/26/16 0701    Jola Schmidt, MD 08/26/16 [phone]

## 2016-08-26 NOTE — Discharge Instructions (Signed)
Please keep your appointment with dialysis. Please call urology and schedule a follow up visit and let them know you were seen in the Emergency Department today. Please be cautious when taking Percocet if you are working or driving because it can make you sleepy. If you develop new or worsening symptoms, please return to the Emergency Department for re-evaluation.

## 2016-08-26 NOTE — Progress Notes (Signed)
VASCULAR LAB PRELIMINARY  ARTERIAL  ABI completed:    RIGHT    LEFT    PRESSURE WAVEFORM  PRESSURE WAVEFORM  BRACHIAL 134 Tri BRACHIAL AVF n/a  DP 171 Tri DP 168 Tri  PT 181 Tri PT 181 Tri    RIGHT LEFT  ABI 1.35 1.35   Bilateral lower extremity arterial evaluation demonstrates triphasic waves throughout and ABI's within the normal range (indicating no significant peripheral arterial disease).  Left brachial not evaluated due to AVF.  Everrett Coombe, RVT 08/26/2016, 10:45 AM

## 2016-08-26 NOTE — ED Triage Notes (Signed)
Pt complains of gout in both feet, he receives dialysis and last treatment was Saturday, he says he usually gets heparin and this time it wasn't ordered

## 2016-08-26 NOTE — Progress Notes (Signed)
*  PRELIMINARY RESULTS* Vascular Ultrasound Bilateral lower extremity venous duplex has been completed.  Preliminary findings: No evidence of deep vein thrombosis or baker's cysts bilaterally.  Nothing seen posterior thigh(s) area of pain.  Preliminary results given to Dr. Johnney Killian @ 9:00.   Everrett Coombe 08/26/2016, 9:04 AM

## 2016-11-10 ENCOUNTER — Emergency Department (HOSPITAL_COMMUNITY)
Admission: EM | Admit: 2016-11-10 | Discharge: 2016-11-10 | Disposition: A | Discharge status: Home / Self Care | Payer: BC Managed Care – PPO | Attending: Emergency Medicine | Admitting: Emergency Medicine

## 2016-11-10 ENCOUNTER — Encounter (HOSPITAL_COMMUNITY): Payer: Self-pay | Admitting: Emergency Medicine

## 2016-11-10 DIAGNOSIS — Z79899 Other long term (current) drug therapy: Secondary | ICD-10-CM | POA: Diagnosis not present

## 2016-11-10 DIAGNOSIS — D649 Anemia, unspecified: Secondary | ICD-10-CM | POA: Insufficient documentation

## 2016-11-10 DIAGNOSIS — I120 Hypertensive chronic kidney disease with stage 5 chronic kidney disease or end stage renal disease: Secondary | ICD-10-CM | POA: Diagnosis not present

## 2016-11-10 DIAGNOSIS — Z992 Dependence on renal dialysis: Secondary | ICD-10-CM | POA: Diagnosis not present

## 2016-11-10 DIAGNOSIS — Z87891 Personal history of nicotine dependence: Secondary | ICD-10-CM | POA: Insufficient documentation

## 2016-11-10 DIAGNOSIS — E119 Type 2 diabetes mellitus without complications: Secondary | ICD-10-CM | POA: Insufficient documentation

## 2016-11-10 DIAGNOSIS — N186 End stage renal disease: Secondary | ICD-10-CM | POA: Insufficient documentation

## 2016-11-10 DIAGNOSIS — R5383 Other fatigue: Secondary | ICD-10-CM | POA: Diagnosis present

## 2016-11-10 DIAGNOSIS — E860 Dehydration: Secondary | ICD-10-CM | POA: Insufficient documentation

## 2016-11-10 LAB — HEPATIC FUNCTION PANEL
ALT: 44 U/L (ref 17–63)
AST: 26 U/L (ref 15–41)
Albumin: 4.1 g/dL (ref 3.5–5.0)
Alkaline Phosphatase: 94 U/L (ref 38–126)
Bilirubin, Direct: <0.1 mg/dL — ABNORMAL LOW (ref 0.1–0.5)
TOTAL PROTEIN: 7.2 g/dL (ref 6.5–8.1)
Total Bilirubin: 0.8 mg/dL (ref 0.3–1.2)

## 2016-11-10 LAB — URINALYSIS, ROUTINE W REFLEX MICROSCOPIC
Glucose, UA: NEGATIVE mg/dL
Ketones, ur: NEGATIVE mg/dL
Nitrite: NEGATIVE
Protein, ur: 30 mg/dL — AB
SPECIFIC GRAVITY, URINE: 1.01 (ref 1.005–1.030)
pH: 7.5 (ref 5.0–8.0)

## 2016-11-10 LAB — BASIC METABOLIC PANEL
Anion gap: 12 (ref 5–15)
BUN: 34 mg/dL — ABNORMAL HIGH (ref 6–20)
CALCIUM: 9.6 mg/dL (ref 8.9–10.3)
CO2: 27 mmol/L (ref 22–32)
Chloride: 98 mmol/L — ABNORMAL LOW (ref 101–111)
Creatinine, Ser: 5.25 mg/dL — ABNORMAL HIGH (ref 0.61–1.24)
GFR, EST AFRICAN AMERICAN: 13 mL/min — AB (ref >60)
GFR, EST NON AFRICAN AMERICAN: 11 mL/min — AB (ref >60)
Glucose, Bld: 123 mg/dL — ABNORMAL HIGH (ref 65–99)
Potassium: 3.4 mmol/L — ABNORMAL LOW (ref 3.5–5.1)
Sodium: 137 mmol/L (ref 135–145)

## 2016-11-10 LAB — URINALYSIS, MICROSCOPIC (REFLEX)

## 2016-11-10 LAB — CBC
HCT: 31.1 % — ABNORMAL LOW (ref 39.0–52.0)
Hemoglobin: 10.7 g/dL — ABNORMAL LOW (ref 13.0–17.0)
MCH: 29.9 pg (ref 26.0–34.0)
MCHC: 34.4 g/dL (ref 30.0–36.0)
MCV: 86.9 fL (ref 78.0–100.0)
PLATELETS: 216 10*3/uL (ref 150–400)
RBC: 3.58 MIL/uL — AB (ref 4.22–5.81)
RDW: 14.5 % (ref 11.5–15.5)
WBC: 7.9 10*3/uL (ref 4.0–10.5)

## 2016-11-10 LAB — LIPASE, BLOOD: Lipase: 37 U/L (ref 11–51)

## 2016-11-10 LAB — CBG MONITORING, ED: GLUCOSE-CAPILLARY: 127 mg/dL — AB (ref 65–99)

## 2016-11-10 LAB — AMMONIA: AMMONIA: 29 umol/L (ref 9–35)

## 2016-11-10 MED ORDER — SODIUM CHLORIDE 0.9 % IV BOLUS (SEPSIS)
500.0000 mL | Freq: Once | INTRAVENOUS | Status: AC
  Administered 2016-11-10: 500 mL via INTRAVENOUS

## 2016-11-10 NOTE — ED Notes (Signed)
Pt CBG was 127, notified Greg(RN)

## 2016-11-10 NOTE — ED Provider Notes (Signed)
[facility] DEPT Provider Note   CSN: [id number] Arrival date & time: 11/10/16  6237     History   Chief Complaint Chief Complaint  Patient presents with  . Fatigue    HPI [name] [name] is a 56 y.o. male.  [name] [name] is a 56 y.o. Male with a history of end-stage renal disease on dialysis Monday, Wednesday and Friday, neurogenic bladder, and diabetes who presents to the emergency department complaining of feeling fatigued over the past week. Patient reports he has been feeling more fatigued and having low energy over the past week. He reports over the last 2-3 days he's had some nausea with dry heaves. He's had decreased appetite and ate an egg sandwich yesterday. He's had nothing to eat or drink today. He tells me he's been coming into dialysis at his dry weight. He also reports they have stopped him on his sodium bicarbonate pills about 2 weeks ago and wonders if this could be related. Patient also reports he had an unknown hepatitis shot last week. He denies any abdominal pain or vomiting today. No diarrhea. He makes some urine. He denies any changes to his urination. He last had dialysis Monday. He denies fevers, coughing, chest pain, shortness of breath, abdominal pain, vomiting, diarrhea, urinary symptoms, rashes, numbness, tingling, weakness, headache, changes to his vision or neck pain.   The history is provided by the patient, medical records and the spouse. No language interpreter was used.    Past Medical History:  Diagnosis Date  . Acute renal injury ([place]) 11/2013  . Anemia   . Anxiety   . Bladder cancer ([place]) 2013  . Complication of anesthesia    agitation w/awakening in 9/13,OK 01/24/12  . Depression   . Diabetes mellitus type 2, diet-controlled ([place]) PT STOPPED TAKING METFORMIN--  HAS BEEN WATCHING DIET, EXERCISING AND LOSING WT  . Frequency of urination   . GERD (gastroesophageal reflux disease)   . Gout    recent flair -bilateral feet--  STABLE  .  Hypertension   . Kidney failure   . Nocturia   . Urgency incontinence     Patient Active Problem List   Diagnosis Date Noted  . Pyelonephritis 10/20/2015  . Hydronephrosis 10/20/2015  . E-coli UTI   . Anemia of chronic disease   . Acute encephalopathy 10/19/2014  . Altered mental status 10/19/2014  . Faintness   . Syncope 07/06/2014  . Syncope and collapse 07/05/2014  . CKD (chronic kidney disease), stage IV ([place]) 07/05/2014  . Acute coccygeal pain 07/05/2014  . Occipital scalp laceration 07/05/2014  . Diabetes mellitus type 2, diet-controlled ([place]) 07/05/2014  . Protein calorie malnutrition ([place]) 11/29/2013  . AKI (acute kidney injury) ([place]) 10/30/2013  . Hypercalcemia 06/26/2013  . UTI (lower urinary tract infection) 06/25/2013  . Bladder cancer ([place]) 05/09/2013  . Major depressive disorder, single episode, severe ([place]) 09/03/2005    Past Surgical History:  Procedure Laterality Date  . AV FISTULA PLACEMENT Left 05/31/2014   Procedure: LEFT RADIOCEPHALIC ARTERIOVENOUS (AV) FISTULA CREATION ;  Surgeon: Mal Misty, MD;  Location: [facility];  Service: Vascular;  Laterality: Left;  . BACK SURGERY    . BLADDER SURGERY  04/26/2013   at the cancer treatment center of [place] in  [place]    . CYSTOSCOPY W/ RETROGRADES Bilateral 07/10/2012   Procedure: CYSTOSCOPY WITH RETROGRADE PYELOGRAM;  Surgeon: Molli Hazard, MD;  Location[facility];  Service: Urology;  Laterality: Bilateral;  . CYSTOSCOPY WITH URETHRAL  DILATATION  01/24/2012   Procedure: CYSTOSCOPY WITH URETHRAL DILATATION;  Surgeon: Molli Hazard, MD;  Location: [facility];  Service: Urology;  Laterality: N/A;  . IR NEPHROSTOMY PLACEMENT LEFT  07/16/2016  . LUMBAR LAMINECTOMY  06-02-2005   W/ RESECTION NERVE ROOT  L4  -  L5  . NEPHROSTOMY TUBE PLACEMENT ([place] HX)  2015   right   . TRANSURETHRAL RESECTION OF BLADDER TUMOR  12/15/2011   Procedure: TRANSURETHRAL RESECTION OF  BLADDER TUMOR (TURBT);  Surgeon: Molli Hazard, MD;  Location[facility];  Service: Urology;  Laterality: N/A;  2 HRS   . TRANSURETHRAL RESECTION OF BLADDER TUMOR  01/24/2012   Procedure: TRANSURETHRAL RESECTION OF BLADDER TUMOR (TURBT);  Surgeon: Molli Hazard, MD;  Location: [facility];  Service: Urology;  Laterality: N/A;  90 MIN   . TRANSURETHRAL RESECTION OF BLADDER TUMOR N/A 07/10/2012   Procedure: TRANSURETHRAL RESECTION OF BLADDER TUMOR (TURBT);  Surgeon: Molli Hazard, MD;  Location: [facility];  Service: Urology;  Laterality: N/A;  . ulner artery repair Right 2009   rt -trauma /thrombectomy,repair       Home Medications    Prior to Admission medications   Medication Sig Start Date End Date Taking? Authorizing Provider  citalopram (CELEXA) 40 MG tablet Take 40 mg by mouth every morning.     [provider]  oxyCODONE-acetaminophen (PERCOCET/ROXICET) 5-325 MG tablet Take 1 tablet by mouth every 4 (four) hours as needed for severe pain. 08/26/16   McDonald, Mia A, PA-C  pantoprazole (PROTONIX) 40 MG tablet Take 40 mg by mouth 2 (two) times daily.  05/11/14   [provider]  sevelamer carbonate (RENVELA) 800 MG tablet Take 1 tablet by mouth 3 (three) times daily. 08/11/16   [provider]  sodium bicarbonate 650 MG tablet Take 2 tablets (1,300 mg total) by mouth 3 (three) times daily. Patient taking differently: Take 1,950 mg by mouth 2 (two) times daily.  10/23/14   Robbie Lis, MD    Family History Family History  Problem Relation Age of Onset  . Diabetes Mellitus II Other   . Hypertension Other   . Bladder Cancer Neg Hx     Social History Social History  Substance Use Topics  . Smoking status: Former Smoker    Packs/day: 1.00    Years: 16.00    Types: Cigarettes    Quit date: 01/20/1992  . Smokeless tobacco: Former Systems developer    Types: [place] date: 01/20/1992  .  Alcohol use No     Allergies   Lisinopril   Review of Systems Review of Systems  Constitutional: Positive for appetite change and fatigue. Negative for chills and fever.  HENT: Negative for congestion and sore throat.   Eyes: Negative for visual disturbance.  Respiratory: Negative for cough, shortness of breath and wheezing.   Cardiovascular: Negative for chest pain and palpitations.  Gastrointestinal: Positive for nausea. Negative for abdominal pain, blood in stool, diarrhea and vomiting.  Genitourinary: Negative for dysuria, hematuria, penile pain and testicular pain.  Musculoskeletal: Negative for back pain and neck pain.  Skin: Negative for rash.  Neurological: Negative for syncope, weakness, light-headedness, numbness and headaches.     Physical Exam Updated Vital Signs BP 140/77 (BP Location: Right Arm)   Pulse 80   Temp 98.7 F (37.1 C) (Oral)   Resp 16   Ht 5\' 10"  (1.778 m)   Wt 90.7 kg (  200 lb)   SpO2 96%   BMI 28.70 kg/m   Physical Exam  Constitutional: He is oriented to person, place, and time. He appears well-developed and well-nourished. No distress.  Nontoxic appearing.  HENT:  Head: Normocephalic and atraumatic.  Right Ear: External ear normal.  Left Ear: External ear normal.  Mouth/Throat: Oropharynx is clear and moist.  Eyes: Pupils are equal, round, and reactive to light. Conjunctivae are normal. Right eye exhibits no discharge. Left eye exhibits no discharge.  Neck: Neck supple. No JVD present.  Cardiovascular: Normal rate, regular rhythm, normal heart sounds and intact distal pulses.  Exam reveals no gallop and no friction rub.   No murmur heard. Bilateral radial, posterior tibialis and dorsalis pedis pulses are intact.    Pulmonary/Chest: Effort normal and breath sounds normal. No stridor. No respiratory distress. He has no wheezes. He has no rales.  Lungs are clear to ascultation bilaterally. Symmetric chest expansion bilaterally. No increased  work of breathing. No rales or rhonchi.    Abdominal: Soft. Bowel sounds are normal. He exhibits no distension and no mass. There is no tenderness. There is no guarding.  Abdomen is soft and nontender to palpation.  Musculoskeletal: Normal range of motion. He exhibits no edema or tenderness.  No lower extremity edema or tenderness.  Lymphadenopathy:    He has no cervical adenopathy.  Neurological: He is alert and oriented to person, place, and time. No cranial nerve deficit or sensory deficit. He exhibits normal muscle tone. Coordination normal.  Patient is alert and oriented 3. Cranial nerves intact. Good and equal grip strengths bilaterally. Sensation and strength is intact his bilateral upper and lower extremities. Speech is clear and coherent.  Skin: Skin is warm and dry. Capillary refill takes less than 2 seconds. No rash noted. He is not diaphoretic. No erythema. No pallor.  Psychiatric: He has a normal mood and affect. His behavior is normal.  Nursing note and vitals reviewed.    ED Treatments / Results  Labs (all labs ordered are listed, but only abnormal results are displayed) Labs Reviewed  BASIC METABOLIC PANEL - Abnormal; Notable for the following:       Result Value   Potassium 3.4 (*)    Chloride 98 (*)    Glucose, Bld 123 (*)    BUN 34 (*)    Creatinine, Ser 5.25 (*)    GFR calc non Af Amer 11 (*)    GFR calc Af Amer 13 (*)    All other components within normal limits  CBC - Abnormal; Notable for the following:    RBC 3.58 (*)    Hemoglobin 10.7 (*)    HCT 31.1 (*)    All other components within normal limits  URINALYSIS, ROUTINE W REFLEX MICROSCOPIC - Abnormal; Notable for the following:    APPearance TURBID (*)    Hgb urine dipstick LARGE (*)    Bilirubin Urine LARGE (*)    Protein, ur 30 (*)    Leukocytes, UA LARGE (*)    All other components within normal limits  URINALYSIS, MICROSCOPIC (REFLEX) - Abnormal; Notable for the following:    Bacteria, UA MANY  (*)    Squamous Epithelial / LPF 0-5 (*)    All other components within normal limits  HEPATIC FUNCTION PANEL - Abnormal; Notable for the following:    Bilirubin, Direct <0.1 (*)    All other components within normal limits  CBG MONITORING, ED - Abnormal; Notable for the following:  Glucose-Capillary 127 (*)    All other components within normal limits  LIPASE, BLOOD  AMMONIA    EKG  EKG Interpretation  Date/Time:  Wednesday November 10 2016 09:36:38 EDT Ventricular Rate:  78 PR Interval:  164 QRS Duration: 100 QT Interval:  412 QTC Calculation: 469 R Axis:   85 Text Interpretation:  Normal sinus rhythm Normal ECG agree. no change from previous except resolution of rate related changes seen in 10/19/2014 tracing Confirmed by Charlesetta Shanks [phone]) on 11/11/2016 1:13:23 PM       Radiology No results found.  Procedures Procedures (including critical care time)  Medications Ordered in ED Medications  sodium chloride 0.9 % bolus 500 mL (0 mLs Intravenous Stopped 11/10/16 1721)     Initial Impression / Assessment and Plan / ED Course  I have reviewed the triage vital signs and the nursing notes.  Pertinent labs & imaging results that were available during my care of the patient were reviewed by me and considered in my medical decision making (see chart for details).    This is a 56 y.o. Male with a history of end-stage renal disease on dialysis Monday, Wednesday and Friday, neurogenic bladder, and diabetes who presents to the emergency department complaining of feeling fatigued over the past week. Patient reports he has been feeling more fatigued and having low energy over the past week. He reports over the last 2-3 days he's had some nausea with dry heaves. He's had decreased appetite and ate an egg sandwich yesterday. He's had nothing to eat or drink today. He tells me he's been coming into dialysis at his dry weight. He also reports they have stopped him on his sodium  bicarbonate pills about 2 weeks ago and wonders if this could be related. Patient also reports he had an unknown hepatitis shot last week. He denies any abdominal pain or vomiting today. No diarrhea. He makes some urine. He denies any changes to his urination. He last had dialysis Monday. He denies fevers, coughing, chest pain.  On exam the patient is afebrile and non-toxic appearing. His abdomen is soft and non-tender to palpation.  Blood work here is reassuring. CBC shows no leukocytosis. Hemoglobin is above his baseline. Suspect some hemoconcentration possibly due to some dehydration. BMP is consistent with the patient and end-stage renal disease on dialysis. Potassium is 3.4. Urinalysis is nitrite negative with a large leukocytes and too numerous to count white blood cells. This appears to be chronic and ongoing. No change in several recent UAs. He has no urinary symptoms. I doubt UTI. Urine sent for culture while on downtime. Hepatic function panel is unremarkable. Ammonia is 29. Lipase is within normal limits. Overall work up is reassuring. Suspect dehydration as the patient reports feeling better after 500 ml fluid bolus. He is tolerating applesauce and water by mouth prior to discharge. No vomiting or nausea. I encouraged him to go to dialysis tomorrow. I encouraged him to follow up with nephrology.  Strict and specific return precautions discussed. I advised the patient to follow-up with their primary care provider this week. I advised the patient to return to the emergency department with new or worsening symptoms or new concerns. The patient verbalized understanding and agreement with plan.   This patient was discussed with Dr. Dayna Barker who agrees with assessment and plan.   Final Clinical Impressions(s) / ED Diagnoses   Final diagnoses:  Fatigue, unspecified type  Dehydration    New Prescriptions Discharge Medication List as of 11/10/2016  5:22 PM       Waynetta Pean, PA-C 11/12/16  1937    Merrily Pew, MD 11/15/16 [phone]

## 2016-11-10 NOTE — ED Notes (Addendum)
PA at bedside.

## 2016-11-10 NOTE — ED Triage Notes (Signed)
Onset one week ago developed general weakness and loss of appetite. Alert answering and following commands appropriate. Denies pain. Dialysis M/W/F last dialysis was Monday. Did not go today because of increased general weakness.

## 2016-11-15 ENCOUNTER — Emergency Department (HOSPITAL_COMMUNITY)
Admission: EM | Admit: 2016-11-15 | Discharge: 2016-11-16 | Disposition: A | Discharge status: Other Acute Inpatient Hospital | Payer: BC Managed Care – PPO | Attending: Emergency Medicine | Admitting: Emergency Medicine

## 2016-11-15 ENCOUNTER — Encounter (HOSPITAL_COMMUNITY): Payer: Self-pay | Admitting: Emergency Medicine

## 2016-11-15 DIAGNOSIS — N184 Chronic kidney disease, stage 4 (severe): Secondary | ICD-10-CM | POA: Diagnosis not present

## 2016-11-15 DIAGNOSIS — E1122 Type 2 diabetes mellitus with diabetic chronic kidney disease: Secondary | ICD-10-CM | POA: Diagnosis not present

## 2016-11-15 DIAGNOSIS — Z992 Dependence on renal dialysis: Secondary | ICD-10-CM | POA: Diagnosis not present

## 2016-11-15 DIAGNOSIS — R45851 Suicidal ideations: Secondary | ICD-10-CM | POA: Insufficient documentation

## 2016-11-15 DIAGNOSIS — Z79899 Other long term (current) drug therapy: Secondary | ICD-10-CM | POA: Diagnosis not present

## 2016-11-15 DIAGNOSIS — I129 Hypertensive chronic kidney disease with stage 1 through stage 4 chronic kidney disease, or unspecified chronic kidney disease: Secondary | ICD-10-CM | POA: Diagnosis not present

## 2016-11-15 DIAGNOSIS — Z87891 Personal history of nicotine dependence: Secondary | ICD-10-CM | POA: Insufficient documentation

## 2016-11-15 DIAGNOSIS — F332 Major depressive disorder, recurrent severe without psychotic features: Secondary | ICD-10-CM | POA: Diagnosis present

## 2016-11-15 DIAGNOSIS — F430 Acute stress reaction: Secondary | ICD-10-CM | POA: Diagnosis present

## 2016-11-15 LAB — RENAL FUNCTION PANEL
Albumin: 4 g/dL (ref 3.5–5.0)
Albumin: 3.9 g/dL (ref 3.5–5.0)
Anion gap: 13 (ref 5–15)
Anion gap: 10 (ref 5–15)
BUN: 40 mg/dL — ABNORMAL HIGH (ref 6–20)
BUN: 42 mg/dL — ABNORMAL HIGH (ref 6–20)
CO2: 24 mmol/L (ref 22–32)
CO2: 24 mmol/L (ref 22–32)
Calcium: 9.4 mg/dL (ref 8.9–10.3)
Calcium: 9.2 mg/dL (ref 8.9–10.3)
Chloride: 103 mmol/L (ref 101–111)
Chloride: 105 mmol/L (ref 101–111)
Creatinine, Ser: 5.72 mg/dL — ABNORMAL HIGH (ref 0.61–1.24)
Creatinine, Ser: 5.74 mg/dL — ABNORMAL HIGH (ref 0.61–1.24)
GFR calc Af Amer: 11 mL/min — ABNORMAL LOW (ref >60)
GFR calc Af Amer: 11 mL/min — ABNORMAL LOW (ref >60)
GFR calc non Af Amer: 10 mL/min — ABNORMAL LOW (ref >60)
GFR calc non Af Amer: 10 mL/min — ABNORMAL LOW (ref >60)
Glucose, Bld: 109 mg/dL — ABNORMAL HIGH (ref 65–99)
Glucose, Bld: 93 mg/dL (ref 65–99)
Phosphorus: 3.4 mg/dL (ref 2.5–4.6)
Phosphorus: 4 mg/dL (ref 2.5–4.6)
Potassium: 3.2 mmol/L — ABNORMAL LOW (ref 3.5–5.1)
Potassium: 3.2 mmol/L — ABNORMAL LOW (ref 3.5–5.1)
Sodium: 140 mmol/L (ref 135–145)
Sodium: 139 mmol/L (ref 135–145)

## 2016-11-15 LAB — COMPREHENSIVE METABOLIC PANEL
ALK PHOS: 85 U/L (ref 38–126)
ALT: 36 U/L (ref 17–63)
AST: 19 U/L (ref 15–41)
Albumin: 4 g/dL (ref 3.5–5.0)
Anion gap: 10 (ref 5–15)
BUN: 40 mg/dL — ABNORMAL HIGH (ref 6–20)
CO2: 26 mmol/L (ref 22–32)
CREATININE: 5.88 mg/dL — AB (ref 0.61–1.24)
Calcium: 9.4 mg/dL (ref 8.9–10.3)
Chloride: 103 mmol/L (ref 101–111)
GFR, EST AFRICAN AMERICAN: 11 mL/min — AB (ref >60)
GFR, EST NON AFRICAN AMERICAN: 10 mL/min — AB (ref >60)
Glucose, Bld: 115 mg/dL — ABNORMAL HIGH (ref 65–99)
Potassium: 3.2 mmol/L — ABNORMAL LOW (ref 3.5–5.1)
SODIUM: 139 mmol/L (ref 135–145)
Total Bilirubin: 0.6 mg/dL (ref 0.3–1.2)
Total Protein: 6.8 g/dL (ref 6.5–8.1)

## 2016-11-15 LAB — RAPID URINE DRUG SCREEN, HOSP PERFORMED
AMPHETAMINES: NOT DETECTED
BENZODIAZEPINES: NOT DETECTED
Barbiturates: NOT DETECTED
COCAINE: NOT DETECTED
OPIATES: NOT DETECTED
TETRAHYDROCANNABINOL: NOT DETECTED

## 2016-11-15 LAB — CBC
HCT: 30.5 % — ABNORMAL LOW (ref 39.0–52.0)
HCT: 29.6 % — ABNORMAL LOW (ref 39.0–52.0)
Hemoglobin: 10.2 g/dL — ABNORMAL LOW (ref 13.0–17.0)
Hemoglobin: 10.1 g/dL — ABNORMAL LOW (ref 13.0–17.0)
MCH: 29.5 pg (ref 26.0–34.0)
MCH: 29.9 pg (ref 26.0–34.0)
MCHC: 33.4 g/dL (ref 30.0–36.0)
MCHC: 34.1 g/dL (ref 30.0–36.0)
MCV: 88.2 fL (ref 78.0–100.0)
MCV: 87.6 fL (ref 78.0–100.0)
PLATELETS: 256 10*3/uL (ref 150–400)
Platelets: 246 10*3/uL (ref 150–400)
RBC: 3.46 MIL/uL — AB (ref 4.22–5.81)
RBC: 3.38 MIL/uL — ABNORMAL LOW (ref 4.22–5.81)
RDW: 14.7 % (ref 11.5–15.5)
RDW: 14.7 % (ref 11.5–15.5)
WBC: 7.6 10*3/uL (ref 4.0–10.5)
WBC: 7.6 K/uL (ref 4.0–10.5)

## 2016-11-15 LAB — ACETAMINOPHEN LEVEL: Acetaminophen (Tylenol), Serum: <10 ug/mL — ABNORMAL LOW (ref 10–30)

## 2016-11-15 LAB — ETHANOL

## 2016-11-15 LAB — SALICYLATE LEVEL

## 2016-11-15 MED ORDER — DOXERCALCIFEROL 4 MCG/2ML IV SOLN: 1.0000 ug | INTRAVENOUS | Status: DC

## 2016-11-15 MED ORDER — HEPARIN SODIUM (PORCINE) 1000 UNIT/ML DIALYSIS
2800.0000 [IU] | Freq: Once | INTRAMUSCULAR | Status: AC
  Administered 2016-11-15: 2800 [IU] via INTRAVENOUS_CENTRAL
  Filled 2016-11-15: qty 3

## 2016-11-15 MED ORDER — SEVELAMER CARBONATE 800 MG PO TABS
800.0000 mg | ORAL_TABLET | Freq: Three times a day (TID) | ORAL | Status: DC
  Filled 2016-11-15 (×2): qty 1

## 2016-11-15 MED ORDER — HEPARIN SODIUM (PORCINE) 1000 UNIT/ML DIALYSIS
1000.0000 [IU] | INTRAMUSCULAR | Status: DC | PRN
  Filled 2016-11-15: qty 1

## 2016-11-15 MED ORDER — SODIUM CHLORIDE 0.9 % IV SOLN: 100.0000 mL | INTRAVENOUS | Status: DC | PRN

## 2016-11-15 MED ORDER — PENTAFLUOROPROP-TETRAFLUOROETH EX AERO
1.0000 "application " | INHALATION_SPRAY | CUTANEOUS | Status: DC | PRN
  Filled 2016-11-15: qty 30

## 2016-11-15 MED ORDER — LIDOCAINE-PRILOCAINE 2.5-2.5 % EX CREA
1.0000 "application " | TOPICAL_CREAM | CUTANEOUS | Status: DC | PRN
  Filled 2016-11-15: qty 5

## 2016-11-15 MED ORDER — DOXERCALCIFEROL 4 MCG/2ML IV SOLN
INTRAVENOUS | Status: AC
  Filled 2016-11-15: qty 2

## 2016-11-15 MED ORDER — ALTEPLASE 2 MG IJ SOLR: 2.0000 mg | Freq: Once | INTRAMUSCULAR | Status: DC | PRN

## 2016-11-15 MED ORDER — LIDOCAINE HCL (PF) 1 % IJ SOLN: 5.0000 mL | INTRAMUSCULAR | Status: DC | PRN

## 2016-11-15 NOTE — ED Triage Notes (Signed)
Pt has been accepted to  [facility] is to go later tonight.  Will have Pt sign Voluntary Admission and Consent for treatment and fax to [facility]  @  # [phone]

## 2016-11-15 NOTE — ED Triage Notes (Signed)
TC to [facility]  , May send PT to Dialysis before transfer to [facility].

## 2016-11-15 NOTE — BH Assessment (Signed)
Patient has been accepted to [facility].  Accepting physician is Dr. Bary Leriche.  Attending Physician will be Dr. Bary Leriche.  Patient has been assigned to room 325, by [place] Charge Nurse Jake Church.  Call report to [phone].  Representative/Transfer Coordinator is Dispensing optician Patient pre-admitted by [facility] Patient Access Truddie Coco)   [facility] Staff Mingo Amber., Social Worker & Ria Comment, [facility]) made aware of acceptance.   Patient can arrive anytime after 8:30pm

## 2016-11-15 NOTE — ED Notes (Signed)
TTS being completed. 

## 2016-11-15 NOTE — ED Notes (Signed)
Pt is still in dialysis.

## 2016-11-15 NOTE — ED Provider Notes (Signed)
[facility] DEPT Provider Note   CSN: [id number] Arrival date & time: 11/15/16  4128     History   Chief Complaint Chief Complaint  Patient presents with  . Suicidal    HPI [name] [name] is a 56 y.o. male.  HPI  Patient, with past medical history of kidney failure, diabetes, bladder cancer, presents to the ED for evaluation of suicidal ideations. His wife states that he has increased stress in his life due to his own health, taking care of his 4-year-old mother as well as stress from work. She states that he mention a few times that he wanted to take his own life. She states that he is not taking any medications or overdose just. She states he has been on Celexa for several years. She states that it was working successfully at the beginning that since he has been on dialysis for the past few months it has not been having the same effect. She states that his PCP also started on Wellbutrin a few days ago. However she states that he continues to have increased depressive episodes. He denies any homicidal ideations, eyes were visual hallucinations, lightheadedness, chest pain, trouble breathing.  Past Medical History:  Diagnosis Date  . Acute renal injury ([place]) 11/2013  . Anemia   . Anxiety   . Bladder cancer ([place]) 2013  . Complication of anesthesia    agitation w/awakening in 9/13,OK 01/24/12  . Depression   . Diabetes mellitus type 2, diet-controlled ([place]) PT STOPPED TAKING METFORMIN--  HAS BEEN WATCHING DIET, EXERCISING AND LOSING WT  . Frequency of urination   . GERD (gastroesophageal reflux disease)   . Gout    recent flair -bilateral feet--  STABLE  . Hypertension   . Kidney failure   . Nocturia   . Urgency incontinence     Patient Active Problem List   Diagnosis Date Noted  . Pyelonephritis 10/20/2015  . Hydronephrosis 10/20/2015  . E-coli UTI   . Anemia of chronic disease   . Acute encephalopathy 10/19/2014  . Altered mental status 10/19/2014  . Faintness     . Syncope 07/06/2014  . Syncope and collapse 07/05/2014  . CKD (chronic kidney disease), stage IV ([place]) 07/05/2014  . Acute coccygeal pain 07/05/2014  . Occipital scalp laceration 07/05/2014  . Diabetes mellitus type 2, diet-controlled ([place] [address] [place]) 07/05/2014  . Protein calorie malnutrition ([place]) 11/29/2013  . AKI (acute kidney injury) ([place]) 10/30/2013  . Hypercalcemia 06/26/2013  . UTI (lower urinary tract infection) 06/25/2013  . Bladder cancer ([place]) 05/09/2013  . Major depressive disorder, single episode, severe ([place]) 09/03/2005    Past Surgical History:  Procedure Laterality Date  . AV FISTULA PLACEMENT Left 05/31/2014   Procedure: LEFT RADIOCEPHALIC ARTERIOVENOUS (AV) FISTULA CREATION ;  Surgeon: Mal Misty, MD;  Location: [facility];  Service: Vascular;  Laterality: Left;  . BACK SURGERY    . BLADDER SURGERY  04/26/2013   at the cancer treatment center of [place] in  [place]    . CYSTOSCOPY W/ RETROGRADES Bilateral 07/10/2012   Procedure: CYSTOSCOPY WITH RETROGRADE PYELOGRAM;  Surgeon: Molli Hazard, MD;  Location: [facility];  Service: Urology;  Laterality: Bilateral;  . CYSTOSCOPY WITH URETHRAL DILATATION  01/24/2012   Procedure: CYSTOSCOPY WITH URETHRAL DILATATION;  Surgeon: Molli Hazard, MD;  Location: [facility];  Service: Urology;  Laterality: N/A;  . IR NEPHROSTOMY PLACEMENT LEFT  07/16/2016  . LUMBAR LAMINECTOMY  06-02-2005   W/ RESECTION NERVE ROOT  L4  -  L5  . NEPHROSTOMY TUBE PLACEMENT ([place] HX)  2015   right   . TRANSURETHRAL RESECTION OF BLADDER TUMOR  12/15/2011   Procedure: TRANSURETHRAL RESECTION OF BLADDER TUMOR (TURBT);  Surgeon: Molli Hazard, MD;  Location: [facility];  Service: Urology;  Laterality: N/A;  2 HRS   . TRANSURETHRAL RESECTION OF BLADDER TUMOR  01/24/2012   Procedure: TRANSURETHRAL RESECTION OF BLADDER TUMOR (TURBT);  Surgeon: Molli Hazard, MD;  Location: [facility];  Service: Urology;  Laterality: N/A;  90 MIN   . TRANSURETHRAL RESECTION OF BLADDER TUMOR N/A 07/10/2012   Procedure: TRANSURETHRAL RESECTION OF BLADDER TUMOR (TURBT);  Surgeon: Molli Hazard, MD;  Location: [facility];  Service: Urology;  Laterality: N/A;  . ulner artery repair Right 2009   rt -trauma /thrombectomy,repair       Home Medications    Prior to Admission medications   Medication Sig Start Date End Date Taking? Authorizing Provider  citalopram (CELEXA) 40 MG tablet Take 40 mg by mouth every morning.     [provider]  oxyCODONE-acetaminophen (PERCOCET/ROXICET) 5-325 MG tablet Take 1 tablet by mouth every 4 (four) hours as needed for severe pain. 08/26/16   McDonald, Mia A, PA-C  pantoprazole (PROTONIX) 40 MG tablet Take 40 mg by mouth 2 (two) times daily.  05/11/14   [provider]  sevelamer carbonate (RENVELA) 800 MG tablet Take 1 tablet by mouth 3 (three) times daily. 08/11/16   [provider]  sodium bicarbonate 650 MG tablet Take 2 tablets (1,300 mg total) by mouth 3 (three) times daily. Patient taking differently: Take 1,950 mg by mouth 2 (two) times daily.  10/23/14   Robbie Lis, MD    Family History Family History  Problem Relation Age of Onset  . Diabetes Mellitus II Other   . Hypertension Other   . Bladder Cancer Neg Hx     Social History Social History  Substance Use Topics  . Smoking status: Former Smoker    Packs/day: 1.00    Years: 16.00    Types: Cigarettes    Quit date: 01/20/1992  . Smokeless tobacco: Former Systems developer    Types: [place] date: 01/20/1992  . Alcohol use No     Allergies   Lisinopril   Review of Systems Review of Systems  Constitutional: Negative for appetite change, chills and fever.  HENT: Negative for ear pain, rhinorrhea, sneezing and sore throat.   Eyes: Negative for photophobia and visual disturbance.  Respiratory: Negative for cough, chest tightness,  shortness of breath and wheezing.   Cardiovascular: Negative for chest pain and palpitations.  Gastrointestinal: Negative for abdominal pain, blood in stool, constipation, diarrhea, nausea and vomiting.  Genitourinary: Negative for dysuria, hematuria and urgency.  Musculoskeletal: Negative for myalgias.  Skin: Negative for rash.  Neurological: Negative for dizziness, weakness and light-headedness.  Psychiatric/Behavioral: Positive for suicidal ideas. Negative for dysphoric mood and hallucinations.     Physical Exam Updated Vital Signs BP 121/76 (BP Location: Right Arm)   Pulse 74   Temp 98.6 F (37 C) (Oral)   Resp 15   Ht 5\' 10"  (1.778 m)   Wt 90.7 kg (200 lb)   SpO2 100%   BMI 28.70 kg/m   Physical Exam  Constitutional: He appears well-developed and well-nourished. No distress.  HENT:  Head: Normocephalic and atraumatic.  Nose: Nose normal.  Eyes: Conjunctivae and EOM are normal. Right eye exhibits no discharge. Left  eye exhibits no discharge. No scleral icterus.  Neck: Normal range of motion. Neck supple.  Cardiovascular: Normal rate, regular rhythm, normal heart sounds and intact distal pulses.  Exam reveals no gallop and no friction rub.   No murmur heard. Pulmonary/Chest: Effort normal and breath sounds normal. No respiratory distress.  Abdominal: Soft. Bowel sounds are normal. He exhibits no distension. There is no tenderness. There is no guarding.  Musculoskeletal: Normal range of motion. He exhibits no edema.  Neurological: He is alert. He exhibits normal muscle tone. Coordination normal.  Skin: Skin is warm and dry. No rash noted.  Psychiatric: He has a normal mood and affect.  Depressed mood.  Nursing note and vitals reviewed.    ED Treatments / Results  Labs (all labs ordered are listed, but only abnormal results are displayed) Labs Reviewed  COMPREHENSIVE METABOLIC PANEL  ETHANOL  SALICYLATE LEVEL  ACETAMINOPHEN LEVEL  CBC  RAPID URINE DRUG SCREEN,  HOSP PERFORMED    EKG  EKG Interpretation None       Radiology No results found.  Procedures Procedures (including critical care time)  Medications Ordered in ED Medications - No data to display   Initial Impression / Assessment and Plan / ED Course  I have reviewed the triage vital signs and the nursing notes.  Pertinent labs & imaging results that were available during my care of the patient were reviewed by me and considered in my medical decision making (see chart for details).     Patient presents to ED for suicidal ideations. Reports many life stressors including managing his own health (reports chronic kidney disease on dialysis MWF), lack of sleep, caring for elderly mother, work stress. Denies any HI, AH, VH. Denies any medication overdoses. On physical exam he does have a depressed mood present.  Medical screening labs unremarkable. Potassium 3.2. Patient is inquiring about dialysis that he was supposed to receive today. I spoke to nephrologist who states that since patient has normal lab values at this time, no need for emergent dialysis. Will consult TTS for further evaluation.  TTS recommends inpatient treatment. Per note, patient will be in facility that has dialysis unit. Nephrologist states that since patient has normal lab values, potassium at this time, no need for emergent dialysis at this time. Patient and wife updated on plan.   Final Clinical Impressions(s) / ED Diagnoses   Final diagnoses:  None    New Prescriptions New Prescriptions   No medications on file     Delia Heady, PA-C 11/15/16 1528    Gareth Morgan, MD 11/17/16 1406

## 2016-11-15 NOTE — ED Notes (Signed)
Regular Diet was Ordered before patient moved over.

## 2016-11-15 NOTE — Progress Notes (Signed)
HD tx initiated via 15Gx2 w/o problem, pull/push/flush equally w/o problem, VSS, will cont to monitor while on HD tx 

## 2016-11-15 NOTE — ED Notes (Addendum)
Belongings inventoried. In locker #3

## 2016-11-15 NOTE — Progress Notes (Signed)
Patient has been accepted by [facility], per Dr. Bary Leriche.  CSW contacted [place], [facility] Intake, to determine transfer time.  Pt will probaly transfer after 7 PM.    CSW called [facility] ED and asked Unit Secretary to relay request to pt's nurse to have consents signed and faxed to [facility] [phone]).  Areatha Keas. Judi Cong, MSW, [place] Disposition Clinical Social Work [phone] (cell) [phone] (office)

## 2016-11-15 NOTE — BH Assessment (Signed)
Tele Assessment Note   [name] [name] is an 56 y.o. male presenting to [facility] with physical complaints, but expressed suicidal thoughts over the weekend. Report SI thoughts with plan, similar to previous attempt of OD. Still has thoughts of SI today. The patient has history of depression and previous OD in 2007. The patient was admitted to [facility] at that time. The patient is in acute renal failure, a recent diagnosis since May of 2018. Receive dialysis 3 times a week. Reports a history cancer and other medical issues but continued to work as a Administrator. The patient is now on disability. Along with the stress of his current diagnosis he also expressed financial stress. The patient was despondent, gave little eye contact,  Had soft speech, was alert, had depressed mood and affect, lacked good judgement and insight.   The patient states supportive family, lives with his wife and expressed having children. The patient is not seeing a therapist or psychiatrist but does take anti-depressants. Indicated recent decrease in appetite and increase in sleep. Denied HI or A/V. Denied past drug use.   Zerita Boers NP recommends inpatient psychiatric treatment at a facility with a dialysis unit.   Diagnosis: MDD, recurrent severe, without psychosis  Past Medical History:  Past Medical History:  Diagnosis Date  . Acute renal injury ([place]) 11/2013  . Anemia   . Anxiety   . Bladder cancer ([place]) 2013  . Complication of anesthesia    agitation w/awakening in 9/13,OK 01/24/12  . Depression   . Diabetes mellitus type 2, diet-controlled ([place]) PT STOPPED TAKING METFORMIN--  HAS BEEN WATCHING DIET, EXERCISING AND LOSING WT  . Frequency of urination   . GERD (gastroesophageal reflux disease)   . Gout    recent flair -bilateral feet--  STABLE  . Hypertension   . Kidney failure   . Nocturia   . Urgency incontinence     Past Surgical History:  Procedure Laterality Date  . AV FISTULA PLACEMENT Left 05/31/2014    Procedure: LEFT RADIOCEPHALIC ARTERIOVENOUS (AV) FISTULA CREATION ;  Surgeon: Mal Misty, MD;  Location: [facility];  Service: Vascular;  Laterality: Left;  . BACK SURGERY    . BLADDER SURGERY  04/26/2013   at the cancer treatment center of [place] in  [place]    . CYSTOSCOPY W/ RETROGRADES Bilateral 07/10/2012   Procedure: CYSTOSCOPY WITH RETROGRADE PYELOGRAM;  Surgeon: Molli Hazard, MD;  Location: [facility];  Service: Urology;  Laterality: Bilateral;  . CYSTOSCOPY WITH URETHRAL DILATATION  01/24/2012   Procedure: CYSTOSCOPY WITH URETHRAL DILATATION;  Surgeon: Molli Hazard, MD;  Location: [facility];  Service: Urology;  Laterality: N/A;  . IR NEPHROSTOMY PLACEMENT LEFT  07/16/2016  . LUMBAR LAMINECTOMY  06-02-2005   W/ RESECTION NERVE ROOT  L4  -  L5  . NEPHROSTOMY TUBE PLACEMENT ([place] HX)  2015   right   . TRANSURETHRAL RESECTION OF BLADDER TUMOR  12/15/2011   Procedure: TRANSURETHRAL RESECTION OF BLADDER TUMOR (TURBT);  Surgeon: Molli Hazard, MD;  Location: [facility];  Service: Urology;  Laterality: N/A;  2 HRS   . TRANSURETHRAL RESECTION OF BLADDER TUMOR  01/24/2012   Procedure: TRANSURETHRAL RESECTION OF BLADDER TUMOR (TURBT);  Surgeon: Molli Hazard, MD;  Location: [facility];  Service: Urology;  Laterality: N/A;  90 MIN   . TRANSURETHRAL RESECTION OF BLADDER TUMOR N/A 07/10/2012   Procedure: TRANSURETHRAL RESECTION OF BLADDER TUMOR (TURBT);  Surgeon: Dennard Schaumann  Jasmine December, MD;  Location: [facility];  Service: Urology;  Laterality: N/A;  . ulner artery repair Right 2009   rt -trauma /thrombectomy,repair    Family History:  Family History  Problem Relation Age of Onset  . Diabetes Mellitus II Other   . Hypertension Other   . Bladder Cancer Neg Hx     Social History:  reports that he quit smoking about 24 years ago. His smoking use included Cigarettes. He has a 16.00  pack-year smoking history. He quit smokeless tobacco use about 24 years ago. His smokeless tobacco use included Chew. He reports that he does not drink alcohol or use drugs.  Additional Social History:  Alcohol / Drug Use Pain Medications: see MAR Prescriptions: see MAR Over the Counter: see MAR History of alcohol / drug use?: No history of alcohol / drug abuse  CIWA: CIWA-Ar BP: 127/68 Pulse Rate: 66 COWS:    PATIENT STRENGTHS: (choose at least two) Average or above average intelligence General fund of knowledge  Allergies:  Allergies  Allergen Reactions  . Lisinopril Cough    Home Medications:  (Not in a hospital admission)  OB/GYN Status:  No LMP for male patient.  General Assessment Data Location of Assessment: [facility] ED TTS Assessment: In system Is this a Tele or Face-to-Face Assessment?: Tele Assessment Is this an Initial Assessment or a Re-assessment for this encounter?: Initial Assessment Marital status: Married Is patient pregnant?: No Pregnancy Status: No Living Arrangements: Spouse/significant other Can pt return to current living arrangement?: Yes Admission Status: Voluntary Is patient capable of signing voluntary admission?: Yes Referral Source: Self/Family/Friend Insurance type: Insurance risk surveyor Exam ([place]) Medical Exam completed: Yes  Crisis Care Plan Living Arrangements: Spouse/significant other Name of Psychiatrist: n/a Name of Therapist: n/a  Education Status Is patient currently in school?: No  Risk to self with the past 6 months Suicidal Ideation: Yes-Currently Present Has patient been a risk to self within the past 6 months prior to admission? : Yes Suicidal Intent: Yes-Currently Present Has patient had any suicidal intent within the past 6 months prior to admission? : No Is patient at risk for suicide?: Yes Suicidal Plan?: Yes-Currently Present Has patient had any suicidal plan within the past 6 months prior to  admission? : No Specify Current Suicidal Plan: plan over the weekend Access to Means: Yes Specify Access to Suicidal Means: pills What has been your use of drugs/alcohol within the last 12 months?: n/a Previous Attempts/Gestures: Yes How many times?:  (at least once, 2007) Triggers for Past Attempts: Unknown Intentional Self Injurious Behavior: None Family Suicide History: Unknown Recent stressful life event(s): Financial Problems, Recent negative physical changes Persecutory voices/beliefs?: No Depression: Yes Depression Symptoms: Despondent, Feeling worthless/self pity Substance abuse history and/or treatment for substance abuse?: No Suicide prevention information given to non-admitted patients: Not applicable  Risk to Others within the past 6 months Homicidal Ideation: No Does patient have any lifetime risk of violence toward others beyond the six months prior to admission? : No Thoughts of Harm to Others: No Current Homicidal Intent: No Current Homicidal Plan: No Access to Homicidal Means: No Identified Victim: n/a History of harm to others?: Yes (assault charge in the past) Assessment of Violence: None Noted Violent Behavior Description: n/a Does patient have access to weapons?: No Criminal Charges Pending?: No Does patient have a court date: No Is patient on probation?: No  Psychosis Hallucinations: None noted Delusions: None noted  Mental Status Report Appearance/Hygiene: Unremarkable Eye Contact:  Poor Motor Activity: Unremarkable Speech: Logical/coherent, Soft Level of Consciousness: Alert Mood: Depressed, Empty Affect: Depressed Anxiety Level: None Thought Processes: Coherent, Relevant Judgement: Impaired Orientation: Person, Place, Time, Situation Obsessive Compulsive Thoughts/Behaviors: None  Cognitive Functioning Concentration: Decreased Memory: Recent Intact, Remote Intact IQ: Average Insight: Poor Impulse Control: Fair Appetite: Poor Weight  Loss: 0 Weight Gain: 0 Sleep: Increased Vegetative Symptoms:  (UTA)  ADLScreening [facility] Assessment Services) Patient's cognitive ability adequate to safely complete daily activities?: Yes Patient able to express need for assistance with ADLs?: Yes Independently performs ADLs?: Yes (appropriate for developmental age)  Prior Inpatient Therapy Prior Inpatient Therapy: Yes Prior Therapy Dates: 2007 Prior Therapy Facilty/Provider(s): [facility] Reason for Treatment: Depression  Prior Outpatient Therapy Prior Outpatient Therapy: No Does patient have an ACCT team?: No Does patient have Intensive In-House Services?  : No Does patient have [facility] services? : No Does patient have P4CC services?: No  ADL Screening (condition at time of admission) Patient's cognitive ability adequate to safely complete daily activities?: Yes Is the patient deaf or have difficulty hearing?: No Does the patient have difficulty seeing, even when wearing glasses/contacts?: No Does the patient have difficulty concentrating, remembering, or making decisions?: No Patient able to express need for assistance with ADLs?: Yes Does the patient have difficulty dressing or bathing?: No Independently performs ADLs?: Yes (appropriate for developmental age)       Abuse/Neglect Assessment (Assessment to be complete while patient is alone) Physical Abuse:  (UTA) Verbal Abuse:  (UTA) Sexual Abuse:  (UTA)     Advance Directives (For Healthcare) Does Patient Have a Medical Advance Directive?: No    Additional Information 1:1 In Past 12 Months?: No CIRT Risk: No Elopement Risk: No Does patient have medical clearance?: Yes     Disposition:  Disposition Initial Assessment Completed for this Encounter: Yes Disposition of Patient: Inpatient treatment program Type of inpatient treatment program: Adult  Mollie Germany 11/15/2016 1:23 PM

## 2016-11-15 NOTE — ED Triage Notes (Signed)
Pt here with family c/o being withdrawn, not eating and having SI; pt on dialysis and increased life stress

## 2016-11-15 NOTE — ED Notes (Signed)
Pt still in dialysis 

## 2016-11-15 NOTE — ED Triage Notes (Addendum)
Papers faxed to [facility]  Because frist fax was not received.

## 2016-11-15 NOTE — ED Triage Notes (Signed)
PT transported to Dialysis on Hospital bed.

## 2016-11-15 NOTE — Consult Note (Signed)
  I reviewed the chart. The payient will be transferred to [facility] when bed available for treatment of depression.

## 2016-11-15 NOTE — ED Notes (Signed)
Pt still in Dialysis

## 2016-11-15 NOTE — Consult Note (Signed)
[facility] Renal Consultation Note    Indication for Consultation:  Management of ESRD/hemodialysis; anemia, hypertension/volume and secondary hyperparathyroidism PCP:  HPI: [name] [name] is a 56 y.o. male with ESRD T, Th, S at [facility]. 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 Scr started to rise post op and he was followed by Dr. Moshe Cipro. He had L Nephroureterectomy 08/05/16, Scr elevated to 6.1. He has first hemodialysis at [facility] [place], [place] in May 2018. Also has history of DMT2 which he denies, depression, gout, GERD.   Patient was brought to ED by wife who says that he has been minimally responsive over weekend, refuses to get out of bed or talk. Says that he has talked about "taking his own life" but has not verbalized a plan. 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 He was seen by PCP 11/12/16 for worsening depression and was started on Welbutrin in addition to the celexa he was already taking for depression. Wife says he has no other complaints. Patient will speak in monosyllables only, reluctant to make eye contact. Closes eyes and refuses to answer most questions. HPI largely obtained from wife. He does agree to have dialysis today.   Labs unremarkable except for hypokalemia K+ 3.2 Na 136 Co2 26 Scr 5.8 BUN 40 Ca 9.1 WBC 7.6 HGB 10.2 HCT 30.5 PLT 256.   Past Medical History:  Diagnosis Date  . Acute renal injury ([place]) 11/2013  . Anemia   . Anxiety   . Bladder cancer ([place]) 2013  . Complication of anesthesia    agitation w/awakening in 9/13,OK 01/24/12  . Depression   . Diabetes mellitus type 2, diet-controlled ([place]) PT STOPPED TAKING METFORMIN--  HAS BEEN WATCHING DIET, EXERCISING AND LOSING WT  . Frequency of urination   .  GERD (gastroesophageal reflux disease)   . Gout    recent flair -bilateral feet--  STABLE  . Hypertension   . Kidney failure   . Nocturia   . Urgency incontinence    Past Surgical History:  Procedure Laterality Date  . AV FISTULA PLACEMENT Left 05/31/2014   Procedure: LEFT RADIOCEPHALIC ARTERIOVENOUS (AV) FISTULA CREATION ;  Surgeon: Mal Misty, MD;  Location: [facility];  Service: Vascular;  Laterality: Left;  . BACK SURGERY    . BLADDER SURGERY  04/26/2013   at the cancer [facility] [place] in  [place]    . CYSTOSCOPY W/ RETROGRADES Bilateral 07/10/2012   Procedure: CYSTOSCOPY WITH RETROGRADE PYELOGRAM;  Surgeon: Molli Hazard, MD;  Location: [facility];  Service: Urology;  Laterality: Bilateral;  . CYSTOSCOPY WITH URETHRAL DILATATION  01/24/2012   Procedure: CYSTOSCOPY WITH URETHRAL DILATATION;  Surgeon: Molli Hazard, MD;  Location: [facility];  Service: Urology;  Laterality: N/A;  . IR NEPHROSTOMY PLACEMENT LEFT  07/16/2016  . LUMBAR LAMINECTOMY  06-02-2005   W/ RESECTION NERVE ROOT  L4  -  L5  . NEPHROSTOMY TUBE PLACEMENT ([place] [facility])  2015   right   . TRANSURETHRAL RESECTION OF BLADDER TUMOR  12/15/2011   Procedure: TRANSURETHRAL RESECTION OF BLADDER TUMOR (TURBT);  Surgeon: Molli Hazard, MD;  Location: [facility];  Service: Urology;  Laterality: N/A;  2 HRS   . TRANSURETHRAL RESECTION OF BLADDER TUMOR  01/24/2012  Procedure: TRANSURETHRAL RESECTION OF BLADDER TUMOR (TURBT);  Surgeon: Molli Hazard, MD;  Location: [facility];  Service: Urology;  Laterality: N/A;  90 MIN   . TRANSURETHRAL RESECTION OF BLADDER TUMOR N/A 07/10/2012   Procedure: TRANSURETHRAL RESECTION OF BLADDER TUMOR (TURBT);  Surgeon: Molli Hazard, MD;  Location: [facility];  Service: Urology;  Laterality: N/A;  . ulner artery repair Right 2009   rt -trauma /thrombectomy,repair   Family  History  Problem Relation Age of Onset  . Diabetes Mellitus II Other   . Hypertension Other   . Bladder Cancer Neg Hx    Social History:  reports that he quit smoking about 24 years ago. His smoking use included Cigarettes. He has a 16.00 pack-year smoking history. He quit smokeless tobacco use about 24 years ago. His smokeless tobacco use included Chew. He reports that he does not drink alcohol or use drugs. Allergies  Allergen Reactions  . Lisinopril Cough   Prior to Admission medications   Medication Sig Start Date End Date Taking? Authorizing Provider  citalopram (CELEXA) 40 MG tablet Take 40 mg by mouth every morning.     [provider]  oxyCODONE-acetaminophen (PERCOCET/ROXICET) 5-325 MG tablet Take 1 tablet by mouth every 4 (four) hours as needed for severe pain. 08/26/16   McDonald, Mia A, PA-C  pantoprazole (PROTONIX) 40 MG tablet Take 40 mg by mouth 2 (two) times daily.  05/11/14   [provider]  sevelamer carbonate (RENVELA) 800 MG tablet Take 1 tablet by mouth 3 (three) times daily. 08/11/16   [provider]  sodium bicarbonate 650 MG tablet Take 2 tablets (1,300 mg total) by mouth 3 (three) times daily. Patient taking differently: Take 1,950 mg by mouth 2 (two) times daily.  10/23/14   Robbie Lis, MD   Current Facility-Administered Medications  Medication Dose Route Frequency Provider Last Rate Last Dose  . [START ON 11/17/2016] doxercalciferol (HECTOROL) injection 1 mcg  1 mcg Intravenous Q M,W,F-HD Valentina Gu, NP       Current Outpatient Prescriptions  Medication Sig Dispense Refill  . citalopram (CELEXA) 40 MG tablet Take 40 mg by mouth every morning.     Marland Kitchen oxyCODONE-acetaminophen (PERCOCET/ROXICET) 5-325 MG tablet Take 1 tablet by mouth every 4 (four) hours as needed for severe pain. 8 tablet 0  . pantoprazole (PROTONIX) 40 MG tablet Take 40 mg by mouth 2 (two) times daily.   6  . sevelamer carbonate (RENVELA) 800 MG tablet Take 1  tablet by mouth 3 (three) times daily.  1  . sodium bicarbonate 650 MG tablet Take 2 tablets (1,300 mg total) by mouth 3 (three) times daily. (Patient taking differently: Take 1,950 mg by mouth 2 (two) times daily. ) 90 tablet 0   Labs: Basic Metabolic Panel:  Recent Labs Lab 11/10/16 0940 11/15/16 1254  NA 137 139  K 3.4* 3.2*  CL 98* 103  CO2 27 26  GLUCOSE 123* 115*  BUN 34* 40*  CREATININE 5.25* 5.88*  CALCIUM 9.6 9.4   Liver Function Tests:  Recent Labs Lab 11/10/16 1354 11/15/16 1254  AST 26 19  ALT 44 36  ALKPHOS 94 85  BILITOT 0.8 0.6  PROT 7.2 6.8  ALBUMIN 4.1 4.0    Recent Labs Lab 11/10/16 1354  LIPASE 37    Recent Labs Lab 11/10/16 1354  AMMONIA 29   CBC:  Recent Labs Lab 11/10/16 0940 11/15/16 1254  WBC 7.9 7.6  HGB 10.7*  10.2*  HCT 31.1* 30.5*  MCV 86.9 88.2  PLT 216 256   Cardiac Enzymes: No results for input(s): CKTOTAL, CKMB, CKMBINDEX, TROPONINI in the last 168 hours. CBG:  Recent Labs Lab 11/10/16 0951  GLUCAP 127*   Iron Studies: No results for input(s): IRON, TIBC, TRANSFERRIN, FERRITIN in the last 72 hours. Studies/Results: No results found.  ROS: As per HPI otherwise negative.   Physical Exam: Vitals:   11/15/16 1200 11/15/16 1215 11/15/16 1230 11/15/16 1245  BP: (!) 119/55 137/84 136/78 133/79  Pulse: 66 67 66 73  Resp:      Temp:      TempSrc:      SpO2: 97% 94% 99% 100%  Weight:      Height:         General: Well developed, well nourished, in no acute distress. Head: Normocephalic, atraumatic, sclera non-icteric, mucus membranes are moist Neck: Supple. JVD not elevated. Lungs: Clear bilaterally to auscultation without wheezes, rales, or rhonchi. Breathing is unlabored. Heart: RRR with S1 S2. No murmurs, rubs, or gallops appreciated. Abdomen: Soft, non-tender, non-distended with normoactive bowel sounds. No rebound/guarding. No obvious abdominal masses. M-S:  Strength and tone appear normal for  age. Lower extremities:without edema or ischemic changes, no open wounds  Neuro: Alert and oriented X 3. Moves all extremities spontaneously. Psych:  Refuses to make eye contact. Withdrawn, answers questions in monosyllables.  Dialysis Access: LUA AVF + bruit  Dialysis Orders: [facility] MWF 4 hrs 180NRe 87 kg  2.0 K/ 2.0 Ca  400/800 -Heparin 2800 units IV TIW -Hectorol 1 mcg IV TIW -Mircera 100 mcg IV q 2 weeks (last dose 11/11/16 Last HGB 11.1 11/11/16)  Assessment/Plan: 1.  Depression/Suicidal Ideations: Plan adm to behavioral health.  2. Hypokalemia: K+ 3.2. 4.0 K bath.  3.  ESRD -  MWF. Usual heparin dose.  4.  Hypertension/volume  - No evidence of volume overload. BP controlled. No antihypertensive meds. Attempt to get to OP EDW.  5.  Anemia  - HGB stable. No ESA needed.  6.  Metabolic bone disease -  Cont binders, VDRA.  7.  Nutrition - Renal diet w/fluid restrictions.   [facility] H. Owens Shark, NP-C 11/15/2016, 2:56 PM  [employer] [phone]

## 2016-11-15 NOTE — ED Notes (Signed)
Pt in dialysis with sitter

## 2016-11-16 ENCOUNTER — Inpatient Hospital Stay
Admission: AD | Admit: 2016-11-16 | Discharge: 2016-11-19 | DRG: 885 | Disposition: A | Discharge status: Home / Self Care | Payer: BC Managed Care – PPO | Source: Intra-hospital | Attending: Psychiatry | Admitting: Psychiatry

## 2016-11-16 DIAGNOSIS — Z87891 Personal history of nicotine dependence: Secondary | ICD-10-CM

## 2016-11-16 DIAGNOSIS — Z8551 Personal history of malignant neoplasm of bladder: Secondary | ICD-10-CM | POA: Diagnosis not present

## 2016-11-16 DIAGNOSIS — N2581 Secondary hyperparathyroidism of renal origin: Secondary | ICD-10-CM | POA: Diagnosis present

## 2016-11-16 DIAGNOSIS — R45851 Suicidal ideations: Secondary | ICD-10-CM | POA: Diagnosis present

## 2016-11-16 DIAGNOSIS — D631 Anemia in chronic kidney disease: Secondary | ICD-10-CM | POA: Diagnosis present

## 2016-11-16 DIAGNOSIS — Z888 Allergy status to other drugs, medicaments and biological substances status: Secondary | ICD-10-CM | POA: Diagnosis not present

## 2016-11-16 DIAGNOSIS — E781 Pure hyperglyceridemia: Secondary | ICD-10-CM | POA: Diagnosis present

## 2016-11-16 DIAGNOSIS — Z992 Dependence on renal dialysis: Secondary | ICD-10-CM

## 2016-11-16 DIAGNOSIS — F332 Major depressive disorder, recurrent severe without psychotic features: Principal | ICD-10-CM | POA: Diagnosis present

## 2016-11-16 DIAGNOSIS — C679 Malignant neoplasm of bladder, unspecified: Secondary | ICD-10-CM | POA: Diagnosis present

## 2016-11-16 DIAGNOSIS — E538 Deficiency of other specified B group vitamins: Secondary | ICD-10-CM | POA: Diagnosis present

## 2016-11-16 DIAGNOSIS — G4700 Insomnia, unspecified: Secondary | ICD-10-CM | POA: Diagnosis present

## 2016-11-16 DIAGNOSIS — N184 Chronic kidney disease, stage 4 (severe): Secondary | ICD-10-CM | POA: Diagnosis present

## 2016-11-16 DIAGNOSIS — E1122 Type 2 diabetes mellitus with diabetic chronic kidney disease: Secondary | ICD-10-CM | POA: Diagnosis present

## 2016-11-16 DIAGNOSIS — I120 Hypertensive chronic kidney disease with stage 5 chronic kidney disease or end stage renal disease: Secondary | ICD-10-CM | POA: Diagnosis present

## 2016-11-16 DIAGNOSIS — M109 Gout, unspecified: Secondary | ICD-10-CM | POA: Diagnosis present

## 2016-11-16 DIAGNOSIS — N186 End stage renal disease: Secondary | ICD-10-CM | POA: Diagnosis present

## 2016-11-16 DIAGNOSIS — K219 Gastro-esophageal reflux disease without esophagitis: Secondary | ICD-10-CM | POA: Diagnosis present

## 2016-11-16 LAB — VITAMIN B12: Vitamin B-12: 230 pg/mL (ref 180–914)

## 2016-11-16 LAB — LITHIUM LEVEL

## 2016-11-16 LAB — LIPID PANEL
CHOL/HDL RATIO: 4.4 ratio
Cholesterol: 146 mg/dL (ref 0–200)
HDL: 33 mg/dL — AB (ref >40)
LDL CALC: UNDETERMINED mg/dL (ref 0–99)
Triglycerides: 534 mg/dL — ABNORMAL HIGH (ref <150)
VLDL: UNDETERMINED mg/dL (ref 0–40)

## 2016-11-16 LAB — TSH: TSH: 2.794 u[IU]/mL (ref 0.350–4.500)

## 2016-11-16 MED ORDER — ACETAMINOPHEN 325 MG PO TABS: 650.0000 mg | ORAL_TABLET | Freq: Four times a day (QID) | ORAL | Status: DC | PRN

## 2016-11-16 MED ORDER — TRAZODONE HCL 100 MG PO TABS
100.0000 mg | ORAL_TABLET | Freq: Every day | ORAL | Status: DC
  Administered 2016-11-16: 100 mg via ORAL
  Filled 2016-11-16: qty 1

## 2016-11-16 MED ORDER — VITAMIN B-12 1000 MCG PO TABS
1000.0000 ug | ORAL_TABLET | Freq: Every day | ORAL | Status: DC
  Administered 2016-11-16 – 2016-11-18 (×3): 1000 ug via ORAL
  Filled 2016-11-16 (×4): qty 1

## 2016-11-16 MED ORDER — VENLAFAXINE HCL ER 75 MG PO CP24
150.0000 mg | ORAL_CAPSULE | Freq: Every day | ORAL | Status: DC
  Administered 2016-11-17 – 2016-11-19 (×3): 150 mg via ORAL
  Filled 2016-11-16 (×3): qty 2

## 2016-11-16 MED ORDER — SEVELAMER CARBONATE 800 MG PO TABS
800.0000 mg | ORAL_TABLET | Freq: Three times a day (TID) | ORAL | Status: DC
Start: 2016-11-16 — End: 2016-11-19
  Administered 2016-11-16 – 2016-11-19 (×9): 800 mg via ORAL
  Filled 2016-11-16 (×12): qty 1

## 2016-11-16 MED ORDER — ALUM & MAG HYDROXIDE-SIMETH 200-200-20 MG/5ML PO SUSP: 30.0000 mL | ORAL | Status: DC | PRN

## 2016-11-16 MED ORDER — CITALOPRAM HYDROBROMIDE 20 MG PO TABS
40.0000 mg | ORAL_TABLET | Freq: Every morning | ORAL | Status: DC
  Administered 2016-11-16: 40 mg via ORAL
  Filled 2016-11-16: qty 2

## 2016-11-16 MED ORDER — PANTOPRAZOLE SODIUM 40 MG PO TBEC
40.0000 mg | DELAYED_RELEASE_TABLET | Freq: Two times a day (BID) | ORAL | Status: DC
  Administered 2016-11-16 – 2016-11-19 (×6): 40 mg via ORAL
  Filled 2016-11-16 (×6): qty 1

## 2016-11-16 MED ORDER — MAGNESIUM HYDROXIDE 400 MG/5ML PO SUSP: 30.0000 mL | Freq: Every day | ORAL | Status: DC | PRN

## 2016-11-16 MED ORDER — DOXERCALCIFEROL 4 MCG/2ML IV SOLN: 1.0000 ug | INTRAVENOUS | Status: DC

## 2016-11-16 NOTE — B[facility] Suicide Risk Assessment (Signed)
[name] [name] INPATIENT:  Family/Significant Other Suicide Prevention Education  Suicide Prevention Education:  Education Completed; [name] [name], wife, [phone], has been identified by the patient as the family member/significant other with whom the patient will be residing, and identified as the person(s) who will aid the patient in the event of a mental health crisis (suicidal ideations/suicide attempt).  With written consent from the patient, the family member/significant other has been provided the following suicide prevention education, prior to the and/or following the discharge of the patient.  The suicide prevention education provided includes the following:  Suicide risk factors  Suicide prevention and interventions  National Suicide Hotline telephone number  [facility] assessment telephone number  [facility] Emergency Assistance [place] and/or Residential Mobile Crisis Unit telephone number  Request made of family/significant other to:  Remove weapons (e.g., guns, rifles, knives), all items previously/currently identified as safety concern. The guns in the home have been sent to daughter's home and are locked.  Nephew has another gun, also locked.   Remove drugs/medications (over-the-counter, prescriptions, illicit drugs), all items previously/currently identified as a safety concern. [name] [name] will check this.  The family member/significant other verbalizes understanding of the suicide prevention education information provided.  The family member/significant other agrees to remove the items of safety concern listed above.  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  She was not aware that he was having suicidal thoughts until after he came to ED.  She had brought him because she was worried about his dialysis and that something was wrong with that.  She will check in with him moving forward after discharge.  [name] Chars, LCSW 11/16/2016, 4:00 PM

## 2016-11-16 NOTE — Plan of Care (Signed)
Problem: Activity: Goal: Interest or engagement in leisure activities will improve Outcome: Not Progressing Patient does not show interest in leisure activities.  Problem: Coping: Goal: Ability to cope will improve Outcome: Not Met (add Reason) Unable to access, patient newly admitted Goal: Ability to verbalize feelings will improve Outcome: Progressing Patient able to verbalize needs to staff appropriately  Problem: Health Behavior/Discharge Planning: Goal: Ability to make decisions will improve Outcome: Progressing Patient has shown the ability to make decisions.  Problem: Self-Concept: Goal: Level of anxiety will decrease Outcome: Not Progressing Patient verbalized increased anxiety.

## 2016-11-16 NOTE — B[facility] Group Notes (Signed)
[facility] LCSW Group Therapy Note  Date/Time: 11/16/16, 0930  Type of Therapy/Topic:  Group Therapy:  Feelings about Diagnosis  Participation Level:  Active   Mood: pleasant   Description of Group:    This group will allow patients to explore their thoughts and feelings about diagnoses they have received. Patients will be guided to explore their level of understanding and acceptance of these diagnoses. Facilitator will encourage patients to process their thoughts and feelings about the reactions of others to their diagnosis, and will guide patients in identifying ways to discuss their diagnosis with significant others in their lives. This group will be process-oriented, with patients participating in exploration of their own experiences as well as giving and receiving support and challenge from other group members.   Therapeutic Goals: 1. Patient will demonstrate understanding of diagnosis as evidence by identifying two or more symptoms of the disorder:  2. Patient will be able to express two feelings regarding the diagnosis 3. Patient will demonstrate ability to communicate their needs through discussion and/or role plays  Summary of Patient Progress: Pt did participate in group discussion regarding diagnosis, symptoms, and stigma related to mental health.  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  Pt asked CSW after group to inform him of his current diagnosis and agreed to further discuss with his MD today.        Therapeutic Modalities:   Cognitive Behavioral Therapy Brief Therapy Feelings Identification   Lurline Idol, LCSW

## 2016-11-16 NOTE — Progress Notes (Signed)
HD tx completed @ 2317 w/o problem, UF goal met (kept even as pt came in well below the ordered wt to get him to), blood rinsed back, VSS, report called to [employer], RN

## 2016-11-16 NOTE — Tx Team (Signed)
Initial Treatment Plan 11/16/2016 4:08 AM [name] [name] RUE:[id number]    PATIENT STRESSORS: Financial difficulties Health problems   PATIENT STRENGTHS: Ability for insight Average or above average intelligence Capable of independent living Motivation for treatment/growth Supportive family/friends   PATIENT IDENTIFIED PROBLEMS: Mood Instability  Suicidal Thoughts  ESRD with HD on M/W/F                 DISCHARGE CRITERIA:  Motivation to continue treatment in a less acute level of care Verbal commitment to aftercare and medication compliance  PRELIMINARY DISCHARGE PLAN: Outpatient therapy Return to previous living arrangement  PATIENT/FAMILY INVOLVEMENT: This treatment plan has been presented to and reviewed with the patient, [name] [name].  The patient and family have been given the opportunity to ask questions and make suggestions.  Electa Sniff, RN 11/16/2016, 4:08 AM

## 2016-11-16 NOTE — ED Provider Notes (Signed)
The patient appears reasonably stabilized for transfer considering the current resources, flow, and capabilities available in the ED at this time, and I doubt any other [facility] requiring further screening and/or treatment in the ED prior to transfer. Pt awake/alert No distress Reports he just got dialyzed No new complaints    Ripley Fraise, MD 11/16/16 [phone]

## 2016-11-16 NOTE — Consult Note (Signed)
[facility]  CONSULT NOTE    Date: 11/16/2016                  Patient Name:  [name] [name]  MRN: [id number]  DOB: October 18, 1960  Age / Sex: 56 y.o., male         PCP: [facility]                 Service Requesting Consult: Dr. Bary Leriche                 Reason for Consult: End Stage Renal Disease            History of Present Illness: [name] is a 56 y.o. white male with end stage renal disease on hemodialysis MWF [place], hypertension, bladder cancer, left nephrectomy, depression, GERD, gout, anemia who was admitted to [facility] on 11/16/2016 for Depression   Patient's last dialysis was Monday.    Medications: Outpatient medications: Prescriptions Prior to Admission  Medication Sig Dispense Refill Last Dose  . buPROPion (WELLBUTRIN SR) 100 MG 12 hr tablet Take 100 mg by mouth 2 (two) times daily.   11/14/2016 at 1000  . sodium bicarbonate 650 MG tablet Take 2 tablets (1,300 mg total) by mouth 3 (three) times daily. (Patient taking differently: Take 1,950 mg by mouth 2 (two) times daily. ) 90 tablet 0 11/15/2016 at 2300  . citalopram (CELEXA) 40 MG tablet Take 40 mg by mouth every morning.    11/14/2016 at 1000  . pantoprazole (PROTONIX) 40 MG tablet Take 40 mg by mouth 2 (two) times daily.   6 11/14/2016 at 2100  . sevelamer carbonate (RENVELA) 800 MG tablet Take 1 tablet by mouth 3 (three) times daily.  1 Not Taking at Unknown time    Current medications: Current Facility-Administered Medications  Medication Dose Route Frequency Provider Last Rate Last Dose  . acetaminophen (TYLENOL) tablet 650 mg  650 mg Oral Q6H PRN Pucilowska, Jolanta B, MD      . alum & mag hydroxide-simeth (MAALOX/MYLANTA) 200-200-20 MG/5ML suspension 30 mL  30 mL Oral Q4H PRN Pucilowska, Jolanta B, MD      . citalopram (CELEXA) tablet 40 mg  40 mg Oral q morning - 10a Pucilowska, Jolanta B, MD   40 mg at 11/16/16 0926  .  magnesium hydroxide (MILK OF MAGNESIA) suspension 30 mL  30 mL Oral Daily PRN Pucilowska, Jolanta B, MD      . pantoprazole (PROTONIX) EC tablet 40 mg  40 mg Oral BID Pucilowska, Jolanta B, MD   40 mg at 11/16/16 0808  . sevelamer carbonate (RENVELA) tablet 800 mg  800 mg Oral TID WC Pucilowska, Jolanta B, MD   800 mg at 11/16/16 0808  . traZODone (DESYREL) tablet 100 mg  100 mg Oral QHS Pucilowska, Jolanta B, MD          Allergies: Allergies  Allergen Reactions  . Lisinopril Cough      Past Medical History: Past Medical History:  Diagnosis Date  . Acute renal injury ([place]) 11/2013  . Anemia   . Anxiety   . Bladder cancer ([place]) 2013  . Complication of anesthesia    agitation w/awakening in 9/13,OK 01/24/12  . Depression   . Diabetes mellitus type 2, diet-controlled ([place]) PT STOPPED TAKING METFORMIN--  HAS BEEN WATCHING DIET, EXERCISING AND LOSING WT  . Frequency of urination   . GERD (gastroesophageal reflux disease)   . Gout  recent flair -bilateral feet--  STABLE  . Hypertension   . Kidney failure   . Nocturia   . Urgency incontinence      Past Surgical History: Past Surgical History:  Procedure Laterality Date  . AV FISTULA PLACEMENT Left 05/31/2014   Procedure: LEFT RADIOCEPHALIC ARTERIOVENOUS (AV) FISTULA CREATION ;  Surgeon: Mal Misty, MD;  Location: [facility];  Service: Vascular;  Laterality: Left;  . BACK SURGERY    . BLADDER SURGERY  04/26/2013   at the [facility] [place] in  [place]    . CYSTOSCOPY W/ RETROGRADES Bilateral 07/10/2012   Procedure: CYSTOSCOPY WITH RETROGRADE PYELOGRAM;  Surgeon: Molli Hazard, MD;  Location: [facility];  Service: Urology;  Laterality: Bilateral;  . CYSTOSCOPY WITH URETHRAL DILATATION  01/24/2012   Procedure: CYSTOSCOPY WITH URETHRAL DILATATION;  Surgeon: Molli Hazard, MD;  [facility];  Service: Urology;  Laterality: N/A;  . IR NEPHROSTOMY PLACEMENT LEFT   07/16/2016  . LUMBAR LAMINECTOMY  06-02-2005   W/ RESECTION NERVE ROOT  L4  -  L5  . NEPHROSTOMY TUBE PLACEMENT ([place] HX)  2015   right   . TRANSURETHRAL RESECTION OF BLADDER TUMOR  12/15/2011   Procedure: TRANSURETHRAL RESECTION OF BLADDER TUMOR (TURBT);  Surgeon: Molli Hazard, MD;  Location: [facility];  Service: Urology;  Laterality: N/A;  2 HRS   . TRANSURETHRAL RESECTION OF BLADDER TUMOR  01/24/2012   Procedure: TRANSURETHRAL RESECTION OF BLADDER TUMOR (TURBT);  Surgeon: Molli Hazard, MD;  Location: [facility];  Service: Urology;  Laterality: N/A;  90 MIN   . TRANSURETHRAL RESECTION OF BLADDER TUMOR N/A 07/10/2012   Procedure: TRANSURETHRAL RESECTION OF BLADDER TUMOR (TURBT);  Surgeon: Molli Hazard, MD;  Location: [facility];  Service: Urology;  Laterality: N/A;  . ulner artery repair Right 2009   rt -trauma /thrombectomy,repair     Family History: Family History  Problem Relation Age of Onset  . Diabetes Mellitus II Other   . Hypertension Other   . Bladder Cancer Neg Hx      Social History: Social History   Social History  . Marital status: Married    Spouse name: N/A  . Number of children: N/A  . Years of education: N/A   Occupational History  . Not on file.   Social History Main Topics  . Smoking status: Former Smoker    Packs/day: 1.00    Years: 16.00    Types: Cigarettes    Quit date: 01/20/1992  . Smokeless tobacco: Former Systems developer    Types: [place] date: 01/20/1992  . Alcohol use No  . Drug use: No  . Sexual activity: Not Currently   Other Topics Concern  . Not on file   Social History Narrative  . No narrative on file     Review of Systems: Review of Systems  Constitutional: Negative.  Negative for chills, diaphoresis, fever, malaise/fatigue and weight loss.  HENT: Negative.  Negative for congestion, ear discharge, ear pain, hearing loss, nosebleeds, sinus pain, sore  throat and tinnitus.   Eyes: Negative.  Negative for blurred vision, double vision, photophobia, pain, discharge and redness.  Respiratory: Negative.  Negative for cough, hemoptysis, sputum production, shortness of breath, wheezing and stridor.   Cardiovascular: Negative.  Negative for chest pain, palpitations, orthopnea, claudication, leg swelling and PND.  Gastrointestinal: Negative for abdominal pain, blood in stool, constipation, diarrhea, heartburn, melena, nausea  and vomiting.  Genitourinary: Negative.  Negative for dysuria, flank pain, frequency, hematuria and urgency.  Musculoskeletal: Negative.  Negative for back pain, falls, joint pain, myalgias and neck pain.  Skin: Negative.  Negative for itching and rash.  Neurological: Negative.  Negative for dizziness, tingling, tremors, sensory change, speech change, focal weakness, seizures, loss of consciousness, weakness and headaches.  Endo/Heme/Allergies: Negative.  Negative for environmental allergies and polydipsia. Does not bruise/bleed easily.  Psychiatric/Behavioral: Positive for depression. Negative for hallucinations, memory loss, substance abuse and suicidal ideas. The patient is nervous/anxious. The patient does not have insomnia.     Vital Signs: Blood pressure 110/62, pulse 76, temperature 98.2 F (36.8 C), temperature source Oral, resp. rate 18, height 5\' 10"  (1.778 m), weight 83.9 kg (185 lb), SpO2 100 %.  Weight trends: Filed Weights   11/16/16 0224  Weight: 83.9 kg (185 lb)    Physical Exam: General: NAD, sitting in bed  Head: Normocephalic, atraumatic. Moist oral mucosal membranes  Eyes: Anicteric, PERRL  Neck: Supple, trachea midline  Lungs:  Clear to auscultation  Heart: Regular rate and rhythm  Abdomen:  Soft, nontender,   Extremities: no peripheral edema.  Neurologic: Nonfocal, moving all four extremities  Skin: No lesions  Access: Left forearm AVF     Lab results: Basic Metabolic Panel:  Recent  Labs Lab 11/10/16 0940 11/15/16 1254 11/15/16 1930  NA 137 140  139 139  K 3.4* 3.2*  3.2* 3.2*  CL 98* 103  103 105  CO2 27 24  26 24   GLUCOSE 123* 109*  115* 93  BUN 34* 40*  40* 42*  CREATININE 5.25* 5.72*  5.88* 5.74*  CALCIUM 9.6 9.4  9.4 9.2  PHOS  --  3.4 4.0    Liver Function Tests:  Recent Labs Lab 11/10/16 1354 11/15/16 1254 11/15/16 1930  AST 26 19  --   ALT 44 36  --   ALKPHOS 94 85  --   BILITOT 0.8 0.6  --   PROT 7.2 6.8  --   ALBUMIN 4.1 4.0  4.0 3.9    Recent Labs Lab 11/10/16 1354  LIPASE 37    Recent Labs Lab 11/10/16 1354  AMMONIA 29    CBC:  Recent Labs Lab 11/10/16 0940 11/15/16 1254 11/15/16 1930  WBC 7.9 7.6 7.6  HGB 10.7* 10.2* 10.1*  HCT 31.1* 30.5* 29.6*  MCV 86.9 88.2 87.6  PLT 216 256 246    Cardiac Enzymes: No results for input(s): CKTOTAL, CKMB, CKMBINDEX, TROPONINI in the last 168 hours.  BNP: Invalid input(s): POCBNP  CBG:  Recent Labs Lab 11/10/16 0951  GLUCAP 127*    Microbiology: Results for orders placed or performed during the hospital encounter of 10/20/15  Urine culture     Status: Abnormal   Collection Time: 10/20/15  4:49 AM  Result Value Ref Range Status   Specimen Description URINE, CLEAN CATCH  Final   Special Requests NONE  Final   Culture MULTIPLE SPECIES PRESENT, SUGGEST RECOLLECTION (A)  Final   Report Status 10/21/2015 FINAL  Final  Blood culture (routine x 2)     Status: None   Collection Time: 10/20/15  8:34 AM  Result Value Ref Range Status   Specimen Description BLOOD RIGHT HAND  Final   Special Requests BOTTLES DRAWN AEROBIC AND ANAEROBIC 5ML  Final   Culture   Final    NO GROWTH 5 DAYS Performed at [facility]    Report Status 10/25/2015 FINAL  Final  Blood culture (routine x 2)     Status: None   Collection Time: 10/20/15  8:34 AM  Result Value Ref Range Status   Specimen Description BLOOD RIGHT HAND  Final   Special Requests BOTTLES DRAWN AEROBIC AND  ANAEROBIC 5ML  Final   Culture   Final    NO GROWTH 5 DAYS Performed at [facility]    Report Status 10/25/2015 FINAL  Final    Coagulation Studies: No results for input(s): LABPROT, INR in the last 72 hours.  Urinalysis: No results for input(s): COLORURINE, LABSPEC, PHURINE, GLUCOSEU, HGBUR, BILIRUBINUR, KETONESUR, PROTEINUR, UROBILINOGEN, NITRITE, LEUKOCYTESUR in the last 72 hours.  Invalid input(s): APPERANCEUR    Imaging:  No results found.   Assessment & Plan: Mr. CHIRAG KRUEGER is a 56 y.o. white male with end stage renal disease on hemodialysis MWF [place], hypertension, bladder cancer, left nephrectomy, depression, GERD, gout, anemia who was admitted to [facility] on 11/16/2016 for Depression   MWF [facility]  1. End Stage Renal Disease: schedule dialysis for tomorrow, and resume MWF schedule.   2. Hypertension: blood pressure at goal. Not currently on any medications.   3. Anemia of chronic kidney disease: hemoglobin 10.1 - Mir[facility] as outpatient.   4. Secondary Hyperparathyroidism: phosphorus at goal.  - Continue sevelamer   LOS: 0 Iara Monds 8/14/201810:09 AM

## 2016-11-16 NOTE — Plan of Care (Signed)
Problem: Activity: Goal: Sleeping patterns will improve Outcome: Progressing Patient slept for Estimated Hours of 1, arrived late to unit from [facility]; Precautionary checks every 15 minutes for safety maintained, room free of safety hazards, patient sustains no injury or falls during this shift.

## 2016-11-16 NOTE — H&P (Signed)
Psychiatric Admission Assessment Adult  Patient Identification: KARON HECKENDORN MRN:  [id number] Date of Evaluation:  11/16/2016 Chief Complaint:  Depression Principal Diagnosis: Major depressive disorder, recurrent severe without psychotic features ([place]) Diagnosis:   Patient Active Problem List   Diagnosis Date Noted  . GERD (gastroesophageal reflux disease) [K21.9] 11/16/2016  . Suicidal ideation [R45.851] 11/16/2016  . Pyelonephritis [N12] 10/20/2015  . Hydronephrosis [N13.30] 10/20/2015  . E-coli UTI [N39.0, B96.20]   . Anemia of chronic disease [D63.8]   . Acute encephalopathy [G93.40] 10/19/2014  . Altered mental status [R41.82] 10/19/2014  . Faintness [R55]   . Syncope [R55] 07/06/2014  . Syncope and collapse [R55] 07/05/2014  . CKD (chronic kidney disease), stage IV ([place]) [N18.4] 07/05/2014  . Acute coccygeal pain [M53.3] 07/05/2014  . Occipital scalp laceration [S01.01XA] 07/05/2014  . Diabetes mellitus type 2, diet-controlled ([place]) [E11.9] 07/05/2014  . Protein calorie malnutrition ([place]) [E46] 11/29/2013  . AKI (acute kidney injury) ([place]) [N17.9] 10/30/2013  . Hypercalcemia [E83.52] 06/26/2013  . UTI (lower urinary tract infection) [N39.0] 06/25/2013  . Bladder cancer ([place]) [C67.9] 05/09/2013  . Major depressive disorder, recurrent severe without psychotic features ([place]) [F33.2] 09/03/2005   History of Present Illness:   Identifying data. Mr. Easler is a 56 year old male with history of depression.  Chief complaint. "My wife brought me here."  History of present illness. Information was obtained from the patient and the chart. The patient was brought to the hospital by his wife for worsening of depression and voicing suicidal ideation with a plan to overdose. The patient has been increasingly depressed for the past week, developed suicidal ideation, but did not act upon it. He has been under considerable stress. He was diagnosed with bladder cancer several years ago  but recently developed kidney problems and in May he started hemodialysis. In spite of medical problems the patient has been trying to develop a business but has had multiple setbacks recently. He used to be a Administrator but now receives disability. The patient endorses many symptoms of depression with extremely poor sleep, decreased appetite, anhedonia, feeling of guilt and hopelessness worthlessness, extremely poor energy and concentration, social isolation, crying spells, and now suicidal thinking. He denies any symptoms of psychosis or symptoms suggestive of bipolar mania. He denies any symptoms of anxiety. He does not drink alcohol or use substances.  Past psychiatry history. There is one psychiatric hospitalization in 2007 after an overdose. He has been maintained on Celexa for many years.  Family psychiatric history. Nonreported.  Social history. 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 He lives with his wife and 2 kids. His wedding anniversary is this coming Sunday. He believes that he is not kidney transplant and received treatment for hepatitis.  Total Time spent with patient: 1 hour  Is the patient at risk to self? Yes.    Has the patient been a risk to self in the past 6 months? No.  Has the patient been a risk to self within the distant past? Yes.    Is the patient a risk to others? No.  Has the patient been a risk to others in the past 6 months? No.  Has the patient been a risk to others within the distant past? No.   Prior Inpatient Therapy:   Prior Outpatient Therapy:    Alcohol Screening: 1. How often do you have a drink containing alcohol?: Never 2. How many drinks containing alcohol do you have on  a typical day when you are drinking?: 1 or 2 3. How often do you have six or more drinks on one occasion?: Never Preliminary Score: 0 4. How often during the last year have you found that you were not able to stop drinking once  you had started?: Never 5. How often during the last year have you failed to do what was normally expected from you becasue of drinking?: Never 6. How often during the last year have you needed a first drink in the morning to get yourself going after a heavy drinking session?: Never 7. How often during the last year have you had a feeling of guilt of remorse after drinking?: Never 8. How often during the last year have you been unable to remember what happened the night before because you had been drinking?: Never 9. Have you or someone else been injured as a result of your drinking?: No 10. Has a relative or friend or a doctor or another health worker been concerned about your drinking or suggested you cut down?: No Alcohol Use Disorder Identification Test Final Score (AUDIT): 0 Brief Intervention: AUDIT score less than 7 or less-screening does not suggest unhealthy drinking-brief intervention not indicated Substance Abuse History in the last 12 months:  No. Consequences of Substance Abuse: NA Previous Psychotropic Medications: Yes  Psychological Evaluations: No  Past Medical History:  Past Medical History:  Diagnosis Date  . Acute renal injury ([place]) 11/2013  . Anemia   . Anxiety   . Bladder cancer ([place]) 2013  . Complication of anesthesia    agitation w/awakening in 9/13,OK 01/24/12  . Depression   . Diabetes mellitus type 2, diet-controlled ([place]) PT STOPPED TAKING METFORMIN--  HAS BEEN WATCHING DIET, EXERCISING AND LOSING WT  . Frequency of urination   . GERD (gastroesophageal reflux disease)   . Gout    recent flair -bilateral feet--  STABLE  . Hypertension   . Kidney failure   . Nocturia   . Urgency incontinence     Past Surgical History:  Procedure Laterality Date  . AV FISTULA PLACEMENT Left 05/31/2014   Procedure: LEFT RADIOCEPHALIC ARTERIOVENOUS (AV) FISTULA CREATION ;  Surgeon: Mal Misty, MD;  Location: [facility];  Service: Vascular;  Laterality: Left;  . BACK SURGERY     . BLADDER SURGERY  04/26/2013   at the cancer treatment [facility] of [place] in  [place]    . CYSTOSCOPY W/ RETROGRADES Bilateral 07/10/2012   Procedure: CYSTOSCOPY WITH RETROGRADE PYELOGRAM;  Surgeon: Molli Hazard, MD;  [facility];  Service: Urology;  Laterality: Bilateral;  . CYSTOSCOPY WITH URETHRAL DILATATION  01/24/2012   Procedure: CYSTOSCOPY WITH URETHRAL DILATATION;  Surgeon: Molli Hazard, MD;  Location[facility];  Service: Urology;  Laterality: N/A;  . IR NEPHROSTOMY PLACEMENT LEFT  07/16/2016  . LUMBAR LAMINECTOMY  06-02-2005   W/ RESECTION NERVE ROOT  L4  -  L5  . NEPHROSTOMY TUBE PLACEMENT ([place] [facility])  2015   right   . TRANSURETHRAL RESECTION OF BLADDER TUMOR  12/15/2011   Procedure: TRANSURETHRAL RESECTION OF BLADDER TUMOR (TURBT);  Surgeon: Molli Hazard, MD;  Location: [facility];  Service: Urology;  Laterality: N/A;  2 HRS   . TRANSURETHRAL RESECTION OF BLADDER TUMOR  01/24/2012   Procedure: TRANSURETHRAL RESECTION OF BLADDER TUMOR (TURBT);  Surgeon: Molli Hazard, MD;  Location: [facility];  Service: Urology;  Laterality: N/A;  90 MIN   . TRANSURETHRAL  RESECTION OF BLADDER TUMOR N/A 07/10/2012   Procedure: TRANSURETHRAL RESECTION OF BLADDER TUMOR (TURBT);  Surgeon: Molli Hazard, MD;  Location: [facility];  Service: Urology;  Laterality: N/A;  . ulner artery repair Right 2009   rt -trauma /thrombectomy,repair   Family History:  Family History  Problem Relation Age of Onset  . Diabetes Mellitus II Other   . Hypertension Other   . Bladder Cancer Neg Hx     Tobacco Screening: Have you used any form of tobacco in the last 30 days? (Cigarettes, Smokeless Tobacco, Cigars, and/or Pipes): No Social History:  History  Alcohol Use No     History  Drug Use No    Additional Social History:                           Allergies:   Allergies   Allergen Reactions  . Lisinopril Cough   Lab Results:  Results for orders placed or performed during the hospital encounter of 11/16/16 (from the past 48 hour(s))  Lipid panel     Status: Abnormal   Collection Time: 11/16/16  6:49 AM  Result Value Ref Range   Cholesterol 146 0 - 200 mg/dL   Triglycerides 534 (H) <150 mg/dL   HDL 33 (L) >40 mg/dL   Total CHOL/HDL Ratio 4.4 RATIO   VLDL UNABLE TO CALCULATE IF TRIGLYCERIDE OVER 400 mg/dL 0 - 40 mg/dL   LDL Cholesterol UNABLE TO CALCULATE IF TRIGLYCERIDE OVER 400 mg/dL 0 - 99 mg/dL    Comment:        Total Cholesterol/HDL:CHD Risk Coronary Heart Disease Risk Table                     Men   Women  1/2 Average Risk   3.4   3.3  Average Risk       5.0   4.4  2 X Average Risk   9.6   7.1  3 X Average Risk  23.4   11.0        Use the calculated Patient Ratio above and the CHD Risk Table to determine the patient's CHD Risk.        ATP III CLASSIFICATION (LDL):  <100     mg/dL   Optimal  100-129  mg/dL   Near or Above                    Optimal  130-159  mg/dL   Borderline  160-189  mg/dL   High  >190     mg/dL   Very High   Lithium level     Status: Abnormal   Collection Time: 11/16/16  6:49 AM  Result Value Ref Range   Lithium Lvl <0.06 (L) 0.60 - 1.20 mmol/L  TSH     Status: None   Collection Time: 11/16/16  6:49 AM  Result Value Ref Range   TSH 2.794 0.350 - 4.500 uIU/mL    Comment: Performed by a 3rd Generation assay with a functional sensitivity of <=0.01 uIU/mL.    Blood Alcohol level:  Lab Results  Component Value Date   ETH <5 20/25/4270    Metabolic Disorder Labs:  Lab Results  Component Value Date   HGBA1C 6.1 (H) 10/21/2015   MPG 128 10/21/2015   MPG 111 10/20/2014   No results found for: PROLACTIN Lab Results  Component Value Date   CHOL 146 11/16/2016   TRIG 534 (  H) 11/16/2016   HDL 33 (L) 11/16/2016   CHOLHDL 4.4 11/16/2016   VLDL UNABLE TO CALCULATE IF TRIGLYCERIDE OVER 400 mg/dL 11/16/2016    LDLCALC UNABLE TO CALCULATE IF TRIGLYCERIDE OVER 400 mg/dL 11/16/2016   LDLCALC  10/30/2009    71        Total Cholesterol/HDL:CHD Risk Coronary Heart Disease Risk Table                     Men   Women  1/2 Average Risk   3.4   3.3  Average Risk       5.0   4.4  2 X Average Risk   9.6   7.1  3 X Average Risk  23.4   11.0        Use the calculated Patient Ratio above and the CHD Risk Table to determine the patient's CHD Risk.        ATP III CLASSIFICATION (LDL):  <100     mg/dL   Optimal  100-129  mg/dL   Near or Above                    Optimal  130-159  mg/dL   Borderline  160-189  mg/dL   High  >190     mg/dL   Very High    Current Medications: Current Facility-Administered Medications  Medication Dose Route Frequency Provider Last Rate Last Dose  . acetaminophen (TYLENOL) tablet 650 mg  650 mg Oral Q6H PRN Juwan Vences B, MD      . alum & mag hydroxide-simeth (MAALOX/MYLANTA) 200-200-20 MG/5ML suspension 30 mL  30 mL Oral Q4H PRN Izabella Marcantel B, MD      . citalopram (CELEXA) tablet 40 mg  40 mg Oral q morning - 10a Katia Hannen B, MD   40 mg at 11/16/16 0926  . magnesium hydroxide (MILK OF MAGNESIA) suspension 30 mL  30 mL Oral Daily PRN Zakariah Urwin B, MD      . pantoprazole (PROTONIX) EC tablet 40 mg  40 mg Oral BID Ayaat Jansma B, MD   40 mg at 11/16/16 0808  . sevelamer carbonate (RENVELA) tablet 800 mg  800 mg Oral TID WC Stephanny Tsutsui B, MD   800 mg at 11/16/16 1146  . traZODone (DESYREL) tablet 100 mg  100 mg Oral QHS Pria Klosinski B, MD       PTA Medications: Prescriptions Prior to Admission  Medication Sig Dispense Refill Last Dose  . buPROPion (WELLBUTRIN SR) 100 MG 12 hr tablet Take 100 mg by mouth 2 (two) times daily.   11/14/2016 at 1000  . sodium bicarbonate 650 MG tablet Take 2 tablets (1,300 mg total) by mouth 3 (three) times daily. (Patient taking differently: Take 1,950 mg by mouth 2 (two) times daily. ) 90  tablet 0 11/15/2016 at 2300  . citalopram (CELEXA) 40 MG tablet Take 40 mg by mouth every morning.    11/14/2016 at 1000  . pantoprazole (PROTONIX) 40 MG tablet Take 40 mg by mouth 2 (two) times daily.   6 11/14/2016 at 2100  . sevelamer carbonate (RENVELA) 800 MG tablet Take 1 tablet by mouth 3 (three) times daily.  1 Not Taking at Unknown time    Musculoskeletal: Strength & Muscle Tone: within normal limits Gait & Station: normal Patient leans: N/A  Psychiatric Specialty Exam: I reviewed physical examination performed in the emergency room and agree with the findings. Physical Exam  Nursing note and vitals  reviewed. Psychiatric: His speech is normal. His affect is blunt. He is slowed and withdrawn. Cognition and memory are normal. He expresses impulsivity. He exhibits a depressed mood. He expresses suicidal ideation.    Review of Systems  Constitutional: Positive for malaise/fatigue.  Genitourinary: Positive for frequency.  Psychiatric/Behavioral: Positive for depression and suicidal ideas. The patient has insomnia.   All other systems reviewed and are negative.   Blood pressure 110/62, pulse 76, temperature 98.2 F (36.8 C), temperature source Oral, resp. rate 18, height 5\' 10"  (1.778 m), weight 83.9 kg (185 lb), SpO2 100 %.Body mass index is 26.54 kg/m.  See SRA.                                                  Sleep:  Number of Hours: 1    Treatment Plan Summary: Daily contact with patient to assess and evaluate symptoms and progress in treatment and Medication management   Mr. Fawaz is a 57 year old male with a history of depression that the new onset severe medical problems including need for dialysis admitted for suicidal ideation with a plan to overdose.  1. Suicidal ideation. The patient is able to contract for safety in the hospital.  2. Mood. The patient has been maintained in the community on Celexa 40 mg. Wellbutrin was recently added to his  regimen.  3. Kidney failure. The patient receives dialysis 3 times a week. Nephrology consult is greatly appreciated. The patient is not sure whether or not he is on a transplant list.  4. Metabolic syndrome monitoring. Lipid panel, TSH, hemoglobin A1c are pending.  5. EKG. Pending.  6. GERD. He is on Protonix.  7. Insomnia. We will start Ambien.  8. Disposition. The patient will be discharged to home with family. He will need appointment with a psychiatrist.    Observation Level/Precautions:  15 minute checks  Laboratory:  CBC Chemistry Profile UDS UA  Psychotherapy:    Medications:    Consultations:    Discharge Concerns:    Estimated LOS:  Other:     Physician Treatment Plan for Primary Diagnosis: Major depressive disorder, recurrent severe without psychotic features ([place]) Long Term Goal(s): Improvement in symptoms so as ready for discharge  Short Term Goals: Ability to identify changes in lifestyle to reduce recurrence of condition will improve, Ability to verbalize feelings will improve, Ability to disclose and discuss suicidal ideas, Ability to demonstrate self-control will improve, Ability to identify and develop effective coping behaviors will improve, Ability to maintain clinical measurements within normal limits will improve and Ability to identify triggers associated with substance abuse/mental health issues will improve  Physician Treatment Plan for Secondary Diagnosis: Principal Problem:   Major depressive disorder, recurrent severe without psychotic features ([place]) Active Problems:   Bladder cancer ([place])   CKD (chronic kidney disease), stage IV ([place])   GERD (gastroesophageal reflux disease)   Suicidal ideation  Long Term Goal(s): NA  Short Term Goals: NA  I certify that inpatient services furnished can reasonably be expected to improve the patient's condition.    Orson Slick, MD 8/14/201812:37 PM

## 2016-11-16 NOTE — Progress Notes (Signed)
Recreation Therapy Notes  Date: 08.14.18 Time: 1:00 pm Location: Craft Room  Group Topic: Self-expression  Goal Area(s) Addresses:  Patient will be able to identify a color that represents each emotion. Patient will verbalize benefit of using art as a means of self-expression. Patient will verbalize one emotion experienced while participating in activity.  Behavioral Response: Attentive, Interactive  Intervention: The Colors Within Me  Activity: Patients were given a blank face worksheet and were instructed to pick a color for each emotion they were feeling and show on the worksheet how much of that emotion they were feeling.  Education: LRT educated patients on other forms of self-expression.  Education Outcome: Acknowledges education/In group clarification offered   Clinical Observations/Feedback: Patient picked a color for each emotion and showed how mus of the emotion he was feeling. Patient contributed to group discussion by stating what emotions he was feeling.  Leonette Monarch, LRT/CTRS 11/16/2016 3:54 PM

## 2016-11-16 NOTE — Progress Notes (Signed)
Occlusive dressing to the left fore arm AV Fistula removed, no bleeding, thrill and bruits present.

## 2016-11-16 NOTE — BHH Counselor (Signed)
Adult Comprehensive Assessment  Patient ID: [name] [name], male   DOB: 04-05-1961, 56 y.o.   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 Significant stress. Physical health (include injuries & life threatening diseases): Pt had a recurrance of cancer in May 2018.  Living/Environment/Situation:  Living Arrangements: Spouse/significant other Living conditions (as described by patient or guardian): good How long has patient lived in current situation?: 29 years What is atmosphere in current home: Comfortable  Family History:  Marital status: Married Number of Years Married: 29 What types of issues is patient dealing with in the relationship?: Pt reports no current issues with marriage. Are you sexually active?: No What is your sexual orientation?: heterosexual Has your sexual activity been affected by drugs, alcohol, medication, or emotional stress?: bladder removal affected ability Does patient have children?: Yes How many children?: 4 How is patient's relationship with their children?: 3 adult daughters, one 33 year old son.  Pretty good relationships with children.  Childhood History:  By whom was/is the patient raised?: Both parents Additional childhood history information: Father killed when pt was 53 in a hunting accident.   Description of patient's relationship with caregiver when they were a child: Good relationship with mom when young.   Patient's description of current relationship with people who raised him/her: Good relationship with mother, who is 78. How were you disciplined when you got in trouble as a child/adolescent?: appropriate physical discipline Does patient have siblings?: Yes Number of Siblings: 1 Description of patient's current relationship with siblings: brother died age 10 in a tractor  accident. Did patient suffer any verbal/emotional/physical/sexual abuse as a child?: No Did patient suffer from severe childhood neglect?: No Has patient ever been sexually abused/assaulted/raped as an adolescent or adult?: No Was the patient ever a victim of a crime or a disaster?: No Witnessed domestic violence?: No Has patient been effected by domestic violence as an adult?: No  Education:  Highest grade of school patient has completed: Futures trader degree: [employer] Currently a student?: No Learning disability?: No  Employment/Work Situation:   Employment situation: On disability Where is patient currently employed?: self employed: [employer].  [place] working on [employer]. 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 Why is patient on disability: loss of kidney How long has patient been on disability: 95 Patient's job has been impacted by current illness: Yes Describe how patient's job has been impacted: absent from work, stress getting routes completed currently What is the longest time patient has a held a job?: 19 years Where was the patient employed at that time?: [employer] Has patient ever been in the [employer]?: No Are There Guns or Other Weapons in [place]?: No (Pt owns several guns: nephew has two, daugher has one) Are These Psychologist, educational?: No Who Could Verify You Are Able To Have These Secured:: family members have guns  Financial Resources:   Financial resources: Income from employment, Income from spouse, [name] SSI Does patient have a representative payee or guardian?: No  Alcohol/Substance Abuse:   What has been your use of drugs/alcohol within the last 12 months?: Pt denies any alcohol and drug use. If attempted suicide, did drugs/alcohol play a role in this?: No Alcohol/Substance Abuse Treatment Hx: Denies past history Has alcohol/substance abuse ever caused legal problems?: No  Social Support  System:   Patient's Community Support System: [employer] System: wife, church Type of faith/religion: [employer] How does patient's faith help to cope with current illness?: I know I couldn't go through with suicide.    Leisure/Recreation:   Leisure and Hobbies: "I just work"  Strengths/Needs:   What things does the patient do well?: marriage In what areas does patient struggle / problems for patient: business  Discharge Plan:   Does patient have access to transportation?: Yes Will patient be returning to same living situation after discharge?: Yes Currently receiving community mental health services: No If no, would patient like referral for services when discharged?: Yes (What county?) Does patient have financial barriers related to discharge medications?: No  Summary/Recommendations:   Summary and Recommendations (to be completed by the evaluator): Pt is 56 year old male from [place]. [facility])  Pt diagnised with major depressive disorder and admitted due to increased depression and suicidal throughts.  Recommendations for pt include crisis stabilization, therapeutic milieu, attend and participate in groups, medication management, and development of comprehensive mental wellness plan.  Joanne Chars. 11/16/2016

## 2016-11-16 NOTE — Progress Notes (Signed)
Recreation Therapy Notes  INPATIENT RECREATION THERAPY ASSESSMENT  Patient Details Name: [name] [name] MRN: [id number] DOB: [birth year] Today's Date: 11/16/2016  Patient Stressors: Work, Other (Comment) (Bought some trucks, but cannot find drivers; left kidney has been removed in May and pt is on dialysis)  Coping Skills:   Exercise, Talking, Music, Sports, Other (Comment) [employer])  Personal Challenges: Stress Management  Leisure Interests (2+):  Individual - TV, Individual - Other (Comment) (Going out to eat with family, movies)  Awareness of Community Resources:  Yes  Community Resources:  Park  Current Use: No  If no, Barriers?: Other (Comment) (Time)  Patient Strengths:  Honest, dependable  Patient Identified Areas of Improvement:  Not letting problems get him down, work things out, and make quick decisions  Current Recreation Participation:  Working  Patient Goal for Hospitalization:  Find out what is wrong with him  [place] of Residence:  [place] of Residence:  [place]   Current SI (including self-harm):  No  Current HI:  No  Consent to Intern Participation: N/A   Leonette Monarch, LRT/CTRS 11/16/2016, 1:53 PM

## 2016-11-16 NOTE — Progress Notes (Signed)
Patient has been in the dayroom with peers. Alert and oriented. Pleasant and cooperative. Currently denying thoughts of self harm. Reports that the last time he had SI was Sunday. Patient attended wrap up group. Has no concern so far. Support and encouragements provided. Patient was encouraged to talk to staff as needed. Safety precautions maintained.

## 2016-11-16 NOTE — Progress Notes (Addendum)
Patient ID: [name] [name], male   DOB: 1960-12-25, 56 y.o.   MRN: [id number] Patient admitted from [facility] after hemodialysis and considered stable. 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 We have no income for the past...." Patient has multiple medical problems including ESRD with hemodialysis on M/W/F. Renal diet with 1200 ml of fluid restriction discussed.  Gross skin intact on assessment; no bleeding noted form LFA AV Fistula; contraband search completed with Ms Reino Kent, RN; no contrabands found on patient and in his belongings.   Admission packet provided, pass code established, unit guidelines and treatment agreement discussed, beverages and snacks provided, hygiene products provided, unit orientation and room completed,

## 2016-11-16 NOTE — Progress Notes (Signed)
[name] spoke to patient in the hallway on his way to morning group. [name] [name] will follow-up with patient later.    11/16/16 0900  Clinical Encounter Type  Visited With Patient  Visit Type Initial;Spiritual support  Referral From Nurse

## 2016-11-16 NOTE — Progress Notes (Signed)
Patient is alert and oriented to person, place and time. Skin is warm, dry and intact. No limitations to all four extremities noted. Patient's AV fistula site + for thrill and bruit. Patient was observed ambulating in hall during the shift with a steady gait. Intermittently walks goes to dayroom, no peer interaction noted. Compliant with meal and medications, denies pain at this time. Denies SI and was able to verbally contract for safety. Attends meals and group with peer interaction noted. Milieu remains therapeutic. Patient will be monitored and physician notified of any acute changes.

## 2016-11-16 NOTE — BHH Group Notes (Signed)
[place] Group Notes:  (Nursing/MHT/Case Management/Adjunct)  Date:  11/16/2016  Time:  11:15 PM  Type of Therapy:  Group Therapy  Participation Level:  Active  Participation Quality:  Appropriate  Affect:  Appropriate  Cognitive:  Appropriate  Insight:  Appropriate  Engagement in Group:  Engaged  Modes of Intervention:  Discussion  Summary of Progress/Problems:  [name] [name] 11/16/2016, 11:15 PM

## 2016-11-16 NOTE — BHH Suicide Risk Assessment (Signed)
[facility] Admission Suicide Risk Assessment   Nursing information obtained from:  Patient, Review of record Demographic factors:  Male, Caucasian Current Mental Status:  Suicidal ideation indicated by patient Loss Factors:  Decline in physical health, Financial problems / change in socioeconomic status Historical Factors:  NA Risk Reduction Factors:  Sense of responsibility to family, Employed, Living with another person, especially a relative, Positive therapeutic relationship  Total Time spent with patient: 1 hour Principal Problem: Major depressive disorder, recurrent severe without psychotic features ([place]) Diagnosis:   Patient Active Problem List   Diagnosis Date Noted  . GERD (gastroesophageal reflux disease) [K21.9] 11/16/2016  . Suicidal ideation [R45.851] 11/16/2016  . Pyelonephritis [N12] 10/20/2015  . Hydronephrosis [N13.30] 10/20/2015  . E-coli UTI [N39.0, B96.20]   . Anemia of chronic disease [D63.8]   . Acute encephalopathy [G93.40] 10/19/2014  . Altered mental status [R41.82] 10/19/2014  . Faintness [R55]   . Syncope [R55] 07/06/2014  . Syncope and collapse [R55] 07/05/2014  . CKD (chronic kidney disease), stage IV ([place]) [N18.4] 07/05/2014  . Acute coccygeal pain [M53.3] 07/05/2014  . Occipital scalp laceration [S01.01XA] 07/05/2014  . Diabetes mellitus type 2, diet-controlled ([place]) [E11.9] 07/05/2014  . Protein calorie malnutrition ([place]) [E46] 11/29/2013  . AKI (acute kidney injury) ([place]) [N17.9] 10/30/2013  . Hypercalcemia [E83.52] 06/26/2013  . UTI (lower urinary tract infection) [N39.0] 06/25/2013  . Bladder cancer ([place]) [C67.9] 05/09/2013  . Major depressive disorder, recurrent severe without psychotic features ([place]) [F33.2] 09/03/2005   Subjective Data: suicidal ideation.  Continued Clinical Symptoms:  Alcohol Use Disorder Identification Test Final Score (AUDIT): 0 The "Alcohol Use Disorders Identification Test", Guidelines for Use in Primary Care, Second  Edition.  World Pharmacologist [facility]). Score between 0-7:  no or low risk or alcohol related problems. Score between 8-15:  moderate risk of alcohol related problems. Score between 16-19:  high risk of alcohol related problems. Score 20 or above:  warrants further diagnostic evaluation for alcohol dependence and treatment.   CLINICAL FACTORS:   Depression:   Hopelessness Insomnia Severe Medical Diagnoses and Treatments/Surgeries   Musculoskeletal: Strength & Muscle Tone: within normal limits Gait & Station: normal Patient leans: N/A  Psychiatric Specialty Exam: Physical Exam  Nursing note and vitals reviewed. Psychiatric: His speech is normal. His affect is blunt. He is slowed and withdrawn. Cognition and memory are normal. He expresses impulsivity. He exhibits a depressed mood. He expresses suicidal ideation. He expresses suicidal plans.    Review of Systems  Constitutional: Positive for malaise/fatigue.  Genitourinary: Positive for frequency.  Psychiatric/Behavioral: Positive for depression and suicidal ideas. The patient has insomnia.   All other systems reviewed and are negative.   Blood pressure 110/62, pulse 76, temperature 98.2 F (36.8 C), temperature source Oral, resp. rate 18, height 5\' 10"  (1.778 m), weight 83.9 kg (185 lb), SpO2 100 %.Body mass index is 26.54 kg/m.  General Appearance: Casual  Eye Contact:  Good  Speech:  Clear and Coherent  Volume:  Decreased  Mood:  Depressed and Hopeless  Affect:  Flat  Thought Process:  Goal Directed and Descriptions of Associations: Intact  Orientation:  Full (Time, Place, and Person)  Thought Content:  WDL  Suicidal Thoughts:  Yes.  with intent/plan  Homicidal Thoughts:  No  Memory:  Immediate;   Fair Recent;   Fair Remote;   Fair  Judgement:  Fair  Insight:  Fair  Psychomotor Activity:  Psychomotor Retardation  Concentration:  Concentration: Fair and Attention Span: Fair  Recall:  Roundup of Knowledge:   Fair  Language:  Fair  Akathisia:  No  Handed:  Right  AIMS (if indicated):     Assets:  Communication Skills Desire for Improvement Financial Resources/Insurance Housing Intimacy Resilience Social Support Transportation Vocational/Educational  ADL's:  Intact  Cognition:  WNL  Sleep:  Number of Hours: 1      COGNITIVE FEATURES THAT CONTRIBUTE TO RISK:  None    SUICIDE RISK:   Severe:  Frequent, intense, and enduring suicidal ideation, specific plan, no subjective intent, but some objective markers of intent (i.e., choice of lethal method), the method is accessible, some limited preparatory behavior, evidence of impaired self-control, severe dysphoria/symptomatology, multiple risk factors present, and few if any protective factors, particularly a lack of social support.  PLAN OF CARE: Hospital admission, medication management, discharge planning.  Mr. Luecke is a 56 year old male with a history of depression that the new onset severe medical problems including need for dialysis admitted for suicidal ideation with a plan to overdose.  1. Suicidal ideation. The patient is able to contract for safety in the hospital.  2. Mood. The patient has been maintained in the community on Celexa 40 mg. Wellbutrin was recently added to his regimen.  3. Kidney failure. The patient receives dialysis 3 times a week. Nephrology consult is greatly appreciated. The patient is not sure whether or not he is on a transplant list.  4. Metabolic syndrome monitoring. Lipid panel, TSH, hemoglobin A1c are pending.  5. EKG. Pending.  6. GERD. He is on Protonix.  7. Insomnia. We will start Ambien.  8. Disposition. The patient will be discharged to home with family. He will need appointment with a psychiatrist.     I certify that inpatient services furnished can reasonably be expected to improve the patient's condition.   Orson Slick, MD 11/16/2016, 12:31 PM

## 2016-11-16 NOTE — BHH Group Notes (Signed)
Goals Group Date/Time: 11/16/2016 9:00 AM Type of Therapy and Topic: Group Therapy: Goals Group: SMART Goals   Participation Level: Moderate  Description of Group:    The purpose of a daily goals group is to assist and guide patients in setting recovery/wellness-related goals. The objective is to set goals as they relate to the crisis in which they were admitted. Patients will be using SMART goal modalities to set measurable goals. Characteristics of realistic goals will be discussed and patients will be assisted in setting and processing how one will reach their goal. Facilitator will also assist patients in applying interventions and coping skills learned in psycho-education groups to the SMART goal and process how one will achieve defined goal.   Therapeutic Goals:   -Patients will develop and document one goal related to or their crisis in which brought them into treatment.  -Patients will be guided by LCSW using SMART goal setting modality in how to set a measurable, attainable, realistic and time sensitive goal.  -Patients will process barriers in reaching goal.  -Patients will process interventions in how to overcome and successful in reaching goal.   Patient's Goal:Pt goal was to meet with MD regarding his medication today and to attend groups.   Therapeutic Modalities:  Motivational Interviewing  Art gallery manager  SMART goals setting   Lurline Idol, [place]

## 2016-11-17 LAB — CBC
HCT: 31.3 % — ABNORMAL LOW (ref 40.0–52.0)
Hemoglobin: 10.7 g/dL — ABNORMAL LOW (ref 13.0–18.0)
MCH: 30.9 pg (ref 26.0–34.0)
MCHC: 34.2 g/dL (ref 32.0–36.0)
MCV: 90.2 fL (ref 80.0–100.0)
Platelets: 237 10*3/uL (ref 150–440)
RBC: 3.47 MIL/uL — ABNORMAL LOW (ref 4.40–5.90)
RDW: 16.4 % — ABNORMAL HIGH (ref 11.5–14.5)
WBC: 6.3 10*3/uL (ref 3.8–10.6)

## 2016-11-17 LAB — MISC LABCORP TEST (SEND OUT): LABCORP TEST CODE: 1453

## 2016-11-17 MED ORDER — EPOETIN ALFA 4000 UNIT/ML IJ SOLN
4000.0000 [IU] | INTRAMUSCULAR | Status: DC
  Administered 2016-11-17 – 2016-11-19 (×2): 4000 [IU] via INTRAVENOUS
  Filled 2016-11-17 (×2): qty 1

## 2016-11-17 MED ORDER — TEMAZEPAM 15 MG PO CAPS: 15.0000 mg | ORAL_CAPSULE | Freq: Every evening | ORAL | Status: DC | PRN

## 2016-11-17 MED ORDER — QUETIAPINE FUMARATE 100 MG PO TABS
100.0000 mg | ORAL_TABLET | Freq: Every day | ORAL | Status: DC
  Administered 2016-11-17: 100 mg via ORAL
  Filled 2016-11-17: qty 1

## 2016-11-17 MED ORDER — TRAZODONE HCL 100 MG PO TABS
100.0000 mg | ORAL_TABLET | Freq: Every day | ORAL | Status: DC
  Filled 2016-11-17: qty 1

## 2016-11-17 MED ORDER — TRAZODONE HCL 100 MG PO TABS
100.0000 mg | ORAL_TABLET | Freq: Once | ORAL | Status: DC
  Administered 2016-11-17: 100 mg via ORAL

## 2016-11-17 NOTE — Plan of Care (Signed)
Problem: Education: Goal: Emotional status will improve Outcome: Progressing Pt compliant with treatment process and involved in self care

## 2016-11-17 NOTE — Progress Notes (Signed)
HD completed without issue. Unable to UF d/t hypotension. Patient remained asymptomatic throughout treatment. Post vitals stable.

## 2016-11-17 NOTE — Progress Notes (Signed)
Patient is alert and oriented to person, place and time. Skin is warm, dry and intact. Patient currently denies SI but stating that he is still depressed. Patient was observed ambulating in hall during the shift with a steady gait. Patient had dialysis today. Attends meals with minimal peer interaction noted. Milieu remains therapeutic. Patient will be monitored and physician notified of any acute changes.

## 2016-11-17 NOTE — BHH Group Notes (Signed)
[place] LCSW Group Therapy   11/17/2016  9:30 am   Type of Therapy: Group Therapy   Participation Level: Patient invited but did not attend.    Glorious Peach, MSW, LCSWA 11/17/2016, 10:54AM

## 2016-11-17 NOTE — Plan of Care (Signed)
Problem: Education: Goal: Mental status will improve Outcome: Progressing Alert and oriented. Denying thoughts of self harm

## 2016-11-17 NOTE — Progress Notes (Signed)
Patient returned from dialysis, no issues reported. Dressing to AV fistula intact. No bleeding noted to site. Food and fluid provided to patient, medications administered.  Will continue to monitor.

## 2016-11-17 NOTE — Progress Notes (Signed)
Post hd assessment unchanged  

## 2016-11-17 NOTE — Plan of Care (Signed)
Problem: Self-Concept: Goal: Ability to verbalize positive feelings about self will improve Outcome: Progressing "its getting better"

## 2016-11-17 NOTE — Progress Notes (Signed)
Patient stayed in the dayroom watching TV until bed time. Presented to the medication room and had his bedtime medications. Calm and cooperative and continues to deny thoughts of self harm but remains hopeless. Was encouraged to talk to staff and express his feelings and concerns as needed. Currently in room awake. Safety precautions reinforced.

## 2016-11-17 NOTE — Progress Notes (Signed)
HD initiated via L AVF without issue. Patient resting quietly in chair. Sitter at chairside in HD unit

## 2016-11-17 NOTE — Plan of Care (Signed)
Problem: Health Behavior/Discharge Planning: Goal: Compliance with treatment plan for underlying cause of condition will improve Outcome: Progressing Patient taking medications as prescribed and attending groups

## 2016-11-17 NOTE — Progress Notes (Signed)
Pre hd 

## 2016-11-17 NOTE — Progress Notes (Signed)
[facility]  ROUNDING NOTE   Subjective:   Seen and examined on hemodialysis. UF of 0.5 liter.  BP 92/54 - asymptomatic.   Objective:  Vital signs in last 24 hours:  Temp:  [98 F (36.7 C)] 98 F (36.7 C) (08/15 0925) Pulse Rate:  [69-80] 75 (08/15 1040) Resp:  [12-20] 15 (08/15 1040) BP: (78-107)/(45-58) 92/54 (08/15 1040) SpO2:  [94 %-100 %] 98 % (08/15 1040) Weight:  [83.9 kg (184 lb 15.5 oz)] 83.9 kg (184 lb 15.5 oz) (08/15 0925)  Weight change:  Filed Weights   11/16/16 0224 11/17/16 0925  Weight: 83.9 kg (185 lb) 83.9 kg (184 lb 15.5 oz)    Intake/Output: I/O last 3 completed shifts: In: 7 [P.O.:720] Out: -    Intake/Output this shift:  Total I/O In: 480 [P.O.:480] Out: -   Physical Exam: General: NAD,   Head: Normocephalic, atraumatic. Moist oral mucosal membranes  Eyes: Anicteric, PERRL  Neck: Supple, trachea midline  Lungs:  Clear to auscultation  Heart: Regular rate and rhythm  Abdomen:  Soft, nontender,   Extremities: no peripheral edema.  Neurologic: Nonfocal, moving all four extremities  Skin: No lesions  Access: Left forearm AVF    Basic Metabolic Panel:  Recent Labs Lab 11/15/16 1254 11/15/16 1930  NA 140  139 139  K 3.2*  3.2* 3.2*  CL 103  103 105  CO2 24  26 24   GLUCOSE 109*  115* 93  BUN 40*  40* 42*  CREATININE 5.72*  5.88* 5.74*  CALCIUM 9.4  9.4 9.2  PHOS 3.4 4.0    Liver Function Tests:  Recent Labs Lab 11/10/16 1354 11/15/16 1254 11/15/16 1930  AST 26 19  --   ALT 44 36  --   ALKPHOS 94 85  --   BILITOT 0.8 0.6  --   PROT 7.2 6.8  --   ALBUMIN 4.1 4.0  4.0 3.9    Recent Labs Lab 11/10/16 1354  LIPASE 37    Recent Labs Lab 11/10/16 1354  AMMONIA 29    CBC:  Recent Labs Lab 11/15/16 1254 11/15/16 1930  WBC 7.6 7.6  HGB 10.2* 10.1*  HCT 30.5* 29.6*  MCV 88.2 87.6  PLT 256 246    Cardiac Enzymes: No results for input(s): CKTOTAL, CKMB, CKMBINDEX, TROPONINI in the  last 168 hours.  BNP: Invalid input(s): POCBNP  CBG: No results for input(s): GLUCAP in the last 168 hours.  Microbiology: Results for orders placed or performed during the hospital encounter of 10/20/15  Urine culture     Status: Abnormal   Collection Time: 10/20/15  4:49 AM  Result Value Ref Range Status   Specimen Description URINE, CLEAN CATCH  Final   Special Requests NONE  Final   Culture MULTIPLE SPECIES PRESENT, SUGGEST RECOLLECTION (A)  Final   Report Status 10/21/2015 FINAL  Final  Blood culture (routine x 2)     Status: None   Collection Time: 10/20/15  8:34 AM  Result Value Ref Range Status   Specimen Description BLOOD RIGHT HAND  Final   Special Requests BOTTLES DRAWN AEROBIC AND ANAEROBIC 5ML  Final   Culture   Final    NO GROWTH 5 DAYS Performed at [facility]    Report Status 10/25/2015 FINAL  Final  Blood culture (routine x 2)     Status: None   Collection Time: 10/20/15  8:34 AM  Result Value Ref Range Status   Specimen Description BLOOD RIGHT HAND  Final   Special Requests BOTTLES DRAWN AEROBIC AND ANAEROBIC 5ML  Final   Culture   Final    NO GROWTH 5 DAYS Performed at [facility]    Report Status 10/25/2015 FINAL  Final    Coagulation Studies: No results for input(s): LABPROT, INR in the last 72 hours.  Urinalysis: No results for input(s): COLORURINE, LABSPEC, PHURINE, GLUCOSEU, HGBUR, BILIRUBINUR, KETONESUR, PROTEINUR, UROBILINOGEN, NITRITE, LEUKOCYTESUR in the last 72 hours.  Invalid input(s): APPERANCEUR    Imaging: No results found.   Medications:    . pantoprazole  40 mg Oral BID  . sevelamer carbonate  800 mg Oral TID WC  . traZODone  100 mg Oral QHS  . venlafaxine XR  150 mg Oral Q breakfast  . vitamin B-12  1,000 mcg Oral Daily   acetaminophen, alum & mag hydroxide-simeth, magnesium hydroxide  Assessment/ Plan:  [name] is a 56 y.o. white male with end stage renal disease on hemodialysis MWF [place], hypertension, bladder cancer, left nephrectomy, depression, GERD, gout, anemia who was admitted to [facility] on 11/16/2016 for Depression   M[facility] AVF  1. End Stage Renal Disease: seen and examined on hemodialysis. Tolerating hemodialysis treatment well.   2. Hypertension: Hypotensive on treatment. Not currently on any medications.   3. Anemia of chronic kidney disease: hemoglobin 10.1 - Mircera as outpatient.  - EPO with HD treatment  4. Secondary Hyperparathyroidism: phosphorus at goal.  - Continue sevelamer   LOS: 1 [name] [name] 8/15/201810:40 AM

## 2016-11-17 NOTE — Progress Notes (Signed)
[facility] MD Progress Note  11/17/2016 9:38 AM [name] [name]  MRN:  [id number]  Subjective:   11/17/2016. [name] [name] feels slightly better today. He just spoke with his wife and mother. He completed dialysis today and participated in programming in the afternoon. He did not come to treatment team because of dialysis today. There are no somatic complaints or side effects of medications. He did not sleep well last night in spite of additional Trazodone given in the middle of the night that gave him a headache. We will start Seroquel tonight.  Per nursing: Patient stayed in the dayroom watching TV until bed time. Presented to the medication room and had his bedtime medications. Calm and cooperative and continues to deny thoughts of self harm but remains hopeless. Was encouraged to talk to staff and express his feelings and concerns as needed. Currently in room awake. Safety precautions reinforced.   Principal Problem: Major depressive disorder, recurrent severe without psychotic features ([place]) Diagnosis:   Patient Active Problem List   Diagnosis Date Noted  . GERD (gastroesophageal reflux disease) [K21.9] 11/16/2016  . Suicidal ideation [R45.851] 11/16/2016  . Pyelonephritis [N12] 10/20/2015  . Hydronephrosis [N13.30] 10/20/2015  . E-coli UTI [N39.0, B96.20]   . Anemia of chronic disease [D63.8]   . Acute encephalopathy [G93.40] 10/19/2014  . Altered mental status [R41.82] 10/19/2014  . Faintness [R55]   . Syncope [R55] 07/06/2014  . Syncope and collapse [R55] 07/05/2014  . CKD (chronic kidney disease), stage IV ([place]) [N18.4] 07/05/2014  . Acute coccygeal pain [M53.3] 07/05/2014  . Occipital scalp laceration [S01.01XA] 07/05/2014  . Diabetes mellitus type 2, diet-controlled ([place]) [E11.9] 07/05/2014  . Protein calorie malnutrition ([place]) [E46] 11/29/2013  . AKI (acute kidney injury) ([place]) [N17.9] 10/30/2013  . Hypercalcemia [E83.52] 06/26/2013  . UTI (lower urinary tract infection) [N39.0]  06/25/2013  . Bladder cancer ([place]) [C67.9] 05/09/2013  . Major depressive disorder, recurrent severe without psychotic features ([place]) [F33.2] 09/03/2005   Total Time spent with patient: 30 minutes  Past Psychiatric History: depression.  Past Medical History:  Past Medical History:  Diagnosis Date  . Acute renal injury ([place]) 11/2013  . Anemia   . Anxiety   . Bladder cancer ([place]) 2013  . Complication of anesthesia    agitation w/awakening in 9/13,OK 01/24/12  . Depression   . Diabetes mellitus type 2, diet-controlled ([place]) PT STOPPED TAKING METFORMIN--  HAS BEEN WATCHING DIET, EXERCISING AND LOSING WT  . Frequency of urination   . GERD (gastroesophageal reflux disease)   . Gout    recent flair -bilateral feet--  STABLE  . Hypertension   . Kidney failure   . Nocturia   . Urgency incontinence     Past Surgical History:  Procedure Laterality Date  . AV FISTULA PLACEMENT Left 05/31/2014   Procedure: LEFT RADIOCEPHALIC ARTERIOVENOUS (AV) FISTULA CREATION ;  Surgeon: Mal Misty, MD;  Location: [facility];  Service: Vascular;  Laterality: Left;  . BACK SURGERY    . BLADDER SURGERY  04/26/2013   at the cancer treatment center of [place] in  [place]    . CYSTOSCOPY W/ RETROGRADES Bilateral 07/10/2012   Procedure: CYSTOSCOPY WITH RETROGRADE PYELOGRAM;  Surgeon: Molli Hazard, MD;  Location[facility];  Service: Urology;  Laterality: Bilateral;  . CYSTOSCOPY WITH URETHRAL DILATATION  01/24/2012   Procedure: CYSTOSCOPY WITH URETHRAL DILATATION;  Surgeon: Molli Hazard, MD;  Location: [facility];  Service: Urology;  Laterality: N/A;  . IR NEPHROSTOMY PLACEMENT  LEFT  07/16/2016  . LUMBAR LAMINECTOMY  06-02-2005   W/ RESECTION NERVE ROOT  L4  -  L5  . NEPHROSTOMY TUBE PLACEMENT ([place] HX)  2015   right   . TRANSURETHRAL RESECTION OF BLADDER TUMOR  12/15/2011   Procedure: TRANSURETHRAL RESECTION OF BLADDER TUMOR (TURBT);  Surgeon: Molli Hazard, MD;  Location: [facility];  Service: Urology;  Laterality: N/A;  2 HRS   . TRANSURETHRAL RESECTION OF BLADDER TUMOR  01/24/2012   Procedure: TRANSURETHRAL RESECTION OF BLADDER TUMOR (TURBT);  Surgeon: Molli Hazard, MD;  Location: [facility];  Service: Urology;  Laterality: N/A;  90 MIN   . TRANSURETHRAL RESECTION OF BLADDER TUMOR N/A 07/10/2012   Procedure: TRANSURETHRAL RESECTION OF BLADDER TUMOR (TURBT);  Surgeon: Molli Hazard, MD;  Location: [facility];  Service: Urology;  Laterality: N/A;  . ulner artery repair Right 2009   rt -trauma /thrombectomy,repair   Family History:  Family History  Problem Relation Age of Onset  . Diabetes Mellitus II Other   . Hypertension Other   . Bladder Cancer Neg Hx    Family Psychiatric  History: none reported. Social History:  History  Alcohol Use No     History  Drug Use No    Social History   Social History  . Marital status: Married    Spouse name: N/A  . Number of children: N/A  . Years of education: N/A   Social History Main Topics  . Smoking status: Former Smoker    Packs/day: 1.00    Years: 16.00    Types: Cigarettes    Quit date: 01/20/1992  . Smokeless tobacco: Former Systems developer    Types: [place] date: 01/20/1992  . Alcohol use No  . Drug use: No  . Sexual activity: Not Currently   Other Topics Concern  . None   Social History Narrative  . None   Additional Social History:                         Sleep: Fair  Appetite:  Fair  Current Medications: Current Facility-Administered Medications  Medication Dose Route Frequency Provider Last Rate Last Dose  . acetaminophen (TYLENOL) tablet 650 mg  650 mg Oral Q6H PRN Gage Weant B, MD      . alum & mag hydroxide-simeth (MAALOX/MYLANTA) 200-200-20 MG/5ML suspension 30 mL  30 mL Oral Q4H PRN Payton Prinsen B, MD      . magnesium hydroxide (MILK OF MAGNESIA) suspension 30 mL   30 mL Oral Daily PRN Mayline Dragon B, MD      . pantoprazole (PROTONIX) EC tablet 40 mg  40 mg Oral BID Montanna Mcbain B, MD   40 mg at 11/16/16 1643  . sevelamer carbonate (RENVELA) tablet 800 mg  800 mg Oral TID WC Phylisha Dix B, MD   800 mg at 11/16/16 1642  . traZODone (DESYREL) tablet 100 mg  100 mg Oral QHS Lenya Sterne B, MD      . venlafaxine XR (EFFEXOR-XR) 24 hr capsule 150 mg  150 mg Oral Q breakfast Brack Shaddock B, MD      . vitamin B-12 (CYANOCOBALAMIN) tablet 1,000 mcg  1,000 mcg Oral Daily Aurora Rody B, MD   1,000 mcg at 11/16/16 7829    Lab Results:  Results for orders placed or performed during the hospital encounter of 11/16/16 (from the past 48 hour(s))  Lipid panel  Status: Abnormal   Collection Time: 11/16/16  6:49 AM  Result Value Ref Range   Cholesterol 146 0 - 200 mg/dL   Triglycerides 534 (H) <150 mg/dL   HDL 33 (L) >40 mg/dL   Total CHOL/HDL Ratio 4.4 RATIO   VLDL UNABLE TO CALCULATE IF TRIGLYCERIDE OVER 400 mg/dL 0 - 40 mg/dL   LDL Cholesterol UNABLE TO CALCULATE IF TRIGLYCERIDE OVER 400 mg/dL 0 - 99 mg/dL    Comment:        Total Cholesterol/HDL:CHD Risk Coronary Heart Disease Risk Table                     Men   Women  1/2 Average Risk   3.4   3.3  Average Risk       5.0   4.4  2 X Average Risk   9.6   7.1  3 X Average Risk  23.4   11.0        Use the calculated Patient Ratio above and the CHD Risk Table to determine the patient's CHD Risk.        ATP III CLASSIFICATION (LDL):  <100     mg/dL   Optimal  100-129  mg/dL   Near or Above                    Optimal  130-159  mg/dL   Borderline  160-189  mg/dL   High  >190     mg/dL   Very High   Lithium level     Status: Abnormal   Collection Time: 11/16/16  6:49 AM  Result Value Ref Range   Lithium Lvl <0.06 (L) 0.60 - 1.20 mmol/L  TSH     Status: None   Collection Time: 11/16/16  6:49 AM  Result Value Ref Range   TSH 2.794 0.350 - 4.500 uIU/mL     Comment: Performed by a 3rd Generation assay with a functional sensitivity of <=0.01 uIU/mL.  Miscellaneous [facility] test (send-out)     Status: None   Collection Time: 11/16/16  6:49 AM  Result Value Ref Range   [facility] test code 1,453    [facility] test name HGBA1C    Misc [facility] result COMMENT     Comment: (NOTE) Test Ordered: 119147 Hemoglobin A1c Hemoglobin A1c                 6.2         [H ] %        BN     Reference Range: 4.8-5.6                                       Prediabetes: 5.7 - 6.4         Diabetes: >6.4         Glycemic control for adults with diabetes: <7.0 Performed At: [facility] [place], [place] [id number] Lindon Romp MD QM:[id number]   Vitamin B12     Status: None   Collection Time: 11/16/16  6:49 AM  Result Value Ref Range   Vitamin B-12 230 180 - 914 pg/mL    Comment: (NOTE) This assay is not validated for testing neonatal or myeloproliferative syndrome specimens for Vitamin B12 levels. Performed at [facility], [place] [address] [place], [place] [address]     Blood Alcohol level:  Lab Results  Component Value Date   ETH <5 58/12/9831    Metabolic Disorder Labs: Lab Results  Component Value Date   HGBA1C 6.1 (H) 10/21/2015   MPG 128 10/21/2015   MPG 111 10/20/2014   No results found for: PROLACTIN Lab Results  Component Value Date   CHOL 146 11/16/2016   TRIG 534 (H) 11/16/2016   HDL 33 (L) 11/16/2016   CHOLHDL 4.4 11/16/2016   VLDL UNABLE TO CALCULATE IF TRIGLYCERIDE OVER 400 mg/dL 11/16/2016   LDLCALC UNABLE TO CALCULATE IF TRIGLYCERIDE OVER 400 mg/dL 11/16/2016   LDLCALC  10/30/2009    71        Total Cholesterol/HDL:CHD Risk Coronary Heart Disease Risk Table                     Men   Women  1/2 Average Risk   3.4   3.3  Average Risk       5.0   4.4  2 X Average Risk   9.6   7.1  3 X Average Risk  23.4   11.0        Use the calculated Patient Ratio above and the CHD Risk Table to determine the  patient's CHD Risk.        ATP III CLASSIFICATION (LDL):  <100     mg/dL   Optimal  100-129  mg/dL   Near or Above                    Optimal  130-159  mg/dL   Borderline  160-189  mg/dL   High  >190     mg/dL   Very High    Physical Findings: AIMS:  , ,  ,  ,    CIWA:    COWS:     Musculoskeletal: Strength & Muscle Tone: within normal limits Gait & Station: normal Patient leans: N/A  Psychiatric Specialty Exam: Physical Exam  Nursing note and vitals reviewed. Psychiatric: His speech is normal. His affect is blunt. He is slowed and withdrawn. Cognition and memory are normal. He expresses impulsivity. He exhibits a depressed mood. He expresses suicidal ideation. He expresses suicidal plans.    Review of Systems  Psychiatric/Behavioral: Positive for depression and suicidal ideas. The patient has insomnia.   All other systems reviewed and are negative.   Blood pressure 110/62, pulse 76, temperature 98.2 F (36.8 C), temperature source Oral, resp. rate 18, height 5\' 10"  (1.778 m), weight 83.9 kg (185 lb), SpO2 100 %.Body mass index is 26.54 kg/m.  General Appearance: Casual  Eye Contact:  Good  Speech:  Clear and Coherent  Volume:  Decreased  Mood:  Depressed and Hopeless  Affect:  Blunt  Thought Process:  Goal Directed and Descriptions of Associations: Intact  Orientation:  Full (Time, Place, and Person)  Thought Content:  WDL  Suicidal Thoughts:  Yes.  with intent/plan  Homicidal Thoughts:  No  Memory:  Immediate;   Fair Recent;   Fair Remote;   Fair  Judgement:  Fair  Insight:  Fair  Psychomotor Activity:  Psychomotor Retardation  Concentration:  Concentration: Fair and Attention Span: Fair  Recall:  [employer] of Knowledge:  Fair  Language:  Fair  Akathisia:  No  Handed:  Right  AIMS (if indicated):     Assets:  Communication Skills Desire for Improvement Financial Resources/Insurance Housing Intimacy Resilience Social  Support Transportation Vocational/Educational  ADL's:  Intact  Cognition:  WNL  Sleep:  Number  of Hours: 4.5     Treatment Plan Summary: Daily contact with patient to assess and evaluate symptoms and progress in treatment and Medication management   [name] [name] is a 56 year old male with a history of depression that the new onset severe medical problems including need for dialysis admitted for suicidal ideation with a plan to overdose.  1. Suicidal ideation. The patient is able to contract for safety in the hospital.  2. Mood. We started Effexor for depression. We will add Seroquel for depression, mood stabilization and insomnia.   3. Kidney failure. The patient receives dialysis 3 times a week. Nephrology consult is greatly appreciated. The patient is not sure whether or not he is on a transplant list.  4. Metabolic syndrome monitoring. Lipid panel shows elevated triglycerides, normal TSH and hemoglobin A1c of 6.2.   5. EKG. Normal sinus rhythm, QTc 469.  6. GERD. He is on Protonix.  7. Insomnia. Trazodone was not helpful. He is on Seroquel. Restoril as needed available.   8. Low Vit B12. We started supplementation.  9. Disposition. The patient will be discharged to home with family. He will need appointment with a psychiatrist.   Orson Slick, MD 11/17/2016, 9:38 AM

## 2016-11-17 NOTE — Plan of Care (Signed)
Problem: Education: Goal: Verbalization of understanding the information provided will improve Outcome: Progressing Pt understands his mental and physical health condition

## 2016-11-17 NOTE — Progress Notes (Signed)
Post hd vitals 

## 2016-11-17 NOTE — Progress Notes (Signed)
Recreation Therapy Notes  Date: 08.15.18 Time: 1:00 pm Location: Craft Room  Group Topic: Self-esteem  Goal Area(s) Addresses:  Patient will be able to identify benefit of self-esteem. Patient will be able to identify ways to increase self-esteem.  Behavioral Response: Did not attend  Intervention: Self Portrait  Activity: Patients were given blank face worksheets and were instructed to draw their self-portrait of how they felt. Patients were given construction paper and were instructed to write a positive trait about themselves and positive traits about peers. After patients read positive comments, patients were given blank face worksheets and were instructed to draw their self-portraits of how they felt.  Education: LRT educated patients on ways they can increase their self-esteem.  Education Outcome: Patient did not attend group.   Clinical Observations/Feedback: Patient did not attend group.  Leonette Monarch, LRT/CTRS 11/17/2016 1:47 PM

## 2016-11-17 NOTE — BHH Group Notes (Signed)
[place] Group Notes:  (Nursing/MHT/Case Management/Adjunct)  Date:  11/17/2016  Time:  10:23 PM  Type of Therapy:  Group Therapy  Participation Level:  Active  Participation Quality:  Appropriate  Affect:  Appropriate  Cognitive:  Alert  Insight:  Good  Engagement in Group:  Engaged  Modes of Intervention:  Support  Summary of Progress/Problems:  [name] [name] 11/17/2016, 10:23 PM

## 2016-11-17 NOTE — Plan of Care (Signed)
Problem: Self-Concept: Goal: Ability to disclose and discuss suicidal ideas will improve Outcome: Progressing Pt able to express his feelings and concerns

## 2016-11-17 NOTE — Plan of Care (Signed)
Problem: Health Behavior/Discharge Planning: Goal: Compliance with therapeutic regimen will improve Outcome: Progressing Pt emotional status discussed. Taking medications as prescribed

## 2016-11-17 NOTE — Tx Team (Signed)
Interdisciplinary Treatment and Diagnostic Plan Update  11/17/2016 Time of Session: 1100 [name] [name] MRN: [id number]  Principal Diagnosis: Major depressive disorder, recurrent severe without psychotic features ([place])  Secondary Diagnoses: Principal Problem:   Major depressive disorder, recurrent severe without psychotic features ([place]) Active Problems:   Bladder cancer ([place])   CKD (chronic kidney disease), stage IV ([facility])   GERD (gastroesophageal reflux disease)   Suicidal ideation   Current Medications:  Current Facility-Administered Medications  Medication Dose Route Frequency Provider Last Rate Last Dose  . acetaminophen (TYLENOL) tablet 650 mg  650 mg Oral Q6H PRN Pucilowska, Jolanta B, MD      . alum & mag hydroxide-simeth (MAALOX/MYLANTA) 200-200-20 MG/5ML suspension 30 mL  30 mL Oral Q4H PRN Pucilowska, Jolanta B, MD      . epoetin alfa (EPOGEN,PROCRIT) injection 4,000 Units  4,000 Units Intravenous Q M,W,F-HD Kolluru, Sarath, MD   4,000 Units at 11/17/16 1050  . magnesium hydroxide (MILK OF MAGNESIA) suspension 30 mL  30 mL Oral Daily PRN Pucilowska, Jolanta B, MD      . pantoprazole (PROTONIX) EC tablet 40 mg  40 mg Oral BID Pucilowska, Jolanta B, MD   40 mg at 11/16/16 1643  . sevelamer carbonate (RENVELA) tablet 800 mg  800 mg Oral TID WC Pucilowska, Jolanta B, MD   800 mg at 11/16/16 1642  . traZODone (DESYREL) tablet 100 mg  100 mg Oral QHS Pucilowska, Jolanta B, MD      . venlafaxine XR (EFFEXOR-XR) 24 hr capsule 150 mg  150 mg Oral Q breakfast Pucilowska, Jolanta B, MD      . vitamin B-12 (CYANOCOBALAMIN) tablet 1,000 mcg  1,000 mcg Oral Daily Pucilowska, Jolanta B, MD   1,000 mcg at 11/16/16 1808   PTA Medications: Prescriptions Prior to Admission  Medication Sig Dispense Refill Last Dose  . buPROPion (WELLBUTRIN SR) 100 MG 12 hr tablet Take 100 mg by mouth 2 (two) times daily.   11/14/2016 at 1000  . sodium bicarbonate 650 MG tablet Take 2 tablets (1,300 mg total)  by mouth 3 (three) times daily. (Patient taking differently: Take 1,950 mg by mouth 2 (two) times daily. ) 90 tablet 0 11/15/2016 at 2300  . citalopram (CELEXA) 40 MG tablet Take 40 mg by mouth every morning.    11/14/2016 at 1000  . pantoprazole (PROTONIX) 40 MG tablet Take 40 mg by mouth 2 (two) times daily.   6 11/14/2016 at 2100  . sevelamer carbonate (RENVELA) 800 MG tablet Take 1 tablet by mouth 3 (three) times daily.  1 Not Taking at Unknown time    Patient Stressors: Financial difficulties Health problems  Patient Strengths: Ability for insight Average or above average intelligence Capable of independent living Motivation for treatment/growth Supportive family/friends  Treatment Modalities: Medication Management, Group therapy, Case management,  1 to 1 session with clinician, Psychoeducation, Recreational therapy.   Physician Treatment Plan for Primary Diagnosis: Major depressive disorder, recurrent severe without psychotic features ([place]) Long Term Goal(s): Improvement in symptoms so as ready for discharge NA   Short Term Goals: Ability to identify changes in lifestyle to reduce recurrence of condition will improve Ability to verbalize feelings will improve Ability to disclose and discuss suicidal ideas Ability to demonstrate self-control will improve Ability to identify and develop effective coping behaviors will improve Ability to maintain clinical measurements within normal limits will improve Ability to identify triggers associated with substance abuse/mental health issues will improve NA  Medication Management: Evaluate patient's response, side effects,  and tolerance of medication regimen.  Therapeutic Interventions: 1 to 1 sessions, Unit Group sessions and Medication administration.  Evaluation of Outcomes: Progressing  Physician Treatment Plan for Secondary Diagnosis: Principal Problem:   Major depressive disorder, recurrent severe without psychotic features  ([place]) Active Problems:   Bladder cancer ([place])   CKD (chronic kidney disease), stage IV ([facility])   GERD (gastroesophageal reflux disease)   Suicidal ideation  Long Term Goal(s): Improvement in symptoms so as ready for discharge NA   Short Term Goals: Ability to identify changes in lifestyle to reduce recurrence of condition will improve Ability to verbalize feelings will improve Ability to disclose and discuss suicidal ideas Ability to demonstrate self-control will improve Ability to identify and develop effective coping behaviors will improve Ability to maintain clinical measurements within normal limits will improve Ability to identify triggers associated with substance abuse/mental health issues will improve NA     Medication Management: Evaluate patient's response, side effects, and tolerance of medication regimen.  Therapeutic Interventions: 1 to 1 sessions, Unit Group sessions and Medication administration.  Evaluation of Outcomes: Progressing   RN Treatment Plan for Primary Diagnosis: Major depressive disorder, recurrent severe without psychotic features ([place]) Long Term Goal(s): Knowledge of disease and therapeutic regimen to maintain health will improve  Short Term Goals: Ability to verbalize feelings will improve, Ability to disclose and discuss suicidal ideas, Ability to identify and develop effective coping behaviors will improve and Compliance with prescribed medications will improve  Medication Management: RN will administer medications as ordered by provider, will assess and evaluate patient's response and provide education to patient for prescribed medication. RN will report any adverse and/or side effects to prescribing provider.  Therapeutic Interventions: 1 on 1 counseling sessions, Psychoeducation, Medication administration, Evaluate responses to treatment, Monitor vital signs and CBGs as ordered, Perform/monitor CIWA, COWS, AIMS and Fall Risk screenings as ordered,  Perform wound care treatments as ordered.  Evaluation of Outcomes: Progressing   LCSW Treatment Plan for Primary Diagnosis: Major depressive disorder, recurrent severe without psychotic features ([place]) Long Term Goal(s): Safe transition to appropriate next level of care at discharge, Engage patient in therapeutic group addressing interpersonal concerns.  Short Term Goals: Engage patient in aftercare planning with referrals and resources, Increase social support and Increase skills for wellness and recovery  Therapeutic Interventions: Assess for all discharge needs, 1 to 1 time with Social worker, Explore available resources and support systems, Assess for adequacy in community support network, Educate family and significant other(s) on suicide prevention, Complete Psychosocial Assessment, Interpersonal group therapy.  Evaluation of Outcomes: Progressing   Progress in Treatment: Attending groups: Yes. Participating in groups: Yes. Taking medication as prescribed: Yes. Toleration medication: Yes. Family/Significant other contact made: Yes, individual(s) contacted:  wife Patient understands diagnosis: Yes. Discussing patient identified problems/goals with staff: Yes. Medical problems stabilized or resolved: Yes. Denies suicidal/homicidal ideation: Yes. Issues/concerns per patient self-inventory: Yes. Other: none  New problem(s) identified: No, Describe:  none  New Short Term/Long Term Goal(s):  Discharge Plan or Barriers: CSW assessing for appropriate plan.  Reason for Continuation of Hospitalization: Depression Medication stabilization  Estimated Length of Stay: 5-7 days.  Attendees: Patient: Pt at dialysis today. 11/17/2016   Physician: Dr. Orson Slick, MD 11/17/2016   Nursing: Polly Cobia, RN 11/17/2016   RN Care Manager: 11/17/2016   Social Worker: Lurline Idol MSW, LCSW 11/17/2016   Recreational Therapist: Leonette Monarch, LRT/CTRS 11/17/2016   Other: Drue Dun, chaplain 11/17/2016   Other:  11/17/2016   Other:  11/17/2016          Scribe for Treatment Team: Joanne Chars, LCSW 11/17/2016 11:59 AM

## 2016-11-18 MED ORDER — QUETIAPINE FUMARATE 25 MG PO TABS: 25.0000 mg | ORAL_TABLET | Freq: Every day | ORAL | Status: DC

## 2016-11-18 NOTE — Progress Notes (Signed)
D: Pt denies SI/HI/AVH, affect is flat and sad but brightens upon approach. Patient's mood  is pleasant and cooperative, thoughts are organized. Pt appears non anxious and he is interacting with peers and staff appropriately.  A: Pt was offered support and encouragement. Pt was given scheduled medications. Pt was encouraged to attend groups. Q 15 minute checks were done for safety.  R:Pt attends groups and interacts well with peers and staff. Pt is taking medication. Pt has no complaints.Pt receptive to treatment and safety maintained on unit.

## 2016-11-18 NOTE — [facility] Group Notes (Signed)
[place] [facility] LCSW Group Therapy Note  Date/Time: 11/18/16, 0930  Type of Therapy/Topic:  Group Therapy:  Balance in Life  Participation Level:  Did not attend  Description of Group:    This group will address the concept of balance and how it feels and looks when one is unbalanced. Patients will be encouraged to process areas in their lives that are out of balance, and identify reasons for remaining unbalanced. Facilitators will guide patients utilizing problem- solving interventions to address and correct the stressor making their life unbalanced. Understanding and applying boundaries will be explored and addressed for obtaining  and maintaining a balanced life. Patients will be encouraged to explore ways to assertively make their unbalanced needs known to significant others in their lives, using other group members and facilitator for support and feedback.  Therapeutic Goals: 1. Patient will identify two or more emotions or situations they have that consume much of in their lives. 2. Patient will identify signs/triggers that life has become out of balance:  3. Patient will identify two ways to set boundaries in order to achieve balance in their lives:  4. Patient will demonstrate ability to communicate their needs through discussion and/or role plays  Summary of Patient Progress:          Therapeutic Modalities:   Cognitive Behavioral Therapy Solution-Focused Therapy Assertiveness Training  Lurline Idol, LCSW

## 2016-11-18 NOTE — [facility] Group Notes (Signed)
[facility] Group Notes:  (Nursing/MHT/Case Management/Adjunct)  Date:  11/18/2016  Time:  4:13 PM  Type of Therapy:  Psychoeducational Skills  Participation Level:  Did Not Attend  Charise Killian 11/18/2016, 4:13 PM

## 2016-11-18 NOTE — Progress Notes (Signed)
Affect flat.  Denies SI/HI/AVH.  When asked about depression patient states "I am fine"    No group attendance.  Visible in milieu. Minimal interaction noted with peers.  Support offered.  Safety rounds maintained.  Scheduled medications given.

## 2016-11-18 NOTE — Progress Notes (Signed)
Recreation Therapy Notes  Date: 08.16.18 Time: 1:00 pm Location: Craft Room  Group Topic: Leisure Education  Goal Area(s) Addresses:  Patient will identify healthy leisure activities. Patient will identify positive emotions.  Behavioral Response: Did not attend  Intervention: Leisure Time  Activity: Patients were given 2 pieces of paper and were instructed to write healthy leisure activities on each piece. Patients were given a Leisure Time Clock worksheet to complete. Patients were instructed as a group to come up with 10 positive emotions. Patients were instructed to match the activities to the emotions they listed.  Education: LRT educated patients on what they need to participate in leisure.  Education Outcome: Patient did not attend group.   Clinical Observations/Feedback: Patient did not attend group.  Leonette Monarch, LRT/CTRS 11/18/2016 2:07 PM

## 2016-11-18 NOTE — Progress Notes (Deleted)
Recreation Therapy Notes  Date: 08.16.18 Time: 9:30 am Location: Craft Room  Group Topic: Leisure Education  Goal Area(s) Addresses:  Patient will identify things they are grateful for. Patient will identify how being grateful can influence decision making.  Behavioral Response: Did not attend  Intervention: Grateful Wheel  Activity: Patients were given an I Am Grateful For worksheet and were instructed to write things they are grateful for under each category.  Education: LRT educated patients on leisure.  Education Outcome: Patient did not attend group.   Clinical Observations/Feedback: Patient did not attend group.  Leonette Monarch, LRT/CTRS 11/18/2016 1:55 PM

## 2016-11-18 NOTE — Progress Notes (Signed)
Recreation Therapy Notes  At approximately 10:57 am, LRT attempted treatment session. Patient reported he was sleepy from the medications and requested for LRT to come back later.  Leonette Monarch, LRT/CTRS 11/18/2016 2:56 PM

## 2016-11-18 NOTE — Progress Notes (Signed)
[facility] MD Progress Note  11/18/2016 8:48 AM [name] [name]  MRN:  [id number]  Subjective:   [name] [name] is a 56 year old male with a history of depression and new onset kidney failure on dialysis admitted for suicidal ideation in the context of severe social stressors improving on Effexor.  11/17/2016. [name] [name] feels slightly better today. He just spoke with his wife and mother. He completed dialysis today and participated in programming in the afternoon. He did not come to treatment team because of dialysis today. There are no somatic complaints or side effects of medications. He did not sleep well last night in spite of additional Trazodone given in the middle of the night that gave him a headache. We will start Seroquel tonight.  11/18/2016. [name] [name] believes that he is on kidney transplant list. I spoke with Dr. Juleen China who explained that it is unlikely yet as he has been on dialysis very briefly. He is very sleepy this morning from Seroquel give last night. We will lowr his dose to 25 mg tomight. Otherwise, there are no new complains. He enjoys program participation. Appetite is fair. No somatic complaints.  Per nursing: D: Pt denies SI/HI/AVH, affect is flat and sad but brightens upon approach. Patient's mood  is pleasant and cooperative, thoughts are organized. Pt appears non anxious and he is interacting with peers and staff appropriately.  A: Pt was offered support and encouragement. Pt was given scheduled medications. Pt was encouraged to attend groups. Q 15 minute checks were done for safety.  R:Pt attends groups and interacts well with peers and staff. Pt is taking medication. Pt has no complaints.Pt receptive to treatment and safety maintained on unit.  Principal Problem: Major depressive disorder, recurrent severe without psychotic features ([place]) Diagnosis:   Patient Active Problem List   Diagnosis Date Noted  . GERD (gastroesophageal reflux disease) [K21.9] 11/16/2016  .  Suicidal ideation [R45.851] 11/16/2016  . Pyelonephritis [N12] 10/20/2015  . Hydronephrosis [N13.30] 10/20/2015  . E-coli UTI [N39.0, B96.20]   . Anemia of chronic disease [D63.8]   . Acute encephalopathy [G93.40] 10/19/2014  . Altered mental status [R41.82] 10/19/2014  . Faintness [R55]   . Syncope [R55] 07/06/2014  . Syncope and collapse [R55] 07/05/2014  . CKD (chronic kidney disease), stage IV ([place]) [N18.4] 07/05/2014  . Acute coccygeal pain [M53.3] 07/05/2014  . Occipital scalp laceration [S01.01XA] 07/05/2014  . Diabetes mellitus type 2, diet-controlled ([place]) [E11.9] 07/05/2014  . Protein calorie malnutrition ([place]) [E46] 11/29/2013  . AKI (acute kidney injury) ([place]) [N17.9] 10/30/2013  . Hypercalcemia [E83.52] 06/26/2013  . UTI (lower urinary tract infection) [N39.0] 06/25/2013  . Bladder cancer ([place]) [C67.9] 05/09/2013  . Major depressive disorder, recurrent severe without psychotic features ([place]) [F33.2] 09/03/2005   Total Time spent with patient: 30 minutes  Past Psychiatric History: depression.  Past Medical History:  Past Medical History:  Diagnosis Date  . Acute renal injury ([place]) 11/2013  . Anemia   . Anxiety   . Bladder cancer ([place]) 2013  . Complication of anesthesia    agitation w/awakening in 9/13,OK 01/24/12  . Depression   . Diabetes mellitus type 2, diet-controlled ([place]) PT STOPPED TAKING METFORMIN--  HAS BEEN WATCHING DIET, EXERCISING AND LOSING WT  . Frequency of urination   . GERD (gastroesophageal reflux disease)   . Gout    recent flair -bilateral feet--  STABLE  . Hypertension   . Kidney failure   . Nocturia   . Urgency incontinence     Past  Surgical History:  Procedure Laterality Date  . AV FISTULA PLACEMENT Left 05/31/2014   Procedure: LEFT RADIOCEPHALIC ARTERIOVENOUS (AV) FISTULA CREATION ;  Surgeon: Mal Misty, MD;  Location: [facility];  Service: Vascular;  Laterality: Left;  . BACK SURGERY    . BLADDER SURGERY  04/26/2013   at the cancer  treatment center of [place] in  [place]    . CYSTOSCOPY W/ RETROGRADES Bilateral 07/10/2012   Procedure: CYSTOSCOPY WITH RETROGRADE PYELOGRAM;  Surgeon: Molli Hazard, MD;  Location: [facility];  Service: Urology;  Laterality: Bilateral;  . CYSTOSCOPY WITH URETHRAL DILATATION  01/24/2012   Procedure: CYSTOSCOPY WITH URETHRAL DILATATION;  Surgeon: Molli Hazard, MD;  Location: [facility];  Service: Urology;  Laterality: N/A;  . IR NEPHROSTOMY PLACEMENT LEFT  07/16/2016  . LUMBAR LAMINECTOMY  06-02-2005   W/ RESECTION NERVE ROOT  L4  -  L5  . NEPHROSTOMY TUBE PLACEMENT ([place] HX)  2015   right   . TRANSURETHRAL RESECTION OF BLADDER TUMOR  12/15/2011   Procedure: TRANSURETHRAL RESECTION OF BLADDER TUMOR (TURBT);  Surgeon: Molli Hazard, MD;  Location[facility];  Service: Urology;  Laterality: N/A;  2 HRS   . TRANSURETHRAL RESECTION OF BLADDER TUMOR  01/24/2012   Procedure: TRANSURETHRAL RESECTION OF BLADDER TUMOR (TURBT);  Surgeon: Molli Hazard, MD;  Location: [facility];  Service: Urology;  Laterality: N/A;  90 MIN   . TRANSURETHRAL RESECTION OF BLADDER TUMOR N/A 07/10/2012   Procedure: TRANSURETHRAL RESECTION OF BLADDER TUMOR (TURBT);  Surgeon: Molli Hazard, MD;  Location: [facility];  Service: Urology;  Laterality: N/A;  . ulner artery repair Right 2009   rt -trauma /thrombectomy,repair   Family History:  Family History  Problem Relation Age of Onset  . Diabetes Mellitus II Other   . Hypertension Other   . Bladder Cancer Neg Hx    Family Psychiatric  History: none reported. Social History:  History  Alcohol Use No     History  Drug Use No    Social History   Social History  . Marital status: Married    Spouse name: N/A  . Number of children: N/A  . Years of education: N/A   Social History Main Topics  . Smoking status: Former Smoker    Packs/day: 1.00     Years: 16.00    Types: Cigarettes    Quit date: 01/20/1992  . Smokeless tobacco: Former Systems developer    Types: [place] date: 01/20/1992  . Alcohol use No  . Drug use: No  . Sexual activity: Not Currently   Other Topics Concern  . None   Social History Narrative  . None   Additional Social History:                         Sleep: Fair  Appetite:  Fair  Current Medications: Current Facility-Administered Medications  Medication Dose Route Frequency Provider Last Rate Last Dose  . acetaminophen (TYLENOL) tablet 650 mg  650 mg Oral Q6H PRN Bayani Renteria B, MD      . alum & mag hydroxide-simeth (MAALOX/MYLANTA) 200-200-20 MG/5ML suspension 30 mL  30 mL Oral Q4H PRN Davielle Lingelbach B, MD      . epoetin alfa (EPOGEN,PROCRIT) injection 4,000 Units  4,000 Units Intravenous Q M,W,F-HD Kolluru, Sarath, MD   4,000 Units at 11/17/16 1050  . magnesium hydroxide (MILK OF MAGNESIA) suspension  30 mL  30 mL Oral Daily PRN Sufyan Meidinger B, MD      . pantoprazole (PROTONIX) EC tablet 40 mg  40 mg Oral BID Kristyn Obyrne B, MD   40 mg at 11/18/16 0845  . QUEtiapine (SEROQUEL) tablet 100 mg  100 mg Oral QHS Andera Cranmer B, MD   100 mg at 11/17/16 2137  . sevelamer carbonate (RENVELA) tablet 800 mg  800 mg Oral TID WC Kishon Garriga B, MD   800 mg at 11/18/16 0845  . temazepam (RESTORIL) capsule 15 mg  15 mg Oral QHS PRN Kevonta Phariss B, MD      . venlafaxine XR (EFFEXOR-XR) 24 hr capsule 150 mg  150 mg Oral Q breakfast Kimyetta Flott B, MD   150 mg at 11/18/16 0845  . vitamin B-12 (CYANOCOBALAMIN) tablet 1,000 mcg  1,000 mcg Oral Daily Saira Kramme B, MD   1,000 mcg at 11/18/16 0845    Lab Results:  Results for orders placed or performed during the hospital encounter of 11/16/16 (from the past 48 hour(s))  CBC     Status: Abnormal   Collection Time: 11/17/16  9:00 AM  Result Value Ref Range   WBC 6.3 3.8 - 10.6 K/uL   RBC 3.47 (L) 4.40 -  5.90 MIL/uL   Hemoglobin 10.7 (L) 13.0 - 18.0 g/dL   HCT 31.3 (L) 40.0 - 52.0 %   MCV 90.2 80.0 - 100.0 fL   MCH 30.9 26.0 - 34.0 pg   MCHC 34.2 32.0 - 36.0 g/dL   RDW 16.4 (H) 11.5 - 14.5 %   Platelets 237 150 - 440 K/uL    Blood Alcohol level:  Lab Results  Component Value Date   ETH <5 32/99/2426    Metabolic Disorder Labs: Lab Results  Component Value Date   HGBA1C 6.1 (H) 10/21/2015   MPG 128 10/21/2015   MPG 111 10/20/2014   No results found for: PROLACTIN Lab Results  Component Value Date   CHOL 146 11/16/2016   TRIG 534 (H) 11/16/2016   HDL 33 (L) 11/16/2016   CHOLHDL 4.4 11/16/2016   VLDL UNABLE TO CALCULATE IF TRIGLYCERIDE OVER 400 mg/dL 11/16/2016   LDLCALC UNABLE TO CALCULATE IF TRIGLYCERIDE OVER 400 mg/dL 11/16/2016   LDLCALC  10/30/2009    71        Total Cholesterol/HDL:CHD Risk Coronary Heart Disease Risk Table                     Men   Women  1/2 Average Risk   3.4   3.3  Average Risk       5.0   4.4  2 X Average Risk   9.6   7.1  3 X Average Risk  23.4   11.0        Use the calculated Patient Ratio above and the CHD Risk Table to determine the patient's CHD Risk.        ATP III CLASSIFICATION (LDL):  <100     mg/dL   Optimal  100-129  mg/dL   Near or Above                    Optimal  130-159  mg/dL   Borderline  160-189  mg/dL   High  >190     mg/dL   Very High    Physical Findings: AIMS:  , ,  ,  ,    CIWA:    COWS:  Musculoskeletal: Strength & Muscle Tone: within normal limits Gait & Station: normal Patient leans: N/A  Psychiatric Specialty Exam: Physical Exam  Nursing note and vitals reviewed. Psychiatric: His speech is normal. His affect is blunt. He is slowed and withdrawn. Cognition and memory are normal. He expresses impulsivity. He exhibits a depressed mood. He expresses suicidal ideation. He expresses suicidal plans.    Review of Systems  Psychiatric/Behavioral: Positive for depression and suicidal ideas. The patient  has insomnia.   All other systems reviewed and are negative.   Blood pressure 96/63, pulse 81, temperature 98 F (36.7 C), resp. rate 15, height 5\' 10"  (1.778 m), weight 85.2 kg (187 lb 13.3 oz), SpO2 100 %.Body mass index is 26.95 kg/m.  General Appearance: Casual  Eye Contact:  Good  Speech:  Clear and Coherent  Volume:  Decreased  Mood:  Depressed and Hopeless  Affect:  Blunt  Thought Process:  Goal Directed and Descriptions of Associations: Intact  Orientation:  Full (Time, Place, and Person)  Thought Content:  WDL  Suicidal Thoughts:  Yes.  with intent/plan  Homicidal Thoughts:  No  Memory:  Immediate;   Fair Recent;   Fair Remote;   Fair  Judgement:  Fair  Insight:  Fair  Psychomotor Activity:  Psychomotor Retardation  Concentration:  Concentration: Fair and Attention Span: Fair  Recall:  [employer] of Knowledge:  Fair  Language:  Fair  Akathisia:  No  Handed:  Right  AIMS (if indicated):     Assets:  Communication Skills Desire for Improvement Financial Resources/Insurance Housing Intimacy Resilience Social Support Transportation Vocational/Educational  ADL's:  Intact  Cognition:  WNL  Sleep:  Number of Hours: 6.75     Treatment Plan Summary: Daily contact with patient to assess and evaluate symptoms and progress in treatment and Medication management   Mr. Demarest is a 56 year old male with a history of depression that the new onset severe medical problems including need for dialysis admitted for suicidal ideation with a plan to overdose.  1. Suicidal ideation. The patient is able to contract for safety in the hospital.  2. Mood. We started Effexor for depression. We added low dose Seroquel for depression, mood stabilization and insomnia.   3. Kidney failure. The patient receives dialysis 3 times a week. Nephrology consult is greatly appreciated. It is unlikely that the patient is on transplant list at this point. The patient is not sure whether or  not he is on a transplant list.  4. Metabolic syndrome monitoring. Lipid panel shows elevated triglycerides, normal TSH and hemoglobin A1c of 6.2.   5. EKG. Normal sinus rhythm, QTc 469.  6. GERD. He is on Protonix.  7. Insomnia. Seroquel is helpful.    8. Low Vit B12. We started supplementation.  9. Disposition. The patient will be discharged to home with family. He will need appointment with a psychiatrist.   Orson Slick, MD 11/18/2016, 8:48 AM

## 2016-11-18 NOTE — Plan of Care (Signed)
Problem: Coping: Goal: Ability to verbalize feelings will improve Outcome: Progressing Patient verbalized feelings to staff.    

## 2016-11-18 NOTE — Plan of Care (Signed)
Problem: Coping: Goal: Ability to verbalize feelings will improve Outcome: Progressing Verbalizes feelings without any difficulty.

## 2016-11-19 DIAGNOSIS — E538 Deficiency of other specified B group vitamins: Secondary | ICD-10-CM | POA: Diagnosis present

## 2016-11-19 LAB — CBC
HCT: 29.2 % — ABNORMAL LOW (ref 40.0–52.0)
Hemoglobin: 10.2 g/dL — ABNORMAL LOW (ref 13.0–18.0)
MCH: 31.1 pg (ref 26.0–34.0)
MCHC: 34.8 g/dL (ref 32.0–36.0)
MCV: 89.4 fL (ref 80.0–100.0)
PLATELETS: 210 10*3/uL (ref 150–440)
RBC: 3.27 MIL/uL — ABNORMAL LOW (ref 4.40–5.90)
RDW: 16.9 % — AB (ref 11.5–14.5)
WBC: 6 10*3/uL (ref 3.8–10.6)

## 2016-11-19 LAB — RENAL FUNCTION PANEL
Albumin: 4.1 g/dL (ref 3.5–5.0)
Anion gap: 10 (ref 5–15)
BUN: 48 mg/dL — ABNORMAL HIGH (ref 6–20)
CO2: 25 mmol/L (ref 22–32)
Calcium: 9 mg/dL (ref 8.9–10.3)
Chloride: 100 mmol/L — ABNORMAL LOW (ref 101–111)
Creatinine, Ser: 3.97 mg/dL — ABNORMAL HIGH (ref 0.61–1.24)
GFR, EST AFRICAN AMERICAN: 18 mL/min — AB (ref >60)
GFR, EST NON AFRICAN AMERICAN: 15 mL/min — AB (ref >60)
Glucose, Bld: 129 mg/dL — ABNORMAL HIGH (ref 65–99)
POTASSIUM: 3.8 mmol/L (ref 3.5–5.1)
Phosphorus: 3.2 mg/dL (ref 2.5–4.6)
Sodium: 135 mmol/L (ref 135–145)

## 2016-11-19 MED ORDER — VENLAFAXINE HCL ER 150 MG PO CP24: 150.0000 mg | ORAL_CAPSULE | Freq: Every day | ORAL | 1 refills | Status: DC

## 2016-11-19 MED ORDER — QUETIAPINE FUMARATE 25 MG PO TABS: 25.0000 mg | ORAL_TABLET | Freq: Every day | ORAL | 1 refills | Status: DC

## 2016-11-19 MED ORDER — CYANOCOBALAMIN 1000 MCG PO TABS: 1000.0000 ug | ORAL_TABLET | Freq: Every day | ORAL | 1 refills | Status: DC

## 2016-11-19 NOTE — Tx Team (Signed)
Interdisciplinary Treatment and Diagnostic Plan Update  11/19/2016 Time of Session: 10:50am [name] [name] MRN: [id number]  Principal Diagnosis: Major depressive disorder, recurrent severe without psychotic features ([place])  Secondary Diagnoses: Principal Problem:   Major depressive disorder, recurrent severe without psychotic features ([place]) Active Problems:   Bladder cancer ([place])   CKD (chronic kidney disease), stage IV (HCC)   GERD (gastroesophageal reflux disease)   Suicidal ideation   Current Medications:  Current Facility-Administered Medications  Medication Dose Route Frequency Provider Last Rate Last Dose  . acetaminophen (TYLENOL) tablet 650 mg  650 mg Oral Q6H PRN Pucilowska, Jolanta B, MD      . alum & mag hydroxide-simeth (MAALOX/MYLANTA) 200-200-20 MG/5ML suspension 30 mL  30 mL Oral Q4H PRN Pucilowska, Jolanta B, MD      . epoetin alfa (EPOGEN,PROCRIT) injection 4,000 Units  4,000 Units Intravenous Q M,W,F-HD Kolluru, Sarath, MD   4,000 Units at 11/17/16 1050  . magnesium hydroxide (MILK OF MAGNESIA) suspension 30 mL  30 mL Oral Daily PRN Pucilowska, Jolanta B, MD      . pantoprazole (PROTONIX) EC tablet 40 mg  40 mg Oral BID Pucilowska, Jolanta B, MD   40 mg at 11/18/16 1721  . QUEtiapine (SEROQUEL) tablet 25 mg  25 mg Oral QHS Pucilowska, Jolanta B, MD      . sevelamer carbonate (RENVELA) tablet 800 mg  800 mg Oral TID WC Pucilowska, Jolanta B, MD   800 mg at 11/18/16 1721  . venlafaxine XR (EFFEXOR-XR) 24 hr capsule 150 mg  150 mg Oral Q breakfast Pucilowska, Jolanta B, MD   150 mg at 11/18/16 0845  . vitamin B-12 (CYANOCOBALAMIN) tablet 1,000 mcg  1,000 mcg Oral Daily Pucilowska, Jolanta B, MD   1,000 mcg at 11/18/16 0845   PTA Medications: Prescriptions Prior to Admission  Medication Sig Dispense Refill Last Dose  . buPROPion (WELLBUTRIN SR) 100 MG 12 hr tablet Take 100 mg by mouth 2 (two) times daily.   11/14/2016 at 1000  . sodium bicarbonate 650 MG tablet Take 2  tablets (1,300 mg total) by mouth 3 (three) times daily. (Patient taking differently: Take 1,950 mg by mouth 2 (two) times daily. ) 90 tablet 0 11/15/2016 at 2300  . citalopram (CELEXA) 40 MG tablet Take 40 mg by mouth every morning.    11/14/2016 at 1000  . pantoprazole (PROTONIX) 40 MG tablet Take 40 mg by mouth 2 (two) times daily.   6 11/14/2016 at 2100  . sevelamer carbonate (RENVELA) 800 MG tablet Take 1 tablet by mouth 3 (three) times daily.  1 Not Taking at Unknown time    Patient Stressors: Financial difficulties Health problems  Patient Strengths: Ability for insight Average or above average intelligence Capable of independent living Motivation for treatment/growth Supportive family/friends  Treatment Modalities: Medication Management, Group therapy, Case management,  1 to 1 session with clinician, Psychoeducation, Recreational therapy.   Physician Treatment Plan for Primary Diagnosis: Major depressive disorder, recurrent severe without psychotic features ([place]) Long Term Goal(s): Improvement in symptoms so as ready for discharge NA   Short Term Goals: Ability to identify changes in lifestyle to reduce recurrence of condition will improve Ability to verbalize feelings will improve Ability to disclose and discuss suicidal ideas Ability to demonstrate self-control will improve Ability to identify and develop effective coping behaviors will improve Ability to maintain clinical measurements within normal limits will improve Ability to identify triggers associated with substance abuse/mental health issues will improve NA  Medication Management: Evaluate patient's  response, side effects, and tolerance of medication regimen.  Therapeutic Interventions: 1 to 1 sessions, Unit Group sessions and Medication administration.  Evaluation of Outcomes: Adequate for Discharge  Physician Treatment Plan for Secondary Diagnosis: Principal Problem:   Major depressive disorder, recurrent  severe without psychotic features ([place]) Active Problems:   Bladder cancer ([place])   CKD (chronic kidney disease), stage IV (HCC)   GERD (gastroesophageal reflux disease)   Suicidal ideation  Long Term Goal(s): Improvement in symptoms so as ready for discharge NA   Short Term Goals: Ability to identify changes in lifestyle to reduce recurrence of condition will improve Ability to verbalize feelings will improve Ability to disclose and discuss suicidal ideas Ability to demonstrate self-control will improve Ability to identify and develop effective coping behaviors will improve Ability to maintain clinical measurements within normal limits will improve Ability to identify triggers associated with substance abuse/mental health issues will improve NA     Medication Management: Evaluate patient's response, side effects, and tolerance of medication regimen.  Therapeutic Interventions: 1 to 1 sessions, Unit Group sessions and Medication administration.  Evaluation of Outcomes: Adequate for Discharge   RN Treatment Plan for Primary Diagnosis: Major depressive disorder, recurrent severe without psychotic features ([place]) Long Term Goal(s): Knowledge of disease and therapeutic regimen to maintain health will improve  Short Term Goals: Ability to verbalize feelings will improve, Ability to disclose and discuss suicidal ideas, Ability to identify and develop effective coping behaviors will improve and Compliance with prescribed medications will improve  Medication Management: RN will administer medications as ordered by provider, will assess and evaluate patient's response and provide education to patient for prescribed medication. RN will report any adverse and/or side effects to prescribing provider.  Therapeutic Interventions: 1 on 1 counseling sessions, Psychoeducation, Medication administration, Evaluate responses to treatment, Monitor vital signs and CBGs as ordered, Perform/monitor CIWA, COWS,  AIMS and Fall Risk screenings as ordered, Perform wound care treatments as ordered.  Evaluation of Outcomes: Adequate for Discharge   LCSW Treatment Plan for Primary Diagnosis: Major depressive disorder, recurrent severe without psychotic features ([place]) Long Term Goal(s): Safe transition to appropriate next level of care at discharge, Engage patient in therapeutic group addressing interpersonal concerns.  Short Term Goals: Engage patient in aftercare planning with referrals and resources, Increase social support and Increase skills for wellness and recovery  Therapeutic Interventions: Assess for all discharge needs, 1 to 1 time with Social worker, Explore available resources and support systems, Assess for adequacy in community support network, Educate family and significant other(s) on suicide prevention, Complete Psychosocial Assessment, Interpersonal group therapy.  Evaluation of Outcomes: Adequate for Discharge   Progress in Treatment: Attending groups: Yes. Participating in groups: Yes. Taking medication as prescribed: Yes. Toleration medication: Yes. Family/Significant other contact made: Yes, individual(s) contacted:  wife Patient understands diagnosis: Yes. Discussing patient identified problems/goals with staff: Yes. Medical problems stabilized or resolved: Yes. Denies suicidal/homicidal ideation: Yes. Issues/concerns per patient self-inventory: No. Other: n/a  New problem(s) identified: None identified at this time.   New Short Term/Long Term Goal(s): None identified at this time.   Discharge Plan or Barriers: Patient will return home and follow-up with outpatient services.   Reason for Continuation of Hospitalization: Depression Medication stabilization  Estimated Length of Stay: Anticipated discharge 11/19/2016  Attendees: Patient: [name] [name] 11/19/2016 10:55 AM  Physician: Dr. Orson Slick, MD 11/19/2016 10:55 AM  Nursing:  11/19/2016 10:55 AM  RN Care  Manager: 11/19/2016 10:55 AM  Social Worker: Lear Ng. Claybon Jabs  MSW, LCSWA 11/19/2016 10:55 AM  Recreational Therapist: Leonette Monarch, LRT/CTRS 11/19/2016 10:55 AM  Other:  11/19/2016 10:55 AM  Other:  11/19/2016 10:55 AM  Other: 11/19/2016 10:55 AM    Scribe for Treatment Team: Jolaine Click, LCSWA 11/19/2016 10:55 AM

## 2016-11-19 NOTE — Progress Notes (Signed)
  [facility] Adult Case Management Discharge Plan :  Will you be returning to the same living situation after discharge:  Yes,  home with wife. At discharge, do you have transportation home?: Yes,  wife Do you have the ability to pay for your medications: Yes,  patient has insurance  Release of information consent forms completed and in the chart;  Patient's signature needed at discharge.  Patient to Follow up at: Follow-up Information    [place]. Go on 11/24/2016.   Specialty:  Family Medicine Why:  Please attend your follow up appointment with Dewayne Shorter on Wednesday, 11/24/16 at 11:00am.  Please bring a copy of your hospital discharge paperwork. Contact information: [address] [place] [address] [phone]        [place]. Go on 01/13/2017.   Specialty:  Behavioral Health Why:  Please attend your medication management appointment with Dr. Sharyon Medicus on Thursday, 01/13/17 at 10:00am.   Contact information: [place] [id number] [place] [phone]          Next level of care provider has access to [place] and Suicide Prevention discussed: Yes,  with patient and wife  Have you used any form of tobacco in the last 30 days? (Cigarettes, Smokeless Tobacco, Cigars, and/or Pipes): No  Has patient been referred to the Quitline?: N/A patient is not a smoker  Patient has been referred for addiction treatment: N/A  Jolaine Click, [place] 11/19/2016, 1:51 PM

## 2016-11-19 NOTE — Progress Notes (Signed)
POST DIALYSIS ASSESSMENT 

## 2016-11-19 NOTE — Progress Notes (Signed)
HD STARTED  

## 2016-11-19 NOTE — Progress Notes (Signed)
HD COMPLETED  

## 2016-11-19 NOTE — Discharge Summary (Signed)
Physician Discharge Summary Note  Patient:  [name] [name] is an 56 y.o., male MRN:  [id number] DOB:  05/07/1960 Patient phone:  [phone] (home)  Patient address:   [place] [address],  Total Time spent with patient: 30 minutes  Date of Admission:  11/16/2016 Date of Discharge: 11/19/2016  Reason for Admission:  Suicidal ideation.  Identifying data. [name] is a 56 year old male with history of depression.  Chief complaint. "My wife brought me here."  History of present illness. Information was obtained from the patient and the chart. The patient was brought to the hospital by his wife for worsening of depression and voicing suicidal ideation with a plan to overdose. The patient has been increasingly depressed for the past week, developed suicidal ideation, but did not act upon it. He has been under considerable stress. He was diagnosed with bladder cancer several years ago but recently developed kidney problems and in May he started hemodialysis. In spite of medical problems the patient has been trying to develop a business but has had multiple setbacks recently. He used to be a Administrator but now receives disability. The patient endorses many symptoms of depression with extremely poor sleep, decreased appetite, anhedonia, feeling of guilt and hopelessness worthlessness, extremely poor energy and concentration, social isolation, crying spells, and now suicidal thinking. He denies any symptoms of psychosis or symptoms suggestive of bipolar mania. He denies any symptoms of anxiety. He does not drink alcohol or use substances.  Past psychiatry history. There is one psychiatric hospitalization in 2007 after an overdose. He has been maintained on Celexa for many years.  Family psychiatric history. Nonreported.  Social history. 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 He lives with his wife and 2 kids. His  wedding anniversary is this coming Sunday. He believes that he is on kidney transplant list but it seems unlikely.   Principal Problem: Major depressive disorder, recurrent severe without psychotic features [facility]) Discharge Diagnoses: Patient Active Problem List   Diagnosis Date Noted  . Vitamin B12 deficiency [E53.8] 11/19/2016  . GERD (gastroesophageal reflux disease) [K21.9] 11/16/2016  . Suicidal ideation [R45.851] 11/16/2016  . Pyelonephritis [N12] 10/20/2015  . Hydronephrosis [N13.30] 10/20/2015  . E-coli UTI [N39.0, B96.20]   . Anemia of chronic disease [D63.8]   . Acute encephalopathy [G93.40] 10/19/2014  . Altered mental status [R41.82] 10/19/2014  . Faintness [R55]   . Syncope [R55] 07/06/2014  . Syncope and collapse [R55] 07/05/2014  . CKD (chronic kidney disease), stage IV ([place]) [N18.4] 07/05/2014  . Acute coccygeal pain [M53.3] 07/05/2014  . Occipital scalp laceration [S01.01XA] 07/05/2014  . Diabetes mellitus type 2, diet-controlled ([place]) [E11.9] 07/05/2014  . Protein calorie malnutrition ([place]) [E46] 11/29/2013  . AKI (acute kidney injury) ([place]) [N17.9] 10/30/2013  . Hypercalcemia [E83.52] 06/26/2013  . UTI (lower urinary tract infection) [N39.0] 06/25/2013  . Bladder cancer ([place]) [C67.9] 05/09/2013  . Major depressive disorder, recurrent severe without psychotic features ([place]) [F33.2] 09/03/2005    Past Medical History:  Past Medical History:  Diagnosis Date  . Acute renal injury ([place]) 11/2013  . Anemia   . Anxiety   . Bladder cancer ([place]) 2013  . Complication of anesthesia    agitation w/awakening in 9/13,OK 01/24/12  . Depression   . Diabetes mellitus type 2, diet-controlled ([place]) PT STOPPED TAKING METFORMIN--  HAS BEEN WATCHING DIET, EXERCISING AND LOSING WT  . Frequency of urination   . GERD (gastroesophageal  reflux disease)   . Gout    recent flair -bilateral feet--  STABLE  . Hypertension   . Kidney failure   . Nocturia   . Urgency incontinence      Past Surgical History:  Procedure Laterality Date  . AV FISTULA PLACEMENT Left 05/31/2014   Procedure: LEFT RADIOCEPHALIC ARTERIOVENOUS (AV) FISTULA CREATION ;  Surgeon: Mal Misty, MD;  Location: [facility];  Service: Vascular;  Laterality: Left;  . BACK SURGERY    . BLADDER SURGERY  04/26/2013   at the cancer treatment center of [place] in  [place]    . CYSTOSCOPY W/ RETROGRADES Bilateral 07/10/2012   Procedure: CYSTOSCOPY WITH RETROGRADE PYELOGRAM;  Surgeon: Molli Hazard, MD;  Location: [facility];  Service: Urology;  Laterality: Bilateral;  . CYSTOSCOPY WITH URETHRAL DILATATION  01/24/2012   Procedure: CYSTOSCOPY WITH URETHRAL DILATATION;  Surgeon: Molli Hazard, MD;  Location: [facility];  Service: Urology;  Laterality: N/A;  . IR NEPHROSTOMY PLACEMENT LEFT  07/16/2016  . LUMBAR LAMINECTOMY  06-02-2005   W/ RESECTION NERVE ROOT  L4  -  L5  . NEPHROSTOMY TUBE PLACEMENT ([place] [facility])  2015   right   . TRANSURETHRAL RESECTION OF BLADDER TUMOR  12/15/2011   Procedure: TRANSURETHRAL RESECTION OF BLADDER TUMOR (TURBT);  Surgeon: Molli Hazard, MD;  Location: [facility];  Service: Urology;  Laterality: N/A;  2 HRS   . TRANSURETHRAL RESECTION OF BLADDER TUMOR  01/24/2012   Procedure: TRANSURETHRAL RESECTION OF BLADDER TUMOR (TURBT);  Surgeon: Molli Hazard, MD;  Location[facility];  Service: Urology;  Laterality: N/A;  90 MIN   . TRANSURETHRAL RESECTION OF BLADDER TUMOR N/A 07/10/2012   Procedure: TRANSURETHRAL RESECTION OF BLADDER TUMOR (TURBT);  Surgeon: Molli Hazard, MD;  Location: [facility];  Service: Urology;  Laterality: N/A;  . ulner artery repair Right 2009   rt -trauma /thrombectomy,repair   Family History:  Family History  Problem Relation Age of Onset  . Diabetes Mellitus II Other   . Hypertension Other   . Bladder Cancer Neg Hx     Social History:  History   Alcohol Use No     History  Drug Use No    Social History   Social History  . Marital status: Married    Spouse name: N/A  . Number of children: N/A  . Years of education: N/A   Social History Main Topics  . Smoking status: Former Smoker    Packs/day: 1.00    Years: 16.00    Types: Cigarettes    Quit date: 01/20/1992  . Smokeless tobacco: Former Systems developer    Types: [place] date: 01/20/1992  . Alcohol use No  . Drug use: No  . Sexual activity: Not Currently   Other Topics Concern  . None   Social History Narrative  . None    Hospital Course:    Mr. Bosher is a 56 year old male with a history of depression and new onset severe medical problems including need for dialysis admitted for suicidal ideation with a plan to overdose.  1. Suicidal ideation. Resolved. The patient is able to contract for safety. He is forward thinking and optimistic about the future. He is a loving husband, father and son.   2. Mood. We started Effexor for depression and Seroquel for depression, mood stabilization and insomnia.   3. Kidney failure. The patient receives dialysis 3 times a week.  Nephrology consult is greatly appreciated.   4. Metabolic syndrome monitoring. Lipid panel shows elevated triglycerides, normal TSH and hemoglobin A1c of 6.2.   5. EKG. Normal sinus rhythm, QTc 469.  6. GERD. He is on Protonix.  7. Insomnia. Seroquel is helpful.    8. Low Vit B12. We started supplementation.  9. Disposition. The patient was discharged to home with family. He will follow up with his nephrologist for medical needs and a new psychiatrist.   Physical Findings: AIMS:  , ,  ,  ,    CIWA:    COWS:     Musculoskeletal: Strength & Muscle Tone: within normal limits Gait & Station: normal Patient leans: N/A  Psychiatric Specialty Exam: Physical Exam  Nursing note and vitals reviewed. Psychiatric: He has a normal mood and affect. His speech is normal. Judgment and thought  content normal. He is withdrawn. Cognition and memory are normal.    Review of Systems  Psychiatric/Behavioral: The patient has insomnia.   All other systems reviewed and are negative.   Blood pressure 111/69, pulse 83, temperature 97.9 F (36.6 C), temperature source Oral, resp. rate 18, height 5\' 10"  (1.778 m), weight 85.2 kg (187 lb 13.3 oz), SpO2 100 %.Body mass index is 26.95 kg/m.  General Appearance: Casual  Eye Contact:  Good  Speech:  Clear and Coherent  Volume:  Normal  Mood:  Euthymic  Affect:  Blunt  Thought Process:  Goal Directed and Descriptions of Associations: Intact  Orientation:  Full (Time, Place, and Person)  Thought Content:  WDL  Suicidal Thoughts:  No  Homicidal Thoughts:  No  Memory:  Immediate;   Fair Recent;   Fair Remote;   Fair  Judgement:  Fair  Insight:  Fair  Psychomotor Activity:  Normal  Concentration:  Concentration: Fair and Attention Span: Fair  Recall:  [employer] of Knowledge:  Fair  Language:  Fair  Akathisia:  No  Handed:  Left  AIMS (if indicated):     Assets:  Communication Skills Desire for Improvement Financial Resources/Insurance Housing Intimacy Resilience Social Support Transportation  ADL's:  Intact  Cognition:  WNL  Sleep:  Number of Hours: 8.15     Have you used any form of tobacco in the last 30 days? (Cigarettes, Smokeless Tobacco, Cigars, and/or Pipes): No  Has this patient used any form of tobacco in the last 30 days? (Cigarettes, Smokeless Tobacco, Cigars, and/or Pipes) Yes, No  Blood Alcohol level:  Lab Results  Component Value Date   ETH <5 78/46/9629    Metabolic Disorder Labs:  Lab Results  Component Value Date   HGBA1C 6.1 (H) 10/21/2015   MPG 128 10/21/2015   MPG 111 10/20/2014   No results found for: PROLACTIN Lab Results  Component Value Date   CHOL 146 11/16/2016   TRIG 534 (H) 11/16/2016   HDL 33 (L) 11/16/2016   CHOLHDL 4.4 11/16/2016   VLDL UNABLE TO CALCULATE IF TRIGLYCERIDE OVER  400 mg/dL 11/16/2016   LDLCALC UNABLE TO CALCULATE IF TRIGLYCERIDE OVER 400 mg/dL 11/16/2016   LDLCALC  10/30/2009    71        Total Cholesterol/HDL:CHD Risk Coronary Heart Disease Risk Table                     Men   Women  1/2 Average Risk   3.4   3.3  Average Risk       5.0   4.4  2 X Average Risk  9.6   7.1  3 X Average Risk  23.4   11.0        Use the calculated Patient Ratio above and the CHD Risk Table to determine the patient's CHD Risk.        ATP III CLASSIFICATION (LDL):  <100     mg/dL   Optimal  100-129  mg/dL   Near or Above                    Optimal  130-159  mg/dL   Borderline  160-189  mg/dL   High  >190     mg/dL   Very High    See Psychiatric Specialty Exam and Suicide Risk Assessment completed by Attending Physician prior to discharge.  Discharge destination:  Home  Is patient on multiple antipsychotic therapies at discharge:  No   Has Patient had three or more failed trials of antipsychotic monotherapy by history:  No  Recommended Plan for Multiple Antipsychotic Therapies: NA  Discharge Instructions    Diet - low sodium heart healthy    Complete by:  As directed    Increase activity slowly    Complete by:  As directed      Allergies as of 11/19/2016      Reactions   Lisinopril Cough      Medication List    STOP taking these medications   buPROPion 100 MG 12 hr tablet Commonly known as:  WELLBUTRIN SR   citalopram 40 MG tablet Commonly known as:  CELEXA     TAKE these medications     Indication  cyanocobalamin 1000 MCG tablet Take 1 tablet (1,000 mcg total) by mouth daily.  Indication:  Inadequate Vitamin B12   pantoprazole 40 MG tablet Commonly known as:  PROTONIX Take 40 mg by mouth 2 (two) times daily.  Indication:  Gastroesophageal Reflux Disease   QUEtiapine 25 MG tablet Commonly known as:  SEROQUEL Take 1 tablet (25 mg total) by mouth at bedtime.  Indication:  Depressive Phase of Manic-Depression   sevelamer  carbonate 800 MG tablet Commonly known as:  RENVELA Take 1 tablet by mouth 3 (three) times daily.  Indication:  High Amount of Phosphate in the Blood   sodium bicarbonate 650 MG tablet Take 2 tablets (1,300 mg total) by mouth 3 (three) times daily. What changed:  how much to take  when to take this  Indication:  Heartburn   venlafaxine XR 150 MG 24 hr capsule Commonly known as:  EFFEXOR-XR Take 1 capsule (150 mg total) by mouth daily with breakfast.  Indication:  Major Depressive Disorder        Follow-up recommendations:  Activity:  as tolerated. Diet:  renal. Other:  keep follow up appointments.  Comments:    Signed: Orson Slick, MD 11/19/2016, 11:24 AM

## 2016-11-19 NOTE — Plan of Care (Signed)
Problem: Coping: Goal: Ability to verbalize feelings will improve Outcome: Progressing Patient verbalized feelings to staff.    

## 2016-11-19 NOTE — Progress Notes (Signed)
Denies SI/HI/AVH.  Affect said.  Dialyzed today with no untoward affects.  Discharge instructions given, verbalized understanding.  Prescriptions given and all personal belongings returned.  Escorted off unit by this Probation officer to meet wife to travel home.

## 2016-11-19 NOTE — [facility] Group Notes (Signed)
[facility] Group Notes:  (Nursing/MHT/Case Management/Adjunct)  Date:  11/19/2016  Time:  5:30 AM  Type of Therapy:  Psychoeducational Skills  Participation Level:  Did Not Attend  Summary of Progress/Problems:  Reece Agar 11/19/2016, 5:30 AM

## 2016-11-19 NOTE — Progress Notes (Signed)
Recreation Therapy Notes  Date: 08.17.18 Time: 1:00 pm Location: Craft Room  Group Topic: Social Skills  Goal Area(s) Addresses:  Patient will effectively work with peer towards shared goal. Patient will identify skills used to make activity successful. Patient will identify how skills used during activity can be used to reach post d/c goals.  Behavioral Response: Attentive, Interactive  Intervention: Life Boat  Activity: Patients were given a scenario that we were in [place] on a boat exploring. Patients were given a list on 16 people (Jerline Pain, bus driver, Psychologist, forensic, etc.) While we were exploring, the boat sprung a leak and was going to sink. There were two life boats. One is bigger and faster. It holds everyone in the room plus 8 people on the list and other is a raft that holds the remaining 8 people on the list. Patients were instructed as a group to decide who was going on which boat.  Education: LRT educated patients on healthy support systems.  Education Outcome: Acknowledges education/In group clarification offered   Clinical Observations/Feedback: Patient worked with peers towards goal. Patient used effective communication, problem solving, and Scientist, clinical (histocompatibility and immunogenetics). Patient contributed to group discussion by stating what skills he used, why they picked the people they did, and what prevents him from using these skills.  Leonette Monarch, LRT/CTRS 11/19/2016 1:42 PM

## 2016-11-19 NOTE — BHH Suicide Risk Assessment (Signed)
[facility] Discharge Suicide Risk Assessment   Principal Problem: Major depressive disorder, recurrent severe without psychotic features [facility]) Discharge Diagnoses:  Patient Active Problem List   Diagnosis Date Noted  . GERD (gastroesophageal reflux disease) [K21.9] 11/16/2016  . Suicidal ideation [R45.851] 11/16/2016  . Pyelonephritis [N12] 10/20/2015  . Hydronephrosis [N13.30] 10/20/2015  . E-coli UTI [N39.0, B96.20]   . Anemia of chronic disease [D63.8]   . Acute encephalopathy [G93.40] 10/19/2014  . Altered mental status [R41.82] 10/19/2014  . Faintness [R55]   . Syncope [R55] 07/06/2014  . Syncope and collapse [R55] 07/05/2014  . CKD (chronic kidney disease), stage IV ([place]) [N18.4] 07/05/2014  . Acute coccygeal pain [M53.3] 07/05/2014  . Occipital scalp laceration [S01.01XA] 07/05/2014  . Diabetes mellitus type 2, diet-controlled ([place]) [E11.9] 07/05/2014  . Protein calorie malnutrition ([place]) [E46] 11/29/2013  . AKI (acute kidney injury) ([place]) [N17.9] 10/30/2013  . Hypercalcemia [E83.52] 06/26/2013  . UTI (lower urinary tract infection) [N39.0] 06/25/2013  . Bladder cancer ([place]) [C67.9] 05/09/2013  . Major depressive disorder, recurrent severe without psychotic features ([place]) [F33.2] 09/03/2005    Total Time spent with patient: 30 minutes  Musculoskeletal: Strength & Muscle Tone: within normal limits Gait & Station: normal Patient leans: N/A  Psychiatric Specialty Exam: Review of Systems  Psychiatric/Behavioral: The patient has insomnia.   All other systems reviewed and are negative.   Blood pressure 111/69, pulse 83, temperature 97.9 F (36.6 C), temperature source Oral, resp. rate 18, height 5\' 10"  (1.778 m), weight 85.2 kg (187 lb 13.3 oz), SpO2 100 %.Body mass index is 26.95 kg/m.  General Appearance: Casual  Eye Contact::  Good  Speech:  Clear and Coherent409  Volume:  Normal  Mood:  Euthymic  Affect:  Blunt  Thought Process:  Goal Directed and Descriptions of  Associations: Intact  Orientation:  Full (Time, Place, and Person)  Thought Content:  WDL  Suicidal Thoughts:  No  Homicidal Thoughts:  No  Memory:  Immediate;   Fair Recent;   Fair Remote;   Fair  Judgement:  Fair  Insight:  Fair  Psychomotor Activity:  Normal  Concentration:  Fair  Recall:  [employer] [place]  Language: Fair  Akathisia:  No  Handed:  Left  AIMS (if indicated):     Assets:  Communication Skills Desire for Improvement Financial Resources/Insurance Housing Intimacy Resilience Social Support Transportation  Sleep:  Number of Hours: 8.15  Cognition: WNL  ADL's:  Intact   Mental Status Per Nursing Assessment::   On Admission:  Suicidal ideation indicated by patient  Demographic Factors:  Male and Caucasian  Loss Factors: Decrease in vocational status, Decline in physical health and Financial problems/change in socioeconomic status  Historical Factors: NA  Risk Reduction Factors:   Responsible for children under 71 years of age, Sense of responsibility to family, Living with another person, especially a relative and Positive social support  Continued Clinical Symptoms:  Depression:   Severe Medical Diagnoses and Treatments/Surgeries  Cognitive Features That Contribute To Risk:  None    Suicide Risk:  Minimal: No identifiable suicidal ideation.  Patients presenting with no risk factors but with morbid ruminations; may be classified as minimal risk based on the severity of the depressive symptoms    Plan Of Care/Follow-up recommendations:  Activity:  as tolerated. Diet:  renal diet. Other:  keep follow up appointments.  Orson Slick, MD 11/19/2016, 11:20 AM

## 2016-11-19 NOTE — BHH Group Notes (Signed)
[place] LCSW Group Therapy Note  Date/Time: 11/19/16, 0930  Type of Therapy and Topic:  Group Therapy:  Feelings around Relapse and Recovery  Participation Level:  Minimal   Mood: withdrawn  Description of Group:    Patients in this group will discuss emotions they experience before and after a relapse. They will process how experiencing these feelings, or avoidance of experiencing them, relates to having a relapse. Facilitator will guide patients to explore emotions they have related to recovery. Patients will be encouraged to process which emotions are more powerful. They will be guided to discuss the emotional reaction significant others in their lives may have to patients' relapse or recovery. Patients will be assisted in exploring ways to respond to the emotions of others without this contributing to a relapse.  Therapeutic Goals: 1. Patient will identify two or more emotions that lead to relapse for them:  2. Patient will identify two emotions that result when they relapse:  3. Patient will identify two emotions related to recovery:  4. Patient will demonstrate ability to communicate their needs through discussion and/or role plays.   Summary of Patient Progress: Pt did not interact much during group.  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  Pt denies substance use history.  Pt did respond to CSW questions during group but was mostly quiet today.     Therapeutic Modalities:   Cognitive Behavioral Therapy Solution-Focused Therapy Assertiveness Training Relapse Prevention Therapy  Lurline Idol, LCSW

## 2016-11-19 NOTE — Plan of Care (Signed)
Problem: Medication: Goal: Compliance with prescribed medication regimen will improve Outcome: Progressing Medication compliant   

## 2016-11-19 NOTE — Progress Notes (Signed)
D: Pt denies SI/HI/AVH, affect is flat and sad, patient isolated to the room during the evening shift. Patient's mood is pleasant, he appears less anxious and  He rated depression a 5/10 on a scale of ( 0-10)  A: Pt was offered support and encouragement. Pt was offered scheduled medications. Pt was encouraged to attend groups. Q 15 minute checks were done for safety.  R:Pt did not attend evening group. Pt refused sleeping medication. Pt is receptive to treatment and safety maintained on unit.

## 2016-11-19 NOTE — Progress Notes (Signed)
PRE DIALYSIS ASSESSMENT 

## 2016-11-19 NOTE — Progress Notes (Signed)
[facility]  ROUNDING NOTE   Subjective:   Seen and examined on hemodialysis. UF of 0.5 liter.   Objective:  Vital signs in last 24 hours:  Temp:  [97.8 F (36.6 C)-97.9 F (36.6 C)] 97.8 F (36.6 C) (08/17 1410) Pulse Rate:  [73-83] 73 (08/17 1410) Resp:  [18] 18 (08/17 1410) BP: (111-132)/(65-69) 132/65 (08/17 1410) SpO2:  [100 %] 100 % (08/17 1410)  Weight change:  Filed Weights   11/16/16 0224 11/17/16 0925 11/17/16 1256  Weight: 83.9 kg (185 lb) 83.9 kg (184 lb 15.5 oz) 85.2 kg (187 lb 13.3 oz)    Intake/Output: I/O last 3 completed shifts: In: 840 [P.O.:840] Out: -    Intake/Output this shift:  Total I/O In: 360 [P.O.:360] Out: -   Physical Exam: General: NAD,   Head: Normocephalic, atraumatic. Moist oral mucosal membranes  Eyes: Anicteric, PERRL  Neck: Supple, trachea midline  Lungs:  Clear to auscultation  Heart: Regular rate and rhythm  Abdomen:  Soft, nontender,   Extremities: no peripheral edema.  Neurologic: Nonfocal, moving all four extremities  Skin: No lesions  Access: Left forearm AVF    Basic Metabolic Panel:  Recent Labs Lab 11/15/16 1254 11/15/16 1930  NA 140  139 139  K 3.2*  3.2* 3.2*  CL 103  103 105  CO2 24  26 24   GLUCOSE 109*  115* 93  BUN 40*  40* 42*  CREATININE 5.72*  5.88* 5.74*  CALCIUM 9.4  9.4 9.2  PHOS 3.4 4.0    Liver Function Tests:  Recent Labs Lab 11/15/16 1254 11/15/16 1930  AST 19  --   ALT 36  --   ALKPHOS 85  --   BILITOT 0.6  --   PROT 6.8  --   ALBUMIN 4.0  4.0 3.9   No results for input(s): LIPASE, AMYLASE in the last 168 hours. No results for input(s): AMMONIA in the last 168 hours.  CBC:  Recent Labs Lab 11/15/16 1254 11/15/16 1930 11/17/16 0900  WBC 7.6 7.6 6.3  HGB 10.2* 10.1* 10.7*  HCT 30.5* 29.6* 31.3*  MCV 88.2 87.6 90.2  PLT 256 246 237    Cardiac Enzymes: No results for input(s): CKTOTAL, CKMB, CKMBINDEX, TROPONINI in the last 168  hours.  BNP: Invalid input(s): POCBNP  CBG: No results for input(s): GLUCAP in the last 168 hours.  Microbiology: Results for orders placed or performed during the hospital encounter of 10/20/15  Urine culture     Status: Abnormal   Collection Time: 10/20/15  4:49 AM  Result Value Ref Range Status   Specimen Description URINE, CLEAN CATCH  Final   Special Requests NONE  Final   Culture MULTIPLE SPECIES PRESENT, SUGGEST RECOLLECTION (A)  Final   Report Status 10/21/2015 FINAL  Final  Blood culture (routine x 2)     Status: None   Collection Time: 10/20/15  8:34 AM  Result Value Ref Range Status   Specimen Description BLOOD RIGHT HAND  Final   Special Requests BOTTLES DRAWN AEROBIC AND ANAEROBIC 5ML  Final   Culture   Final    NO GROWTH 5 DAYS Performed at [facility]    Report Status 10/25/2015 FINAL  Final  Blood culture (routine x 2)     Status: None   Collection Time: 10/20/15  8:34 AM  Result Value Ref Range Status   Specimen Description BLOOD RIGHT HAND  Final   Special Requests BOTTLES DRAWN AEROBIC AND ANAEROBIC 5ML  Final  Culture   Final    NO GROWTH 5 DAYS Performed at [facility]    Report Status 10/25/2015 FINAL  Final    Coagulation Studies: No results for input(s): LABPROT, INR in the last 72 hours.  Urinalysis: No results for input(s): COLORURINE, LABSPEC, PHURINE, GLUCOSEU, HGBUR, BILIRUBINUR, KETONESUR, PROTEINUR, UROBILINOGEN, NITRITE, LEUKOCYTESUR in the last 72 hours.  Invalid input(s): APPERANCEUR    Imaging: No results found.   Medications:    . epoetin (EPOGEN/PROCRIT) injection  4,000 Units Intravenous Q M,W,F-HD  . pantoprazole  40 mg Oral BID  . QUEtiapine  25 mg Oral QHS  . sevelamer carbonate  800 mg Oral TID WC  . venlafaxine XR  150 mg Oral Q breakfast  . vitamin B-12  1,000 mcg Oral Daily   acetaminophen, alum & mag hydroxide-simeth, magnesium hydroxide  Assessment/ Plan:  [name] is a 56  y.o. white male with end stage renal disease on hemodialysis MWF [place], hypertension, bladder cancer, left nephrectomy, depression, GERD, gout, anemia who was admitted to [facility] on 11/16/2016 for Depression   MWF [facility] AVF  1. End Stage Renal Disease: seen and examined on hemodialysis. Tolerating hemodialysis treatment well.   2. Hypertension:   Blood pressure at goal. Not currently on any medications.   3. Anemia of chronic kidney disease: hemoglobin 10.7 - Mircera as outpatient.  - EPO with HD treatment  4. Secondary Hyperparathyroidism: phosphorus at goal.  - Continue sevelamer   LOS: [place], [name] [name] 8/17/20182:19 PM

## 2016-11-25 ENCOUNTER — Ambulatory Visit (HOSPITAL_COMMUNITY)
Admission: RE | Admit: 2016-11-25 | Discharge: 2016-11-25 | Disposition: A | Discharge status: Home / Self Care | Payer: BC Managed Care – PPO | Attending: Psychiatry | Admitting: Psychiatry

## 2016-11-25 ENCOUNTER — Encounter (HOSPITAL_COMMUNITY): Payer: Self-pay | Admitting: Emergency Medicine

## 2016-11-25 ENCOUNTER — Telehealth (HOSPITAL_COMMUNITY): Payer: Self-pay | Admitting: Licensed Clinical Social Worker

## 2016-11-25 ENCOUNTER — Other Ambulatory Visit (HOSPITAL_COMMUNITY): Payer: BC Managed Care – PPO | Attending: Psychiatry

## 2016-11-25 ENCOUNTER — Non-Acute Institutional Stay (HOSPITAL_COMMUNITY)
Admission: EM | Admit: 2016-11-25 | Discharge: 2016-11-26 | Disposition: A | Discharge status: Home / Self Care | Payer: BC Managed Care – PPO | Attending: Emergency Medicine | Admitting: Emergency Medicine

## 2016-11-25 DIAGNOSIS — Z1389 Encounter for screening for other disorder: Secondary | ICD-10-CM | POA: Diagnosis present

## 2016-11-25 DIAGNOSIS — Z992 Dependence on renal dialysis: Secondary | ICD-10-CM | POA: Diagnosis not present

## 2016-11-25 DIAGNOSIS — I120 Hypertensive chronic kidney disease with stage 5 chronic kidney disease or end stage renal disease: Secondary | ICD-10-CM | POA: Insufficient documentation

## 2016-11-25 DIAGNOSIS — F332 Major depressive disorder, recurrent severe without psychotic features: Secondary | ICD-10-CM | POA: Diagnosis present

## 2016-11-25 DIAGNOSIS — Z79899 Other long term (current) drug therapy: Secondary | ICD-10-CM | POA: Diagnosis not present

## 2016-11-25 DIAGNOSIS — E1122 Type 2 diabetes mellitus with diabetic chronic kidney disease: Secondary | ICD-10-CM | POA: Diagnosis not present

## 2016-11-25 DIAGNOSIS — E538 Deficiency of other specified B group vitamins: Secondary | ICD-10-CM | POA: Diagnosis not present

## 2016-11-25 DIAGNOSIS — N186 End stage renal disease: Secondary | ICD-10-CM

## 2016-11-25 DIAGNOSIS — K219 Gastro-esophageal reflux disease without esophagitis: Secondary | ICD-10-CM | POA: Insufficient documentation

## 2016-11-25 DIAGNOSIS — F32A Depression, unspecified: Secondary | ICD-10-CM

## 2016-11-25 DIAGNOSIS — F419 Anxiety disorder, unspecified: Secondary | ICD-10-CM | POA: Insufficient documentation

## 2016-11-25 DIAGNOSIS — R45851 Suicidal ideations: Secondary | ICD-10-CM | POA: Insufficient documentation

## 2016-11-25 DIAGNOSIS — Z8551 Personal history of malignant neoplasm of bladder: Secondary | ICD-10-CM | POA: Diagnosis not present

## 2016-11-25 DIAGNOSIS — F329 Major depressive disorder, single episode, unspecified: Secondary | ICD-10-CM

## 2016-11-25 LAB — COMPREHENSIVE METABOLIC PANEL
ALBUMIN: 4.5 g/dL (ref 3.5–5.0)
ALT: 31 U/L (ref 17–63)
AST: 22 U/L (ref 15–41)
Alkaline Phosphatase: 96 U/L (ref 38–126)
Anion gap: 11 (ref 5–15)
BILIRUBIN TOTAL: 0.5 mg/dL (ref 0.3–1.2)
BUN: 22 mg/dL — AB (ref 6–20)
CO2: 30 mmol/L (ref 22–32)
CREATININE: 4.43 mg/dL — AB (ref 0.61–1.24)
Calcium: 10 mg/dL (ref 8.9–10.3)
Chloride: 97 mmol/L — ABNORMAL LOW (ref 101–111)
GFR calc Af Amer: 16 mL/min — ABNORMAL LOW (ref >60)
GFR, EST NON AFRICAN AMERICAN: 13 mL/min — AB (ref >60)
GLUCOSE: 83 mg/dL (ref 65–99)
POTASSIUM: 2.9 mmol/L — AB (ref 3.5–5.1)
Sodium: 138 mmol/L (ref 135–145)
TOTAL PROTEIN: 7.6 g/dL (ref 6.5–8.1)

## 2016-11-25 LAB — ACETAMINOPHEN LEVEL: Acetaminophen (Tylenol), Serum: <10 ug/mL — ABNORMAL LOW (ref 10–30)

## 2016-11-25 LAB — CBC
HEMATOCRIT: 36 % — AB (ref 39.0–52.0)
Hemoglobin: 12.1 g/dL — ABNORMAL LOW (ref 13.0–17.0)
MCH: 30.3 pg (ref 26.0–34.0)
MCHC: 33.6 g/dL (ref 30.0–36.0)
MCV: 90 fL (ref 78.0–100.0)
Platelets: 220 10*3/uL (ref 150–400)
RBC: 4 MIL/uL — AB (ref 4.22–5.81)
RDW: 15.7 % — ABNORMAL HIGH (ref 11.5–15.5)
WBC: 8.3 10*3/uL (ref 4.0–10.5)

## 2016-11-25 LAB — RAPID URINE DRUG SCREEN, HOSP PERFORMED
AMPHETAMINES: NOT DETECTED
BARBITURATES: NOT DETECTED
BENZODIAZEPINES: NOT DETECTED
Cocaine: NOT DETECTED
Opiates: NOT DETECTED
TETRAHYDROCANNABINOL: NOT DETECTED

## 2016-11-25 LAB — SALICYLATE LEVEL: Salicylate Lvl: <7 mg/dL (ref 2.8–30.0)

## 2016-11-25 LAB — ETHANOL

## 2016-11-25 MED ORDER — QUETIAPINE FUMARATE 25 MG PO TABS
25.0000 mg | ORAL_TABLET | Freq: Every day | ORAL | Status: DC
  Administered 2016-11-25: 25 mg via ORAL
  Filled 2016-11-25: qty 1

## 2016-11-25 MED ORDER — POTASSIUM CHLORIDE CRYS ER 20 MEQ PO TBCR
40.0000 meq | EXTENDED_RELEASE_TABLET | Freq: Two times a day (BID) | ORAL | Status: DC
  Administered 2016-11-25 – 2016-11-26 (×2): 40 meq via ORAL
  Filled 2016-11-25 (×2): qty 2

## 2016-11-25 NOTE — ED Notes (Signed)
Dinner tray at the bedside

## 2016-11-25 NOTE — ED Notes (Signed)
Offered pt food/drink while we wait on his meal tray. Pt declined. Pt does not need anything at this time.

## 2016-11-25 NOTE — ED Triage Notes (Signed)
Patient arrived at behavioral health hospital to be evaluated for depression. Sent to ED with sitter to be medical cleared and then return back to behavioral health.  Patient denies SI or HI. States did try to hurt himself with superficial cuts left wrist.  Patient has dialysis Mon/Weds/Fri. Last dialysis session yesterday. Patient calm cooperative alert answering and following commands appropriate.

## 2016-11-25 NOTE — BH Assessment (Signed)
Tele Assessment Note     [name] [name] is an 56 y.o. male presenting to [facility] with his wife for an evaluation. [name] [name] is currently on dialysis three days a week, has multiple stressors and increased depression. Recently was admitted to [facility] due to [facility] with plan. One previous SI attempt in the past and hospitalization in 2007. On Tuesday of this week the patient took knives and went up to an attic area, was found with cuts on his left wrist. The patient eventually locked his wife out of the room and would not come out. Wife reports he finally opened the door and appeared to calm down. When this clinician asked about the events on Tuesday the patient appeared to minimize or redirect the conversation. The patient attended dialysis on Wednesday where a SW noticed cuts on his arm where dialysis would be administered. Reported this concern which was communicated to [facility] PHP program director, Dr. Lovena Le, who requested patient be considered for inpatient admission. Denies HI and A/V. Denies drug use.  The patient lives with his wife and children. 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 Reports increased stress around financial issues. Patient has unremarkable appearance, fair eye contact, logical speech, depressed mood and affect, anxiety, coherent thought, impaired judgment, and insight, poor impulse control.   [name] Boers, NP and Dr. Dwyane Dee recommend inpatient admission. Patient agrees to be transferred to [facility]. Will be seen there in consult tomorrow for re-evaluation.   Diagnosis:MDD, recurrent severe, without psychosis  Past Medical History:  Past Medical History:  Diagnosis Date  . Acute renal injury ([place]) 11/2013  . Anemia   . Anxiety   . Bladder cancer ([place]) 2013  . Complication of anesthesia    agitation w/awakening in 9/13,OK 01/24/12  . Depression   . Diabetes mellitus type 2, diet-controlled ([place]) PT STOPPED TAKING METFORMIN--  HAS BEEN WATCHING DIET,  EXERCISING AND LOSING WT  . Frequency of urination   . GERD (gastroesophageal reflux disease)   . Gout    recent flair -bilateral feet--  STABLE  . Hypertension   . Kidney failure   . Nocturia   . Urgency incontinence     Past Surgical History:  Procedure Laterality Date  . AV FISTULA PLACEMENT Left 05/31/2014   Procedure: LEFT RADIOCEPHALIC ARTERIOVENOUS (AV) FISTULA CREATION ;  Surgeon: Mal Misty, MD;  Location: [facility];  Service: Vascular;  Laterality: Left;  . BACK SURGERY    . BLADDER SURGERY  04/26/2013   at the cancer treatment center of [place] in  [place]    . CYSTOSCOPY W/ RETROGRADES Bilateral 07/10/2012   Procedure: CYSTOSCOPY WITH RETROGRADE PYELOGRAM;  Surgeon: Molli Hazard, MD;  Location: [facility];  Service: Urology;  Laterality: Bilateral;  . CYSTOSCOPY WITH URETHRAL DILATATION  01/24/2012   Procedure: CYSTOSCOPY WITH URETHRAL DILATATION;  Surgeon: Molli Hazard, MD;  Location: [facility];  Service: Urology;  Laterality: N/A;  . IR NEPHROSTOMY PLACEMENT LEFT  07/16/2016  . LUMBAR LAMINECTOMY  06-02-2005   W/ RESECTION NERVE ROOT  L4  -  L5  . NEPHROSTOMY TUBE PLACEMENT ([place] [facility])  2015   right   . TRANSURETHRAL RESECTION OF BLADDER TUMOR  12/15/2011   Procedure: TRANSURETHRAL RESECTION OF BLADDER TUMOR (TURBT);  Surgeon: Molli Hazard, MD;  Location: [facility];  Service: Urology;  Laterality: N/A;  2 HRS   . TRANSURETHRAL RESECTION OF BLADDER TUMOR  01/24/2012   Procedure: TRANSURETHRAL  RESECTION OF BLADDER TUMOR (TURBT);  Surgeon: Molli Hazard, MD;  Location: [facility];  Service: Urology;  Laterality: N/A;  90 MIN   . TRANSURETHRAL RESECTION OF BLADDER TUMOR N/A 07/10/2012   Procedure: TRANSURETHRAL RESECTION OF BLADDER TUMOR (TURBT);  Surgeon: Molli Hazard, MD;  Location: [facility];  Service: Urology;  Laterality: N/A;  . ulner artery repair  Right 2009   rt -trauma /thrombectomy,repair    Family History:  Family History  Problem Relation Age of Onset  . Diabetes Mellitus II Other   . Hypertension Other   . Bladder Cancer Neg Hx     Social History:  reports that he quit smoking about 24 years ago. His smoking use included Cigarettes. He has a 16.00 pack-year smoking history. He quit smokeless tobacco use about 24 years ago. His smokeless tobacco use included Chew. He reports that he does not drink alcohol or use drugs.  Additional Social History:  Alcohol / Drug Use Pain Medications: see MAR Prescriptions: see MAR Over the Counter: see MAR History of alcohol / drug use?: No history of alcohol / drug abuse  CIWA:   COWS:    PATIENT STRENGTHS: (choose at least two) Average or above average intelligence General fund of knowledge  Allergies:  Allergies  Allergen Reactions  . Lisinopril Cough    Home Medications:  (Not in a hospital admission)  OB/GYN Status:  No LMP for male patient.  General Assessment Data Location of Assessment: [facility] Assessment Services TTS Assessment: In system Is this a Tele or Face-to-Face Assessment?: Face-to-Face Is this an Initial Assessment or a Re-assessment for this encounter?: Initial Assessment Marital status: Married Is patient pregnant?: No Pregnancy Status: No Living Arrangements: Spouse/significant other Can pt return to current living arrangement?: Yes Referral Source: Self/Family/Friend Insurance type: Insurance risk surveyor Exam (Guys) Medical Exam completed: Yes  Crisis Care Plan Living Arrangements: Spouse/significant other Name of Psychiatrist: n/a Name of Therapist: n/a  Education Status Is patient currently in school?: No Highest grade of school patient has completed: Associates degree  Risk to self with the past 6 months Suicidal Ideation: No Has patient been a risk to self within the past 6 months prior to admission? : Yes Suicidal  Intent: No Has patient had any suicidal intent within the past 6 months prior to admission? : Yes Is patient at risk for suicide?: Yes Suicidal Plan?: No Has patient had any suicidal plan within the past 6 months prior to admission? : Yes Specify Current Suicidal Plan: plan to OD, cut self on Tuesday with a knife Access to Means: Yes Specify Access to Suicidal Means: had daughters knives on Tuesday What has been your use of drugs/alcohol within the last 12 months?: denies Previous Attempts/Gestures: Yes How many times?: 2007 Triggers for Past Attempts: Unknown Intentional Self Injurious Behavior: Cutting Comment - Self Injurious Behavior: on tuesday Family Suicide History: Unknown Recent stressful life event(s): Financial Problems Persecutory voices/beliefs?: No Depression: Yes Depression Symptoms: Isolating, Feeling worthless/self pity Substance abuse history and/or treatment for substance abuse?: No Suicide prevention information given to non-admitted patients: Not applicable  Risk to Others within the past 6 months Homicidal Ideation: No Does patient have any lifetime risk of violence toward others beyond the six months prior to admission? : No Thoughts of Harm to Others: No Current Homicidal Intent: No Current Homicidal Plan: No Access to Homicidal Means: No Identified Victim: n/a History of harm to others?: Yes (assault charges) Assessment of  Violence: None Noted Violent Behavior Description: n/a Does patient have access to weapons?: No Criminal Charges Pending?: No Does patient have a court date: No Is patient on probation?: No  Psychosis Hallucinations: None noted Delusions: None noted  Mental Status Report Appearance/Hygiene: Unremarkable Eye Contact: Fair Motor Activity: Freedom of movement Speech: Logical/coherent Level of Consciousness: Alert Mood: Depressed Affect: Depressed Anxiety Level: None Thought Processes: Coherent, Relevant Judgement:  Impaired Orientation: Person, Place, Time, Situation Obsessive Compulsive Thoughts/Behaviors: None  Cognitive Functioning Concentration: Decreased Memory: Recent Intact, Remote Intact IQ: Average Insight: Poor Impulse Control: Poor Appetite: Poor Weight Loss: 0 Weight Gain: 0 Sleep: Increased Vegetative Symptoms: None  ADLScreening [facility] Assessment Services) Patient's cognitive ability adequate to safely complete daily activities?: Yes Patient able to express need for assistance with ADLs?: Yes Independently performs ADLs?: Yes (appropriate for developmental age)        ADL Screening (condition at time of admission) Patient's cognitive ability adequate to safely complete daily activities?: Yes Is the patient deaf or have difficulty hearing?: No Does the patient have difficulty seeing, even when wearing glasses/contacts?: No Does the patient have difficulty concentrating, remembering, or making decisions?: No Patient able to express need for assistance with ADLs?: Yes Does the patient have difficulty dressing or bathing?: No Independently performs ADLs?: Yes (appropriate for developmental age)       Abuse/Neglect Assessment (Assessment to be complete while patient is alone) Physical Abuse: Denies Verbal Abuse: Denies Sexual Abuse: Denies     Advance Directives (For Healthcare) Does Patient Have a Medical Advance Directive?: Yes          Disposition:  Disposition Initial Assessment Completed for this Encounter: Yes Disposition of Patient: Inpatient treatment program Type of inpatient treatment program: Adult     Mollie Germany 11/25/2016 3:55 PM

## 2016-11-25 NOTE — ED Provider Notes (Signed)
[facility] DEPT Provider Note   CSN: [id number] Arrival date & time: 11/25/16  1650     History   Chief Complaint Chief Complaint  Patient presents with  . Depression    HPI Karee Christopherson is a 56 y.o. male.  HPI    56 year old male presents today at the request of behavioral health for medical clearance.  Please see behavioral health note for full psychiatric evaluation.  Patient reports he simply went to behavioral health at the request of his primary care PA as they noted he had superficial abrasions to his left wrist.  Patient notes he has been sleeping very well, has been depressed and cut himself.  He notes this was not an attempt to kill himself, he has no suicidal homicidal ideations.  Patient reports physically he is feeling well other than being tired from not sleeping.  Past Medical History:  Diagnosis Date  . Acute renal injury ([place]) 11/2013  . Anemia   . Anxiety   . Bladder cancer ([place]) 2013  . Complication of anesthesia    agitation w/awakening in 9/13,OK 01/24/12  . Depression   . Diabetes mellitus type 2, diet-controlled ([place]) PT STOPPED TAKING METFORMIN--  HAS BEEN WATCHING DIET, EXERCISING AND LOSING WT  . Frequency of urination   . GERD (gastroesophageal reflux disease)   . Gout    recent flair -bilateral feet--  STABLE  . Hypertension   . Kidney failure   . Nocturia   . Urgency incontinence     Patient Active Problem List   Diagnosis Date Noted  . Vitamin B12 deficiency 11/19/2016  . GERD (gastroesophageal reflux disease) 11/16/2016  . Suicidal ideation 11/16/2016  . Pyelonephritis 10/20/2015  . Hydronephrosis 10/20/2015  . E-coli UTI   . Anemia of chronic disease   . Acute encephalopathy 10/19/2014  . Altered mental status 10/19/2014  . Faintness   . Syncope 07/06/2014  . Syncope and collapse 07/05/2014  . CKD (chronic kidney disease), stage IV ([place]) 07/05/2014  . Acute coccygeal pain 07/05/2014  . Occipital scalp laceration  07/05/2014  . Diabetes mellitus type 2, diet-controlled ([place]) 07/05/2014  . Protein calorie malnutrition ([place]) 11/29/2013  . AKI (acute kidney injury) ([place]) 10/30/2013  . Hypercalcemia 06/26/2013  . UTI (lower urinary tract infection) 06/25/2013  . Bladder cancer ([place]) 05/09/2013  . Major depressive disorder, recurrent severe without psychotic features ([place]) 09/03/2005    Past Surgical History:  Procedure Laterality Date  . AV FISTULA PLACEMENT Left 05/31/2014   Procedure: LEFT RADIOCEPHALIC ARTERIOVENOUS (AV) FISTULA CREATION ;  Surgeon: Mal Misty, MD;  Location: [facility];  Service: Vascular;  Laterality: Left;  . BACK SURGERY    . BLADDER SURGERY  04/26/2013   at the cancer treatment center of [place] in  [place]    . CYSTOSCOPY W/ RETROGRADES Bilateral 07/10/2012   Procedure: CYSTOSCOPY WITH RETROGRADE PYELOGRAM;  Surgeon: Molli Hazard, MD;  Location: [facility];  Service: Urology;  Laterality: Bilateral;  . CYSTOSCOPY WITH URETHRAL DILATATION  01/24/2012   Procedure: CYSTOSCOPY WITH URETHRAL DILATATION;  Surgeon: Molli Hazard, MD;  Location: [facility];  Service: Urology;  Laterality: N/A;  . IR NEPHROSTOMY PLACEMENT LEFT  07/16/2016  . LUMBAR LAMINECTOMY  06-02-2005   W/ RESECTION NERVE ROOT  L4  -  L5  . NEPHROSTOMY TUBE PLACEMENT ([facility] [facility])  2015   right   . TRANSURETHRAL RESECTION OF BLADDER TUMOR  12/15/2011   Procedure: TRANSURETHRAL RESECTION OF BLADDER TUMOR (TURBT);  Surgeon: Molli Hazard, MD;  Location: [facility];  Service: Urology;  Laterality: N/A;  2 HRS   . TRANSURETHRAL RESECTION OF BLADDER TUMOR  01/24/2012   Procedure: TRANSURETHRAL RESECTION OF BLADDER TUMOR (TURBT);  Surgeon: Molli Hazard, MD;  [facility];  Service: Urology;  Laterality: N/A;  90 MIN   . TRANSURETHRAL RESECTION OF BLADDER TUMOR N/A 07/10/2012   Procedure: TRANSURETHRAL RESECTION OF BLADDER  TUMOR (TURBT);  Surgeon: Molli Hazard, MD;  Location: [facility];  Service: Urology;  Laterality: N/A;  . ulner artery repair Right 2009   rt -trauma /thrombectomy,repair       Home Medications    Prior to Admission medications   Medication Sig Start Date End Date Taking? Authorizing Provider  pantoprazole (PROTONIX) 40 MG tablet Take 40 mg by mouth 2 (two) times daily.  05/11/14   [provider]  QUEtiapine (SEROQUEL) 25 MG tablet Take 1 tablet (25 mg total) by mouth at bedtime. 11/19/16   Pucilowska, Herma Ard B, MD  sevelamer carbonate (RENVELA) 800 MG tablet Take 1 tablet by mouth 3 (three) times daily. 08/11/16   [provider]  sodium bicarbonate 650 MG tablet Take 2 tablets (1,300 mg total) by mouth 3 (three) times daily. Patient taking differently: Take 1,950 mg by mouth 2 (two) times daily.  10/23/14   Robbie Lis, MD  venlafaxine XR (EFFEXOR-XR) 150 MG 24 hr capsule Take 1 capsule (150 mg total) by mouth daily with breakfast. 11/19/16   Pucilowska, Jolanta B, MD  vitamin B-12 1000 MCG tablet Take 1 tablet (1,000 mcg total) by mouth daily. 11/19/16   Clovis Fredrickson, MD    Family History Family History  Problem Relation Age of Onset  . Diabetes Mellitus II Other   . Hypertension Other   . Bladder Cancer Neg Hx     Social History Social History  Substance Use Topics  . Smoking status: Former Smoker    Packs/day: 1.00    Years: 16.00    Types: Cigarettes    Quit date: 01/20/1992  . Smokeless tobacco: Former Systems developer    Types: [place] date: 01/20/1992  . Alcohol use No     Allergies   Lisinopril   Review of Systems Review of Systems  All other systems reviewed and are negative.    Physical Exam Updated Vital Signs BP 127/84 (BP Location: Right Arm)   Pulse 83   Temp 98.1 F (36.7 C) (Oral)   Resp 17   Ht 5\' 10"  (1.778 m)   Wt 83 kg (183 lb)   SpO2 100%   BMI 26.26 kg/m   Physical Exam    Constitutional: He is oriented to person, place, and time. He appears well-developed and well-nourished.  HENT:  Head: Normocephalic and atraumatic.  Eyes: Pupils are equal, round, and reactive to light. Conjunctivae are normal. Right eye exhibits no discharge. Left eye exhibits no discharge. No scleral icterus.  Neck: Normal range of motion. No JVD present. No tracheal deviation present.  Pulmonary/Chest: Effort normal. No stridor.  Neurological: He is alert and oriented to person, place, and time. Coordination normal.  Psychiatric: He has a normal mood and affect. His behavior is normal. Judgment and thought content normal.  Nursing note and vitals reviewed.   ED Treatments / Results  Labs (all labs ordered are listed, but only abnormal results are displayed) Labs Reviewed  COMPREHENSIVE METABOLIC PANEL - Abnormal; Notable for the  following:       Result Value   Potassium 2.9 (*)    Chloride 97 (*)    BUN 22 (*)    Creatinine, Ser 4.43 (*)    GFR calc non Af Amer 13 (*)    GFR calc Af Amer 16 (*)    All other components within normal limits  ACETAMINOPHEN LEVEL - Abnormal; Notable for the following:    Acetaminophen (Tylenol), Serum <10 (*)    All other components within normal limits  CBC - Abnormal; Notable for the following:    RBC 4.00 (*)    Hemoglobin 12.1 (*)    HCT 36.0 (*)    RDW 15.7 (*)    All other components within normal limits  ETHANOL  SALICYLATE LEVEL  RAPID URINE DRUG SCREEN, HOSP PERFORMED    EKG  EKG Interpretation None       Radiology No results found.  Procedures Procedures (including critical care time)  Medications Ordered in ED Medications  potassium chloride SA (K-DUR,KLOR-CON) CR tablet 40 mEq (not administered)  QUEtiapine (SEROQUEL) tablet 25 mg (not administered)     Initial Impression / Assessment and Plan / ED Course  I have reviewed the triage vital signs and the nursing notes.  Pertinent labs & imaging results that  were available during my care of the patient were reviewed by me and considered in my medical decision making (see chart for details).      Assessment/Plan: 56 year old male presents today at the request of behavioral health for evaluation and medical clearance.  Patient has hypokalemia here.  He is a dialysis patient.  He will be given 1 dose of potassium.  He will have dialysis tomorrow where he will have his labs checked.  Patient medically cleared awaiting behavioral health instruction.    Final Clinical Impressions(s) / ED Diagnoses   Final diagnoses:  Depression, unspecified depression type    New Prescriptions New Prescriptions   No medications on file     Francee Gentile 11/25/16 2145    Charlesetta Shanks, MD 11/29/16 1547

## 2016-11-25 NOTE — ED Notes (Signed)
Per service response, no tray made for pt. Kitchen not answering at this time. Pt with no dinner tray.  Will give pt [place] sandwich and other snacks.

## 2016-11-25 NOTE — ED Notes (Addendum)
Pt belongings inventoried and located in locker 3. Valuable inventoried and walked to security by this nurse.

## 2016-11-25 NOTE — BH Assessment (Signed)
[name] Boers, NP and Dr. Dwyane Dee recommend inpatient admission. Patient agrees to be transferred to [facility]. Will be seen there in consult tomorrow for re-evaluation

## 2016-11-25 NOTE — H&P (Signed)
Behavioral Health Medical Screening Exam  [name] [name] is an 56 y.o. male who arrived voluntarily to [facility] by the recommendation of Dr. Lovena Le to [facility] for psych evaluation, accompanied by his wife. Patient currently denies any SI/HI/VAH but has a recent lacerations to his left wrist around the dialysis port the point. 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   Total Time spent with patient: 30 minutes  Psychiatric Specialty Exam: Physical Exam  Constitutional: He is oriented to person, place, and time. He appears well-developed and well-nourished.  HENT:  Head: Normocephalic.  Eyes: Pupils are equal, round, and reactive to light.  Neck: Normal range of motion. Neck supple.  Cardiovascular: Normal rate and regular rhythm.   Respiratory: Effort normal and breath sounds normal.  GI: Soft. Bowel sounds are normal.  Genitourinary:  Genitourinary Comments: Deferred  Musculoskeletal: Normal range of motion.  Neurological: He is alert and oriented to person, place, and time.  Skin: Skin is warm and dry.       Review of Systems  Psychiatric/Behavioral: Positive for depression and suicidal ideas (patient currently denies SI but made a recent suicide gesture by cutting his wrist.). Negative for hallucinations, memory loss and substance abuse. The patient is nervous/anxious. The patient does not have insomnia.   All other systems reviewed and are negative.   Blood pressure 129/70, pulse 94, temperature 98.7 F (37.1 C), temperature source Oral, resp. rate 16, SpO2 100 %.There is no height or weight on file to calculate BMI.  General Appearance: Casual  Eye Contact:  Fair  Speech:  Clear and Coherent and Normal Rate  Volume:  Normal  Mood:  Anxious and Depressed  Affect:  Blunt  Thought Process:  Coherent and Goal Directed  Orientation:  Full (Time, Place, and Person)  Thought Content:  WDL and  Logical  Suicidal Thoughts:  No  Homicidal Thoughts:  No  Memory:  Immediate;   Good Recent;   Good Remote;   Fair  Judgement:  Good  Insight:  Present  Psychomotor Activity:  Normal  Concentration: Concentration: Good and Attention Span: Good  Recall:  Good  Fund of Knowledge:Good  Language: Good  Akathisia:  Negative  Handed:  Right  AIMS (if indicated):     Assets:  Communication Skills Desire for Improvement Financial Resources/Insurance Housing Leisure Time Physical Health Resilience Social Support  Sleep:       Musculoskeletal: Strength & Muscle Tone: within normal limits Gait & Station: normal Patient leans: N/A  Blood pressure 129/70, pulse 94, temperature 98.7 F (37.1 C), temperature source Oral, resp. rate 16, SpO2 100 %.  Recommendations: Treatment team reviewed patient case and determined that patient should be transferred to [facility] ED for overnight observation after which he will be assessed in the morning by a psychiatrist for further recommendations.   Vicenta Aly, NP 11/25/2016, 4:16 PM

## 2016-11-25 NOTE — Telephone Encounter (Signed)
Pt was referred to [facility] on 11/24/16 per Dr. Daron Offer, psychiatrist in this agency. Dr. Daron Offer reports he received a call from pt's PCP who asked for earlier psychiatric assessment due to concern for patient due to current cutting behaviors and ongoing passive SI. Dr Daron Offer, then referred pt to [facility] for intensive treatment. PHP cln's called pt and spoke to his wife. Pt's wife shares concern for pt and states that on Tuesday he cut himself and "ran away" and she had to find him. Cln set up intake appointment for PHP on Thursday 8/23. On morning of 8/23, psychiatrist over the PHP, Dr. Lovena Le, received a message from a dialysis social worker working with pt. SW shared information that pt reported to dialysis yesterday with severe cuts on his arms at and around the dialysis site. SW reports when questioned, pt was not forthcoming with information and they were concerned. SW states wife was called who shared the wounds were self-inflicted and that she had found him the previous day, Tuesday, in the attic surrounded by knives and hiding his arms. Wife shared with SW they had an appt with PHP today. SW reports they did complete dialysis on another arm. SW states due to her concerns and pt's reticence to share information she wanted to pass this information along. SW strongly recommends wife be present at any assessment for pt. After receiving the call, Dr. Lovena Le, and PHP cln's reviewed pt's chart. Dr Lovena Le, psychiatrist over [place] states concern for pt's safety and states that his cutting behaviors/SI and secrecy regarding his behaviors are "commitable actions" and recommends pt be seen to go inpatient. Cln conferred with clinical manager, Beather [facility], who agrees. Cln called pt's wife and informed her due to medical concerns for safety, it is recommended pt be seen at [facility] instead of [place] [facility] to be assessed for inpatient needs. Pt's wife reports understanding, states she knows where the  is located, and states she will bring pt to [facility]  within the time they would have presented to OP. Cln called AC at [facility] per request of Beather Arbour to inform the [facility] of concerns and that pt should be arriving soon.   Lorin Glass, MSW, LCSW, LCAS

## 2016-11-25 NOTE — ED Notes (Signed)
ED Provider at bedside. 

## 2016-11-25 NOTE — Progress Notes (Signed)
Pt was referred to [facility] on 11/24/16 per Dr. Daron Offer, psychiatrist in this agency. Dr. Daron Offer reports he received a call from pt's PCP who asked for earlier psychiatric assessment due to concern for patient due to current cutting behaviors and ongoing passive SI. Dr Daron Offer, then referred pt to [facility] for intensive treatment. PHP cln's called pt and spoke to his wife. Pt's wife shares concern for pt and states that on Tuesday he cut himself and "ran away" and she had to find him. Cln set up intake appointment for PHP on Thursday 8/23. On morning of 8/23, psychiatrist over the PHP, Dr. Lovena Le, received a message from a dialysis social worker working with pt. SW shared information that pt reported to dialysis yesterday with severe cuts on his arms at and around the dialysis site. SW reports when questioned, pt was not forthcoming with information and they were concerned. SW states wife was called who shared the wounds were self-inflicted and that she had found him the previous day, Tuesday, in the attic surrounded by knives and hiding his arms. Wife shared with SW they had an appt with PHP today. SW reports they did complete dialysis on another arm. SW states due to her concerns and pt's reticence to share information she wanted to pass this information along. SW strongly recommends wife be present at any assessment for pt. After receiving the call, Dr. Lovena Le, and PHP cln's reviewed pt's chart. Dr Lovena Le, psychiatrist over [place] states concern for pt's safety and states that his cutting behaviors/SI and secrecy regarding his behaviors are "commitable actions" and recommends pt be seen to go inpatient. Cln conferred with clinical manager, Beather [facility], who agrees. Cln called pt's wife and informed her due to medical concerns for safety, it is recommended pt be seen at [facility] instead of [place] OP to be assessed for inpatient needs. Pt's wife reports understanding, states she knows where the  is located, and states she will bring pt to [facility]  within the time they would have presented to OP. Cln called AC at [facility] per request of Beather Arbour to inform the [facility] of concerns and that pt should be arriving soon.   Lorin Glass, MSW, LCSW, LCAS

## 2016-11-25 NOTE — ED Notes (Signed)
Dinner tray ordered.

## 2016-11-26 DIAGNOSIS — R45851 Suicidal ideations: Secondary | ICD-10-CM

## 2016-11-26 DIAGNOSIS — K219 Gastro-esophageal reflux disease without esophagitis: Secondary | ICD-10-CM

## 2016-11-26 DIAGNOSIS — F332 Major depressive disorder, recurrent severe without psychotic features: Secondary | ICD-10-CM

## 2016-11-26 DIAGNOSIS — E538 Deficiency of other specified B group vitamins: Secondary | ICD-10-CM

## 2016-11-26 DIAGNOSIS — N186 End stage renal disease: Secondary | ICD-10-CM

## 2016-11-26 DIAGNOSIS — Z992 Dependence on renal dialysis: Secondary | ICD-10-CM | POA: Diagnosis not present

## 2016-11-26 DIAGNOSIS — Z87891 Personal history of nicotine dependence: Secondary | ICD-10-CM

## 2016-11-26 LAB — CBG MONITORING, ED
Glucose-Capillary: 106 mg/dL — ABNORMAL HIGH (ref 65–99)
Glucose-Capillary: 72 mg/dL (ref 65–99)

## 2016-11-26 LAB — RENAL FUNCTION PANEL
Albumin: 4 g/dL (ref 3.5–5.0)
Anion gap: 12 (ref 5–15)
BUN: 32 mg/dL — ABNORMAL HIGH (ref 6–20)
CO2: 25 mmol/L (ref 22–32)
Calcium: 9.6 mg/dL (ref 8.9–10.3)
Chloride: 103 mmol/L (ref 101–111)
Creatinine, Ser: 4.95 mg/dL — ABNORMAL HIGH (ref 0.61–1.24)
GFR calc Af Amer: 14 mL/min — ABNORMAL LOW (ref >60)
GFR calc non Af Amer: 12 mL/min — ABNORMAL LOW (ref >60)
Glucose, Bld: 101 mg/dL — ABNORMAL HIGH (ref 65–99)
Phosphorus: 4.5 mg/dL (ref 2.5–4.6)
Potassium: 4.3 mmol/L (ref 3.5–5.1)
Sodium: 140 mmol/L (ref 135–145)

## 2016-11-26 LAB — CBC
HCT: 34.5 % — ABNORMAL LOW (ref 39.0–52.0)
Hemoglobin: 11.4 g/dL — ABNORMAL LOW (ref 13.0–17.0)
MCH: 30.2 pg (ref 26.0–34.0)
MCHC: 33 g/dL (ref 30.0–36.0)
MCV: 91.3 fL (ref 78.0–100.0)
Platelets: 186 10*3/uL (ref 150–400)
RBC: 3.78 MIL/uL — ABNORMAL LOW (ref 4.22–5.81)
RDW: 16.1 % — ABNORMAL HIGH (ref 11.5–15.5)
WBC: 5.9 10*3/uL (ref 4.0–10.5)

## 2016-11-26 MED ORDER — DOXERCALCIFEROL 4 MCG/2ML IV SOLN
1.0000 ug | INTRAVENOUS | Status: DC
  Administered 2016-11-26: 1 ug via INTRAVENOUS

## 2016-11-26 MED ORDER — QUETIAPINE FUMARATE 25 MG PO TABS: 25.0000 mg | ORAL_TABLET | Freq: Every day | ORAL | Status: DC

## 2016-11-26 MED ORDER — RENA-VITE PO TABS: 1.0000 | ORAL_TABLET | Freq: Every day | ORAL | Status: DC

## 2016-11-26 MED ORDER — HEPARIN SODIUM (PORCINE) 1000 UNIT/ML DIALYSIS
100.0000 [IU]/kg | INTRAMUSCULAR | Status: DC | PRN
  Administered 2016-11-26: 8300 [IU] via INTRAVENOUS_CENTRAL
  Filled 2016-11-26 (×2): qty 9

## 2016-11-26 MED ORDER — LIDOCAINE-PRILOCAINE 2.5-2.5 % EX CREA
1.0000 "application " | TOPICAL_CREAM | CUTANEOUS | Status: DC | PRN
  Filled 2016-11-26: qty 5

## 2016-11-26 MED ORDER — PANTOPRAZOLE SODIUM 40 MG PO TBEC
40.0000 mg | DELAYED_RELEASE_TABLET | Freq: Two times a day (BID) | ORAL | Status: DC
  Administered 2016-11-26: 40 mg via ORAL
  Filled 2016-11-26: qty 1

## 2016-11-26 MED ORDER — DOXERCALCIFEROL 4 MCG/2ML IV SOLN
INTRAVENOUS | Status: AC
  Administered 2016-11-26: 1 ug via INTRAVENOUS
  Filled 2016-11-26: qty 2

## 2016-11-26 MED ORDER — LIDOCAINE HCL (PF) 1 % IJ SOLN: 5.0000 mL | INTRAMUSCULAR | Status: DC | PRN

## 2016-11-26 MED ORDER — SODIUM CHLORIDE 0.9 % IV SOLN: 100.0000 mL | INTRAVENOUS | Status: DC | PRN

## 2016-11-26 MED ORDER — PENTAFLUOROPROP-TETRAFLUOROETH EX AERO
1.0000 "application " | INHALATION_SPRAY | CUTANEOUS | Status: DC | PRN
  Filled 2016-11-26: qty 30

## 2016-11-26 MED ORDER — VITAMIN B-12 1000 MCG PO TABS
1000.0000 ug | ORAL_TABLET | Freq: Every day | ORAL | Status: DC
  Administered 2016-11-26: 1000 ug via ORAL
  Filled 2016-11-26: qty 1

## 2016-11-26 MED ORDER — VENLAFAXINE HCL ER 150 MG PO CP24
150.0000 mg | ORAL_CAPSULE | Freq: Every day | ORAL | Status: DC
  Administered 2016-11-26: 150 mg via ORAL
  Filled 2016-11-26: qty 1

## 2016-11-26 MED ORDER — HEPARIN SODIUM (PORCINE) 1000 UNIT/ML DIALYSIS
1000.0000 [IU] | INTRAMUSCULAR | Status: DC | PRN
  Filled 2016-11-26: qty 1

## 2016-11-26 MED ORDER — SEVELAMER CARBONATE 800 MG PO TABS
800.0000 mg | ORAL_TABLET | Freq: Three times a day (TID) | ORAL | Status: DC
  Administered 2016-11-26: 800 mg via ORAL
  Filled 2016-11-26 (×4): qty 1

## 2016-11-26 MED ORDER — ALTEPLASE 2 MG IJ SOLR: 2.0000 mg | Freq: Once | INTRAMUSCULAR | Status: DC | PRN

## 2016-11-26 NOTE — ED Notes (Signed)
[facility] notified pt up for DC. Notified EDP. EDP reports he will come see patient and then DC.

## 2016-11-26 NOTE — Progress Notes (Signed)
Patient dialyzes MWF at [facility] in [place].  He has gradually been losing weight.  Post HD wt were 86 8/20 and 85.1 8/22  HD orders MWF 4 hr EDW 86.5 (lowered 8/17 ) 400/800/heparin 2800  2K 2 Ca no profile AVF Hectorol 1- last Mircera 100 8/22.    HD orders written for today pending BH placement.  Need to titrate to new EDW during hospitalization. K noted to be low at 2.9 - supplement K given yesterday - will start on 4 K bath and change as needed.  May need 3 K bath at d/c  Amalia Hailey, PA-C [facility].

## 2016-11-26 NOTE — ED Notes (Signed)
Patient back from Dialysis. Patient currently eating lunch.

## 2016-11-26 NOTE — Consult Note (Signed)
[facility] Consult   Reason for Consult:  Depression and suicide ideation and SIB Referring Physician:  Dr.Pfeiffer/Dr.Kumar Patient Identification: [name] [name] MRN:  [id number] Principal Diagnosis: <principal problem not specified> Diagnosis:   Patient Active Problem List   Diagnosis Date Noted  . ESRD needing dialysis ([place]) [N18.6, Z99.2] 11/26/2016  . Vitamin B12 deficiency [E53.8] 11/19/2016  . GERD (gastroesophageal reflux disease) [K21.9] 11/16/2016  . Suicidal ideation [R45.851] 11/16/2016  . Pyelonephritis [N12] 10/20/2015  . Hydronephrosis [N13.30] 10/20/2015  . E-coli UTI [N39.0, B96.20]   . Anemia of chronic disease [D63.8]   . Acute encephalopathy [G93.40] 10/19/2014  . Altered mental status [R41.82] 10/19/2014  . Faintness [R55]   . Syncope [R55] 07/06/2014  . Syncope and collapse [R55] 07/05/2014  . CKD (chronic kidney disease), stage IV ([place]) [N18.4] 07/05/2014  . Acute coccygeal pain [M53.3] 07/05/2014  . Occipital scalp laceration [S01.01XA] 07/05/2014  . Diabetes mellitus type 2, diet-controlled ([place]) [E11.9] 07/05/2014  . Protein calorie malnutrition ([place]) [E46] 11/29/2013  . AKI (acute kidney injury) ([place]) [N17.9] 10/30/2013  . Hypercalcemia [E83.52] 06/26/2013  . UTI (lower urinary tract infection) [N39.0] 06/25/2013  . Bladder cancer ([place]) [C67.9] 05/09/2013  . Major depressive disorder, recurrent severe without psychotic features ([place]) [F33.2] 09/03/2005    Total Time spent with patient: 1 hour  Subjective:   [name] [name] is a 56 y.o. male patient admitted with .  HPI:  [name] [name] is an 56 y.o. male, seen, chart reviewed for this face-to-face psychiatric consultation and evaluation of increased symptoms of depression, recent suicidal ideation, recent acute psychiatric hospitalization at [facility] and current suicidal behavior like self-injurious behaviors and left forearm with a scissors.Patient reported his cuts on his  left forearm are very superficial and has not required any sutures. 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 patient also reported he was not filled his Seroquel prescription from pharmacy after discharged from the [facility] and felt his medication made him wired up and not able to sleep at bedtime. Patient denies current symptoms of suicidal ideation, intention or plans patient is able to participate in treatment including hemodialysis without any difficulties. Patient reportedly slept well and feels relaxed and calm during my evaluation.  Patient is willing to participate in outpatient medication management including Seroquel 25 mg at bedtime and Effexor XR 150 mg daily morning with breakfast as per discharge orders.  He was diagnosed with bladder cancer several years ago but recently developed kidney problems and in May he started hemodialysis. In spite of medical problems the patient has been trying to develop a business but has had multiple setbacksrecently. He used to be a Administrator but now receives disability. The patient lives with his wife and children. 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 Reports increased stress around financial issues.   Past Psychiatric History: Patient has recent acute psychiatric hospitalization at [facility] on 11/16/2016 - 11/19/2016.  There is one psychiatric hospitalization in 2007 after an overdose. He has been maintained on Celexa for many years  Risk to Self: Is patient at risk for suicide?: No, but patient needs Medical Clearance (Patient denies SI and HI) Risk to Others:   Prior Inpatient Therapy:   Prior Outpatient Therapy:    Past Medical History:  Past Medical  History:  Diagnosis Date  . Acute renal injury ([place]) 11/2013  . Anemia   .  Anxiety   . Bladder cancer ([place]) 2013  . Complication of anesthesia    agitation w/awakening in 9/13,OK 01/24/12  . Depression   . Diabetes mellitus type 2, diet-controlled ([place]) PT STOPPED TAKING METFORMIN--  HAS BEEN WATCHING DIET, EXERCISING AND LOSING WT  . Frequency of urination   . GERD (gastroesophageal reflux disease)   . Gout    recent flair -bilateral feet--  STABLE  . Hypertension   . Kidney failure   . Nocturia   . Urgency incontinence     Past Surgical History:  Procedure Laterality Date  . AV FISTULA PLACEMENT Left 05/31/2014   Procedure: LEFT RADIOCEPHALIC ARTERIOVENOUS (AV) FISTULA CREATION ;  Surgeon: Mal Misty, MD;  Location: [facility];  Service: Vascular;  Laterality: Left;  . BACK SURGERY    . BLADDER SURGERY  04/26/2013   at the cancer treatment center of [place] in  [place]    . CYSTOSCOPY W/ RETROGRADES Bilateral 07/10/2012   Procedure: CYSTOSCOPY WITH RETROGRADE PYELOGRAM;  Surgeon: Molli Hazard, MD;  Location: [facility];  Service: Urology;  Laterality: Bilateral;  . CYSTOSCOPY WITH URETHRAL DILATATION  01/24/2012   Procedure: CYSTOSCOPY WITH URETHRAL DILATATION;  Surgeon: Molli Hazard, MD;  Location: [facility];  Service: Urology;  Laterality: N/A;  . IR NEPHROSTOMY PLACEMENT LEFT  07/16/2016  . LUMBAR LAMINECTOMY  06-02-2005   W/ RESECTION NERVE ROOT  L4  -  L5  . NEPHROSTOMY TUBE PLACEMENT ([place] [facility])  2015   right   . TRANSURETHRAL RESECTION OF BLADDER TUMOR  12/15/2011   Procedure: TRANSURETHRAL RESECTION OF BLADDER TUMOR (TURBT);  Surgeon: Molli Hazard, MD;  Location: [facility];  Service: Urology;  Laterality: N/A;  2 HRS   . TRANSURETHRAL RESECTION OF BLADDER TUMOR  01/24/2012   Procedure: TRANSURETHRAL RESECTION OF BLADDER TUMOR (TURBT);  Surgeon: Molli Hazard, MD;  Location: [facility];  Service: Urology;  Laterality: N/A;  90 MIN   . TRANSURETHRAL  RESECTION OF BLADDER TUMOR N/A 07/10/2012   Procedure: TRANSURETHRAL RESECTION OF BLADDER TUMOR (TURBT);  Surgeon: Molli Hazard, MD;  Location: [facility];  Service: Urology;  Laterality: N/A;  . ulner artery repair Right 2009   rt -trauma /thrombectomy,repair   Family History:  Family History  Problem Relation Age of Onset  . Diabetes Mellitus II Other   . Hypertension Other   . Bladder Cancer Neg Hx    Family Psychiatric  History: unknown Social History:  History  Alcohol Use No     History  Drug Use No    Social History   Social History  . Marital status: Married    Spouse name: N/A  . Number of children: N/A  . Years of education: N/A   Social History Main Topics  . Smoking status: Former Smoker    Packs/day: 1.00    Years: 16.00    Types: Cigarettes    Quit date: 01/20/1992  . Smokeless tobacco: Former Systems developer    Types: [place] date: 01/20/1992  . Alcohol use No  . Drug use: No  . Sexual activity: Not Currently   Other Topics Concern  . None   Social History Narrative  . None   Additional Social History:    Allergies:   Allergies  Allergen Reactions  . Lisinopril Cough  Labs:  Results for orders placed or performed during the hospital encounter of 11/25/16 (from the past 48 hour(s))  Comprehensive metabolic panel     Status: Abnormal   Collection Time: 11/25/16  5:48 PM  Result Value Ref Range   Sodium 138 135 - 145 mmol/L   Potassium 2.9 (L) 3.5 - 5.1 mmol/L   Chloride 97 (L) 101 - 111 mmol/L   CO2 30 22 - 32 mmol/L   Glucose, Bld 83 65 - 99 mg/dL   BUN 22 (H) 6 - 20 mg/dL   Creatinine, Ser 4.43 (H) 0.61 - 1.24 mg/dL   Calcium 10.0 8.9 - 10.3 mg/dL   Total Protein 7.6 6.5 - 8.1 g/dL   Albumin 4.5 3.5 - 5.0 g/dL   AST 22 15 - 41 U/L   ALT 31 17 - 63 U/L   Alkaline Phosphatase 96 38 - 126 U/L   Total Bilirubin 0.5 0.3 - 1.2 mg/dL   GFR calc non Af Amer 13 (L) >60 mL/min   GFR calc Af Amer 16 (L) >60 mL/min     Comment: (NOTE) The eGFR has been calculated using the CKD EPI equation. This calculation has not been validated in all clinical situations. eGFR's persistently <60 mL/min signify possible Chronic Kidney Disease.    Anion gap 11 5 - 15  Ethanol     Status: None   Collection Time: 11/25/16  5:48 PM  Result Value Ref Range   Alcohol, Ethyl (B) <5 <5 mg/dL    Comment:        LOWEST DETECTABLE LIMIT FOR SERUM ALCOHOL IS 5 mg/dL FOR MEDICAL PURPOSES ONLY   Salicylate level     Status: None   Collection Time: 11/25/16  5:48 PM  Result Value Ref Range   Salicylate Lvl <9.4 2.8 - 30.0 mg/dL  Acetaminophen level     Status: Abnormal   Collection Time: 11/25/16  5:48 PM  Result Value Ref Range   Acetaminophen (Tylenol), Serum <10 (L) 10 - 30 ug/mL    Comment:        THERAPEUTIC CONCENTRATIONS VARY SIGNIFICANTLY. A RANGE OF 10-30 ug/mL MAY BE AN EFFECTIVE CONCENTRATION FOR MANY PATIENTS. HOWEVER, SOME ARE BEST TREATED AT CONCENTRATIONS OUTSIDE THIS RANGE. ACETAMINOPHEN CONCENTRATIONS >150 ug/mL AT 4 HOURS AFTER INGESTION AND >50 ug/mL AT 12 HOURS AFTER INGESTION ARE OFTEN ASSOCIATED WITH TOXIC REACTIONS.   cbc     Status: Abnormal   Collection Time: 11/25/16  5:48 PM  Result Value Ref Range   WBC 8.3 4.0 - 10.5 K/uL   RBC 4.00 (L) 4.22 - 5.81 MIL/uL   Hemoglobin 12.1 (L) 13.0 - 17.0 g/dL   HCT 36.0 (L) 39.0 - 52.0 %   MCV 90.0 78.0 - 100.0 fL   MCH 30.3 26.0 - 34.0 pg   MCHC 33.6 30.0 - 36.0 g/dL   RDW 15.7 (H) 11.5 - 15.5 %   Platelets 220 150 - 400 K/uL  Rapid urine drug screen (hospital performed)     Status: None   Collection Time: 11/25/16  6:25 PM  Result Value Ref Range   Opiates NONE DETECTED NONE DETECTED   Cocaine NONE DETECTED NONE DETECTED   Benzodiazepines NONE DETECTED NONE DETECTED   Amphetamines NONE DETECTED NONE DETECTED   Tetrahydrocannabinol NONE DETECTED NONE DETECTED   Barbiturates NONE DETECTED NONE DETECTED    Comment:        DRUG SCREEN FOR  MEDICAL PURPOSES ONLY.  IF CONFIRMATION IS NEEDED FOR ANY PURPOSE, NOTIFY  LAB WITHIN 5 DAYS.        LOWEST DETECTABLE LIMITS FOR URINE DRUG SCREEN Drug Class       Cutoff (ng/mL) Amphetamine      1000 Barbiturate      200 Benzodiazepine   546 Tricyclics       503 Opiates          300 Cocaine          300 THC              50   CBC     Status: Abnormal   Collection Time: 11/26/16  9:28 AM  Result Value Ref Range   WBC 5.9 4.0 - 10.5 K/uL   RBC 3.78 (L) 4.22 - 5.81 MIL/uL   Hemoglobin 11.4 (L) 13.0 - 17.0 g/dL   HCT 34.5 (L) 39.0 - 52.0 %   MCV 91.3 78.0 - 100.0 fL   MCH 30.2 26.0 - 34.0 pg   MCHC 33.0 30.0 - 36.0 g/dL   RDW 16.1 (H) 11.5 - 15.5 %   Platelets 186 150 - 400 K/uL  Renal function panel     Status: Abnormal   Collection Time: 11/26/16  9:28 AM  Result Value Ref Range   Sodium 140 135 - 145 mmol/L   Potassium 4.3 3.5 - 5.1 mmol/L    Comment: NO VISIBLE HEMOLYSIS   Chloride 103 101 - 111 mmol/L   CO2 25 22 - 32 mmol/L   Glucose, Bld 101 (H) 65 - 99 mg/dL   BUN 32 (H) 6 - 20 mg/dL   Creatinine, Ser 4.95 (H) 0.61 - 1.24 mg/dL   Calcium 9.6 8.9 - 10.3 mg/dL   Phosphorus 4.5 2.5 - 4.6 mg/dL   Albumin 4.0 3.5 - 5.0 g/dL   GFR calc non Af Amer 12 (L) >60 mL/min   GFR calc Af Amer 14 (L) >60 mL/min    Comment: (NOTE) The eGFR has been calculated using the CKD EPI equation. This calculation has not been validated in all clinical situations. eGFR's persistently <60 mL/min signify possible Chronic Kidney Disease.    Anion gap 12 5 - 15  CBG monitoring, ED     Status: Abnormal   Collection Time: 11/26/16  9:34 AM  Result Value Ref Range   Glucose-Capillary 106 (H) 65 - 99 mg/dL    Current Facility-Administered Medications  Medication Dose Route Frequency Provider Last Rate Last Dose  . 0.9 %  sodium chloride infusion  100 mL Intravenous PRN Alric Seton, PA-C      . 0.9 %  sodium chloride infusion  100 mL Intravenous PRN Alric Seton, PA-C      .  alteplase (CATHFLO ACTIVASE) injection 2 mg  2 mg Intracatheter Once PRN Alric Seton, PA-C      . doxercalciferol (HECTOROL) injection 1 mcg  1 mcg Intravenous Q M,W,F-HD Alric Seton, PA-C   1 mcg at 11/26/16 1058  . heparin injection 1,000 Units  1,000 Units Dialysis PRN Alric Seton, PA-C      . heparin injection 8,300 Units  100 Units/kg Dialysis PRN Alric Seton, PA-C   8,300 Units at 11/26/16 1002  . lidocaine (PF) (XYLOCAINE) 1 % injection 5 mL  5 mL Intradermal PRN Alric Seton, PA-C      . lidocaine-prilocaine (EMLA) cream 1 application  1 application Topical PRN Alric Seton, PA-C      . multivitamin (RENA-VIT) tablet 1 tablet  1 tablet Oral QHS Alric Seton, PA-C      .  pantoprazole (PROTONIX) EC tablet 40 mg  40 mg Oral BID Mesner, Corene Cornea, MD   40 mg at 11/26/16 0908  . pentafluoroprop-tetrafluoroeth (GEBAUERS) aerosol 1 application  1 application Topical PRN Alric Seton, PA-C      . potassium chloride SA (K-DUR,KLOR-CON) CR tablet 40 mEq  40 mEq Oral BID Okey Regal, PA-C   40 mEq at 11/26/16 0932  . QUEtiapine (SEROQUEL) tablet 25 mg  25 mg Oral QHS HedgesDellis Filbert, PA-C   25 mg at 11/25/16 2203  . QUEtiapine (SEROQUEL) tablet 25 mg  25 mg Oral QHS Mesner, Jason, MD      . sevelamer carbonate (RENVELA) tablet 800 mg  800 mg Oral TID WC Mesner, Corene Cornea, MD   800 mg at 11/26/16 0908  . venlafaxine XR (EFFEXOR-XR) 24 hr capsule 150 mg  150 mg Oral Q breakfast Mesner, Corene Cornea, MD   150 mg at 11/26/16 0908  . vitamin B-12 (CYANOCOBALAMIN) tablet 1,000 mcg  1,000 mcg Oral Daily Mesner, Corene Cornea, MD   1,000 mcg at 11/26/16 0908   Current Outpatient Prescriptions  Medication Sig Dispense Refill  . pantoprazole (PROTONIX) 40 MG tablet Take 40 mg by mouth 2 (two) times daily.   6  . QUEtiapine (SEROQUEL) 25 MG tablet Take 1 tablet (25 mg total) by mouth at bedtime. 30 tablet 1  . sevelamer carbonate (RENVELA) 800 MG tablet Take 1 tablet by mouth 3 (three) times daily.  1   . venlafaxine XR (EFFEXOR-XR) 150 MG 24 hr capsule Take 1 capsule (150 mg total) by mouth daily with breakfast. 30 capsule 1  . vitamin B-12 1000 MCG tablet Take 1 tablet (1,000 mcg total) by mouth daily. 30 tablet 1    Musculoskeletal: Strength & Muscle Tone: within normal limits Gait & Station: normal Patient leans: N/A  Psychiatric Specialty Exam: Physical Exam  ROS  Blood pressure 108/66, pulse 79, temperature 98 F (36.7 C), resp. rate 17, height _0  (1.778 m), weight 83 kg (183 lb), SpO2 100 %.Body mass index is 26.26 kg/m.  General Appearance: Casual  Eye Contact:  Good  Speech:  Clear and Coherent  Volume:  Decreased  Mood:  Depressed  Affect:  Constricted and Depressed  Thought Process:  Coherent and Goal Directed  Orientation:  Full (Time, Place, and Person)  Thought Content:  Rumination  Suicidal Thoughts:  No  Homicidal Thoughts:  No  Memory:  Immediate;   Good Recent;   Fair Remote;   Fair  Judgement:  Intact  Insight:  Good  Psychomotor Activity:  Decreased  Concentration:  Concentration: Good and Attention Span: Fair  Recall:  Good  Fund of Knowledge:  Good  Language:  Good  Akathisia:  Negative  Handed:  Right  AIMS (if indicated):     Assets:  Communication Skills Desire for Improvement Financial Resources/Insurance Housing Intimacy Leisure Time Resilience Social Support Talents/Skills Transportation  ADL's:  Intact  Cognition:  WNL  Sleep:        Treatment Plan Summary: Patient has been diagnosed with a major depressive disorder, recurrent severe without psychotic features. Patient has been under significant stress from his financial, business and health related problems. Patient is calm and cooperative and pleasant during this evaluation and also actively participated in hemodialysis and contracting for safety at this time.  Spoke with the patient family with patient consent and made a plan to discharge home tonight with the plan of  participating intensive outpatient program at behavioral [facility] and also outpatient management. Patient  was provided additional resources for the outpatient medication management.  Discharge him to his family with the above plans.  Patient does not meet criteria for involuntary commitment Patient has supportive family members Patient has no substance abuse problems Patient endorses that he will be compliant with his medication Effexor XR 150 mg daily with breakfast and Seroquel 25 mg half tablet to 1 tablet at bedtime for insomnia  Appreciate psychiatric consultation and we sign off as of today Please contact [phone] or [phone] if needs further assistance   Disposition: Supportive therapy provided about ongoing stressors.  Ambrose Finland, MD 11/26/2016 1:08 PM

## 2016-11-26 NOTE — ED Provider Notes (Signed)
Nursing called report that the [facility] team felt he was stable for discharge.  Chart was reviewed and the behavioral health team felt he was stable for outpatient management and continuing his outpatient medication regimen. Patient will continue workup with his PCP and nephrology team for his dialysis management.  Patient was assessed by me and had no physical complaints. Patient agreed with plan for discharge.  Patient was discharged in good condition with improvement in symptoms.    Clinical Impression: 1. Depression, unspecified depression type     Disposition: Discharge  Condition: Good  I have discussed the results, Dx and Tx plan with the pt(& family if present). He/she/they expressed understanding and agree(s) with the plan. Discharge instructions discussed at great length. Strict return precautions discussed and pt &/or family have verbalized understanding of the instructions. No further questions at time of discharge.    Current Discharge Medication List      Follow Up: [facility] [address] [id number] mc [place]       Markie Heffernan, Gwenyth Allegra, MD 11/26/16 [phone]

## 2016-11-26 NOTE — ED Notes (Signed)
Pt ambulatory to restroom with steady gait.

## 2016-11-26 NOTE — Progress Notes (Signed)
Dialysis treatment completed.  500 mL ultrafiltrated and net fluid removal 0 mL.    Patient status unchanged. Lung sounds clear to ausculation in all fields. No edema. Cardiac: NSR.  Disconnected lines and removed needles.  Pressure held for 10 minutes and band aid/gauze dressing applied.  Report given to ED charge RN, Jerene Pitch.

## 2016-11-26 NOTE — ED Notes (Signed)
Patient eating lunch.

## 2016-11-26 NOTE — ED Notes (Signed)
Returned patient belongings from security and from locker. Pt getting dressed.

## 2016-11-26 NOTE — Discharge Instructions (Signed)
Please use the resources that [facility] team provided. Please follow-up with your outpatient psychiatry team. Please continue your medications as they directed. Please follow-up with your primary care physician for further management of your dialysis. If any symptoms change or worsen, please return to the nearest emergency department.

## 2016-11-26 NOTE — ED Notes (Signed)
Breakfast ordered 

## 2016-11-26 NOTE — ED Notes (Signed)
Report given to hemodialysis 

## 2016-11-26 NOTE — Progress Notes (Signed)
Patient arrived to unit per wheel chair, ED stretcher required for HD ( ED tech to bring stretcher).  Reviewed treatment plan and this RN agrees.  Report received from bedside RN, Carmelina Paddock'.  Consent obtained.  Patient A & O X 4. Lung sounds clear to ausculation in all fields. No edema. Cardiac: NSR.  Prepped LLAVF with alcohol and cannulated with two 15 gauge needles.  Pulsation of blood noted.  Flushed access well with saline per protocol.  Connected and secured lines and initiated tx at 1016.  UF goal of 500 mL and net fluid removal of 0 mL.  Will continue to monitor.

## 2016-11-26 NOTE — ED Notes (Signed)
[facility] provider at bedside with patient. Advised patient completed dialysis

## 2016-11-26 NOTE — ED Notes (Signed)
Patient ambulated to the restroom

## 2016-11-26 NOTE — Procedures (Signed)
   I was present at this dialysis session, have reviewed the session itself and made  appropriate changes Kelly Splinter MD [place] pager [phone]   11/26/2016, 2:56 PM

## 2016-11-27 LAB — HEPATITIS B SURFACE ANTIGEN: Hepatitis B Surface Ag: NEGATIVE

## 2016-12-02 ENCOUNTER — Other Ambulatory Visit (INDEPENDENT_AMBULATORY_CARE_PROVIDER_SITE_OTHER): Payer: BC Managed Care – PPO | Admitting: Licensed Clinical Social Worker

## 2016-12-02 DIAGNOSIS — F331 Major depressive disorder, recurrent, moderate: Secondary | ICD-10-CM

## 2016-12-02 NOTE — Psych (Signed)
Comprehensive Clinical Assessment (CCA) Note  12/02/2016 [name] [id number]  Visit Diagnosis:      ICD-10-CM   1. MDD (major depressive disorder), recurrent episode, moderate ([place]) F33.1       CCA Part One  Part One has been completed on paper by the patient.  (See scanned document in Chart Review)  CCA Part Two A  Intake/Chief Complaint:  CCA Intake With Chief Complaint CCA Part Two Date: 12/02/16 CCA Part Two Time: 1703 Chief Complaint/Presenting Problem: Pt presents as referral from PCP, post-discharge of inpatient for suicidal ideation. Pt was inpatient for suicidal ideation 11/16/16 - 11/19/16 and cutting behaviors. Pt states multiple health issues including past bladder cancer and kidney cancer. Pt is currently on dialysis MWF. 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 Pt states support in his wife and children and church family.  Pt reports "I lost all my confidence." Pt denies current SI/HI/psychosis.  Patients Currently Reported Symptoms/Problems: Pt states he feels "on edge" and anxious, pt reports poor sleep, depressed mood, low energy, recent passive SI.  Collateral Involvement: EPIC notes Individual's Strengths: Pt has strong family support, is willing to eneter treatment, and reports a desire to live.   Mental Health Symptoms Depression:  Depression: Change in energy/activity, Fatigue, Sleep (too much or little), Tearfulness, Worthlessness, Difficulty Concentrating  Mania:     Anxiety:   Anxiety: Difficulty concentrating, Restlessness, Sleep, Worrying  Psychosis:     Trauma:     Obsessions:     Compulsions:     Inattention:     Hyperactivity/Impulsivity:     Oppositional/Defiant Behaviors:     Borderline Personality:     Other Mood/Personality Symptoms:      Mental Status Exam Appearance and self-care  Stature:  Stature: Average  Weight:  Weight: Thin (Pt states he is 83 pounds currently)  Clothing:   Clothing: Disheveled, Casual  Grooming:  Grooming: Normal  Cosmetic use:  Cosmetic Use: None  Posture/gait:  Posture/Gait: Stooped  Motor activity:  Motor Activity: Not Remarkable  Sensorium  Attention:  Attention: Normal  Concentration:  Concentration: Anxiety interferes  Orientation:  Orientation: X5  Recall/memory:  Recall/Memory: Normal  Affect and Mood  Affect:  Affect: Depressed  Mood:  Mood: Depressed  Relating  Eye contact:  Eye Contact: Normal  Facial expression:  Facial Expression: Depressed  Attitude toward examiner:  Attitude Toward Examiner: Cooperative  Thought and Language  Speech flow: Speech Flow: Normal  Thought content:  Thought Content: Appropriate to mood and circumstances  Preoccupation:     Hallucinations:     Organization:     Transport planner of Knowledge:  Fund of Knowledge: Average  Intelligence:  Intelligence: Average  Abstraction:  Abstraction: Functional  Judgement:  Judgement: Fair  Art therapist:  Reality Testing: Realistic  Insight:  Insight: Fair  Decision Making:  Decision Making: Normal  Social Functioning  Social Maturity:     Social Judgement:     Stress  Stressors:  Stressors: Grief/losses, Work, Illness, Transitions  Coping Ability:  Coping Ability: English as a second language teacher Deficits:     Supports:      Family and Psychosocial History: Family history Marital status: Married Number of Years Married: 29 What types of issues is patient dealing with in the relationship?: Pt reports no current issues with marriage. Does patient have children?: Yes How many children?: 3 How is patient's relationship with their children?: 2 adult daughters, one 53 year old  son.  Pt reports good relationships  Childhood History:  Childhood History By whom was/is the patient raised?: Both parents Additional childhood history information: Father killed when pt was 29 in a hunting accident.   Description of patient's relationship with caregiver when  they were a child: Good relationship with mom when young.   Patient's description of current relationship with people who raised him/her: Good relationship with mother, who is 68. Feels stress from helping take care of her Does patient have siblings?: Yes Number of Siblings: 1 Description of patient's current relationship with siblings: brother died age 76 in a tractor accident. Did patient suffer any verbal/emotional/physical/sexual abuse as a child?: No Did patient suffer from severe childhood neglect?: No Has patient ever been sexually abused/assaulted/raped as an adolescent or adult?: No Was the patient ever a victim of a crime or a disaster?: No Witnessed domestic violence?: No Has patient been effected by domestic violence as an adult?: No  CCA Part Two B  Employment/Work Situation: Employment / Work Situation Employment situation: On disability Where is patient currently employed?: self employed: [employer].  [place] working on [employer]. 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 Why is patient on disability: loss of kidney How long has patient been on disability: 49 Patient's job has been impacted by current illness: Yes Describe how patient's job has been impacted: absent from work, stress getting routes completed currently What is the longest time patient has a held a job?: 19 years Where was the patient employed at that time?: [employer] Has patient ever been in the [employer]?: No Are There Guns or Other Weapons in [place]?: No (Pt owns several guns: nephew has two, Corporate investment banker has one)  Education: Education Did Teacher, adult education From [employer]?: No  Religion: Religion/Spirituality Are You A Religious Person?: Yes What is Your Religious Affiliation?: Baptist How Might This Affect Treatment?: Pt reports support from his church family  Leisure/Recreation: Leisure / Recreation Leisure and Hobbies: Pt states "just  work."  Exercise/Diet: Exercise/Diet Do You Exercise?: No Have You Gained or Lost A Significant Amount of Weight in the Past Six Months?: Yes-Lost Number of Pounds Lost?: 12 Do You Follow a Special Diet?: Yes Type of Diet: renal diet Do You Have Any Trouble Sleeping?: Yes Explanation of Sleeping Difficulties: Pt states difficulty falling asleep due to racing thoughts  CCA Part Two C  Alcohol/Drug Use: Alcohol / Drug Use Pain Medications: see MAR Prescriptions: see MAR Over the Counter: see MAR History of alcohol / drug use?: No history of alcohol / drug abuse                      CCA Part Three  ASAM's:  Six Dimensions of Multidimensional Assessment  Dimension 1:  Acute Intoxication and/or Withdrawal Potential:     Dimension 2:  Biomedical Conditions and Complications:     Dimension 3:  Emotional, Behavioral, or Cognitive Conditions and Complications:     Dimension 4:  Readiness to Change:     Dimension 5:  Relapse, Continued use, or Continued Problem Potential:     Dimension 6:  Recovery/Living Environment:      Substance use Disorder (SUD)    Social Function:     Stress:  Stress Stressors: Grief/losses, Work, Illness, Transitions Coping Ability: Overwhelmed Patient Takes Medications The Way The Doctor Instructed?: Yes Priority Risk: Moderate Risk  Risk Assessment- Self-Harm Potential: Risk Assessment For Self-Harm Potential  Thoughts of Self-Harm: No current thoughts Method: No plan Additional Information for Self-Harm Potential: Acts of Self-harm  Risk Assessment -Dangerous to Others Potential: Risk Assessment For Dangerous to Others Potential Method: No Plan Availability of Means: No access or NA  DSM5 Diagnoses: Patient Active Problem List   Diagnosis Date Noted  . ESRD needing dialysis ([place]) 11/26/2016  . Vitamin B12 deficiency 11/19/2016  . GERD (gastroesophageal reflux disease) 11/16/2016  . Suicidal ideation 11/16/2016  . Pyelonephritis  10/20/2015  . Hydronephrosis 10/20/2015  . E-coli UTI   . Anemia of chronic disease   . Acute encephalopathy 10/19/2014  . Altered mental status 10/19/2014  . Faintness   . Syncope 07/06/2014  . Syncope and collapse 07/05/2014  . CKD (chronic kidney disease), stage IV ([place]) 07/05/2014  . Acute coccygeal pain 07/05/2014  . Occipital scalp laceration 07/05/2014  . Diabetes mellitus type 2, diet-controlled ([place]) 07/05/2014  . Protein calorie malnutrition ([place]) 11/29/2013  . AKI (acute kidney injury) ([place]) 10/30/2013  . Hypercalcemia 06/26/2013  . UTI (lower urinary tract infection) 06/25/2013  . Bladder cancer ([place]) 05/09/2013  . Major depressive disorder, recurrent severe without psychotic features ([place]) 09/03/2005    Patient Centered Plan: Patient is on the following Treatment Plan(s):  Depression  Recommendations for Services/Supports/Treatments: Recommendations for Services/Supports/Treatments Recommendations For Services/Supports/Treatments: IOP (Intensive Outpatient Program) (Pt will benefit from IOP to increase ability to cope and manage with depression and work stressors. )  Treatment Plan Summary: Pt states: "I want my confidence back." Pt will begin in IOP to decrease depression symptoms, manage anxiety, and gain coping skills.     Referrals to Alternative Service(s): Referred to Alternative Service(s):   Place:   Date:   Time:    Referred to Alternative Service(s):   Place:   Date:   Time:    Referred to Alternative Service(s):   Place:   Date:   Time:    Referred to Alternative Service(s):   Place:   Date:   Time:     Lorin Glass MSW, LCSW, LCAS

## 2016-12-14 ENCOUNTER — Telehealth (HOSPITAL_COMMUNITY): Payer: Self-pay

## 2017-01-13 ENCOUNTER — Ambulatory Visit (HOSPITAL_COMMUNITY): Payer: Self-pay | Admitting: Psychiatry

## 2017-04-05 IMAGING — CR DG CHEST 2V
2 series · 2 of 2 positions shown · non-contrast
Comparison: Chest radiograph dated 10/30/2013

CLINICAL DATA: 54-year-old male with delirium

EXAM:
CHEST  2 VIEW

[w chest lat]
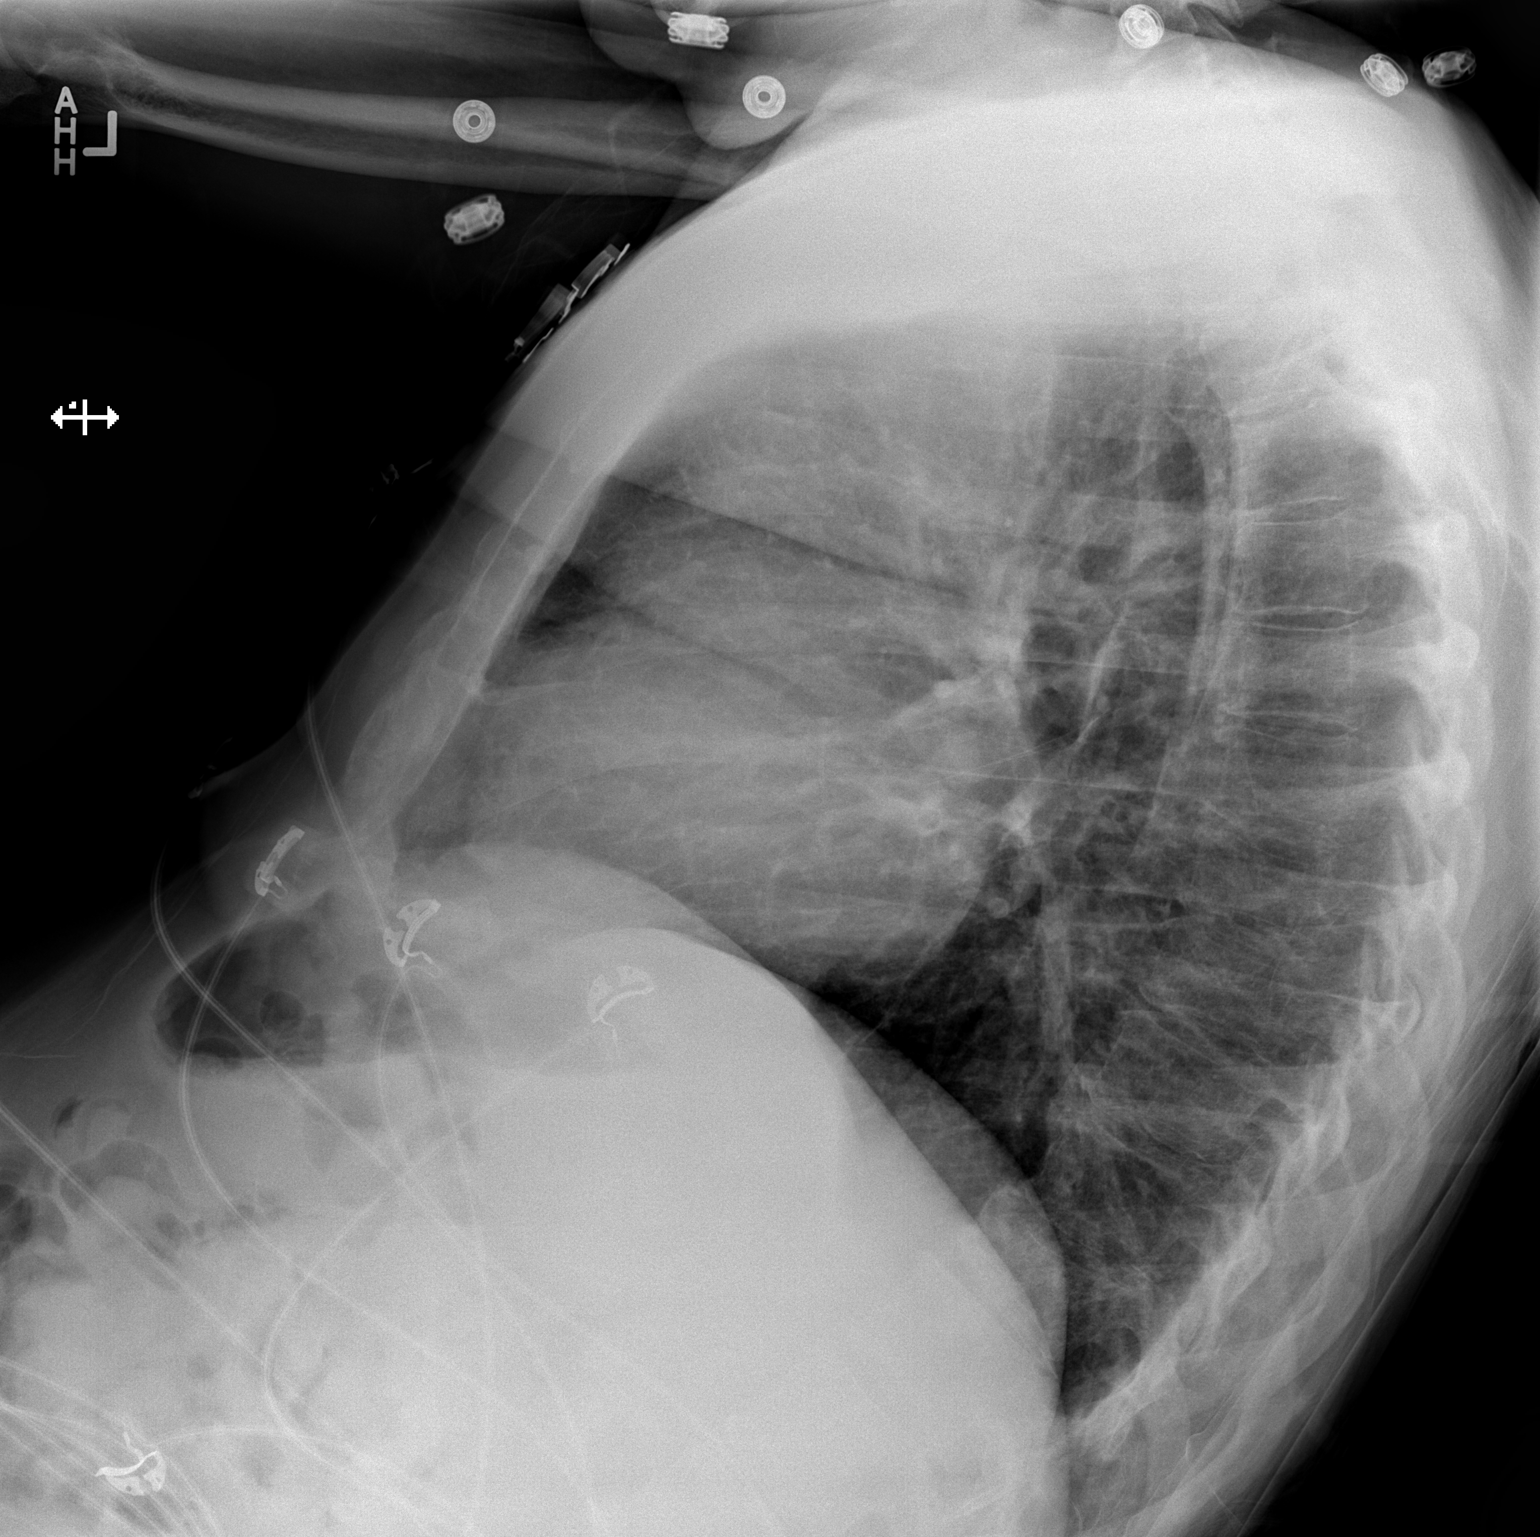

[x chest ap]
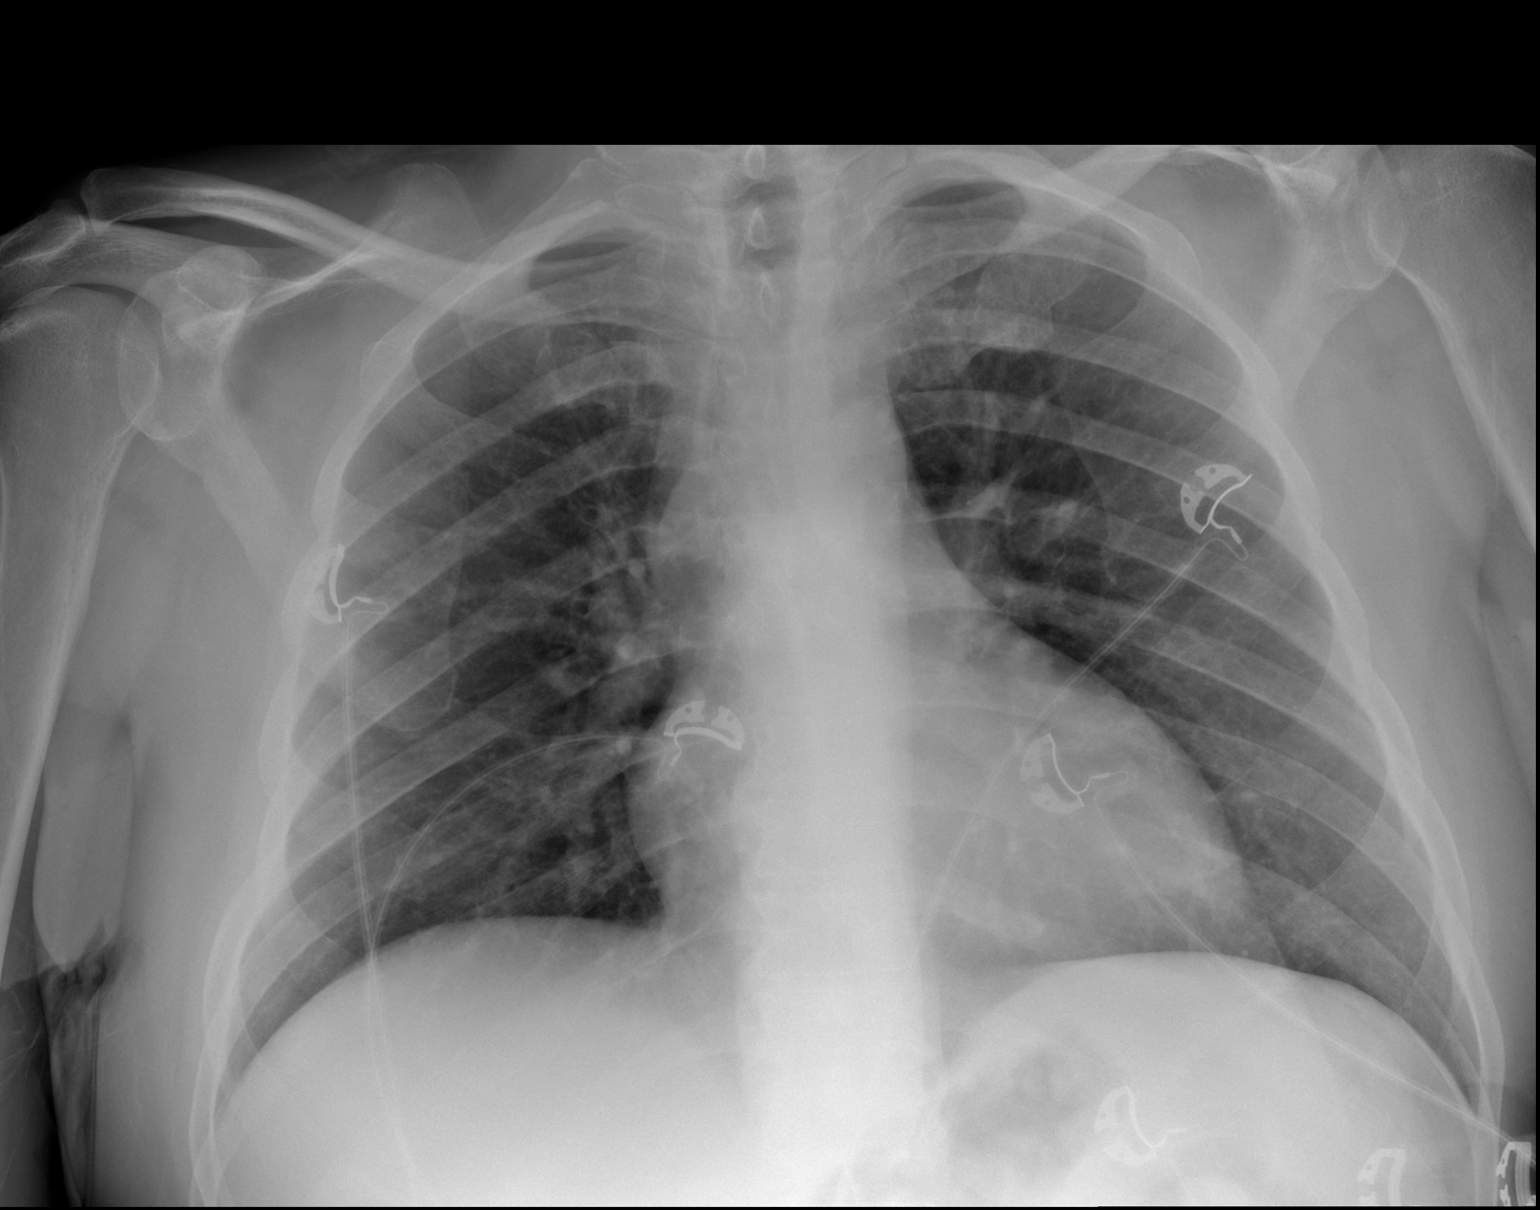

[2 of 2 positions shown; findings below may reference images not displayed]

FINDINGS: The heart size and mediastinal contours are within normal limits.
Both lungs are clear. The visualized skeletal structures are
unremarkable.
IMPRESSION: No active cardiopulmonary disease.

## 2017-07-06 ENCOUNTER — Ambulatory Visit (INDEPENDENT_AMBULATORY_CARE_PROVIDER_SITE_OTHER): Payer: BC Managed Care – PPO | Admitting: Psychiatry

## 2017-07-06 ENCOUNTER — Encounter: Payer: Self-pay | Admitting: Psychiatry

## 2017-07-06 ENCOUNTER — Other Ambulatory Visit: Payer: Self-pay

## 2017-07-06 VITALS — BP 155/88 | HR 109 | Temp 98.3°F | Wt 213.4 lb

## 2017-07-06 DIAGNOSIS — F330 Major depressive disorder, recurrent, mild: Secondary | ICD-10-CM | POA: Diagnosis not present

## 2017-07-06 MED ORDER — QUETIAPINE FUMARATE 25 MG PO TABS: 25.0000 mg | ORAL_TABLET | Freq: Every day | ORAL | 1 refills | Status: DC

## 2017-07-06 MED ORDER — VENLAFAXINE HCL ER 150 MG PO CP24: 150.0000 mg | ORAL_CAPSULE | Freq: Every day | ORAL | 1 refills | Status: AC

## 2017-07-06 NOTE — Progress Notes (Signed)
Psychiatric Initial Adult Assessment   Patient Identification: [name] [name] MRN:  [id number] Date of Evaluation:  07/06/2017 Referral Source: [facility] Chief Complaint:  ' I am fine." Chief Complaint    Establish Care; Other     Visit Diagnosis:    ICD-10-CM   1. MDD (major depressive disorder), recurrent episode, mild (HCC) F33.0 QUEtiapine (SEROQUEL) 25 MG tablet    venlafaxine XR (EFFEXOR-XR) 150 MG 24 hr capsule    History of Present Illness:  [name] [name] is a 56 y old CM, who is married, employed, lives in [place], has a history of depression, renal problems, status post bladder and renal cancer, presented to the clinic today for an evaluation.  Patient reports he was referred by his family medical doctor.  Patient reports a history of depression.  Patient reports he had a diagnosis of bladder cancer a few years ago and at that time he went on dialysis.  Patient reports even though he was on medications at that time for depression, since he went on dialysis he decompensated and became more depressed.  Patient had an admission to behavioral health unit at [facility] twice in August 2018.  Patient had another admission in 2007 at [facility].  Patient reports he was suicidal at that time.  Patient reports he is currently on Effexor XR 150 mg and is compliant with the same.  Patient reports his PMD is prescribing him his medications for depression. However , this has not been verified .  Patient reports he does not have any depressive symptoms at this time.  Patient denies any anhedonia or sadness.  Patient denies any appetite problems.  Patient denies any sleep issues.  Patient denies any trouble concentrating.  Patient denies any suicidality.  Patient also denies any anxiety symptoms.  Patient denies any perceptual disturbances.  Patient denies any insomnia.    Patient denies any PTSD symptoms from any past history of trauma.  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  Patient reports his family medical doctor wanted him to see a psychiatrist before he could clear him medically     Associated Signs/Symptoms: Depression Symptoms:  hx of depression, denies now (Hypo) Manic Symptoms:  denies Anxiety Symptoms:  denies Psychotic Symptoms:  denies PTSD Symptoms: Negative  Past Psychiatric History: Patient reports a history of major depressive disorder.  Patient has had 3 inpatient mental health admissions in the past.  Patient had 2 admissions in August 2018 at [facility].  Patient denies any history of suicide attempts.  Previous Psychotropic Medications: Yes , seroquel, effexor   Substance Abuse History in the last 12 months:  No.  Consequences of Substance Abuse: Negative  Past Medical History:  Past Medical History:  Diagnosis Date  . Acute renal injury ([place]) 11/2013  . Anemia   . Anxiety   . Bladder cancer ([place]) 2013  . Complication of anesthesia    agitation w/awakening in 9/13,OK 01/24/12  . Depression   . Diabetes mellitus type 2, diet-controlled ([place]) PT STOPPED TAKING METFORMIN--  HAS BEEN WATCHING DIET, EXERCISING AND LOSING WT  . Frequency of urination   . GERD (gastroesophageal reflux disease)   . Gout    recent flair -bilateral feet--  STABLE  . Hypertension   . Kidney failure   . Nocturia   . Urgency incontinence     Past Surgical History:  Procedure Laterality Date  . AV FISTULA PLACEMENT Left 05/31/2014   Procedure: LEFT RADIOCEPHALIC ARTERIOVENOUS (AV) FISTULA  CREATION ;  Surgeon: Mal Misty, MD;  Location: [facility];  Service: Vascular;  Laterality: Left;  . BACK SURGERY    . BLADDER SURGERY  04/26/2013   at the cancer treatment center of [place] in  [place]    . CYSTOSCOPY W/ RETROGRADES Bilateral 07/10/2012   Procedure: CYSTOSCOPY WITH RETROGRADE PYELOGRAM;  Surgeon: Molli Hazard, MD;  Location: [facility];  Service: Urology;  Laterality: Bilateral;  .  CYSTOSCOPY WITH URETHRAL DILATATION  01/24/2012   Procedure: CYSTOSCOPY WITH URETHRAL DILATATION;  Surgeon: Molli Hazard, MD;  Location: [facility];  Service: Urology;  Laterality: N/A;  . IR NEPHROSTOMY PLACEMENT LEFT  07/16/2016  . LUMBAR LAMINECTOMY  06-02-2005   W/ RESECTION NERVE ROOT  L4  -  L5  . NEPHROSTOMY TUBE PLACEMENT ([place] [facility])  2015   right   . TRANSURETHRAL RESECTION OF BLADDER TUMOR  12/15/2011   Procedure: TRANSURETHRAL RESECTION OF BLADDER TUMOR (TURBT);  Surgeon: Molli Hazard, MD;  Location: [facility];  Service: Urology;  Laterality: N/A;  2 HRS   . TRANSURETHRAL RESECTION OF BLADDER TUMOR  01/24/2012   Procedure: TRANSURETHRAL RESECTION OF BLADDER TUMOR (TURBT);  Surgeon: Molli Hazard, MD;  Location: [facility];  Service: Urology;  Laterality: N/A;  90 MIN   . TRANSURETHRAL RESECTION OF BLADDER TUMOR N/A 07/10/2012   Procedure: TRANSURETHRAL RESECTION OF BLADDER TUMOR (TURBT);  Surgeon: Molli Hazard, MD;  Location: [facility];  Service: Urology;  Laterality: N/A;  . ulner artery repair Right 2009   rt -trauma /thrombectomy,repair    Family Psychiatric History: Patient denies any family history of mental health problems  Family History:  Family History  Problem Relation Age of Onset  . Diabetes Mellitus II Other   . Hypertension Other   . Bladder Cancer Neg Hx     Social History:   Social History   Socioeconomic History  . Marital status: Married    Spouse name: roxanne  . Number of children: 3  . Years of education: Not on file  . Highest education level: Associate degree: occupational, Hotel manager, or vocational program  Occupational History    Comment: part time  Social Needs  . Financial resource strain: Not hard at all  . Food insecurity:    Worry: Never true    Inability: Never true  . Transportation needs:    Medical: No    Non-medical: No  Tobacco  Use  . Smoking status: Former Smoker    Packs/day: 1.00    Years: 16.00    Pack years: 16.00    Types: Cigarettes    Last attempt to quit: 01/20/1992    Years since quitting: 25.4  . Smokeless tobacco: Former Systems developer    Types: [place] date: 01/20/1992  Substance and Sexual Activity  . Alcohol use: No    Alcohol/week: 0.0 oz  . Drug use: No  . Sexual activity: Not Currently  Lifestyle  . Physical activity:    Days per week: 0 days    Minutes per session: 0 min  . Stress: Not at all  Relationships  . Social connections:    Talks on phone: Not on file    Gets together: Not on file    Attends religious service: More than 4 times per year    Active member of club or organization: No    Attends meetings of clubs or organizations: Never  Relationship status: Married  Other Topics Concern  . Not on file  Social History Narrative  . Not on file    Additional Social History: Patient is married.  He has 3 children.  2 of his children are adults.  He has 1 son who is 99 years old.  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  Pt lives in [place].  Allergies:   Allergies  Allergen Reactions  . Lisinopril Cough    Metabolic Disorder Labs: Lab Results  Component Value Date   HGBA1C 6.1 (H) 10/21/2015   MPG 128 10/21/2015   MPG 111 10/20/2014   No results found for: PROLACTIN Lab Results  Component Value Date   CHOL 146 11/16/2016   TRIG 534 (H) 11/16/2016   HDL 33 (L) 11/16/2016   CHOLHDL 4.4 11/16/2016   VLDL UNABLE TO CALCULATE IF TRIGLYCERIDE OVER 400 mg/dL 11/16/2016   LDLCALC UNABLE TO CALCULATE IF TRIGLYCERIDE OVER 400 mg/dL 11/16/2016   LDLCALC  10/30/2009    71        Total Cholesterol/HDL:CHD Risk Coronary Heart Disease Risk Table                     Men   Women  1/2 Average Risk   3.4   3.3  Average Risk       5.0   4.4  2 X Average Risk   9.6   7.1  3 X Average Risk  23.4   11.0        Use the calculated Patient Ratio above and the CHD Risk  Table to determine the patient's CHD Risk.        ATP III CLASSIFICATION (LDL):  <100     mg/dL   Optimal  100-129  mg/dL   Near or Above                    Optimal  130-159  mg/dL   Borderline  160-189  mg/dL   High  >190     mg/dL   Very High     Current Medications: Current Outpatient Medications  Medication Sig Dispense Refill  . pantoprazole (PROTONIX) 40 MG tablet Take 40 mg by mouth 2 (two) times daily.   6  . sevelamer carbonate (RENVELA) 800 MG tablet Take 1 tablet by mouth 3 (three) times daily.  1  . venlafaxine XR (EFFEXOR-XR) 150 MG 24 hr capsule Take 1 capsule (150 mg total) by mouth daily with breakfast. 30 capsule 1  . vitamin B-12 1000 MCG tablet Take 1 tablet (1,000 mcg total) by mouth daily. 30 tablet 1  . QUEtiapine (SEROQUEL) 25 MG tablet Take 1 tablet (25 mg total) by mouth at bedtime. (Patient not taking: Reported on 07/06/2017) 30 tablet 1   No current facility-administered medications for this visit.     Neurologic: Headache: No Seizure: No Paresthesias:No  Musculoskeletal: Strength & Muscle Tone: within normal limits Gait & Station: normal Patient leans: N/A  Psychiatric Specialty Exam: Review of Systems  Psychiatric/Behavioral: Negative for depression. The patient is not nervous/anxious.   All other systems reviewed and are negative.   Blood pressure (!) 155/88, pulse (!) 109, temperature 98.3 F (36.8 C), temperature source Oral, weight 213 lb 6.4 oz (96.8 kg).Body mass index is 30.62 kg/m.  General Appearance: Casual  Eye Contact:  Fair  Speech:  Clear and Coherent  Volume:  Normal  Mood:  euthymic  Affect:  Appropriate  Thought Process:  Goal  Directed and Descriptions of Associations: Intact  Orientation:  Full (Time, Place, and Person)  Thought Content:  Logical  Suicidal Thoughts:  No  Homicidal Thoughts:  No  Memory:  Immediate;   Fair Recent;   Fair Remote;   Fair  Judgement:  Fair  Insight:  Fair  Psychomotor Activity:   Normal  Concentration:  Concentration: Fair and Attention Span: Fair  Recall:  [employer] of Knowledge:Fair  Language: Fair  Akathisia:  No  Handed:  Right  AIMS (if indicated):  na  Assets:  Communication Skills Desire for [place] Talents/Skills Transportation Vocational/Educational  ADL's:  Intact  Cognition: WNL  Sleep:  fair    Treatment Plan Summary:Mumin is a 57 year old Caucasian male who has a history of depression, renal problems, history of bladder cancer, renal cancer, presented to the clinic today for an evaluation.  Patient reports he needed a psychiatric clearance , before his primary medical doctor would clear him for DOT.  Patient currently denies any depressive symptoms.  Patient denies any suicidality. Patient reports he remains compliant with his medications.  He reports he has good social support from his family. Pt reports he wants to continue care under his PMD for his psychiatric medication management. Discussed plan as noted below.  Medication management and Plan as noted below  Plan  MDD Continue Effexor XR 150 mg p.o. daily Continue Seroquel 25 mg p.o. nightly  PHQ 9 equals 0  I have reviewed notes in EHR regarding his past inpatient admissions at [facility].  Patient reports his family medical doctor prescribes him his medications.  Patient can continue his care with family medical doctor if that is what he and his provider agrees to do.  Patient can follow-up with the psychiatrist as needed , if he has worsening symptoms or if his PMD recommends the same.  As per patient request a letter has been typed up stating that patient was seen for a one-time appointment at our clinic.  Patient reports he was referred by family medical doctor for a psychiatric clearance before he would be cleared by his family medical doctor for DOT.  With patient's consent today's notes will be sent to his family medical doctor.  More than  50 % of the time was spent for psychoeducation and supportive psychotherapy and care coordination.  This note was generated in part or whole with voice recognition software. Voice recognition is usually quite accurate but there are transcription errors that can and very often do occur. I apologize for any typographical errors that were not detected and corrected.      Ursula Alert, MD 4/4/20198:33 AM

## 2017-07-07 ENCOUNTER — Encounter: Payer: Self-pay | Admitting: Psychiatry

## 2017-08-16 ENCOUNTER — Other Ambulatory Visit: Payer: Self-pay | Admitting: Family Medicine

## 2017-08-16 DIAGNOSIS — M79604 Pain in right leg: Secondary | ICD-10-CM

## 2017-08-17 ENCOUNTER — Ambulatory Visit
Admission: RE | Admit: 2017-08-17 | Discharge: 2017-08-17 | Disposition: A | Discharge status: Home / Self Care | Payer: BC Managed Care – PPO | Source: Ambulatory Visit | Attending: Family Medicine | Admitting: Family Medicine

## 2017-08-17 DIAGNOSIS — M79604 Pain in right leg: Secondary | ICD-10-CM

## 2017-09-13 ENCOUNTER — Inpatient Hospital Stay (HOSPITAL_COMMUNITY)
Admission: EM | Admit: 2017-09-13 | Discharge: 2017-09-14 | DRG: 640 | Disposition: A | Discharge status: Home / Self Care | Payer: BC Managed Care – PPO | Attending: Internal Medicine | Admitting: Internal Medicine

## 2017-09-13 ENCOUNTER — Encounter (HOSPITAL_COMMUNITY): Payer: Self-pay | Admitting: Emergency Medicine

## 2017-09-13 ENCOUNTER — Emergency Department (HOSPITAL_COMMUNITY): Payer: BC Managed Care – PPO

## 2017-09-13 DIAGNOSIS — E875 Hyperkalemia: Secondary | ICD-10-CM | POA: Diagnosis not present

## 2017-09-13 DIAGNOSIS — E8889 Other specified metabolic disorders: Secondary | ICD-10-CM | POA: Diagnosis present

## 2017-09-13 DIAGNOSIS — E119 Type 2 diabetes mellitus without complications: Secondary | ICD-10-CM

## 2017-09-13 DIAGNOSIS — K219 Gastro-esophageal reflux disease without esophagitis: Secondary | ICD-10-CM | POA: Diagnosis present

## 2017-09-13 DIAGNOSIS — Z888 Allergy status to other drugs, medicaments and biological substances status: Secondary | ICD-10-CM

## 2017-09-13 DIAGNOSIS — R9431 Abnormal electrocardiogram [ECG] [EKG]: Secondary | ICD-10-CM | POA: Diagnosis present

## 2017-09-13 DIAGNOSIS — Z91158 Patient's noncompliance with renal dialysis for other reason: Secondary | ICD-10-CM

## 2017-09-13 DIAGNOSIS — D631 Anemia in chronic kidney disease: Secondary | ICD-10-CM | POA: Diagnosis present

## 2017-09-13 DIAGNOSIS — I120 Hypertensive chronic kidney disease with stage 5 chronic kidney disease or end stage renal disease: Secondary | ICD-10-CM | POA: Diagnosis present

## 2017-09-13 DIAGNOSIS — C679 Malignant neoplasm of bladder, unspecified: Secondary | ICD-10-CM | POA: Diagnosis present

## 2017-09-13 DIAGNOSIS — Z9115 Patient's noncompliance with renal dialysis: Secondary | ICD-10-CM

## 2017-09-13 DIAGNOSIS — N186 End stage renal disease: Secondary | ICD-10-CM

## 2017-09-13 DIAGNOSIS — D638 Anemia in other chronic diseases classified elsewhere: Secondary | ICD-10-CM | POA: Diagnosis present

## 2017-09-13 DIAGNOSIS — Z79899 Other long term (current) drug therapy: Secondary | ICD-10-CM

## 2017-09-13 DIAGNOSIS — F419 Anxiety disorder, unspecified: Secondary | ICD-10-CM | POA: Diagnosis present

## 2017-09-13 DIAGNOSIS — Z87891 Personal history of nicotine dependence: Secondary | ICD-10-CM

## 2017-09-13 DIAGNOSIS — Z992 Dependence on renal dialysis: Secondary | ICD-10-CM

## 2017-09-13 DIAGNOSIS — Z8551 Personal history of malignant neoplasm of bladder: Secondary | ICD-10-CM

## 2017-09-13 DIAGNOSIS — F332 Major depressive disorder, recurrent severe without psychotic features: Secondary | ICD-10-CM | POA: Diagnosis present

## 2017-09-13 DIAGNOSIS — E1122 Type 2 diabetes mellitus with diabetic chronic kidney disease: Secondary | ICD-10-CM | POA: Diagnosis present

## 2017-09-13 HISTORY — DX: Patient's noncompliance with renal dialysis for other reason: Z91.158

## 2017-09-13 HISTORY — DX: Patient's noncompliance with renal dialysis: Z91.15

## 2017-09-13 LAB — BASIC METABOLIC PANEL
Anion gap: 10 (ref 5–15)
BUN: 92 mg/dL — ABNORMAL HIGH (ref 6–20)
CO2: 14 mmol/L — ABNORMAL LOW (ref 22–32)
Calcium: 9.2 mg/dL (ref 8.9–10.3)
Chloride: 114 mmol/L — ABNORMAL HIGH (ref 101–111)
Creatinine, Ser: 7.63 mg/dL — ABNORMAL HIGH (ref 0.61–1.24)
GFR, EST AFRICAN AMERICAN: 8 mL/min — AB (ref >60)
GFR, EST NON AFRICAN AMERICAN: 7 mL/min — AB (ref >60)
Glucose, Bld: 160 mg/dL — ABNORMAL HIGH (ref 65–99)
POTASSIUM: 6.5 mmol/L — AB (ref 3.5–5.1)
SODIUM: 138 mmol/L (ref 135–145)

## 2017-09-13 LAB — CBC
HCT: 39.9 % (ref 39.0–52.0)
Hemoglobin: 12.4 g/dL — ABNORMAL LOW (ref 13.0–17.0)
MCH: 29 pg (ref 26.0–34.0)
MCHC: 31.1 g/dL (ref 30.0–36.0)
MCV: 93.2 fL (ref 78.0–100.0)
PLATELETS: 269 10*3/uL (ref 150–400)
RBC: 4.28 MIL/uL (ref 4.22–5.81)
RDW: 15.2 % (ref 11.5–15.5)
WBC: 8.3 10*3/uL (ref 4.0–10.5)

## 2017-09-13 MED ORDER — INSULIN ASPART 100 UNIT/ML IV SOLN: 10.0000 [IU] | Freq: Once | INTRAVENOUS | Status: DC

## 2017-09-13 MED ORDER — CALCIUM GLUCONATE 10 % IV SOLN
1.0000 g | Freq: Once | INTRAVENOUS | Status: AC
  Administered 2017-09-14: 1 g via INTRAVENOUS
  Filled 2017-09-13: qty 10

## 2017-09-13 MED ORDER — INSULIN ASPART 100 UNIT/ML IV SOLN
10.0000 [IU] | Freq: Once | INTRAVENOUS | Status: AC
  Administered 2017-09-14: 10 [IU] via INTRAVENOUS

## 2017-09-13 MED ORDER — DEXTROSE 50 % IV SOLN
1.0000 | Freq: Once | INTRAVENOUS | Status: AC
  Administered 2017-09-14: 50 mL via INTRAVENOUS
  Filled 2017-09-13: qty 50

## 2017-09-13 MED ORDER — SODIUM BICARBONATE 8.4 % IV SOLN
50.0000 meq | Freq: Once | INTRAVENOUS | Status: AC
  Administered 2017-09-14: 50 meq via INTRAVENOUS
  Filled 2017-09-13: qty 50

## 2017-09-13 NOTE — ED Triage Notes (Addendum)
Pt states he hasn't had dialysis in 20 days. Pt states he was feeling fine and didn't need to go. Denies any symptoms. He has been receiving dialysis for one year at a center on [address], MWF. Pt went to center and they would not see him until he was evaluated at ED. Denies SOB/CP

## 2017-09-14 ENCOUNTER — Encounter (HOSPITAL_COMMUNITY): Payer: Self-pay | Admitting: Internal Medicine

## 2017-09-14 DIAGNOSIS — Z87891 Personal history of nicotine dependence: Secondary | ICD-10-CM | POA: Diagnosis not present

## 2017-09-14 DIAGNOSIS — Z992 Dependence on renal dialysis: Secondary | ICD-10-CM | POA: Diagnosis not present

## 2017-09-14 DIAGNOSIS — Z888 Allergy status to other drugs, medicaments and biological substances status: Secondary | ICD-10-CM | POA: Diagnosis not present

## 2017-09-14 DIAGNOSIS — F332 Major depressive disorder, recurrent severe without psychotic features: Secondary | ICD-10-CM | POA: Diagnosis present

## 2017-09-14 DIAGNOSIS — R9431 Abnormal electrocardiogram [ECG] [EKG]: Secondary | ICD-10-CM | POA: Diagnosis present

## 2017-09-14 DIAGNOSIS — Z79899 Other long term (current) drug therapy: Secondary | ICD-10-CM | POA: Diagnosis not present

## 2017-09-14 DIAGNOSIS — F419 Anxiety disorder, unspecified: Secondary | ICD-10-CM | POA: Diagnosis present

## 2017-09-14 DIAGNOSIS — E1122 Type 2 diabetes mellitus with diabetic chronic kidney disease: Secondary | ICD-10-CM | POA: Diagnosis present

## 2017-09-14 DIAGNOSIS — D631 Anemia in chronic kidney disease: Secondary | ICD-10-CM | POA: Diagnosis present

## 2017-09-14 DIAGNOSIS — E875 Hyperkalemia: Secondary | ICD-10-CM | POA: Diagnosis present

## 2017-09-14 DIAGNOSIS — K219 Gastro-esophageal reflux disease without esophagitis: Secondary | ICD-10-CM | POA: Diagnosis present

## 2017-09-14 DIAGNOSIS — Z9115 Patient's noncompliance with renal dialysis: Secondary | ICD-10-CM | POA: Diagnosis not present

## 2017-09-14 DIAGNOSIS — N186 End stage renal disease: Secondary | ICD-10-CM | POA: Diagnosis present

## 2017-09-14 DIAGNOSIS — Z8551 Personal history of malignant neoplasm of bladder: Secondary | ICD-10-CM | POA: Diagnosis not present

## 2017-09-14 DIAGNOSIS — I120 Hypertensive chronic kidney disease with stage 5 chronic kidney disease or end stage renal disease: Secondary | ICD-10-CM | POA: Diagnosis present

## 2017-09-14 DIAGNOSIS — E8889 Other specified metabolic disorders: Secondary | ICD-10-CM | POA: Diagnosis present

## 2017-09-14 LAB — COMPREHENSIVE METABOLIC PANEL
ALT: 14 U/L — AB (ref 17–63)
ANION GAP: 11 (ref 5–15)
AST: 12 U/L — ABNORMAL LOW (ref 15–41)
Albumin: 3.2 g/dL — ABNORMAL LOW (ref 3.5–5.0)
Alkaline Phosphatase: 48 U/L (ref 38–126)
BUN: 51 mg/dL — ABNORMAL HIGH (ref 6–20)
CALCIUM: 8.4 mg/dL — AB (ref 8.9–10.3)
CHLORIDE: 107 mmol/L (ref 101–111)
CO2: 22 mmol/L (ref 22–32)
CREATININE: 4.74 mg/dL — AB (ref 0.61–1.24)
GFR, EST AFRICAN AMERICAN: 14 mL/min — AB (ref >60)
GFR, EST NON AFRICAN AMERICAN: 12 mL/min — AB (ref >60)
Glucose, Bld: 123 mg/dL — ABNORMAL HIGH (ref 65–99)
Potassium: 4 mmol/L (ref 3.5–5.1)
Sodium: 140 mmol/L (ref 135–145)
Total Bilirubin: 0.5 mg/dL (ref 0.3–1.2)
Total Protein: 7.1 g/dL (ref 6.5–8.1)

## 2017-09-14 LAB — RENAL FUNCTION PANEL
Albumin: 3.5 g/dL (ref 3.5–5.0)
Anion gap: 12 (ref 5–15)
BUN: 92 mg/dL — AB (ref 6–20)
CHLORIDE: 113 mmol/L — AB (ref 101–111)
CO2: 10 mmol/L — AB (ref 22–32)
Calcium: 8.9 mg/dL (ref 8.9–10.3)
Creatinine, Ser: 7.2 mg/dL — ABNORMAL HIGH (ref 0.61–1.24)
GFR calc Af Amer: 9 mL/min — ABNORMAL LOW (ref >60)
GFR calc non Af Amer: 7 mL/min — ABNORMAL LOW (ref >60)
GLUCOSE: 211 mg/dL — AB (ref 65–99)
Phosphorus: 8.2 mg/dL — ABNORMAL HIGH (ref 2.5–4.6)
Potassium: 6.6 mmol/L (ref 3.5–5.1)
Sodium: 135 mmol/L (ref 135–145)

## 2017-09-14 LAB — CBC
HCT: 37.4 % — ABNORMAL LOW (ref 39.0–52.0)
Hemoglobin: 11.7 g/dL — ABNORMAL LOW (ref 13.0–17.0)
MCH: 28.8 pg (ref 26.0–34.0)
MCHC: 31.3 g/dL (ref 30.0–36.0)
MCV: 92.1 fL (ref 78.0–100.0)
Platelets: 234 10*3/uL (ref 150–400)
RBC: 4.06 MIL/uL — AB (ref 4.22–5.81)
RDW: 15.1 % (ref 11.5–15.5)
WBC: 7.5 10*3/uL (ref 4.0–10.5)

## 2017-09-14 LAB — CBG MONITORING, ED
Glucose-Capillary: 115 mg/dL — ABNORMAL HIGH (ref 65–99)
Glucose-Capillary: 90 mg/dL (ref 65–99)
Glucose-Capillary: 122 mg/dL — ABNORMAL HIGH (ref 65–99)

## 2017-09-14 LAB — HIV ANTIBODY (ROUTINE TESTING W REFLEX): HIV Screen 4th Generation wRfx: NONREACTIVE

## 2017-09-14 MED ORDER — CHLORHEXIDINE GLUCONATE CLOTH 2 % EX PADS: 6.0000 | MEDICATED_PAD | Freq: Every day | CUTANEOUS | Status: DC

## 2017-09-14 MED ORDER — INSULIN ASPART 100 UNIT/ML ~~LOC~~ SOLN: 0.0000 [IU] | Freq: Three times a day (TID) | SUBCUTANEOUS | Status: DC

## 2017-09-14 MED ORDER — PENTAFLUOROPROP-TETRAFLUOROETH EX AERO
1.0000 "application " | INHALATION_SPRAY | CUTANEOUS | Status: DC | PRN
  Filled 2017-09-14: qty 103.5

## 2017-09-14 MED ORDER — VITAMIN B-12 1000 MCG PO TABS
1000.0000 ug | ORAL_TABLET | Freq: Every day | ORAL | Status: DC
  Administered 2017-09-14: 1000 ug via ORAL
  Filled 2017-09-14 (×2): qty 1

## 2017-09-14 MED ORDER — PANTOPRAZOLE SODIUM 40 MG PO TBEC: 40.0000 mg | DELAYED_RELEASE_TABLET | Freq: Two times a day (BID) | ORAL | Status: DC

## 2017-09-14 MED ORDER — SODIUM POLYSTYRENE SULFONATE 15 GM/60ML PO SUSP
30.0000 g | Freq: Once | ORAL | Status: AC
  Administered 2017-09-14: 30 g via ORAL
  Filled 2017-09-14: qty 120

## 2017-09-14 MED ORDER — LIDOCAINE HCL (PF) 1 % IJ SOLN: 5.0000 mL | INTRAMUSCULAR | Status: DC | PRN

## 2017-09-14 MED ORDER — HEPARIN SODIUM (PORCINE) 1000 UNIT/ML DIALYSIS: 1000.0000 [IU] | INTRAMUSCULAR | Status: DC | PRN

## 2017-09-14 MED ORDER — QUETIAPINE FUMARATE 25 MG PO TABS
25.0000 mg | ORAL_TABLET | Freq: Every day | ORAL | Status: DC
Start: 2017-09-14 — End: 2017-09-14

## 2017-09-14 MED ORDER — SODIUM CHLORIDE 0.9 % IV SOLN: 100.0000 mL | INTRAVENOUS | Status: DC | PRN

## 2017-09-14 MED ORDER — ACETAMINOPHEN 650 MG RE SUPP: 650.0000 mg | Freq: Four times a day (QID) | RECTAL | Status: DC | PRN

## 2017-09-14 MED ORDER — VENLAFAXINE HCL ER 150 MG PO CP24
150.0000 mg | ORAL_CAPSULE | Freq: Every day | ORAL | Status: DC
  Filled 2017-09-14: qty 1

## 2017-09-14 MED ORDER — LIDOCAINE-PRILOCAINE 2.5-2.5 % EX CREA: 1.0000 "application " | TOPICAL_CREAM | CUTANEOUS | Status: DC | PRN

## 2017-09-14 MED ORDER — ACETAMINOPHEN 325 MG PO TABS: 650.0000 mg | ORAL_TABLET | Freq: Four times a day (QID) | ORAL | Status: DC | PRN

## 2017-09-14 MED ORDER — SENNOSIDES-DOCUSATE SODIUM 8.6-50 MG PO TABS: 1.0000 | ORAL_TABLET | Freq: Every evening | ORAL | Status: DC | PRN

## 2017-09-14 MED ORDER — INSULIN ASPART 100 UNIT/ML ~~LOC~~ SOLN
SUBCUTANEOUS | Status: AC
  Filled 2017-09-14: qty 1

## 2017-09-14 MED ORDER — INSULIN ASPART 100 UNIT/ML ~~LOC~~ SOLN: 0.0000 [IU] | Freq: Every day | SUBCUTANEOUS | Status: DC

## 2017-09-14 MED ORDER — SEVELAMER CARBONATE 800 MG PO TABS
800.0000 mg | ORAL_TABLET | Freq: Three times a day (TID) | ORAL | Status: DC
Start: 2017-09-14 — End: 2017-09-14
  Administered 2017-09-14: 800 mg via ORAL
  Filled 2017-09-14 (×3): qty 1

## 2017-09-14 MED ORDER — ALTEPLASE 2 MG IJ SOLR
2.0000 mg | Freq: Once | INTRAMUSCULAR | Status: DC | PRN
  Filled 2017-09-14: qty 2

## 2017-09-14 NOTE — Discharge Summary (Signed)
Physician Discharge Summary  [name] [name] TDV:[id number] DOB: 1961-02-20 DOA: 09/13/2017  PCP: Nickola Major, MD  Admit date: 09/13/2017 Discharge date: 09/14/2017  Time spent: 40 minutes  Recommendations for Outpatient Follow-up:   1. Compliance with dialysis 2. [employer]   Discharge Diagnoses:  Principal Problem:   Hyperkalemia Active Problems:   ESRD needing dialysis ([place])   Diabetes mellitus type 2, diet-controlled ([facility])   Non-compliance with renal dialysis ([place])   Abnormal EKG   Bladder cancer ([place])   Anemia of chronic disease   Major depressive disorder, recurrent severe without psychotic features ([place])   GERD (gastroesophageal reflux disease)   Discharge Condition: stable  Diet recommendation: renal diet  Filed Weights   09/13/17 1842 09/14/17 0310 09/14/17 0544  Weight: 92.5 kg (204 lb) 91.9 kg (202 lb 9.6 oz) 91 kg (200 lb 9.9 oz)    History of present illness:  [name] [name] is a 57 y.o. male with a Past Medical History of ESRD non-compliant with HD; HTN; DM; bladder cancer; and depression who presented with "needing dialysis".  He reported that he had been busy at work and feeling well and so he didn't go to HD for 21 days.  He tried to go to his center yesterday and they wouldn't accept him since he had been away so long.  He was frustrated by the fact that he may need serial dialysis and prefered to be discharged.  Exam unremarkable.  Hospital Course:  #1. End-stage renal disease on dialysis/hyperkalemia. He had dialysis prior to admission during the night. Potassium down to 4.0. Creatinine 4.7 UN 51. He remained asymptomatic. He was evaluated by nephrology who recommended behavioral contract for compliance with dialysis and no driving for work unless he is compliant with all dialysis sessions. He does not think he needs dialysis given that he makes urine. Of note nephrology also opines patient has a potentially unique situation with  the neobladder and recommended outpatient 24-hour urine test to investigate amount of residual renal function that he does have  #2. Diabetes. Diet controlled. Remain controlled during this hospitalization   Procedures:  dialysis  Consultations:  [facility]  Discharge Exam: Vitals:   09/14/17 1130 09/14/17 1149  BP: (!) 151/65   Pulse:    Resp: 14   Temp:    SpO2:  99%    General: Alert and oriented ambulating in room in no acute distress Cardiovascular: weight and rhythm no murmur gallop or rub Respiratory: no increased work of breathing breath sounds are clear bilaterally no wheeze  Discharge Instructions   Discharge Instructions    Diet - low sodium heart healthy   Complete by:  As directed    Increase activity slowly   Complete by:  As directed       Allergies  Allergen Reactions  . Lisinopril Cough      The results of significant diagnostics from this hospitalization (including imaging, microbiology, ancillary and laboratory) are listed below for reference.    Significant Diagnostic Studies: Dg Chest 2 View  Result Date: 09/13/2017 CLINICAL DATA:  The patient voluntarily discontinue dialysis, last dialysis 20 days ago EXAM: CHEST - 2 VIEW COMPARISON:  07/05/2016 FINDINGS: The lungs appear clear.  Cardiac and mediastinal contours normal. No pleural effusion identified. Mild thoracic spondylosis. Stable lower thoracic wedge compression fracture. IMPRESSION: 1.  No active cardiopulmonary disease is radiographically apparent. Electronically Signed   By: Van Clines M.D.   On: 09/13/2017 19:45   US Venous Img  Lower Unilateral Right  Result Date: 08/17/2017 CLINICAL DATA:  Right lower extremity pain. History of bladder cancer. Evaluate for DVT. EXAM: RIGHT LOWER EXTREMITY VENOUS DOPPLER ULTRASOUND TECHNIQUE: Gray-scale sonography with graded compression, as well as color Doppler and duplex ultrasound were performed to evaluate the lower extremity  deep venous systems from the level of the common femoral vein and including the common femoral, femoral, profunda femoral, popliteal and calf veins including the posterior tibial, peroneal and gastrocnemius veins when visible. The superficial great saphenous vein was also interrogated. Spectral Doppler was utilized to evaluate flow at rest and with distal augmentation maneuvers in the common femoral, femoral and popliteal veins. COMPARISON:  None. FINDINGS: Contralateral Common Femoral Vein: Respiratory phasicity is normal and symmetric with the symptomatic side. No evidence of thrombus. Normal compressibility. Common Femoral Vein: No evidence of thrombus. Normal compressibility, respiratory phasicity and response to augmentation. Saphenofemoral Junction: No evidence of thrombus. Normal compressibility and flow on color Doppler imaging. Profunda Femoral Vein: No evidence of thrombus. Normal compressibility and flow on color Doppler imaging. Femoral Vein: No evidence of thrombus. Normal compressibility, respiratory phasicity and response to augmentation. Popliteal Vein: No evidence of thrombus. Normal compressibility, respiratory phasicity and response to augmentation. Calf Veins: No evidence of thrombus. Normal compressibility and flow on color Doppler imaging. Superficial Great Saphenous Vein: No evidence of thrombus. Normal compressibility. Venous Reflux:  None. Other Findings: There is a minimal amount of subcutaneous edema at the level the right ankle. IMPRESSION: No evidence of DVT within the right lower extremity. Electronically Signed   By: Sandi Mariscal M.D.   On: 08/17/2017 15:24    Microbiology: No results found for this or any previous visit (from the past 240 hour(s)).   Labs: Basic Metabolic Panel: Recent Labs  Lab 09/13/17 1847 09/14/17 0057 09/14/17 0655  NA 138 135 140  K 6.5* 6.6* 4.0  CL 114* 113* 107  CO2 14* 10* 22  GLUCOSE 160* 211* 123*  BUN 92* 92* 51*  CREATININE 7.63* 7.20*  4.74*  CALCIUM 9.2 8.9 8.4*  PHOS  --  8.2*  --    Liver Function Tests: Recent Labs  Lab 09/14/17 0057 09/14/17 0655  AST  --  12*  ALT  --  14*  ALKPHOS  --  48  BILITOT  --  0.5  PROT  --  7.1  ALBUMIN 3.5 3.2*   No results for input(s): LIPASE, AMYLASE in the last 168 hours. No results for input(s): AMMONIA in the last 168 hours. CBC: Recent Labs  Lab 09/13/17 1847 09/14/17 0057  WBC 8.3 7.5  HGB 12.4* 11.7*  HCT 39.9 37.4*  MCV 93.2 92.1  PLT 269 234   Cardiac Enzymes: No results for input(s): CKTOTAL, CKMB, CKMBINDEX, TROPONINI in the last 168 hours. BNP: BNP (last 3 results) No results for input(s): BNP in the last 8760 hours.  ProBNP (last 3 results) No results for input(s): PROBNP in the last 8760 hours.  CBG: Recent Labs  Lab 09/14/17 0822 09/14/17 1158  GLUCAP 115* 90       Signed:  Radene Gunning MD.  [facility] 09/14/2017, 2:54 PM

## 2017-09-14 NOTE — ED Notes (Signed)
Renal/Carb Modified diet dinner tray ordered.

## 2017-09-14 NOTE — ED Provider Notes (Signed)
[facility] EMERGENCY DEPARTMENT Provider Note   CSN: [id number] Arrival date & time: 09/13/17  1800     History   Chief Complaint Chief Complaint  Patient presents with  . Vascular Access Problem    needs dialysis    HPI [name] [name] is a 57 y.o. male.  HPI 57 year old Caucasian male past medical history significant for CKD currently on hemodialysis, bladder cancer, diabetes, depression presents to the emergency department today needing dialysis.  Patient states that his last dialysis was 20 days ago.  He states he has been this long because he was "feeling fine and did not need to go".  Patient denies any symptoms.  States he went to dialysis today who would not see him because it has been so long and referred to the ED for evaluation.  Patient states that he sees [facility].  He again denies any symptoms at this time.  Pt denies any fever, chill, ha, vision changes, lightheadedness, dizziness, congestion, neck pain, cp, sob, cough, abd pain, n/v/d, urinary symptoms, change in bowel habits, melena, hematochezia, lower extremity paresthesias.  Past Medical History:  Diagnosis Date  . Acute renal injury ([place]) 11/2013  . Anemia   . Anxiety   . Bladder cancer ([place]) 2013  . Complication of anesthesia    agitation w/awakening in 9/13,OK 01/24/12  . Depression   . Diabetes mellitus type 2, diet-controlled ([place]) PT STOPPED TAKING METFORMIN--  HAS BEEN WATCHING DIET, EXERCISING AND LOSING WT  . Frequency of urination   . GERD (gastroesophageal reflux disease)   . Gout    recent flair -bilateral feet--  STABLE  . Hypertension   . Kidney failure   . Nocturia   . Urgency incontinence     Patient Active Problem List   Diagnosis Date Noted  . ESRD needing dialysis ([place]) 11/26/2016  . Vitamin B12 deficiency 11/19/2016  . GERD (gastroesophageal reflux disease) 11/16/2016  . Suicidal ideation 11/16/2016  . Pyelonephritis 10/20/2015  . Hydronephrosis  10/20/2015  . E-coli UTI   . Anemia of chronic disease   . Acute encephalopathy 10/19/2014  . Altered mental status 10/19/2014  . Faintness   . Syncope 07/06/2014  . Syncope and collapse 07/05/2014  . CKD (chronic kidney disease), stage IV ([place]) 07/05/2014  . Acute coccygeal pain 07/05/2014  . Occipital scalp laceration 07/05/2014  . Diabetes mellitus type 2, diet-controlled ([place]) 07/05/2014  . Protein calorie malnutrition ([place]) 11/29/2013  . AKI (acute kidney injury) ([place]) 10/30/2013  . Hypercalcemia 06/26/2013  . UTI (lower urinary tract infection) 06/25/2013  . Bladder cancer ([place]) 05/09/2013  . Major depressive disorder, recurrent severe without psychotic features ([place]) 09/03/2005    Past Surgical History:  Procedure Laterality Date  . AV FISTULA PLACEMENT Left 05/31/2014   Procedure: LEFT RADIOCEPHALIC ARTERIOVENOUS (AV) FISTULA CREATION ;  Surgeon: Mal Misty, MD;  Location: [facility];  Service: Vascular;  Laterality: Left;  . BACK SURGERY    . BLADDER SURGERY  04/26/2013   at the cancer treatment center of [place] in  [place]    . CYSTOSCOPY W/ RETROGRADES Bilateral 07/10/2012   Procedure: CYSTOSCOPY WITH RETROGRADE PYELOGRAM;  Surgeon: Molli Hazard, MD;  [facility];  Service: Urology;  Laterality: Bilateral;  . CYSTOSCOPY WITH URETHRAL DILATATION  01/24/2012   Procedure: CYSTOSCOPY WITH URETHRAL DILATATION;  Surgeon: Molli Hazard, MD;  Location: [facility];  Service: Urology;  Laterality: N/A;  . IR NEPHROSTOMY PLACEMENT LEFT  07/16/2016  .  LUMBAR LAMINECTOMY  06-02-2005   W/ RESECTION NERVE ROOT  L4  -  L5  . NEPHROSTOMY TUBE PLACEMENT ([facility] [facility])  2015   right   . TRANSURETHRAL RESECTION OF BLADDER TUMOR  12/15/2011   Procedure: TRANSURETHRAL RESECTION OF BLADDER TUMOR (TURBT);  Surgeon: Molli Hazard, MD;  Location: [facility];  Service: Urology;  Laterality: N/A;  2 HRS   . TRANSURETHRAL  RESECTION OF BLADDER TUMOR  01/24/2012   Procedure: TRANSURETHRAL RESECTION OF BLADDER TUMOR (TURBT);  Surgeon: Molli Hazard, MD;  Location: [facility];  Service: Urology;  Laterality: N/A;  90 MIN   . TRANSURETHRAL RESECTION OF BLADDER TUMOR N/A 07/10/2012   Procedure: TRANSURETHRAL RESECTION OF BLADDER TUMOR (TURBT);  Surgeon: Molli Hazard, MD;  Location: [facility];  Service: Urology;  Laterality: N/A;  . ulner artery repair Right 2009   rt -trauma /thrombectomy,repair        Home Medications    Prior to Admission medications   Medication Sig Start Date End Date Taking? Authorizing Provider  pantoprazole (PROTONIX) 40 MG tablet Take 40 mg by mouth 2 (two) times daily.  05/11/14   [provider]  QUEtiapine (SEROQUEL) 25 MG tablet Take 1 tablet (25 mg total) by mouth at bedtime. Patient not taking: Reported on 07/06/2017 07/06/17   Ursula Alert, MD  sevelamer carbonate (RENVELA) 800 MG tablet Take 1 tablet by mouth 3 (three) times daily. 08/11/16   [provider]  venlafaxine XR (EFFEXOR-XR) 150 MG 24 hr capsule Take 1 capsule (150 mg total) by mouth daily with breakfast. 07/06/17   Ursula Alert, MD  vitamin B-12 1000 MCG tablet Take 1 tablet (1,000 mcg total) by mouth daily. 11/19/16   Clovis Fredrickson, MD    Family History Family History  Problem Relation Age of Onset  . Diabetes Mellitus II Other   . Hypertension Other   . Bladder Cancer Neg Hx     Social History Social History   Tobacco Use  . Smoking status: Former Smoker    Packs/day: 1.00    Years: 16.00    Pack years: 16.00    Types: Cigarettes    Last attempt to quit: 01/20/1992    Years since quitting: 25.6  . Smokeless tobacco: Former Systems developer    Types: Chew    Quit date: 01/20/1992  Substance Use Topics  . Alcohol use: No    Alcohol/week: 0.0 oz  . Drug use: No     Allergies   Lisinopril   Review of Systems Review of Systems  All  other systems reviewed and are negative.    Physical Exam Updated Vital Signs BP (!) 166/80   Pulse 91   Temp 97.9 F (36.6 C) (Oral)   Resp 18   Ht 5\' 10"  (1.778 m)   Wt 92.5 kg (204 lb)   SpO2 100%   BMI 29.27 kg/m   Physical Exam  Constitutional: He is oriented to person, place, and time. He appears well-developed and well-nourished.  Non-toxic appearance. No distress.  HENT:  Head: Normocephalic and atraumatic.  Mouth/Throat: Oropharynx is clear and moist.  Eyes: Pupils are equal, round, and reactive to light. Conjunctivae are normal. Right eye exhibits no discharge. Left eye exhibits no discharge.  Neck: Normal range of motion. Neck supple.  Cardiovascular: Normal rate, regular rhythm, normal heart sounds and intact distal pulses. Exam reveals no gallop and no friction rub.  No murmur heard. Pulmonary/Chest: Effort normal and  breath sounds normal. No stridor. No respiratory distress. He has no wheezes. He has no rales. He exhibits no tenderness.  Abdominal: Soft. Bowel sounds are normal. He exhibits no distension. There is no tenderness. There is no rebound and no guarding.  Musculoskeletal: Normal range of motion. He exhibits no edema or tenderness.  Lymphadenopathy:    He has no cervical adenopathy.  Neurological: He is alert and oriented to person, place, and time.  Skin: Skin is warm and dry. Capillary refill takes less than 2 seconds. No rash noted.  Psychiatric: His behavior is normal. Judgment and thought content normal.  Nursing note and vitals reviewed.    ED Treatments / Results  Labs (all labs ordered are listed, but only abnormal results are displayed) Labs Reviewed  BASIC METABOLIC PANEL - Abnormal; Notable for the following components:      Result Value   Potassium 6.5 (*)    Chloride 114 (*)    CO2 14 (*)    Glucose, Bld 160 (*)    BUN 92 (*)    Creatinine, Ser 7.63 (*)    GFR calc non Af Amer 7 (*)    GFR calc Af Amer 8 (*)    All other  components within normal limits  CBC - Abnormal; Notable for the following components:   Hemoglobin 12.4 (*)    All other components within normal limits    EKG EKG Interpretation  Date/Time:  Tuesday September 13 2017 18:52:38 EDT Ventricular Rate:  89 PR Interval:  164 QRS Duration: 92 QT Interval:  336 QTC Calculation: 408 R Axis:   58 Text Interpretation:  Normal sinus rhythm Peaked t waves Confirmed by Dory Horn) on 09/13/2017 11:37:56 PM   Radiology Dg Chest 2 View  Result Date: 09/13/2017 CLINICAL DATA:  The patient voluntarily discontinue dialysis, last dialysis 20 days ago EXAM: CHEST - 2 VIEW COMPARISON:  07/05/2016 FINDINGS: The lungs appear clear.  Cardiac and mediastinal contours normal. No pleural effusion identified. Mild thoracic spondylosis. Stable lower thoracic wedge compression fracture. IMPRESSION: 1.  No active cardiopulmonary disease is radiographically apparent. Electronically Signed   By: Van Clines M.D.   On: 09/13/2017 19:45    Procedures .Critical Care Performed by: Doristine Devoid, PA-C Authorized by: Doristine Devoid, PA-C   Critical care provider statement:    Critical care time (minutes):  60   Critical care was necessary to treat or prevent imminent or life-threatening deterioration of the following conditions: Hyperkalemia with peaked T waves requiring calcium gluconate, insulin, glucose, bicarb, Kayexalate.   Critical care was time spent personally by me on the following activities:  Development of treatment plan with patient or surrogate, discussions with consultants, discussions with primary provider, evaluation of patient's response to treatment, examination of patient, obtaining history from patient or surrogate, ordering and performing treatments and interventions, ordering and review of laboratory studies, ordering and review of radiographic studies, pulse oximetry, re-evaluation of patient's condition and review of old  charts   (including critical care time)  Medications Ordered in ED Medications  insulin aspart (novoLOG) injection 10 Units (has no administration in time range)  sodium polystyrene (KAYEXALATE) 15 GM/60ML suspension 30 g (has no administration in time range)  dextrose 50 % solution 50 mL (50 mLs Intravenous Given 09/14/17 0022)  sodium bicarbonate injection 50 mEq (50 mEq Intravenous Given 09/14/17 0022)  calcium gluconate inj 10% (1 g) URGENT USE ONLY! (1 g Intravenous Given 09/14/17 0022)     Initial Impression /  Assessment and Plan / ED Course  I have reviewed the triage vital signs and the nursing notes.  Pertinent labs & imaging results that were available during my care of the patient were reviewed by me and considered in my medical decision making (see chart for details).     Patient presents to the ED for evaluation after missing 20 days of dialysis.  Patient denies any current symptoms.  Vital signs are reassuring.  Mild hypertension noted.  Patient afebrile.  On exam lungs clear to auscultation bilaterally.  No edema the lower extremity's.  Heart regular rate and rhythm.  No focal abdominal tenderness.  Neurovascularly intact in all extremities.  Patient alert oriented x3.  Potassium noted to be 6.5.  EKG shows peaked T waves.  Creatinine at baseline.  No other significant elect light derangement.  Given the peak T waves and elevated potassium will start calcium gluconate, insulin, glucose, bicarb.  I spoke with Dr. Hollie Salk with nephrology who has asked that I order Kayexalate.  She will see patient in the ED and place admission orders for admission.  Patient remains hemodynamic stable this time.  Patient updated on plan of care.  Patient was discussed and seen by my attending who is agreed with the above plan.  Will repeat EKG after treatment.  Final Clinical Impressions(s) / ED Diagnoses   Final diagnoses:  Hyperkalemia    ED Discharge Orders    None       Aaron Edelman 09/14/17 [place], April, MD 09/14/17 [phone]

## 2017-09-14 NOTE — ED Notes (Signed)
Ordered pt lunch tray 

## 2017-09-14 NOTE — Care Management (Signed)
This is a no charge note  Pending admission per Dr. Randal Buba  57 year old man with past medical history of ESRD-HD retention, diet-controlled diabetes, GERD, depression, anemia, bladder cancer, who missed dialysis for 20 days.  Found to have potassium 6.6, bicarbonate of 14, creatinine 7.63, BUN 92.  Patient denies chest pain or shortness of breath.  Renal was consulted for urgent dialysis.  Patient is admitted to telemetry bed as inpatient.     Ivor Costa, MD  [facility] Pager [phone]  If 7PM-7AM, please contact night-coverage [email or web address] Password [email or web address] 09/14/2017, 4:20 AM

## 2017-09-14 NOTE — H&P (Signed)
History and Physical    [name] [name] UYQ:[id number] DOB: 05/09/1960 DOA: 09/13/2017  PCP: [name] Major, MD Patient coming from:  home  Chief Complaint: needs dialysis  HPI: [name] [name] is a 57 y.o. male with medical history significant for end-stage renal disease on dialysis for the last year, bladder cancer status neobladder, diabetes, depression, suicidal ideation, persistent emergency department with the chief complaint of needing dialysis. Initial evaluation reveals potassium level 6.6 and abnormal EKG. [facility] are asked to admit  Information is obtained from the chart and the patient. Patient states his last dialysis session was 20 days ago. He states he has been noncompliant with dialysis treatment because "I was feeling okay". He did offer that he went to dialysis yesterday but they turned him down due to his noncompliance. Referred him to the emergency department for evaluation. He denies any headache dizziness syncope or near-syncope. He denies chest pain palpitations shortness of breath lower extremity edema. He states he does make urine denies any dysuria hematuria. He also denies any diarrhea constipation melena bright red blood per rectum. Denies sadness, suicide ideation.    ED Course: in the emergency department he's afebrile hemodynamically stable with mild tachycardia he is not hypoxic. He is provided with IV insulin, calcium gluconate bicarbonate and Kayexalate.  Review of Systems: As per HPI otherwise all other systems reviewed and are negative.   Ambulatory Status:ambulates independently and is independent with ADLs  Past Medical History:  Diagnosis Date  . Acute renal injury ([place]) 11/2013  . Anemia   . Anxiety   . Bladder cancer ([place]) 2013  . Complication of anesthesia    agitation w/awakening in 9/13,OK 01/24/12  . Depression   . Diabetes mellitus type 2, diet-controlled ([place]) PT STOPPED TAKING METFORMIN--  HAS BEEN WATCHING DIET,  EXERCISING AND LOSING WT  . Frequency of urination   . GERD (gastroesophageal reflux disease)   . Gout    recent flair -bilateral feet--  STABLE  . Hypertension   . Kidney failure   . Nocturia   . Non-compliance with renal dialysis ([place])   . Urgency incontinence     Past Surgical History:  Procedure Laterality Date  . AV FISTULA PLACEMENT Left 05/31/2014   Procedure: LEFT RADIOCEPHALIC ARTERIOVENOUS (AV) FISTULA CREATION ;  Surgeon: Mal Misty, MD;  Location: [facility];  Service: Vascular;  Laterality: Left;  . BACK SURGERY    . BLADDER SURGERY  04/26/2013   at the cancer treatment center of [place] in  [place]    . CYSTOSCOPY W/ RETROGRADES Bilateral 07/10/2012   Procedure: CYSTOSCOPY WITH RETROGRADE PYELOGRAM;  Surgeon: Molli Hazard, MD;  Location: [facility];  Service: Urology;  Laterality: Bilateral;  . CYSTOSCOPY WITH URETHRAL DILATATION  01/24/2012   Procedure: CYSTOSCOPY WITH URETHRAL DILATATION;  Surgeon: Molli Hazard, MD;  Location: [facility];  Service: Urology;  Laterality: N/A;  . IR NEPHROSTOMY PLACEMENT LEFT  07/16/2016  . LUMBAR LAMINECTOMY  06-02-2005   W/ RESECTION NERVE ROOT  L4  -  L5  . NEPHROSTOMY TUBE PLACEMENT ([place] HX)  2015   right   . TRANSURETHRAL RESECTION OF BLADDER TUMOR  12/15/2011   Procedure: TRANSURETHRAL RESECTION OF BLADDER TUMOR (TURBT);  Surgeon: Molli Hazard, MD;  Location[facility];  Service: Urology;  Laterality: N/A;  2 HRS   . TRANSURETHRAL RESECTION OF BLADDER TUMOR  01/24/2012   Procedure: TRANSURETHRAL RESECTION OF BLADDER TUMOR (TURBT);  Surgeon: Quillian Quince  Julieanne Cotton, MD;  Location: [facility];  Service: Urology;  Laterality: N/A;  90 MIN   . TRANSURETHRAL RESECTION OF BLADDER TUMOR N/A 07/10/2012   Procedure: TRANSURETHRAL RESECTION OF BLADDER TUMOR (TURBT);  Surgeon: Molli Hazard, MD;  Location: [facility];  Service:  Urology;  Laterality: N/A;  . ulner artery repair Right 2009   rt -trauma /thrombectomy,repair    Social History   Socioeconomic History  . Marital status: Married    Spouse name: [name] [name]  . Number of children: 3  . Years of education: Not on file  . Highest education level: Associate degree: occupational, Hotel manager, or vocational program  Occupational History    Comment: part time  Social Needs  . Financial resource strain: Not hard at all  . Food insecurity:    Worry: Never true    Inability: Never true  . Transportation needs:    Medical: No    Non-medical: No  Tobacco Use  . Smoking status: Former Smoker    Packs/day: 1.00    Years: 16.00    Pack years: 16.00    Types: Cigarettes    Last attempt to quit: 01/20/1992    Years since quitting: 25.6  . Smokeless tobacco: Former Systems developer    Types: [place] date: 01/20/1992  Substance and Sexual Activity  . Alcohol use: No    Alcohol/week: 0.0 oz  . Drug use: No  . Sexual activity: Not Currently  Lifestyle  . Physical activity:    Days per week: 0 days    Minutes per session: 0 min  . Stress: Not at all  Relationships  . Social connections:    Talks on phone: Not on file    Gets together: Not on file    Attends religious service: More than 4 times per year    Active member of club or organization: No    Attends meetings of clubs or organizations: Never    Relationship status: Married  . Intimate partner violence:    Fear of current or ex partner: No    Emotionally abused: No    Physically abused: No    Forced sexual activity: No  Other Topics Concern  . Not on file  Social History Narrative  . Not on file    Allergies  Allergen Reactions  . Lisinopril Cough    Family History  Problem Relation Age of Onset  . Diabetes Mellitus II Other   . Hypertension Other   . Bladder Cancer Neg Hx     Prior to Admission medications   Medication Sig Start Date End Date Taking? Authorizing Provider    pantoprazole (PROTONIX) 40 MG tablet Take 40 mg by mouth 2 (two) times daily.  05/11/14  Yes [provider]  sevelamer carbonate (RENVELA) 800 MG tablet Take 1 tablet by mouth 3 (three) times daily. 08/11/16  Yes [provider]  venlafaxine XR (EFFEXOR-XR) 150 MG 24 hr capsule Take 1 capsule (150 mg total) by mouth daily with breakfast. 07/06/17  Yes Eappen, Saramma, MD  QUEtiapine (SEROQUEL) 25 MG tablet Take 1 tablet (25 mg total) by mouth at bedtime. Patient not taking: Reported on 07/06/2017 07/06/17   Ursula Alert, MD  vitamin B-12 1000 MCG tablet Take 1 tablet (1,000 mcg total) by mouth daily. Patient not taking: Reported on 09/14/2017 11/19/16   Clovis Fredrickson, MD    Physical Exam: Vitals:   09/14/17 7846 09/14/17 0630 09/14/17 0636 09/14/17 0700  BP:  128/74 119/79  (!) 144/81  Pulse: (!) 114 (!) 115 95 (!) 102  Resp:   14 14  Temp:      TempSrc:      SpO2: 90% 100% 100% 99%  Weight:      Height:         General:  Appears calm and comfortable, texting on his cell phone in no acute distress Eyes:  PERRL, EOMI, normal lids, iris ENT:  grossly normal hearing, lips & tongue, ucous membranes of his mouth are pink slightly dry Neck:  no LAD, masses or thyromegaly Cardiovascular:  Tachycardia but regular, no m/r/g. No LE edema. Left arm AV fistuala Respiratory:  CTA bilaterally, no w/r/r. Normal respiratory effort. Abdomen:  soft, ntnd, positive bowel sounds throughout no guarding or rebounding Skin:  no rash or induration seen on limited exam Musculoskeletal:  grossly normal tone BUE/BLE, good ROM, no bony abnormality Psychiatric:  grossly normal mood and affect, speech fluent and appropriate, AOx3 Neurologic:  CN 2-12 grossly intact, moves all extremities in coordinated fashion, sensation intact speech clear facial symmetry  Labs on Admission: I have personally reviewed following labs and imaging studies  CBC: Recent Labs  Lab 09/13/17 1847 09/14/17 0057   WBC 8.3 7.5  HGB 12.4* 11.7*  HCT 39.9 37.4*  MCV 93.2 92.1  PLT 269 026   Basic Metabolic Panel: Recent Labs  Lab 09/13/17 1847 09/14/17 0057  NA 138 135  K 6.5* 6.6*  CL 114* 113*  CO2 14* 10*  GLUCOSE 160* 211*  BUN 92* 92*  CREATININE 7.63* 7.20*  CALCIUM 9.2 8.9  PHOS  --  8.2*   GFR: Estimated Creatinine Clearance: 12.7 mL/min (A) (by C-G formula based on SCr of 7.2 mg/dL (H)). Liver Function Tests: Recent Labs  Lab 09/14/17 0057  ALBUMIN 3.5   No results for input(s): LIPASE, AMYLASE in the last 168 hours. No results for input(s): AMMONIA in the last 168 hours. Coagulation Profile: No results for input(s): INR, PROTIME in the last 168 hours. Cardiac Enzymes: No results for input(s): CKTOTAL, CKMB, CKMBINDEX, TROPONINI in the last 168 hours. BNP (last 3 results) No results for input(s): PROBNP in the last 8760 hours. HbA1C: No results for input(s): HGBA1C in the last 72 hours. CBG: No results for input(s): GLUCAP in the last 168 hours. Lipid Profile: No results for input(s): CHOL, HDL, LDLCALC, TRIG, CHOLHDL, LDLDIRECT in the last 72 hours. Thyroid Function Tests: No results for input(s): TSH, T4TOTAL, FREET4, T3FREE, THYROIDAB in the last 72 hours. Anemia Panel: No results for input(s): VITAMINB12, FOLATE, FERRITIN, TIBC, IRON, RETICCTPCT in the last 72 hours. Urine analysis:    Component Value Date/Time   COLORURINE YELLOW 11/10/2016 0956   APPEARANCEUR TURBID (A) 11/10/2016 0956   LABSPEC 1.010 11/10/2016 0956   PHURINE 7.5 11/10/2016 0956   GLUCOSEU NEGATIVE 11/10/2016 0956   HGBUR LARGE (A) 11/10/2016 0956   BILIRUBINUR LARGE (A) 11/10/2016 0956   BILIRUBINUR small 08/29/2013 1836   KETONESUR NEGATIVE 11/10/2016 0956   PROTEINUR 30 (A) 11/10/2016 0956   UROBILINOGEN 1.0 10/19/2014 2045   NITRITE NEGATIVE 11/10/2016 0956   LEUKOCYTESUR LARGE (A) 11/10/2016 0956    Creatinine Clearance: Estimated Creatinine Clearance: 12.7 mL/min (A) (by  C-G formula based on SCr of 7.2 mg/dL (H)).  Sepsis Labs: @LABRCNTIP (procalcitonin:4,lacticidven:4) )No results found for this or any previous visit (from the past 240 hour(s)).   Radiological Exams on Admission: Dg Chest 2 View  Result Date: 09/13/2017 CLINICAL DATA:  The patient  voluntarily discontinue dialysis, last dialysis 20 days ago EXAM: CHEST - 2 VIEW COMPARISON:  07/05/2016 FINDINGS: The lungs appear clear.  Cardiac and mediastinal contours normal. No pleural effusion identified. Mild thoracic spondylosis. Stable lower thoracic wedge compression fracture. IMPRESSION: 1.  No active cardiopulmonary disease is radiographically apparent. Electronically Signed   By: Van Clines M.D.   On: 09/13/2017 19:45    EKG: Independently reviewed. Normal sinus rhythm Peaked t waves   Assessment/Plan Principal Problem:   Hyperkalemia Active Problems:   ESRD needing dialysis ([place])   Diabetes mellitus type 2, diet-controlled ([facility])   Non-compliance with renal dialysis ([place])   Abnormal EKG   Bladder cancer ([facility])   Anemia of chronic disease   [name] depressive disorder, recurrent severe without psychotic features ([place])   GERD (gastroesophageal reflux disease)   #1. Hyperkalemia with abnormal EKG. Related to noncompliance with dialysis. Potassium level 7.2. He received insulin and calcium gluconate Kayexalate in the emergency department. Also received emergency dialysis during the night. -Admit to telemetry -repeat cmet this am -appreciate nephrology assistance -monitor  #2. End-stage renal disease a Monday Wednesday Friday dialysis patient history of noncompliance. No dialysis for 20 days.evaluated by nephrology during the night who recommended emergent dialysis as well as repeat dialysis today and tomorrow. Note indicates that in his long absence from dialysis he will be treated essentially is a new start with lower BFR and shorter rx time -Appreciate nephrology's assistance  #3.  Diabetes. istory of non-insulin-dependent diabetes. Serum glucose 211 on admission. -Obtain a hemoglobin A1c -Sliding scale insulin for optimal control -Carb modified renal diet  #4. Noncompliance with renal dialysis. History of same. Also with a history of depression suicidal ideation. Query non-compliance with dialysis indirect suicide ideation. ephrology has educated patient to the risks last consequences of noncompliance. -Psych eval -Monitor  5. Anemia of chronic disease. Hemoglobin 11.7. -Monitor -meds per nephrology  #6. History of bladder cancer status post bladder.Reports he continues to make a little urine. -stable at baseline  #7. gerd -ppi   DVT prophylaxis: scd Code Status: full  Family Communication: none present  Disposition Plan: home   Consults called: [facility]  Admission status: inpatient    Radene Gunning MD [facility]  If 7PM-7AM, please contact night-coverage [email or web address] Password [email or web address]  09/14/2017, 8:20 AM

## 2017-09-14 NOTE — Progress Notes (Signed)
Patient had dialysis overnight, K+ down to 4.0, BN down to 51 and creat 4.74 this am post HD.  He is asymptomatic.  Have d/w Dr Lorrene Reid, pt's nephrologist at [facility].  Patient is welcome to resume dialysis at [facility] but under certain conditions:  1) He will be asked to sign a behavioral contract  2) We do not support his driving for work unless coming to all his dialysis sessions.    Pt thinks he may not need dialysis , still makes " a lot of urine".  He does have a potentially unique situation w/ a neobladder.  He was told that the dialysis unit can set him up for an outpatient 24 hr urine test to investigate the amount of residual renal function he has. In the meantime he should continue to attend all his dialysis sessions.   OK for dc.  Not currently uremic so pt will go to usual dialysis on Friday.    Kelly Splinter MD [employer] pgr [phone]   09/14/2017, 2:39 PM

## 2017-09-14 NOTE — Consult Note (Signed)
Reason for Consult: To manage dialysis and dialysis related needs Referring Physician: Dr. Hoy Morn Rogen [name] is an 57 y.o. male.  HPI: Pt is a 75M with ESRD on HD, h/o bladder cancer s/p cystectomy and neobladder creation, renal cancer s/p L nephrouretectomy, HTN who is now seen in consultation at the request of Dr. Randal [name] for management of ESRD and provision of HD.    Pt hasn't been to dialysis treatment since 08/31/2017.  He hasn't come because he "felt fine".  According to our OP clinic notes, he presented to the ED today because he was in danger of getting his license revoked due to lack of medical supervision (due to absences from treatment) of ESRD.    Pt has no complaints at this time.  He says he still urinates quite a bit.  Labs in ED obtained revealed K of 6.5, CO2 14, BUN 92, Cr 7.63, hgb 12.4.  EKG with NSR and peaked T waves.  CXR clear.    Dialyzes at [facility]  MWF 4 hours EDW 93.5 kg. HD Bath 2K 2 Ca bath F 180 dialyzer, BFR 400 Access L RC fistula No UF profile or Na modeling Heparin 5000 u bolus 2500 u mid-run mircera 100 mcg q 2 weeks venofer 50 q week Hectorol 8 mcg q rx   Past Medical History:  Diagnosis Date  . Acute renal injury ([place]) 11/2013  . Anemia   . Anxiety   . Bladder cancer ([place]) 2013  . Complication of anesthesia    agitation w/awakening in 9/13,OK 01/24/12  . Depression   . Diabetes mellitus type 2, diet-controlled ([place]) PT STOPPED TAKING METFORMIN--  HAS BEEN WATCHING DIET, EXERCISING AND LOSING WT  . Frequency of urination   . GERD (gastroesophageal reflux disease)   . Gout    recent flair -bilateral feet--  STABLE  . Hypertension   . Kidney failure   . Nocturia   . Urgency incontinence     Past Surgical History:  Procedure Laterality Date  . AV FISTULA PLACEMENT Left 05/31/2014   Procedure: LEFT RADIOCEPHALIC ARTERIOVENOUS (AV) FISTULA CREATION ;  Surgeon: Mal Misty, MD;  Location: [facility];  Service: Vascular;   Laterality: Left;  . BACK SURGERY    . BLADDER SURGERY  04/26/2013   at the cancer treatment center of [place] in  [place]    . CYSTOSCOPY W/ RETROGRADES Bilateral 07/10/2012   Procedure: CYSTOSCOPY WITH RETROGRADE PYELOGRAM;  Surgeon: Molli Hazard, MD;  [facility]  Service: Urology;  Laterality: Bilateral;  . CYSTOSCOPY WITH URETHRAL DILATATION  01/24/2012   Procedure: CYSTOSCOPY WITH URETHRAL DILATATION;  Surgeon: Molli Hazard, MD;  Location: [facility];  Service: Urology;  Laterality: N/A;  . IR NEPHROSTOMY PLACEMENT LEFT  07/16/2016  . LUMBAR LAMINECTOMY  06-02-2005   W/ RESECTION NERVE ROOT  L4  -  L5  . NEPHROSTOMY TUBE PLACEMENT ([place] HX)  2015   right   . TRANSURETHRAL RESECTION OF BLADDER TUMOR  12/15/2011   Procedure: TRANSURETHRAL RESECTION OF BLADDER TUMOR (TURBT);  Surgeon: Molli Hazard, MD;  Location: [facility];  Service: Urology;  Laterality: N/A;  2 HRS   . TRANSURETHRAL RESECTION OF BLADDER TUMOR  01/24/2012   Procedure: TRANSURETHRAL RESECTION OF BLADDER TUMOR (TURBT);  Surgeon: Molli Hazard, MD;  Location: [facility];  Service: Urology;  Laterality: N/A;  90 MIN   . TRANSURETHRAL RESECTION OF BLADDER TUMOR N/A 07/10/2012  Procedure: TRANSURETHRAL RESECTION OF BLADDER TUMOR (TURBT);  Surgeon: Molli Hazard, MD;  Location: [facility];  Service: Urology;  Laterality: N/A;  . ulner artery repair Right 2009   rt -trauma /thrombectomy,repair    Family History  Problem Relation Age of Onset  . Diabetes Mellitus II Other   . Hypertension Other   . Bladder Cancer Neg Hx     Social History:  reports that he quit smoking about 25 years ago. His smoking use included cigarettes. He has a 16.00 pack-year smoking history. He quit smokeless tobacco use about 25 years ago. His smokeless tobacco use included chew. He reports that he does not drink alcohol  or use drugs.  Allergies:  Allergies  Allergen Reactions  . Lisinopril Cough    Medications:  Scheduled: . Chlorhexidine Gluconate Cloth  6 each Topical Q0600  . insulin aspart  10 Units Intravenous Once   I have reviewed pt's current medications.  Results for orders placed or performed during the hospital encounter of 09/13/17 (from the past 48 hour(s))  Basic metabolic panel     Status: Abnormal   Collection Time: 09/13/17  6:47 PM  Result Value Ref Range   Sodium 138 135 - 145 mmol/L   Potassium 6.5 (HH) 3.5 - 5.1 mmol/L    Comment: NO VISIBLE HEMOLYSIS CRITICAL RESULT CALLED TO, READ BACK BY AND VERIFIED WITHRed Christians RN [phone] 1945 GREEN R    Chloride 114 (H) 101 - 111 mmol/L   CO2 14 (L) 22 - 32 mmol/L   Glucose, Bld 160 (H) 65 - 99 mg/dL   BUN 92 (H) 6 - 20 mg/dL   Creatinine, Ser 7.63 (H) 0.61 - 1.24 mg/dL   Calcium 9.2 8.9 - 10.3 mg/dL   GFR calc non Af Amer 7 (L) >60 mL/min   GFR calc Af Amer 8 (L) >60 mL/min    Comment: (NOTE) The eGFR has been calculated using the CKD EPI equation. This calculation has not been validated in all clinical situations. eGFR's persistently <60 mL/min signify possible Chronic Kidney Disease.    Anion gap 10 5 - 15    Comment: Performed at [facility] [address] [place], [place] [address]  CBC     Status: Abnormal   Collection Time: 09/13/17  6:47 PM  Result Value Ref Range   WBC 8.3 4.0 - 10.5 K/uL   RBC 4.28 4.22 - 5.81 MIL/uL   Hemoglobin 12.4 (L) 13.0 - 17.0 g/dL   HCT 39.9 39.0 - 52.0 %   MCV 93.2 78.0 - 100.0 fL   MCH 29.0 26.0 - 34.0 pg   MCHC 31.1 30.0 - 36.0 g/dL   RDW 15.2 11.5 - 15.5 %   Platelets 269 150 - 400 K/uL    Comment: Performed at [facility], [place] [address] [place],  [address]    Dg Chest 2 View  Result Date: 09/13/2017 CLINICAL DATA:  The patient voluntarily discontinue dialysis, last dialysis 20 days ago EXAM: CHEST - 2 VIEW COMPARISON:  07/05/2016 FINDINGS: The lungs  appear clear.  Cardiac and mediastinal contours normal. No pleural effusion identified. Mild thoracic spondylosis. Stable lower thoracic wedge compression fracture. IMPRESSION: 1.  No active cardiopulmonary disease is radiographically apparent. Electronically Signed   By: Van Clines M.D.   On: 09/13/2017 19:45    ROS: all other systems reviewed and are negative except as per HPI Blood pressure (!) 166/80, pulse 91, temperature 97.9 F (36.6  C), temperature source Oral, resp. rate 18, height '5\' 10"'$  (1.778 m), weight 92.5 kg (204 lb), SpO2 100 %. .  GEN NAD, sitting on bed HEENT EOMI, PERRL NECK prominent carotid pulses but no frank JVD PULM normal WOB clear bilaterally no c/w/r CV RRR no m/r/g ABD soft nontender nondistended NABS EXT no LE edema NEURO AAO x 3 a little fidgety but without asterixis ACCESS L RC fistula + T/B SKIN: ruddy/ sunburnt complexion  Assessment/Plan: 1 Hyperkalemia: noncompliance with dialysis.  Have discussed risks of nonadherence with pt.  He seems to have little insight.  Providing emergent dialysis tonight- since he's not been in so long will have to treat essentially as a new start with lower BFR and shorter rx time. 2 ESRD: MWF [place].  Re-initiating dialysis as above.  Appears to be a little below EDW.  Will likely need rx tomorrow and the next day too for re-initiation. 3 Hypertension: doesn't have any BP meds listed on his OP dialysis records.  BP a little elevated here.  Follow. 4. Anemia of ESRD: Hgb 12.4, no need for ESA now 5. Metabolic Bone Disease: will provide VDRA and binders as appropriate, has Auryxia 1 tab TID AC on OP records   [name] [name] 09/14/2017, 1:06 AM

## 2017-09-15 LAB — HEPATITIS B SURFACE ANTIGEN: HEP B S AG: NEGATIVE

## 2018-04-18 ENCOUNTER — Encounter: Payer: Self-pay | Admitting: Gastroenterology

## 2018-04-24 NOTE — Progress Notes (Signed)
New patient paperwork from [facility]. Nephrologist: Jamal Maes [phone]

## 2018-05-02 ENCOUNTER — Ambulatory Visit (INDEPENDENT_AMBULATORY_CARE_PROVIDER_SITE_OTHER): Payer: BC Managed Care – PPO | Admitting: Gastroenterology

## 2018-05-02 ENCOUNTER — Encounter: Payer: Self-pay | Admitting: Gastroenterology

## 2018-05-02 VITALS — BP 90/60 | HR 120 | Ht 67.75 in | Wt 207.2 lb

## 2018-05-02 DIAGNOSIS — K219 Gastro-esophageal reflux disease without esophagitis: Secondary | ICD-10-CM

## 2018-05-02 DIAGNOSIS — K625 Hemorrhage of anus and rectum: Secondary | ICD-10-CM

## 2018-05-02 DIAGNOSIS — N186 End stage renal disease: Secondary | ICD-10-CM | POA: Diagnosis not present

## 2018-05-02 DIAGNOSIS — Z992 Dependence on renal dialysis: Secondary | ICD-10-CM

## 2018-05-02 NOTE — Patient Instructions (Addendum)
If you are age 60 or older, your body mass index should be between 23-30. Your Body mass index is 31.75 kg/m. If this is out of the aforementioned range listed, please consider follow up with your Primary Care Provider.  If you are age 60 or younger, your body mass index should be between 19-25. Your Body mass index is 31.75 kg/m. If this is out of the aformentioned range listed, please consider follow up with your Primary Care Provider.   You have been scheduled for an endoscopy and colonoscopy. Please follow the written instructions given to you at your visit today. Please pick up your prep supplies at the pharmacy within the next 1-3 days. If you use inhalers (even only as needed), please bring them with you on the day of your procedure. Your physician has requested that you go to www.startemmi.com and enter the access code given to you at your visit today. This web site gives a general overview about your procedure. However, you should still follow specific instructions given to you by our office regarding your preparation for the procedure.  We are giving you a sample of Plenvu to use for your prep.  If you are not completed "cleaned out" after you finish the prep please drink 32 ounces of Gatorade (not red or purple) mixed with 119 grams of Miralax.   Thank you for entrusting me with your care and for choosing [facility], Dr. [place] Cellar

## 2018-05-02 NOTE — Progress Notes (Signed)
HPI :  58 year old male with a history of bladder cancer in remission, status post nephrectomy on dialysis, history of anemia, strip GERD, referred by Nickola Major, MD for rectal bleeding.  The patient receives dialysis 2 days a week. A few months ago he was given some heparin around the time of dialysis reports passing red blood in his stool. The heparin was stopped for several sessions and he had no current bleeding. He then was given another trial dose of heparin again developed rectal bleeding with a bowel movement. Since been stopped and his had no further bleeding. He has roughly 3-4 bowel movements per day. Stools tend to be dark due to chronic iron supplementation which he takes. He denies any NSAID use. He denies any straining or rectal discomfort. He is followed by [facility] has been told his bladder and renal cancer in remission. He denies any abdominal pains that bother him. He does have a history of long-standing reflux symptoms for which he's been on pantoprazole for over 5 years. He states this usually works pretty well although he's been having some periodic breakthrough more recently. He denies any dysphagia, denies any nausea or vomiting. He's never had an upper endoscopy. He did attempt at colonoscopy with Dr. Collene Mares a few years ago however was told he had a poor prep and the procedure was aborted. He did not follow-up for another try. He has no family history of colon cancer or upper tract malignancy    Past Medical History:  Diagnosis Date  . Anemia   . Anemia in chronic kidney disease (CKD) 08/12/2016  . Anxiety   . Bladder cancer ([place]) 2013  . Coagulation defect, unspecified ([place]) 08/26/2016  . Complication of anesthesia    agitation w/awakening in 9/13,OK 01/24/12  . Depression   . Diabetes mellitus type 2, diet-controlled ([place]) PT STOPPED TAKING METFORMIN--  HAS BEEN WATCHING DIET, EXERCISING AND LOSING WT  . ESRD (end stage renal disease) ([place]) 08/11/2016  .  Frequency of urination   . GERD (gastroesophageal reflux disease)   . Gout    recent flair -bilateral feet--  STABLE  . Hyperkalemia 08/12/2016  . Hypertension   . Hypertensive kidney disease with CKD (chronic kidney disease)   . Nocturia   . Non-compliance with renal dialysis ([place])   . Secondary hyperparathyroidism of renal origin ([place]) 08/12/2016  . Urgency incontinence      Past Surgical History:  Procedure Laterality Date  . AV FISTULA PLACEMENT Left 05/31/2014   Procedure: LEFT RADIOCEPHALIC ARTERIOVENOUS (AV) FISTULA CREATION ;  Surgeon: Mal Misty, MD;  Location: [facility];  Service: Vascular;  Laterality: Left;  . BACK SURGERY    . BLADDER SURGERY  04/26/2013   at the cancer treatment center of [place] in  [place]    . CYSTOSCOPY W/ RETROGRADES Bilateral 07/10/2012   Procedure: CYSTOSCOPY WITH RETROGRADE PYELOGRAM;  Surgeon: Molli Hazard, MD;  Location: [facility];  Service: Urology;  Laterality: Bilateral;  . CYSTOSCOPY WITH URETHRAL DILATATION  01/24/2012   Procedure: CYSTOSCOPY WITH URETHRAL DILATATION;  Surgeon: Molli Hazard, MD;  Location: [facility];  Service: Urology;  Laterality: N/A;  . IR NEPHROSTOMY PLACEMENT LEFT  07/16/2016  . LUMBAR LAMINECTOMY  06-02-2005   W/ RESECTION NERVE ROOT  L4  -  L5  . NEPHROSTOMY TUBE PLACEMENT ([place] [facility])  2015   right   . TRANSURETHRAL RESECTION OF BLADDER TUMOR  12/15/2011   Procedure: TRANSURETHRAL RESECTION OF BLADDER  TUMOR (TURBT);  Surgeon: Molli Hazard, MD;  Location: [facility];  Service: Urology;  Laterality: N/A;  2 HRS   . TRANSURETHRAL RESECTION OF BLADDER TUMOR  01/24/2012   Procedure: TRANSURETHRAL RESECTION OF BLADDER TUMOR (TURBT);  Surgeon: Molli Hazard, MD;  Location: [facility];  Service: Urology;  Laterality: N/A;  90 MIN   . TRANSURETHRAL RESECTION OF BLADDER TUMOR N/A 07/10/2012   Procedure: TRANSURETHRAL RESECTION OF  BLADDER TUMOR (TURBT);  Surgeon: Molli Hazard, MD;  Location: [facility];  Service: Urology;  Laterality: N/A;  . ulner artery repair Right 2009   rt -trauma /thrombectomy,repair   Family History  Problem Relation Age of Onset  . Heart disease Mother   . Hypertension Other   . Diabetes Maternal Grandmother   . Brain cancer Paternal Uncle   . Bladder Cancer Neg Hx    Social History   Tobacco Use  . Smoking status: Former Smoker    Packs/day: 1.00    Years: 16.00    Pack years: 16.00    Types: Cigarettes    Last attempt to quit: 01/20/1992    Years since quitting: 26.2  . Smokeless tobacco: Former Systems developer    Types: Chew    Quit date: 01/20/1992  Substance Use Topics  . Alcohol use: No    Alcohol/week: 0.0 standard drinks  . Drug use: No   Current Outpatient Medications  Medication Sig Dispense Refill  . AURYXIA 1 GM 210 MG(Fe) tablet TAKE 2 TABLETS BY MOUTH THREE TIMES A DAY WITH MEALS AND 1 TABLET TWICE A DAY WITH SNACKS.   SWALLOW WHOLE, DO NOT CHEW OR CRUSH MEDICATION    . cinacalcet (SENSIPAR) 30 MG tablet Take 30 mg by mouth daily.    Marland Kitchen levothyroxine (SYNTHROID, LEVOTHROID) 50 MCG tablet Take 50 mcg by mouth daily before breakfast.     . pantoprazole (PROTONIX) 40 MG tablet Take 40 mg by mouth 2 (two) times daily.   6  . QUEtiapine (SEROQUEL) 25 MG tablet Take 1 tablet (25 mg total) by mouth at bedtime. 30 tablet 1  . sevelamer carbonate (RENVELA) 800 MG tablet Take 1 tablet by mouth 3 (three) times daily.  1  . sodium bicarbonate 650 MG tablet Take 650 mg by mouth 2 (two) times daily.    Marland Kitchen venlafaxine XR (EFFEXOR-XR) 150 MG 24 hr capsule Take 1 capsule (150 mg total) by mouth daily with breakfast. 30 capsule 1  . vitamin B-12 1000 MCG tablet Take 1 tablet (1,000 mcg total) by mouth daily. 30 tablet 1   No current facility-administered medications for this visit.    Allergies  Allergen Reactions  . Lisinopril Cough     Review of Systems: All  systems reviewed and negative except where noted in HPI.   Lab Results  Component Value Date   WBC 7.5 09/14/2017   HGB 11.7 (L) 09/14/2017   HCT 37.4 (L) 09/14/2017   MCV 92.1 09/14/2017   PLT 234 09/14/2017    Lab Results  Component Value Date   CREATININE 4.74 (H) 09/14/2017   BUN 51 (H) 09/14/2017   NA 140 09/14/2017   K 4.0 09/14/2017   CL 107 09/14/2017   CO2 22 09/14/2017    Lab Results  Component Value Date   ALT 14 (L) 09/14/2017   AST 12 (L) 09/14/2017   ALKPHOS 48 09/14/2017   BILITOT 0.5 09/14/2017     Physical Exam: BP 90/60 (BP Location: Left  Arm, Patient Position: Sitting, Cuff Size: Normal)   Pulse (!) 120   Ht 5' 7.75" (1.721 m) Comment: height measured without shoes  Wt 207 lb 4 oz (94 kg)   BMI 31.75 kg/m  Constitutional: Pleasant,well-developed, male in no acute distress. HEENT: Normocephalic and atraumatic. Conjunctivae are normal. No scleral icterus. Neck supple.  Cardiovascular: Normal rate, regular rhythm.  Pulmonary/chest: Effort normal and breath sounds normal. No wheezing, rales or rhonchi. Abdominal: Soft, nondistended, nontender. There are no masses palpable. No hepatomegaly. Extremities: no edema, dialysis graft in left upper arm Lymphadenopathy: No cervical adenopathy noted. Neurological: Alert and oriented to person place and time. Skin: Skin is warm and dry. No rashes noted. Psychiatric: Normal mood and affect. Behavior is normal.   ASSESSMENT AND PLAN: 58 year old male here for new patient consultation regarding following:  Rectal bleeding - intermittent rectal bleeding, has occurred when been given heparin as part of his dialysis regimen. He has not had any bleeding while off the heparin. He otherwise endorses chronically dark stools from iron use but no other symptoms that bother him. He has never had a completed screening colonoscopy, and given his recurrent bleeding symptoms a colonoscopy is recommended. I discussed the risks  and benefits of colonoscopy and anesthesia with him and he wanted to proceed. Rectal exam was deferred today in light of him wanting to proceed with colonoscopy in the next few days. We gave him a bowel prep sample for his prep ([place]) and also counseled him on gatorade prep and to use additional prep if needed to ensure he is ready for this exam.   GERD - patient has long-standing reflux symptoms that have been controlled with Protonix, however having some breakthrough more frequently recently. Given his age, long-standing symptoms of reflux, ethnicity, gender, weight, he meets criteria for endoscopic screening for Barrett's esophagus. I discussed EGD with him and he wanted to proceed with that at the same time as colonoscopy. Further recommendations pending results  Lakeview Cellar, MD [facility]  CC: Nickola Major, MD

## 2018-05-04 ENCOUNTER — Ambulatory Visit (AMBULATORY_SURGERY_CENTER): Payer: BC Managed Care – PPO | Admitting: Gastroenterology

## 2018-05-04 ENCOUNTER — Encounter: Payer: Self-pay | Admitting: Gastroenterology

## 2018-05-04 VITALS — BP 173/81 | HR 99 | Temp 99.6°F | Resp 24 | Ht 67.75 in | Wt 207.0 lb

## 2018-05-04 DIAGNOSIS — D123 Benign neoplasm of transverse colon: Secondary | ICD-10-CM

## 2018-05-04 DIAGNOSIS — K219 Gastro-esophageal reflux disease without esophagitis: Secondary | ICD-10-CM | POA: Diagnosis not present

## 2018-05-04 DIAGNOSIS — D125 Benign neoplasm of sigmoid colon: Secondary | ICD-10-CM

## 2018-05-04 DIAGNOSIS — K625 Hemorrhage of anus and rectum: Secondary | ICD-10-CM

## 2018-05-04 DIAGNOSIS — K644 Residual hemorrhoidal skin tags: Secondary | ICD-10-CM

## 2018-05-04 DIAGNOSIS — D122 Benign neoplasm of ascending colon: Secondary | ICD-10-CM

## 2018-05-04 MED ORDER — SODIUM CHLORIDE 0.9 % IV SOLN: 500.0000 mL | Freq: Once | INTRAVENOUS | Status: DC

## 2018-05-04 NOTE — Op Note (Addendum)
[name] [name] Patient Name: [name] [name] Procedure Date: 05/04/2018 4:16 PM MRN: [id number] Endoscopist: Remo Lipps P. Havery [name] [name] , MD Age: 58 Referring MD:  Date of Birth: 11/13/1960 Gender: Male Account #: [id number] Procedure:                Upper GI endoscopy Indications:              Screening for Barrett's esophagus, history of                            reflux on protonix 40mg  BID, however states he is                            not taking it due to kidney issues, having                            worsening reflux recently Medicines:                Monitored Anesthesia Care Procedure:                Pre-Anesthesia Assessment:                           - Prior to the procedure, a History and Physical                            was performed, and patient medications and                            allergies were reviewed. The patient's tolerance of                            previous anesthesia was also reviewed. The risks                            and benefits of the procedure and the sedation                            options and risks were discussed with the patient.                            All questions were answered, and informed consent                            was obtained. Prior Anticoagulants: The patient has                            taken no previous anticoagulant or antiplatelet                            agents. ASA Grade Assessment: III - A patient with                            severe systemic disease. After reviewing the risks  and benefits, the patient was deemed in                            satisfactory condition to undergo the procedure.                           After obtaining informed consent, the endoscope was                            passed under direct vision. Throughout the                            procedure, the patient's blood pressure, pulse, and                            oxygen saturations were monitored  continuously. The                            Endoscope was introduced through the mouth, and                            advanced to the second part of duodenum. The upper                            GI endoscopy was accomplished without difficulty.                            The patient tolerated the procedure well. Scope In: Scope Out: Findings:                 Esophagogastric landmarks were identified: the                            Z-line was found at 39 cm, the gastroesophageal                            junction was found at 39 cm and the upper extent of                            the gastric folds was found at 39 cm from the                            incisors.                           LA Grade A (one or more mucosal breaks less than 5                            mm, not extending between tops of 2 mucosal folds)                            esophagitis was found 38 cm from the incisors.  The exam of the esophagus was otherwise normal.                           The entire examined stomach was normal.                           The duodenal bulb and second portion of the                            duodenum were normal. Complications:            No immediate complications. Estimated blood loss:                            Minimal. Estimated Blood Loss:     Estimated blood loss was minimal. Impression:               - Esophagogastric landmarks identified.                           - LA Grade A reflux esophagitis.                           - Normal esophagus otherwise - no Barrett's                           - Normal stomach.                           - Normal duodenal bulb and second portion of the                            duodenum. Recommendation:           - Patient has a contact number available for                            emergencies. The signs and symptoms of potential                            delayed complications were discussed with the                             patient. Return to normal activities tomorrow.                            Written discharge instructions were provided to the                            patient.                           - Resume previous diet.                           - Continue present medications.                           -  if your kidney doctor does not want you to take                            protonix, would start pepcid 20mg  twice daily                           - contact me if reflux symptoms persist despite                            pepcid [name] [name] P. [name] Dragone, MD 05/04/2018 5:14:54 PM This report has been signed electronically.

## 2018-05-04 NOTE — Op Note (Addendum)
[name]: [name] [name] Procedure Date: 05/04/2018 4:16 PM MRN: [id number] Endoscopist: Remo Lipps P. Havery [name] , MD Age: 58 Referring MD:  Date of Birth: [birth year] Gender: Male Account #: [id number] Procedure:                Colonoscopy Indications:              This is the patient's first colonoscopy, Rectal                            bleeding Medicines:                Monitored Anesthesia Care Procedure:                Pre-Anesthesia Assessment:                           - Prior to the procedure, a History and Physical                            was performed, and patient medications and                            allergies were reviewed. The patient's tolerance of                            previous anesthesia was also reviewed. The risks                            and benefits of the procedure and the sedation                            options and risks were discussed with the patient.                            All questions were answered, and informed consent                            was obtained. Prior Anticoagulants: The patient has                            taken no previous anticoagulant or antiplatelet                            agents. ASA Grade Assessment: III - A patient with                            severe systemic disease. After reviewing the risks                            and benefits, the patient was deemed in                            satisfactory condition to undergo the procedure.  After obtaining informed consent, the colonoscope                            was passed under direct vision. Throughout the                            procedure, the patient's blood pressure, pulse, and                            oxygen saturations were monitored continuously. The                            Colonoscope was introduced through the anus and                            advanced to the the terminal ileum, with                        identification of the appendiceal orifice and IC                            valve. The colonoscopy was performed without                            difficulty. The patient tolerated the procedure                            well. The quality of the bowel preparation was                            good. The terminal ileum, ileocecal valve,                            appendiceal orifice, and rectum were photographed. Scope In: 4:35:08 PM Scope Out: 5:01:39 PM Scope Withdrawal Time: 0 hours 23 minutes 42 seconds  Total Procedure Duration: 0 hours 26 minutes 31 seconds  Findings:                 Skin tags were found on perianal exam.                           The terminal ileum appeared normal.                           Four sessile polyps were found in the ascending                            colon. The polyps were 3 to 4 mm in size. These                            polyps were removed with a cold snare. Resection                            and retrieval were complete.  A 3 mm polyp was found in the hepatic flexure. The                            polyp was sessile. The polyp was removed with a                            cold snare. Resection and retrieval were complete.                           A 3 mm polyp was found in the transverse colon. The                            polyp was sessile. The polyp was removed with a                            cold snare. Resection and retrieval were complete.                           Four sessile polyps were found in the sigmoid                            colon. The polyps were 3 to 4 mm in size. These                            polyps were removed with a cold snare. Resection                            and retrieval were complete.                           Internal hemorrhoids were found during retroflexion.                           The exam was otherwise without abnormality. Complications:            No  immediate complications. Estimated blood loss:                            Minimal. Estimated Blood Loss:     Estimated blood loss was minimal. Impression:               - Perianal skin tags found on perianal exam.                           - The examined portion of the ileum was normal.                           - Four 3 to 4 mm polyps in the ascending colon,                            removed with a cold snare. Resected and retrieved.                           -  One 3 mm polyp at the hepatic flexure, removed                            with a cold snare. Resected and retrieved.                           - One 3 mm polyp in the transverse colon, removed                            with a cold snare. Resected and retrieved.                           - Four 3 to 4 mm polyps in the sigmoid colon,                            removed with a cold snare. Resected and retrieved.                           - Internal hemorrhoids.                           - The examination was otherwise normal.                           Suspect bleeding is coming from hemorrhoids. Recommendation:           - Patient has a contact number available for                            emergencies. The signs and symptoms of potential                            delayed complications were discussed with the                            patient. Return to normal activities tomorrow.                            Written discharge instructions were provided to the                            patient.                           - Resume previous diet.                           - Continue present medications.                           - Daily fiber supplement to treat hemorrhoids                           - Await pathology results. Remo Lipps P. Derin Matthes, MD 05/04/2018 5:08:17 PM This report has been signed electronically.

## 2018-05-04 NOTE — Progress Notes (Signed)
1722 Report given to PACU, vss

## 2018-05-04 NOTE — Progress Notes (Signed)
1701 Pt experienced a laryngeal spasm with sats down to med 50s.  Jaw thrust performed with O2 on 15L with O2 sat returning >90. Vss. Zofran  4mg  given IV, albuterol neb given, and labetol 10mg  Iv.

## 2018-05-04 NOTE — Progress Notes (Signed)
Called to room to assist during endoscopic procedure.  Patient ID and intended procedure confirmed with present staff. Received instructions for my participation in the procedure from the performing physician.  

## 2018-05-04 NOTE — Patient Instructions (Addendum)
10 polyps removed today. Internal hemorrhoids noted. Daily fiber supplements. Handouts given on polyps, hemorrhoids, esophagitis, and high fiber diet. Pepcid 20mg  by mouth twice daily **If kidney doctor does not want you taking Protonix  OK first meal: Eggs  Grits Toast Pancakes Lean meat Soup and sandwich Sip on warm fluids such as hot tea or coffee   YOU HAD AN ENDOSCOPIC PROCEDURE TODAY AT [facility]:   Refer to the procedure report that was given to you for any specific questions about what was found during the examination.  If the procedure report does not answer your questions, please call your gastroenterologist to clarify.  If you requested that your care partner not be given the details of your procedure findings, then the procedure report has been included in a sealed envelope for you to review at your convenience later.  YOU SHOULD EXPECT: Some feelings of bloating in the abdomen. Passage of more gas than usual.  Walking can help get rid of the air that was put into your GI tract during the procedure and reduce the bloating. If you had a lower endoscopy (such as a colonoscopy or flexible sigmoidoscopy) you may notice spotting of blood in your stool or on the toilet paper. If you underwent a bowel prep for your procedure, you may not have a normal bowel movement for a few days.  Please Note:  You might notice some irritation and congestion in your nose or some drainage.  This is from the oxygen used during your procedure.  There is no need for concern and it should clear up in a day or so.  SYMPTOMS TO REPORT IMMEDIATELY:   Following lower endoscopy (colonoscopy or flexible sigmoidoscopy):  Excessive amounts of blood in the stool  Significant tenderness or worsening of abdominal pains  Swelling of the abdomen that is new, acute  Fever of 100F or higher   Following upper endoscopy (EGD)  Vomiting of blood or coffee ground material  New chest pain or pain  under the shoulder blades  Painful or persistently difficult swallowing  New shortness of breath  Fever of 100F or higher  Black, tarry-looking stools  For urgent or emergent issues, a gastroenterologist can be reached at any hour by calling [phone].   DIET:  We do recommend a small meal at first, but then you may proceed to your regular diet.  Drink plenty of fluids but you should avoid alcoholic beverages for 24 hours.  ACTIVITY:  You should plan to take it easy for the rest of today and you should NOT DRIVE or use heavy machinery until tomorrow (because of the sedation medicines used during the test).    FOLLOW UP: Our staff will call the number listed on your records the next business day following your procedure to check on you and address any questions or concerns that you may have regarding the information given to you following your procedure. If we do not reach you, we will leave a message.  However, if you are feeling well and you are not experiencing any problems, there is no need to return our call.  We will assume that you have returned to your regular daily activities without incident.  If any biopsies were taken you will be contacted by phone or by letter within the next 1-3 weeks.  Please call us at [phone] if you have not heard about the biopsies in 3 weeks.    SIGNATURES/CONFIDENTIALITY: You and/or your care partner have signed  paperwork which will be entered into your electronic medical record.  These signatures attest to the fact that that the information above on your After Visit Summary has been reviewed and is understood.  Full responsibility of the confidentiality of this discharge information lies with you and/or your care-partner.

## 2018-05-05 ENCOUNTER — Telehealth: Payer: Self-pay

## 2018-05-05 NOTE — Telephone Encounter (Signed)
  Follow up Call-  Call back number 05/04/2018  Post procedure Call Back phone  # [phone]  Permission to leave phone message Yes  Some recent data might be hidden     Left message on machine

## 2018-05-05 NOTE — Telephone Encounter (Signed)
  Follow up Call-  Call back number 05/04/2018  Post procedure Call Back phone  # [phone]  Permission to leave phone message Yes  Some recent data might be hidden     Patient questions:  Do you have a fever, pain , or abdominal swelling? No. Pain Score  0 *  Have you tolerated food without any problems? Yes.    Have you been able to return to your normal activities? Yes.    Do you have any questions about your discharge instructions: Diet   No. Medications  No. Follow up visit  No.  Do you have questions or concerns about your Care? No.  Actions: * If pain score is 4 or above: No action needed, pain <4.

## 2018-05-05 NOTE — Telephone Encounter (Signed)
  Follow up Call-  Call back number 05/04/2018  Post procedure Call Back phone  # [phone]  Permission to leave phone message Yes  Some recent data might be hidden    Left message, try again a little later this afternoon.

## 2018-05-12 ENCOUNTER — Encounter: Payer: Self-pay | Admitting: Gastroenterology

## 2019-03-20 ENCOUNTER — Emergency Department (HOSPITAL_COMMUNITY)
Admission: EM | Admit: 2019-03-20 | Discharge: 2019-03-20 | Discharge status: Against Medical Advice | Payer: BC Managed Care – PPO | Attending: Emergency Medicine | Admitting: Emergency Medicine

## 2019-03-20 ENCOUNTER — Other Ambulatory Visit: Payer: Self-pay

## 2019-03-20 DIAGNOSIS — R7989 Other specified abnormal findings of blood chemistry: Secondary | ICD-10-CM | POA: Diagnosis present

## 2019-03-20 DIAGNOSIS — Z5321 Procedure and treatment not carried out due to patient leaving prior to being seen by health care provider: Secondary | ICD-10-CM | POA: Diagnosis not present

## 2019-03-20 LAB — CBC
HCT: 33.7 % — ABNORMAL LOW (ref 39.0–52.0)
Hemoglobin: 10.9 g/dL — ABNORMAL LOW (ref 13.0–17.0)
MCH: 31.1 pg (ref 26.0–34.0)
MCHC: 32.3 g/dL (ref 30.0–36.0)
MCV: 96.3 fL (ref 80.0–100.0)
Platelets: 208 10*3/uL (ref 150–400)
RBC: 3.5 MIL/uL — ABNORMAL LOW (ref 4.22–5.81)
RDW: 13.5 % (ref 11.5–15.5)
WBC: 6.1 10*3/uL (ref 4.0–10.5)
nRBC: 0 % (ref 0.0–0.2)

## 2019-03-20 LAB — BASIC METABOLIC PANEL
Anion gap: 9 (ref 5–15)
BUN: 91 mg/dL — ABNORMAL HIGH (ref 6–20)
CO2: 14 mmol/L — ABNORMAL LOW (ref 22–32)
Calcium: 8.5 mg/dL — ABNORMAL LOW (ref 8.9–10.3)
Chloride: 112 mmol/L — ABNORMAL HIGH (ref 98–111)
Creatinine, Ser: 6.31 mg/dL — ABNORMAL HIGH (ref 0.61–1.24)
GFR calc Af Amer: 10 mL/min — ABNORMAL LOW (ref >60)
GFR calc non Af Amer: 9 mL/min — ABNORMAL LOW (ref >60)
Glucose, Bld: 208 mg/dL — ABNORMAL HIGH (ref 70–99)
Potassium: 6.3 mmol/L (ref 3.5–5.1)
Sodium: 135 mmol/L (ref 135–145)

## 2019-03-20 NOTE — ED Triage Notes (Signed)
Pt states that he was called yesterday by [facility] to have his potassium checked.

## 2019-03-20 NOTE — ED Notes (Signed)
Date and time results received: 03/20/19 0830 (use smartphrase ".now" to insert current time)  Test: K Critical Value: 6.3  Name of Provider Notified: Dr. Melina Copa  Orders Received? Or Actions Taken?: working on finding pt a room

## 2019-05-27 ENCOUNTER — Other Ambulatory Visit: Payer: Self-pay

## 2019-05-27 ENCOUNTER — Emergency Department (HOSPITAL_COMMUNITY): Payer: BC Managed Care – PPO

## 2019-05-27 ENCOUNTER — Observation Stay (HOSPITAL_COMMUNITY)
Admission: EM | Admit: 2019-05-27 | Discharge: 2019-05-28 | Disposition: A | Discharge status: Home / Self Care | Payer: BC Managed Care – PPO | Attending: Internal Medicine | Admitting: Internal Medicine

## 2019-05-27 ENCOUNTER — Encounter (HOSPITAL_COMMUNITY): Payer: Self-pay | Admitting: Emergency Medicine

## 2019-05-27 DIAGNOSIS — Z87891 Personal history of nicotine dependence: Secondary | ICD-10-CM | POA: Insufficient documentation

## 2019-05-27 DIAGNOSIS — Z9115 Patient's noncompliance with renal dialysis: Secondary | ICD-10-CM | POA: Diagnosis not present

## 2019-05-27 DIAGNOSIS — F332 Major depressive disorder, recurrent severe without psychotic features: Secondary | ICD-10-CM | POA: Insufficient documentation

## 2019-05-27 DIAGNOSIS — R221 Localized swelling, mass and lump, neck: Secondary | ICD-10-CM | POA: Diagnosis present

## 2019-05-27 DIAGNOSIS — Z8249 Family history of ischemic heart disease and other diseases of the circulatory system: Secondary | ICD-10-CM | POA: Diagnosis not present

## 2019-05-27 DIAGNOSIS — I120 Hypertensive chronic kidney disease with stage 5 chronic kidney disease or end stage renal disease: Secondary | ICD-10-CM | POA: Insufficient documentation

## 2019-05-27 DIAGNOSIS — E46 Unspecified protein-calorie malnutrition: Secondary | ICD-10-CM | POA: Diagnosis not present

## 2019-05-27 DIAGNOSIS — Z20822 Contact with and (suspected) exposure to covid-19: Secondary | ICD-10-CM | POA: Insufficient documentation

## 2019-05-27 DIAGNOSIS — Z833 Family history of diabetes mellitus: Secondary | ICD-10-CM | POA: Insufficient documentation

## 2019-05-27 DIAGNOSIS — M109 Gout, unspecified: Secondary | ICD-10-CM | POA: Insufficient documentation

## 2019-05-27 DIAGNOSIS — E875 Hyperkalemia: Principal | ICD-10-CM | POA: Insufficient documentation

## 2019-05-27 DIAGNOSIS — Z992 Dependence on renal dialysis: Secondary | ICD-10-CM | POA: Diagnosis not present

## 2019-05-27 DIAGNOSIS — E039 Hypothyroidism, unspecified: Secondary | ICD-10-CM | POA: Insufficient documentation

## 2019-05-27 DIAGNOSIS — Z888 Allergy status to other drugs, medicaments and biological substances status: Secondary | ICD-10-CM | POA: Insufficient documentation

## 2019-05-27 DIAGNOSIS — Z808 Family history of malignant neoplasm of other organs or systems: Secondary | ICD-10-CM | POA: Insufficient documentation

## 2019-05-27 DIAGNOSIS — Z905 Acquired absence of kidney: Secondary | ICD-10-CM | POA: Insufficient documentation

## 2019-05-27 DIAGNOSIS — K122 Cellulitis and abscess of mouth: Secondary | ICD-10-CM | POA: Diagnosis not present

## 2019-05-27 DIAGNOSIS — D631 Anemia in chronic kidney disease: Secondary | ICD-10-CM | POA: Insufficient documentation

## 2019-05-27 DIAGNOSIS — E1122 Type 2 diabetes mellitus with diabetic chronic kidney disease: Secondary | ICD-10-CM | POA: Insufficient documentation

## 2019-05-27 DIAGNOSIS — F419 Anxiety disorder, unspecified: Secondary | ICD-10-CM | POA: Diagnosis not present

## 2019-05-27 DIAGNOSIS — Z794 Long term (current) use of insulin: Secondary | ICD-10-CM | POA: Insufficient documentation

## 2019-05-27 DIAGNOSIS — Z79899 Other long term (current) drug therapy: Secondary | ICD-10-CM | POA: Diagnosis not present

## 2019-05-27 DIAGNOSIS — G9340 Encephalopathy, unspecified: Secondary | ICD-10-CM | POA: Diagnosis not present

## 2019-05-27 DIAGNOSIS — Z8551 Personal history of malignant neoplasm of bladder: Secondary | ICD-10-CM | POA: Insufficient documentation

## 2019-05-27 DIAGNOSIS — K219 Gastro-esophageal reflux disease without esophagitis: Secondary | ICD-10-CM | POA: Diagnosis not present

## 2019-05-27 DIAGNOSIS — N186 End stage renal disease: Secondary | ICD-10-CM | POA: Diagnosis not present

## 2019-05-27 DIAGNOSIS — E785 Hyperlipidemia, unspecified: Secondary | ICD-10-CM | POA: Diagnosis not present

## 2019-05-27 DIAGNOSIS — N2581 Secondary hyperparathyroidism of renal origin: Secondary | ICD-10-CM | POA: Diagnosis not present

## 2019-05-27 LAB — POTASSIUM
Potassium: 5.4 mmol/L — ABNORMAL HIGH (ref 3.5–5.1)
Potassium: 5.5 mmol/L — ABNORMAL HIGH (ref 3.5–5.1)

## 2019-05-27 LAB — CBG MONITORING, ED
Glucose-Capillary: 304 mg/dL — ABNORMAL HIGH (ref 70–99)
Glucose-Capillary: 197 mg/dL — ABNORMAL HIGH (ref 70–99)

## 2019-05-27 LAB — CBC WITH DIFFERENTIAL/PLATELET
Abs Immature Granulocytes: 0.05 10*3/uL (ref 0.00–0.07)
Basophils Absolute: 0 10*3/uL (ref 0.0–0.1)
Basophils Relative: 0 %
Eosinophils Absolute: 0.2 10*3/uL (ref 0.0–0.5)
Eosinophils Relative: 3 %
HCT: 31.1 % — ABNORMAL LOW (ref 39.0–52.0)
Hemoglobin: 10.3 g/dL — ABNORMAL LOW (ref 13.0–17.0)
Immature Granulocytes: 1 %
Lymphocytes Relative: 24 %
Lymphs Abs: 1.3 10*3/uL (ref 0.7–4.0)
MCH: 29.5 pg (ref 26.0–34.0)
MCHC: 33.1 g/dL (ref 30.0–36.0)
MCV: 89.1 fL (ref 80.0–100.0)
Monocytes Absolute: 0.3 10*3/uL (ref 0.1–1.0)
Monocytes Relative: 5 %
Neutro Abs: 3.8 10*3/uL (ref 1.7–7.7)
Neutrophils Relative %: 67 %
Platelets: 166 10*3/uL (ref 150–400)
RBC: 3.49 MIL/uL — ABNORMAL LOW (ref 4.22–5.81)
RDW: 12.3 % (ref 11.5–15.5)
WBC: 5.5 10*3/uL (ref 4.0–10.5)
nRBC: 0 % (ref 0.0–0.2)

## 2019-05-27 LAB — BASIC METABOLIC PANEL
Anion gap: 10 (ref 5–15)
BUN: 60 mg/dL — ABNORMAL HIGH (ref 6–20)
CO2: 19 mmol/L — ABNORMAL LOW (ref 22–32)
Calcium: 8.4 mg/dL — ABNORMAL LOW (ref 8.9–10.3)
Chloride: 104 mmol/L (ref 98–111)
Creatinine, Ser: 6.33 mg/dL — ABNORMAL HIGH (ref 0.61–1.24)
GFR calc Af Amer: 10 mL/min — ABNORMAL LOW (ref >60)
GFR calc non Af Amer: 9 mL/min — ABNORMAL LOW (ref >60)
Glucose, Bld: 336 mg/dL — ABNORMAL HIGH (ref 70–99)
Potassium: 6.8 mmol/L (ref 3.5–5.1)
Sodium: 133 mmol/L — ABNORMAL LOW (ref 135–145)

## 2019-05-27 LAB — SARS CORONAVIRUS 2 (TAT 6-24 HRS): SARS Coronavirus 2: NEGATIVE

## 2019-05-27 LAB — POC SARS CORONAVIRUS 2 AG -  ED: SARS Coronavirus 2 Ag: NEGATIVE

## 2019-05-27 LAB — GLUCOSE, RANDOM: Glucose, Bld: 166 mg/dL — ABNORMAL HIGH (ref 70–99)

## 2019-05-27 MED ORDER — ACETAMINOPHEN 325 MG PO TABS: 650.0000 mg | ORAL_TABLET | Freq: Four times a day (QID) | ORAL | Status: DC | PRN

## 2019-05-27 MED ORDER — LEVOTHYROXINE SODIUM 50 MCG PO TABS
50.0000 ug | ORAL_TABLET | Freq: Every day | ORAL | Status: DC
  Administered 2019-05-28: 50 ug via ORAL
  Filled 2019-05-27: qty 1

## 2019-05-27 MED ORDER — FERRIC CITRATE 1 GM 210 MG(FE) PO TABS
420.0000 mg | ORAL_TABLET | Freq: Every day | ORAL | Status: DC
  Administered 2019-05-27 – 2019-05-28 (×2): 420 mg via ORAL
  Filled 2019-05-27 (×2): qty 2

## 2019-05-27 MED ORDER — CINACALCET HCL 30 MG PO TABS
30.0000 mg | ORAL_TABLET | Freq: Every day | ORAL | Status: DC
  Administered 2019-05-27 – 2019-05-28 (×2): 30 mg via ORAL
  Filled 2019-05-27 (×2): qty 1

## 2019-05-27 MED ORDER — PANTOPRAZOLE SODIUM 40 MG PO TBEC
80.0000 mg | DELAYED_RELEASE_TABLET | Freq: Every day | ORAL | Status: DC
  Administered 2019-05-27 – 2019-05-28 (×2): 80 mg via ORAL
  Filled 2019-05-27 (×2): qty 2

## 2019-05-27 MED ORDER — SODIUM BICARBONATE 650 MG PO TABS
650.0000 mg | ORAL_TABLET | Freq: Two times a day (BID) | ORAL | Status: DC
  Administered 2019-05-27 – 2019-05-28 (×2): 650 mg via ORAL
  Filled 2019-05-27 (×2): qty 1

## 2019-05-27 MED ORDER — ACETAMINOPHEN 650 MG RE SUPP: 650.0000 mg | Freq: Four times a day (QID) | RECTAL | Status: DC | PRN

## 2019-05-27 MED ORDER — DOXYCYCLINE HYCLATE 100 MG PO TABS
100.0000 mg | ORAL_TABLET | Freq: Two times a day (BID) | ORAL | Status: DC
  Administered 2019-05-28: 08:00:00 100 mg via ORAL
  Filled 2019-05-27: qty 1

## 2019-05-27 MED ORDER — AMLODIPINE BESYLATE 10 MG PO TABS
10.0000 mg | ORAL_TABLET | Freq: Every day | ORAL | Status: DC
  Administered 2019-05-27 – 2019-05-28 (×2): 10 mg via ORAL
  Filled 2019-05-27: qty 2
  Filled 2019-05-27: qty 1

## 2019-05-27 MED ORDER — ROSUVASTATIN CALCIUM 5 MG PO TABS
5.0000 mg | ORAL_TABLET | Freq: Every day | ORAL | Status: DC
  Administered 2019-05-27 – 2019-05-28 (×2): 5 mg via ORAL
  Filled 2019-05-27 (×2): qty 1

## 2019-05-27 MED ORDER — VENLAFAXINE HCL ER 150 MG PO CP24
150.0000 mg | ORAL_CAPSULE | Freq: Every day | ORAL | Status: DC
  Administered 2019-05-28: 08:00:00 150 mg via ORAL
  Filled 2019-05-27: qty 1

## 2019-05-27 MED ORDER — SEVELAMER CARBONATE 800 MG PO TABS
800.0000 mg | ORAL_TABLET | Freq: Three times a day (TID) | ORAL | Status: DC
  Administered 2019-05-28: 08:00:00 800 mg via ORAL
  Filled 2019-05-27 (×2): qty 1

## 2019-05-27 MED ORDER — CLINDAMYCIN HCL 150 MG PO CAPS: 300.0000 mg | ORAL_CAPSULE | Freq: Four times a day (QID) | ORAL | 0 refills | Status: DC

## 2019-05-27 MED ORDER — SODIUM ZIRCONIUM CYCLOSILICATE 10 G PO PACK
10.0000 g | PACK | Freq: Every day | ORAL | Status: DC
  Administered 2019-05-27 – 2019-05-28 (×2): 10 g via ORAL
  Filled 2019-05-27 (×2): qty 1

## 2019-05-27 MED ORDER — HEPARIN SODIUM (PORCINE) 5000 UNIT/ML IJ SOLN
5000.0000 [IU] | Freq: Three times a day (TID) | INTRAMUSCULAR | Status: DC
  Administered 2019-05-27 – 2019-05-28 (×2): 5000 [IU] via SUBCUTANEOUS
  Filled 2019-05-27 (×2): qty 1

## 2019-05-27 MED ORDER — CLINDAMYCIN HCL 150 MG PO CAPS
300.0000 mg | ORAL_CAPSULE | Freq: Once | ORAL | Status: AC
  Administered 2019-05-27: 300 mg via ORAL
  Filled 2019-05-27: qty 2

## 2019-05-27 MED ORDER — INSULIN ASPART 100 UNIT/ML IV SOLN
5.0000 [IU] | Freq: Once | INTRAVENOUS | Status: AC
  Administered 2019-05-27: 5 [IU] via INTRAVENOUS

## 2019-05-27 NOTE — ED Notes (Signed)
SARS Coronavirus 2 Ag NEGATIVE [Ref Range: NEGATIVE] ((NOTE)  SARS-CoV-2 antigen NOT DETECTED.  Reported to Mardene Celeste PA

## 2019-05-27 NOTE — ED Provider Notes (Signed)
[facility] EMERGENCY DEPARTMENT Provider Note   CSN: [id number] Arrival date & time: 05/27/19  7829     History Chief Complaint  Patient presents with  . neck swelling    [name] [name] is a 59 y.o. male.  Patient with bladder cancer status post cystectomy and left nephrectomy, end-stage renal disease on dialysis Tuesday and Thursday (missed previous session), recent CT of the chest abdomen pelvis for routine surveillance --presents to the emergency department with complaint of neck swelling.  Neck swelling began approximately 4 PM yesterday.  This gradually progressed.  Patient reports swelling and some redness on the bilateral neck.  Patient with history of diabetes but improved with weight loss.  Denies any fevers.  No significant discomfort with movement of the neck.  He denies signs of anaphylaxis including lip and tongue swelling, vomiting, diarrhea, lightheadedness or syncope.  No treatments prior to arrival.        Past Medical History:  Diagnosis Date  . Allergy   . Anemia   . Anemia in chronic kidney disease (CKD) 08/12/2016  . Anxiety   . Bladder cancer ([place]) 2013  . Blood transfusion without reported diagnosis   . Coagulation defect, unspecified ([place]) 08/26/2016  . Complication of anesthesia    agitation w/awakening in 9/13,OK 01/24/12  . Depression   . Diabetes mellitus type 2, diet-controlled ([place]) PT STOPPED TAKING METFORMIN--  HAS BEEN WATCHING DIET, EXERCISING AND LOSING WT  . ESRD (end stage renal disease) ([place]) 08/11/2016  . Frequency of urination   . GERD (gastroesophageal reflux disease)   . Gout    recent flair -bilateral feet--  STABLE  . Hyperkalemia 08/12/2016  . Hypertension   . Hypertensive kidney disease with CKD (chronic kidney disease)   . Nocturia   . Non-compliance with renal dialysis ([place])   . Secondary hyperparathyroidism of renal origin ([place]) 08/12/2016  . Urgency incontinence     Patient Active Problem List     Diagnosis Date Noted  . Hyperkalemia 09/14/2017  . Abnormal EKG 09/14/2017  . Non-compliance with renal dialysis ([place])   . ESRD needing dialysis ([place]) 11/26/2016  . Vitamin B12 deficiency 11/19/2016  . GERD (gastroesophageal reflux disease) 11/16/2016  . Suicidal ideation 11/16/2016  . Pyelonephritis 10/20/2015  . Hydronephrosis 10/20/2015  . E-coli UTI   . Anemia of chronic disease   . Acute encephalopathy 10/19/2014  . Altered mental status 10/19/2014  . Faintness   . Syncope 07/06/2014  . Syncope and collapse 07/05/2014  . CKD (chronic kidney disease), stage IV ([place]) 07/05/2014  . Acute coccygeal pain 07/05/2014  . Occipital scalp laceration 07/05/2014  . Diabetes mellitus type 2, diet-controlled ([place]) 07/05/2014  . Protein calorie malnutrition ([place]) 11/29/2013  . AKI (acute kidney injury) ([place]) 10/30/2013  . Hypercalcemia 06/26/2013  . UTI (lower urinary tract infection) 06/25/2013  . Bladder cancer ([place]) 05/09/2013  . Major depressive disorder, recurrent severe without psychotic features ([place]) 09/03/2005    Past Surgical History:  Procedure Laterality Date  . AV FISTULA PLACEMENT Left 05/31/2014   Procedure: LEFT RADIOCEPHALIC ARTERIOVENOUS (AV) FISTULA CREATION ;  Surgeon: Mal Misty, MD;  Location: [place];  Service: Vascular;  Laterality: Left;  . BACK SURGERY    . BLADDER SURGERY  04/26/2013   at the cancer treatment center of [place] in  [place]    . COLONOSCOPY    . CYSTOSCOPY W/ RETROGRADES Bilateral 07/10/2012   Procedure: CYSTOSCOPY WITH RETROGRADE PYELOGRAM;  Surgeon: Molli Hazard, MD;  Location: [place];  Service: Urology;  Laterality: Bilateral;  . CYSTOSCOPY WITH URETHRAL DILATATION  01/24/2012   Procedure: CYSTOSCOPY WITH URETHRAL DILATATION;  Surgeon: Molli Hazard, MD;  Location: [facility];  Service: Urology;  Laterality: N/A;  . IR NEPHROSTOMY PLACEMENT LEFT  07/16/2016  . LUMBAR LAMINECTOMY   06-02-2005   W/ RESECTION NERVE ROOT  L4  -  L5  . NEPHROSTOMY TUBE PLACEMENT ([place] HX)  2015   right   . TRANSURETHRAL RESECTION OF BLADDER TUMOR  12/15/2011   Procedure: TRANSURETHRAL RESECTION OF BLADDER TUMOR (TURBT);  Surgeon: Molli Hazard, MD;  Location: [facility];  Service: Urology;  Laterality: N/A;  2 HRS   . TRANSURETHRAL RESECTION OF BLADDER TUMOR  01/24/2012   Procedure: TRANSURETHRAL RESECTION OF BLADDER TUMOR (TURBT);  Surgeon: Molli Hazard, MD;  Location: [facility];  Service: Urology;  Laterality: N/A;  90 MIN   . TRANSURETHRAL RESECTION OF BLADDER TUMOR N/A 07/10/2012   Procedure: TRANSURETHRAL RESECTION OF BLADDER TUMOR (TURBT);  Surgeon: Molli Hazard, MD;  Location: [facility];  Service: Urology;  Laterality: N/A;  . ulner artery repair Right 2009   rt -trauma /thrombectomy,repair       Family History  Problem Relation Age of Onset  . Heart disease Mother   . Hypertension Other   . Diabetes Maternal Grandmother   . Brain cancer Paternal Uncle   . Bladder Cancer Neg Hx   . Colon cancer Neg Hx   . Esophageal cancer Neg Hx   . Rectal cancer Neg Hx   . Stomach cancer Neg Hx     Social History   Tobacco Use  . Smoking status: Former Smoker    Packs/day: 1.00    Years: 16.00    Pack years: 16.00    Types: Cigarettes    Quit date: 01/20/1992    Years since quitting: 27.3  . Smokeless tobacco: Former Systems developer    Types: Chew    Quit date: 01/20/1992  Substance Use Topics  . Alcohol use: No    Alcohol/week: 0.0 standard drinks  . Drug use: No    Home Medications Prior to Admission medications   Medication Sig Start Date End Date Taking? Authorizing Provider  AURYXIA 1 GM 210 MG(Fe) tablet TAKE 2 TABLETS BY MOUTH THREE TIMES A DAY WITH MEALS AND 1 TABLET TWICE A DAY WITH SNACKS.   SWALLOW WHOLE, DO NOT CHEW OR CRUSH MEDICATION 04/14/18   [provider]  cinacalcet (SENSIPAR) 30  MG tablet Take 30 mg by mouth daily.    [provider]  levothyroxine (SYNTHROID, LEVOTHROID) 50 MCG tablet Take 50 mcg by mouth daily before breakfast.  01/13/18 01/13/19  [provider]  pantoprazole (PROTONIX) 40 MG tablet Take 40 mg by mouth 2 (two) times daily.  05/11/14   [provider]  QUEtiapine (SEROQUEL) 25 MG tablet Take 1 tablet (25 mg total) by mouth at bedtime. Patient not taking: Reported on 05/04/2018 07/06/17   Ursula Alert, MD  sevelamer carbonate (RENVELA) 800 MG tablet Take 1 tablet by mouth 3 (three) times daily. 08/11/16   [provider]  sodium bicarbonate 650 MG tablet Take 650 mg by mouth 2 (two) times daily.    [provider]  venlafaxine XR (EFFEXOR-XR) 150 MG 24 hr capsule Take 1 capsule (150 mg total) by mouth daily with breakfast. 07/06/17   Ursula Alert, MD  vitamin B-12 1000 MCG tablet  Take 1 tablet (1,000 mcg total) by mouth daily. 11/19/16   Pucilowska, Wardell Honour, MD    Allergies    Lisinopril  Review of Systems   Review of Systems  Constitutional: Negative for fever.  HENT: Positive for facial swelling (neck). Negative for rhinorrhea, sore throat and trouble swallowing.   Eyes: Negative for redness.  Respiratory: Negative for cough, shortness of breath, wheezing and stridor.   Cardiovascular: Negative for chest pain.  Gastrointestinal: Negative for abdominal pain, diarrhea, nausea and vomiting.  Genitourinary: Negative for dysuria.  Musculoskeletal: Positive for neck pain and neck stiffness. Negative for myalgias.  Skin: Negative for rash.  Neurological: Negative for light-headedness and headaches.  Hematological: Positive for adenopathy.  Psychiatric/Behavioral: Negative for confusion.    Physical Exam Updated Vital Signs BP (!) 174/80   Pulse 91   Temp 98.3 F (36.8 C) (Oral)   Resp 17   Ht 5\' 10"  (1.778 m)   Wt 93 kg   SpO2 99%   BMI 29.41 kg/m   Physical Exam Vitals and nursing note  reviewed.  Constitutional:      Appearance: He is well-developed.  HENT:     Head: Normocephalic and atraumatic.     Mouth/Throat:     Pharynx: Oropharynx is clear.  Eyes:     General:        Right eye: No discharge.        Left eye: No discharge.     Conjunctiva/sclera: Conjunctivae normal.  Cardiovascular:     Rate and Rhythm: Normal rate and regular rhythm.     Heart sounds: Normal heart sounds.  Pulmonary:     Effort: Pulmonary effort is normal.     Breath sounds: Normal breath sounds.  Abdominal:     Palpations: Abdomen is soft.     Tenderness: There is no abdominal tenderness.  Musculoskeletal:     Cervical back: Normal range of motion and neck supple.  Lymphadenopathy:     Head:     Right side of head: Submandibular adenopathy present.     Left side of head: Submandibular adenopathy present.  Skin:    General: Skin is warm and dry.  Neurological:     Mental Status: He is alert.     ED Results / Procedures / Treatments   Labs (all labs ordered are listed, but only abnormal results are displayed) Labs Reviewed  CBC WITH DIFFERENTIAL/PLATELET - Abnormal; Notable for the following components:      Result Value   RBC 3.49 (*)    Hemoglobin 10.3 (*)    HCT 31.1 (*)    All other components within normal limits  BASIC METABOLIC PANEL - Abnormal; Notable for the following components:   Sodium 133 (*)    Potassium 6.8 (*)    CO2 19 (*)    Glucose, Bld 336 (*)    BUN 60 (*)    Creatinine, Ser 6.33 (*)    Calcium 8.4 (*)    GFR calc non Af Amer 9 (*)    GFR calc Af Amer 10 (*)    All other components within normal limits  GLUCOSE, RANDOM - Abnormal; Notable for the following components:   Glucose, Bld 166 (*)    All other components within normal limits  POTASSIUM - Abnormal; Notable for the following components:   Potassium 5.4 (*)    All other components within normal limits  CBG MONITORING, ED - Abnormal; Notable for the following components:    Glucose-Capillary 304 (*)  All other components within normal limits  CBG MONITORING, ED - Abnormal; Notable for the following components:   Glucose-Capillary 197 (*)    All other components within normal limits  POC SARS CORONAVIRUS 2 AG -  ED    EKG EKG Interpretation  Date/Time:  Sunday May 27 2019 09:47:23 EST Ventricular Rate:  99 PR Interval:    QRS Duration: 108 QT Interval:  349 QTC Calculation: 448 R Axis:   45 Text Interpretation: Sinus rhythm Confirmed by Madalyn Rob [phone]) on 05/27/2019 10:11:15 AM   Radiology CT Soft Tissue Neck Wo Contrast  Result Date: 05/27/2019 CLINICAL DATA:  Cellulitis with neck swelling for 1 week. Noncontrast study due to a end-stage renal disease. EXAM: CT NECK WITHOUT CONTRAST TECHNIQUE: Multidetector CT imaging of the neck was performed following the standard protocol without intravenous contrast. COMPARISON:  None. FINDINGS: Pharynx and larynx: There is no evidence of mass or swelling. High-density along the posterior tongue is attributed to ingested medication. Salivary glands: Prominent size of the low-density/fatty infiltrated submandibular glands. No stone or acute inflammation is seen. Thyroid: Normal Lymph nodes: None enlarged or abnormal density. Vascular: Mild atherosclerotic calcification. Limited intracranial: Negative Visualized orbits: Limited coverage is negative Mastoids and visualized paranasal sinuses: Negative Skeleton: No acute finding.  Lower cervical degenerative spurring. Upper chest: Negative Other: There is bilateral subcutaneous stranding and platysma thickening at the level of the submandibular spaces. The underlying submandibular glands do not appear primarily inflamed. IMPRESSION: Subcutaneous stranding and platysma thickening in the submandibular regions that correlates with history of cellulitis. Given the proximity to the submandibular glands sialadenitis is considered but there is no no definite primary  submandibular inflammation. Electronically Signed   By: Monte Fantasia M.D.   On: 05/27/2019 13:05    Procedures Procedures (including critical care time)  Medications Ordered in ED Medications  sodium zirconium cyclosilicate (LOKELMA) packet 10 g (10 g Oral Given 05/27/19 1134)  insulin aspart (novoLOG) injection 5 Units (5 Units Intravenous Given 05/27/19 1136)    ED Course  I have reviewed the triage vital signs and the nursing notes.  Pertinent labs & imaging results that were available during my care of the patient were reviewed by me and considered in my medical decision making (see chart for details).  Patient seen and examined. Work-up initiated. Medications ordered.   Vital signs reviewed and are as follows: BP (!) 174/80   Pulse 91   Temp 98.3 F (36.8 C) (Oral)   Resp 17   Ht 5\' 10"  (1.778 m)   Wt 93 kg   SpO2 99%   BMI 29.41 kg/m   D/w Dr. Roslynn Amble. Awaiting initial labs, will discuss scan with nephrology.   11:48 AM Nephrology requests avoidance of IV contrast. They will consult on patient.   I discussed imaging with radiology. OK with non-contrast CT of neck.   2:55 PM CT reviewed.  This shows findings of cellulitis.  No deep space abscess in the neck.  Will start on clindamycin.  Appreciate renal recommendations.  They plan to dialyze today given hyperkalemia.  Patient will follow up with dialysis after today's plan.  Encouraged patient to wrote follow-up with his PCP in the next 3 days for recheck, return to the emergency department with worsening pain, swelling in the neck, difficulty breathing or swallowing, fever.    MDM Rules/Calculators/A&P                      Patient with neck swelling.  No signs of anaphylaxis.  Doubt deep space infection.  No fevers.  Normal white count.  Suspect an element of superficial cellulitis.  Will start on antibiotics, have patient follow-up, return with worsening.   Final Clinical Impression(s) / ED Diagnoses Final  diagnoses:  Neck swelling  Hyperkalemia    Rx / DC Orders ED Discharge Orders    None       Carlisle Cater, PA-C 05/27/19 1457    Lucrezia Starch, MD 05/29/19 1213

## 2019-05-27 NOTE — H&P (Signed)
Date: 05/27/2019               Patient Name:  [name] [name] MRN: [id number]  DOB: Mar 03, 1961 Age / Sex: 59 y.o., male   PCP: Nickola Major, MD         Medical Service: Internal Medicine Teaching Service         Attending Physician: Dr. Elnora Morrison, MD    First Contact: Dr. Court Joy Pager: [phone]  Second Contact: Dr. Jerl Mina Pager: [phone]       After Hours (After 5p/  First Contact Pager: [phone]  weekends / holidays): Second Contact Pager: [phone]   Chief Complaint: Hyperkalemia   History of Present Illness: [name] [name] is a 59 year old male with past medical history of HTN, HLD, Hypothyroidism, MDD,  bladder cancer in remission,  S/p left nephrectomy , and end-stage renal disease on TTH HD who presented to the ED for evaluation of bilateral neck swelling and also found to be hyperkalemic to 6.8. CT Neck consistent with cellulitis and patient started on Clindamycin. Patient had normal WBC, no fever, and no airway compromise. Patient given Insulin and Lokelma. Repeat K 5.4 with plan for HD tonight. Admission for hyperkalemia in setting of ESRD. Last HD 2/16, patient missed 2/18.   Meds:   Current Meds  Medication Sig  . amLODipine (NORVASC) 10 MG tablet Take 10 mg by mouth daily.  Lorin Picket 1 GM 210 MG(Fe) tablet Take 420 mg by mouth daily.   . cinacalcet (SENSIPAR) 30 MG tablet Take 30 mg by mouth daily.  Marland Kitchen levothyroxine (SYNTHROID, LEVOTHROID) 50 MCG tablet Take 50 mcg by mouth daily before breakfast.   . pantoprazole (PROTONIX) 40 MG tablet Take 80 mg by mouth daily.   . rosuvastatin (CRESTOR) 5 MG tablet Take 5 mg by mouth daily.  . sevelamer carbonate (RENVELA) 800 MG tablet Take 800 mg by mouth 3 (three) times daily.   . sodium bicarbonate 650 MG tablet Take 650 mg by mouth 2 (two) times daily.  Marland Kitchen venlafaxine XR (EFFEXOR-XR) 150 MG 24 hr capsule Take 1 capsule (150 mg total) by mouth daily with breakfast.     Allergies: Allergies as of 05/27/2019  - Review Complete 05/27/2019  Allergen Reaction Noted  . Lisinopril Cough 05/30/2013   Past Medical History:  Diagnosis Date  . Allergy   . Anemia   . Anemia in chronic kidney disease (CKD) 08/12/2016  . Anxiety   . Bladder cancer ([place]) 2013  . Blood transfusion without reported diagnosis   . Coagulation defect, unspecified ([place]) 08/26/2016  . Complication of anesthesia    agitation w/awakening in 9/13,OK 01/24/12  . Depression   . Diabetes mellitus type 2, diet-controlled ([place]) PT STOPPED TAKING METFORMIN--  HAS BEEN WATCHING DIET, EXERCISING AND LOSING WT  . ESRD (end stage renal disease) ([place]) 08/11/2016  . Frequency of urination   . GERD (gastroesophageal reflux disease)   . Gout    recent flair -bilateral feet--  STABLE  . Hyperkalemia 08/12/2016  . Hypertension   . Hypertensive kidney disease with CKD (chronic kidney disease)   . Nocturia   . Non-compliance with renal dialysis ([place])   . Secondary hyperparathyroidism of renal origin ([place]) 08/12/2016  . Urgency incontinence     Family History:  Family History  Problem Relation Age of Onset  . Heart disease Mother   . Hypertension Other   . Diabetes Maternal Grandmother   . Brain cancer Paternal Uncle   . Bladder  Cancer Neg Hx   . Colon cancer Neg Hx   . Esophageal cancer Neg Hx   . Rectal cancer Neg Hx   . Stomach cancer Neg Hx      Social History:  Social History   Tobacco Use  . Smoking status: Former Smoker    Packs/day: 1.00    Years: 16.00    Pack years: 16.00    Types: Cigarettes    Quit date: 01/20/1992    Years since quitting: 27.3  . Smokeless tobacco: Former Systems developer    Types: Chew    Quit date: 01/20/1992  Substance Use Topics  . Alcohol use: No    Alcohol/week: 0.0 standard drinks  . Drug use: No     Review of Systems: A complete ROS was negative except as per HPI.   Physical Exam: Blood pressure (!) 186/90, pulse 89, temperature 98.3 F (36.8 C), temperature source Oral, resp. rate  15, height 5\' 10"  (1.778 m), weight 93 kg, SpO2 100 %.  .General: Alert, nl appearance HE: Normocephalic, atraumatic , EOMI, Conjunctivae normal ENT: No congestion, no rhinorrhea moist, bilateral submandibular adenopathy Cardiovascular: Normal rate, regular rhythm.  No murmurs, rubs, or gallops Pulmonary : Effort normal, breath sounds normal. No wheezes, rales, or rhonchi Abdominal: soft, nontender, no mass, bowel sounds normal Musculoskeletal: no swelling , deformity, injury ,or tenderness in extremities, Skin: Warm, dry , no bruising, erythema, or rash Neurological: alert and oriented x4 , o weakness, no sensory deficit  Psychiatric/Behavioral:  normal mood, normal behavior     EKG: personally reviewed my interpretation is sinus rhythm.   Assessment & Plan by Problem: Active Problems:   * No active hospital problems. *  [name] [name] is a 59 year old male with past medical history of HTN, HLD, Hypothyroidism, MDD,  bladder cancer in remission,  S/p left nephrectomy , and end-stage renal disease on TTH HD who presented to the ED for evaluation of bilateral neck swelling and also found to be hyperkalemic to 6.8.    #ESRD #Hyperkalemia End-stage renal disease on Tuesdays and Thursdays HD.  Patient missed his last Thursday and found to be hyperkalemic on evaluation of bilateral neck swelling. -K 5.4 with plans for HD tonight P: - HD tonight - AM BMP  #Bilateral neck cellulitis Sialadenitis was considered in ED, no evidence of stones on CT image.  Imaging consistent with cellulitis. No recent trauma to the area or history of bilateral swelling. Patient believes he was vaccinated as a child. No history of fever. Given Clindamycin, will switch to Doxycycline in setting of [place] [facility].  P: - Continue Doxycycline 100 mg BID x 7 days , stop date after one dose on 2/28  Dispo: Admit patient to Observation with expected length of stay less than 2 midnights.  Signed:  Tamsen Snider, MD PGY1

## 2019-05-27 NOTE — ED Triage Notes (Signed)
Pt reports having contrast dye Wednesday and noticed neck swelling last night. HD on Tue and Thur but did not go [place]. Pt took benadryl this morning with no relief.

## 2019-05-27 NOTE — Progress Notes (Addendum)
59 yo male with ESRD only Tue/Thurs HD  ([facility] ) with ho noncompliance  attending hd (missed Thursday) came to ER 2.2 Neck swelling bilat. Nofevers, chills , sob, cp , trouble swallowing . He had NO SIgns of ANAPHYLAXIS/   K 6.8 given Lokelma and iv meds  Then   CT neck =Subcutaneous stranding and platysma thickening in the submandibular regions that correlates with history of cellulitis. Given the proximity to the submandibular glands sialadenitis is considered but there is no no definite primary submandibular inflammation. PER ED MD = CT  shows findings of cellulitis.  No deep space abscess in the neck.  Will start on clindamycin. Plan for HD then ok for DC home per ER MD ,to fu with PCP in 3 days   Ernest Haber, PA-C [place] [phone] 05/27/2019,4:15 PM  LOS: 0 days   Labs: Basic Metabolic Panel: Recent Labs  Lab 05/27/19 1033 05/27/19 1350  NA 133*  --   K 6.8* 5.4*  CL 104  --   CO2 19*  --   GLUCOSE 336* 166*  BUN 60*  --   CREATININE 6.33*  --   CALCIUM 8.4*  --    Liver Function Tests: No results for input(s): AST, ALT, ALKPHOS, BILITOT, PROT, ALBUMIN in the last 168 hours. No results for input(s): LIPASE, AMYLASE in the last 168 hours. No results for input(s): AMMONIA in the last 168 hours. CBC: Recent Labs  Lab 05/27/19 1033  WBC 5.5  NEUTROABS 3.8  HGB 10.3*  HCT 31.1*  MCV 89.1  PLT 166   Cardiac Enzymes: No results for input(s): CKTOTAL, CKMB, CKMBINDEX, TROPONINI in the last 168 hours. CBG: Recent Labs  Lab 05/27/19 1130 05/27/19 1246  GLUCAP 304* 197*    Studies/Results: CT Soft Tissue Neck Wo Contrast  Result Date: 05/27/2019 CLINICAL DATA:  Cellulitis with neck swelling for 1 week. Noncontrast study due to a end-stage renal disease. EXAM: CT NECK WITHOUT CONTRAST TECHNIQUE: Multidetector CT imaging of the neck was performed following the standard protocol without intravenous contrast. COMPARISON:  None. FINDINGS:  Pharynx and larynx: There is no evidence of mass or swelling. High-density along the posterior tongue is attributed to ingested medication. Salivary glands: Prominent size of the low-density/fatty infiltrated submandibular glands. No stone or acute inflammation is seen. Thyroid: Normal Lymph nodes: None enlarged or abnormal density. Vascular: Mild atherosclerotic calcification. Limited intracranial: Negative Visualized orbits: Limited coverage is negative Mastoids and visualized paranasal sinuses: Negative Skeleton: No acute finding.  Lower cervical degenerative spurring. Upper chest: Negative Other: There is bilateral subcutaneous stranding and platysma thickening at the level of the submandibular spaces. The underlying submandibular glands do not appear primarily inflamed. IMPRESSION: Subcutaneous stranding and platysma thickening in the submandibular regions that correlates with history of cellulitis. Given the proximity to the submandibular glands sialadenitis is considered but there is no no definite primary submandibular inflammation. Electronically Signed   By: Monte Fantasia M.D.   On: 05/27/2019 13:05   Medications:  . sodium zirconium cyclosilicate  10 g Oral Daily    I have seen and examined this patient and agree with plan and assessment in the above note with renal recommendations/intervention highlighted.  Discussed the risks of noncompliance with HD including hospitalization and/or sudden death. [place] John A Rebecca Motta,MD June 09, 2019 2:02 PM

## 2019-05-27 NOTE — ED Provider Notes (Signed)
Medical Decision Making: Care of patient assumed from [facility] PA-C at 1500.  Agree with history, physical exam and plan.  See their note for further details.  Briefly, 59 y.o. male with PMH/PSH as below.  Past Medical History:  Diagnosis Date  . Allergy   . Anemia   . Anemia in chronic kidney disease (CKD) 08/12/2016  . Anxiety   . Bladder cancer ([place]) 2013  . Blood transfusion without reported diagnosis   . Coagulation defect, unspecified ([place]) 08/26/2016  . Complication of anesthesia    agitation w/awakening in 9/13,OK 01/24/12  . Depression   . Diabetes mellitus type 2, diet-controlled ([place]) PT STOPPED TAKING METFORMIN--  HAS BEEN WATCHING DIET, EXERCISING AND LOSING WT  . ESRD (end stage renal disease) ([place]) 08/11/2016  . Frequency of urination   . GERD (gastroesophageal reflux disease)   . Gout    recent flair -bilateral feet--  STABLE  . Hyperkalemia 08/12/2016  . Hypertension   . Hypertensive kidney disease with CKD (chronic kidney disease)   . Nocturia   . Non-compliance with renal dialysis ([place])   . Secondary hyperparathyroidism of renal origin ([place]) 08/12/2016  . Urgency incontinence    Past Surgical History:  Procedure Laterality Date  . AV FISTULA PLACEMENT Left 05/31/2014   Procedure: LEFT RADIOCEPHALIC ARTERIOVENOUS (AV) FISTULA CREATION ;  Surgeon: Mal Misty, MD;  Location: [place];  Service: Vascular;  Laterality: Left;  . BACK SURGERY    . BLADDER SURGERY  04/26/2013   at the cancer treatment center of [place] in  [place]    . COLONOSCOPY    . CYSTOSCOPY W/ RETROGRADES Bilateral 07/10/2012   Procedure: CYSTOSCOPY WITH RETROGRADE PYELOGRAM;  Surgeon: Molli Hazard, MD;  [facility];  Service: Urology;  Laterality: Bilateral;  . CYSTOSCOPY WITH URETHRAL DILATATION  01/24/2012   Procedure: CYSTOSCOPY WITH URETHRAL DILATATION;  Surgeon: Molli Hazard, MD;  Location: [facility];  Service: Urology;   Laterality: N/A;  . IR NEPHROSTOMY PLACEMENT LEFT  07/16/2016  . LUMBAR LAMINECTOMY  06-02-2005   W/ RESECTION NERVE ROOT  L4  -  L5  . NEPHROSTOMY TUBE PLACEMENT ([place] [facility])  2015   right   . TRANSURETHRAL RESECTION OF BLADDER TUMOR  12/15/2011   Procedure: TRANSURETHRAL RESECTION OF BLADDER TUMOR (TURBT);  Surgeon: Molli Hazard, MD;  Location: [facility];  Service: Urology;  Laterality: N/A;  2 HRS   . TRANSURETHRAL RESECTION OF BLADDER TUMOR  01/24/2012   Procedure: TRANSURETHRAL RESECTION OF BLADDER TUMOR (TURBT);  Surgeon: Molli Hazard, MD;  Location: [facility];  Service: Urology;  Laterality: N/A;  90 MIN   . TRANSURETHRAL RESECTION OF BLADDER TUMOR N/A 07/10/2012   Procedure: TRANSURETHRAL RESECTION OF BLADDER TUMOR (TURBT);  Surgeon: Molli Hazard, MD;  Location: [facility];  Service: Urology;  Laterality: N/A;  . ulner artery repair Right 2009   rt -trauma /thrombectomy,repair     Patient HDS at handoff.    Plan at time of handoff: Here for neck swelling, found to have hyperkalemia, nephrology consulted for emergent Dialysis, then likely home.  ED Course Informed by nephrology that the patient will not be able to be dialyzed until 11 PM at the earliest.  [facility] internal medicine for observation admission.  Admitted to medicine in stable condition.  Clinical Impression 1. Neck swelling   2. Hyperkalemia     Patient care provided under supervision of my attending, Dr.  Zavitz.    Yuma Pacella, [place], MD 05/28/19 0100    Elnora Morrison, MD 05/28/19 [phone]

## 2019-05-27 NOTE — ED Notes (Signed)
HD stated pt would not be taken for HD until 10-11 tonight. MD informed.

## 2019-05-28 DIAGNOSIS — N186 End stage renal disease: Secondary | ICD-10-CM

## 2019-05-28 DIAGNOSIS — C679 Malignant neoplasm of bladder, unspecified: Secondary | ICD-10-CM

## 2019-05-28 DIAGNOSIS — L03221 Cellulitis of neck: Secondary | ICD-10-CM | POA: Diagnosis not present

## 2019-05-28 DIAGNOSIS — E875 Hyperkalemia: Secondary | ICD-10-CM | POA: Diagnosis not present

## 2019-05-28 DIAGNOSIS — Z888 Allergy status to other drugs, medicaments and biological substances status: Secondary | ICD-10-CM

## 2019-05-28 DIAGNOSIS — E039 Hypothyroidism, unspecified: Secondary | ICD-10-CM

## 2019-05-28 DIAGNOSIS — E785 Hyperlipidemia, unspecified: Secondary | ICD-10-CM

## 2019-05-28 DIAGNOSIS — I120 Hypertensive chronic kidney disease with stage 5 chronic kidney disease or end stage renal disease: Secondary | ICD-10-CM

## 2019-05-28 DIAGNOSIS — Z992 Dependence on renal dialysis: Secondary | ICD-10-CM

## 2019-05-28 LAB — CBC
HCT: 32.8 % — ABNORMAL LOW (ref 39.0–52.0)
Hemoglobin: 10.9 g/dL — ABNORMAL LOW (ref 13.0–17.0)
MCH: 29.3 pg (ref 26.0–34.0)
MCHC: 33.2 g/dL (ref 30.0–36.0)
MCV: 88.2 fL (ref 80.0–100.0)
Platelets: 178 10*3/uL (ref 150–400)
RBC: 3.72 MIL/uL — ABNORMAL LOW (ref 4.22–5.81)
RDW: 12.6 % (ref 11.5–15.5)
WBC: 7.8 10*3/uL (ref 4.0–10.5)
nRBC: 0 % (ref 0.0–0.2)

## 2019-05-28 LAB — RENAL FUNCTION PANEL
Albumin: 3.7 g/dL (ref 3.5–5.0)
Anion gap: 15 (ref 5–15)
BUN: 38 mg/dL — ABNORMAL HIGH (ref 6–20)
CO2: 24 mmol/L (ref 22–32)
Calcium: 8.4 mg/dL — ABNORMAL LOW (ref 8.9–10.3)
Chloride: 96 mmol/L — ABNORMAL LOW (ref 98–111)
Creatinine, Ser: 4.53 mg/dL — ABNORMAL HIGH (ref 0.61–1.24)
GFR calc Af Amer: 15 mL/min — ABNORMAL LOW (ref >60)
GFR calc non Af Amer: 13 mL/min — ABNORMAL LOW (ref >60)
Glucose, Bld: 292 mg/dL — ABNORMAL HIGH (ref 70–99)
Phosphorus: 3 mg/dL (ref 2.5–4.6)
Potassium: 4.7 mmol/L (ref 3.5–5.1)
Sodium: 135 mmol/L (ref 135–145)

## 2019-05-28 LAB — BASIC METABOLIC PANEL
Anion gap: 15 (ref 5–15)
BUN: 68 mg/dL — ABNORMAL HIGH (ref 6–20)
CO2: 17 mmol/L — ABNORMAL LOW (ref 22–32)
Calcium: 8.6 mg/dL — ABNORMAL LOW (ref 8.9–10.3)
Chloride: 104 mmol/L (ref 98–111)
Creatinine, Ser: 6.69 mg/dL — ABNORMAL HIGH (ref 0.61–1.24)
GFR calc Af Amer: 9 mL/min — ABNORMAL LOW (ref >60)
GFR calc non Af Amer: 8 mL/min — ABNORMAL LOW (ref >60)
Glucose, Bld: 189 mg/dL — ABNORMAL HIGH (ref 70–99)
Potassium: 5.6 mmol/L — ABNORMAL HIGH (ref 3.5–5.1)
Sodium: 136 mmol/L (ref 135–145)

## 2019-05-28 LAB — GLUCOSE, CAPILLARY: Glucose-Capillary: 170 mg/dL — ABNORMAL HIGH (ref 70–99)

## 2019-05-28 LAB — HIV ANTIBODY (ROUTINE TESTING W REFLEX): HIV Screen 4th Generation wRfx: NONREACTIVE

## 2019-05-28 MED ORDER — DOXYCYCLINE HYCLATE 100 MG PO TABS: 100.0000 mg | ORAL_TABLET | Freq: Two times a day (BID) | ORAL | 0 refills | Status: AC

## 2019-05-28 MED ORDER — CHLORHEXIDINE GLUCONATE CLOTH 2 % EX PADS: 6.0000 | MEDICATED_PAD | Freq: Every day | CUTANEOUS | Status: DC

## 2019-05-28 NOTE — Discharge Instructions (Signed)
Your blood cell counts today are normal.  Your CT scan shows some swelling, potentially some mild cellulitis in and around the neck.  We do not see any more widespread concerning infections.  You have been prescribed Doxycycline which is an antibiotic to take.  Please follow-up with your doctor for recheck in 3 days.  Return to the emergency department if you have fever, worsening swelling or pain in your neck, difficulty breathing or swallowing.   Please follow-up with your dialysis center for routine dialysis as planned.

## 2019-05-28 NOTE — Progress Notes (Signed)
Pt returned from HD.

## 2019-05-28 NOTE — Discharge Summary (Signed)
Name: [name] [name] MRN: [id number] DOB: September 22, 1960 59 y.o. PCP: Nickola Major, MD  Date of Admission: 05/27/2019  9:41 AM Date of Discharge:  05/28/2019 Attending Physician: Aldine Contes, MD  Discharge Diagnosis: 1. Hyperkalemia 2. Submandibular cellulitis  Discharge Medications: Allergies as of 05/28/2019       Reactions   Lisinopril Cough        Medication List     STOP taking these medications    cyanocobalamin 1000 MCG tablet   QUEtiapine 25 MG tablet Commonly known as: SEROQUEL       TAKE these medications    amLODipine 10 MG tablet Commonly known as: NORVASC Take 10 mg by mouth daily.   Auryxia 1 GM 210 MG(Fe) tablet Generic drug: ferric citrate Take 420 mg by mouth daily.   cinacalcet 30 MG tablet Commonly known as: SENSIPAR Take 30 mg by mouth daily.   doxycycline 100 MG tablet Commonly known as: VIBRA-TABS Take 1 tablet (100 mg total) by mouth every 12 (twelve) hours for 6 days.   levothyroxine 50 MCG tablet Commonly known as: SYNTHROID Take 50 mcg by mouth daily before breakfast.   pantoprazole 40 MG tablet Commonly known as: PROTONIX Take 80 mg by mouth daily.   rosuvastatin 5 MG tablet Commonly known as: CRESTOR Take 5 mg by mouth daily.   sevelamer carbonate 800 MG tablet Commonly known as: RENVELA Take 800 mg by mouth 3 (three) times daily.   sodium bicarbonate 650 MG tablet Take 650 mg by mouth 2 (two) times daily.   venlafaxine XR 150 MG 24 hr capsule Commonly known as: EFFEXOR-XR Take 1 capsule (150 mg total) by mouth daily with breakfast.        Disposition and follow-up:   [name] [name] was discharged from [facility] in Stable condition.  At the hospital follow up visit please address:  1.  Hyperkalemia resolved after HD.        Submandibular Cellulitis       - treating for skin flora, if patient worsens consider broadening to cover oral flora.       - Sent out on  Doxycycline 100 mg BID x 7 days.        -  Evaluate for resolution of cellulitis.   2.  Labs / imaging needed at time of follow-up: na  3.  Pending labs/ test needing follow-up: na  Follow-up Appointments: Follow-up Information     Nickola Major, MD In 2 days.   Specialty: Family Medicine Contact information: [address] [place] [address] [place].   Specialty: Emergency Medicine Why: If symptoms worsen Contact information: [address] [id number] [place] by problem list: 1.   #ESRD on TTh HD #Hyperkalemia End-stage renal disease on Tuesdays and Thursdays HD.  Patient missed his last HD on Thursday and found to be hyperkalemic to 6.8 on evaluation of bilateral neck swelling. No EKG changes noted. Given Lokelma and 5 units Insulin in ED with potassium of 5.4. He was kept overnight for early morining hemodialysis and recheck of his potassium was 4.7. He was discharged after and should  resume outpatient HD on 2/23.   #Bilateral neck cellulitis Bilateral neck cellulitis diagnosed in ED with CT. Cellulitis looks like it is improving. Patient switched to doxycycline in setting  of [place] biogram. We are treating for cellulitis and chose doxycycline for skin flora. Patient given directions to return to ED if condition worsens and should follow up with his PCP in a week to evaluate for resolution.  Plan: - Continue Doxycycline 100 mg BID x 7 days , stop date after one dose on 2/28  Discharge Vitals:   BP 131/80 (BP Location: Left Arm)   Pulse (!) 104   Temp 97.9 F (36.6 C) (Oral)   Resp 16   Ht 5\' 10"  (1.778 m)   Wt 93 kg   SpO2 100%   BMI 29.41 kg/m   Pertinent Labs, Studies, and Procedures:  CBC Latest Ref Rng & Units 05/28/2019 05/27/2019 03/20/2019  WBC 4.0 - 10.5 K/uL 7.8 5.5 6.1  Hemoglobin 13.0 -  17.0 g/dL 10.9(L) 10.3(L) 10.9(L)  Hematocrit 39.0 - 52.0 % 32.8(L) 31.1(L) 33.7(L)  Platelets 150 - 400 K/uL 178 166 208   BMP Latest Ref Rng & Units 05/28/2019 05/28/2019 05/27/2019  Glucose 70 - 99 mg/dL 292(H) 189(H) -  BUN 6 - 20 mg/dL 38(H) 68(H) -  Creatinine 0.61 - 1.24 mg/dL 4.53(H) 6.69(H) -  Sodium 135 - 145 mmol/L 135 136 -  Potassium 3.5 - 5.1 mmol/L 4.7 5.6(H) 5.5(H)  Chloride 98 - 111 mmol/L 96(L) 104 -  CO2 22 - 32 mmol/L 24 17(L) -  Calcium 8.9 - 10.3 mg/dL 8.4(L) 8.6(L) -   CLINICAL DATA:  Cellulitis with neck swelling for 1 week. Noncontrast study due to a end-stage renal disease.  EXAM: CT NECK WITHOUT CONTRAST  TECHNIQUE: Multidetector CT imaging of the neck was performed following the standard protocol without intravenous contrast.  COMPARISON:  None.  FINDINGS: Pharynx and larynx: There is no evidence of mass or swelling. High-density along the posterior tongue is attributed to ingested medication.  Salivary glands: Prominent size of the low-density/fatty infiltrated submandibular glands. No stone or acute inflammation is seen.  Thyroid: Normal  Lymph nodes: None enlarged or abnormal density.  Vascular: Mild atherosclerotic calcification.  Limited intracranial: Negative  Visualized orbits: Limited coverage is negative  Mastoids and visualized paranasal sinuses: Negative  Skeleton: No acute finding.  Lower cervical degenerative spurring.  Upper chest: Negative  Other: There is bilateral subcutaneous stranding and platysma thickening at the level of the submandibular spaces. The underlying submandibular glands do not appear primarily inflamed.  IMPRESSION: Subcutaneous stranding and platysma thickening in the submandibular regions that correlates with history of cellulitis. Given the proximity to the submandibular glands sialadenitis is considered but there is no no definite primary submandibular inflammation.  Discharge  Instructions:    Signed:  Tamsen Snider, MD PGY1

## 2019-05-28 NOTE — Progress Notes (Signed)
Inpatient Diabetes Program Recommendations  AACE/ADA: New Consensus Statement on Inpatient Glycemic Control (2015)  Target Ranges:  Prepandial:   less than 140 mg/dL      Peak postprandial:   less than 180 mg/dL (1-2 hours)      Critically ill patients:  140 - 180 mg/dL   Lab Results  Component Value Date   GLUCAP 170 (H) 05/28/2019   HGBA1C 6.1 (H) 10/21/2015    Review of Glycemic Control Results for DARION, MILEWSKI (MRN [id number]) as of 05/28/2019 09:55  Ref. Range 05/27/2019 11:30 05/27/2019 12:46 05/28/2019 06:54  Glucose-Capillary Latest Ref Range: 70 - 99 mg/dL 304 (H) 197 (H) 170 (H)   Diabetes history: DM 2 diet controlled Outpatient Diabetes medications: None Current orders for Inpatient glycemic control:  None  Inpatient Diabetes Program Recommendations:    Please add Novolog sensitive correction tid w/ meals and HS and check A1C.    Thanks  Adah Perl, RN, BC-ADM Inpatient Diabetes Coordinator Pager [phone] (8a-5p)

## 2019-05-28 NOTE — Progress Notes (Signed)
Discharge teaching complete. Meds, diet,activity, follow up appointments reviewed and all questions answered. Copy of instructions given to patient and prescription sent to pharmacy.  

## 2019-05-28 NOTE — Progress Notes (Signed)
   Subjective:  Patient seen in his room after HD early this morning. He feels as if his submandibular swelling has decreased . He denies  problems with swallowing or difficulty breathing.  Objective:  Vital signs in last 24 hours: Vitals:   05/27/19 1730 05/27/19 1840 05/28/19 0002 05/28/19 0657  BP: (!) 181/88 (!) 173/80 (!) 178/99 131/80  Pulse: 95 (!) 103 (!) 101 (!) 104  Resp: 16 18 16 16   Temp:  98.7 F (37.1 C) 98 F (36.7 C) 97.9 F (36.6 C)  TempSrc:  Oral Oral Oral  SpO2: 100% 100% 97% 100%  Weight:      Height:       Gen: NAD, normal appearing HEENT: EOMI, bilateral submandibular edema ( improving) CV: RRR, no m/r/g Pulm: CTAB, no wheezes,rhonchi or rales Abd: active bowel sounds, no guarding   CBC Latest Ref Rng & Units 05/28/2019 05/27/2019 03/20/2019  WBC 4.0 - 10.5 K/uL 7.8 5.5 6.1  Hemoglobin 13.0 - 17.0 g/dL 10.9(L) 10.3(L) 10.9(L)  Hematocrit 39.0 - 52.0 % 32.8(L) 31.1(L) 33.7(L)  Platelets 150 - 400 K/uL 178 166 208   BMP Latest Ref Rng & Units 05/28/2019 05/28/2019 05/27/2019  Glucose 70 - 99 mg/dL 292(H) 189(H) -  BUN 6 - 20 mg/dL 38(H) 68(H) -  Creatinine 0.61 - 1.24 mg/dL 4.53(H) 6.69(H) -  Sodium 135 - 145 mmol/L 135 136 -  Potassium 3.5 - 5.1 mmol/L 4.7 5.6(H) 5.5(H)  Chloride 98 - 111 mmol/L 96(L) 104 -  CO2 22 - 32 mmol/L 24 17(L) -  Calcium 8.9 - 10.3 mg/dL 8.4(L) 8.6(L) -     Assessment/Plan:  Active Problems:   Hyperkalemia  [name] [name] is a 59 year old male with past medical history of HTN, HLD, hypothyroidism, MDD, bladder cancer in remission, status post left nephrectomy, and end-stage renal disease on TTh HD who presented to the ED for evaluation of bilateral neck swelling and also found to be hyperkalemic to 6.8.  #ESRD on TTh HD #Hyperkalemia Patient went for hemodialysis this morning and recheck of his potassium was 4.7. He will be discharged today and resume outpatient HD tomorrow.   #Bilateral neck  cellulitis Cellulitis looks like it is improving.  Patient switched to doxycycline in setting of [facility] biogram.  We are treating for cellulitis and chose doxycycline for skin flora. Patient given directions to return to ED if condition worsens and should follow up with his PCP in a week to evaluate for resolution.  Plan: - Continue Doxycycline 100 mg BID x 7 days , stop date after one dose on 2/28  Prior to Admission Living Arrangement: Home Anticipated Discharge Location: Home Barriers to Discharge: None Dispo: Anticipated discharge today  Tamsen Snider, MD PGY1

## 2019-05-28 NOTE — Progress Notes (Signed)
Pt transported to HD. Vitals WDL. No acute distress noted.

## 2019-05-28 NOTE — Progress Notes (Addendum)
[facility] Progress Note   Subjective:  Seen in room - HD done overnight. Repeat K better today.   Objective Vitals:   05/27/19 1730 05/27/19 1840 05/28/19 0002 05/28/19 0657  BP: (!) 181/88 (!) 173/80 (!) 178/99 131/80  Pulse: 95 (!) 103 (!) 101 (!) 104  Resp: 16 18 16 16   Temp:  98.7 F (37.1 C) 98 F (36.7 C) 97.9 F (36.6 C)  TempSrc:  Oral Oral Oral  SpO2: 100% 100% 97% 100%  Weight:      Height:       Physical Exam General: Well appearing man, NAD.  Heart: RRR; no murmur Lungs: CTAB Abdomen: soft, distended Extremities: No LE edema Dialysis Access:  AVF + thrill  Additional Objective Labs: Basic Metabolic Panel: Recent Labs  Lab 05/27/19 1033 05/27/19 1033 05/27/19 1350 05/27/19 1350 05/27/19 1714 05/28/19 0115 05/28/19 0843  NA 133*  --   --   --   --  136 135  K 6.8*   < > 5.4*   < > 5.5* 5.6* 4.7  CL 104  --   --   --   --  104 96*  CO2 19*  --   --   --   --  17* 24  GLUCOSE 336*   < > 166*  --   --  189* 292*  BUN 60*  --   --   --   --  68* 38*  CREATININE 6.33*  --   --   --   --  6.69* 4.53*  CALCIUM 8.4*  --   --   --   --  8.6* 8.4*  PHOS  --   --   --   --   --   --  3.0   < > = values in this interval not displayed.   Liver Function Tests: Recent Labs  Lab 05/28/19 0843  ALBUMIN 3.7   CBC: Recent Labs  Lab 05/27/19 1033 05/28/19 0115  WBC 5.5 7.8  NEUTROABS 3.8  --   HGB 10.3* 10.9*  HCT 31.1* 32.8*  MCV 89.1 88.2  PLT 166 178   CBG: Recent Labs  Lab 05/27/19 1130 05/27/19 1246 05/28/19 0654  GLUCAP 304* 197* 170*   Studies/Results: CT Soft Tissue Neck Wo Contrast  Result Date: 05/27/2019 CLINICAL DATA:  Cellulitis with neck swelling for 1 week. Noncontrast study due to a end-stage renal disease. EXAM: CT NECK WITHOUT CONTRAST TECHNIQUE: Multidetector CT imaging of the neck was performed following the standard protocol without intravenous contrast. COMPARISON:  None. FINDINGS: Pharynx and larynx: There  is no evidence of mass or swelling. High-density along the posterior tongue is attributed to ingested medication. Salivary glands: Prominent size of the low-density/fatty infiltrated submandibular glands. No stone or acute inflammation is seen. Thyroid: Normal Lymph nodes: None enlarged or abnormal density. Vascular: Mild atherosclerotic calcification. Limited intracranial: Negative Visualized orbits: Limited coverage is negative Mastoids and visualized paranasal sinuses: Negative Skeleton: No acute finding.  Lower cervical degenerative spurring. Upper chest: Negative Other: There is bilateral subcutaneous stranding and platysma thickening at the level of the submandibular spaces. The underlying submandibular glands do not appear primarily inflamed. IMPRESSION: Subcutaneous stranding and platysma thickening in the submandibular regions that correlates with history of cellulitis. Given the proximity to the submandibular glands sialadenitis is considered but there is no no definite primary submandibular inflammation. Electronically Signed   By: Monte Fantasia M.D.   On: 05/27/2019 13:05   Medications:  . amLODipine  10 mg Oral Daily  . cinacalcet  30 mg Oral Daily  . doxycycline  100 mg Oral Q12H  . ferric citrate  420 mg Oral Daily  . heparin  5,000 Units Subcutaneous Q8H  . levothyroxine  50 mcg Oral QAC breakfast  . pantoprazole  80 mg Oral Daily  . rosuvastatin  5 mg Oral Daily  . sevelamer carbonate  800 mg Oral TID WC  . sodium bicarbonate  650 mg Oral BID  . sodium zirconium cyclosilicate  10 g Oral Daily  . venlafaxine XR  150 mg Oral Q breakfast    Dialysis Orders: Tues/Thurs NW.  Assessment/Plan: 1. Hyperkalemia: S/p HD overnight. On bicarb + Lokelma QD. 2. Neck cellulitis?: Looks better today. On course of PO doxycycline. 3. ESRD: Usual Tues-Thurs HD sched - missed last treatment. Discussed need for compliance since only sched for 2d/week HD - if can't comply, may need more HD - he  understands. Next HD tomorrow, likely as outpatient. 4. HTN/volume: BP controlled - no edema. 5. Secondary hyperparathyroidism: Ca/Phos ok - continue home binders.    Veneta Penton, PA-C 05/28/2019, 10:20 AM  [employer] Pager: [phone]

## 2019-05-29 ENCOUNTER — Telehealth (HOSPITAL_COMMUNITY): Payer: Self-pay | Admitting: Nephrology

## 2019-05-29 NOTE — Telephone Encounter (Signed)
Transition of care contact from inpatient facility  Date of Discharge: 05/28/19  Date of Contact: 05/29/19 -- attempted Method of contact: Phone  Attempted to contact patient to discuss transition of carefrom inpatient admission. Patient did not answer the phone. Message was left on the patient's voicemail with call back number [phone].  Veneta Penton, PA-C [employer] Pager [phone]

## 2020-05-06 ENCOUNTER — Other Ambulatory Visit: Payer: Self-pay

## 2020-05-06 ENCOUNTER — Inpatient Hospital Stay (HOSPITAL_COMMUNITY)
Admission: EM | Admit: 2020-05-06 | Discharge: 2020-05-10 | DRG: 177 | Disposition: A | Discharge status: Home / Self Care | Payer: Medicare Other | Attending: Student in an Organized Health Care Education/Training Program | Admitting: Student in an Organized Health Care Education/Training Program

## 2020-05-06 ENCOUNTER — Emergency Department (HOSPITAL_COMMUNITY): Payer: Medicare Other

## 2020-05-06 DIAGNOSIS — Z992 Dependence on renal dialysis: Secondary | ICD-10-CM | POA: Diagnosis not present

## 2020-05-06 DIAGNOSIS — U071 COVID-19: Principal | ICD-10-CM | POA: Diagnosis present

## 2020-05-06 DIAGNOSIS — Z66 Do not resuscitate: Secondary | ICD-10-CM | POA: Diagnosis present

## 2020-05-06 DIAGNOSIS — T380X5A Adverse effect of glucocorticoids and synthetic analogues, initial encounter: Secondary | ICD-10-CM | POA: Diagnosis not present

## 2020-05-06 DIAGNOSIS — J1282 Pneumonia due to coronavirus disease 2019: Secondary | ICD-10-CM | POA: Diagnosis present

## 2020-05-06 DIAGNOSIS — Z8249 Family history of ischemic heart disease and other diseases of the circulatory system: Secondary | ICD-10-CM

## 2020-05-06 DIAGNOSIS — Z79890 Hormone replacement therapy (postmenopausal): Secondary | ICD-10-CM | POA: Diagnosis not present

## 2020-05-06 DIAGNOSIS — D638 Anemia in other chronic diseases classified elsewhere: Secondary | ICD-10-CM | POA: Diagnosis present

## 2020-05-06 DIAGNOSIS — E869 Volume depletion, unspecified: Secondary | ICD-10-CM | POA: Diagnosis present

## 2020-05-06 DIAGNOSIS — Z8551 Personal history of malignant neoplasm of bladder: Secondary | ICD-10-CM | POA: Diagnosis not present

## 2020-05-06 DIAGNOSIS — K219 Gastro-esophageal reflux disease without esophagitis: Secondary | ICD-10-CM | POA: Diagnosis present

## 2020-05-06 DIAGNOSIS — Z888 Allergy status to other drugs, medicaments and biological substances status: Secondary | ICD-10-CM

## 2020-05-06 DIAGNOSIS — N186 End stage renal disease: Secondary | ICD-10-CM | POA: Diagnosis present

## 2020-05-06 DIAGNOSIS — I120 Hypertensive chronic kidney disease with stage 5 chronic kidney disease or end stage renal disease: Secondary | ICD-10-CM | POA: Diagnosis present

## 2020-05-06 DIAGNOSIS — E872 Acidosis: Secondary | ICD-10-CM | POA: Diagnosis present

## 2020-05-06 DIAGNOSIS — Z79899 Other long term (current) drug therapy: Secondary | ICD-10-CM

## 2020-05-06 DIAGNOSIS — Z85528 Personal history of other malignant neoplasm of kidney: Secondary | ICD-10-CM

## 2020-05-06 DIAGNOSIS — E1122 Type 2 diabetes mellitus with diabetic chronic kidney disease: Secondary | ICD-10-CM | POA: Diagnosis present

## 2020-05-06 DIAGNOSIS — Z87891 Personal history of nicotine dependence: Secondary | ICD-10-CM

## 2020-05-06 DIAGNOSIS — F32A Depression, unspecified: Secondary | ICD-10-CM | POA: Diagnosis present

## 2020-05-06 DIAGNOSIS — J9601 Acute respiratory failure with hypoxia: Secondary | ICD-10-CM | POA: Diagnosis present

## 2020-05-06 DIAGNOSIS — E119 Type 2 diabetes mellitus without complications: Secondary | ICD-10-CM

## 2020-05-06 DIAGNOSIS — Z833 Family history of diabetes mellitus: Secondary | ICD-10-CM | POA: Diagnosis not present

## 2020-05-06 LAB — COMPREHENSIVE METABOLIC PANEL
ALT: 25 U/L (ref 0–44)
AST: 22 U/L (ref 15–41)
Albumin: 2.9 g/dL — ABNORMAL LOW (ref 3.5–5.0)
Alkaline Phosphatase: 73 U/L (ref 38–126)
Anion gap: 18 — ABNORMAL HIGH (ref 5–15)
BUN: 51 mg/dL — ABNORMAL HIGH (ref 8–23)
CO2: 22 mmol/L (ref 22–32)
Calcium: 8.5 mg/dL — ABNORMAL LOW (ref 8.9–10.3)
Chloride: 92 mmol/L — ABNORMAL LOW (ref 98–111)
Creatinine, Ser: 6.13 mg/dL — ABNORMAL HIGH (ref 0.61–1.24)
GFR, Estimated: 10 mL/min — ABNORMAL LOW (ref >60)
Glucose, Bld: 155 mg/dL — ABNORMAL HIGH (ref 70–99)
Potassium: 3.9 mmol/L (ref 3.5–5.1)
Sodium: 132 mmol/L — ABNORMAL LOW (ref 135–145)
Total Bilirubin: 0.7 mg/dL (ref 0.3–1.2)
Total Protein: 7 g/dL (ref 6.5–8.1)

## 2020-05-06 LAB — CBC WITH DIFFERENTIAL/PLATELET
Abs Immature Granulocytes: 0.16 10*3/uL — ABNORMAL HIGH (ref 0.00–0.07)
Basophils Absolute: 0 10*3/uL (ref 0.0–0.1)
Basophils Relative: 0 %
Eosinophils Absolute: 0 10*3/uL (ref 0.0–0.5)
Eosinophils Relative: 0 %
HCT: 28.9 % — ABNORMAL LOW (ref 39.0–52.0)
Hemoglobin: 9.6 g/dL — ABNORMAL LOW (ref 13.0–17.0)
Immature Granulocytes: 2 %
Lymphocytes Relative: 6 %
Lymphs Abs: 0.6 10*3/uL — ABNORMAL LOW (ref 0.7–4.0)
MCH: 29.4 pg (ref 26.0–34.0)
MCHC: 33.2 g/dL (ref 30.0–36.0)
MCV: 88.7 fL (ref 80.0–100.0)
Monocytes Absolute: 0.5 10*3/uL (ref 0.1–1.0)
Monocytes Relative: 5 %
Neutro Abs: 8.4 10*3/uL — ABNORMAL HIGH (ref 1.7–7.7)
Neutrophils Relative %: 87 %
Platelets: 253 10*3/uL (ref 150–400)
RBC: 3.26 MIL/uL — ABNORMAL LOW (ref 4.22–5.81)
RDW: 12.9 % (ref 11.5–15.5)
WBC: 9.8 10*3/uL (ref 4.0–10.5)
nRBC: 0 % (ref 0.0–0.2)

## 2020-05-06 LAB — D-DIMER, QUANTITATIVE: D-Dimer, Quant: 0.99 ug/mL-FEU — ABNORMAL HIGH (ref 0.00–0.50)

## 2020-05-06 LAB — C-REACTIVE PROTEIN: CRP: 16.5 mg/dL — ABNORMAL HIGH (ref <1.0)

## 2020-05-06 LAB — LACTIC ACID, PLASMA: Lactic Acid, Venous: 1.3 mmol/L (ref 0.5–1.9)

## 2020-05-06 LAB — FERRITIN: Ferritin: 3114 ng/mL — ABNORMAL HIGH (ref 24–336)

## 2020-05-06 LAB — LACTATE DEHYDROGENASE: LDH: 253 U/L — ABNORMAL HIGH (ref 98–192)

## 2020-05-06 LAB — PROCALCITONIN: Procalcitonin: 148.22 ng/mL

## 2020-05-06 LAB — TRIGLYCERIDES: Triglycerides: 265 mg/dL — ABNORMAL HIGH (ref <150)

## 2020-05-06 LAB — FIBRINOGEN: Fibrinogen: >800 mg/dL — ABNORMAL HIGH (ref 210–475)

## 2020-05-06 MED ORDER — HEPARIN SODIUM (PORCINE) 5000 UNIT/ML IJ SOLN
5000.0000 [IU] | Freq: Three times a day (TID) | INTRAMUSCULAR | Status: DC
  Administered 2020-05-06 – 2020-05-10 (×12): 5000 [IU] via SUBCUTANEOUS
  Filled 2020-05-06 (×10): qty 1

## 2020-05-06 MED ORDER — PANTOPRAZOLE SODIUM 40 MG PO TBEC
80.0000 mg | DELAYED_RELEASE_TABLET | Freq: Every day | ORAL | Status: DC
  Administered 2020-05-06 – 2020-05-10 (×5): 80 mg via ORAL
  Filled 2020-05-06 (×5): qty 2

## 2020-05-06 MED ORDER — SODIUM CHLORIDE 0.9 % IV SOLN
200.0000 mg | Freq: Once | INTRAVENOUS | Status: AC
  Administered 2020-05-06: 200 mg via INTRAVENOUS
  Filled 2020-05-06: qty 40

## 2020-05-06 MED ORDER — ACETAMINOPHEN 325 MG PO TABS: 650.0000 mg | ORAL_TABLET | Freq: Four times a day (QID) | ORAL | Status: DC | PRN

## 2020-05-06 MED ORDER — GUAIFENESIN-DM 100-10 MG/5ML PO SYRP: 10.0000 mL | ORAL_SOLUTION | ORAL | Status: DC | PRN

## 2020-05-06 MED ORDER — HYDROCOD POLST-CPM POLST ER 10-8 MG/5ML PO SUER: 5.0000 mL | Freq: Two times a day (BID) | ORAL | Status: DC | PRN

## 2020-05-06 MED ORDER — SODIUM CHLORIDE 0.9 % IV SOLN
100.0000 mg | Freq: Every day | INTRAVENOUS | Status: DC
  Administered 2020-05-07 – 2020-05-09 (×3): 100 mg via INTRAVENOUS
  Filled 2020-05-06 (×4): qty 20

## 2020-05-06 MED ORDER — ONDANSETRON HCL 4 MG/2ML IJ SOLN: 4.0000 mg | Freq: Four times a day (QID) | INTRAMUSCULAR | Status: DC | PRN

## 2020-05-06 MED ORDER — SEVELAMER CARBONATE 800 MG PO TABS
800.0000 mg | ORAL_TABLET | Freq: Three times a day (TID) | ORAL | Status: DC
  Administered 2020-05-06 – 2020-05-10 (×11): 800 mg via ORAL
  Filled 2020-05-06 (×13): qty 1

## 2020-05-06 MED ORDER — HYDROCOD POLST-CPM POLST ER 10-8 MG/5ML PO SUER
5.0000 mL | Freq: Once | ORAL | Status: AC
  Administered 2020-05-06: 5 mL via ORAL
  Filled 2020-05-06: qty 5

## 2020-05-06 MED ORDER — ROSUVASTATIN CALCIUM 5 MG PO TABS
5.0000 mg | ORAL_TABLET | Freq: Every day | ORAL | Status: DC
Start: 2020-05-06 — End: 2020-05-10
  Administered 2020-05-06 – 2020-05-10 (×5): 5 mg via ORAL
  Filled 2020-05-06 (×5): qty 1

## 2020-05-06 MED ORDER — SODIUM BICARBONATE 650 MG PO TABS
650.0000 mg | ORAL_TABLET | Freq: Two times a day (BID) | ORAL | Status: DC
  Administered 2020-05-06 – 2020-05-10 (×8): 650 mg via ORAL
  Filled 2020-05-06 (×9): qty 1

## 2020-05-06 MED ORDER — DEXAMETHASONE 6 MG PO TABS
6.0000 mg | ORAL_TABLET | ORAL | Status: DC
  Administered 2020-05-07 – 2020-05-10 (×4): 6 mg via ORAL
  Filled 2020-05-06 (×4): qty 1

## 2020-05-06 MED ORDER — CINACALCET HCL 30 MG PO TABS
30.0000 mg | ORAL_TABLET | Freq: Every day | ORAL | Status: DC
  Administered 2020-05-07 – 2020-05-10 (×3): 30 mg via ORAL
  Filled 2020-05-06 (×3): qty 1

## 2020-05-06 MED ORDER — SODIUM CHLORIDE 0.9 % IV BOLUS
1000.0000 mL | Freq: Once | INTRAVENOUS | Status: AC
  Administered 2020-05-06: 1000 mL via INTRAVENOUS

## 2020-05-06 MED ORDER — ONDANSETRON HCL 4 MG/2ML IJ SOLN
4.0000 mg | Freq: Once | INTRAMUSCULAR | Status: AC
  Administered 2020-05-06: 4 mg via INTRAVENOUS
  Filled 2020-05-06: qty 2

## 2020-05-06 MED ORDER — ONDANSETRON HCL 4 MG PO TABS: 4.0000 mg | ORAL_TABLET | Freq: Four times a day (QID) | ORAL | Status: DC | PRN

## 2020-05-06 MED ORDER — DEXAMETHASONE SODIUM PHOSPHATE 10 MG/ML IJ SOLN
10.0000 mg | Freq: Once | INTRAMUSCULAR | Status: AC
  Administered 2020-05-06: 10 mg via INTRAVENOUS
  Filled 2020-05-06: qty 1

## 2020-05-06 MED ORDER — VENLAFAXINE HCL ER 75 MG PO CP24
150.0000 mg | ORAL_CAPSULE | Freq: Every day | ORAL | Status: DC
  Administered 2020-05-07 – 2020-05-10 (×4): 150 mg via ORAL
  Filled 2020-05-06: qty 2
  Filled 2020-05-06: qty 1
  Filled 2020-05-06 (×3): qty 2

## 2020-05-06 NOTE — H&P (Signed)
Date: 05/06/2020               Patient Name:  [name] [name] MRN: [id number]  DOB: 10-Jul-1960 Age / Sex: 60 y.o., male   PCP: Nickola Major, MD         Medical Service: Internal Medicine Teaching Service         Attending Physician: Dr. Aldine Contes, MD    First Contact: Dr. Alfonse Spruce Pager: [phone]  Second Contact: Dr. Marva Panda Pager: [phone]       After Hours (After 5p/  First Contact Pager: [phone]  weekends / holidays): Second Contact Pager: [phone]   Chief Complaint: weakness  History of Present Illness:   [name] [name] is a 60yo male with PMH of bladder cancer and renal cancer s/p resection, ESRD on HD MWF, severe GERD, and depression presenting with weakness since being diagnosed with covid five days ago. He is accompanied by his wife who had covid before him. He went to HD Friday and Monday and both days took a lot out of him and made him feel even weaker. He thinks he was given back fluid yesterday but is not sure. He endorses shortness of breath, fever, chills, and decreased appetite. He had a cookie yesterday evening but hasn't eaten anything since then. He denies chest pain, cough, sore throat, change in taste, abdominal pain, nausea, headache, changes in vision, sinus pain.   He had both vaccinations but has not yet had his booster shot.   ED Course: Patient required 2L supplemental oxygen to maintain saturations. Vitals otherwise normal. LDH 253, CRP 16.5, ferritin 3,114, >800, .99.  He was given 1L NS and remdesivir was started.    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 Heart disease Mother   . Hypertension Other   . Diabetes Maternal Grandmother   . Brain cancer Paternal Uncle   . Bladder Cancer Neg Hx   . Colon cancer Neg Hx   . Esophageal cancer Neg  Hx   . Rectal cancer Neg Hx   . Stomach cancer Neg Hx      Meds:  Current Meds  Medication Sig  . amLODipine (NORVASC) 10 MG tablet Take 10 mg by mouth daily.  Lorin Picket 1 GM 210 MG(Fe) tablet Take 420 mg by mouth daily.   . cinacalcet (SENSIPAR) 30 MG tablet Take 30 mg by mouth every Monday, Wednesday, and Friday.  . pantoprazole (PROTONIX) 40 MG tablet Take 40 mg by mouth 2 (two) times daily.  . sodium bicarbonate 650 MG tablet Take 650 mg by mouth 2 (two) times daily.  Marland Kitchen venlafaxine XR (EFFEXOR-XR) 150 MG 24 hr capsule Take 1 capsule (150 mg total) by mouth daily with breakfast.     Allergies: Allergies as of 05/06/2020 - Review Complete 05/06/2020  Allergen Reaction Noted  . Lisinopril Cough 05/30/2013   Past Medical History:  Diagnosis Date  . Allergy   . Anemia   . Anemia in chronic kidney disease (CKD) 08/12/2016  . Anxiety   . Bladder cancer ([place]) 2013  . Blood transfusion without reported diagnosis   . Coagulation defect, unspecified ([place]) 08/26/2016  . Complication of anesthesia    agitation w/awakening in 9/13,OK 01/24/12  . Depression   .  Diabetes mellitus type 2, diet-controlled ([place]) PT STOPPED TAKING METFORMIN--  HAS BEEN WATCHING DIET, EXERCISING AND LOSING WT  . ESRD (end stage renal disease) ([place]) 08/11/2016  . Frequency of urination   . GERD (gastroesophageal reflux disease)   . Gout    recent flair -bilateral feet--  STABLE  . Hyperkalemia 08/12/2016  . Hypertension   . Hypertensive kidney disease with CKD (chronic kidney disease)   . Nocturia   . Non-compliance with renal dialysis ([place])   . Secondary hyperparathyroidism of renal origin ([place]) 08/12/2016  . Urgency incontinence      Review of Systems: A complete ROS was negative except as per HPI.   Physical Exam: Blood pressure 122/70, pulse 77, temperature 99.3 F (37.4 C), temperature source Oral, resp. rate 13, height 5\' 10"  (1.778 m), weight 88 kg, SpO2 94 %.  Constitution: acutely ill  appearing, NAD  HENT: [place]/AT Eyes: no icterus or injection  Cardio: RRR, no m/r/g; no LE edema Respiratory: inspiratory and expiratory crackles all fields  Abdominal: soft,non-distended, NTTP MSK: moving all extremities  Neuro: a&ox3, normal affect  Skin: c/d/i, dry appearing    EKG: personally reviewed my interpretation is normal sinus rhythm  CXR: personally reviewed my interpretation is bilateral patchy infiltrates  Assessment & Plan by Problem: Active Problems:   Pneumonia due to COVID-19 virus  [name] [name] is a 59yo male with PMH of bladder cancer and renal cancer s/p resection, ESRD on HD MWF, severe GERD, and depression presenting with weakness since being diagnosed with covid five days ago.  Covid-19 Pneumonia  Diagnosed 1/27, wife will try to locate his positive test result. He is requiring 2L supplemental oxygen and has significantly elevated inflammatory markers. S/p vaccination but no booster. Dry on exam with decreased po intake, LA normal.   - remdesivir 5 days - decadron 10 days  - prn robitussen  - trend inflammatory markers  - supplemental O2, keep sats >90% - s/p 1L ns, hold further fluids with ESRD.    ESRD on HD HD yesterday. No need for urgent HD.   - nephrology consulted for HD  - cont. renvela - cont. Sodium bicarb 650 bid  GERD Has history of severe GERD.   - cont. Home protonix 80 mg qd   Diet: regular  VTE: heparin  IVF: s/p 1L NS Code: DNR   Dispo: Admit patient to Inpatient with expected length of stay greater than 2 midnights.  SignedMarty Heck, DO 05/06/2020, 4:02 PM  Pager: [phone]

## 2020-05-06 NOTE — ED Notes (Signed)
Lunch Tray Ordered @ [id number].

## 2020-05-06 NOTE — ED Notes (Signed)
Dinner Tray Ordered @ 1651. 

## 2020-05-06 NOTE — ED Provider Notes (Signed)
[facility] EMERGENCY DEPARTMENT Provider Note   CSN: [id number] Arrival date & time: 05/06/20  0857     History Chief Complaint  Patient presents with  . Shortness of Breath    [name] [name] is a 60 y.o. male.  60 yo M with a chief complaints of inability to eat and drink and worsening difficulty breathing.  Patient tested positive for the coronavirus about 5 days ago.  Has been having symptoms for about a week.  He denies abdominal pain.  Denies chest pain.  The history is provided by the patient.  Shortness of Breath Severity:  Moderate Onset quality:  Gradual Duration:  1 week Timing:  Constant Progression:  Worsening Chronicity:  New Relieved by:  Nothing Worsened by:  Nothing Ineffective treatments:  None tried Associated symptoms: cough, fever and vomiting   Associated symptoms: no abdominal pain, no chest pain, no headaches and no rash        Past Medical History:  Diagnosis Date  . Allergy   . Anemia   . Anemia in chronic kidney disease (CKD) 08/12/2016  . Anxiety   . Bladder cancer ([place]) 2013  . Blood transfusion without reported diagnosis   . Coagulation defect, unspecified ([place]) 08/26/2016  . Complication of anesthesia    agitation w/awakening in 9/13,OK 01/24/12  . Depression   . Diabetes mellitus type 2, diet-controlled ([place]) PT STOPPED TAKING METFORMIN--  HAS BEEN WATCHING DIET, EXERCISING AND LOSING WT  . ESRD (end stage renal disease) ([place]) 08/11/2016  . Frequency of urination   . GERD (gastroesophageal reflux disease)   . Gout    recent flair -bilateral feet--  STABLE  . Hyperkalemia 08/12/2016  . Hypertension   . Hypertensive kidney disease with CKD (chronic kidney disease)   . Nocturia   . Non-compliance with renal dialysis ([place])   . Secondary hyperparathyroidism of renal origin ([place]) 08/12/2016  . Urgency incontinence     Patient Active Problem List   Diagnosis Date Noted  . Hyperkalemia 09/14/2017  .  Abnormal EKG 09/14/2017  . Non-compliance with renal dialysis ([place])   . ESRD needing dialysis ([place]) 11/26/2016  . Vitamin B12 deficiency 11/19/2016  . GERD (gastroesophageal reflux disease) 11/16/2016  . Suicidal ideation 11/16/2016  . Pyelonephritis 10/20/2015  . Hydronephrosis 10/20/2015  . E-coli UTI   . Anemia of chronic disease   . Acute encephalopathy 10/19/2014  . Altered mental status 10/19/2014  . Faintness   . Syncope 07/06/2014  . Syncope and collapse 07/05/2014  . CKD (chronic kidney disease), stage IV ([place]) 07/05/2014  . Acute coccygeal pain 07/05/2014  . Occipital scalp laceration 07/05/2014  . Diabetes mellitus type 2, diet-controlled ([place]) 07/05/2014  . Protein calorie malnutrition ([place]) 11/29/2013  . AKI (acute kidney injury) ([place]) 10/30/2013  . Hypercalcemia 06/26/2013  . UTI (lower urinary tract infection) 06/25/2013  . Bladder cancer ([place]) 05/09/2013  . Major depressive disorder, recurrent severe without psychotic features ([place]) 09/03/2005    Past Surgical History:  Procedure Laterality Date  . AV FISTULA PLACEMENT Left 05/31/2014   Procedure: LEFT RADIOCEPHALIC ARTERIOVENOUS (AV) FISTULA CREATION ;  Surgeon: Mal Misty, MD;  Location: [facility];  Service: Vascular;  Laterality: Left;  . BACK SURGERY    . BLADDER SURGERY  04/26/2013   at the cancer treatment center of [place] in  [place]    . COLONOSCOPY    . CYSTOSCOPY W/ RETROGRADES Bilateral 07/10/2012   Procedure: CYSTOSCOPY WITH RETROGRADE PYELOGRAM;  Surgeon: Molli Hazard,  MD;  Location: [facility];  Service: Urology;  Laterality: Bilateral;  . CYSTOSCOPY WITH URETHRAL DILATATION  01/24/2012   Procedure: CYSTOSCOPY WITH URETHRAL DILATATION;  Surgeon: Molli Hazard, MD;  [facility];  Service: Urology;  Laterality: N/A;  . IR NEPHROSTOMY PLACEMENT LEFT  07/16/2016  . LUMBAR LAMINECTOMY  06-02-2005   W/ RESECTION NERVE ROOT  L4  -  L5  . NEPHROSTOMY  TUBE PLACEMENT ([place] HX)  2015   right   . TRANSURETHRAL RESECTION OF BLADDER TUMOR  12/15/2011   Procedure: TRANSURETHRAL RESECTION OF BLADDER TUMOR (TURBT);  Surgeon: Molli Hazard, MD;  Location: [facility];  Service: Urology;  Laterality: N/A;  2 HRS   . TRANSURETHRAL RESECTION OF BLADDER TUMOR  01/24/2012   Procedure: TRANSURETHRAL RESECTION OF BLADDER TUMOR (TURBT);  Surgeon: Molli Hazard, MD;  Location[facility];  Service: Urology;  Laterality: N/A;  90 MIN   . TRANSURETHRAL RESECTION OF BLADDER TUMOR N/A 07/10/2012   Procedure: TRANSURETHRAL RESECTION OF BLADDER TUMOR (TURBT);  Surgeon: Molli Hazard, MD;  Location: [facility];  Service: Urology;  Laterality: N/A;  . ulner artery repair Right 2009   rt -trauma /thrombectomy,repair       Family History  Problem Relation Age of Onset  . Heart disease Mother   . Hypertension Other   . Diabetes Maternal Grandmother   . Brain cancer Paternal Uncle   . Bladder Cancer Neg Hx   . Colon cancer Neg Hx   . Esophageal cancer Neg Hx   . Rectal cancer Neg Hx   . Stomach cancer Neg Hx     Social History   Tobacco Use  . Smoking status: Former Smoker    Packs/day: 1.00    Years: 16.00    Pack years: 16.00    Types: Cigarettes    Quit date: 01/20/1992    Years since quitting: 28.3  . Smokeless tobacco: Former Systems developer    Types: [place] date: 01/20/1992  Vaping Use  . Vaping Use: Never used  Substance Use Topics  . Alcohol use: No    Alcohol/week: 0.0 standard drinks  . Drug use: No    Home Medications Prior to Admission medications   Medication Sig Start Date End Date Taking? Authorizing Provider  amLODipine (NORVASC) 10 MG tablet Take 10 mg by mouth daily. 03/22/19   [provider]  AURYXIA 1 GM 210 MG(Fe) tablet Take 420 mg by mouth daily.  04/14/18   [provider]  cinacalcet (SENSIPAR) 30 MG tablet Take 30 mg by mouth daily.     [provider]  levothyroxine (SYNTHROID, LEVOTHROID) 50 MCG tablet Take 50 mcg by mouth daily before breakfast.  01/13/18 05/27/19  [provider]  pantoprazole (PROTONIX) 40 MG tablet Take 80 mg by mouth daily.  05/11/14   [provider]  rosuvastatin (CRESTOR) 5 MG tablet Take 5 mg by mouth daily. 05/26/19   [provider]  sevelamer carbonate (RENVELA) 800 MG tablet Take 800 mg by mouth 3 (three) times daily.  08/11/16   [provider]  sodium bicarbonate 650 MG tablet Take 650 mg by mouth 2 (two) times daily.    [provider]  venlafaxine XR (EFFEXOR-XR) 150 MG 24 hr capsule Take 1 capsule (150 mg total) by mouth daily with breakfast. 07/06/17   Ursula Alert, MD  QUEtiapine (SEROQUEL) 25 MG tablet Take 1 tablet (25 mg  total) by mouth at bedtime. Patient not taking: Reported on 05/04/2018 07/06/17 05/27/19  Ursula Alert, MD    Allergies    Lisinopril  Review of Systems   Review of Systems  Constitutional: Positive for chills and fever.  HENT: Negative for congestion and facial swelling.   Eyes: Negative for discharge and visual disturbance.  Respiratory: Positive for cough and shortness of breath.   Cardiovascular: Negative for chest pain and palpitations.  Gastrointestinal: Positive for diarrhea, nausea and vomiting. Negative for abdominal pain.  Musculoskeletal: Positive for arthralgias and myalgias.  Skin: Negative for color change and rash.  Neurological: Negative for tremors, syncope and headaches.  Psychiatric/Behavioral: Negative for confusion and dysphoric mood.    Physical Exam Updated Vital Signs BP (!) 98/57 (BP Location: Right Arm)   Pulse (!) 101   Temp 99.3 F (37.4 C) (Oral)   Resp 18   Ht 5\' 10"  (1.778 m)   Wt 88 kg   SpO2 (S) (!) 88% Comment: placed on 2Lm/[place]  BMI 27.84 kg/m   Physical Exam Vitals and nursing note reviewed.  Constitutional:      Appearance: He is well-developed and  well-nourished.  HENT:     Head: Normocephalic and atraumatic.  Eyes:     Extraocular Movements: EOM normal.     Pupils: Pupils are equal, round, and reactive to light.  Neck:     Vascular: No JVD.  Cardiovascular:     Rate and Rhythm: Normal rate and regular rhythm.     Heart sounds: No murmur heard. No friction rub. No gallop.   Pulmonary:     Effort: No respiratory distress.     Breath sounds: No wheezing.  Abdominal:     General: There is no distension.     Tenderness: There is no guarding or rebound.  Musculoskeletal:        General: Normal range of motion.     Cervical back: Normal range of motion and neck supple.  Skin:    Coloration: Skin is not pale.     Findings: No rash.  Neurological:     Mental Status: He is alert and oriented to person, place, and time.  Psychiatric:        Mood and Affect: Mood and affect normal.        Behavior: Behavior normal.     ED Results / Procedures / Treatments   Labs (all labs ordered are listed, but only abnormal results are displayed) Labs Reviewed  CULTURE, BLOOD (ROUTINE X 2)  CULTURE, BLOOD (ROUTINE X 2)  LACTIC ACID, PLASMA  LACTIC ACID, PLASMA  CBC WITH DIFFERENTIAL/PLATELET  COMPREHENSIVE METABOLIC PANEL  D-DIMER, QUANTITATIVE (NOT AT [facility])  PROCALCITONIN  LACTATE DEHYDROGENASE  FERRITIN  TRIGLYCERIDES  FIBRINOGEN  C-REACTIVE PROTEIN    EKG EKG Interpretation  Date/Time:  Tuesday May 06 2020 09:26:56 EST Ventricular Rate:  97 PR Interval:    QRS Duration: 101 QT Interval:  351 QTC Calculation: 446 R Axis:   30 Text Interpretation: Sinus rhythm No significant change since last tracing Confirmed by Deno Etienne [phone]) on 05/06/2020 9:55:18 AM   Radiology No results found.  Procedures Procedures   Medications Ordered in ED Medications  sodium chloride 0.9 % bolus 1,000 mL (has no administration in time range)  chlorpheniramine-HYDROcodone (TUSSIONEX) 10-8 MG/5ML suspension 5 mL (has no  administration in time range)  ondansetron (ZOFRAN) injection 4 mg (has no administration in time range)  dexamethasone (DECADRON) injection 10 mg (has no administration in time range)  remdesivir 200 mg in sodium chloride 0.9% 250 mL IVPB (has no administration in time range)    Followed by  remdesivir 100 mg in sodium chloride 0.9 % 100 mL IVPB (has no administration in time range)    ED Course  I have reviewed the triage vital signs and the nursing notes.  Pertinent labs & imaging results that were available during my care of the patient were reviewed by me and considered in my medical decision making (see chart for details).    MDM Rules/Calculators/A&P                          60 yo M with a chief complaint of COVID-19 infection.  Worsening nausea vomiting diarrhea and shortness of breath.  With minimal exertion with EMS down into the 70s on room air.  Improved with 2 L of oxygen.  Will start on Decadron and remdesivir.  Lab work.  Chest x-ray.  Admit.  CRITICAL CARE Performed by: Cecilio Asper   Total critical care time: 35 minutes  Critical care time was exclusive of separately billable procedures and treating other patients.  Critical care was necessary to treat or prevent imminent or life-threatening deterioration.  Critical care was time spent personally by me on the following activities: development of treatment plan with patient and/or surrogate as well as nursing, discussions with consultants, evaluation of patient's response to treatment, examination of patient, obtaining history from patient or surrogate, ordering and performing treatments and interventions, ordering and review of laboratory studies, ordering and review of radiographic studies, pulse oximetry and re-evaluation of patient's condition.  The patients results and plan were reviewed and discussed.   Any x-rays performed were independently reviewed by myself.   Differential diagnosis were considered  with the presenting HPI.  Medications  sodium chloride 0.9 % bolus 1,000 mL (has no administration in time range)  chlorpheniramine-HYDROcodone (TUSSIONEX) 10-8 MG/5ML suspension 5 mL (has no administration in time range)  ondansetron (ZOFRAN) injection 4 mg (has no administration in time range)  dexamethasone (DECADRON) injection 10 mg (has no administration in time range)  remdesivir 200 mg in sodium chloride 0.9% 250 mL IVPB (has no administration in time range)    Followed by  remdesivir 100 mg in sodium chloride 0.9 % 100 mL IVPB (has no administration in time range)    Vitals:   05/06/20 0906 05/06/20 0920  BP: 110/62 (!) 98/57  Pulse: (!) 103 (!) 101  Resp: 20 18  Temp: 99.1 F (37.3 C) 99.3 F (37.4 C)  TempSrc: Oral Oral  SpO2: 94% (S) (!) 88%  Weight:  88 kg  Height:  5\' 10"  (1.778 m)    Final diagnoses:  Pneumonia due to COVID-19 virus  Acute respiratory failure with hypoxia ([place])    Admission/ observation were discussed with the admitting physician, patient and/or family and they are comfortable with the plan.   Final Clinical Impression(s) / ED Diagnoses Final diagnoses:  Pneumonia due to COVID-19 virus  Acute respiratory failure with hypoxia [facility])    Rx / DC Orders ED Discharge Orders    None       Deno Etienne, DO 05/06/20 [phone]

## 2020-05-06 NOTE — ED Triage Notes (Addendum)
Pt arrives via EMs from home, diagnosed COVID + on Thursday. Called today for increased SOB. Fever 101.3 at home. Initial oxygen saturation, 70% on RA. Now 95% on 2L. Pt awake, alert, oriented x4. bp 118/76, hr 107, cbg 170. PT dialysis M and F

## 2020-05-07 ENCOUNTER — Encounter (HOSPITAL_COMMUNITY): Payer: Self-pay | Admitting: Internal Medicine

## 2020-05-07 DIAGNOSIS — J1282 Pneumonia due to coronavirus disease 2019: Secondary | ICD-10-CM

## 2020-05-07 DIAGNOSIS — U071 COVID-19: Secondary | ICD-10-CM | POA: Diagnosis not present

## 2020-05-07 LAB — CBC WITH DIFFERENTIAL/PLATELET
Abs Immature Granulocytes: 0.26 10*3/uL — ABNORMAL HIGH (ref 0.00–0.07)
Basophils Absolute: 0 10*3/uL (ref 0.0–0.1)
Basophils Relative: 0 %
Eosinophils Absolute: 0 10*3/uL (ref 0.0–0.5)
Eosinophils Relative: 0 %
HCT: 28.1 % — ABNORMAL LOW (ref 39.0–52.0)
Hemoglobin: 9.4 g/dL — ABNORMAL LOW (ref 13.0–17.0)
Immature Granulocytes: 3 %
Lymphocytes Relative: 6 %
Lymphs Abs: 0.5 10*3/uL — ABNORMAL LOW (ref 0.7–4.0)
MCH: 29.6 pg (ref 26.0–34.0)
MCHC: 33.5 g/dL (ref 30.0–36.0)
MCV: 88.4 fL (ref 80.0–100.0)
Monocytes Absolute: 0.3 10*3/uL (ref 0.1–1.0)
Monocytes Relative: 3 %
Neutro Abs: 7.4 10*3/uL (ref 1.7–7.7)
Neutrophils Relative %: 88 %
Platelets: 251 10*3/uL (ref 150–400)
RBC: 3.18 MIL/uL — ABNORMAL LOW (ref 4.22–5.81)
RDW: 13 % (ref 11.5–15.5)
WBC: 8.4 10*3/uL (ref 4.0–10.5)
nRBC: 0 % (ref 0.0–0.2)

## 2020-05-07 LAB — COMPREHENSIVE METABOLIC PANEL
ALT: 27 U/L (ref 0–44)
AST: 23 U/L (ref 15–41)
Albumin: 2.7 g/dL — ABNORMAL LOW (ref 3.5–5.0)
Alkaline Phosphatase: 71 U/L (ref 38–126)
Anion gap: 15 (ref 5–15)
BUN: 70 mg/dL — ABNORMAL HIGH (ref 8–23)
CO2: 22 mmol/L (ref 22–32)
Calcium: 8.2 mg/dL — ABNORMAL LOW (ref 8.9–10.3)
Chloride: 96 mmol/L — ABNORMAL LOW (ref 98–111)
Creatinine, Ser: 6.73 mg/dL — ABNORMAL HIGH (ref 0.61–1.24)
GFR, Estimated: 9 mL/min — ABNORMAL LOW (ref >60)
Glucose, Bld: 315 mg/dL — ABNORMAL HIGH (ref 70–99)
Potassium: 4.6 mmol/L (ref 3.5–5.1)
Sodium: 133 mmol/L — ABNORMAL LOW (ref 135–145)
Total Bilirubin: 0.6 mg/dL (ref 0.3–1.2)
Total Protein: 6.7 g/dL (ref 6.5–8.1)

## 2020-05-07 LAB — HEMOGLOBIN A1C
Hgb A1c MFr Bld: 6.4 % — ABNORMAL HIGH (ref 4.8–5.6)
Mean Plasma Glucose: 136.98 mg/dL

## 2020-05-07 LAB — GLUCOSE, CAPILLARY
Glucose-Capillary: 237 mg/dL — ABNORMAL HIGH (ref 70–99)
Glucose-Capillary: 241 mg/dL — ABNORMAL HIGH (ref 70–99)
Glucose-Capillary: 234 mg/dL — ABNORMAL HIGH (ref 70–99)
Glucose-Capillary: 293 mg/dL — ABNORMAL HIGH (ref 70–99)

## 2020-05-07 LAB — PHOSPHORUS: Phosphorus: 6.5 mg/dL — ABNORMAL HIGH (ref 2.5–4.6)

## 2020-05-07 LAB — C-REACTIVE PROTEIN: CRP: 19.4 mg/dL — ABNORMAL HIGH (ref <1.0)

## 2020-05-07 LAB — D-DIMER, QUANTITATIVE: D-Dimer, Quant: 1.18 ug/mL-FEU — ABNORMAL HIGH (ref 0.00–0.50)

## 2020-05-07 LAB — LACTIC ACID, PLASMA: Lactic Acid, Venous: 1.4 mmol/L (ref 0.5–1.9)

## 2020-05-07 LAB — MAGNESIUM: Magnesium: 2.1 mg/dL (ref 1.7–2.4)

## 2020-05-07 LAB — FERRITIN: Ferritin: 3712 ng/mL — ABNORMAL HIGH (ref 24–336)

## 2020-05-07 MED ORDER — INSULIN ASPART 100 UNIT/ML ~~LOC~~ SOLN: 0.0000 [IU] | SUBCUTANEOUS | Status: DC

## 2020-05-07 MED ORDER — INSULIN ASPART 100 UNIT/ML ~~LOC~~ SOLN: 10.0000 [IU] | Freq: Once | SUBCUTANEOUS | Status: DC

## 2020-05-07 MED ORDER — INSULIN ASPART 100 UNIT/ML ~~LOC~~ SOLN
5.0000 [IU] | Freq: Once | SUBCUTANEOUS | Status: AC
  Administered 2020-05-07: 5 [IU] via SUBCUTANEOUS

## 2020-05-07 MED ORDER — INSULIN ASPART 100 UNIT/ML ~~LOC~~ SOLN
0.0000 [IU] | Freq: Three times a day (TID) | SUBCUTANEOUS | Status: DC
  Administered 2020-05-07 (×2): 2 [IU] via SUBCUTANEOUS
  Administered 2020-05-08: 4 [IU] via SUBCUTANEOUS
  Administered 2020-05-08: 2 [IU] via SUBCUTANEOUS
  Administered 2020-05-10: 1 [IU] via SUBCUTANEOUS

## 2020-05-07 MED ORDER — INSULIN ASPART 100 UNIT/ML ~~LOC~~ SOLN
0.0000 [IU] | Freq: Every day | SUBCUTANEOUS | Status: DC
  Administered 2020-05-07: 3 [IU] via SUBCUTANEOUS
  Administered 2020-05-08: 4 [IU] via SUBCUTANEOUS
  Administered 2020-05-09: 5 [IU] via SUBCUTANEOUS

## 2020-05-07 NOTE — Consult Note (Signed)
[facility] Renal Consultation Note    Indication for Consultation:  Management of ESRD/hemodialysis; anemia, hypertension/volume and secondary hyperparathyroidism  HPI: [name] [name] is a 60 y.o. male with a PMH significant for HTN, DM type 2, bladder cancer s/p resection, ESRD (on HD [place] on Mon/Fri), GERD, and recent diagnosis of covid-19 who presented to [facility] ED with a 4 day history of worsening fatigue, weakness, SOB, fevers, chills, and anorexia.  In the ED he was found to be hypoxic and was placed on oxygen and admitted for treatment of symptomatic covid pneumonia.  We were consulted to help provide dialysis during his hospitalization.  He reports that he is feeling better today and only needs dialysis twice a week.  Past Medical History:  Diagnosis Date  . Allergy   . Anemia   . Anemia in chronic kidney disease (CKD) 08/12/2016  . Anxiety   . Bladder cancer ([place]) 2013  . Blood transfusion without reported diagnosis   . Coagulation defect, unspecified ([place]) 08/26/2016  . Complication of anesthesia    agitation w/awakening in 9/13,OK 01/24/12  . Depression   . Diabetes mellitus type 2, diet-controlled ([place]) PT STOPPED TAKING METFORMIN--  HAS BEEN WATCHING DIET, EXERCISING AND LOSING WT  . ESRD (end stage renal disease) ([place]) 08/11/2016  . Frequency of urination   . GERD (gastroesophageal reflux disease)   . Gout    recent flair -bilateral feet--  STABLE  . Hyperkalemia 08/12/2016  . Hypertension   . Hypertensive kidney disease with CKD (chronic kidney disease)   . Nocturia   . Non-compliance with renal dialysis ([place])   . Secondary hyperparathyroidism of renal origin ([place]) 08/12/2016  . Urgency incontinence    Past Surgical History:  Procedure Laterality Date  . AV FISTULA PLACEMENT Left 05/31/2014   Procedure: LEFT RADIOCEPHALIC ARTERIOVENOUS (AV) FISTULA CREATION ;  Surgeon: Mal Misty, MD;  Location: [facility];  Service: Vascular;  Laterality: Left;   . BACK SURGERY    . BLADDER SURGERY  04/26/2013   at the cancer treatment center of [place] in  [place]    . COLONOSCOPY    . CYSTOSCOPY W/ RETROGRADES Bilateral 07/10/2012   Procedure: CYSTOSCOPY WITH RETROGRADE PYELOGRAM;  Surgeon: Molli Hazard, MD;  Location: [facility];  Service: Urology;  Laterality: Bilateral;  . CYSTOSCOPY WITH URETHRAL DILATATION  01/24/2012   Procedure: CYSTOSCOPY WITH URETHRAL DILATATION;  Surgeon: Molli Hazard, MD;  Location: [facility];  Service: Urology;  Laterality: N/A;  . IR NEPHROSTOMY PLACEMENT LEFT  07/16/2016  . LUMBAR LAMINECTOMY  06-02-2005   W/ RESECTION NERVE ROOT  L4  -  L5  . NEPHROSTOMY TUBE PLACEMENT ([place] HX)  2015   right   . TRANSURETHRAL RESECTION OF BLADDER TUMOR  12/15/2011   Procedure: TRANSURETHRAL RESECTION OF BLADDER TUMOR (TURBT);  Surgeon: Molli Hazard, MD;  Location: [facility];  Service: Urology;  Laterality: N/A;  2 HRS   . TRANSURETHRAL RESECTION OF BLADDER TUMOR  01/24/2012   Procedure: TRANSURETHRAL RESECTION OF BLADDER TUMOR (TURBT);  Surgeon: Molli Hazard, MD;  Location: [facility];  Service: Urology;  Laterality: N/A;  90 MIN   . TRANSURETHRAL RESECTION OF BLADDER TUMOR N/A 07/10/2012   Procedure: TRANSURETHRAL RESECTION OF BLADDER TUMOR (TURBT);  Surgeon: Molli Hazard, MD;  Location: [facility];  Service: Urology;  Laterality: N/A;  . ulner artery repair Right 2009   rt -trauma /thrombectomy,repair   Family  History:   Family History  Problem Relation Age of Onset  . Heart disease Mother   . Hypertension Other   . Diabetes Maternal Grandmother   . Brain cancer Paternal Uncle   . Bladder Cancer Neg Hx   . Colon cancer Neg Hx   . Esophageal cancer Neg Hx   . Rectal cancer Neg Hx   . Stomach cancer Neg Hx    Social History:  reports that he quit smoking about 28 years ago. His smoking use included  cigarettes. He has a 16.00 pack-year smoking history. He quit smokeless tobacco use about 28 years ago.  His smokeless tobacco use included chew. He reports that he does not drink alcohol and does not use drugs. Allergies  Allergen Reactions  . Lisinopril Cough   Prior to Admission medications   Medication Sig Start Date End Date Taking? Authorizing Provider  amLODipine (NORVASC) 10 MG tablet Take 10 mg by mouth daily. 03/22/19  Yes [provider]  AURYXIA 1 GM 210 MG(Fe) tablet Take 420 mg by mouth daily.  04/14/18  Yes [provider]  cinacalcet (SENSIPAR) 30 MG tablet Take 30 mg by mouth every Monday, Wednesday, and Friday.   Yes [provider]  pantoprazole (PROTONIX) 40 MG tablet Take 40 mg by mouth 2 (two) times daily. 05/11/14  Yes [provider]  sodium bicarbonate 650 MG tablet Take 650 mg by mouth 2 (two) times daily.   Yes [provider]  venlafaxine XR (EFFEXOR-XR) 150 MG 24 hr capsule Take 1 capsule (150 mg total) by mouth daily with breakfast. 07/06/17  Yes Eappen, Saramma, MD  rosuvastatin (CRESTOR) 5 MG tablet Take 5 mg by mouth daily. Patient not taking: Reported on 05/06/2020 05/26/19   [provider]  sevelamer carbonate (RENVELA) 800 MG tablet Take 800 mg by mouth 3 (three) times daily.  Patient not taking: Reported on 05/06/2020 08/11/16   [provider]  QUEtiapine (SEROQUEL) 25 MG tablet Take 1 tablet (25 mg total) by mouth at bedtime. Patient not taking: Reported on 05/04/2018 07/06/17 05/27/19  Ursula Alert, MD   Current Facility-Administered Medications  Medication Dose Route Frequency Provider Last Rate Last Admin  . acetaminophen (TYLENOL) tablet 650 mg  650 mg Oral Q6H PRN Seawell, Jaimie A, DO      . chlorpheniramine-HYDROcodone (TUSSIONEX) 10-8 MG/5ML suspension 5 mL  5 mL Oral Q12H PRN Seawell, Jaimie A, DO      . cinacalcet (SENSIPAR) tablet 30 mg  30 mg Oral Q lunch Seawell, Jaimie A, DO      .  dexamethasone (DECADRON) tablet 6 mg  6 mg Oral Q24H Seawell, Jaimie A, DO   6 mg at 05/07/20 0847  . guaiFENesin-dextromethorphan (ROBITUSSIN DM) 100-10 MG/5ML syrup 10 mL  10 mL Oral Q4H PRN Seawell, Jaimie A, DO      . heparin injection 5,000 Units  5,000 Units Subcutaneous Q8H Seawell, Jaimie A, DO   5,000 Units at 05/07/20 0535  . insulin aspart (novoLOG) injection 0-5 Units  0-5 Units Subcutaneous QHS [name], DO      . insulin aspart (novoLOG) injection 0-6 Units  0-6 Units Subcutaneous TID WC [name], DO      . ondansetron [facility]) tablet 4 mg  4 mg Oral Q6H PRN Seawell, Jaimie A, DO       Or  . ondansetron (ZOFRAN) injection 4 mg  4 mg Intravenous Q6H PRN Seawell, Jaimie A, DO      . pantoprazole (PROTONIX)  EC tablet 80 mg  80 mg Oral Daily Seawell, Jaimie A, DO   80 mg at 05/07/20 0847  . remdesivir 100 mg in sodium chloride 0.9 % 100 mL IVPB  100 mg Intravenous Daily Seawell, Jaimie A, DO 200 mL/hr at 05/07/20 0855 100 mg at 05/07/20 0855  . rosuvastatin (CRESTOR) tablet 5 mg  5 mg Oral Daily Seawell, Jaimie A, DO   5 mg at 05/07/20 0848  . sevelamer carbonate (RENVELA) tablet 800 mg  800 mg Oral TID with meals Seawell, Jaimie A, DO   800 mg at 05/07/20 0850  . sodium bicarbonate tablet 650 mg  650 mg Oral BID Seawell, Jaimie A, DO   650 mg at 05/07/20 0848  . venlafaxine XR (EFFEXOR-XR) 24 hr capsule 150 mg  150 mg Oral Q breakfast Seawell, Jaimie A, DO   150 mg at 05/07/20 0848   Labs: Basic Metabolic Panel: Recent Labs  Lab 05/06/20 0924 05/07/20 0055  NA 132* 133*  K 3.9 4.6  CL 92* 96*  CO2 22 22  GLUCOSE 155* 315*  BUN 51* 70*  CREATININE 6.13* 6.73*  CALCIUM 8.5* 8.2*  PHOS  --  6.5*   Liver Function Tests: Recent Labs  Lab 05/06/20 0924 05/07/20 0055  AST 22 23  ALT 25 27  ALKPHOS 73 71  BILITOT 0.7 0.6  PROT 7.0 6.7  ALBUMIN 2.9* 2.7*   No results for input(s): LIPASE, AMYLASE in the last 168 hours. No results for input(s): AMMONIA in the last  168 hours. CBC: Recent Labs  Lab 05/06/20 0924 05/07/20 0055  WBC 9.8 8.4  NEUTROABS 8.4* 7.4  HGB 9.6* 9.4*  HCT 28.9* 28.1*  MCV 88.7 88.4  PLT 253 251   Cardiac Enzymes: No results for input(s): CKTOTAL, CKMB, CKMBINDEX, TROPONINI in the last 168 hours. CBG: Recent Labs  Lab 05/07/20 0801  GLUCAP 237*   Iron Studies:  Recent Labs    05/07/20 0055  FERRITIN 3,712*   Studies/Results: DG Chest Port 1 View  Result Date: 05/06/2020 CLINICAL DATA:  Shortness of breath.  COVID. EXAM: PORTABLE CHEST 1 VIEW COMPARISON:  09/13/2017 FINDINGS: Patchy bilateral pneumonia. Lung volumes are low with accentuated heart size. No effusion or pneumothorax. IMPRESSION: Atypical pneumonia pattern. Electronically Signed   By: Monte Fantasia M.D.   On: 05/06/2020 10:04    ROS: Pertinent items are noted in HPI. Physical Exam: Vitals:   05/06/20 1946 05/06/20 2325 05/07/20 0355 05/07/20 0733  BP: 138/72 (!) 154/78 (!) 150/80 133/71  Pulse: 89 88 98 94  Resp: (!) 22 19 20 20   Temp: 97.8 F (36.6 C) 98 F (36.7 C) 97.6 F (36.4 C) (!) 97.2 F (36.2 C)  TempSrc: Axillary Axillary Axillary Oral  SpO2: 91% 91% 90% 93%  Weight:      Height:          Weight change:   Intake/Output Summary (Last 24 hours) at 05/07/2020 1142 Last data filed at 05/07/2020 2355 Gross per 24 hour  Intake 2240 ml  Output --  Net 2240 ml   BP 133/71 (BP Location: Right Arm)   Pulse 94   Temp (!) 97.2 F (36.2 C) (Oral)   Resp 20   Ht 5\' 10"  (1.778 m)   Wt 88 kg   SpO2 93%   BMI 27.84 kg/m  General appearance: cooperative, no distress and slowed mentation Head: Normocephalic, without obvious abnormality, atraumatic Resp: diminished breath sounds and poor inspiratory effort Cardio: regular rate and rhythm,  S1, S2 normal, no murmur, click, rub or gallop GI: soft, non-tender; bowel sounds normal; no masses,  no organomegaly  Extremities: extremities normal, atraumatic, no cyanosis or edema and LUE AVF  +T/B Dialysis Access: LUE AVF  Dialysis Orders: Center: [facility]  on Monday/Friday schedule . EDW 85kg HD [place] 2K/3.5Ca  Time 4 hours Heparin none. Access LUE AVF BFR 400 DFR 800    Micera 100 mcg IVP every 2 weeks   Assessment/Plan: 1.  Pneumonia due to covid-19 virus - started on IV decadron and remdesivir per primary svc due to elevated inflammatory markers and worsening SOB.  Feels better today.   2.  ESRD -  Only requires dialysis twice a week, however may require more frequent dialysis since he is now on decadron.  Will follow for now and plan for HD on Friday. 3.  Hypertension/volume  - appears volume depleted.  Encourage po and follow 4.  Anemia  -  Stable, will dose aranesp with HD on Friday  5.  Metabolic bone disease -  Continue with outpatient meds 6.  Nutrition -  Renal diet  Donetta Potts, MD [place] Pager [phone] 05/07/2020, 11:42 AM

## 2020-05-07 NOTE — Progress Notes (Signed)
Subjective:   Hospital day: 1  Overnight event: No OV event  Patient is seen at bedside.  He is comfortable with no acute respiratory distress. States that he feels better after he was given fluid in the ED. He states that his breathing is better. He denies chest pain, abdominal pain, nausea, vomiting, diarrhea.  States that he has been eating and drinking well compared to last week.  Patient states that his daughter got Covid last month and had to stay in the hospital for 1 week.  He requests an incentive spirometry because it helped his daughter.  Patient states that he has been going to his dialysis once a week instead of twice a week.  States that his weights have been below his dry weights and he is not on a fluid restriction.  Objective:  Vital signs in last 24 hours: Vitals:   05/06/20 1838 05/06/20 1946 05/06/20 2325 05/07/20 0355  BP: (!) 169/79 138/72 (!) 154/78 (!) 150/80  Pulse: 90 89 88 98  Resp: 20 (!) 22 19 20   Temp: 98.2 F (36.8 C) 97.8 F (36.6 C) 98 F (36.7 C) 97.6 F (36.4 C)  TempSrc: Oral Axillary Axillary Axillary  SpO2: 98% 91% 91% 90%  Weight:      Height:       CBC Latest Ref Rng & Units 05/07/2020 05/06/2020 05/28/2019  WBC 4.0 - 10.5 K/uL 8.4 9.8 7.8  Hemoglobin 13.0 - 17.0 g/dL 9.4(L) 9.6(L) 10.9(L)  Hematocrit 39.0 - 52.0 % 28.1(L) 28.9(L) 32.8(L)  Platelets 150 - 400 K/uL 251 253 178   CMP Latest Ref Rng & Units 05/07/2020 05/06/2020 05/28/2019  Glucose 70 - 99 mg/dL 315(H) 155(H) 292(H)  BUN 8 - 23 mg/dL 70(H) 51(H) 38(H)  Creatinine 0.61 - 1.24 mg/dL 6.73(H) 6.13(H) 4.53(H)  Sodium 135 - 145 mmol/L 133(L) 132(L) 135  Potassium 3.5 - 5.1 mmol/L 4.6 3.9 4.7  Chloride 98 - 111 mmol/L 96(L) 92(L) 96(L)  CO2 22 - 32 mmol/L 22 22 24   Calcium 8.9 - 10.3 mg/dL 8.2(L) 8.5(L) 8.4(L)  Total Protein 6.5 - 8.1 g/dL 6.7 7.0 -  Total Bilirubin 0.3 - 1.2 mg/dL 0.6 0.7 -  Alkaline Phos 38 - 126 U/L 71 73 -  AST 15 - 41 U/L 23 22 -  ALT 0 - 44 U/L 27 25 -     Physical Exam  Physical Exam Constitutional:      General: He is not in acute distress. HENT:     Head: Normocephalic.  Eyes:     General:        Right eye: No discharge.        Left eye: No discharge.  Cardiovascular:     Rate and Rhythm: Normal rate and regular rhythm.  Pulmonary:     Effort: Pulmonary effort is normal. No respiratory distress.     Comments: Mild crackle at right side Abdominal:     General: Bowel sounds are normal.     Tenderness: There is no abdominal tenderness.  Musculoskeletal:     Cervical back: Normal range of motion.     Right lower leg: No edema.     Left lower leg: No edema.  Skin:    General: Skin is warm.  Neurological:     Mental Status: He is alert.  Psychiatric:        Mood and Affect: Mood normal.     Assessment/Plan:  [name] [name] is a 60yo male with PMH of bladder cancer  and renal cancer s/p resection, ESRD on HD MF, severe GERD, and depression presenting with weakness since being diagnosed with covid five days ago.  Active Problems:   Pneumonia due to COVID-19 virus  Covid-19 Pneumonia-diagnosed 1/27 Patient appears well on exam and only required 2 L of oxygen.  Given his significantly elevated inflammatory markers, will keep him inpatient and continue remdesivir and Decadron.  Continue to trend inflammatory markers.  Incentive spirometry ordered.  - remdesivir 5 days - decadron 10 days  - prn robitussen  - trend inflammatory markers  - supplemental O2, keep sats >90%   ESRD on HD Metabolic acidosis Nephrology is on board.  Plan for hemodialysis on Friday.  Encourage p.o. intake - cont. renvela - cont. Sodium bicarb 650 bid   Type 2 diabetes A1c of 6.4.  Blood glucose 315 this morning likely due to Decadron.  Will have patient on sliding scale insulin with meals with nighttime correction.  Patient will need to follow-up with PCP outpatient to start him on a p.o. regiment for his diabetes. -Sliding scale  insulin -CBG   Normocytic anemia Hemoglobin of 9.4 which is stable.  Likely due to anemia of chronic disease. Patient will receive Aranesp with HD on Friday per nephrology  Diet: Regular diet IVF: N/A VTE: Heparin CODE: DNR  Prior to Admission Living Arrangement: Home Anticipated Discharge Location: Home Barriers to Discharge: Oxygen requirement for COVID-19 infection  [name], DO 05/07/2020, 7:26 AM Pager: [phone] After 5pm on weekdays and 1pm on weekends: On Call pager [phone]

## 2020-05-08 LAB — CBC WITH DIFFERENTIAL/PLATELET
Abs Immature Granulocytes: 0.5 10*3/uL — ABNORMAL HIGH (ref 0.00–0.07)
Basophils Absolute: 0 10*3/uL (ref 0.0–0.1)
Basophils Relative: 0 %
Eosinophils Absolute: 0 10*3/uL (ref 0.0–0.5)
Eosinophils Relative: 0 %
HCT: 23.5 % — ABNORMAL LOW (ref 39.0–52.0)
Hemoglobin: 8.5 g/dL — ABNORMAL LOW (ref 13.0–17.0)
Immature Granulocytes: 3 %
Lymphocytes Relative: 4 %
Lymphs Abs: 0.6 10*3/uL — ABNORMAL LOW (ref 0.7–4.0)
MCH: 30.7 pg (ref 26.0–34.0)
MCHC: 36.2 g/dL — ABNORMAL HIGH (ref 30.0–36.0)
MCV: 84.8 fL (ref 80.0–100.0)
Monocytes Absolute: 0.7 10*3/uL (ref 0.1–1.0)
Monocytes Relative: 5 %
Neutro Abs: 12.9 10*3/uL — ABNORMAL HIGH (ref 1.7–7.7)
Neutrophils Relative %: 88 %
Platelets: 240 10*3/uL (ref 150–400)
RBC: 2.77 MIL/uL — ABNORMAL LOW (ref 4.22–5.81)
RDW: 12.7 % (ref 11.5–15.5)
WBC: 14.7 10*3/uL — ABNORMAL HIGH (ref 4.0–10.5)
nRBC: 0 % (ref 0.0–0.2)

## 2020-05-08 LAB — COMPREHENSIVE METABOLIC PANEL
ALT: 23 U/L (ref 0–44)
AST: 14 U/L — ABNORMAL LOW (ref 15–41)
Albumin: 2.5 g/dL — ABNORMAL LOW (ref 3.5–5.0)
Alkaline Phosphatase: 59 U/L (ref 38–126)
Anion gap: 17 — ABNORMAL HIGH (ref 5–15)
BUN: 107 mg/dL — ABNORMAL HIGH (ref 8–23)
CO2: 17 mmol/L — ABNORMAL LOW (ref 22–32)
Calcium: 8 mg/dL — ABNORMAL LOW (ref 8.9–10.3)
Chloride: 96 mmol/L — ABNORMAL LOW (ref 98–111)
Creatinine, Ser: 7.15 mg/dL — ABNORMAL HIGH (ref 0.61–1.24)
GFR, Estimated: 8 mL/min — ABNORMAL LOW (ref >60)
Glucose, Bld: 242 mg/dL — ABNORMAL HIGH (ref 70–99)
Potassium: 4.7 mmol/L (ref 3.5–5.1)
Sodium: 130 mmol/L — ABNORMAL LOW (ref 135–145)
Total Bilirubin: 0.3 mg/dL (ref 0.3–1.2)
Total Protein: 6.1 g/dL — ABNORMAL LOW (ref 6.5–8.1)

## 2020-05-08 LAB — GLUCOSE, CAPILLARY
Glucose-Capillary: 133 mg/dL — ABNORMAL HIGH (ref 70–99)
Glucose-Capillary: 258 mg/dL — ABNORMAL HIGH (ref 70–99)
Glucose-Capillary: 318 mg/dL — ABNORMAL HIGH (ref 70–99)
Glucose-Capillary: 337 mg/dL — ABNORMAL HIGH (ref 70–99)

## 2020-05-08 LAB — FERRITIN: Ferritin: 3129 ng/mL — ABNORMAL HIGH (ref 24–336)

## 2020-05-08 LAB — C-REACTIVE PROTEIN: CRP: 10.3 mg/dL — ABNORMAL HIGH (ref <1.0)

## 2020-05-08 LAB — D-DIMER, QUANTITATIVE: D-Dimer, Quant: 0.9 ug/mL-FEU — ABNORMAL HIGH (ref 0.00–0.50)

## 2020-05-08 MED ORDER — INSULIN GLARGINE 100 UNIT/ML ~~LOC~~ SOLN
5.0000 [IU] | Freq: Every day | SUBCUTANEOUS | Status: DC
  Administered 2020-05-08 – 2020-05-09 (×2): 5 [IU] via SUBCUTANEOUS
  Filled 2020-05-08 (×3): qty 0.05

## 2020-05-08 NOTE — Progress Notes (Signed)
Subjective:   Hospital day: 2  Overnight event: None  This morning, Mr [name] [name] was evaluated at bedside. He is sitting up at bedside chair. He notes feeling well this morning and denies any shortness of breath at this time. He endorses doing well with getting up from bed to chair and using the restroom. Currently on 3L with SpO2>90%. Patient denies chest pain or N/V.  Objective:  Vital signs in last 24 hours: Vitals:   05/07/20 1947 05/08/20 0000 05/08/20 0337 05/08/20 0729  BP: 137/70 (!) 147/69 (!) 154/82 (!) 151/72  Pulse: 98 87 82 89  Resp: 17 19 20 17   Temp: 98.1 F (36.7 C) 98 F (36.7 C) 97.9 F (36.6 C) 97.6 F (36.4 C)  TempSrc: Axillary Axillary Axillary Oral  SpO2: 90% 93% 92% (!) 88%  Weight:      Height:        Physical Exam  Physical Exam Constitutional:      General: He is not in acute distress.    Appearance: Normal appearance.  HENT:     Head: Normocephalic.  Eyes:     General:        Right eye: No discharge.        Left eye: No discharge.  Cardiovascular:     Rate and Rhythm: Normal rate and regular rhythm.  Pulmonary:     Effort: Pulmonary effort is normal. No respiratory distress.     Breath sounds: Normal breath sounds.  Abdominal:     General: Bowel sounds are normal.  Musculoskeletal:     Right lower leg: No edema.     Left lower leg: No edema.  Skin:    General: Skin is warm.     Coloration: Skin is not jaundiced.  Neurological:     Mental Status: He is alert.  Psychiatric:        Mood and Affect: Mood normal.     Assessment/Plan: ABRAHM [name] is a 60yo male with PMH of bladder cancer and renal cancer s/p resection, ESRD on HD MF, severe GERD, and depression presenting with weakness secondary to Covid 19 infection   Active Problems:   Pneumonia due to COVID-19 virus  Covid-19 Pneumonia-diagnosed 1/27 Patient appears well on exam but require high oxygen overnight.  Currently satting in the low 90s on 3 L.  WBC  elevated but his inflammatory markers are trending down which are reassuring.  We will continue remdesivir and Decadron.  Was also have physical therapy to work with him today to assess his oxygen requirement with ambulation.  Patient is likely to remain inpatient for another day and have dialysis tomorrow. - remdesivir 2/5 days - decadron 2/10 days  - prn robitussen  - trend inflammatory markers  - supplemental O2, keep sats >90%   ESRD on HD Metabolic acidosis Nephrology is on board. Kidney function slightly worsened today with creatinine of 7.15 and BUN of 107.  Plan to have dialysis tomorrow. - cont. renvela - cont. Sodium bicarb 650 bid   Type 2 diabetes A1c of 6.4.  Blood sugar in the 200s yesterday and patient received total of 12 units of NovoLog.  We will add 5 units of Lantus at bedtime. -Lantus 5 unit at bedtime -Sliding scale insulin -CBG   Normocytic anemia Likely due to anemia of chronic disease. Patient will receive Aranesp with HD on Friday per nephrology  Diet: Regular diet IVF: N/A VTE: Heparin CODE: DNR  Prior to Admission Living Arrangement: Home Anticipated Discharge  Location: Home Barriers to Discharge: Oxygen requirement for COVID-19 infection  [name] Gerold, DO 05/08/2020, 10:42 AM Pager: [phone] After 5pm on weekdays and 1pm on weekends: On Call pager [phone]

## 2020-05-08 NOTE — Progress Notes (Signed)
Patient ID: [name] [name], male   DOB: 1961/03/07, 60 y.o.   MRN: [id number] S: Feels better today. O:BP (!) 150/71 (BP Location: Right Arm)   Pulse 79   Temp 97.8 F (36.6 C) (Oral)   Resp 20   Ht 5\' 10"  (1.778 m)   Wt 88 kg   SpO2 96%   BMI 27.84 kg/m   Intake/Output Summary (Last 24 hours) at 05/08/2020 1155 Last data filed at 05/08/2020 0535 Gross per 24 hour  Intake 300 ml  Output 400 ml  Net -100 ml   Intake/Output: I/O last 3 completed shifts: In: 1540 [P.O.:1540] Out: 400 [Urine:400]  Intake/Output this shift:  No intake/output data recorded. Weight change:  Gen: NAD CVS: RRR Resp: cta Abd: benign Ext: no edema, LAVF +T/B  Recent Labs  Lab 05/06/20 0924 05/07/20 0055 05/08/20 0110  NA 132* 133* 130*  K 3.9 4.6 4.7  CL 92* 96* 96*  CO2 22 22 17*  GLUCOSE 155* 315* 242*  BUN 51* 70* 107*  CREATININE 6.13* 6.73* 7.15*  ALBUMIN 2.9* 2.7* 2.5*  CALCIUM 8.5* 8.2* 8.0*  PHOS  --  6.5*  --   AST 22 23 14*  ALT 25 27 23    Liver Function Tests: Recent Labs  Lab 05/06/20 0924 05/07/20 0055 05/08/20 0110  AST 22 23 14*  ALT 25 27 23   ALKPHOS 73 71 59  BILITOT 0.7 0.6 0.3  PROT 7.0 6.7 6.1*  ALBUMIN 2.9* 2.7* 2.5*   No results for input(s): LIPASE, AMYLASE in the last 168 hours. No results for input(s): AMMONIA in the last 168 hours. CBC: Recent Labs  Lab 05/06/20 0924 05/07/20 0055 05/08/20 0110  WBC 9.8 8.4 14.7*  NEUTROABS 8.4* 7.4 12.9*  HGB 9.6* 9.4* 8.5*  HCT 28.9* 28.1* 23.5*  MCV 88.7 88.4 84.8  PLT 253 251 240   Cardiac Enzymes: No results for input(s): CKTOTAL, CKMB, CKMBINDEX, TROPONINI in the last 168 hours. CBG: Recent Labs  Lab 05/07/20 0801 05/07/20 1203 05/07/20 1652 05/07/20 2026 05/08/20 0728  GLUCAP 237* 241* 234* 293* 133*    Iron Studies:  Recent Labs    05/08/20 0110  FERRITIN 3,129*   Studies/Results: No results found. . cinacalcet  30 mg Oral Q lunch  . dexamethasone  6 mg Oral Q24H  . heparin   5,000 Units Subcutaneous Q8H  . insulin aspart  0-5 Units Subcutaneous QHS  . insulin aspart  0-6 Units Subcutaneous TID WC  . insulin glargine  5 Units Subcutaneous QHS  . pantoprazole  80 mg Oral Daily  . rosuvastatin  5 mg Oral Daily  . sevelamer carbonate  800 mg Oral TID with meals  . sodium bicarbonate  650 mg Oral BID  . venlafaxine XR  150 mg Oral Q breakfast    BMET    Component Value Date/Time   NA 130 (L) 05/08/2020 0110   K 4.7 05/08/2020 0110   CL 96 (L) 05/08/2020 0110   CO2 17 (L) 05/08/2020 0110   GLUCOSE 242 (H) 05/08/2020 0110   BUN 107 (H) 05/08/2020 0110   CREATININE 7.15 (H) 05/08/2020 0110   CREATININE 2.53 (H) 08/29/2013 1947   CALCIUM 8.0 (L) 05/08/2020 0110   CALCIUM 8.9 09/29/2015 1143   GFRNONAA 8 (L) 05/08/2020 0110   GFRAA 15 (L) 05/28/2019 0843   CBC    Component Value Date/Time   WBC 14.7 (H) 05/08/2020 0110   RBC 2.77 (L) 05/08/2020 0110   HGB 8.5 (L) 05/08/2020  0110   HCT 23.5 (L) 05/08/2020 0110   PLT 240 05/08/2020 0110   MCV 84.8 05/08/2020 0110   MCV 83.3 08/29/2013 1947   MCH 30.7 05/08/2020 0110   MCHC 36.2 (H) 05/08/2020 0110   RDW 12.7 05/08/2020 0110   LYMPHSABS 0.6 (L) 05/08/2020 0110   MONOABS 0.7 05/08/2020 0110   EOSABS 0.0 05/08/2020 0110   BASOSABS 0.0 05/08/2020 0110    Dialysis Orders: Center: [facility]  on Monday/Friday schedule . EDW 85kg HD Bath 2K/3.5Ca  Time 4 hours Heparin none. Access LUE AVF BFR 400 DFR 800    Micera 100 mcg IVP every 2 weeks   Assessment/Plan: 1.  Pneumonia due to covid-19 virus - started on IV decadron and remdesivir per primary svc due to elevated inflammatory markers and worsening SOB.  Feels better today.   2.  ESRD -  Only requires dialysis twice a week, however may require more frequent dialysis since he is now on decadron.  Will follow for now and plan for HD on Friday. 3.  Hypertension/volume  - appears volume depleted.  Encourage po and follow 4.  Anemia  -  Stable, will dose  aranesp with HD on Friday  5.  Metabolic bone disease -  Continue with outpatient meds 6.  Nutrition -  Renal diet   Donetta Potts, MD [facility] [phone]

## 2020-05-08 NOTE — Progress Notes (Signed)
Inpatient Diabetes Program Recommendations  AACE/ADA: New Consensus Statement on Inpatient Glycemic Control (2015)  Target Ranges:  Prepandial:   less than 140 mg/dL      Peak postprandial:   less than 180 mg/dL (1-2 hours)      Critically ill patients:  140 - 180 mg/dL   Lab Results  Component Value Date   GLUCAP 258 (H) 05/08/2020   HGBA1C 6.4 (H) 05/07/2020    Review of Glycemic Control Results for [name], [name] (MRN [id number]) as of 05/08/2020 12:24  Ref. Range 05/07/2020 08:01 05/07/2020 12:03 05/07/2020 16:52 05/07/2020 20:26 05/08/2020 07:28 05/08/2020 12:00  Glucose-Capillary Latest Ref Range: 70 - 99 mg/dL 237 (H) 241 (H) 234 (H) 293 (H) 133 (H) 258 (H)    Current orders for Inpatient glycemic control:  Lantus 5 units QHS Novolog 0-6 units tid & 0-5 qhs Decadron 6 mg daily  Inpatient Diabetes Program Recommendations:    If post prandials remain elevated and steroids continue please consider;  Novolog 3 units TID with meals if eats at least 50%  Will continue to follow while inpatient.  Thank you, Reche Dixon, RN, BSN Diabetes Coordinator Inpatient Diabetes Program [phone] (team pager from 8a-5p)

## 2020-05-08 NOTE — Progress Notes (Signed)
SATURATION QUALIFICATIONS: (This note is used to comply with regulatory documentation for home oxygen)  Patient Saturations on Room Air at Rest = 90%  Patient Saturations on Room Air while Ambulating = 82%  Patient Saturations on 4 Liters of oxygen while Ambulating = 90%  Please briefly explain why patient needs home oxygen:  Pt requires 4L O2 via [place] to maintain SaO2 90%O2 with ambulation.  Lesleyann Fichter B. Migdalia Dk PT, DPT Acute Rehabilitation Services Pager [phone] Office [phone]

## 2020-05-08 NOTE — Evaluation (Signed)
Physical Therapy Evaluation Patient Details Name: [name] [name] MRN: [id number] DOB: Apr 09, 1960 Today's Date: 05/08/2020   History of Present Illness  60yo male with PMH of bladder cancer and renal cancer s/p resection, ESRD on HD MWF, severe GERD, and depression presenting with weakness, SOB, fever and chills.  since being diagnosed with covid five days earlier. In ED 05/06/20 pt requiring 2L O2 via [place] to maintain O2 saturation. Admitted for treatment of COVID-19 PNA.  Clinical Impression  PTA pt living with wife and children in home with bed and bath on first floor and 5 steps to enter. Pt independent, owns his own company. Pt is up in recliner on entry. Pt is mod I for transfers and supervision for ambulation to bathroom and then out in hallway without AD. Pt requires 4L O2 via [place] to maintain SaO2 >90%O2. Pt is notably fatigued with ambulation however does not require any physical assist. PT recommending d/c home with intermittent supervision at discharge for safety. PT will continue to follow acutely.     Follow Up Recommendations No PT follow up;Supervision - Intermittent    Equipment Recommendations  None recommended by PT       Precautions / Restrictions Precautions Precautions: None Restrictions Weight Bearing Restrictions: No      Mobility  Bed Mobility               General bed mobility comments: up in recliner on entry    Transfers Overall transfer level: Modified independent Equipment used: None Transfers: Sit to/from Stand Sit to Stand: Modified independent (Device/Increase time)         General transfer comment: increased effort to steady in standing reached out for bed rail to steady  Ambulation/Gait Ambulation/Gait assistance: Supervision Gait Distance (Feet): 150 Feet Assistive device:  (pushed O2 tank) Gait Pattern/deviations: Step-through pattern;WFL(Within Functional Limits) Gait velocity: variable Gait velocity interpretation: <1.8 ft/sec,  indicate of risk for recurrent falls General Gait Details: supervision for safety, gait speed decreased with distance and fatigue         Balance Overall balance assessment: Mild deficits observed, not formally tested                                           Pertinent Vitals/Pain Pain Assessment: No/denies pain    Home Living Family/patient expects to be discharged to:: Private residence Living Arrangements: Spouse/significant other Available Help at Discharge: Family;Available 24 hours/day Type of Home: House Home Access: Stairs to enter   [employer] of Steps: 3 Home Layout: Two level;Able to live on main level with bedroom/bathroom Home Equipment: None      Prior Function Level of Independence: Independent         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     Hand Dominance        Extremity/Trunk Assessment   Upper Extremity Assessment Upper Extremity Assessment: Generalized weakness    Lower Extremity Assessment Lower Extremity Assessment: Generalized weakness       Communication   Communication: No difficulties  Cognition Arousal/Alertness: Awake/alert Behavior During Therapy: WFL for tasks assessed/performed Overall Cognitive Status: Within Functional Limits for tasks assessed                                        General Comments General comments (skin  integrity, edema, etc.): Pt on 2L O2 via [place] on entry with SaO2 97% O2, trialed RA and SaO2 dropped to 90%O2 with ambulation dropped to 83%O2, Pt ultimately required 4L O2 via [place] to maintain SaO2 >90%O2    Exercises Other Exercises Other Exercises: IS x5 max inhalation 1240mL (cues for slowed inhale, pt with lightheadedness after 5 inhalations, suggested 10 per hour but spaced out)   Assessment/Plan    PT Assessment Patient needs continued PT services  PT Problem List Decreased strength;Decreased activity tolerance;Cardiopulmonary status limiting activity        PT Treatment Interventions DME instruction;Gait training;Stair training;Functional mobility training;Therapeutic activities;Therapeutic exercise;Balance training;Cognitive remediation;Patient/family education    PT Goals (Current goals can be found in the Care Plan section)  Acute Rehab PT Goals Patient Stated Goal: go home and feel better PT Goal Formulation: With patient Time For Goal Achievement: 05/22/20 Potential to Achieve Goals: Good    Frequency Min 3X/week    AM-PAC PT "6 Clicks" Mobility  Outcome Measure Help needed turning from your back to your side while in a flat bed without using bedrails?: None Help needed moving from lying on your back to sitting on the side of a flat bed without using bedrails?: None Help needed moving to and from a bed to a chair (including a wheelchair)?: None Help needed standing up from a chair using your arms (e.g., wheelchair or bedside chair)?: None Help needed to walk in hospital room?: None Help needed climbing 3-5 steps with a railing? : A Little 6 Click Score: 23    End of Session Equipment Utilized During Treatment: Oxygen Activity Tolerance: Patient tolerated treatment well Patient left: in chair;with call bell/phone within reach Nurse Communication: Mobility status;Other (comment) (O2 needed) PT Visit Diagnosis: Muscle weakness (generalized) (M62.81);Difficulty in walking, not elsewhere classified (R26.2)    Time: 0981-1914 PT Time Calculation (min) (ACUTE ONLY): 24 min   Charges:   PT Evaluation $PT Eval Moderate Complexity: 1 Mod PT Treatments $Therapeutic Exercise: 8-22 mins        Reba Hulett B. Migdalia Dk PT, DPT Acute Rehabilitation Services Pager [phone] Office [phone]   [place] 05/08/2020, 4:03 PM

## 2020-05-09 LAB — CBC WITH DIFFERENTIAL/PLATELET
Abs Immature Granulocytes: 1.53 10*3/uL — ABNORMAL HIGH (ref 0.00–0.07)
Basophils Absolute: 0.1 10*3/uL (ref 0.0–0.1)
Basophils Relative: 0 %
Eosinophils Absolute: 0 10*3/uL (ref 0.0–0.5)
Eosinophils Relative: 0 %
HCT: 26.4 % — ABNORMAL LOW (ref 39.0–52.0)
Hemoglobin: 9.5 g/dL — ABNORMAL LOW (ref 13.0–17.0)
Immature Granulocytes: 8 %
Lymphocytes Relative: 5 %
Lymphs Abs: 0.8 10*3/uL (ref 0.7–4.0)
MCH: 30.5 pg (ref 26.0–34.0)
MCHC: 36 g/dL (ref 30.0–36.0)
MCV: 84.9 fL (ref 80.0–100.0)
Monocytes Absolute: 1 10*3/uL (ref 0.1–1.0)
Monocytes Relative: 5 %
Neutro Abs: 14.7 10*3/uL — ABNORMAL HIGH (ref 1.7–7.7)
Neutrophils Relative %: 82 %
Platelets: 307 10*3/uL (ref 150–400)
RBC: 3.11 MIL/uL — ABNORMAL LOW (ref 4.22–5.81)
RDW: 12.8 % (ref 11.5–15.5)
WBC: 18.2 10*3/uL — ABNORMAL HIGH (ref 4.0–10.5)
nRBC: 0 % (ref 0.0–0.2)

## 2020-05-09 LAB — GLUCOSE, CAPILLARY
Glucose-Capillary: 145 mg/dL — ABNORMAL HIGH (ref 70–99)
Glucose-Capillary: 150 mg/dL — ABNORMAL HIGH (ref 70–99)
Glucose-Capillary: 247 mg/dL — ABNORMAL HIGH (ref 70–99)
Glucose-Capillary: 352 mg/dL — ABNORMAL HIGH (ref 70–99)

## 2020-05-09 LAB — COMPREHENSIVE METABOLIC PANEL
ALT: 20 U/L (ref 0–44)
AST: 14 U/L — ABNORMAL LOW (ref 15–41)
Albumin: 2.7 g/dL — ABNORMAL LOW (ref 3.5–5.0)
Alkaline Phosphatase: 69 U/L (ref 38–126)
Anion gap: 19 — ABNORMAL HIGH (ref 5–15)
BUN: 137 mg/dL — ABNORMAL HIGH (ref 8–23)
CO2: 17 mmol/L — ABNORMAL LOW (ref 22–32)
Calcium: 8.2 mg/dL — ABNORMAL LOW (ref 8.9–10.3)
Chloride: 95 mmol/L — ABNORMAL LOW (ref 98–111)
Creatinine, Ser: 7.39 mg/dL — ABNORMAL HIGH (ref 0.61–1.24)
GFR, Estimated: 8 mL/min — ABNORMAL LOW (ref >60)
Glucose, Bld: 206 mg/dL — ABNORMAL HIGH (ref 70–99)
Potassium: 4.5 mmol/L (ref 3.5–5.1)
Sodium: 131 mmol/L — ABNORMAL LOW (ref 135–145)
Total Bilirubin: 0.5 mg/dL (ref 0.3–1.2)
Total Protein: 6.5 g/dL (ref 6.5–8.1)

## 2020-05-09 LAB — C-REACTIVE PROTEIN: CRP: 6.8 mg/dL — ABNORMAL HIGH (ref <1.0)

## 2020-05-09 LAB — D-DIMER, QUANTITATIVE: D-Dimer, Quant: 0.76 ug/mL-FEU — ABNORMAL HIGH (ref 0.00–0.50)

## 2020-05-09 LAB — FERRITIN: Ferritin: 2425 ng/mL — ABNORMAL HIGH (ref 24–336)

## 2020-05-09 LAB — HEPATITIS B SURFACE ANTIGEN: Hepatitis B Surface Ag: NONREACTIVE

## 2020-05-09 MED ORDER — INSULIN ASPART 100 UNIT/ML ~~LOC~~ SOLN
3.0000 [IU] | Freq: Three times a day (TID) | SUBCUTANEOUS | Status: DC
  Administered 2020-05-10: 3 [IU] via SUBCUTANEOUS

## 2020-05-09 NOTE — Progress Notes (Signed)
Patient ID: [name] [name], male   DOB: 1960/12/17, 60 y.o.   MRN: [id number] S: Feeling better O:BP (!) 154/80 (BP Location: Right Arm)   Pulse 89   Temp (!) 97.5 F (36.4 C) (Oral)   Resp 20   Ht 5\' 10"  (1.778 m)   Wt 88 kg   SpO2 95%   BMI 27.84 kg/m   Intake/Output Summary (Last 24 hours) at 05/09/2020 1205 Last data filed at 05/09/2020 0762 Gross per 24 hour  Intake 640 ml  Output -  Net 640 ml   Intake/Output: I/O last 3 completed shifts: In: 700 [P.O.:500; IV Piggyback:200] Out: 400 [Urine:400]  Intake/Output this shift:  Total I/O In: 240 [P.O.:240] Out: -  Weight change:  Gen: NAD CVS: RRR Resp: cta Abd: benign Ext: no edema, LAVF +T/B  Recent Labs  Lab 05/06/20 0924 05/07/20 0055 05/08/20 0110 05/09/20 0127  NA 132* 133* 130* 131*  K 3.9 4.6 4.7 4.5  CL 92* 96* 96* 95*  CO2 22 22 17* 17*  GLUCOSE 155* 315* 242* 206*  BUN 51* 70* 107* 137*  CREATININE 6.13* 6.73* 7.15* 7.39*  ALBUMIN 2.9* 2.7* 2.5* 2.7*  CALCIUM 8.5* 8.2* 8.0* 8.2*  PHOS  --  6.5*  --   --   AST 22 23 14* 14*  ALT 25 27 23 20    Liver Function Tests: Recent Labs  Lab 05/07/20 0055 05/08/20 0110 05/09/20 0127  AST 23 14* 14*  ALT 27 23 20   ALKPHOS 71 59 69  BILITOT 0.6 0.3 0.5  PROT 6.7 6.1* 6.5  ALBUMIN 2.7* 2.5* 2.7*   No results for input(s): LIPASE, AMYLASE in the last 168 hours. No results for input(s): AMMONIA in the last 168 hours. CBC: Recent Labs  Lab 05/06/20 0924 05/07/20 0055 05/08/20 0110 05/09/20 0127  WBC 9.8 8.4 14.7* 18.2*  NEUTROABS 8.4* 7.4 12.9* 14.7*  HGB 9.6* 9.4* 8.5* 9.5*  HCT 28.9* 28.1* 23.5* 26.4*  MCV 88.7 88.4 84.8 84.9  PLT 253 251 240 307   Cardiac Enzymes: No results for input(s): CKTOTAL, CKMB, CKMBINDEX, TROPONINI in the last 168 hours. CBG: Recent Labs  Lab 05/08/20 0728 05/08/20 1200 05/08/20 1658 05/08/20 2102 05/09/20 0731  GLUCAP 133* 258* 318* 337* 145*    Iron Studies:  Recent Labs    05/09/20 0127   FERRITIN 2,425*   Studies/Results: No results found. . cinacalcet  30 mg Oral Q lunch  . dexamethasone  6 mg Oral Q24H  . heparin  5,000 Units Subcutaneous Q8H  . insulin aspart  0-5 Units Subcutaneous QHS  . insulin aspart  0-6 Units Subcutaneous TID WC  . insulin aspart  3 Units Subcutaneous TID WC  . insulin glargine  5 Units Subcutaneous QHS  . pantoprazole  80 mg Oral Daily  . rosuvastatin  5 mg Oral Daily  . sevelamer carbonate  800 mg Oral TID with meals  . sodium bicarbonate  650 mg Oral BID  . venlafaxine XR  150 mg Oral Q breakfast    BMET    Component Value Date/Time   NA 131 (L) 05/09/2020 0127   K 4.5 05/09/2020 0127   CL 95 (L) 05/09/2020 0127   CO2 17 (L) 05/09/2020 0127   GLUCOSE 206 (H) 05/09/2020 0127   BUN 137 (H) 05/09/2020 0127   CREATININE 7.39 (H) 05/09/2020 0127   CREATININE 2.53 (H) 08/29/2013 1947   CALCIUM 8.2 (L) 05/09/2020 0127   CALCIUM 8.9 09/29/2015 1143   GFRNONAA 8 (  L) 05/09/2020 0127   GFRAA 15 (L) 05/28/2019 0843   CBC    Component Value Date/Time   WBC 18.2 (H) 05/09/2020 0127   RBC 3.11 (L) 05/09/2020 0127   HGB 9.5 (L) 05/09/2020 0127   HCT 26.4 (L) 05/09/2020 0127   PLT 307 05/09/2020 0127   MCV 84.9 05/09/2020 0127   MCV 83.3 08/29/2013 1947   MCH 30.5 05/09/2020 0127   MCHC 36.0 05/09/2020 0127   RDW 12.8 05/09/2020 0127   LYMPHSABS 0.8 05/09/2020 0127   MONOABS 1.0 05/09/2020 0127   EOSABS 0.0 05/09/2020 0127   BASOSABS 0.1 05/09/2020 0127    Dialysis Orders: [facility] schedule. EDW85kgHD Bath 2K/3.5CaTime 4 hoursHeparin none. AccessLUE AVFBFR 400DFR 800  Micera 100 mcg IVP every 2 weeks  Assessment/Plan: 1. Pneumonia due to covid-19 virus- started on IV decadron and remdesivir per primary svc due to elevated inflammatory markers and worsening SOB. Feels better today.  2. ESRD- Only requires dialysis twice a week, however may require more frequent dialysis since he is now  on decadron. Will follow for now and plan for HD on Friday. 3. Hypertension/volume- appears volume depleted. Encourage po and follow 4. Anemia- Stable, will dose aranesp with HD on Friday 5. Metabolic bone disease- Continue with outpatient meds 6. Nutrition- Renal diet 7. Disposition- hopeful discharge to home after HD today.   Donetta Potts, MD [employer] [phone]

## 2020-05-09 NOTE — Evaluation (Signed)
Occupational Therapy Evaluation Patient Details Name: [name] [name] MRN: [id number] DOB: 09/18/1960 Today's Date: 05/09/2020    History of Present Illness 60yo male with PMH of bladder cancer and renal cancer s/p resection, ESRD on HD MWF, severe GERD, and depression presenting with weakness, SOB, fever and chills.  since being diagnosed with covid five days earlier. In ED 05/06/20 pt requiring 2L O2 via [place] to maintain O2 saturation. Admitted for treatment of COVID-19 PNA.   Clinical Impression   Pt admitted with above. He demonstrates the below listed deficits and will benefit from continued OT to maximize safety and independence with BADLs. Pt presents to OT with generalized weakness, decreased activity tolerance. He currently requires supervision with ADLs, but fatigues.  Sp02 88-93% on 4L with dips into the 70s, but with poor pleth, so unsure of accuracy.  Pt reports he lives at home with his wife and children and was fully independent PTA including working and driving.  Will follow.       Follow Up Recommendations  No OT follow up;Supervision - Intermittent    Equipment Recommendations  None recommended by OT    Recommendations for Other Services       Precautions / Restrictions Restrictions Weight Bearing Restrictions: No      Mobility Bed Mobility Overal bed mobility: Independent                  Transfers Overall transfer level: Modified independent Equipment used: None                  Balance Overall balance assessment: Mild deficits observed, not formally tested                                         ADL either performed or assessed with clinical judgement   ADL Overall ADL's : Needs assistance/impaired Eating/Feeding: Independent   Grooming: Wash/dry hands;Wash/dry face;Oral care;Brushing hair;Supervision/safety;Standing   Upper Body Bathing: Set up;Sitting   Lower Body Bathing: Supervison/ safety;Sit to/from stand    Upper Body Dressing : Set up;Sitting   Lower Body Dressing: Supervision/safety;Sit to/from stand   Toilet Transfer: Supervision/safety;Ambulation;Comfort height toilet;Grab bars   Toileting- Clothing Manipulation and Hygiene: Supervision/safety;Sit to/from stand       Functional mobility during ADLs: Supervision/safety       Vision         Perception     Praxis      Pertinent Vitals/Pain Pain Assessment: No/denies pain     Hand Dominance Right   Extremity/Trunk Assessment Upper Extremity Assessment Upper Extremity Assessment: Generalized weakness   Lower Extremity Assessment Lower Extremity Assessment: Generalized weakness   Cervical / Trunk Assessment Cervical / Trunk Assessment: Normal   Communication Communication Communication: No difficulties   Cognition Arousal/Alertness: Awake/alert Behavior During Therapy: Flat affect Overall Cognitive Status: Within Functional Limits for tasks assessed                                     General Comments  sp02 at rest on RA 93%, but dropped to 81% with activity.   Increased 02 to 2L with sp02 sustaining in the low 80s.  Sp02 on 4L 88-92 with periods in the high 70s, but with poor pleth so unsure of accuracy.    Exercises     Shoulder Instructions  Home Living Family/patient expects to be discharged to:: Private residence Living Arrangements: Spouse/significant other Available Help at Discharge: Family;Available 24 hours/day Type of Home: House Home Access: Stairs to enter [employer] of Steps: 3 Entrance Stairs-Rails: None Home Layout: Two level;Able to live on main level with bedroom/bathroom     Bathroom Shower/Tub: Teacher, early years/pre: Handicapped height Bathroom Accessibility: Yes   Home Equipment: None          Prior Functioning/Environment Level of Independence: Independent        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 OT Goal Formulation: With patient Time For Goal Achievement: 05/23/20 Potential to Achieve Goals: Good ADL Goals Additional ADL Goal #1: Pt will be able to actively participate in 25 mins therapeutic activity with no more than 2 rest breaks and VSS. Additional ADL Goal #2: Pt will independently incorporate energy conservation strategies during ADLs  OT Frequency: Min 2X/week   Barriers to D/C:            Co-evaluation              AM-PAC OT "6 Clicks" Daily Activity     Outcome Measure Help from another person eating meals?: None Help from another person taking care of personal grooming?: A Little Help from another person toileting, which includes using toliet, bedpan, or urinal?: A Little Help from another person bathing (including washing, rinsing, drying)?: A Little Help from another person to put on and taking off regular upper body clothing?: A Little Help from another person to put on and taking off regular lower body clothing?: A Little 6 Click Score: 19   End of Session Equipment Utilized During Treatment: Oxygen Nurse Communication: Mobility status  Activity Tolerance: Patient tolerated treatment well Patient left: in bed;with call bell/phone within reach  OT Visit Diagnosis: Unsteadiness on feet (R26.81)                Time: 5809-9833 OT Time Calculation (min): 34 min Charges:  OT General Charges $OT Visit: 1 Visit OT Evaluation $OT Eval Moderate Complexity: 1 Mod OT Treatments $Self Care/Home  Management : 8-22 mins  Nilsa Nutting., OTR/L Acute Rehabilitation Services Pager [phone] Office [phone]   Lucille Passy M 05/09/2020, 12:02 PM

## 2020-05-09 NOTE — Care Management Important Message (Signed)
Important Message  Patient Details  Name: Smayan Hackbart MRN: [id number] Date of Birth: [birth year]   Medicare Important Message Given:  Yes - Important Message mailed due to current National Emergency  Verbal consent obtained due to current National Emergency  Relationship to patient: Self Contact Name: Aum Call Date: 05/09/20  Time: 1458 Phone: [phone] Outcome: No Answer/Busy Important Message mailed to: Patient address on file    Delorse Lek 05/09/2020, 2:58 PM

## 2020-05-09 NOTE — Progress Notes (Signed)
Subjective:   Hospital day: 3  Overnight event: No acute event  Patient notes feeling well this morning. He notes improvement in his shortness of breath while walking. He is able to ambulate to the bathroom without getting short of breath but continues to wear oxygen. No other concerns at this time.   Objective:  Vital signs in last 24 hours: Vitals:   05/08/20 1941 05/09/20 0010 05/09/20 0328 05/09/20 0335  BP: (!) 156/74 (!) 150/75 (!) 159/81   Pulse: 81 90 90   Resp: 19 20 20    Temp: (!) 97.5 F (36.4 C) (!) 97.5 F (36.4 C) 97.8 F (36.6 C)   TempSrc: Oral Oral Oral   SpO2: 91% 93% (!) 86% 90%  Weight:      Height:       CBC Latest Ref Rng & Units 05/09/2020 05/08/2020 05/07/2020  WBC 4.0 - 10.5 K/uL 18.2(H) 14.7(H) 8.4  Hemoglobin 13.0 - 17.0 g/dL 9.5(L) 8.5(L) 9.4(L)  Hematocrit 39.0 - 52.0 % 26.4(L) 23.5(L) 28.1(L)  Platelets 150 - 400 K/uL 307 240 251   CMP Latest Ref Rng & Units 05/09/2020 05/08/2020 05/07/2020  Glucose 70 - 99 mg/dL 206(H) 242(H) 315(H)  BUN 8 - 23 mg/dL 137(H) 107(H) 70(H)  Creatinine 0.61 - 1.24 mg/dL 7.39(H) 7.15(H) 6.73(H)  Sodium 135 - 145 mmol/L 131(L) 130(L) 133(L)  Potassium 3.5 - 5.1 mmol/L 4.5 4.7 4.6  Chloride 98 - 111 mmol/L 95(L) 96(L) 96(L)  CO2 22 - 32 mmol/L 17(L) 17(L) 22  Calcium 8.9 - 10.3 mg/dL 8.2(L) 8.0(L) 8.2(L)  Total Protein 6.5 - 8.1 g/dL 6.5 6.1(L) 6.7  Total Bilirubin 0.3 - 1.2 mg/dL 0.5 0.3 0.6  Alkaline Phos 38 - 126 U/L 69 59 71  AST 15 - 41 U/L 14(L) 14(L) 23  ALT 0 - 44 U/L 20 23 27     Physical Exam  Physical Exam Constitutional:      General: He is not in acute distress. HENT:     Head: Normocephalic.  Cardiovascular:     Rate and Rhythm: Normal rate and regular rhythm.  Pulmonary:     Effort: Pulmonary effort is normal. No respiratory distress.     Breath sounds: Normal breath sounds.  Musculoskeletal:     Cervical back: Normal range of motion.     Right lower leg: No edema.     Left lower leg: No  edema.  Skin:    General: Skin is warm.  Neurological:     Mental Status: He is alert.  Psychiatric:        Mood and Affect: Mood normal.      Assessment/Plan: [name] [name] is a 60yo male with PMH of bladder cancer and renal cancer s/p resection, ESRD on HD MF, severe GERD, and depression presenting with weakness secondary to Covid 19 infection   Active Problems: Pneumonia due to COVID-19 virus  Covid-19 Pneumonia-diagnosed 1/27 Patient O2 sat in the 90s on 2L. Reports improvement of his shortness of breath. His inflammatory markers continue trending down. Will continue remdesivir and Decadron.  Plan was to assess his ambulating O2 sat after HD. However patient was to tired to walk after HD per RN. Will assess his oxygen requirement tomorrow with PT. He is likely to be discharged tomorrow.   - remdesivir 3/5 days - decadron 3/10 days  - prn robitussen  - trend inflammatory markers  - supplemental O2, keep sats >90%   ESRD on HD Metabolic acidosis Nephrology is on board.HD today -CMP  in AM - cont. renvela - cont. Sodium bicarb 650 bid   Type 2 diabetes A1c of 6.4.  Blood sugar remains elevated while on Decadron. Add Novolog 3 units TID with meals  -Lantus 5 unit at bedtime -Novolog 3 unit TID with meals -Sliding scale insulin -CBG   Normocytic anemia Likely due to anemia of chronic disease. Patient receivedAranesp with HD per nephrology  Diet:Regular diet IVF:N/A HVF:[id number] CODE:DNR  Prior to Admission Living Arrangement:Home Anticipated Discharge Location:Home Barriers to Discharge:Oxygen requirement for COVID-19 infection  [name], DO 05/09/2020, 6:50 AM Pager: [phone] After 5pm on weekdays and 1pm on weekends: On Call pager [phone]

## 2020-05-09 NOTE — Progress Notes (Signed)
PT Cancellation Note  Patient Details Name: [name] [name] MRN: [id number] DOB: [birth year]   Cancelled Treatment:    Reason Eval/Treat Not Completed: (P) Patient at procedure or test/unavailable Pt off floor for HD. PT will follow back tomorrow for stair training before going home.   Ranell Finelli B. Migdalia Dk PT, DPT Acute Rehabilitation Services Pager [phone] Office [phone]    [place] 05/09/2020, 1:44 PM

## 2020-05-09 NOTE — Discharge Summary (Addendum)
Name: [name] [name] MRN: [id number] DOB: [birth year] 60 y.o. PCP: Nickola Major, MD  Date of Admission: 05/06/2020  8:57 AM Date of Discharge: 05/11/2019 Attending Physician: Axel Filler, *  Discharge Diagnosis: 1.  COVID-19 infection 2.  ESRD on hemodialysis 3.  Metabolic acidosis 4.  Diabetes mellitus  5.  Normocytic anemia  Discharge Medications: Allergies as of 05/10/2020      Reactions   Lisinopril Cough      Medication List    TAKE these medications   amLODipine 10 MG tablet Commonly known as: NORVASC Take 10 mg by mouth daily.   Auryxia 1 GM 210 MG(Fe) tablet Generic drug: ferric citrate Take 420 mg by mouth daily.   cinacalcet 30 MG tablet Commonly known as: SENSIPAR Take 30 mg by mouth every Monday, Wednesday, and Friday.   pantoprazole 40 MG tablet Commonly known as: PROTONIX Take 40 mg by mouth 2 (two) times daily.   rosuvastatin 5 MG tablet Commonly known as: CRESTOR Take 5 mg by mouth daily.   sevelamer carbonate 800 MG tablet Commonly known as: RENVELA Take 800 mg by mouth 3 (three) times daily.   sodium bicarbonate 650 MG tablet Take 650 mg by mouth 2 (two) times daily.   venlafaxine XR 150 MG 24 hr capsule Commonly known as: EFFEXOR-XR Take 1 capsule (150 mg total) by mouth daily with breakfast.            Durable Medical Equipment  (From admission, onward)         Start     Ordered   05/10/20 1359  DME Oxygen  Once       Question Answer Comment  Length of Need 6 Months   Mode or (Route) Nasal cannula   Liters per Minute 4   Frequency Continuous (stationary and portable oxygen unit needed)   Oxygen conserving device Yes   Oxygen delivery system Gas      05/10/20 1404          Disposition and follow-up:   [name] was discharged from [facility] in Stable condition.  At the hospital follow up visit please address:  1.   COVID-19 pneumonia -Follow-up with PCP to assess  for symptoms and oxygen requirement  ESRD Follow-up with nephrology Follow hemodialysis schedule  Diabetes mellitus A1C 6.4. Patient is not on any medications at home - Recommend for initiation of antidiabetic at PCP follow up visit   2.  Labs / imaging needed at time of follow-up: CBC, Renal function panel  3.  Pending labs/ test needing follow-up: NA  Follow-up Appointments:  Follow-up Information    [facility]. Schedule an appointment as soon as possible for a visit in 1 week(s).   Contact information: [address] [place] [address] [phone]       Nickola Major, MD Follow up in 2 week(s).   Specialty: Family Medicine Contact information: [address] [place] [address] [phone]              HPI on admission:  [name] [name] is a 60yo male with PMH of bladder cancer and renal cancer s/p resection, ESRD on HD MWF, severe GERD, and depression presenting with weakness since being diagnosed with covid five days ago. He is accompanied by his wife who had covid before him. He went to HD Friday and Monday and both days took a lot out of him and made him feel  even weaker. He thinks he was given back fluid yesterday but is not sure. He endorses shortness of breath, fever, chills, and decreased appetite. He had a cookie yesterday evening but hasn't eaten anything since then. He denies chest pain, cough, sore throat, change in taste, abdominal pain, nausea, headache, changes in vision, sinus pain.   He had both vaccinations but has not yet had his booster shot.   Hospital Course by problem list: 1.  COVID-19 pneumonia  Patient presents with shortness of breath and fatigue due to COVID-19 pneumonia.  His other symptoms included fever, chill, and decreased appetite.  Patient received both vaccinations but has not yet had his booster shot. He was diagnosed with COVID-19 5 days prior.  On admission he requires 2-4 L  of oxygen.  He was started on remdesivir and Decadron.  His inflammatory markers were markedly elevated but are trending down.  Patient weaned to room air at rest but requiring 4L of O2 with ambulation to maintain oxygen saturation >88%. He was discharged home with oxygen and recommended to follow up in post-COVID clinic and with PCP.   2.  ESRD Patient has history of ESRD on dialysis Monday and Friday.  Patient received dialysis on Friday here.  He will resume his normal dialysis schedule after discharge and follow-up with nephrology and transplant group.  3. Diabetes mellitus Patient's A1c is 6.4. He does not take any medications for diabetes at home. He will need to follow up with his PCP to start medications for glucose control.  Discharge Exam:   BP 136/77 (BP Location: Right Arm)   Pulse 79   Temp 97.9 F (36.6 C) (Axillary)   Resp 18   Ht 5\' 10"  (1.778 m)   Wt 85 kg   SpO2 94%   BMI 26.89 kg/m  Discharge exam:  Physical Exam  Constitutional: Appears well-developed and well-nourished. No distress.  HENT: Normocephalic and atraumatic, EOMI, conjunctiva normal, moist mucous membranes Cardiovascular: Normal rate, regular rhythm, S1 and S2 present, no murmurs, rubs, gallops.  Distal pulses intact Respiratory: No respiratory distress, no accessory muscle use. Lungs are clear to auscultation bilaterally. On room air.  GI: Nondistended, soft, nontender to palpation, active bowel sounds Musculoskeletal: Normal bulk and tone.  No peripheral edema noted. Neurological: Is alert and oriented x4, no apparent focal deficits noted. Skin: Warm and dry.  No rash, erythema, lesions noted. Psychiatric: Normal mood and affect. Behavior is normal. Judgment and thought content normal.   Pertinent Labs, Studies, and Procedures:  CBC Latest Ref Rng & Units 05/10/2020 05/09/2020 05/08/2020  WBC 4.0 - 10.5 K/uL 13.3(H) 18.2(H) 14.7(H)  Hemoglobin 13.0 - 17.0 g/dL 9.3(L) 9.5(L) 8.5(L)  Hematocrit 39.0 - 52.0  % 27.0(L) 26.4(L) 23.5(L)  Platelets 150 - 400 K/uL 282 307 240   CMP Latest Ref Rng & Units 05/10/2020 05/09/2020 05/08/2020  Glucose 70 - 99 mg/dL 143(H) 206(H) 242(H)  BUN 8 - 23 mg/dL 87(H) 137(H) 107(H)  Creatinine 0.61 - 1.24 mg/dL 5.28(H) 7.39(H) 7.15(H)  Sodium 135 - 145 mmol/L 136 131(L) 130(L)  Potassium 3.5 - 5.1 mmol/L 4.6 4.5 4.7  Chloride 98 - 111 mmol/L 98 95(L) 96(L)  CO2 22 - 32 mmol/L 23 17(L) 17(L)  Calcium 8.9 - 10.3 mg/dL 8.5(L) 8.2(L) 8.0(L)  Total Protein 6.5 - 8.1 g/dL 6.2(L) 6.5 6.1(L)  Total Bilirubin 0.3 - 1.2 mg/dL 0.5 0.5 0.3  Alkaline Phos 38 - 126 U/L 66 69 59  AST 15 - 41 U/L 18 14(L) 14(L)  ALT 0 - 44 U/L 24 20 23    COVID-19 Labs  Recent Labs    05/08/20 0110 05/09/20 0127 05/10/20 0426  DDIMER 0.90* 0.76* 0.66*  FERRITIN 3,129* 2,425* 2,691*  CRP 10.3* 6.8* 4.9*    DG Chest Port 1 View Result Date: 05/06/2020  FINDINGS: Patchy bilateral pneumonia. Lung volumes are low with accentuated heart size. No effusion or pneumothorax.  IMPRESSION: Atypical pneumonia pattern.   Discharge Instructions: Discharge Instructions    Call MD for:  difficulty breathing, headache or visual disturbances   Complete by: As directed    Call MD for:  extreme fatigue   Complete by: As directed    Call MD for:  persistant dizziness or light-headedness   Complete by: As directed    Call MD for:  persistant nausea and vomiting   Complete by: As directed    Call MD for:  temperature >100.4   Complete by: As directed    Diet - low sodium heart healthy   Complete by: As directed    Discharge instructions   Complete by: As directed    Mr [name] [name],  You were admitted to the hospital with shortness of breath in setting of COVID-19 Pneumonia. You were treated with improvement in symptoms. Please continue to use oxygen when walking. Please follow up at the post-COVID clinic and with your PCP.  Please continue to go to your scheduled dialysis sessions.  Thank you!    Increase activity slowly   Complete by: As directed       Signed: Harvie Heck, MD  [facility] PGY-2 05/10/2020, 2:04 PM   Pager: [phone]

## 2020-05-10 LAB — C-REACTIVE PROTEIN: CRP: 4.9 mg/dL — ABNORMAL HIGH (ref <1.0)

## 2020-05-10 LAB — COMPREHENSIVE METABOLIC PANEL
ALT: 24 U/L (ref 0–44)
AST: 18 U/L (ref 15–41)
Albumin: 2.6 g/dL — ABNORMAL LOW (ref 3.5–5.0)
Alkaline Phosphatase: 66 U/L (ref 38–126)
Anion gap: 15 (ref 5–15)
BUN: 87 mg/dL — ABNORMAL HIGH (ref 8–23)
CO2: 23 mmol/L (ref 22–32)
Calcium: 8.5 mg/dL — ABNORMAL LOW (ref 8.9–10.3)
Chloride: 98 mmol/L (ref 98–111)
Creatinine, Ser: 5.28 mg/dL — ABNORMAL HIGH (ref 0.61–1.24)
GFR, Estimated: 12 mL/min — ABNORMAL LOW (ref >60)
Glucose, Bld: 143 mg/dL — ABNORMAL HIGH (ref 70–99)
Potassium: 4.6 mmol/L (ref 3.5–5.1)
Sodium: 136 mmol/L (ref 135–145)
Total Bilirubin: 0.5 mg/dL (ref 0.3–1.2)
Total Protein: 6.2 g/dL — ABNORMAL LOW (ref 6.5–8.1)

## 2020-05-10 LAB — CBC WITH DIFFERENTIAL/PLATELET
Abs Immature Granulocytes: 1.32 10*3/uL — ABNORMAL HIGH (ref 0.00–0.07)
Basophils Absolute: 0 10*3/uL (ref 0.0–0.1)
Basophils Relative: 0 %
Eosinophils Absolute: 0 10*3/uL (ref 0.0–0.5)
Eosinophils Relative: 0 %
HCT: 27 % — ABNORMAL LOW (ref 39.0–52.0)
Hemoglobin: 9.3 g/dL — ABNORMAL LOW (ref 13.0–17.0)
Immature Granulocytes: 10 %
Lymphocytes Relative: 6 %
Lymphs Abs: 0.8 10*3/uL (ref 0.7–4.0)
MCH: 29.7 pg (ref 26.0–34.0)
MCHC: 34.4 g/dL (ref 30.0–36.0)
MCV: 86.3 fL (ref 80.0–100.0)
Monocytes Absolute: 0.7 10*3/uL (ref 0.1–1.0)
Monocytes Relative: 5 %
Neutro Abs: 10.8 10*3/uL — ABNORMAL HIGH (ref 1.7–7.7)
Neutrophils Relative %: 79 %
Platelets: 282 10*3/uL (ref 150–400)
RBC: 3.13 MIL/uL — ABNORMAL LOW (ref 4.22–5.81)
RDW: 12.8 % (ref 11.5–15.5)
WBC: 13.3 10*3/uL — ABNORMAL HIGH (ref 4.0–10.5)
nRBC: 0.2 % (ref 0.0–0.2)

## 2020-05-10 LAB — FERRITIN: Ferritin: 2691 ng/mL — ABNORMAL HIGH (ref 24–336)

## 2020-05-10 LAB — GLUCOSE, CAPILLARY
Glucose-Capillary: 111 mg/dL — ABNORMAL HIGH (ref 70–99)
Glucose-Capillary: 167 mg/dL — ABNORMAL HIGH (ref 70–99)

## 2020-05-10 LAB — D-DIMER, QUANTITATIVE: D-Dimer, Quant: 0.66 ug/mL-FEU — ABNORMAL HIGH (ref 0.00–0.50)

## 2020-05-10 NOTE — Progress Notes (Signed)
Physical Therapy Treatment Patient Details Name: [name] [name] MRN: [id number] DOB: March 27, 1961 Today's Date: 05/10/2020    History of Present Illness 60yo male with PMH of bladder cancer and renal cancer s/p resection, ESRD on HD MWF, severe GERD, and depression presenting with weakness, SOB, fever and chills.  since being diagnosed with covid five days earlier. In ED 05/06/20 pt requiring 2L O2 via [place] to maintain O2 saturation. Admitted for treatment of COVID-19 PNA.    PT Comments    Patient moving well without need for physical assistance. However he continues to desaturate (see separate ambulatory saturation note) and requires 4L of oxygen to maintain sats at 88%. Pt able to return demonstrate the slow pace that he must maintain during ambulation and on the stairs to maintain his oxygen levels.     Follow Up Recommendations  No PT follow up;Supervision - Intermittent     Equipment Recommendations  None recommended by PT    Recommendations for Other Services       Precautions / Restrictions Precautions Precautions: None    Mobility  Bed Mobility Overal bed mobility: Independent             General bed mobility comments: from supine to sit, including manipulating covers  Transfers Overall transfer level: Modified independent Equipment used: None Transfers: Sit to/from Stand Sit to Stand: Modified independent (Device/Increase time)            Ambulation/Gait Ambulation/Gait assistance: Supervision Gait Distance (Feet): 180 Feet Assistive device: None Gait Pattern/deviations: Step-through pattern;WFL(Within Functional Limits) Gait velocity: variable   General Gait Details: supervision for safety, gait speed decreased with distance and fatigue   Stairs   Stairs assistance: Supervision Stair Management: No rails;One rail Right;Step to pattern Number of Stairs: 4 General stair comments: Pt taking stairs slowly (appropriately for  oxygenation)   Wheelchair Mobility    Modified Rankin (Stroke Patients Only)       Balance Overall balance assessment: Mild deficits observed, not formally tested                                          Cognition Arousal/Alertness: Awake/alert Behavior During Therapy: Flat affect Overall Cognitive Status: Within Functional Limits for tasks assessed                                        Exercises Other Exercises Other Exercises: vc for use of IS with pt thinking he should do it 2-3 times per day. Clarified 10 breaths/hour and pt able to verbalize at end of session (cues for slowed inhale, pt with lightheadedness after 5 inhalations, suggested 10 per hour but spaced out)    General Comments General comments (skin integrity, edema, etc.): Pt reported he thinks he will go home today.      Pertinent Vitals/Pain Pain Assessment: No/denies pain    Home Living                      Prior Function            PT Goals (current goals can now be found in the care plan section) Acute Rehab PT Goals Patient Stated Goal: to go home! PT Goal Formulation: With patient Time For Goal Achievement: 05/22/20 Potential to Achieve Goals: Good  Frequency    Min 3X/week      PT Plan Current plan remains appropriate    Co-evaluation              AM-PAC PT "6 Clicks" Mobility   Outcome Measure  Help needed turning from your back to your side while in a flat bed without using bedrails?: None Help needed moving from lying on your back to sitting on the side of a flat bed without using bedrails?: None Help needed moving to and from a bed to a chair (including a wheelchair)?: None Help needed standing up from a chair using your arms (e.g., wheelchair or bedside chair)?: None Help needed to walk in hospital room?: None Help needed climbing 3-5 steps with a railing? : None 6 Click Score: 24    End of Session Equipment Utilized  During Treatment: Oxygen Activity Tolerance: Patient tolerated treatment well Patient left: in chair;with call bell/phone within reach Nurse Communication: Mobility status;Other (comment) (O2 needed) PT Visit Diagnosis: Muscle weakness (generalized) (M62.81);Difficulty in walking, not elsewhere classified (R26.2)     Time: 3646-8032 PT Time Calculation (min) (ACUTE ONLY): 36 min  Charges:  $Gait Training: 23-37 mins                      Arby Barrette, PT Pager [phone]    Rexanne Mano 05/10/2020, 3:44 PM

## 2020-05-10 NOTE — Progress Notes (Signed)
Patient ID: [name] [name], male   DOB: [birth year], 60 y.o.   MRN: [id number] S: No complaints but does desat when ambulating. O:BP 136/77 (BP Location: Right Arm)   Pulse 79   Temp 97.9 F (36.6 C) (Axillary)   Resp 18   Ht 5\' 10"  (1.778 m)   Wt 85 kg   SpO2 94%   BMI 26.89 kg/m   Intake/Output Summary (Last 24 hours) at 05/10/2020 1134 Last data filed at 05/09/2020 2300 Gross per 24 hour  Intake 580 ml  Output 2000 ml  Net -1420 ml   Intake/Output: I/O last 3 completed shifts: In: 1220 [P.O.:920; IV Piggyback:300] Out: 2000 [Other:2000]  Intake/Output this shift:  No intake/output data recorded. Weight change:  Gen: NAD CVS: RRR Resp: cta Abd: +BS, soft, NT/ND Ext: no edema, LUE AVF +T/B  Recent Labs  Lab 05/06/20 0924 05/07/20 0055 05/08/20 0110 05/09/20 0127 05/10/20 0426  NA 132* 133* 130* 131* 136  K 3.9 4.6 4.7 4.5 4.6  CL 92* 96* 96* 95* 98  CO2 22 22 17* 17* 23  GLUCOSE 155* 315* 242* 206* 143*  BUN 51* 70* 107* 137* 87*  CREATININE 6.13* 6.73* 7.15* 7.39* 5.28*  ALBUMIN 2.9* 2.7* 2.5* 2.7* 2.6*  CALCIUM 8.5* 8.2* 8.0* 8.2* 8.5*  PHOS  --  6.5*  --   --   --   AST 22 23 14* 14* 18  ALT 25 27 23 20 24    Liver Function Tests: Recent Labs  Lab 05/08/20 0110 05/09/20 0127 05/10/20 0426  AST 14* 14* 18  ALT 23 20 24   ALKPHOS 59 69 66  BILITOT 0.3 0.5 0.5  PROT 6.1* 6.5 6.2*  ALBUMIN 2.5* 2.7* 2.6*   No results for input(s): LIPASE, AMYLASE in the last 168 hours. No results for input(s): AMMONIA in the last 168 hours. CBC: Recent Labs  Lab 05/06/20 0924 05/07/20 0055 05/08/20 0110 05/09/20 0127 05/10/20 0426  WBC 9.8 8.4 14.7* 18.2* 13.3*  NEUTROABS 8.4* 7.4 12.9* 14.7* 10.8*  HGB 9.6* 9.4* 8.5* 9.5* 9.3*  HCT 28.9* 28.1* 23.5* 26.4* 27.0*  MCV 88.7 88.4 84.8 84.9 86.3  PLT 253 251 240 307 282   Cardiac Enzymes: No results for input(s): CKTOTAL, CKMB, CKMBINDEX, TROPONINI in the last 168 hours. CBG: Recent Labs  Lab  05/09/20 0731 05/09/20 1225 05/09/20 1747 05/09/20 2135 05/10/20 0754  GLUCAP 145* 150* 247* 352* 111*    Iron Studies:  Recent Labs    05/10/20 0426  FERRITIN 2,691*   Studies/Results: No results found. . cinacalcet  30 mg Oral Q lunch  . dexamethasone  6 mg Oral Q24H  . heparin  5,000 Units Subcutaneous Q8H  . insulin aspart  0-5 Units Subcutaneous QHS  . insulin aspart  0-6 Units Subcutaneous TID WC  . insulin aspart  3 Units Subcutaneous TID WC  . insulin glargine  5 Units Subcutaneous QHS  . pantoprazole  80 mg Oral Daily  . rosuvastatin  5 mg Oral Daily  . sevelamer carbonate  800 mg Oral TID with meals  . sodium bicarbonate  650 mg Oral BID  . venlafaxine XR  150 mg Oral Q breakfast    BMET    Component Value Date/Time   NA 136 05/10/2020 0426   K 4.6 05/10/2020 0426   CL 98 05/10/2020 0426   CO2 23 05/10/2020 0426   GLUCOSE 143 (H) 05/10/2020 0426   BUN 87 (H) 05/10/2020 0426   CREATININE 5.28 (H) 05/10/2020 9417  CREATININE 2.53 (H) 08/29/2013 1947   CALCIUM 8.5 (L) 05/10/2020 0426   CALCIUM 8.9 09/29/2015 1143   GFRNONAA 12 (L) 05/10/2020 0426   GFRAA 15 (L) 05/28/2019 0843   CBC    Component Value Date/Time   WBC 13.3 (H) 05/10/2020 0426   RBC 3.13 (L) 05/10/2020 0426   HGB 9.3 (L) 05/10/2020 0426   HCT 27.0 (L) 05/10/2020 0426   PLT 282 05/10/2020 0426   MCV 86.3 05/10/2020 0426   MCV 83.3 08/29/2013 1947   MCH 29.7 05/10/2020 0426   MCHC 34.4 05/10/2020 0426   RDW 12.8 05/10/2020 0426   LYMPHSABS 0.8 05/10/2020 0426   MONOABS 0.7 05/10/2020 0426   EOSABS 0.0 05/10/2020 0426   BASOSABS 0.0 05/10/2020 0426     Dialysis Orders: Center:[facility] Monday/Friday schedule. EDW85kgHD Bath 2K/3.5CaTime 4 hoursHeparin none. AccessLUE AVFBFR 400DFR 800  Micera 100 mcg IVP every 2 weeks  Assessment/Plan: 1. Pneumonia due to covid-19 virus- started on IV decadron and remdesivir per primary svc due to elevated inflammatory  markers and worsening SOB. Feels better today.  2. ESRD- Only requires dialysis twice a week, however may require more frequent dialysis since he is now on decadron. Will follow for now and plan for HD on Monday if still here. 3. Hypertension/volume- appears volume depleted. Encourage po and follow 4. Anemia- Stable, will dose aranesp with HD on Friday 5. Metabolic bone disease- Continue with outpatient meds 6. Nutrition- Renal diet 7. Disposition- hopeful discharge to home soon pending O2 evaluation   Donetta Potts, MD [facility] [phone]

## 2020-05-10 NOTE — Progress Notes (Signed)
[name] [name] to be D/C'd Home per MD order.  Discussed with the patient and all questions fully answered.  VSS, Skin clean, dry and intact without evidence of skin break down, no evidence of skin tears noted. IV catheter discontinued intact. Site without signs and symptoms of complications. Dressing and pressure applied.  An After Visit Summary was printed and given to the patient.   D/c education completed with patient/family including follow up instructions, medication list, d/c activities limitations if indicated, with other d/c instructions as indicated by MD - patient able to verbalize understanding, all questions fully answered.   Patient instructed to return to ED, call 911, or call MD for any changes in condition.   Patient escorted via [place], and D/C home via private auto.  [name] 05/10/2020 4:39 PM

## 2020-05-10 NOTE — Progress Notes (Signed)
SATURATION QUALIFICATIONS: (This note is used to comply with regulatory documentation for home oxygen)  Patient Saturations on Room Air at Rest = 91%  Patient Saturations on Room Air while Ambulating = 85%  Patient Saturations on 4 Liters of oxygen while Ambulating = 88%  Please briefly explain why patient needs home oxygen: To maintain sats 88% or above during functional activity.    Arby Barrette, PT Pager [phone]

## 2020-05-10 NOTE — TOC Transition Note (Signed)
Transition of Care [facility]) - CM/SW Discharge Note   Patient Details  Name: Chrishawn Boley MRN: [id number] Date of Birth: October 11, 1960  Transition of Care [facility]) CM/SW Contact:  Carles Collet, RN Phone Number: 05/10/2020, 3:06 PM   Clinical Narrative:    Damaris Schooner w patient, discussed home oxygen needs. O2 ordered through [place]. It will be delivered to the unit to go home with him. His wife will provide transportation home. No other CM needs identified.     Final next level of care: Home/Self Care Barriers to Discharge: Barriers Resolved   Patient Goals and CMS Choice Patient states their goals for this hospitalization and ongoing recovery are:: to go home      Discharge Placement                       Discharge Plan and Services                DME Arranged: Oxygen DME Agency:  [facility]) Date DME Agency Contacted: 05/10/20 Time DME Agency Contacted: [phone] Representative spoke with at DME Agency: [place] ([place]) Interventions     Readmission Risk Interventions No flowsheet data found.

## 2020-05-11 ENCOUNTER — Telehealth: Payer: Self-pay | Admitting: Nephrology

## 2020-05-11 LAB — CULTURE, BLOOD (ROUTINE X 2)
Culture: NO GROWTH
Culture: NO GROWTH
Special Requests: ADEQUATE
Special Requests: ADEQUATE

## 2020-05-11 NOTE — Telephone Encounter (Signed)
Transition of Care Contact from [place]  Date of Discharge: 05/10/20 Date of Contact: 05/11/20 Method of contact: phone - attempted  Attempted to contact patient to discuss transition of care from inpatient admission.  Patient did not answer the phone.  Message was left on patient's voicemail informing them we would attempt to call them again and if unable to reach will follow up at dialysis.  Jen Mow, PA-C [facility] Pager: [phone]

## 2020-08-25 ENCOUNTER — Emergency Department (HOSPITAL_COMMUNITY): Payer: Medicare Other

## 2020-08-25 ENCOUNTER — Other Ambulatory Visit: Payer: Self-pay

## 2020-08-25 ENCOUNTER — Encounter (HOSPITAL_COMMUNITY): Payer: Self-pay

## 2020-08-25 ENCOUNTER — Observation Stay (HOSPITAL_COMMUNITY)
Admission: EM | Admit: 2020-08-25 | Discharge: 2020-08-26 | Disposition: A | Discharge status: Home / Self Care | Payer: Medicare Other | Attending: Internal Medicine | Admitting: Internal Medicine

## 2020-08-25 DIAGNOSIS — I10 Essential (primary) hypertension: Secondary | ICD-10-CM | POA: Diagnosis present

## 2020-08-25 DIAGNOSIS — E875 Hyperkalemia: Secondary | ICD-10-CM | POA: Diagnosis not present

## 2020-08-25 DIAGNOSIS — F332 Major depressive disorder, recurrent severe without psychotic features: Secondary | ICD-10-CM

## 2020-08-25 DIAGNOSIS — Z79899 Other long term (current) drug therapy: Secondary | ICD-10-CM | POA: Diagnosis not present

## 2020-08-25 DIAGNOSIS — N186 End stage renal disease: Principal | ICD-10-CM | POA: Insufficient documentation

## 2020-08-25 DIAGNOSIS — N19 Unspecified kidney failure: Secondary | ICD-10-CM | POA: Diagnosis not present

## 2020-08-25 DIAGNOSIS — E119 Type 2 diabetes mellitus without complications: Secondary | ICD-10-CM

## 2020-08-25 DIAGNOSIS — Z8616 Personal history of COVID-19: Secondary | ICD-10-CM | POA: Diagnosis not present

## 2020-08-25 DIAGNOSIS — E1122 Type 2 diabetes mellitus with diabetic chronic kidney disease: Secondary | ICD-10-CM | POA: Insufficient documentation

## 2020-08-25 DIAGNOSIS — Z87891 Personal history of nicotine dependence: Secondary | ICD-10-CM | POA: Insufficient documentation

## 2020-08-25 DIAGNOSIS — I120 Hypertensive chronic kidney disease with stage 5 chronic kidney disease or end stage renal disease: Secondary | ICD-10-CM | POA: Diagnosis not present

## 2020-08-25 DIAGNOSIS — Z9115 Patient's noncompliance with renal dialysis: Secondary | ICD-10-CM

## 2020-08-25 DIAGNOSIS — E872 Acidosis, unspecified: Secondary | ICD-10-CM | POA: Diagnosis present

## 2020-08-25 DIAGNOSIS — R5383 Other fatigue: Secondary | ICD-10-CM | POA: Diagnosis present

## 2020-08-25 DIAGNOSIS — D638 Anemia in other chronic diseases classified elsewhere: Secondary | ICD-10-CM

## 2020-08-25 DIAGNOSIS — Z8551 Personal history of malignant neoplasm of bladder: Secondary | ICD-10-CM | POA: Diagnosis not present

## 2020-08-25 DIAGNOSIS — Z20822 Contact with and (suspected) exposure to covid-19: Secondary | ICD-10-CM | POA: Insufficient documentation

## 2020-08-25 DIAGNOSIS — Z992 Dependence on renal dialysis: Secondary | ICD-10-CM | POA: Insufficient documentation

## 2020-08-25 LAB — CBC
HCT: 29.8 % — ABNORMAL LOW (ref 39.0–52.0)
Hemoglobin: 9.4 g/dL — ABNORMAL LOW (ref 13.0–17.0)
MCH: 28.7 pg (ref 26.0–34.0)
MCHC: 31.5 g/dL (ref 30.0–36.0)
MCV: 90.9 fL (ref 80.0–100.0)
Platelets: 286 10*3/uL (ref 150–400)
RBC: 3.28 MIL/uL — ABNORMAL LOW (ref 4.22–5.81)
RDW: 14.6 % (ref 11.5–15.5)
WBC: 8.4 10*3/uL (ref 4.0–10.5)
nRBC: 0 % (ref 0.0–0.2)

## 2020-08-25 LAB — BASIC METABOLIC PANEL
Anion gap: 11 (ref 5–15)
BUN: 153 mg/dL — ABNORMAL HIGH (ref 8–23)
CO2: 10 mmol/L — ABNORMAL LOW (ref 22–32)
Calcium: 7.4 mg/dL — ABNORMAL LOW (ref 8.9–10.3)
Chloride: 115 mmol/L — ABNORMAL HIGH (ref 98–111)
Creatinine, Ser: 9.37 mg/dL — ABNORMAL HIGH (ref 0.61–1.24)
GFR, Estimated: 6 mL/min — ABNORMAL LOW (ref >60)
Glucose, Bld: 129 mg/dL — ABNORMAL HIGH (ref 70–99)
Potassium: 5.6 mmol/L — ABNORMAL HIGH (ref 3.5–5.1)
Sodium: 136 mmol/L (ref 135–145)

## 2020-08-25 LAB — TROPONIN I (HIGH SENSITIVITY)
Troponin I (High Sensitivity): 13 ng/L (ref <18)
Troponin I (High Sensitivity): 12 ng/L (ref <18)

## 2020-08-25 LAB — GLUCOSE, CAPILLARY: Glucose-Capillary: 97 mg/dL (ref 70–99)

## 2020-08-25 MED ORDER — PANTOPRAZOLE SODIUM 40 MG PO TBEC
40.0000 mg | DELAYED_RELEASE_TABLET | Freq: Two times a day (BID) | ORAL | Status: DC
Start: 2020-08-25 — End: 2020-08-26
  Administered 2020-08-25 – 2020-08-26 (×2): 40 mg via ORAL
  Filled 2020-08-25 (×2): qty 1

## 2020-08-25 MED ORDER — HEPARIN SODIUM (PORCINE) 5000 UNIT/ML IJ SOLN
5000.0000 [IU] | Freq: Three times a day (TID) | INTRAMUSCULAR | Status: DC
  Administered 2020-08-25 – 2020-08-26 (×3): 5000 [IU] via SUBCUTANEOUS
  Filled 2020-08-25 (×3): qty 1

## 2020-08-25 MED ORDER — VENLAFAXINE HCL ER 75 MG PO CP24
150.0000 mg | ORAL_CAPSULE | Freq: Every day | ORAL | Status: DC
  Administered 2020-08-26: 150 mg via ORAL
  Filled 2020-08-25: qty 1
  Filled 2020-08-25: qty 2

## 2020-08-25 MED ORDER — ONDANSETRON HCL 4 MG/2ML IJ SOLN: 4.0000 mg | Freq: Four times a day (QID) | INTRAMUSCULAR | Status: DC | PRN

## 2020-08-25 MED ORDER — SODIUM BICARBONATE 650 MG PO TABS
650.0000 mg | ORAL_TABLET | Freq: Two times a day (BID) | ORAL | Status: DC
  Administered 2020-08-25 – 2020-08-26 (×2): 650 mg via ORAL
  Filled 2020-08-25 (×2): qty 1

## 2020-08-25 MED ORDER — INSULIN ASPART 100 UNIT/ML IJ SOLN: 0.0000 [IU] | Freq: Three times a day (TID) | INTRAMUSCULAR | Status: DC

## 2020-08-25 MED ORDER — ALBUTEROL SULFATE HFA 108 (90 BASE) MCG/ACT IN AERS: 2.0000 | INHALATION_SPRAY | RESPIRATORY_TRACT | Status: DC | PRN

## 2020-08-25 MED ORDER — ACETAMINOPHEN 325 MG PO TABS: 650.0000 mg | ORAL_TABLET | Freq: Four times a day (QID) | ORAL | Status: DC | PRN

## 2020-08-25 MED ORDER — ALBUTEROL SULFATE HFA 108 (90 BASE) MCG/ACT IN AERS
2.0000 | INHALATION_SPRAY | Freq: Four times a day (QID) | RESPIRATORY_TRACT | Status: DC | PRN
  Filled 2020-08-25: qty 6.7

## 2020-08-25 MED ORDER — SODIUM ZIRCONIUM CYCLOSILICATE 10 G PO PACK
10.0000 g | PACK | Freq: Once | ORAL | Status: AC
  Administered 2020-08-25: 10 g via ORAL
  Filled 2020-08-25: qty 1

## 2020-08-25 MED ORDER — ACETAMINOPHEN 650 MG RE SUPP
650.0000 mg | Freq: Four times a day (QID) | RECTAL | Status: DC | PRN
Start: 2020-08-25 — End: 2020-08-26

## 2020-08-25 MED ORDER — ONDANSETRON HCL 4 MG PO TABS: 4.0000 mg | ORAL_TABLET | Freq: Four times a day (QID) | ORAL | Status: DC | PRN

## 2020-08-25 MED ORDER — AMLODIPINE BESYLATE 10 MG PO TABS
10.0000 mg | ORAL_TABLET | Freq: Every day | ORAL | Status: DC
  Administered 2020-08-26: 10 mg via ORAL
  Filled 2020-08-25: qty 1

## 2020-08-25 MED ORDER — CINACALCET HCL 30 MG PO TABS: 30.0000 mg | ORAL_TABLET | ORAL | Status: DC

## 2020-08-25 NOTE — ED Notes (Signed)
RN attempted to call report x1 

## 2020-08-25 NOTE — ED Notes (Signed)
RN messaged pharmacy for verification of upcoming due medications

## 2020-08-25 NOTE — ED Triage Notes (Signed)
Pt c/o SOB for a week or so" went to [facility] family practice today and they were unable to get an Oxygen reading, sent pt here for further evaluation. Pt is no distresses, respiratory rate even and unlabored

## 2020-08-25 NOTE — H&P (Signed)
History and Physical    [name] [name] [id number]:[id number] DOB: 1960/07/19 DOA: 08/25/2020  PCP: Nickola Major, MD   Patient coming from: Home   Chief Complaint: SOB, nausea, loss of appetite, fatigue   HPI: [name] [name] is a 60 y.o. male with medical history significant for bladder cancer status post resection and neobladder in 2015, left nephrectomy, ESRD, hypertension, diet-controlled diabetes mellitus, and depression, now presenting to the emergency department for evaluation of shortness of breath, nausea, loss of appetite, and fatigue worsening over the past couple weeks.  Patient reports that he was under the impression he may not need dialysis anymore, had some blood and urine test done couple months ago, states that he was waiting to hear the results of these, and has not been to dialysis since then.  He denies any abdominal pain but has had nausea and a couple episodes of vomiting.  No diarrhea.  Denies any fevers, chills, chest pain, or significant cough.  He was hoping that he would not need dialysis anymore but wants to resume if medically necessary.  He initially went to an outpatient clinic for evaluation of the symptoms and was directed to the ED for further evaluation.  ED Course: Upon arrival to the ED, patient is found to be afebrile, saturating well on room air, and with stable blood pressure.  Chest x-ray is negative for acute findings and EKG features a sinus rhythm.  Chemistry panel notable for potassium 5.6, bicarbonate 10, BUN 153, and calcium 7.4.  CBC with stable normocytic anemia.  Troponin is normal.  Nephrology was consulted by the ED physician and [facility] and medical admission for likely resumption of dialysis was recommended.  Review of Systems:  All other systems reviewed and apart from HPI, are negative.  Past Medical History:  Diagnosis Date  . Allergy   . Anemia   . Anemia in chronic kidney disease (CKD) 08/12/2016  . Anxiety   . Bladder cancer  ([place]) 2013  . Blood transfusion without reported diagnosis   . Coagulation defect, unspecified ([place]) 08/26/2016  . Complication of anesthesia    agitation w/awakening in 9/13,OK 01/24/12  . Depression   . Diabetes mellitus type 2, diet-controlled ([place]) PT STOPPED TAKING METFORMIN--  HAS BEEN WATCHING DIET, EXERCISING AND LOSING WT  . ESRD (end stage renal disease) ([place]) 08/11/2016  . Frequency of urination   . GERD (gastroesophageal reflux disease)   . Gout    recent flair -bilateral feet--  STABLE  . Hyperkalemia 08/12/2016  . Hypertension   . Hypertensive kidney disease with CKD (chronic kidney disease)   . Nocturia   . Non-compliance with renal dialysis ([place])   . Secondary hyperparathyroidism of renal origin ([place]) 08/12/2016  . Urgency incontinence     Past Surgical History:  Procedure Laterality Date  . AV FISTULA PLACEMENT Left 05/31/2014   Procedure: LEFT RADIOCEPHALIC ARTERIOVENOUS (AV) FISTULA CREATION ;  Surgeon: Mal Misty, MD;  Location: [place];  Service: Vascular;  Laterality: Left;  . BACK SURGERY    . BLADDER SURGERY  04/26/2013   at the cancer [facility] [place] in  [place]    . COLONOSCOPY    . CYSTOSCOPY W/ RETROGRADES Bilateral 07/10/2012   Procedure: CYSTOSCOPY WITH RETROGRADE PYELOGRAM;  Surgeon: Molli Hazard, MD;  [facility];  Service: Urology;  Laterality: Bilateral;  . CYSTOSCOPY WITH URETHRAL DILATATION  01/24/2012   Procedure: CYSTOSCOPY WITH URETHRAL DILATATION;  Surgeon: Molli Hazard, MD;  Location:  [place];  Service: Urology;  Laterality: N/A;  . IR NEPHROSTOMY PLACEMENT LEFT  07/16/2016  . LUMBAR LAMINECTOMY  06-02-2005   W/ RESECTION NERVE ROOT  L4  -  L5  . NEPHROSTOMY TUBE PLACEMENT ([place] HX)  2015   right   . TRANSURETHRAL RESECTION OF BLADDER TUMOR  12/15/2011   Procedure: TRANSURETHRAL RESECTION OF BLADDER TUMOR (TURBT);  Surgeon: Molli Hazard, MD;  Location: [facility];  Service: Urology;  Laterality: N/A;  2 HRS   . TRANSURETHRAL RESECTION OF BLADDER TUMOR  01/24/2012   Procedure: TRANSURETHRAL RESECTION OF BLADDER TUMOR (TURBT);  Surgeon: Molli Hazard, MD;  Location[facility];  Service: Urology;  Laterality: N/A;  90 MIN   . TRANSURETHRAL RESECTION OF BLADDER TUMOR N/A 07/10/2012   Procedure: TRANSURETHRAL RESECTION OF BLADDER TUMOR (TURBT);  Surgeon: Molli Hazard, MD;  Location: [facility];  Service: Urology;  Laterality: N/A;  . ulner artery repair Right 2009   rt -trauma /thrombectomy,repair    Social History:   reports that he quit smoking about 28 years ago. His smoking use included cigarettes. He has a 16.00 pack-year smoking history. He quit smokeless tobacco use about 28 years ago.  His smokeless tobacco use included chew. He reports that he does not drink alcohol and does not use drugs.  Allergies  Allergen Reactions  . Lisinopril Cough    Family History  Problem Relation Age of Onset  . Heart disease Mother   . Hypertension Other   . Diabetes Maternal Grandmother   . Brain cancer Paternal Uncle   . Bladder Cancer Neg Hx   . Colon cancer Neg Hx   . Esophageal cancer Neg Hx   . Rectal cancer Neg Hx   . Stomach cancer Neg Hx      Prior to Admission medications   Medication Sig Start Date End Date Taking? Authorizing Provider  amLODipine (NORVASC) 10 MG tablet Take 10 mg by mouth daily. 03/22/19   [provider]  AURYXIA 1 GM 210 MG(Fe) tablet Take 420 mg by mouth daily.  04/14/18   [provider]  cinacalcet (SENSIPAR) 30 MG tablet Take 30 mg by mouth every Monday, Wednesday, and Friday.    [provider]  pantoprazole (PROTONIX) 40 MG tablet Take 40 mg by mouth 2 (two) times daily. 05/11/14   [provider]  rosuvastatin (CRESTOR) 5 MG tablet Take 5 mg by mouth daily. Patient not taking: Reported on 05/06/2020 05/26/19    [provider]  sevelamer carbonate (RENVELA) 800 MG tablet Take 800 mg by mouth 3 (three) times daily.  Patient not taking: Reported on 05/06/2020 08/11/16   [provider]  sodium bicarbonate 650 MG tablet Take 650 mg by mouth 2 (two) times daily.    [provider]  venlafaxine XR (EFFEXOR-XR) 150 MG 24 hr capsule Take 1 capsule (150 mg total) by mouth daily with breakfast. 07/06/17   Ursula Alert, MD  QUEtiapine (SEROQUEL) 25 MG tablet Take 1 tablet (25 mg total) by mouth at bedtime. Patient not taking: Reported on 05/04/2018 07/06/17 05/27/19  Ursula Alert, MD    Physical Exam: Vitals:   08/25/20 1816 08/25/20 1900 08/25/20 1915 08/25/20 2000  BP:  116/75 (!) 153/65 (!) 151/60  Pulse:  80 78 80  Resp:  14 13 15   Temp:      TempSrc:      SpO2: 100% 100% 100% 100%  Constitutional: NAD, calm  Eyes: PERTLA, lids and conjunctivae normal ENMT: Mucous membranes are moist. Posterior pharynx clear of any exudate or lesions.   Neck: supple, no masses  Respiratory:  no wheezing, no crackles. No accessory muscle use.  Cardiovascular: S1 & S2 heard, regular rate and rhythm. No extremity edema.  Abdomen: No distension, no tenderness, soft. Bowel sounds active.  Musculoskeletal: no clubbing / cyanosis. No joint deformity upper and lower extremities.   Skin: no significant rashes, lesions, ulcers. Warm, dry, well-perfused. Neurologic: CN 2-12 grossly intact. Sensation intact. Moving all extremities.  Psychiatric: Alert and oriented to person, place, and situation. Pleasant and cooperative.    Labs and Imaging on Admission: I have personally reviewed following labs and imaging studies  CBC: Recent Labs  Lab 08/25/20 1706  WBC 8.4  HGB 9.4*  HCT 29.8*  MCV 90.9  PLT 754   Basic Metabolic Panel: Recent Labs  Lab 08/25/20 1706  NA 136  K 5.6*  CL 115*  CO2 10*  GLUCOSE 129*  BUN 153*  CREATININE 9.37*  CALCIUM 7.4*   GFR: CrCl cannot be  calculated (Unknown ideal weight.). Liver Function Tests: No results for input(s): AST, ALT, ALKPHOS, BILITOT, PROT, ALBUMIN in the last 168 hours. No results for input(s): LIPASE, AMYLASE in the last 168 hours. No results for input(s): AMMONIA in the last 168 hours. Coagulation Profile: No results for input(s): INR, PROTIME in the last 168 hours. Cardiac Enzymes: No results for input(s): CKTOTAL, CKMB, CKMBINDEX, TROPONINI in the last 168 hours. BNP (last 3 results) No results for input(s): PROBNP in the last 8760 hours. HbA1C: No results for input(s): HGBA1C in the last 72 hours. CBG: No results for input(s): GLUCAP in the last 168 hours. Lipid Profile: No results for input(s): CHOL, HDL, LDLCALC, TRIG, CHOLHDL, LDLDIRECT in the last 72 hours. Thyroid Function Tests: No results for input(s): TSH, T4TOTAL, FREET4, T3FREE, THYROIDAB in the last 72 hours. Anemia Panel: No results for input(s): VITAMINB12, FOLATE, FERRITIN, TIBC, IRON, RETICCTPCT in the last 72 hours. Urine analysis:    Component Value Date/Time   COLORURINE YELLOW 11/10/2016 0956   APPEARANCEUR TURBID (A) 11/10/2016 0956   LABSPEC 1.010 11/10/2016 0956   PHURINE 7.5 11/10/2016 0956   GLUCOSEU NEGATIVE 11/10/2016 0956   HGBUR LARGE (A) 11/10/2016 0956   BILIRUBINUR LARGE (A) 11/10/2016 0956   BILIRUBINUR small 08/29/2013 1836   KETONESUR NEGATIVE 11/10/2016 0956   PROTEINUR 30 (A) 11/10/2016 0956   UROBILINOGEN 1.0 10/19/2014 2045   NITRITE NEGATIVE 11/10/2016 0956   LEUKOCYTESUR LARGE (A) 11/10/2016 0956   Sepsis Labs: @LABRCNTIP (procalcitonin:4,lacticidven:4) )No results found for this or any previous visit (from the past 240 hour(s)).   Radiological Exams on Admission: DG Chest 2 View  Result Date: 08/25/2020 CLINICAL DATA:  Shortness of breath EXAM: CHEST - 2 VIEW COMPARISON:  05/06/2020 FINDINGS: The heart size and mediastinal contours are within normal limits. Both lungs are clear. The visualized  skeletal structures are unremarkable. IMPRESSION: No acute abnormality of the lungs. Electronically Signed   By: Eddie Candle M.D.   On: 08/25/2020 17:43    EKG: Independently reviewed. Sinus rhythm.   Assessment/Plan   1. ESRD; uremia; acidosis; hyperkalemia  - ESRD patient presenting with SOB, fatigue, loss of appetite, and nausea after not going to dialysis for close to 2 months, explaining that he thought he may not need it anymore  - He is found to have serum bicarb of 10, BUN 153, and potassium of 5.6; SBP  120-160, not significantly hypervolemic  - Nephrology was contacted by ED physician and recommended [facility] and likely resumption of HD in hospital  - Treat with [facility], continue bicarbonate, renally-dose medications, monitor chemistries    2. Hypertension  - Continue Norvasc    3. Type II DM  - A1c was 6.4% In February 2022  - Has been diet-controlled  - Monitor CBGs, treat if needed    4. Depression  - Continue Effexor   5. Anemia  - Appears stable, no bleeding     DVT prophylaxis: sq heparin  Code Status: Full  Level of Care: Level of care: Telemetry Medical Family Communication: None present  Disposition Plan:  Patient is from: home  Anticipated d/c is to: Home Anticipated d/c date is: Possibly as early as 08/26/20 Patient currently: Likely needs HD in hospital  Consults called: Nephrology consulted by ED physician  Admission status: Observation     Vianne Bulls, MD [facility]  08/25/2020, 8:43 PM

## 2020-08-25 NOTE — Plan of Care (Signed)

## 2020-08-25 NOTE — ED Notes (Signed)
ED Provider at bedside. 

## 2020-08-25 NOTE — ED Provider Notes (Signed)
[facility] EMERGENCY DEPARTMENT Provider Note   CSN: [id number] Arrival date & time: 08/25/20  1543     History Chief Complaint  Patient presents with  . Shortness of Breath    [name] [name] is a 60 y.o. male.  60 year old male with prior medical history as detailed below presents for evaluation.  Patient was seen by PCP at [facility] earlier today.  Patient presented with complaint of intermittent fatigue and dyspnea.  The symptoms have been ongoing for several weeks.  Reports at this time to this provider that he is feeling "fine".  Apparently, earlier today it was difficult to obtain a pulse ox on the patient.  Patient with rapid antigen test performed earlier today was negative for COVID.  Patient with prior documented COVID infection in January 2022.  Patient with history of ESRD on HD.  He is not currently on HD.  He does have an AV fistula in his left arm.  He denies associated chest pain, diaphoresis, nausea, vomiting, focal weakness, visual changes, or other acute complaint.  Patient reports that he has never been diagnosed with a PE.  Patient reports that he has never been on anticoagulation.  The history is provided by the patient and medical records.  Shortness of Breath Severity:  Mild Onset quality:  Gradual Duration:  4 weeks Timing:  Intermittent Progression:  Waxing and waning Chronicity:  Chronic Relieved by:  Nothing Worsened by:  Nothing      Past Medical History:  Diagnosis Date  . Allergy   . Anemia   . Anemia in chronic kidney disease (CKD) 08/12/2016  . Anxiety   . Bladder cancer ([place]) 2013  . Blood transfusion without reported diagnosis   . Coagulation defect, unspecified ([place]) 08/26/2016  . Complication of anesthesia    agitation w/awakening in 9/13,OK 01/24/12  . Depression   . Diabetes mellitus type 2, diet-controlled ([place]) PT STOPPED TAKING METFORMIN--  HAS BEEN WATCHING DIET, EXERCISING AND LOSING WT  . ESRD  (end stage renal disease) ([place]) 08/11/2016  . Frequency of urination   . GERD (gastroesophageal reflux disease)   . Gout    recent flair -bilateral feet--  STABLE  . Hyperkalemia 08/12/2016  . Hypertension   . Hypertensive kidney disease with CKD (chronic kidney disease)   . Nocturia   . Non-compliance with renal dialysis ([place])   . Secondary hyperparathyroidism of renal origin ([place]) 08/12/2016  . Urgency incontinence     Patient Active Problem List   Diagnosis Date Noted  . Pneumonia due to COVID-19 virus 05/06/2020  . Hyperkalemia 09/14/2017  . Abnormal EKG 09/14/2017  . Non-compliance with renal dialysis ([place])   . ESRD needing dialysis ([place]) 11/26/2016  . Vitamin B12 deficiency 11/19/2016  . GERD (gastroesophageal reflux disease) 11/16/2016  . Suicidal ideation 11/16/2016  . Pyelonephritis 10/20/2015  . Hydronephrosis 10/20/2015  . E-coli UTI   . Anemia of chronic disease   . Acute encephalopathy 10/19/2014  . Altered mental status 10/19/2014  . Faintness   . Syncope 07/06/2014  . Syncope and collapse 07/05/2014  . CKD (chronic kidney disease), stage IV ([place]) 07/05/2014  . Acute coccygeal pain 07/05/2014  . Occipital scalp laceration 07/05/2014  . Diabetes mellitus type 2, diet-controlled ([place]) 07/05/2014  . Protein calorie malnutrition ([place]) 11/29/2013  . AKI (acute kidney injury) ([place]) 10/30/2013  . Hypercalcemia 06/26/2013  . UTI (lower urinary tract infection) 06/25/2013  . Bladder cancer ([place]) 05/09/2013  . Major depressive disorder, recurrent severe without psychotic  features ([place]) 09/03/2005    Past Surgical History:  Procedure Laterality Date  . AV FISTULA PLACEMENT Left 05/31/2014   Procedure: LEFT RADIOCEPHALIC ARTERIOVENOUS (AV) FISTULA CREATION ;  Surgeon: Mal Misty, MD;  Location: [facility];  Service: Vascular;  Laterality: Left;  . BACK SURGERY    . BLADDER SURGERY  04/26/2013   at the cancer treatment center of [place] in  [place]    . COLONOSCOPY     . CYSTOSCOPY W/ RETROGRADES Bilateral 07/10/2012   Procedure: CYSTOSCOPY WITH RETROGRADE PYELOGRAM;  Surgeon: Molli Hazard, MD;  Location[facility];  Service: Urology;  Laterality: Bilateral;  . CYSTOSCOPY WITH URETHRAL DILATATION  01/24/2012   Procedure: CYSTOSCOPY WITH URETHRAL DILATATION;  Surgeon: Molli Hazard, MD;  Location: [facility];  Service: Urology;  Laterality: N/A;  . IR NEPHROSTOMY PLACEMENT LEFT  07/16/2016  . LUMBAR LAMINECTOMY  06-02-2005   W/ RESECTION NERVE ROOT  L4  -  L5  . NEPHROSTOMY TUBE PLACEMENT ([place] [facility])  2015   right   . TRANSURETHRAL RESECTION OF BLADDER TUMOR  12/15/2011   Procedure: TRANSURETHRAL RESECTION OF BLADDER TUMOR (TURBT);  Surgeon: Molli Hazard, MD;  Location: [facility];  Service: Urology;  Laterality: N/A;  2 HRS   . TRANSURETHRAL RESECTION OF BLADDER TUMOR  01/24/2012   Procedure: TRANSURETHRAL RESECTION OF BLADDER TUMOR (TURBT);  Surgeon: Molli Hazard, MD;  Location[facility];  Service: Urology;  Laterality: N/A;  90 MIN   . TRANSURETHRAL RESECTION OF BLADDER TUMOR N/A 07/10/2012   Procedure: TRANSURETHRAL RESECTION OF BLADDER TUMOR (TURBT);  Surgeon: Molli Hazard, MD;  Location: [facility];  Service: Urology;  Laterality: N/A;  . ulner artery repair Right 2009   rt -trauma /thrombectomy,repair       Family History  Problem Relation Age of Onset  . Heart disease Mother   . Hypertension Other   . Diabetes Maternal Grandmother   . Brain cancer Paternal Uncle   . Bladder Cancer Neg Hx   . Colon cancer Neg Hx   . Esophageal cancer Neg Hx   . Rectal cancer Neg Hx   . Stomach cancer Neg Hx     Social History   Tobacco Use  . Smoking status: Former Smoker    Packs/day: 1.00    Years: 16.00    Pack years: 16.00    Types: Cigarettes    Quit date: 01/20/1992    Years since quitting: 28.6  . Smokeless tobacco:  Former Systems developer    Types: [place] date: 01/20/1992  Vaping Use  . Vaping Use: Never used  Substance Use Topics  . Alcohol use: No    Alcohol/week: 0.0 standard drinks  . Drug use: No    Home Medications Prior to Admission medications   Medication Sig Start Date End Date Taking? Authorizing Provider  amLODipine (NORVASC) 10 MG tablet Take 10 mg by mouth daily. 03/22/19   [provider]  AURYXIA 1 GM 210 MG(Fe) tablet Take 420 mg by mouth daily.  04/14/18   [provider]  cinacalcet (SENSIPAR) 30 MG tablet Take 30 mg by mouth every Monday, Wednesday, and Friday.    [provider]  pantoprazole (PROTONIX) 40 MG tablet Take 40 mg by mouth 2 (two) times daily. 05/11/14   [provider]  rosuvastatin (CRESTOR) 5 MG tablet Take 5 mg by mouth daily. Patient not taking: Reported on 05/06/2020 05/26/19  [provider]  sevelamer carbonate (RENVELA) 800 MG tablet Take 800 mg by mouth 3 (three) times daily.  Patient not taking: Reported on 05/06/2020 08/11/16   [provider]  sodium bicarbonate 650 MG tablet Take 650 mg by mouth 2 (two) times daily.    [provider]  venlafaxine XR (EFFEXOR-XR) 150 MG 24 hr capsule Take 1 capsule (150 mg total) by mouth daily with breakfast. 07/06/17   Ursula Alert, MD  QUEtiapine (SEROQUEL) 25 MG tablet Take 1 tablet (25 mg total) by mouth at bedtime. Patient not taking: Reported on 05/04/2018 07/06/17 05/27/19  Ursula Alert, MD    Allergies    Lisinopril  Review of Systems   Review of Systems  Respiratory: Positive for shortness of breath.   All other systems reviewed and are negative.   Physical Exam Updated Vital Signs BP (!) 152/67 (BP Location: Right Arm)   Pulse 85   Temp (!) 97.5 F (36.4 C) (Oral)   Resp 15   SpO2 100%   Physical Exam Vitals and nursing note reviewed.  Constitutional:      General: He is not in acute distress.    Appearance: He is well-developed.      Comments: Pulse ox obtained on RA from patient's right index finger at rest - 100%.  HENT:     Head: Normocephalic and atraumatic.  Eyes:     Conjunctiva/sclera: Conjunctivae normal.     Pupils: Pupils are equal, round, and reactive to light.  Cardiovascular:     Rate and Rhythm: Normal rate and regular rhythm.     Heart sounds: Normal heart sounds.  Pulmonary:     Effort: Pulmonary effort is normal. No respiratory distress.     Breath sounds: Normal breath sounds.  Abdominal:     General: There is no distension.     Palpations: Abdomen is soft.     Tenderness: There is no abdominal tenderness.  Musculoskeletal:        General: No deformity. Normal range of motion.     Cervical back: Normal range of motion and neck supple.  Skin:    General: Skin is warm and dry.  Neurological:     Mental Status: He is alert and oriented to person, place, and time.     ED Results / Procedures / Treatments   Labs (all labs ordered are listed, but only abnormal results are displayed) Labs Reviewed  CBC  BASIC METABOLIC PANEL  TROPONIN I (HIGH SENSITIVITY)    EKG EKG Interpretation  Date/Time:  Monday Aug 25 2020 17:02:36 EDT Ventricular Rate:  83 PR Interval:  184 QRS Duration: 100 QT Interval:  390 QTC Calculation: 458 R Axis:   46 Text Interpretation: Normal sinus rhythm Normal ECG Confirmed by [name] [phone]) on 08/25/2020 5:54:59 PM   Radiology DG Chest 2 View  Result Date: 08/25/2020 CLINICAL DATA:  Shortness of breath EXAM: CHEST - 2 VIEW COMPARISON:  05/06/2020 FINDINGS: The heart size and mediastinal contours are within normal limits. Both lungs are clear. The visualized skeletal structures are unremarkable. IMPRESSION: No acute abnormality of the lungs. Electronically Signed   By: Eddie Candle M.D.   On: 08/25/2020 17:43    Procedures Procedures   Medications Ordered in ED Medications  albuterol (VENTOLIN HFA) 108 (90 Base) MCG/ACT inhaler 2 puff (has no  administration in time range)    ED Course  I have reviewed the triage vital signs and the nursing notes.  Pertinent labs & imaging  results that were available during my care of the patient were reviewed by me and considered in my medical decision making (see chart for details).    MDM Rules/Calculators/A&P                          MDM  MSE complete  [name] [name] was evaluated in Emergency Department on 08/25/2020 for the symptoms described in the history of present illness. He was evaluated in the context of the global COVID-19 pandemic, which necessitated consideration that the patient might be at risk for infection with the SARS-CoV-2 virus that causes COVID-19. Institutional protocols and algorithms that pertain to the evaluation of patients at risk for COVID-19 are in a state of rapid change based on information released by regulatory bodies including the CDC and federal and state organizations. These policies and algorithms were followed during the patient's care in the ED.  Patient was sent from his primary care office this afternoon for evaluation of reported weakness and fatigue with possible hypoxia.  Patient was noted to be without evidence of hypoxia.  Screening labs obtained reveal evidence of significant uremia.  Patient has apparently not been going to dialysis over the last several weeks.  Case discussed with Dr. Joelyn Oms of nephrology.  Plan to admit to medicine with gentle dialysis initiation tomorrow morning.  We will treat elevated potassium with Lokelma overnight.  Patient understands and agrees with plan of care.  Hospitalist service is aware of case and will evaluate for admission.    Final Clinical Impression(s) / ED Diagnoses Final diagnoses:  Uremia  Hyperkalemia    Rx / DC Orders ED Discharge Orders    None       Valarie Merino, MD 08/25/20 2101

## 2020-08-26 ENCOUNTER — Other Ambulatory Visit (HOSPITAL_COMMUNITY): Payer: Self-pay

## 2020-08-26 ENCOUNTER — Encounter (HOSPITAL_COMMUNITY): Payer: Self-pay

## 2020-08-26 DIAGNOSIS — N186 End stage renal disease: Secondary | ICD-10-CM | POA: Diagnosis not present

## 2020-08-26 DIAGNOSIS — Z992 Dependence on renal dialysis: Secondary | ICD-10-CM | POA: Diagnosis not present

## 2020-08-26 DIAGNOSIS — E875 Hyperkalemia: Secondary | ICD-10-CM | POA: Diagnosis not present

## 2020-08-26 LAB — CBC
HCT: 26.9 % — ABNORMAL LOW (ref 39.0–52.0)
Hemoglobin: 9 g/dL — ABNORMAL LOW (ref 13.0–17.0)
MCH: 28.9 pg (ref 26.0–34.0)
MCHC: 33.5 g/dL (ref 30.0–36.0)
MCV: 86.5 fL (ref 80.0–100.0)
Platelets: 242 10*3/uL (ref 150–400)
RBC: 3.11 MIL/uL — ABNORMAL LOW (ref 4.22–5.81)
RDW: 14.3 % (ref 11.5–15.5)
WBC: 8.1 10*3/uL (ref 4.0–10.5)
nRBC: 0 % (ref 0.0–0.2)

## 2020-08-26 LAB — GLUCOSE, CAPILLARY
Glucose-Capillary: 83 mg/dL (ref 70–99)
Glucose-Capillary: 116 mg/dL — ABNORMAL HIGH (ref 70–99)
Glucose-Capillary: 77 mg/dL (ref 70–99)

## 2020-08-26 LAB — SARS CORONAVIRUS 2 (TAT 6-24 HRS): SARS Coronavirus 2: NEGATIVE

## 2020-08-26 LAB — BASIC METABOLIC PANEL
Anion gap: 13 (ref 5–15)
BUN: 157 mg/dL — ABNORMAL HIGH (ref 8–23)
CO2: 8 mmol/L — ABNORMAL LOW (ref 22–32)
Calcium: 7.4 mg/dL — ABNORMAL LOW (ref 8.9–10.3)
Chloride: 114 mmol/L — ABNORMAL HIGH (ref 98–111)
Creatinine, Ser: 9.48 mg/dL — ABNORMAL HIGH (ref 0.61–1.24)
GFR, Estimated: 6 mL/min — ABNORMAL LOW (ref >60)
Glucose, Bld: 89 mg/dL (ref 70–99)
Potassium: 4.2 mmol/L (ref 3.5–5.1)
Sodium: 135 mmol/L (ref 135–145)

## 2020-08-26 LAB — HEPATITIS B SURFACE ANTIGEN: Hepatitis B Surface Ag: NONREACTIVE

## 2020-08-26 LAB — HEPATITIS B CORE ANTIBODY, TOTAL: Hep B Core Total Ab: NONREACTIVE

## 2020-08-26 LAB — PHOSPHORUS: Phosphorus: 7.6 mg/dL — ABNORMAL HIGH (ref 2.5–4.6)

## 2020-08-26 LAB — HIV ANTIBODY (ROUTINE TESTING W REFLEX): HIV Screen 4th Generation wRfx: NONREACTIVE

## 2020-08-26 LAB — HEPATITIS C ANTIBODY: HCV Ab: NONREACTIVE

## 2020-08-26 MED ORDER — CHLORHEXIDINE GLUCONATE CLOTH 2 % EX PADS: 6.0000 | MEDICATED_PAD | Freq: Every day | CUTANEOUS | Status: DC

## 2020-08-26 MED ORDER — DARBEPOETIN ALFA 100 MCG/0.5ML IJ SOSY: 100.0000 ug | PREFILLED_SYRINGE | Freq: Once | INTRAMUSCULAR | Status: DC

## 2020-08-26 MED ORDER — SODIUM BICARBONATE 650 MG PO TABS
650.0000 mg | ORAL_TABLET | Freq: Two times a day (BID) | ORAL | 0 refills | Status: AC
  Filled 2020-08-26: qty 60, 30d supply, fill #0

## 2020-08-26 NOTE — Discharge Summary (Signed)
Physician Discharge Summary  [name] [name] ZOX:[id number] DOB: [birth year] DOA: 08/25/2020  PCP: Nickola Major, MD  Admit date: 08/25/2020 Discharge date: 08/26/2020  Admitted From: Home Disposition:  Home  Recommendations for Outpatient Follow-up:  1. Follow up with PCP in 1-2 weeks 2. Follow up with scheduled HD  Discharge Condition:Stable CODE STATUS:Full Diet recommendation: Diabetic, renal   Brief/Interim Summary: 60 y.o.malewith medical history significant forbladder cancer status post resection and neobladder in 2015, left nephrectomy, ESRD, hypertension, diet-controlled diabetes mellitus, and depression presented to ED with complaints of sob, nausea and fatigue. Pt reportedly stopped HD over the past at least 2 months, thinking he no longer required HD  Pt presented with sob. Nephrology was consulted  Discharge Diagnoses:  Principal Problem:   ESRD needing dialysis [facility]) Active Problems:   Hypertension   Metabolic acidosis   Diabetes mellitus type 2, diet-controlled ([place])   Anemia of chronic disease   Major depressive disorder, recurrent severe without psychotic features ([place])   Hyperkalemia   Non-compliance with renal dialysis ([place])   Uremia  1. ESRD; uremia; acidosis; hyperkalemia  - ESRD patient presenting with SOB, fatigue, loss of appetite, and nausea after not going to dialysis for close to 2 months, explaining that he thought he may not need it anymore  - He is found to have serum bicarb of 10, BUN 153, and potassium of 5.6; SBP 120-160, not significantly hypervolemic  - Nephrology was consulted. Initial plan was for in-house HD -Pt was ultimately set up for outpt HD for 5/25. Discussed with Dr. Augustin Coupe as well as Renal Navigator. OK to d/c today with pt to f/u for outpt HD tomorrow  2. Hypertension  - Coninued Norvasc   - Would continue beta blocker per home regimen  3. Type II DM  - A1c was 6.4% In February 2022  - Reportedly has been  diet-controlled   4. Depression  - Continued Effexor   5. Anemia  - Appears stable, no bleeding     Discharge Instructions   Allergies as of 08/26/2020      Reactions   Lisinopril Cough      Medication List    STOP taking these medications   sevelamer carbonate 800 MG tablet Commonly known as: RENVELA     TAKE these medications   amLODipine 10 MG tablet Commonly known as: NORVASC Take 10 mg by mouth at bedtime.   Auryxia 1 GM 210 MG(Fe) tablet Generic drug: ferric citrate Take 420 mg by mouth at bedtime.   cinacalcet 30 MG tablet Commonly known as: SENSIPAR Take 30 mg by mouth at bedtime.   metoprolol tartrate 25 MG tablet Commonly known as: LOPRESSOR Take 25 mg by mouth at bedtime.   pantoprazole 40 MG tablet Commonly known as: PROTONIX Take 40 mg by mouth 2 (two) times daily.   rosuvastatin 5 MG tablet Commonly known as: CRESTOR Take 5 mg by mouth daily.   sodium bicarbonate 650 MG tablet Take 1 tablet (650 mg total) by mouth 2 (two) times daily. What changed:   how much to take  when to take this   venlafaxine XR 150 MG 24 hr capsule Commonly known as: EFFEXOR-XR Take 1 capsule (150 mg total) by mouth daily with breakfast. What changed: when to take this       Allergies  Allergen Reactions  . Lisinopril Cough    Consultations:  Nephrology  Procedures/Studies: DG Chest 2 View  Result Date: 08/25/2020 CLINICAL DATA:  Shortness of breath EXAM: CHEST -  2 VIEW COMPARISON:  05/06/2020 FINDINGS: The heart size and mediastinal contours are within normal limits. Both lungs are clear. The visualized skeletal structures are unremarkable. IMPRESSION: No acute abnormality of the lungs. Electronically Signed   By: Eddie Candle M.D.   On: 08/25/2020 17:43    Subjective: Without complaints this AM  Discharge Exam: Vitals:   08/26/20 0831 08/26/20 0835  BP: (!) 150/66 (!) 146/66  Pulse: (!) 113 (!) 118  Resp: 18 (!) 22  Temp: 98.2 F (36.8  C) 98.2 F (36.8 C)  SpO2: 97% 98%   Vitals:   08/25/20 2230 08/25/20 2322 08/26/20 0831 08/26/20 0835  BP: (!) 168/71 (!) 173/78 (!) 150/66 (!) 146/66  Pulse: 89 94 (!) 113 (!) 118  Resp: 16  18 (!) 22  Temp:   98.2 F (36.8 C) 98.2 F (36.8 C)  TempSrc:  Oral Oral Oral  SpO2: 100% 100% 97% 98%    General: Pt is alert, awake, not in acute distress Cardiovascular: RRR, S1/S2 +, no rubs, no gallops Respiratory: CTA bilaterally, no wheezing, no rhonchi Abdominal: Soft, NT, ND, bowel sounds + Extremities: no edema, no cyanosis   The results of significant diagnostics from this hospitalization (including imaging, microbiology, ancillary and laboratory) are listed below for reference.     Microbiology: Recent Results (from the past 240 hour(s))  SARS CORONAVIRUS 2 (TAT 6-24 HRS) Nasopharyngeal Nasopharyngeal Swab     Status: None   Collection Time: 08/25/20  9:12 PM   Specimen: Nasopharyngeal Swab  Result Value Ref Range Status   SARS Coronavirus 2 NEGATIVE NEGATIVE Final    Comment: (NOTE) SARS-CoV-2 target nucleic acids are NOT DETECTED.  The SARS-CoV-2 RNA is generally detectable in upper and lower respiratory specimens during the acute phase of infection. Negative results do not preclude SARS-CoV-2 infection, do not rule out co-infections with other pathogens, and should not be used as the sole basis for treatment or other patient management decisions. Negative results must be combined with clinical observations, patient history, and epidemiological information. The expected result is Negative.  Fact Sheet for Patients: SugarRoll.be  Fact Sheet for Healthcare Providers: https://www.woods-mathews.com/  This test is not yet approved or cleared by the [place] FDA and  has been authorized for detection and/or diagnosis of SARS-CoV-2 by FDA under an Emergency Use Authorization (EUA). This EUA will remain  in effect  (meaning this test can be used) for the duration of the COVID-19 declaration under Se ction 564(b)(1) of the Act, 21 U.S.C. section 360bbb-3(b)(1), unless the authorization is terminated or revoked sooner.  Performed at [facility], [place] [address] [place], [place] [address]      Labs: BNP (last 3 results) No results for input(s): BNP in the last 8760 hours. Basic Metabolic Panel: Recent Labs  Lab 08/25/20 1706 08/26/20 0048 08/26/20 0839  NA 136 135  --   K 5.6* 4.2  --   CL 115* 114*  --   CO2 10* 8*  --   GLUCOSE 129* 89  --   BUN 153* 157*  --   CREATININE 9.37* 9.48*  --   CALCIUM 7.4* 7.4*  --   PHOS  --   --  7.6*   Liver Function Tests: No results for input(s): AST, ALT, ALKPHOS, BILITOT, PROT, ALBUMIN in the last 168 hours. No results for input(s): LIPASE, AMYLASE in the last 168 hours. No results for input(s): AMMONIA in the last 168 hours. CBC: Recent Labs  Lab 08/25/20 1706  08/26/20 0048  WBC 8.4 8.1  HGB 9.4* 9.0*  HCT 29.8* 26.9*  MCV 90.9 86.5  PLT 286 242   Cardiac Enzymes: No results for input(s): CKTOTAL, CKMB, CKMBINDEX, TROPONINI in the last 168 hours. BNP: Invalid input(s): POCBNP CBG: Recent Labs  Lab 08/25/20 2344 08/26/20 0751 08/26/20 1151  GLUCAP 97 83 116*   D-Dimer No results for input(s): DDIMER in the last 72 hours. Hgb A1c No results for input(s): HGBA1C in the last 72 hours. Lipid Profile No results for input(s): CHOL, HDL, LDLCALC, TRIG, CHOLHDL, LDLDIRECT in the last 72 hours. Thyroid function studies No results for input(s): TSH, T4TOTAL, T3FREE, THYROIDAB in the last 72 hours.  Invalid input(s): FREET3 Anemia work up No results for input(s): VITAMINB12, FOLATE, FERRITIN, TIBC, IRON, RETICCTPCT in the last 72 hours. Urinalysis    Component Value Date/Time   COLORURINE YELLOW 11/10/2016 0956   APPEARANCEUR TURBID (A) 11/10/2016 0956   LABSPEC 1.010 11/10/2016 0956   PHURINE 7.5 11/10/2016 0956    GLUCOSEU NEGATIVE 11/10/2016 0956   HGBUR LARGE (A) 11/10/2016 0956   BILIRUBINUR LARGE (A) 11/10/2016 0956   BILIRUBINUR small 08/29/2013 1836   KETONESUR NEGATIVE 11/10/2016 0956   PROTEINUR 30 (A) 11/10/2016 0956   UROBILINOGEN 1.0 10/19/2014 2045   NITRITE NEGATIVE 11/10/2016 0956   LEUKOCYTESUR LARGE (A) 11/10/2016 0956   Sepsis Labs Invalid input(s): PROCALCITONIN,  WBC,  LACTICIDVEN Microbiology Recent Results (from the past 240 hour(s))  SARS CORONAVIRUS 2 (TAT 6-24 HRS) Nasopharyngeal Nasopharyngeal Swab     Status: None   Collection Time: 08/25/20  9:12 PM   Specimen: Nasopharyngeal Swab  Result Value Ref Range Status   SARS Coronavirus 2 NEGATIVE NEGATIVE Final    Comment: (NOTE) SARS-CoV-2 target nucleic acids are NOT DETECTED.  The SARS-CoV-2 RNA is generally detectable in upper and lower respiratory specimens during the acute phase of infection. Negative results do not preclude SARS-CoV-2 infection, do not rule out co-infections with other pathogens, and should not be used as the sole basis for treatment or other patient management decisions. Negative results must be combined with clinical observations, patient history, and epidemiological information. The expected result is Negative.  Fact Sheet for Patients: SugarRoll.be  Fact Sheet for Healthcare Providers: https://www.woods-mathews.com/  This test is not yet approved or cleared by the [place] FDA and  has been authorized for detection and/or diagnosis of SARS-CoV-2 by FDA under an Emergency Use Authorization (EUA). This EUA will remain  in effect (meaning this test can be used) for the duration of the COVID-19 declaration under Se ction 564(b)(1) of the Act, 21 U.S.C. section 360bbb-3(b)(1), unless the authorization is terminated or revoked sooner.  Performed at [facility], [place] [address], [place], [place] [address]    Time spent: 30  min  SIGNED:   Marylu Lund, MD  [facility] 08/26/2020, 4:27 PM  If 7PM-7AM, please contact night-coverage

## 2020-08-26 NOTE — Progress Notes (Signed)
D/c education provided to pt. Medication education given. IV removed. Pt verbalizes understanding and agreement to report to dialysis center tomorrow at 11:40am to receive HD at 12pm. He has no other questions at this time. Wife will pick pt up after work around [facility]

## 2020-08-26 NOTE — Progress Notes (Signed)
Renal Navigator received message this morning from Dr. Augustin Coupe regarding whether or not patient still has a spot at [facility] for outpatient HD. Navigator has been discussing situation with outpatient Medical Director/Dr. Carolin Sicks, and clinic staff/Clinic Manager at [facility]. Navigator has confirmed that patient's last outpatient HD treatment was on 07/11/20, but that he was not attending his 2 x per week treatments regularly prior to this time. Patient had dropped off a 24 hour urine text last week, so had made contact with his clinic. Both clinic and MD confirmed that he is still a patient there and his seat time was M/F at 12:00pm, 11:40am arrival. Inpatient Nephrologist/Dr. Augustin Coupe has confirmed that there is no question that patient has ESRD (he discussed with patient this morning) and has asked Navigator to arrange 3 x per week outpatient HD at this time. Navigator discussed with clinic and patient will now have a MWF schedule at 12:00pm.  Both Attending and Nephrologist have agreed that patient can discharge today prior to HD as long as he commits to attending outpatient HD tomorrow. Navigator met with patient to discuss importance of compliance, adding that if he is choosing to he dialysis, the prescription will be 3 x per week and that he needs to show up in order for [place] to care for him. Patient states he does not understand why this is the case because "they don't even pull fluid." Navigator replied, "does dialysis clean your blood?" He agreed. Navigator asked that he commit to MWF/3 treatments per week, starting tomorrow, and if he is willing to commit to this, he can be discharged today without waiting on HD here. He agrees. He still questions ongoing 3 x per week. Navigator told patient that he is in a seat that some other patient needs and would show up to. Again, Navigator told patient that we cannot care for him if he is not there. Navigator told him that if he shows 3 x per week compliance that he  and Dr. Carolin Sicks can re-evaluate his need for 3 x per week in the future. Patient agreed. He states his car is at his MD office in [place] and that his wife can come get him after work today. Above discussed with patient's bedside RN. Patient taken off inpt HD schedule today. Renal PA to send outpt orders to [facility] for HD tomorrow.   Alphonzo Cruise, [place] Renal Navigator [phone]

## 2020-08-26 NOTE — Consult Note (Signed)
Reason for Consult: ESRD Referring Physician:  Dr. Myna Hidalgo  Chief Complaint: SOB, anorexia  Dialysis Orders: Center:[facility] Monday/Friday schedule. EDW85kgHD Bath 2K/3.5CaTime 4 hoursHeparin 5k 2500 mid tx. AccessLUE AVFBFR 400DFR 800  Micera 100 mcg IVP every 2 weeks  Assessment/Plan: 1. ESRD-  Notoriously noncompliant at [facility] and missed many treatments usually only going to dialysis 4-6x a mth + signing off early each treatment. - Spent time counseling him and explaining why he is not able to come off dialysis; as well as why he needs to be compliant if a transplant center is going to consider him. - We will dialyze slowly today and tomorrow to decrease the risk of dialysis dysequilibrium given his last treatment was over a month ago on 07/11/20 at which time he also signed off early - Will also d/w renal navigator whether we need to CLIP him again. .  2. Hypertension/volume- appears volume depleted. Encourage po and follow 3. Anemia- Stable, will dose aranesp with HD and also recheck iron panels. 4. Metabolic bone disease- Continue with outpatient meds; check phos 5. Nutrition- Renal diet  HPI: [name] [name] is an 60 y.o. male bladder cancer s/p resection + neobladder 2015, HTN, DM, depression, ESRD presenting with shortness of breath, nausea, anorexia and fatigue over the past few weeks. For some reason he thought he didn't need dialysis anymore. He had urine studies and blood work mths ago, was still waiting to hear the results and has not been to dialysis since. He denies abdominal pain or diarrhea but has had nausea and vomiting as well. He denies fever, chills, myalgias, rigors, productive cough. In the ED he was afebrile, good sats on RA with noted hyperkalemia, metabolic acidosis. He was given [facility], treated for hyperkalemia and admitted. HCO3 was 8 and BUN/Cr was 157/9.48. Hb was 9 and he was also noted to be hypertensive. He also had h/o COVID in  early February 2022.  ROS Pertinent items are noted in HPI.  Chemistry and CBC: Creat  Date/Time Value Ref Range Status  08/29/2013 07:47 PM 2.53 (H) 0.50 - 1.35 mg/dL Final  07/24/2013 07:40 PM 2.56 (H) 0.50 - 1.35 mg/dL Final   Creatinine, Ser  Date/Time Value Ref Range Status  08/26/2020 12:48 AM 9.48 (H) 0.61 - 1.24 mg/dL Final  08/25/2020 05:06 PM 9.37 (H) 0.61 - 1.24 mg/dL Final  05/10/2020 04:26 AM 5.28 (H) 0.61 - 1.24 mg/dL Final  05/09/2020 01:27 AM 7.39 (H) 0.61 - 1.24 mg/dL Final  05/08/2020 01:10 AM 7.15 (H) 0.61 - 1.24 mg/dL Final  05/07/2020 12:55 AM 6.73 (H) 0.61 - 1.24 mg/dL Final  05/06/2020 09:24 AM 6.13 (H) 0.61 - 1.24 mg/dL Final  05/28/2019 08:43 AM 4.53 (H) 0.61 - 1.24 mg/dL Final  05/28/2019 01:15 AM 6.69 (H) 0.61 - 1.24 mg/dL Final  05/27/2019 10:33 AM 6.33 (H) 0.61 - 1.24 mg/dL Final  03/20/2019 06:35 AM 6.31 (H) 0.61 - 1.24 mg/dL Final  09/14/2017 06:55 AM 4.74 (H) 0.61 - 1.24 mg/dL Final    Comment:    DELTA CHECK NOTED  09/14/2017 12:57 AM 7.20 (H) 0.61 - 1.24 mg/dL Final  09/13/2017 06:47 PM 7.63 (H) 0.61 - 1.24 mg/dL Final  11/26/2016 09:28 AM 4.95 (H) 0.61 - 1.24 mg/dL Final  11/25/2016 05:48 PM 4.43 (H) 0.61 - 1.24 mg/dL Final  11/19/2016 02:00 PM 3.97 (H) 0.61 - 1.24 mg/dL Final  11/15/2016 07:30 PM 5.74 (H) 0.61 - 1.24 mg/dL Final  11/15/2016 12:54 PM 5.88 (H) 0.61 - 1.24 mg/dL  Final  11/15/2016 12:54 PM 5.72 (H) 0.61 - 1.24 mg/dL Final  11/10/2016 09:40 AM 5.25 (H) 0.61 - 1.24 mg/dL Final  08/26/2016 03:44 AM 5.90 (H) 0.61 - 1.24 mg/dL Final  08/26/2016 03:32 AM 5.52 (H) 0.61 - 1.24 mg/dL Final  07/16/2016 09:35 AM 2.97 (H) 0.61 - 1.24 mg/dL Final  10/21/2015 04:01 AM 3.02 (H) 0.61 - 1.24 mg/dL Final  10/20/2015 04:43 AM 3.09 (H) 0.61 - 1.24 mg/dL Final  09/29/2015 11:43 AM 2.99 (H) 0.61 - 1.24 mg/dL Final  07/28/2015 08:52 AM 3.13 (H) 0.61 - 1.24 mg/dL Final  05/20/2015 01:17 PM 3.02 (H) 0.61 - 1.24 mg/dL Final  04/08/2015 10:47 AM 3.01  (H) 0.61 - 1.24 mg/dL Final  02/11/2015 11:15 AM 2.72 (H) 0.61 - 1.24 mg/dL Final  01/14/2015 10:45 AM 2.72 (H) 0.61 - 1.24 mg/dL Final  12/17/2014 01:16 PM 2.63 (H) 0.61 - 1.24 mg/dL Final  11/19/2014 01:15 PM 2.80 (H) 0.61 - 1.24 mg/dL Final  10/23/2014 04:15 AM 2.43 (H) 0.61 - 1.24 mg/dL Final  10/22/2014 09:10 AM 2.20 (H) 0.61 - 1.24 mg/dL Final  10/21/2014 04:23 AM 2.40 (H) 0.61 - 1.24 mg/dL Final  10/20/2014 04:08 AM 2.86 (H) 0.61 - 1.24 mg/dL Final  10/20/2014 04:08 AM 2.89 (H) 0.61 - 1.24 mg/dL Final  10/19/2014 02:01 PM 3.49 (H) 0.61 - 1.24 mg/dL Final  09/24/2014 09:01 AM 2.97 (H) 0.61 - 1.24 mg/dL Final  08/27/2014 09:30 AM 2.74 (H) 0.61 - 1.24 mg/dL Final  07/30/2014 10:53 AM 2.85 (H) 0.50 - 1.35 mg/dL Final  07/07/2014 08:00 AM 2.65 (H) 0.50 - 1.35 mg/dL Final  07/06/2014 04:38 AM 2.74 (H) 0.50 - 1.35 mg/dL Final  07/05/2014 01:18 PM 3.20 (H) 0.50 - 1.35 mg/dL Final  07/02/2014 10:31 AM 3.76 (H) 0.50 - 1.35 mg/dL Final  06/04/2014 11:04 AM 2.50 (H) 0.50 - 1.35 mg/dL Final  05/06/2014 10:28 AM 2.23 (H) 0.50 - 1.35 mg/dL Final  03/26/2014 09:25 AM 2.72 (H) 0.50 - 1.35 mg/dL Final  02/26/2014 09:34 AM 2.78 (H) 0.50 - 1.35 mg/dL Final  01/15/2014 11:07 AM 2.29 (H) 0.50 - 1.35 mg/dL Final   Recent Labs  Lab 08/25/20 1706 08/26/20 0048  NA 136 135  K 5.6* 4.2  CL 115* 114*  CO2 10* 8*  GLUCOSE 129* 89  BUN 153* 157*  CREATININE 9.37* 9.48*  CALCIUM 7.4* 7.4*   Recent Labs  Lab 08/25/20 1706 08/26/20 0048  WBC 8.4 8.1  HGB 9.4* 9.0*  HCT 29.8* 26.9*  MCV 90.9 86.5  PLT 286 242   Liver Function Tests: No results for input(s): AST, ALT, ALKPHOS, BILITOT, PROT, ALBUMIN in the last 168 hours. No results for input(s): LIPASE, AMYLASE in the last 168 hours. No results for input(s): AMMONIA in the last 168 hours. Cardiac Enzymes: No results for input(s): CKTOTAL, CKMB, CKMBINDEX, TROPONINI in the last 168 hours. Iron Studies: No results for input(s): IRON, TIBC,  TRANSFERRIN, FERRITIN in the last 72 hours. PT/INR: @LABRCNTIP (inr:5)  Xrays/Other Studies: ) Results for orders placed or performed during the hospital encounter of 08/25/20 (from the past 48 hour(s))  CBC     Status: Abnormal   Collection Time: 08/25/20  5:06 PM  Result Value Ref Range   WBC 8.4 4.0 - 10.5 K/uL   RBC 3.28 (L) 4.22 - 5.81 MIL/uL   Hemoglobin 9.4 (L) 13.0 - 17.0 g/dL   HCT 29.8 (L) 39.0 - 52.0 %   MCV 90.9 80.0 - 100.0 fL   MCH 28.7  26.0 - 34.0 pg   MCHC 31.5 30.0 - 36.0 g/dL   RDW 14.6 11.5 - 15.5 %   Platelets 286 150 - 400 K/uL   nRBC 0.0 0.0 - 0.2 %    Comment: Performed at [facility], [place] [address] [place],  [address]  Basic metabolic panel     Status: Abnormal   Collection Time: 08/25/20  5:06 PM  Result Value Ref Range   Sodium 136 135 - 145 mmol/L   Potassium 5.6 (H) 3.5 - 5.1 mmol/L   Chloride 115 (H) 98 - 111 mmol/L   CO2 10 (L) 22 - 32 mmol/L   Glucose, Bld 129 (H) 70 - 99 mg/dL    Comment: Glucose reference range applies only to samples taken after fasting for at least 8 hours.   BUN 153 (H) 8 - 23 mg/dL   Creatinine, Ser 9.37 (H) 0.61 - 1.24 mg/dL   Calcium 7.4 (L) 8.9 - 10.3 mg/dL   GFR, Estimated 6 (L) >60 mL/min    Comment: (NOTE) Calculated using the CKD-EPI Creatinine Equation (2021)    Anion gap 11 5 - 15    Comment: Performed at [facility] [address] [place], [place] [address]  Troponin I (High Sensitivity)     Status: None   Collection Time: 08/25/20  5:06 PM  Result Value Ref Range   Troponin I (High Sensitivity) 13 <18 ng/L    Comment: (NOTE) Elevated high sensitivity troponin I (hsTnI) values and significant  changes across serial measurements may suggest ACS but many other  chronic and acute conditions are known to elevate hsTnI results.  Refer to the Links section for chest pain algorithms and additional  guidance. Performed at [facility], [place] [address], [place], [place] [address]    Troponin I (High Sensitivity)     Status: None   Collection Time: 08/25/20  7:25 PM  Result Value Ref Range   Troponin I (High Sensitivity) 12 <18 ng/L    Comment: (NOTE) Elevated high sensitivity troponin I (hsTnI) values and significant  changes across serial measurements may suggest ACS but many other  chronic and acute conditions are known to elevate hsTnI results.  Refer to the "Links" section for chest pain algorithms and additional  guidance. Performed at [facility], [place] [address] [place], [place] [address]   SARS CORONAVIRUS 2 (TAT 6-24 HRS) Nasopharyngeal Nasopharyngeal Swab     Status: None   Collection Time: 08/25/20  9:12 PM   Specimen: Nasopharyngeal Swab  Result Value Ref Range   SARS Coronavirus 2 NEGATIVE NEGATIVE    Comment: (NOTE) SARS-CoV-2 target nucleic acids are NOT DETECTED.  The SARS-CoV-2 RNA is generally detectable in upper and lower respiratory specimens during the acute phase of infection. Negative results do not preclude SARS-CoV-2 infection, do not rule out co-infections with other pathogens, and should not be used as the sole basis for treatment or other patient management decisions. Negative results must be combined with clinical observations, patient history, and epidemiological information. The expected result is Negative.  Fact Sheet for Patients: SugarRoll.be  Fact Sheet for Healthcare Providers: https://www.woods-mathews.com/  This test is not yet approved or cleared by the [place] FDA and  has been authorized for detection and/or diagnosis of SARS-CoV-2 by FDA under an Emergency Use Authorization (EUA). This EUA will remain  in effect (meaning this test can be used) for the duration of the COVID-19 declaration under Se ction 564(b)(1)  of the Act, 21 U.S.C. section 360bbb-3(b)(1), unless the authorization is terminated or revoked sooner.  Performed at [facility],  [place] [address], [place], [place] [address]   Glucose, capillary     Status: None   Collection Time: 08/25/20 11:44 PM  Result Value Ref Range   Glucose-Capillary 97 70 - 99 mg/dL    Comment: Glucose reference range applies only to samples taken after fasting for at least 8 hours.  HIV Antibody (routine testing w rflx)     Status: None   Collection Time: 08/26/20 12:48 AM  Result Value Ref Range   HIV Screen 4th Generation wRfx Non Reactive Non Reactive    Comment: Performed at [facility], [address], [place], [place] [address]  Basic metabolic panel     Status: Abnormal   Collection Time: 08/26/20 12:48 AM  Result Value Ref Range   Sodium 135 135 - 145 mmol/L   Potassium 4.2 3.5 - 5.1 mmol/L   Chloride 114 (H) 98 - 111 mmol/L   CO2 8 (L) 22 - 32 mmol/L   Glucose, Bld 89 70 - 99 mg/dL    Comment: Glucose reference range applies only to samples taken after fasting for at least 8 hours.   BUN 157 (H) 8 - 23 mg/dL   Creatinine, Ser 9.48 (H) 0.61 - 1.24 mg/dL   Calcium 7.4 (L) 8.9 - 10.3 mg/dL   GFR, Estimated 6 (L) >60 mL/min    Comment: (NOTE) Calculated using the CKD-EPI Creatinine Equation (2021)    Anion gap 13 5 - 15    Comment: Performed at [facility] [address] [place], [place] [address]  CBC     Status: Abnormal   Collection Time: 08/26/20 12:48 AM  Result Value Ref Range   WBC 8.1 4.0 - 10.5 K/uL   RBC 3.11 (L) 4.22 - 5.81 MIL/uL   Hemoglobin 9.0 (L) 13.0 - 17.0 g/dL   HCT 26.9 (L) 39.0 - 52.0 %   MCV 86.5 80.0 - 100.0 fL   MCH 28.9 26.0 - 34.0 pg   MCHC 33.5 30.0 - 36.0 g/dL   RDW 14.3 11.5 - 15.5 %   Platelets 242 150 - 400 K/uL   nRBC 0.0 0.0 - 0.2 %    Comment: Performed at [facility], [place] [address], [place], [place] [address]  Glucose, capillary     Status: None   Collection Time: 08/26/20  7:51 AM  Result Value Ref Range   Glucose-Capillary 83 70 - 99 mg/dL    Comment: Glucose reference range applies only to samples taken after  fasting for at least 8 hours.   DG Chest 2 View  Result Date: 08/25/2020 CLINICAL DATA:  Shortness of breath EXAM: CHEST - 2 VIEW COMPARISON:  05/06/2020 FINDINGS: The heart size and mediastinal contours are within normal limits. Both lungs are clear. The visualized skeletal structures are unremarkable. IMPRESSION: No acute abnormality of the lungs. Electronically Signed   By: Eddie Candle M.D.   On: 08/25/2020 17:43    PMH:   Past Medical History:  Diagnosis Date  . Allergy   . Anemia   . Anemia in chronic kidney disease (CKD) 08/12/2016  . Anxiety   . Bladder cancer ([place]) 2013  . Blood transfusion without reported diagnosis   . Coagulation defect, unspecified ([place]) 08/26/2016  . Complication of anesthesia    agitation w/awakening in 9/13,OK 01/24/12  . Depression   . Diabetes  mellitus type 2, diet-controlled ([place]) PT STOPPED TAKING METFORMIN--  HAS BEEN WATCHING DIET, EXERCISING AND LOSING WT  . ESRD (end stage renal disease) ([place]) 08/11/2016  . Frequency of urination   . GERD (gastroesophageal reflux disease)   . Gout    recent flair -bilateral feet--  STABLE  . Hyperkalemia 08/12/2016  . Hypertension   . Hypertensive kidney disease with CKD (chronic kidney disease)   . Nocturia   . Non-compliance with renal dialysis ([place])   . Secondary hyperparathyroidism of renal origin ([place]) 08/12/2016  . Urgency incontinence     PSH:   Past Surgical History:  Procedure Laterality Date  . AV FISTULA PLACEMENT Left 05/31/2014   Procedure: LEFT RADIOCEPHALIC ARTERIOVENOUS (AV) FISTULA CREATION ;  Surgeon: Mal Misty, MD;  Location: [facility];  Service: Vascular;  Laterality: Left;  . BACK SURGERY    . BLADDER SURGERY  04/26/2013   at the cancer treatment [facility] of [place] in  [place]    . COLONOSCOPY    . CYSTOSCOPY W/ RETROGRADES Bilateral 07/10/2012   Procedure: CYSTOSCOPY WITH RETROGRADE PYELOGRAM;  Surgeon: Molli Hazard, MD;  Location: [facility];  Service:  Urology;  Laterality: Bilateral;  . CYSTOSCOPY WITH URETHRAL DILATATION  01/24/2012   Procedure: CYSTOSCOPY WITH URETHRAL DILATATION;  Surgeon: Molli Hazard, MD;  Location: [facility];  Service: Urology;  Laterality: N/A;  . IR NEPHROSTOMY PLACEMENT LEFT  07/16/2016  . LUMBAR LAMINECTOMY  06-02-2005   W/ RESECTION NERVE ROOT  L4  -  L5  . NEPHROSTOMY TUBE PLACEMENT ([place] [facility])  2015   right   . TRANSURETHRAL RESECTION OF BLADDER TUMOR  12/15/2011   Procedure: TRANSURETHRAL RESECTION OF BLADDER TUMOR (TURBT);  Surgeon: Molli Hazard, MD;  Location: [facility];  Service: Urology;  Laterality: N/A;  2 HRS   . TRANSURETHRAL RESECTION OF BLADDER TUMOR  01/24/2012   Procedure: TRANSURETHRAL RESECTION OF BLADDER TUMOR (TURBT);  Surgeon: Molli Hazard, MD;  Location: [facility];  Service: Urology;  Laterality: N/A;  90 MIN   . TRANSURETHRAL RESECTION OF BLADDER TUMOR N/A 07/10/2012   Procedure: TRANSURETHRAL RESECTION OF BLADDER TUMOR (TURBT);  Surgeon: Molli Hazard, MD;  Location: [facility];  Service: Urology;  Laterality: N/A;  . ulner artery repair Right 2009   rt -trauma /thrombectomy,repair    Allergies:  Allergies  Allergen Reactions  . Lisinopril Cough    Medications:   Prior to Admission medications   Medication Sig Start Date End Date Taking? Authorizing Provider  amLODipine (NORVASC) 10 MG tablet Take 10 mg by mouth at bedtime. 03/22/19  Yes [provider]  AURYXIA 1 GM 210 MG(Fe) tablet Take 420 mg by mouth at bedtime. 04/14/18  Yes [provider]  cinacalcet (SENSIPAR) 30 MG tablet Take 30 mg by mouth at bedtime.   Yes [provider]  metoprolol tartrate (LOPRESSOR) 25 MG tablet Take 25 mg by mouth at bedtime.   Yes [provider]  pantoprazole (PROTONIX) 40 MG tablet Take 40 mg by mouth 2 (two) times daily. 05/11/14  Yes [provider]   sodium bicarbonate 650 MG tablet Take 1,300 mg by mouth at bedtime.   Yes [provider]  venlafaxine XR (EFFEXOR-XR) 150 MG 24 hr capsule Take 1 capsule (150 mg total) by mouth daily with breakfast. Patient taking differently: Take 150 mg by mouth at bedtime. 07/06/17  Yes Ursula Alert, MD  rosuvastatin (CRESTOR) 5  MG tablet Take 5 mg by mouth daily. Patient not taking: Reported on 08/25/2020 05/26/19   [provider]  sevelamer carbonate (RENVELA) 800 MG tablet Take 800 mg by mouth 3 (three) times daily. Patient not taking: Reported on 08/25/2020 08/11/16   [provider]  QUEtiapine (SEROQUEL) 25 MG tablet Take 1 tablet (25 mg total) by mouth at bedtime. Patient not taking: Reported on 05/04/2018 07/06/17 05/27/19  Ursula Alert, MD    Discontinued Meds:   Medications Discontinued During This Encounter  Medication Reason  . albuterol (VENTOLIN HFA) 108 (90 Base) MCG/ACT inhaler 2 puff     Social History:  reports that he quit smoking about 28 years ago. His smoking use included cigarettes. He has a 16.00 pack-year smoking history. He quit smokeless tobacco use about 28 years ago.  His smokeless tobacco use included chew. He reports that he does not drink alcohol and does not use drugs.  Family History:   Family History  Problem Relation Age of Onset  . Heart disease Mother   . Hypertension Other   . Diabetes Maternal Grandmother   . Brain cancer Paternal Uncle   . Bladder Cancer Neg Hx   . Colon cancer Neg Hx   . Esophageal cancer Neg Hx   . Rectal cancer Neg Hx   . Stomach cancer Neg Hx     Blood pressure (!) 173/78, pulse 94, temperature 98.2 F (36.8 C), temperature source Oral, resp. rate 16, SpO2 100 %. Gen: NAD, no accessory muscle use CVS: tachy Resp: cta b/l Abd: +BS, soft, NT/ND Ext: no edema Back: no TTP Access:  Lt [place]  +T/B      Dwana Melena, MD 08/26/2020, 7:53 AM

## 2020-08-27 ENCOUNTER — Telehealth: Payer: Self-pay | Admitting: Nephrology

## 2020-08-27 NOTE — Telephone Encounter (Signed)
Transition of Care Contact from [place]   Date of Discharge: 08/26/20 Date of Contact: 08/28/31 Method of contact: phone Talked to patient   Patient contacted to discuss transition of care form recent hospitaliztion. Patient was admitted to [facility] from 08/25/20 to 08/26/20 with the discharge diagnosis of uremia, SOB likely 2/2 missed dialysis.     Medication changes were reviewed.  Renvela stopped.  Checking labs at dialysis today.   Patient will follow up with is outpatient dialysis center today 08/27/20.  Other follow up needs include - out of cinacalcet - review outpatient labs to be collected today and send refill if appropriate.    Jen Mow, PA-C [facility] Pager: [phone]

## 2021-06-01 ENCOUNTER — Encounter: Payer: Self-pay | Admitting: Gastroenterology

## 2021-09-28 ENCOUNTER — Observation Stay (HOSPITAL_COMMUNITY)
Admission: EM | Admit: 2021-09-28 | Discharge: 2021-09-29 | Disposition: A | Discharge status: Home / Self Care | Payer: Medicare Other | Attending: Family Medicine | Admitting: Family Medicine

## 2021-09-28 ENCOUNTER — Other Ambulatory Visit: Payer: Self-pay

## 2021-09-28 ENCOUNTER — Emergency Department (HOSPITAL_COMMUNITY): Payer: Medicare Other

## 2021-09-28 DIAGNOSIS — J810 Acute pulmonary edema: Secondary | ICD-10-CM | POA: Insufficient documentation

## 2021-09-28 DIAGNOSIS — Z87891 Personal history of nicotine dependence: Secondary | ICD-10-CM | POA: Insufficient documentation

## 2021-09-28 DIAGNOSIS — Z79899 Other long term (current) drug therapy: Secondary | ICD-10-CM | POA: Insufficient documentation

## 2021-09-28 DIAGNOSIS — F332 Major depressive disorder, recurrent severe without psychotic features: Secondary | ICD-10-CM | POA: Diagnosis present

## 2021-09-28 DIAGNOSIS — I132 Hypertensive heart and chronic kidney disease with heart failure and with stage 5 chronic kidney disease, or end stage renal disease: Secondary | ICD-10-CM | POA: Insufficient documentation

## 2021-09-28 DIAGNOSIS — Z992 Dependence on renal dialysis: Secondary | ICD-10-CM | POA: Diagnosis not present

## 2021-09-28 DIAGNOSIS — Z20822 Contact with and (suspected) exposure to covid-19: Secondary | ICD-10-CM | POA: Diagnosis not present

## 2021-09-28 DIAGNOSIS — R0602 Shortness of breath: Secondary | ICD-10-CM | POA: Diagnosis present

## 2021-09-28 DIAGNOSIS — E1122 Type 2 diabetes mellitus with diabetic chronic kidney disease: Secondary | ICD-10-CM | POA: Diagnosis not present

## 2021-09-28 DIAGNOSIS — D638 Anemia in other chronic diseases classified elsewhere: Secondary | ICD-10-CM | POA: Diagnosis present

## 2021-09-28 DIAGNOSIS — E778 Other disorders of glycoprotein metabolism: Secondary | ICD-10-CM | POA: Insufficient documentation

## 2021-09-28 DIAGNOSIS — Z8551 Personal history of malignant neoplasm of bladder: Secondary | ICD-10-CM | POA: Insufficient documentation

## 2021-09-28 DIAGNOSIS — I509 Heart failure, unspecified: Secondary | ICD-10-CM | POA: Diagnosis not present

## 2021-09-28 DIAGNOSIS — R7989 Other specified abnormal findings of blood chemistry: Secondary | ICD-10-CM

## 2021-09-28 DIAGNOSIS — R9431 Abnormal electrocardiogram [ECG] [EKG]: Secondary | ICD-10-CM

## 2021-09-28 DIAGNOSIS — I5033 Acute on chronic diastolic (congestive) heart failure: Principal | ICD-10-CM | POA: Insufficient documentation

## 2021-09-28 DIAGNOSIS — I10 Essential (primary) hypertension: Secondary | ICD-10-CM | POA: Diagnosis present

## 2021-09-28 DIAGNOSIS — K219 Gastro-esophageal reflux disease without esophagitis: Secondary | ICD-10-CM | POA: Diagnosis present

## 2021-09-28 DIAGNOSIS — N186 End stage renal disease: Secondary | ICD-10-CM | POA: Diagnosis not present

## 2021-09-28 DIAGNOSIS — E872 Acidosis, unspecified: Secondary | ICD-10-CM | POA: Diagnosis present

## 2021-09-28 DIAGNOSIS — R778 Other specified abnormalities of plasma proteins: Secondary | ICD-10-CM

## 2021-09-28 DIAGNOSIS — E119 Type 2 diabetes mellitus without complications: Secondary | ICD-10-CM

## 2021-09-28 DIAGNOSIS — C679 Malignant neoplasm of bladder, unspecified: Secondary | ICD-10-CM | POA: Diagnosis present

## 2021-09-28 HISTORY — DX: Other ill-defined heart diseases: I51.89

## 2021-09-28 LAB — CBC WITH DIFFERENTIAL/PLATELET
Abs Immature Granulocytes: 0.04 10*3/uL (ref 0.00–0.07)
Basophils Absolute: 0 10*3/uL (ref 0.0–0.1)
Basophils Relative: 0 %
Eosinophils Absolute: 0.2 10*3/uL (ref 0.0–0.5)
Eosinophils Relative: 2 %
HCT: 37.6 % — ABNORMAL LOW (ref 39.0–52.0)
Hemoglobin: 12.3 g/dL — ABNORMAL LOW (ref 13.0–17.0)
Immature Granulocytes: 0 %
Lymphocytes Relative: 28 %
Lymphs Abs: 3 10*3/uL (ref 0.7–4.0)
MCH: 30.1 pg (ref 26.0–34.0)
MCHC: 32.7 g/dL (ref 30.0–36.0)
MCV: 91.9 fL (ref 80.0–100.0)
Monocytes Absolute: 0.6 10*3/uL (ref 0.1–1.0)
Monocytes Relative: 5 %
Neutro Abs: 7.1 10*3/uL (ref 1.7–7.7)
Neutrophils Relative %: 65 %
Platelets: 250 10*3/uL (ref 150–400)
RBC: 4.09 MIL/uL — ABNORMAL LOW (ref 4.22–5.81)
RDW: 16 % — ABNORMAL HIGH (ref 11.5–15.5)
WBC: 10.9 10*3/uL — ABNORMAL HIGH (ref 4.0–10.5)
nRBC: 0 % (ref 0.0–0.2)

## 2021-09-28 LAB — BRAIN NATRIURETIC PEPTIDE: B Natriuretic Peptide: 596.7 pg/mL — ABNORMAL HIGH (ref 0.0–100.0)

## 2021-09-28 LAB — HEPATITIS B SURFACE ANTIGEN: Hepatitis B Surface Ag: NONREACTIVE

## 2021-09-28 LAB — BASIC METABOLIC PANEL
Anion gap: 15 (ref 5–15)
BUN: 74 mg/dL — ABNORMAL HIGH (ref 8–23)
CO2: 17 mmol/L — ABNORMAL LOW (ref 22–32)
Calcium: 8.7 mg/dL — ABNORMAL LOW (ref 8.9–10.3)
Chloride: 101 mmol/L (ref 98–111)
Creatinine, Ser: 10.1 mg/dL — ABNORMAL HIGH (ref 0.61–1.24)
GFR, Estimated: 5 mL/min — ABNORMAL LOW (ref >60)
Glucose, Bld: 150 mg/dL — ABNORMAL HIGH (ref 70–99)
Potassium: 5.1 mmol/L (ref 3.5–5.1)
Sodium: 133 mmol/L — ABNORMAL LOW (ref 135–145)

## 2021-09-28 LAB — RESP PANEL BY RT-PCR (FLU A&B, COVID) ARPGX2
Influenza A by PCR: NEGATIVE
Influenza B by PCR: NEGATIVE
SARS Coronavirus 2 by RT PCR: NEGATIVE

## 2021-09-28 LAB — D-DIMER, QUANTITATIVE: D-Dimer, Quant: 1.24 ug/mL-FEU — ABNORMAL HIGH (ref 0.00–0.50)

## 2021-09-28 LAB — MAGNESIUM: Magnesium: 1.8 mg/dL (ref 1.7–2.4)

## 2021-09-28 LAB — TROPONIN I (HIGH SENSITIVITY)
Troponin I (High Sensitivity): 52 ng/L — ABNORMAL HIGH (ref <18)
Troponin I (High Sensitivity): 46 ng/L — ABNORMAL HIGH (ref <18)

## 2021-09-28 LAB — HEPATITIS C ANTIBODY: HCV Ab: NONREACTIVE

## 2021-09-28 LAB — HEPATITIS B SURFACE ANTIBODY,QUALITATIVE: Hep B S Ab: NONREACTIVE

## 2021-09-28 LAB — HEPATITIS B CORE ANTIBODY, TOTAL: Hep B Core Total Ab: NONREACTIVE

## 2021-09-28 LAB — HIV ANTIBODY (ROUTINE TESTING W REFLEX): HIV Screen 4th Generation wRfx: NONREACTIVE

## 2021-09-28 MED ORDER — SODIUM CHLORIDE 0.9% FLUSH: 3.0000 mL | INTRAVENOUS | Status: DC | PRN

## 2021-09-28 MED ORDER — SEVELAMER CARBONATE 800 MG PO TABS
800.0000 mg | ORAL_TABLET | ORAL | Status: DC
  Administered 2021-09-29: 800 mg via ORAL

## 2021-09-28 MED ORDER — IOHEXOL 350 MG/ML SOLN
66.0000 mL | Freq: Once | INTRAVENOUS | Status: AC | PRN
  Administered 2021-09-28: 66 mL via INTRAVENOUS

## 2021-09-28 MED ORDER — SEVELAMER CARBONATE 800 MG PO TABS: 800.0000 mg | ORAL_TABLET | ORAL | Status: DC

## 2021-09-28 MED ORDER — SODIUM CHLORIDE 0.9 % IV SOLN: 250.0000 mL | INTRAVENOUS | Status: DC | PRN

## 2021-09-28 MED ORDER — CALCIUM ACETATE (PHOS BINDER) 667 MG PO CAPS
1334.0000 mg | ORAL_CAPSULE | Freq: Every day | ORAL | Status: DC
  Administered 2021-09-29: 1334 mg via ORAL
  Filled 2021-09-28: qty 2

## 2021-09-28 MED ORDER — METOPROLOL TARTRATE 25 MG PO TABS
25.0000 mg | ORAL_TABLET | Freq: Every day | ORAL | Status: DC
  Administered 2021-09-29: 25 mg via ORAL
  Filled 2021-09-28: qty 1

## 2021-09-28 MED ORDER — CHLORHEXIDINE GLUCONATE CLOTH 2 % EX PADS
6.0000 | MEDICATED_PAD | Freq: Every day | CUTANEOUS | Status: DC
Start: 2021-09-29 — End: 2021-09-29

## 2021-09-28 MED ORDER — HEPARIN SODIUM (PORCINE) 5000 UNIT/ML IJ SOLN
5000.0000 [IU] | Freq: Three times a day (TID) | INTRAMUSCULAR | Status: DC
  Administered 2021-09-29 (×2): 5000 [IU] via SUBCUTANEOUS
  Filled 2021-09-28 (×2): qty 1

## 2021-09-28 MED ORDER — SEVELAMER CARBONATE 800 MG PO TABS
1600.0000 mg | ORAL_TABLET | Freq: Three times a day (TID) | ORAL | Status: DC
  Administered 2021-09-29 (×2): 1600 mg via ORAL
  Filled 2021-09-28 (×3): qty 2

## 2021-09-28 MED ORDER — ACETAMINOPHEN 325 MG PO TABS
650.0000 mg | ORAL_TABLET | Freq: Four times a day (QID) | ORAL | Status: DC | PRN
  Administered 2021-09-28: 650 mg via ORAL
  Filled 2021-09-28: qty 2

## 2021-09-28 MED ORDER — ACETAMINOPHEN 650 MG RE SUPP: 650.0000 mg | Freq: Four times a day (QID) | RECTAL | Status: DC | PRN

## 2021-09-28 MED ORDER — HYDRALAZINE HCL 20 MG/ML IJ SOLN: 10.0000 mg | Freq: Three times a day (TID) | INTRAMUSCULAR | Status: DC | PRN

## 2021-09-28 MED ORDER — PANTOPRAZOLE SODIUM 40 MG PO TBEC
80.0000 mg | DELAYED_RELEASE_TABLET | Freq: Every day | ORAL | Status: DC
  Administered 2021-09-29: 80 mg via ORAL
  Filled 2021-09-28: qty 2

## 2021-09-28 MED ORDER — SODIUM CHLORIDE 0.9% FLUSH
3.0000 mL | Freq: Two times a day (BID) | INTRAVENOUS | Status: DC
Start: 2021-09-28 — End: 2021-09-29
  Administered 2021-09-29 (×2): 3 mL via INTRAVENOUS

## 2021-09-28 MED ORDER — AMLODIPINE BESYLATE 10 MG PO TABS
10.0000 mg | ORAL_TABLET | Freq: Every day | ORAL | Status: DC
  Administered 2021-09-29: 10 mg via ORAL
  Filled 2021-09-28: qty 1

## 2021-09-28 MED ORDER — HEPARIN SODIUM (PORCINE) 1000 UNIT/ML DIALYSIS
3750.0000 [IU] | INTRAMUSCULAR | Status: AC | PRN
  Administered 2021-09-28 (×2): 3800 [IU] via INTRAVENOUS_CENTRAL
  Filled 2021-09-28 (×4): qty 4

## 2021-09-28 NOTE — Assessment & Plan Note (Signed)
Appears new Check magnesium Optimize electrolytes Keep on telemetry Avoid qt prolonging drugs Repeat ekg in AM

## 2021-09-28 NOTE — Assessment & Plan Note (Signed)
Stable continue to monitor Hgb> 12

## 2021-09-28 NOTE — Progress Notes (Addendum)
Asked to see patient dialysis. Here w/ SOB x about 5-6 days. He is supposed to get HD 3 days per week but he only goes 1 day per week. On HD x 5 yrs. His wife says he was "too sick" going 3 days per week and does a lot better now going just 1 day per week.  Pt states they usually pull 4.6 L on him but now he only tolerates about 3.5 L UF. Last week he almost passed out on HD.  CXR showed new IS edema c/w CHF. Exam slight basilar rales, O2 [place], no distress, no sig edema.   As it stands now, pt will not be admitted and will dc'd back to ED after HD for re-assessment prior to dc home.   MWF NW 4h 500/500  87kg  2K/3.5Ca bath  Hep 5000+  AVF - venofer 100 tiw thru 7/12 - rocaltrol 1.5 ug tiw po - last Hb 11.1 on 6/16, tsat 25%  Vinson Moselle, MD 09/28/2021, 12:40 PM

## 2021-09-28 NOTE — ED Notes (Signed)
Patient transported to X-ray 

## 2021-09-28 NOTE — Progress Notes (Signed)
Pt receives out-pt HD at [facility] on MWF schedule. Clinic confirms that pt normally comes on Wednesdays for treatment. Pt has an 11:40 arrival time for 12:00 chair time. Will assist as needed.   [employer] Navigator [phone]

## 2021-09-28 NOTE — ED Notes (Signed)
Patient placed on nasal cannula per request of PA. Pt c/o SOB.

## 2021-09-28 NOTE — ED Triage Notes (Signed)
PT came to ed for sob. Pt went to UC and they thought pt needs to be sent to ed for evaluation for blood clots. VSS. Axox4.

## 2021-09-29 ENCOUNTER — Observation Stay (HOSPITAL_BASED_OUTPATIENT_CLINIC_OR_DEPARTMENT_OTHER): Payer: Medicare Other

## 2021-09-29 ENCOUNTER — Observation Stay (HOSPITAL_COMMUNITY): Payer: Medicare Other

## 2021-09-29 ENCOUNTER — Encounter (HOSPITAL_COMMUNITY): Payer: Self-pay | Admitting: Family Medicine

## 2021-09-29 DIAGNOSIS — R9431 Abnormal electrocardiogram [ECG] [EKG]: Secondary | ICD-10-CM | POA: Diagnosis not present

## 2021-09-29 DIAGNOSIS — I5033 Acute on chronic diastolic (congestive) heart failure: Secondary | ICD-10-CM | POA: Diagnosis not present

## 2021-09-29 DIAGNOSIS — I5021 Acute systolic (congestive) heart failure: Secondary | ICD-10-CM

## 2021-09-29 DIAGNOSIS — N186 End stage renal disease: Secondary | ICD-10-CM | POA: Diagnosis not present

## 2021-09-29 DIAGNOSIS — E778 Other disorders of glycoprotein metabolism: Secondary | ICD-10-CM | POA: Diagnosis not present

## 2021-09-29 DIAGNOSIS — E119 Type 2 diabetes mellitus without complications: Secondary | ICD-10-CM

## 2021-09-29 DIAGNOSIS — E1122 Type 2 diabetes mellitus with diabetic chronic kidney disease: Secondary | ICD-10-CM | POA: Diagnosis not present

## 2021-09-29 DIAGNOSIS — Z20822 Contact with and (suspected) exposure to covid-19: Secondary | ICD-10-CM | POA: Diagnosis not present

## 2021-09-29 LAB — BASIC METABOLIC PANEL
Anion gap: 14 (ref 5–15)
BUN: 32 mg/dL — ABNORMAL HIGH (ref 8–23)
CO2: 25 mmol/L (ref 22–32)
Calcium: 8.6 mg/dL — ABNORMAL LOW (ref 8.9–10.3)
Chloride: 95 mmol/L — ABNORMAL LOW (ref 98–111)
Creatinine, Ser: 6.03 mg/dL — ABNORMAL HIGH (ref 0.61–1.24)
GFR, Estimated: 10 mL/min — ABNORMAL LOW (ref >60)
Glucose, Bld: 160 mg/dL — ABNORMAL HIGH (ref 70–99)
Potassium: 4.1 mmol/L (ref 3.5–5.1)
Sodium: 134 mmol/L — ABNORMAL LOW (ref 135–145)

## 2021-09-29 LAB — GLUCOSE, CAPILLARY
Glucose-Capillary: 135 mg/dL — ABNORMAL HIGH (ref 70–99)
Glucose-Capillary: 106 mg/dL — ABNORMAL HIGH (ref 70–99)

## 2021-09-29 LAB — CBC
HCT: 34.3 % — ABNORMAL LOW (ref 39.0–52.0)
Hemoglobin: 11.7 g/dL — ABNORMAL LOW (ref 13.0–17.0)
MCH: 30.4 pg (ref 26.0–34.0)
MCHC: 34.1 g/dL (ref 30.0–36.0)
MCV: 89.1 fL (ref 80.0–100.0)
Platelets: 218 10*3/uL (ref 150–400)
RBC: 3.85 MIL/uL — ABNORMAL LOW (ref 4.22–5.81)
RDW: 15.9 % — ABNORMAL HIGH (ref 11.5–15.5)
WBC: 8.6 10*3/uL (ref 4.0–10.5)
nRBC: 0 % (ref 0.0–0.2)

## 2021-09-29 LAB — ECHOCARDIOGRAM COMPLETE
Area-P 1/2: 3.99 cm2
Height: 70 in
P 1/2 time: 292 msec
S' Lateral: 4.1 cm
Single Plane A4C EF: 45 %
Weight: 3139.35 oz

## 2021-09-29 LAB — HEPATITIS B SURFACE ANTIBODY, QUANTITATIVE: Hep B S AB Quant (Post): <3.1 m[IU]/mL — ABNORMAL LOW (ref >9.9)

## 2021-09-29 LAB — MRSA NEXT GEN BY PCR, NASAL: MRSA by PCR Next Gen: NOT DETECTED

## 2021-09-29 MED ORDER — INSULIN ASPART 100 UNIT/ML IJ SOLN: 0.0000 [IU] | Freq: Three times a day (TID) | INTRAMUSCULAR | Status: DC

## 2021-09-29 MED ORDER — INSULIN ASPART 100 UNIT/ML IJ SOLN: 0.0000 [IU] | Freq: Every day | INTRAMUSCULAR | Status: DC

## 2021-09-29 MED ORDER — CHLORHEXIDINE GLUCONATE CLOTH 2 % EX PADS
6.0000 | MEDICATED_PAD | Freq: Every day | CUTANEOUS | Status: DC
  Administered 2021-09-29: 6 via TOPICAL

## 2021-09-29 MED ORDER — ORAL CARE MOUTH RINSE: 15.0000 mL | OROMUCOSAL | Status: DC | PRN

## 2021-09-30 LAB — HEMOGLOBIN A1C
Hgb A1c MFr Bld: 5.6 % (ref 4.8–5.6)
Mean Plasma Glucose: 114 mg/dL

## 2021-12-23 ENCOUNTER — Ambulatory Visit: Payer: Self-pay | Admitting: Licensed Clinical Social Worker

## 2021-12-23 NOTE — Patient Outreach (Signed)
  Care Coordination   12/23/2021 Name: [name] [name] MRN: [id number] DOB: May 16, 1960   Care Coordination Outreach Attempts:  An unsuccessful telephone outreach was attempted today to offer the patient information about available care coordination services as a benefit of their health plan.   Follow Up Plan:  Additional outreach attempts will be made to offer the patient care coordination information and services.   Encounter Outcome:  No Answer  Care Coordination Interventions Activated:  No   Care Coordination Interventions:  No, not indicated    Lenor Derrick, MSW  Social Worker [facility]  [phone]

## 2022-01-07 ENCOUNTER — Telehealth: Payer: Self-pay

## 2022-01-07 NOTE — Patient Outreach (Signed)
  Care Coordination   01/07/2022 Name: [name] [name] MRN: [id number] DOB: 11-29-1960   Care Coordination Outreach Attempts:  A second unsuccessful outreach was attempted today to offer the patient with information about available care coordination services as a benefit of their health plan.     Follow Up Plan:  Additional outreach attempts will be made to offer the patient care coordination information and services.   Encounter Outcome:  No Answer  Care Coordination Interventions Activated:  No   Care Coordination Interventions:  No, not indicated    Johnney Killian, RN, BSN, CCM Care Management Coordinator [facility] Phone: [phone]: [phone]

## 2022-04-23 ENCOUNTER — Emergency Department (HOSPITAL_COMMUNITY): Payer: Medicare Other

## 2022-04-23 ENCOUNTER — Other Ambulatory Visit: Payer: Self-pay

## 2022-04-23 ENCOUNTER — Inpatient Hospital Stay (HOSPITAL_COMMUNITY)
Admission: EM | Admit: 2022-04-23 | Discharge: 2022-04-25 | DRG: 069 | Disposition: A | Discharge status: Home / Self Care | Payer: Medicare Other | Source: Ambulatory Visit | Attending: Internal Medicine | Admitting: Internal Medicine

## 2022-04-23 DIAGNOSIS — N2581 Secondary hyperparathyroidism of renal origin: Secondary | ICD-10-CM | POA: Diagnosis present

## 2022-04-23 DIAGNOSIS — F419 Anxiety disorder, unspecified: Secondary | ICD-10-CM | POA: Insufficient documentation

## 2022-04-23 DIAGNOSIS — Z905 Acquired absence of kidney: Secondary | ICD-10-CM

## 2022-04-23 DIAGNOSIS — Z888 Allergy status to other drugs, medicaments and biological substances status: Secondary | ICD-10-CM

## 2022-04-23 DIAGNOSIS — K5900 Constipation, unspecified: Secondary | ICD-10-CM | POA: Diagnosis present

## 2022-04-23 DIAGNOSIS — Z79899 Other long term (current) drug therapy: Secondary | ICD-10-CM

## 2022-04-23 DIAGNOSIS — N2883 Nephroptosis: Secondary | ICD-10-CM | POA: Diagnosis present

## 2022-04-23 DIAGNOSIS — R112 Nausea with vomiting, unspecified: Secondary | ICD-10-CM | POA: Diagnosis present

## 2022-04-23 DIAGNOSIS — R451 Restlessness and agitation: Secondary | ICD-10-CM | POA: Diagnosis not present

## 2022-04-23 DIAGNOSIS — E785 Hyperlipidemia, unspecified: Secondary | ICD-10-CM | POA: Diagnosis present

## 2022-04-23 DIAGNOSIS — Z808 Family history of malignant neoplasm of other organs or systems: Secondary | ICD-10-CM | POA: Diagnosis not present

## 2022-04-23 DIAGNOSIS — Z87891 Personal history of nicotine dependence: Secondary | ICD-10-CM

## 2022-04-23 DIAGNOSIS — G459 Transient cerebral ischemic attack, unspecified: Principal | ICD-10-CM | POA: Diagnosis present

## 2022-04-23 DIAGNOSIS — F32A Depression, unspecified: Secondary | ICD-10-CM | POA: Diagnosis present

## 2022-04-23 DIAGNOSIS — I132 Hypertensive heart and chronic kidney disease with heart failure and with stage 5 chronic kidney disease, or end stage renal disease: Secondary | ICD-10-CM | POA: Diagnosis present

## 2022-04-23 DIAGNOSIS — E875 Hyperkalemia: Secondary | ICD-10-CM | POA: Diagnosis present

## 2022-04-23 DIAGNOSIS — C679 Malignant neoplasm of bladder, unspecified: Secondary | ICD-10-CM | POA: Insufficient documentation

## 2022-04-23 DIAGNOSIS — Z992 Dependence on renal dialysis: Secondary | ICD-10-CM | POA: Diagnosis not present

## 2022-04-23 DIAGNOSIS — E1122 Type 2 diabetes mellitus with diabetic chronic kidney disease: Secondary | ICD-10-CM | POA: Diagnosis present

## 2022-04-23 DIAGNOSIS — I959 Hypotension, unspecified: Secondary | ICD-10-CM | POA: Diagnosis present

## 2022-04-23 DIAGNOSIS — N186 End stage renal disease: Secondary | ICD-10-CM | POA: Insufficient documentation

## 2022-04-23 DIAGNOSIS — R4701 Aphasia: Secondary | ICD-10-CM | POA: Diagnosis present

## 2022-04-23 DIAGNOSIS — M109 Gout, unspecified: Secondary | ICD-10-CM | POA: Insufficient documentation

## 2022-04-23 DIAGNOSIS — I5022 Chronic systolic (congestive) heart failure: Secondary | ICD-10-CM | POA: Diagnosis present

## 2022-04-23 DIAGNOSIS — Z7982 Long term (current) use of aspirin: Secondary | ICD-10-CM

## 2022-04-23 DIAGNOSIS — Z8249 Family history of ischemic heart disease and other diseases of the circulatory system: Secondary | ICD-10-CM

## 2022-04-23 DIAGNOSIS — G9340 Encephalopathy, unspecified: Principal | ICD-10-CM

## 2022-04-23 DIAGNOSIS — Z1152 Encounter for screening for COVID-19: Secondary | ICD-10-CM

## 2022-04-23 DIAGNOSIS — Z833 Family history of diabetes mellitus: Secondary | ICD-10-CM

## 2022-04-23 DIAGNOSIS — K219 Gastro-esophageal reflux disease without esophagitis: Secondary | ICD-10-CM | POA: Diagnosis present

## 2022-04-23 DIAGNOSIS — G9341 Metabolic encephalopathy: Secondary | ICD-10-CM

## 2022-04-23 DIAGNOSIS — R410 Disorientation, unspecified: Secondary | ICD-10-CM | POA: Diagnosis present

## 2022-04-23 DIAGNOSIS — D631 Anemia in chronic kidney disease: Secondary | ICD-10-CM | POA: Diagnosis present

## 2022-04-23 DIAGNOSIS — I10 Essential (primary) hypertension: Secondary | ICD-10-CM | POA: Insufficient documentation

## 2022-04-23 DIAGNOSIS — E119 Type 2 diabetes mellitus without complications: Secondary | ICD-10-CM | POA: Insufficient documentation

## 2022-04-23 DIAGNOSIS — R4182 Altered mental status, unspecified: Secondary | ICD-10-CM | POA: Diagnosis present

## 2022-04-23 DIAGNOSIS — Z8551 Personal history of malignant neoplasm of bladder: Secondary | ICD-10-CM

## 2022-04-23 LAB — CBC
HCT: 32.7 % — ABNORMAL LOW (ref 39.0–52.0)
Hemoglobin: 11.6 g/dL — ABNORMAL LOW (ref 13.0–17.0)
MCH: 30.9 pg (ref 26.0–34.0)
MCHC: 35.5 g/dL (ref 30.0–36.0)
MCV: 87 fL (ref 80.0–100.0)
Platelets: 217 10*3/uL (ref 150–400)
RBC: 3.76 MIL/uL — ABNORMAL LOW (ref 4.22–5.81)
RDW: 14.1 % (ref 11.5–15.5)
WBC: 7.8 10*3/uL (ref 4.0–10.5)
nRBC: 0 % (ref 0.0–0.2)

## 2022-04-23 LAB — COMPREHENSIVE METABOLIC PANEL
ALT: 23 U/L (ref 0–44)
AST: 27 U/L (ref 15–41)
Albumin: 3.9 g/dL (ref 3.5–5.0)
Alkaline Phosphatase: 63 U/L (ref 38–126)
Anion gap: 16 — ABNORMAL HIGH (ref 5–15)
BUN: 24 mg/dL — ABNORMAL HIGH (ref 8–23)
CO2: 21 mmol/L — ABNORMAL LOW (ref 22–32)
Calcium: 9.7 mg/dL (ref 8.9–10.3)
Chloride: 94 mmol/L — ABNORMAL LOW (ref 98–111)
Creatinine, Ser: 4.97 mg/dL — ABNORMAL HIGH (ref 0.61–1.24)
GFR, Estimated: 12 mL/min — ABNORMAL LOW (ref >60)
Glucose, Bld: 202 mg/dL — ABNORMAL HIGH (ref 70–99)
Potassium: 4.1 mmol/L (ref 3.5–5.1)
Sodium: 131 mmol/L — ABNORMAL LOW (ref 135–145)
Total Bilirubin: 0.5 mg/dL (ref 0.3–1.2)
Total Protein: 7.3 g/dL (ref 6.5–8.1)

## 2022-04-23 LAB — RAPID URINE DRUG SCREEN, HOSP PERFORMED
Amphetamines: NOT DETECTED
Barbiturates: NOT DETECTED
Benzodiazepines: NOT DETECTED
Cocaine: NOT DETECTED
Opiates: NOT DETECTED
Tetrahydrocannabinol: NOT DETECTED

## 2022-04-23 LAB — DIFFERENTIAL
Abs Immature Granulocytes: 0.1 10*3/uL — ABNORMAL HIGH (ref 0.00–0.07)
Basophils Absolute: 0 10*3/uL (ref 0.0–0.1)
Basophils Relative: 0 %
Eosinophils Absolute: 0 10*3/uL (ref 0.0–0.5)
Eosinophils Relative: 0 %
Immature Granulocytes: 1 %
Lymphocytes Relative: 9 %
Lymphs Abs: 0.7 10*3/uL (ref 0.7–4.0)
Monocytes Absolute: 0.2 10*3/uL (ref 0.1–1.0)
Monocytes Relative: 3 %
Neutro Abs: 6.8 10*3/uL (ref 1.7–7.7)
Neutrophils Relative %: 87 %

## 2022-04-23 LAB — URINALYSIS, ROUTINE W REFLEX MICROSCOPIC
Bilirubin Urine: NEGATIVE
Glucose, UA: NEGATIVE mg/dL
Ketones, ur: NEGATIVE mg/dL
Nitrite: NEGATIVE
Protein, ur: 100 mg/dL — AB
Specific Gravity, Urine: 1.009 (ref 1.005–1.030)
pH: 7 (ref 5.0–8.0)

## 2022-04-23 LAB — ETHANOL: Alcohol, Ethyl (B): <10 mg/dL (ref <10)

## 2022-04-23 LAB — RESP PANEL BY RT-PCR (RSV, FLU A&B, COVID)  RVPGX2
Influenza A by PCR: NEGATIVE
Influenza B by PCR: NEGATIVE
Resp Syncytial Virus by PCR: NEGATIVE
SARS Coronavirus 2 by RT PCR: NEGATIVE

## 2022-04-23 LAB — CBG MONITORING, ED: Glucose-Capillary: 179 mg/dL — ABNORMAL HIGH (ref 70–99)

## 2022-04-23 LAB — PROTIME-INR
INR: 0.9 (ref 0.8–1.2)
Prothrombin Time: 12.4 seconds (ref 11.4–15.2)

## 2022-04-23 LAB — APTT: aPTT: 28 seconds (ref 24–36)

## 2022-04-23 MED ORDER — ASPIRIN 300 MG RE SUPP
300.0000 mg | Freq: Every day | RECTAL | Status: DC
  Administered 2022-04-23: 300 mg via RECTAL
  Filled 2022-04-23: qty 1

## 2022-04-23 MED ORDER — ASPIRIN 81 MG PO CHEW
81.0000 mg | CHEWABLE_TABLET | Freq: Every day | ORAL | Status: DC
  Administered 2022-04-24 – 2022-04-25 (×2): 81 mg via ORAL
  Filled 2022-04-23 (×2): qty 1

## 2022-04-23 MED ORDER — LORAZEPAM 2 MG/ML IJ SOLN
INTRAMUSCULAR | Status: AC
  Administered 2022-04-23: 2 mg
  Filled 2022-04-23: qty 1

## 2022-04-23 MED ORDER — LORAZEPAM 2 MG/ML IJ SOLN
1.0000 mg | Freq: Once | INTRAMUSCULAR | Status: AC
  Administered 2022-04-24: 1 mg via INTRAVENOUS
  Filled 2022-04-23: qty 1

## 2022-04-23 MED ORDER — KETAMINE HCL 50 MG/ML IJ SOLN
3.0000 mg/kg | Freq: Once | INTRAMUSCULAR | Status: AC
  Administered 2022-04-23: 265 mg via INTRAMUSCULAR
  Filled 2022-04-23: qty 10

## 2022-04-23 MED ORDER — SENNOSIDES-DOCUSATE SODIUM 8.6-50 MG PO TABS: 1.0000 | ORAL_TABLET | Freq: Every evening | ORAL | Status: DC | PRN

## 2022-04-23 MED ORDER — HEPARIN SODIUM (PORCINE) 5000 UNIT/ML IJ SOLN
5000.0000 [IU] | Freq: Three times a day (TID) | INTRAMUSCULAR | Status: DC
  Administered 2022-04-24 – 2022-04-25 (×5): 5000 [IU] via SUBCUTANEOUS
  Filled 2022-04-23 (×5): qty 1

## 2022-04-23 MED ORDER — ACETAMINOPHEN 160 MG/5ML PO SOLN: 650.0000 mg | ORAL | Status: DC | PRN

## 2022-04-23 MED ORDER — ACETAMINOPHEN 650 MG RE SUPP: 650.0000 mg | RECTAL | Status: DC | PRN

## 2022-04-23 MED ORDER — STROKE: EARLY STAGES OF RECOVERY BOOK
Freq: Once | Status: AC
  Administered 2022-04-24: 1
  Filled 2022-04-23: qty 1

## 2022-04-23 MED ORDER — ACETAMINOPHEN 325 MG PO TABS: 650.0000 mg | ORAL_TABLET | ORAL | Status: DC | PRN

## 2022-04-23 MED ORDER — IOHEXOL 350 MG/ML SOLN
100.0000 mL | Freq: Once | INTRAVENOUS | Status: AC | PRN
  Administered 2022-04-23: 100 mL via INTRAVENOUS

## 2022-04-23 NOTE — Consult Note (Addendum)
NEUROLOGY CONSULTATION NOTE   Date of service: April 23, 2022 Patient Name: [name] [name] MRN:  [id number] DOB:  [birth year] Reason for consult: aphasia, AMS Requesting physician: Dr. Davonna Belling _ _ _   _ __   _ __ _ _  __ __   _ __   __ _  History of Present Illness   This is a 62 year old gentleman with a past medical history significant for ESRD on HD, CHF, diabetes, hypertension, history of bladder cancer status post-resection who is brought in by EMS from his dialysis center for altered mental status.  I attempted to call the dialysis center and obtain collateral information but they were closed.  At some point during his dialysis he became confused and unable to follow commands or speak.  I am unable to confirm that he was normal when he started dialysis.  I spoke to his wife on the phone and she stated that the last time she saw him normal was 10:30 AM this morning.  Therefore that was taken as the last known well.  On my examination he was extremely agitated, moaning unintelligibly, not able to follow commands, moving all extremities well.  NIHSS = 9. He required 8 mg of IV Ativan and was still too agitated to undergo head CT.  He then lost IV access and was extremely agitated to the point where security had to be called.  He was then sedated with IM ketamine.  Head CT without contrast showed no acute intracranial process on personal review.  Due to concern for possible L MCA branch occlusion causing aphasia CTA was performed, with CTP because last known well was >6 hours ago. CTA showed no evidence of LVO and CT perfusion showed only a 5 ml deficit of unclear significance.  All CNS imaging was personally reviewed.  TNK was not administered due to presentation outside the window.  Intervention was not indicated due to no LVO on imaging.   ROS   UTA 2/2 mental status  Past History   See HPI  Medications   (Not in a hospital admission)     Current Facility-Administered  Medications:    aspirin chewable tablet 81 mg, 81 mg, Oral, Daily **OR** aspirin suppository 300 mg, 300 mg, Rectal, Daily, Derek Jack, MD No current outpatient medications on file.  Vitals   Vitals:   04/23/22 1829 04/23/22 1930 04/23/22 1945  BP: (!) 132/91 (!) 182/78 (!) 172/77  Pulse: 90 98 96  Resp: '18 14 16  '$ SpO2: 100% 98% 99%  Weight: 89 kg       There is no height or weight on file to calculate BMI.  Physical Exam   Physical Exam Gen: alert, agitated, not following commands Resp: CTAB CV: extremities appear well-perfused  Neuro: *MS: alert, agitated, not following commands *Speech: moaning loudly, no intelligible speech *CN: PERRL, blinks to threat bilat, tracks examiner with apparent EOMI, face symmetric at rest *Motor: 5/5 throughout no drift in all extremities *Sensory: SILT *Reflexes: 1+ throughout, toes w/d only *Coordination: intact when resisting exam *Gait: UTA  NIHSS  1a Level of Conscious.: 0 1b LOC Questions: 2 1c LOC Commands: 2 2 Best Gaze: 0 3 Visual: 0 4 Facial Palsy: 0 5a Motor Arm - left: 0 5b Motor Arm - Right: 0 6a Motor Leg - Left: 0 6b Motor Leg - Right: 0 7 Limb Ataxia: 0 8 Sensory: 0 9 Best Language: 3 10 Dysarthria: 2 11 Extinct. and Inatten.: 0  TOTAL: 9  Premorbid mRS = 0   Labs   CBC: No results for input(s): "WBC", "NEUTROABS", "HGB", "HCT", "MCV", "PLT" in the last 168 hours.  Basic Metabolic Panel: No results found for: "NA", "K", "CO2", "GLUCOSE", "BUN", "CREATININE", "CALCIUM", "GFRNONAA", "GFRAA" Lipid Panel: No results found for: "LDLCALC" HgbA1c: No results found for: "HGBA1C" Urine Drug Screen: No results found for: "LABOPIA", "COCAINSCRNUR", "LABBENZ", "AMPHETMU", "THCU", "LABBARB"  Alcohol Level No results found for: "ETH"   Impression   This is a 62 year old gentleman with a past medical history significant for ESRD on HD, CHF, diabetes, hypertension, history of bladder cancer status  post-resection who is brought in by EMS from his dialysis center for altered mental status.  Stroke code was called for possible aphasia because he had unintelligible speech and was not following commands.  TNK was not administered due to presentation outside the window.  CTA showed no large vessel occlusion and CTP only showed 1m deficit of unclear significance.  Given his nonfocal exam and clear alteration in mentation I suspect that his symptoms are attributable to encephalopathy rather than a true aphasia.    Recommendations   - Encephalopathy workup per ED - If no etiology for his sx is identified, recommend MRI brain to r/o small acute infarct. If this is negative, no further stroke workup is indicated. - Start ASA PR '300mg'$  or PO '81mg'$  daily - Consult for HD, patient received iodinated contrast  Neurology will f/u on results of ED workup and results of MRI if performed  >90 minutes was spent in care of this patient  ______________________________________________________________________   Thank you for the opportunity to take part in the care of this patient. If you have any further questions, please contact the neurology consultation attending.  Signed,  CSu Monks MD [facility] [phone] If 7pm- 7am, please page neurology on call as listed in [place]  **Any copied and pasted documentation in this note was written by me in another application not billed for and pasted by me into this document.

## 2022-04-23 NOTE — ED Notes (Signed)
Unable to perform GCS and NIH on pt. Pt still medicated and only responds to pain. VSS

## 2022-04-23 NOTE — ED Notes (Addendum)
Attempted to do the NIH and GCS on the pt. Unable to complete due to medications given while in CT

## 2022-04-23 NOTE — ED Notes (Signed)
Pt will make noise when calling his name, and will become agitated when trying to move him in the bed. Pts speech is still not clear and unable to understand what he is saying. Pt will not follow commands. Pts family at the bedside.

## 2022-04-23 NOTE — H&P (Addendum)
PCP: Nickola Major, MD    Patient coming from: Home   Chief Complaint:  Altered mentation.  HPI: This is a 62 year old male with past medical history of bladder cancer, bladder replaced [patient makes urine], patient with recurrent and kidney prolapse status post nephrectomy, currently ESRD, penile implant, hypertension, secondary hyperparathyroidism.  Per family patient was okay when he went to hemodialysis today.  During hemodialysis his blood pressure became elevated, he had nausea vomiting and became aphasic.  He was transferred to [facility] ED.  He was transferred over as a code stroke.  In the ER patient quite aggressive, given 8 mg IV Ativan.  He remained combative.  He was given IV ketamine, an order for imaging to be done.  He is provided by patient's wife as well as ER physician.  Patient sleeping.  Review of Systems:  Positives: Altered mentation, nausea, vomiting, aphasia, aggressive behavior.       Past Medical History:  Diagnosis Date   Allergy     Anemia     Anemia in chronic kidney disease (CKD) 08/12/2016   Anxiety     Bladder cancer ([place]) 2013   Blood transfusion without reported diagnosis     Coagulation defect, unspecified ([place]) 68/02/5725   Complication of anesthesia      agitation w/awakening in 9/13,OK 01/24/12   Depression     Diabetes mellitus type 2, diet-controlled ([place]) PT STOPPED TAKING METFORMIN--  HAS BEEN WATCHING DIET, EXERCISING AND LOSING WT   ESRD (end stage renal disease) ([place]) 08/11/2016   Frequency of urination     GERD (gastroesophageal reflux disease)     Gout      recent flair -bilateral feet--  STABLE   Hyperkalemia 08/12/2016   Hypertension     Hypertensive kidney disease with CKD (chronic kidney disease)     Nocturia     Non-compliance with renal dialysis ([place])     Secondary hyperparathyroidism of renal origin ([place]) 08/12/2016   Urgency incontinence             Past Surgical History:  Procedure Laterality Date   AV  FISTULA PLACEMENT Left 05/31/2014    Procedure: LEFT RADIOCEPHALIC ARTERIOVENOUS (AV) FISTULA CREATION ;  Surgeon: Mal Misty, MD;  Location: [place];  Service: Vascular;  Laterality: Left;   BACK SURGERY       BLADDER SURGERY   04/26/2013    at the cancer treatment center of [place] in  [place]     COLONOSCOPY       CYSTOSCOPY W/ RETROGRADES Bilateral 07/10/2012    Procedure: [place];  Surgeon: Molli Hazard, MD;  [facility];  Service: Urology;  Laterality: Bilateral;   CYSTOSCOPY WITH URETHRAL DILATATION   01/24/2012    Procedure: CYSTOSCOPY WITH URETHRAL DILATATION;  Surgeon: Molli Hazard, MD;  [facility];  Service: Urology;  Laterality: N/A;   IR NEPHROSTOMY PLACEMENT LEFT   07/16/2016   LUMBAR LAMINECTOMY   06-02-2005    W/ RESECTION NERVE ROOT  L4  -  L5   NEPHROSTOMY TUBE PLACEMENT ([place] [facility])   2015    right    TRANSURETHRAL RESECTION OF BLADDER TUMOR   12/15/2011    Procedure: TRANSURETHRAL RESECTION OF BLADDER TUMOR (TURBT);  Surgeon: Molli Hazard, MD;  Location: [facility];  Service: Urology;  Laterality: N/A;  2 HRS     TRANSURETHRAL RESECTION OF BLADDER TUMOR   01/24/2012    Procedure:  TRANSURETHRAL RESECTION OF BLADDER TUMOR (TURBT);  Surgeon: Molli Hazard, MD;  Location: [facility];  Service: Urology;  Laterality: N/A;  90 MIN     TRANSURETHRAL RESECTION OF BLADDER TUMOR N/A 07/10/2012    Procedure: TRANSURETHRAL RESECTION OF BLADDER TUMOR (TURBT);  Surgeon: Molli Hazard, MD;  Location: [facility];  Service: Urology;  Laterality: N/A;   ulner artery repair Right 2009    rt -trauma /thrombectomy,repair      Social History:   reports that he quit smoking about 28 years ago. His smoking use included cigarettes. He has a 16.00 pack-year smoking history. He quit smokeless tobacco use about 28 years ago.  His  smokeless tobacco use included chew. He reports that he does not drink alcohol and does not use drugs.       Allergies  Allergen Reactions   Lisinopril Cough           Family History  Problem Relation Age of Onset   Heart disease Mother     Hypertension Other     Diabetes Maternal Grandmother     Brain cancer Paternal Uncle     Bladder Cancer Neg Hx     Colon cancer Neg Hx     Esophageal cancer Neg Hx     Rectal cancer Neg Hx     Stomach cancer Neg Hx                 Prior to Admission medications   Medication Sig Start Date End Date Taking? Authorizing Provider  amLODipine (NORVASC) 10 MG tablet Take 10 mg by mouth daily. 03/22/19     [provider]  AURYXIA 1 GM 210 MG(Fe) tablet Take 420 mg by mouth daily.  04/14/18     [provider]  cinacalcet (SENSIPAR) 30 MG tablet Take 30 mg by mouth every Monday, Wednesday, and Friday.       [provider]  pantoprazole (PROTONIX) 40 MG tablet Take 40 mg by mouth 2 (two) times daily. 05/11/14     [provider]  rosuvastatin (CRESTOR) 5 MG tablet Take 5 mg by mouth daily. Patient not taking: Reported on 05/06/2020 05/26/19     [provider]  sevelamer carbonate (RENVELA) 800 MG tablet Take 800 mg by mouth 3 (three) times daily.  Patient not taking: Reported on 05/06/2020 08/11/16     [provider]  sodium bicarbonate 650 MG tablet Take 650 mg by mouth 2 (two) times daily.       [provider]  venlafaxine XR (EFFEXOR-XR) 150 MG 24 hr capsule Take 1 capsule (150 mg total) by mouth daily with breakfast. 07/06/17     Ursula Alert, MD  QUEtiapine (SEROQUEL) 25 MG tablet Take 1 tablet (25 mg total) by mouth at bedtime. Patient not taking: Reported on 05/04/2018 07/06/17 05/27/19   Ursula Alert, MD      Physical Exam:   Vitals:   04/23/22 2100 04/23/22 2135 04/23/22 2145 04/23/22 2300  BP: (!) 163/76 (!) 150/106  (!) 157/75  Pulse: 90 74 96 95  Resp: '13 20 14 15  '$ SpO2:  96% 100% 100% 99%  Weight:      Height:       General: Somnolent male, protecting airways Eyes: PERRLA, pink conjunctiva, no scleral icterus ENT: Moist oral mucosa, neck supple, no thyromegaly Lungs: clear to ascultation, no wheeze, no crackles, no use of accessory muscles Cardiovascular: regular rate and rhythm, no regurgitation, no gallops,  no murmurs. No carotid bruits, no JVD Abdomen: soft, positive BS, non-tender, non-distended, no organomegaly, not an acute abdomen GU: not examined Neuro: Unable to accurately assess Musculoskeletal: Appears to move all extremities equally, no edema Skin: no rash, no subcutaneous crepitation, no decubitus Psych: Sleepy, in no distress   Labs on Admission:  Recent Labs    04/23/22 1943  NA 131*  K 4.1  CL 94*  CO2 21*  GLUCOSE 202*  BUN 24*  CREATININE 4.97*  CALCIUM 9.7   Recent Labs    04/23/22 1943  AST 27  ALT 23  ALKPHOS 63  BILITOT 0.5  PROT 7.3  ALBUMIN 3.9    Recent Labs    04/23/22 1943  WBC 7.8  NEUTROABS 6.8  HGB 11.6*  HCT 32.7*  MCV 87.0  PLT 217    Latest Reference Range & Units 04/23/22 21:33  URINALYSIS, ROUTINE W REFLEX MICROSCOPIC  Rpt !  Appearance CLEAR  CLOUDY !  Bilirubin Urine NEGATIVE  NEGATIVE  Color, Urine YELLOW  YELLOW  Glucose, UA NEGATIVE mg/dL NEGATIVE  Hgb urine dipstick NEGATIVE  MODERATE !  Ketones, ur NEGATIVE mg/dL NEGATIVE  Leukocytes,Ua NEGATIVE  LARGE !  Nitrite NEGATIVE  NEGATIVE  pH 5.0 - 8.0  7.0  Protein NEGATIVE mg/dL 100 !  Specific Gravity, Urine 1.005 - 1.030  1.009  !: Data is abnormal Rpt: View report in Results Review for more information  Micro Results: Recent Results (from the past 240 hour(s))  Resp panel by RT-PCR (RSV, Flu A&B, Covid) Anterior Nasal Swab     Status: None   Collection Time: 04/23/22  7:43 PM   Specimen: Anterior Nasal Swab  Result Value Ref Range Status   SARS Coronavirus 2 by RT PCR NEGATIVE NEGATIVE Final    Comment:  (NOTE) SARS-CoV-2 target nucleic acids are NOT DETECTED.  The SARS-CoV-2 RNA is generally detectable in upper respiratory specimens during the acute phase of infection. The lowest concentration of SARS-CoV-2 viral copies this assay can detect is 138 copies/mL. A negative result does not preclude SARS-Cov-2 infection and should not be used as the sole basis for treatment or other patient management decisions. A negative result may occur with  improper specimen collection/handling, submission of specimen other than nasopharyngeal swab, presence of viral mutation(s) within the areas targeted by this assay, and inadequate number of viral copies(<138 copies/mL). A negative result must be combined with clinical observations, patient history, and epidemiological information. The expected result is Negative.  Fact Sheet for Patients:  EntrepreneurPulse.com.au  Fact Sheet for Healthcare Providers:  IncredibleEmployment.be  This test is no t yet approved or cleared by the [place] FDA and  has been authorized for detection and/or diagnosis of SARS-CoV-2 by FDA under an Emergency Use Authorization (EUA). This EUA will remain  in effect (meaning this test can be used) for the duration of the COVID-19 declaration under Section 564(b)(1) of the Act, 21 U.S.C.section 360bbb-3(b)(1), unless the authorization is terminated  or revoked sooner.       Influenza A by PCR NEGATIVE NEGATIVE Final   Influenza B by PCR NEGATIVE NEGATIVE Final    Comment: (NOTE) The Xpert Xpress SARS-CoV-2/FLU/RSV plus assay is intended as an aid in the diagnosis of influenza from Nasopharyngeal swab specimens and should not be used as a sole basis for treatment. Nasal washings and aspirates are unacceptable for Xpert Xpress SARS-CoV-2/FLU/RSV testing.  Fact Sheet for Patients: EntrepreneurPulse.com.au  Fact Sheet for Healthcare  Providers: IncredibleEmployment.be  This test is  not yet approved or cleared by the [place] and has been authorized for detection and/or diagnosis of SARS-CoV-2 by FDA under an Emergency Use Authorization (EUA). This EUA will remain in effect (meaning this test can be used) for the duration of the COVID-19 declaration under Section 564(b)(1) of the Act, 21 U.S.C. section 360bbb-3(b)(1), unless the authorization is terminated or revoked.     Resp Syncytial Virus by PCR NEGATIVE NEGATIVE Final    Comment: (NOTE) Fact Sheet for Patients: EntrepreneurPulse.com.au  Fact Sheet for Healthcare Providers: IncredibleEmployment.be  This test is not yet approved or cleared by the [place] FDA and has been authorized for detection and/or diagnosis of SARS-CoV-2 by FDA under an Emergency Use Authorization (EUA). This EUA will remain in effect (meaning this test can be used) for the duration of the COVID-19 declaration under Section 564(b)(1) of the Act, 21 U.S.C. section 360bbb-3(b)(1), unless the authorization is terminated or revoked.  Performed at [facility], [place] [address], [place], [place] [address]      Radiological Exams on Admission: CT ANGIO HEAD NECK W WO CM W PERF (CODE STROKE)  Result Date: 04/23/2022 CLINICAL DATA:  Neuro deficit, acute, stroke suspected aphasia without motor deficits EXAM: CT ANGIOGRAPHY HEAD AND NECK CT PERFUSION BRAIN TECHNIQUE: Multidetector CT imaging of the head and neck was performed using the standard protocol during bolus administration of intravenous contrast. Multiplanar CT image reconstructions and MIPs were obtained to evaluate the vascular anatomy. Carotid stenosis measurements (when applicable) are obtained utilizing NASCET criteria, using the distal internal carotid diameter as the denominator. Multiphase CT imaging of the brain was performed following IV bolus contrast  injection. Subsequent parametric perfusion maps were calculated using RAPID software. RADIATION DOSE REDUCTION: This exam was performed according to the departmental dose-optimization program which includes automated exposure control, adjustment of the mA and/or kV according to patient size and/or use of iterative reconstruction technique. CONTRAST:  174m OMNIPAQUE IOHEXOL 350 MG/ML SOLN COMPARISON:  None Available. FINDINGS: CTA NECK FINDINGS Aortic arch: Great vessel origins are patent. Right carotid system: Calcific atherosclerosis at the carotid bifurcation without greater than 50% stenosis. Left carotid system: No evidence of dissection, stenosis (50% or greater), or occlusion. Vertebral arteries: Left dominant. No evidence of dissection, stenosis (50% or greater), or occlusion. Skeleton: Multilevel degenerative change. Other neck: No acute findings on limited assessment. Upper chest: Expiratory changes in the lung apices. Review of the MIP images confirms the above findings CTA HEAD FINDINGS Anterior circulation: Bilateral intracranial ICAs, MCAs, and ACAs are patent without proximal hemodynamically significant stenosis. Posterior circulation: Small/non dominant right vertebral artery. Bilateral vertebral arteries, basilar artery and bilateral posterior cerebral arteries are patent without proximal hemodynamically significant stenosis. Venous sinuses: Poorly evaluated due to arterial timing. CT Brain Perfusion Findings: ASPECTS: 10 CBF (<30%) Volume: 030mPerfusion (Tmax>6.0s) volume: 23m34mismatch Volume: 23mL74m the left cerebellum Infarction Location:No core infarct identified. IMPRESSION: 1. No emergent large vessel occlusion or proximal hemodynamically significant stenosis. 2. CT perfusion reports a small (5 mL) area of penumbra in the left cerebellum, which is of unclear significance. An MRI could provide more sensitive evaluation for acute infarct if clinically warranted. Electronically Signed   By:  FredMargaretha Sheffield.   On: 04/23/2022 19:39   CT HEAD CODE STROKE WO CONTRAST  Result Date: 04/23/2022 CLINICAL DATA:  Code stroke.  Neuro deficit, acute, stroke suspected EXAM: CT HEAD WITHOUT CONTRAST TECHNIQUE: Contiguous axial images were obtained from the base of the skull through  the vertex without intravenous contrast. RADIATION DOSE REDUCTION: This exam was performed according to the departmental dose-optimization program which includes automated exposure control, adjustment of the mA and/or kV according to patient size and/or use of iterative reconstruction technique. COMPARISON:  CT head 10/19/2014. FINDINGS: Brain: No evidence of acute large vascular territory infarction, hemorrhage, hydrocephalus, extra-axial collection or mass lesion/mass effect. Vascular: No hyperdense vessel. Skull: No acute fracture. Sinuses/Orbits: Clear sinuses.  No acute orbital findings. Other: No mastoid effusions. ASPECTS [facility] Stroke Program Early CT Score) total score (0-10 with 10 being normal): 10. IMPRESSION: 1. No evidence of acute intracranial abnormality. 2. ASPECTS is 10. Code stroke imaging results were communicated on 04/23/2022 at 7:19 pm to provider [facility] via secure text paging. Electronically Signed   By: Margaretha Sheffield M.D.   On: 04/23/2022 19:20    Assessment/Plan Present on Admission:  Aphasia/altered mentation -Admit to med telemetry -TIA order set initiated -Unfortunate the patient quite combative therefore received 8 mg IV Ativan and IV Keppra in order to do imaging.  Patient beginning to wake however, again becoming combative/aggressive..  Ativan ordered. -2D echo, lipid panel -Neurochecks -MRI brain ordered, or f/u CT in 24hours -Neuroconsult if MRI positive -Serial neuro checks -For now permissive hypertension -Baby aspirin daily -Patient with a penile implant.  Unable to do MRI until more information available.  Wife to bring in paperwork.  If not repeat CT head in 24  hours.  Patient's urologist is Dr. Stann Mainland at [facility] and the urology department -Addendum: Urine cloudy, with large leukocytes but no ketones.  Information regarding bacterial count and squamous cells not resulted.  Will start antibiotics and await cultures.  Elevated white blood cell count today  ESRD -Patient did complete hemodialysis today -Nephrology consult placed, nephrology aware. -Patient's bicarb, Sensipar, Renvela resumed  Hypertension -Blood pressure management per CV order set. -Patient on Norvasc 10 mg daily and metoprolol 25 mg p.o. q. of sleep.  These have been held  Depression -Effexor resumed   Soley Harriss 04/23/2022, 11:05 PM

## 2022-04-23 NOTE — ED Triage Notes (Signed)
Pt to ER via EMS from dialysis.  Pt finished treatment then was noted by staff to be altered.  Pt vomited x 2 with dialysis, x 1 with EMS.  Pt arrives, unable to follow commands, not speaking and restless in bed.  Dr. Alvino Chapel to bedside, Code stroke called at this time.

## 2022-04-23 NOTE — ED Provider Notes (Signed)
[facility] Provider Note   CSN: [id number] Arrival date & time: 04/23/22  1812  An emergency department physician performed an initial assessment on this suspected stroke patient at 27.  History  Chief Complaint  Patient presents with   Altered Mental Status    [name] [name] is a 62 y.o. male.   Altered Mental Status Patient brought in by EMS for mental status change.  Came from dialysis.  Reportedly was at dialysis.  Did vomit but per EMS report was doing well until after dialysis.  Although unable to necessarily clarify if it was when they took him off dialysis he was altered or became after.  Now aphasic although moving all extremities.  Really cannot participate in history.  Will not follow commands.  Code stroke called.  Last normal would have been about 3:00.    No past medical history on file. End-stage renal disease on dialysis. Home Medications Prior to Admission medications   Not on File      Allergies    Lisinopril    Review of Systems   Review of Systems  Physical Exam Updated Vital Signs BP (!) 163/76   Pulse 90   Resp 13   Ht '5\' 9"'$  (1.753 m)   Wt 89 kg   SpO2 96%   BMI 28.98 kg/m  Physical Exam Vitals and nursing note reviewed.  Eyes:     General: No scleral icterus.    Pupils: Pupils are equal, round, and reactive to light.  Cardiovascular:     Rate and Rhythm: Regular rhythm.  Pulmonary:     Breath sounds: No wheezing or rhonchi.  Abdominal:     Tenderness: There is no abdominal tenderness.  Musculoskeletal:     Cervical back: Neck supple.     Comments: Taped dialysis access left forearm.  Skin:    General: Skin is warm.  Neurological:     Comments: Moves all extremities and somewhat combative.  Wants to get up.  However nonverbal.  Face symmetric.     ED Results / Procedures / Treatments   Labs (all labs ordered are listed, but only abnormal results are displayed) Labs Reviewed   CBC - Abnormal; Notable for the following components:      Result Value   RBC 3.76 (*)    Hemoglobin 11.6 (*)    HCT 32.7 (*)    All other components within normal limits  DIFFERENTIAL - Abnormal; Notable for the following components:   Abs Immature Granulocytes 0.10 (*)    All other components within normal limits  COMPREHENSIVE METABOLIC PANEL - Abnormal; Notable for the following components:   Sodium 131 (*)    Chloride 94 (*)    CO2 21 (*)    Glucose, Bld 202 (*)    BUN 24 (*)    Creatinine, Ser 4.97 (*)    GFR, Estimated 12 (*)    Anion gap 16 (*)    All other components within normal limits  URINALYSIS, ROUTINE W REFLEX MICROSCOPIC - Abnormal; Notable for the following components:   APPearance CLOUDY (*)    Hgb urine dipstick MODERATE (*)    Protein, ur 100 (*)    Leukocytes,Ua LARGE (*)    All other components within normal limits  CBG MONITORING, ED - Abnormal; Notable for the following components:   Glucose-Capillary 179 (*)    All other components within normal limits  RESP PANEL BY RT-PCR (RSV, FLU A&B, COVID)  RVPGX2  ETHANOL  PROTIME-INR  APTT  RAPID URINE DRUG SCREEN, HOSP PERFORMED  I-STAT CHEM 8, ED    EKG EKG Interpretation  Date/Time:  Friday April 23 2022 19:32:51 EST Ventricular Rate:  94 PR Interval:  196 QRS Duration: 114 QT Interval:  394 QTC Calculation: 493 R Axis:   32 Text Interpretation: Sinus rhythm Borderline intraventricular conduction delay Minimal ST depression, lateral leads Borderline prolonged QT interval Confirmed by Davonna Belling [phone]) on 04/23/2022 8:39:05 PM  Radiology CT ANGIO HEAD NECK W WO CM W PERF (CODE STROKE)  Result Date: 04/23/2022 CLINICAL DATA:  Neuro deficit, acute, stroke suspected aphasia without motor deficits EXAM: CT ANGIOGRAPHY HEAD AND NECK CT PERFUSION BRAIN TECHNIQUE: Multidetector CT imaging of the head and neck was performed using the standard protocol during bolus administration of intravenous  contrast. Multiplanar CT image reconstructions and MIPs were obtained to evaluate the vascular anatomy. Carotid stenosis measurements (when applicable) are obtained utilizing NASCET criteria, using the distal internal carotid diameter as the denominator. Multiphase CT imaging of the brain was performed following IV bolus contrast injection. Subsequent parametric perfusion maps were calculated using RAPID software. RADIATION DOSE REDUCTION: This exam was performed according to the departmental dose-optimization program which includes automated exposure control, adjustment of the mA and/or kV according to patient size and/or use of iterative reconstruction technique. CONTRAST:  174m OMNIPAQUE IOHEXOL 350 MG/ML SOLN COMPARISON:  None Available. FINDINGS: CTA NECK FINDINGS Aortic arch: Great vessel origins are patent. Right carotid system: Calcific atherosclerosis at the carotid bifurcation without greater than 50% stenosis. Left carotid system: No evidence of dissection, stenosis (50% or greater), or occlusion. Vertebral arteries: Left dominant. No evidence of dissection, stenosis (50% or greater), or occlusion. Skeleton: Multilevel degenerative change. Other neck: No acute findings on limited assessment. Upper chest: Expiratory changes in the lung apices. Review of the MIP images confirms the above findings CTA HEAD FINDINGS Anterior circulation: Bilateral intracranial ICAs, MCAs, and ACAs are patent without proximal hemodynamically significant stenosis. Posterior circulation: Small/non dominant right vertebral artery. Bilateral vertebral arteries, basilar artery and bilateral posterior cerebral arteries are patent without proximal hemodynamically significant stenosis. Venous sinuses: Poorly evaluated due to arterial timing. CT Brain Perfusion Findings: ASPECTS: 10 CBF (<30%) Volume: 053mPerfusion (Tmax>6.0s) volume: 32m432mismatch Volume: 32mL61m the left cerebellum Infarction Location:No core infarct identified.  IMPRESSION: 1. No emergent large vessel occlusion or proximal hemodynamically significant stenosis. 2. CT perfusion reports a small (5 mL) area of penumbra in the left cerebellum, which is of unclear significance. An MRI could provide more sensitive evaluation for acute infarct if clinically warranted. Electronically Signed   By: FredMargaretha Sheffield.   On: 04/23/2022 19:39   CT HEAD CODE STROKE WO CONTRAST  Result Date: 04/23/2022 CLINICAL DATA:  Code stroke.  Neuro deficit, acute, stroke suspected EXAM: CT HEAD WITHOUT CONTRAST TECHNIQUE: Contiguous axial images were obtained from the base of the skull through the vertex without intravenous contrast. RADIATION DOSE REDUCTION: This exam was performed according to the departmental dose-optimization program which includes automated exposure control, adjustment of the mA and/or kV according to patient size and/or use of iterative reconstruction technique. COMPARISON:  CT head 10/19/2014. FINDINGS: Brain: No evidence of acute large vascular territory infarction, hemorrhage, hydrocephalus, extra-axial collection or mass lesion/mass effect. Vascular: No hyperdense vessel. Skull: No acute fracture. Sinuses/Orbits: Clear sinuses.  No acute orbital findings. Other: No mastoid effusions. ASPECTS ([facility] Program Early CT Score) total score (0-10 with 10 being normal): 10. IMPRESSION: 1. No evidence  of acute intracranial abnormality. 2. ASPECTS is 10. Code stroke imaging results were communicated on 04/23/2022 at 7:19 pm to provider [facility] via secure text paging. Electronically Signed   By: Margaretha Sheffield M.D.   On: 04/23/2022 19:20    Procedures Procedures    Medications Ordered in ED Medications  aspirin chewable tablet 81 mg ( Oral See Alternative 04/23/22 2048)    Or  aspirin suppository 300 mg (300 mg Rectal Given 04/23/22 2048)  LORazepam (ATIVAN) 2 MG/ML injection (2 mg  Given 04/23/22 1837)  LORazepam (ATIVAN) 2 MG/ML injection (2 mg  Given  04/23/22 1840)  LORazepam (ATIVAN) 2 MG/ML injection (2 mg  Given 04/23/22 1900)  LORazepam (ATIVAN) 2 MG/ML injection (2 mg  Given 04/23/22 1847)  ketamine (KETALAR) injection 265 mg (265 mg Intramuscular Given 04/23/22 1901)  iohexol (OMNIPAQUE) 350 MG/ML injection 100 mL (100 mLs Intravenous Contrast Given 04/23/22 1924)    ED Course/ Medical Decision Making/ A&P                             Medical Decision Making Amount and/or Complexity of Data Reviewed Labs: ordered. Radiology: ordered.  Risk Prescription drug management.   Patient came in with mental status but worried for code stroke with aphasia.  Differential diagnosis for mental status however is long.  Taken emergently to CT but was agitative enough that unable to initially get CT.  2 mg doses of Ativan given up to 8 mg but still unable to lay still enough for the CT scan.  Decision made that we needed further sedation.  IM ketamine given after lost IV access.  This helped immensely and patient was able to lay down for the scan.  I stayed at the patient's bedside/at the scanner during the time of his sedation.  Head CT reassuring.  CTA did not show any LVO.  Neurology also present at bedside.  Not a TNK candidate.  No direct cause of the encephalopathy/mental status change found.  Potentially has small cerebellar stroke on the perfusion but should not cause the severe aphasia.  Lab work otherwise reassuring.  I discussed with the patient's family member.  Will admit to unassigned medicine.  CRITICAL CARE Performed by: Davonna Belling Total critical care time: 60 minutes Critical care time was exclusive of separately billable procedures and treating other patients. Critical care was necessary to treat or prevent imminent or life-threatening deterioration. Critical care was time spent personally by me on the following activities: development of treatment plan with patient and/or surrogate as well as nursing, discussions with  consultants, evaluation of patient's response to treatment, examination of patient, obtaining history from patient or surrogate, ordering and performing treatments and interventions, ordering and review of laboratory studies, ordering and review of radiographic studies, pulse oximetry and re-evaluation of patient's condition.         Final Clinical Impression(s) / ED Diagnoses Final diagnoses:  Encephalopathy  End stage renal disease on dialysis [facility])    Rx / DC Orders ED Discharge Orders     None         Davonna Belling, MD 04/23/22 2203

## 2022-04-24 ENCOUNTER — Inpatient Hospital Stay (HOSPITAL_COMMUNITY): Payer: Medicare Other

## 2022-04-24 DIAGNOSIS — R4182 Altered mental status, unspecified: Secondary | ICD-10-CM

## 2022-04-24 DIAGNOSIS — R4701 Aphasia: Secondary | ICD-10-CM | POA: Diagnosis not present

## 2022-04-24 DIAGNOSIS — G459 Transient cerebral ischemic attack, unspecified: Secondary | ICD-10-CM

## 2022-04-24 LAB — COMPREHENSIVE METABOLIC PANEL
ALT: 26 U/L (ref 0–44)
AST: 22 U/L (ref 15–41)
Albumin: 3.8 g/dL (ref 3.5–5.0)
Alkaline Phosphatase: 61 U/L (ref 38–126)
Anion gap: 15 (ref 5–15)
BUN: 30 mg/dL — ABNORMAL HIGH (ref 8–23)
CO2: 22 mmol/L (ref 22–32)
Calcium: 9.6 mg/dL (ref 8.9–10.3)
Chloride: 96 mmol/L — ABNORMAL LOW (ref 98–111)
Creatinine, Ser: 5.79 mg/dL — ABNORMAL HIGH (ref 0.61–1.24)
GFR, Estimated: 10 mL/min — ABNORMAL LOW (ref >60)
Glucose, Bld: 112 mg/dL — ABNORMAL HIGH (ref 70–99)
Potassium: 5.4 mmol/L — ABNORMAL HIGH (ref 3.5–5.1)
Sodium: 133 mmol/L — ABNORMAL LOW (ref 135–145)
Total Bilirubin: 0.4 mg/dL (ref 0.3–1.2)
Total Protein: 7.1 g/dL (ref 6.5–8.1)

## 2022-04-24 LAB — ECHOCARDIOGRAM COMPLETE
AR max vel: 3.1 cm2
AV Area VTI: 3.28 cm2
AV Area mean vel: 3.14 cm2
AV Mean grad: 4 mmHg
AV Peak grad: 8.2 mmHg
Ao pk vel: 1.43 m/s
Area-P 1/2: 2.11 cm2
Calc EF: 51.2 %
Height: 69 in
P 1/2 time: 399 msec
S' Lateral: 4.5 cm
Single Plane A2C EF: 49.8 %
Single Plane A4C EF: 51 %
Weight: 3139.35 oz

## 2022-04-24 LAB — CBC
HCT: 32.8 % — ABNORMAL LOW (ref 39.0–52.0)
HCT: 33.1 % — ABNORMAL LOW (ref 39.0–52.0)
Hemoglobin: 11.2 g/dL — ABNORMAL LOW (ref 13.0–17.0)
Hemoglobin: 11.3 g/dL — ABNORMAL LOW (ref 13.0–17.0)
MCH: 29.8 pg (ref 26.0–34.0)
MCH: 29.8 pg (ref 26.0–34.0)
MCHC: 33.8 g/dL (ref 30.0–36.0)
MCHC: 34.5 g/dL (ref 30.0–36.0)
MCV: 86.5 fL (ref 80.0–100.0)
MCV: 88 fL (ref 80.0–100.0)
Platelets: 252 10*3/uL (ref 150–400)
Platelets: 256 10*3/uL (ref 150–400)
RBC: 3.76 MIL/uL — ABNORMAL LOW (ref 4.22–5.81)
RBC: 3.79 MIL/uL — ABNORMAL LOW (ref 4.22–5.81)
RDW: 14.1 % (ref 11.5–15.5)
RDW: 14.3 % (ref 11.5–15.5)
WBC: 15.1 10*3/uL — ABNORMAL HIGH (ref 4.0–10.5)
WBC: 15.1 10*3/uL — ABNORMAL HIGH (ref 4.0–10.5)
nRBC: 0 % (ref 0.0–0.2)
nRBC: 0 % (ref 0.0–0.2)

## 2022-04-24 LAB — LIPID PANEL
Cholesterol: 215 mg/dL — ABNORMAL HIGH (ref 0–200)
HDL: 38 mg/dL — ABNORMAL LOW (ref >40)
Total CHOL/HDL Ratio: 5.7 RATIO
Triglycerides: 422 mg/dL — ABNORMAL HIGH (ref <150)
VLDL: UNDETERMINED mg/dL (ref 0–40)

## 2022-04-24 LAB — CREATININE, SERUM
Creatinine, Ser: 5.52 mg/dL — ABNORMAL HIGH (ref 0.61–1.24)
GFR, Estimated: 11 mL/min — ABNORMAL LOW (ref >60)

## 2022-04-24 LAB — HIV ANTIBODY (ROUTINE TESTING W REFLEX): HIV Screen 4th Generation wRfx: NONREACTIVE

## 2022-04-24 LAB — HEMOGLOBIN A1C
Hgb A1c MFr Bld: 7.7 % — ABNORMAL HIGH (ref 4.8–5.6)
Mean Plasma Glucose: 174.29 mg/dL

## 2022-04-24 LAB — HEPATITIS B SURFACE ANTIGEN: Hepatitis B Surface Ag: NONREACTIVE

## 2022-04-24 LAB — LACTATE DEHYDROGENASE: LDH: 141 U/L (ref 98–192)

## 2022-04-24 MED ORDER — SODIUM CHLORIDE 0.9 % IV SOLN
1.0000 g | INTRAVENOUS | Status: DC
  Administered 2022-04-24 – 2022-04-25 (×2): 1 g via INTRAVENOUS
  Filled 2022-04-24 (×2): qty 10

## 2022-04-24 MED ORDER — METOPROLOL TARTRATE 25 MG PO TABS
25.0000 mg | ORAL_TABLET | Freq: Two times a day (BID) | ORAL | Status: DC
  Administered 2022-04-24 – 2022-04-25 (×2): 25 mg via ORAL
  Filled 2022-04-24 (×2): qty 1

## 2022-04-24 MED ORDER — CINACALCET HCL 30 MG PO TABS: 30.0000 mg | ORAL_TABLET | ORAL | Status: DC

## 2022-04-24 MED ORDER — ROSUVASTATIN CALCIUM 5 MG PO TABS
5.0000 mg | ORAL_TABLET | Freq: Every day | ORAL | Status: DC
  Administered 2022-04-24 – 2022-04-25 (×2): 5 mg via ORAL
  Filled 2022-04-24 (×2): qty 1

## 2022-04-24 MED ORDER — SEVELAMER CARBONATE 800 MG PO TABS
800.0000 mg | ORAL_TABLET | Freq: Three times a day (TID) | ORAL | Status: DC
  Administered 2022-04-24 – 2022-04-25 (×4): 800 mg via ORAL
  Filled 2022-04-24 (×4): qty 1

## 2022-04-24 MED ORDER — LORAZEPAM 2 MG/ML IJ SOLN: 2.0000 mg | INTRAMUSCULAR | Status: DC | PRN

## 2022-04-24 MED ORDER — HYDRALAZINE HCL 20 MG/ML IJ SOLN: 5.0000 mg | INTRAMUSCULAR | Status: DC | PRN

## 2022-04-24 MED ORDER — HYDRALAZINE HCL 20 MG/ML IJ SOLN: 10.0000 mg | Freq: Four times a day (QID) | INTRAMUSCULAR | Status: DC | PRN

## 2022-04-24 MED ORDER — VENLAFAXINE HCL ER 75 MG PO CP24
150.0000 mg | ORAL_CAPSULE | Freq: Every day | ORAL | Status: DC
  Administered 2022-04-24 – 2022-04-25 (×2): 150 mg via ORAL
  Filled 2022-04-24 (×2): qty 2

## 2022-04-24 MED ORDER — CHLORHEXIDINE GLUCONATE CLOTH 2 % EX PADS
6.0000 | MEDICATED_PAD | Freq: Every day | CUTANEOUS | Status: DC
  Administered 2022-04-25: 6 via TOPICAL

## 2022-04-24 MED ORDER — METOPROLOL TARTRATE 5 MG/5ML IV SOLN: 5.0000 mg | INTRAVENOUS | Status: DC | PRN

## 2022-04-24 NOTE — ED Notes (Signed)
Unable to obtain blood cultures, Attempts made by 2 Rns and phlebotomy.

## 2022-04-24 NOTE — ED Notes (Signed)
Pt transported to MRI 

## 2022-04-24 NOTE — Progress Notes (Signed)
  Echocardiogram 2D Echocardiogram has been performed.  [name] [name] 04/24/2022, 12:03 PM

## 2022-04-24 NOTE — Consult Note (Signed)
[facility] Renal Consultation Note    Indication for Consultation:  Management of ESRD/hemodialysis; anemia, hypertension/volume and secondary hyperparathyroidism  PCP:No primary care provider on file.  HPI: [name] [name] is a 62 y.o. male with ESRD on HD MF at [facility].  Past medical history significant for HTN, DM type 2, bladder cancer s/p resection and GERD.    Patient presented to the ED yesterday following dialysis due to aphasia.  Per patient near the end of his treatment he developed a terrible headache, his blood pressure elevated and he became confused with word finding difficultly.  Dialysis unit called EMS and transported patient to the ED.  In the ED patient altered, combative and with aphasia.  Required ativan and ketamine to calm down for CT.  Today, he is tired but easily wakes for exam.  Just walked to the bathroom and back without difficultly.  Wife reports he had a bad headache on Monday after dialysis as well.  Prior to this he was not having any issues.  Previously tolerating dialysis well 2 days a week, using LU AVF. Reports he typically does not have elevated potassium and does not take medication to lower it.  No new medications, sick contacts or wounds.  Denies CP, SOB, abdominal pain, n/v/d, fevers, chills, weakness, dizziness, fatigue and confusion.    Per nurse at HD, patient BP dropped, he received small bolus fluids and BP became elevated.  He vomited x2 then became confused and developed aphasia.  Of note they report similar symptoms in a patient using the same machine this AM.  Machine has been pulled for testing/evaluation.  Disinfection/safety checks of machine completed on schedule.   Pertinent findings in the ED include K 5.4, BUN 30, WBC 15.1, Hgb 11.2, negative resp panel, neg tox screen, CTA with no large vessel occulusion or proximal hemodynamically significant stenosis; small area of unclear significance.    No past medical history on  file.  No family history on file. Social History:  has no history on file for tobacco use, alcohol use, and drug use. Allergies  Allergen Reactions   Lisinopril Cough   Prior to Admission medications   Medication Sig Start Date End Date Taking? Authorizing Provider  amLODipine (NORVASC) 10 MG tablet Take 10 mg by mouth daily. 11/16/21  Yes [provider]  Calcium Acetate 667 MG TABS Take 1,334 mg by mouth with breakfast, with lunch, and with evening meal.   Yes [provider]  metoprolol tartrate (LOPRESSOR) 25 MG tablet Take 25 mg by mouth at bedtime.   Yes [provider]  pantoprazole (PROTONIX) 40 MG tablet Take 80 mg by mouth daily.   Yes [provider]  sevelamer carbonate (RENVELA) 800 MG tablet Take 1,600 mg by mouth 3 (three) times daily with meals.   Yes [provider]  venlafaxine XR (EFFEXOR-XR) 150 MG 24 hr capsule Take 150 mg by mouth daily with breakfast.   Yes [provider]   Current Facility-Administered Medications  Medication Dose Route Frequency Provider Last Rate Last Admin    stroke: early stages of recovery book   Does not apply Once Quintella Baton, MD       acetaminophen (TYLENOL) tablet 650 mg  650 mg Oral Q4H PRN Crosley, Debby, MD       Or   acetaminophen (TYLENOL) 160 MG/5ML solution 650 mg  650 mg Per Tube Q4H PRN Quintella Baton, MD       Or   acetaminophen (TYLENOL) suppository  650 mg  650 mg Rectal Q4H PRN Quintella Baton, MD       aspirin chewable tablet 81 mg  81 mg Oral Daily Derek Jack, MD       Or   aspirin suppository 300 mg  300 mg Rectal Daily Derek Jack, MD   300 mg at 04/23/22 2048   cefTRIAXone (ROCEPHIN) 1 g in sodium chloride 0.9 % 100 mL IVPB  1 g Intravenous Q24H Crosley, Debby, MD       Chlorhexidine Gluconate Cloth 2 % PADS 6 each  6 each Topical Q0600 Madelon Lips, MD       Derrill Memo ON 04/26/2022] cinacalcet (SENSIPAR) tablet 30 mg  30 mg Oral Q M,W,F Crosley, Debby,  MD       heparin injection 5,000 Units  5,000 Units Subcutaneous Q8H Quintella Baton, MD   5,000 Units at 04/24/22 7989   hydrALAZINE (APRESOLINE) injection 10 mg  10 mg Intravenous Q6H PRN Thurnell Lose, MD       LORazepam (ATIVAN) injection 2 mg  2 mg Intravenous Q4H PRN Quintella Baton, MD       metoprolol tartrate (LOPRESSOR) tablet 25 mg  25 mg Oral BID Thurnell Lose, MD       rosuvastatin (CRESTOR) tablet 5 mg  5 mg Oral Daily Crosley, Debby, MD       senna-docusate (Senokot-S) tablet 1 tablet  1 tablet Oral QHS PRN Crosley, Debby, MD       sevelamer carbonate (RENVELA) tablet 800 mg  800 mg Oral TID WC Crosley, Debby, MD       venlafaxine XR (EFFEXOR-XR) 24 hr capsule 150 mg  150 mg Oral Q breakfast Crosley, Debby, MD       Current Outpatient Medications  Medication Sig Dispense Refill   amLODipine (NORVASC) 10 MG tablet Take 10 mg by mouth daily.     Calcium Acetate 667 MG TABS Take 1,334 mg by mouth with breakfast, with lunch, and with evening meal.     metoprolol tartrate (LOPRESSOR) 25 MG tablet Take 25 mg by mouth at bedtime.     pantoprazole (PROTONIX) 40 MG tablet Take 80 mg by mouth daily.     sevelamer carbonate (RENVELA) 800 MG tablet Take 1,600 mg by mouth 3 (three) times daily with meals.     venlafaxine XR (EFFEXOR-XR) 150 MG 24 hr capsule Take 150 mg by mouth daily with breakfast.     Labs: Basic Metabolic Panel: Recent Labs  Lab 04/23/22 1943 04/24/22 0015 04/24/22 0525  NA 131*  --  133*  K 4.1  --  5.4*  CL 94*  --  96*  CO2 21*  --  22  GLUCOSE 202*  --  112*  BUN 24*  --  30*  CREATININE 4.97* 5.52* 5.79*  CALCIUM 9.7  --  9.6   Liver Function Tests: Recent Labs  Lab 04/23/22 1943 04/24/22 0525  AST 27 22  ALT 23 26  ALKPHOS 63 61  BILITOT 0.5 0.4  PROT 7.3 7.1  ALBUMIN 3.9 3.8   CBC: Recent Labs  Lab 04/23/22 1943 04/24/22 0015 04/24/22 0525  WBC 7.8 15.1* 15.1*  NEUTROABS 6.8  --   --   HGB 11.6* 11.3* 11.2*  HCT 32.7* 32.8*  33.1*  MCV 87.0 86.5 88.0  PLT 217 252 256   CBG: Recent Labs  Lab 04/23/22 1824  GLUCAP 179*   Studies/Results: CT ANGIO HEAD NECK W WO CM W PERF (CODE STROKE)  Result Date: 04/23/2022 CLINICAL DATA:  Neuro deficit, acute, stroke suspected aphasia without motor deficits EXAM: CT ANGIOGRAPHY HEAD AND NECK CT PERFUSION BRAIN TECHNIQUE: Multidetector CT imaging of the head and neck was performed using the standard protocol during bolus administration of intravenous contrast. Multiplanar CT image reconstructions and MIPs were obtained to evaluate the vascular anatomy. Carotid stenosis measurements (when applicable) are obtained utilizing NASCET criteria, using the distal internal carotid diameter as the denominator. Multiphase CT imaging of the brain was performed following IV bolus contrast injection. Subsequent parametric perfusion maps were calculated using RAPID software. RADIATION DOSE REDUCTION: This exam was performed according to the departmental dose-optimization program which includes automated exposure control, adjustment of the mA and/or kV according to patient size and/or use of iterative reconstruction technique. CONTRAST:  160m OMNIPAQUE IOHEXOL 350 MG/ML SOLN COMPARISON:  None Available. FINDINGS: CTA NECK FINDINGS Aortic arch: Great vessel origins are patent. Right carotid system: Calcific atherosclerosis at the carotid bifurcation without greater than 50% stenosis. Left carotid system: No evidence of dissection, stenosis (50% or greater), or occlusion. Vertebral arteries: Left dominant. No evidence of dissection, stenosis (50% or greater), or occlusion. Skeleton: Multilevel degenerative change. Other neck: No acute findings on limited assessment. Upper chest: Expiratory changes in the lung apices. Review of the MIP images confirms the above findings CTA HEAD FINDINGS Anterior circulation: Bilateral intracranial ICAs, MCAs, and ACAs are patent without proximal hemodynamically significant  stenosis. Posterior circulation: Small/non dominant right vertebral artery. Bilateral vertebral arteries, basilar artery and bilateral posterior cerebral arteries are patent without proximal hemodynamically significant stenosis. Venous sinuses: Poorly evaluated due to arterial timing. CT Brain Perfusion Findings: ASPECTS: 10 CBF (<30%) Volume: 067mPerfusion (Tmax>6.0s) volume: 31m71mismatch Volume: 31mL44m the left cerebellum Infarction Location:No core infarct identified. IMPRESSION: 1. No emergent large vessel occlusion or proximal hemodynamically significant stenosis. 2. CT perfusion reports a small (5 mL) area of penumbra in the left cerebellum, which is of unclear significance. An MRI could provide more sensitive evaluation for acute infarct if clinically warranted. Electronically Signed   By: FredMargaretha Sheffield.   On: 04/23/2022 19:39   CT HEAD CODE STROKE WO CONTRAST  Result Date: 04/23/2022 CLINICAL DATA:  Code stroke.  Neuro deficit, acute, stroke suspected EXAM: CT HEAD WITHOUT CONTRAST TECHNIQUE: Contiguous axial images were obtained from the base of the skull through the vertex without intravenous contrast. RADIATION DOSE REDUCTION: This exam was performed according to the departmental dose-optimization program which includes automated exposure control, adjustment of the mA and/or kV according to patient size and/or use of iterative reconstruction technique. COMPARISON:  CT head 10/19/2014. FINDINGS: Brain: No evidence of acute large vascular territory infarction, hemorrhage, hydrocephalus, extra-axial collection or mass lesion/mass effect. Vascular: No hyperdense vessel. Skull: No acute fracture. Sinuses/Orbits: Clear sinuses.  No acute orbital findings. Other: No mastoid effusions. ASPECTS ([facility] Program Early CT Score) total score (0-10 with 10 being normal): 10. IMPRESSION: 1. No evidence of acute intracranial abnormality. 2. ASPECTS is 10. Code stroke imaging results were communicated  on 04/23/2022 at 7:19 pm to provider [facility] secure text paging. Electronically Signed   By: FredMargaretha Sheffield.   On: 04/23/2022 19:20    ROS: All others negative except those listed in HPI.  Physical Exam: Vitals:   04/24/22 0300 04/24/22 0400 04/24/22 0500 04/24/22 0600  BP: (!) 164/81 (!) 171/90 (!) 142/79 (!) 154/84  Pulse: 91 (!) 105 89 98  Resp: '15 19 15 19  '$ SpO2: 99% 99% 96% 99%  Weight:      Height:         General: WDWN male NAD Head: NCAT sclera not icteric MMM Neck: Supple. No lymphadenopathy Lungs: CTA bilaterally. No wheeze, rales or rhonchi. Breathing is unlabored. Heart: RRR. No murmur, rubs or gallops.  Abdomen: soft, nontender, +BS, no guarding, no rebound tenderness  Lower extremities:no edema, ischemic changes, or open wounds  Neuro: AAOx3. Moves all extremities spontaneously. Psych:  Responds to questions appropriately with a normal affect. Dialysis Access: LU AVF +b/t  Dialysis Orders:  MF - [facility]  4hrs, BFR 400, DFR AF 1.5,  EDW 89.4kg, 2K/ 3.5Ca P2  Access: LU AVF  Heparin 5000 unit bolus, 2500 unit intermittent Calcitriol 58mg PO qHD  Assessment/Plan:  AMS - Hypertension, severe HA, confusion and aphasia - Back to baseline.  Began at OP HD.  Neurology following, CTA r/o major stroke.  Checking EEG.  Tox report negative.  WBC elevated.  BC ordered.  Checking LDH, haptoglobin to r/o hemolytic anemia. Per HD unit patient using same machine today showed similar symptoms.  Checking machine for potential contaminants. Completed regular testing/disinfection on schedule.  Hyperkalemia - K 5.4 today.  Plan for 3hr treatment.   ESRD -  on HD MF. Extra HD today as noted above, see how patient tolerates treatment.   Hypertension/volume  - BP elevated.  Does not appear volume overloaded.  UF as tolerated.   Anemia of CKD - Hgb 11.2. Not on ESA.  Secondary Hyperparathyroidism -  Ca in goal. Check phos.  Continue VDRA.  Nutrition - Renal diet w/fluids  restrictions when eating.   LJen Mow PA-C [employer]1/20/2024, 9:02 AM

## 2022-04-24 NOTE — ED Notes (Signed)
ED TO INPATIENT HANDOFF REPORT  ED Nurse Name and Phone #:  Nicki Reaper [phone]  S Name/Age/Gender [name] [name] 62 y.o. male Room/Bed: 041C/041C  Code Status   Code Status: Full Code  Home/SNF/Other Home Patient oriented to: self, place, time, and situation Is this baseline? Yes      Chief Complaint Aphasia [R47.01]  Triage Note Pt to ER via EMS from dialysis.  Pt finished treatment then was noted by staff to be altered.  Pt vomited x 2 with dialysis, x 1 with EMS.  Pt arrives, unable to follow commands, not speaking and restless in bed.  Dr. Alvino Chapel to bedside, Code stroke called at this time.   Allergies Allergies  Allergen Reactions   Lisinopril Cough    Level of Care/Admitting Diagnosis ED Disposition     ED Disposition  Admit   Condition  --   Comment  Hospital Area: [facility] [[id number]]  Level of Care: Telemetry Medical [[id number]]  May admit patient to [facility] or [facility] if equivalent level of care is available:: No  Covid Evaluation: Confirmed COVID Negative  Diagnosis: Aphasia Marvelous.Norman.3.ICD-9-CM]  Admitting Physician: Quintella Baton [[id number]]  Attending Physician: Quintella Baton [[id number]]  Certification:: I certify this patient will need inpatient services for at least 2 midnights  Estimated Length of Stay: 2          B Medical/Surgery History No past medical history on file.    A IV Location/Drains/Wounds Patient Lines/Drains/Airways Status     Active Line/Drains/Airways     Name Placement date Placement time Site Days   Peripheral IV 04/23/22 18 G Right Antecubital 04/23/22  1910  Antecubital  1            Intake/Output Last 24 hours  Intake/Output Summary (Last 24 hours) at 04/24/2022 1442 Last data filed at 04/24/2022 1018 Gross per 24 hour  Intake 100 ml  Output --  Net 100 ml    Labs/Imaging Results for orders placed or performed during the hospital encounter of 04/23/22 (from the past 48 hour(s))  CBG  monitoring, ED     Status: Abnormal   Collection Time: 04/23/22  6:24 PM  Result Value Ref Range   Glucose-Capillary 179 (H) 70 - 99 mg/dL    Comment: Glucose reference range applies only to samples taken after fasting for at least 8 hours.  Ethanol     Status: None   Collection Time: 04/23/22  7:43 PM  Result Value Ref Range   Alcohol, Ethyl (B) <10 <10 mg/dL    Comment: (NOTE) Lowest detectable limit for serum alcohol is 10 mg/dL.  For medical purposes only. Performed at [facility], [place] [address] [place], [place] [address]   Protime-INR     Status: None   Collection Time: 04/23/22  7:43 PM  Result Value Ref Range   Prothrombin Time 12.4 11.4 - 15.2 seconds   INR 0.9 0.8 - 1.2    Comment: (NOTE) INR goal varies based on device and disease states. Performed at [facility], [place] [address] [place], [place] [address]   APTT     Status: None   Collection Time: 04/23/22  7:43 PM  Result Value Ref Range   aPTT 28 24 - 36 seconds    Comment: Performed at [facility] [address] [place], [place] [address]  CBC     Status: Abnormal   Collection Time: 04/23/22  7:43 PM  Result Value Ref Range  WBC 7.8 4.0 - 10.5 K/uL   RBC 3.76 (L) 4.22 - 5.81 MIL/uL   Hemoglobin 11.6 (L) 13.0 - 17.0 g/dL   HCT 32.7 (L) 39.0 - 52.0 %   MCV 87.0 80.0 - 100.0 fL   MCH 30.9 26.0 - 34.0 pg   MCHC 35.5 30.0 - 36.0 g/dL   RDW 14.1 11.5 - 15.5 %   Platelets 217 150 - 400 K/uL   nRBC 0.0 0.0 - 0.2 %    Comment: Performed at [facility], [place] [address] [place], [place] [address]  Differential     Status: Abnormal   Collection Time: 04/23/22  7:43 PM  Result Value Ref Range   Neutrophils Relative % 87 %   Neutro Abs 6.8 1.7 - 7.7 K/uL   Lymphocytes Relative 9 %   Lymphs Abs 0.7 0.7 - 4.0 K/uL   Monocytes Relative 3 %   Monocytes Absolute 0.2 0.1 - 1.0 K/uL   Eosinophils Relative 0 %   Eosinophils Absolute 0.0 0.0 - 0.5 K/uL   Basophils Relative 0 %    Basophils Absolute 0.0 0.0 - 0.1 K/uL   Immature Granulocytes 1 %   Abs Immature Granulocytes 0.10 (H) 0.00 - 0.07 K/uL    Comment: Performed at [facility] [address] [place], [place] [address]  Comprehensive metabolic panel     Status: Abnormal   Collection Time: 04/23/22  7:43 PM  Result Value Ref Range   Sodium 131 (L) 135 - 145 mmol/L   Potassium 4.1 3.5 - 5.1 mmol/L   Chloride 94 (L) 98 - 111 mmol/L   CO2 21 (L) 22 - 32 mmol/L   Glucose, Bld 202 (H) 70 - 99 mg/dL    Comment: Glucose reference range applies only to samples taken after fasting for at least 8 hours.   BUN 24 (H) 8 - 23 mg/dL   Creatinine, Ser 4.97 (H) 0.61 - 1.24 mg/dL   Calcium 9.7 8.9 - 10.3 mg/dL   Total Protein 7.3 6.5 - 8.1 g/dL    Comment: POST-ULTRACENTRIFUGATION   Albumin 3.9 3.5 - 5.0 g/dL   AST 27 15 - 41 U/L   ALT 23 0 - 44 U/L   Alkaline Phosphatase 63 38 - 126 U/L   Total Bilirubin 0.5 0.3 - 1.2 mg/dL   GFR, Estimated 12 (L) >60 mL/min    Comment: (NOTE) Calculated using the CKD-EPI Creatinine Equation (2021)    Anion gap 16 (H) 5 - 15    Comment: Performed at [facility] [address] [place], [place] [address]  Resp panel by RT-PCR (RSV, Flu A&B, Covid) Anterior Nasal Swab     Status: None   Collection Time: 04/23/22  7:43 PM   Specimen: Anterior Nasal Swab  Result Value Ref Range   SARS Coronavirus 2 by RT PCR NEGATIVE NEGATIVE    Comment: (NOTE) SARS-CoV-2 target nucleic acids are NOT DETECTED.  The SARS-CoV-2 RNA is generally detectable in upper respiratory specimens during the acute phase of infection. The lowest concentration of SARS-CoV-2 viral copies this assay can detect is 138 copies/mL. A negative result does not preclude SARS-Cov-2 infection and should not be used as the sole basis for treatment or other patient management decisions. A negative result may occur with  improper specimen collection/handling, submission of specimen other than nasopharyngeal  swab, presence of viral mutation(s) within the areas targeted by this assay, and inadequate number of viral copies(<138 copies/mL). A negative result  must be combined with clinical observations, patient history, and epidemiological information. The expected result is Negative.  Fact Sheet for Patients:  EntrepreneurPulse.com.au  Fact Sheet for Healthcare Providers:  IncredibleEmployment.be  This test is no t yet approved or cleared by the [place] FDA and  has been authorized for detection and/or diagnosis of SARS-CoV-2 by FDA under an Emergency Use Authorization (EUA). This EUA will remain  in effect (meaning this test can be used) for the duration of the COVID-19 declaration under Section 564(b)(1) of the Act, 21 U.S.C.section 360bbb-3(b)(1), unless the authorization is terminated  or revoked sooner.       Influenza A by PCR NEGATIVE NEGATIVE   Influenza B by PCR NEGATIVE NEGATIVE    Comment: (NOTE) The Xpert Xpress SARS-CoV-2/FLU/RSV plus assay is intended as an aid in the diagnosis of influenza from Nasopharyngeal swab specimens and should not be used as a sole basis for treatment. Nasal washings and aspirates are unacceptable for Xpert Xpress SARS-CoV-2/FLU/RSV testing.  Fact Sheet for Patients: EntrepreneurPulse.com.au  Fact Sheet for Healthcare Providers: IncredibleEmployment.be  This test is not yet approved or cleared by the [place] FDA and has been authorized for detection and/or diagnosis of SARS-CoV-2 by FDA under an Emergency Use Authorization (EUA). This EUA will remain in effect (meaning this test can be used) for the duration of the COVID-19 declaration under Section 564(b)(1) of the Act, 21 U.S.C. section 360bbb-3(b)(1), unless the authorization is terminated or revoked.     Resp Syncytial Virus by PCR NEGATIVE NEGATIVE    Comment: (NOTE) Fact Sheet for  Patients: EntrepreneurPulse.com.au  Fact Sheet for Healthcare Providers: IncredibleEmployment.be  This test is not yet approved or cleared by the [place] FDA and has been authorized for detection and/or diagnosis of SARS-CoV-2 by FDA under an Emergency Use Authorization (EUA). This EUA will remain in effect (meaning this test can be used) for the duration of the COVID-19 declaration under Section 564(b)(1) of the Act, 21 U.S.C. section 360bbb-3(b)(1), unless the authorization is terminated or revoked.  Performed at [facility], [place] [address] [place], [place] [address]   Hemoglobin A1c     Status: Abnormal   Collection Time: 04/23/22  7:43 PM  Result Value Ref Range   Hgb A1c MFr Bld 7.7 (H) 4.8 - 5.6 %    Comment: (NOTE) Pre diabetes:          5.7%-6.4%  Diabetes:              >6.4%  Glycemic control for   <7.0% adults with diabetes    Mean Plasma Glucose 174.29 mg/dL    Comment: Performed at [facility] [address] [place], [place] [address]  HIV Antibody (routine testing w rflx)     Status: None   Collection Time: 04/23/22  9:23 PM  Result Value Ref Range   HIV Screen 4th Generation wRfx Non Reactive Non Reactive    Comment: Performed at [facility], [place] [address] [place], [place] [address]  Urine rapid drug screen (hosp performed)     Status: None   Collection Time: 04/23/22  9:33 PM  Result Value Ref Range   Opiates NONE DETECTED NONE DETECTED   Cocaine NONE DETECTED NONE DETECTED   Benzodiazepines NONE DETECTED NONE DETECTED   Amphetamines NONE DETECTED NONE DETECTED   Tetrahydrocannabinol NONE DETECTED NONE DETECTED   Barbiturates NONE DETECTED NONE DETECTED    Comment: (NOTE) DRUG SCREEN FOR MEDICAL PURPOSES ONLY.  IF  CONFIRMATION IS NEEDED FOR ANY PURPOSE, NOTIFY LAB WITHIN 5 DAYS.  LOWEST DETECTABLE LIMITS FOR URINE DRUG SCREEN Drug Class                     Cutoff (ng/mL) Amphetamine  and metabolites    1000 Barbiturate and metabolites    200 Benzodiazepine                 200 Opiates and metabolites        300 Cocaine and metabolites        300 THC                            50 Performed at [facility], [place] [address], [place], [place] [address]   Urinalysis, Routine w reflex microscopic Urine, Catheterized     Status: Abnormal   Collection Time: 04/23/22  9:33 PM  Result Value Ref Range   Color, Urine YELLOW YELLOW   APPearance CLOUDY (A) CLEAR   Specific Gravity, Urine 1.009 1.005 - 1.030   pH 7.0 5.0 - 8.0   Glucose, UA NEGATIVE NEGATIVE mg/dL   Hgb urine dipstick MODERATE (A) NEGATIVE   Bilirubin Urine NEGATIVE NEGATIVE   Ketones, ur NEGATIVE NEGATIVE mg/dL   Protein, ur 100 (A) NEGATIVE mg/dL   Nitrite NEGATIVE NEGATIVE   Leukocytes,Ua LARGE (A) NEGATIVE    Comment: Performed at [facility] [address] [place], [place] [address]  CBC     Status: Abnormal   Collection Time: 04/24/22 12:15 AM  Result Value Ref Range   WBC 15.1 (H) 4.0 - 10.5 K/uL   RBC 3.79 (L) 4.22 - 5.81 MIL/uL   Hemoglobin 11.3 (L) 13.0 - 17.0 g/dL   HCT 32.8 (L) 39.0 - 52.0 %   MCV 86.5 80.0 - 100.0 fL   MCH 29.8 26.0 - 34.0 pg   MCHC 34.5 30.0 - 36.0 g/dL   RDW 14.1 11.5 - 15.5 %   Platelets 252 150 - 400 K/uL   nRBC 0.0 0.0 - 0.2 %    Comment: Performed at [facility], [place] [address], [place], [place] [address]  Creatinine, serum     Status: Abnormal   Collection Time: 04/24/22 12:15 AM  Result Value Ref Range   Creatinine, Ser 5.52 (H) 0.61 - 1.24 mg/dL   GFR, Estimated 11 (L) >60 mL/min    Comment: (NOTE) Calculated using the CKD-EPI Creatinine Equation (2021) Performed at [facility] [address] [place], [place] [address]   CBC     Status: Abnormal   Collection Time: 04/24/22  5:25 AM  Result Value Ref Range   WBC 15.1 (H) 4.0 - 10.5 K/uL   RBC 3.76 (L) 4.22 - 5.81 MIL/uL   Hemoglobin 11.2 (L) 13.0 - 17.0 g/dL   HCT 33.1 (L)  39.0 - 52.0 %   MCV 88.0 80.0 - 100.0 fL   MCH 29.8 26.0 - 34.0 pg   MCHC 33.8 30.0 - 36.0 g/dL   RDW 14.3 11.5 - 15.5 %   Platelets 256 150 - 400 K/uL   nRBC 0.0 0.0 - 0.2 %    Comment: Performed at [facility], [place] [address], [place], [place] [address]  Comprehensive metabolic panel     Status: Abnormal   Collection Time: 04/24/22  5:25 AM  Result Value Ref Range   Sodium 133 (L) 135 - 145  mmol/L   Potassium 5.4 (H) 3.5 - 5.1 mmol/L   Chloride 96 (L) 98 - 111 mmol/L   CO2 22 22 - 32 mmol/L   Glucose, Bld 112 (H) 70 - 99 mg/dL    Comment: Glucose reference range applies only to samples taken after fasting for at least 8 hours.   BUN 30 (H) 8 - 23 mg/dL   Creatinine, Ser 5.79 (H) 0.61 - 1.24 mg/dL   Calcium 9.6 8.9 - 10.3 mg/dL   Total Protein 7.1 6.5 - 8.1 g/dL   Albumin 3.8 3.5 - 5.0 g/dL   AST 22 15 - 41 U/L   ALT 26 0 - 44 U/L   Alkaline Phosphatase 61 38 - 126 U/L   Total Bilirubin 0.4 0.3 - 1.2 mg/dL   GFR, Estimated 10 (L) >60 mL/min    Comment: (NOTE) Calculated using the CKD-EPI Creatinine Equation (2021)    Anion gap 15 5 - 15    Comment: Performed at [facility] [address], [place], [place] [address]  Lipid panel     Status: Abnormal   Collection Time: 04/24/22  5:25 AM  Result Value Ref Range   Cholesterol 215 (H) 0 - 200 mg/dL   Triglycerides 422 (H) <150 mg/dL   HDL 38 (L) >40 mg/dL   Total CHOL/HDL Ratio 5.7 RATIO   VLDL UNABLE TO CALCULATE IF TRIGLYCERIDE OVER 400 mg/dL 0 - 40 mg/dL   LDL Cholesterol NOT CALCULATED 0 - 99 mg/dL    Comment: Performed at [facility] [address] [place], [place] [address]  Hepatitis B surface antigen     Status: None   Collection Time: 04/24/22  9:13 AM  Result Value Ref Range   Hepatitis B Surface Ag NON REACTIVE NON REACTIVE    Comment: Performed at [facility] [address] [place], [place] [address]  Lactate dehydrogenase     Status: None   Collection Time: 04/24/22  9:15 AM   Result Value Ref Range   LDH 141 98 - 192 U/L    Comment: Performed at [facility], [place] [address] [place], [place] [address]   ECHOCARDIOGRAM COMPLETE  Result Date: 04/24/2022    ECHOCARDIOGRAM REPORT   Patient Name:   Kabeer Hoagland Date of Exam: 04/24/2022 Medical Rec #:  [id number]       Height:       69.0 in Accession #:    [id number]      Weight:       196.2 lb Date of Birth:  06-29-1960        BSA:          2.049 m Patient Age:    23 years        BP:           154/84 mmHg Patient Gender: M               HR:           76 bpm. Exam Location:  Inpatient Procedure: 2D Echo, Cardiac Doppler and Color Doppler Indications:    TIA  History:        Patient has no prior history of Echocardiogram examinations.                 Risk Factors:Diabetes and Hypertension. ESRD.  Sonographer:    Clayton Lefort RDCS (AE) Referring Phys: [facility]  Sonographer Comments: Suboptimal subcostal window. IMPRESSIONS  1. Left ventricular ejection fraction,  by estimation, is 45 to 50%. The left ventricle has mildly decreased function. The left ventricle demonstrates global hypokinesis. There is moderate left ventricular hypertrophy of the septal segment. Left ventricular diastolic parameters are consistent with Grade I diastolic dysfunction (impaired relaxation).  2. Right ventricular systolic function is normal. The right ventricular size is normal.  3. The mitral valve is normal in structure. Trivial mitral valve regurgitation. No evidence of mitral stenosis.  4. The aortic valve is normal in structure. Aortic valve regurgitation is mild. No aortic stenosis is present.  5. The inferior vena cava is normal in size with greater than 50% respiratory variability, suggesting right atrial pressure of 3 mmHg. Conclusion(s)/Recommendation(s): No intracardiac source of embolism detected on this transthoracic study. Consider a transesophageal echocardiogram to exclude cardiac source of embolism if clinically indicated. FINDINGS   Left Ventricle: Left ventricular ejection fraction, by estimation, is 45 to 50%. The left ventricle has mildly decreased function. The left ventricle demonstrates global hypokinesis. The left ventricular internal cavity size was normal in size. There is  moderate left ventricular hypertrophy of the septal segment. Left ventricular diastolic parameters are consistent with Grade I diastolic dysfunction (impaired relaxation). Right Ventricle: The right ventricular size is normal. No increase in right ventricular wall thickness. Right ventricular systolic function is normal. Left Atrium: Left atrial size was normal in size. Right Atrium: Right atrial size was normal in size. Pericardium: There is no evidence of pericardial effusion. Mitral Valve: The mitral valve is normal in structure. Trivial mitral valve regurgitation. No evidence of mitral valve stenosis. Tricuspid Valve: The tricuspid valve is normal in structure. Tricuspid valve regurgitation is trivial. No evidence of tricuspid stenosis. Aortic Valve: The aortic valve is normal in structure. Aortic valve regurgitation is mild. Aortic regurgitation PHT measures 399 msec. No aortic stenosis is present. Aortic valve mean gradient measures 4.0 mmHg. Aortic valve peak gradient measures 8.2 mmHg. Aortic valve area, by VTI measures 3.28 cm. Pulmonic Valve: The pulmonic valve was normal in structure. Pulmonic valve regurgitation is not visualized. No evidence of pulmonic stenosis. Aorta: The aortic root is normal in size and structure. Venous: The inferior vena cava is normal in size with greater than 50% respiratory variability, suggesting right atrial pressure of 3 mmHg. IAS/Shunts: No atrial level shunt detected by color flow Doppler.  LEFT VENTRICLE PLAX 2D LVIDd:         5.40 cm      Diastology LVIDs:         4.50 cm      LV e' medial:    5.33 cm/s LV PW:         1.20 cm      LV E/e' medial:  11.4 LV IVS:        1.50 cm      LV e' lateral:   9.90 cm/s LVOT diam:      2.50 cm      LV E/e' lateral: 6.1 LV SV:         105 LV SV Index:   51 LVOT Area:     4.91 cm  LV Volumes (MOD) LV vol d, MOD A2C: 130.0 ml LV vol d, MOD A4C: 197.0 ml LV vol s, MOD A2C: 65.2 ml LV vol s, MOD A4C: 96.5 ml LV SV MOD A2C:     64.8 ml LV SV MOD A4C:     197.0 ml LV SV MOD BP:      89.6 ml RIGHT VENTRICLE RV Basal diam:  4.00  cm RV Mid diam:    3.40 cm RV S prime:     10.20 cm/s TAPSE (M-mode): 2.1 cm LEFT ATRIUM             Index        RIGHT ATRIUM           Index LA diam:        3.30 cm 1.61 cm/m   RA Area:     14.20 cm LA Vol (A2C):   58.8 ml 28.69 ml/m  RA Volume:   30.40 ml  14.83 ml/m LA Vol (A4C):   49.5 ml 24.15 ml/m LA Biplane Vol: 56.4 ml 27.52 ml/m  AORTIC VALVE AV Area (Vmax):    3.10 cm AV Area (Vmean):   3.14 cm AV Area (VTI):     3.28 cm AV Vmax:           143.00 cm/s AV Vmean:          97.000 cm/s AV VTI:            0.319 m AV Peak Grad:      8.2 mmHg AV Mean Grad:      4.0 mmHg LVOT Vmax:         90.30 cm/s LVOT Vmean:        62.000 cm/s LVOT VTI:          0.213 m LVOT/AV VTI ratio: 0.67 AI PHT:            399 msec  AORTA Ao Root diam: 3.70 cm Ao Asc diam:  3.10 cm MITRAL VALVE MV Area (PHT): 2.11 cm    SHUNTS MV Decel Time: 359 msec    Systemic VTI:  0.21 m MV E velocity: 60.80 cm/s  Systemic Diam: 2.50 cm MV A velocity: 72.20 cm/s MV E/A ratio:  0.84 Candee Furbish MD Electronically signed by Candee Furbish MD Signature Date/Time: 04/24/2022/1:20:42 PM    Final    EEG adult  Result Date: 04/24/2022 Lora Havens, MD     04/24/2022 11:36 AM Patient Name: [name] [name] MRN: [id number] Epilepsy Attending: Lora Havens Referring Physician/Provider: Derek Jack, MD Date: 04/24/2022 Duration: 27.21 mins Patient history: 62yo M with ams. EEG to evaluate for seizure Level of alertness: Awake, asleep AEDs during EEG study: None Technical aspects: This EEG study was done with scalp electrodes positioned according to the 10-20 International system of electrode placement.  Electrical activity was reviewed with band pass filter of 1-'70Hz'$ , sensitivity of 7 uV/mm, display speed of 53m/sec with a '60Hz'$  notched filter applied as appropriate. EEG data were recorded continuously and digitally stored.  Video monitoring was available and reviewed as appropriate. Description: The posterior dominant rhythm consists of 9 Hz activity of moderate voltage (25-35 uV) seen predominantly in posterior head regions, symmetric and reactive to eye opening and eye closing. Sleep was characterized by vertex waves, sleep spindles (12 to 14 Hz), maximal frontocentral region. EEG showed intermittent generalized 3 to 6 Hz theta-delta slowing. Hyperventilation and photic stimulation were not performed.   ABNORMALITY - Intermittent slow, generalized IMPRESSION: This study is suggestive of mild diffuse encephalopathy, nonspecific etiology. No seizures or epileptiform discharges were seen throughout the recording. Priyanka OBarbra Sarks  CT ANGIO HEAD NECK W WO CM W PERF (CODE STROKE)  Result Date: 04/23/2022 CLINICAL DATA:  Neuro deficit, acute, stroke suspected aphasia without motor deficits EXAM: CT ANGIOGRAPHY HEAD AND NECK CT PERFUSION BRAIN TECHNIQUE: Multidetector CT imaging of the head and neck  was performed using the standard protocol during bolus administration of intravenous contrast. Multiplanar CT image reconstructions and MIPs were obtained to evaluate the vascular anatomy. Carotid stenosis measurements (when applicable) are obtained utilizing NASCET criteria, using the distal internal carotid diameter as the denominator. Multiphase CT imaging of the brain was performed following IV bolus contrast injection. Subsequent parametric perfusion maps were calculated using RAPID software. RADIATION DOSE REDUCTION: This exam was performed according to the departmental dose-optimization program which includes automated exposure control, adjustment of the mA and/or kV according to patient size and/or use of  iterative reconstruction technique. CONTRAST:  1656m OMNIPAQUE IOHEXOL 350 MG/ML SOLN COMPARISON:  None Available. FINDINGS: CTA NECK FINDINGS Aortic arch: Great vessel origins are patent. Right carotid system: Calcific atherosclerosis at the carotid bifurcation without greater than 50% stenosis. Left carotid system: No evidence of dissection, stenosis (50% or greater), or occlusion. Vertebral arteries: Left dominant. No evidence of dissection, stenosis (50% or greater), or occlusion. Skeleton: Multilevel degenerative change. Other neck: No acute findings on limited assessment. Upper chest: Expiratory changes in the lung apices. Review of the MIP images confirms the above findings CTA HEAD FINDINGS Anterior circulation: Bilateral intracranial ICAs, MCAs, and ACAs are patent without proximal hemodynamically significant stenosis. Posterior circulation: Small/non dominant right vertebral artery. Bilateral vertebral arteries, basilar artery and bilateral posterior cerebral arteries are patent without proximal hemodynamically significant stenosis. Venous sinuses: Poorly evaluated due to arterial timing. CT Brain Perfusion Findings: ASPECTS: 10 CBF (<30%) Volume: 070mPerfusion (Tmax>6.0s) volume: 56m34mismatch Volume: 56mL61m the left cerebellum Infarction Location:No core infarct identified. IMPRESSION: 1. No emergent large vessel occlusion or proximal hemodynamically significant stenosis. 2. CT perfusion reports a small (5 mL) area of penumbra in the left cerebellum, which is of unclear significance. An MRI could provide more sensitive evaluation for acute infarct if clinically warranted. Electronically Signed   By: FredMargaretha Sheffield.   On: 04/23/2022 19:39   CT HEAD CODE STROKE WO CONTRAST  Result Date: 04/23/2022 CLINICAL DATA:  Code stroke.  Neuro deficit, acute, stroke suspected EXAM: CT HEAD WITHOUT CONTRAST TECHNIQUE: Contiguous axial images were obtained from the base of the skull through the vertex without  intravenous contrast. RADIATION DOSE REDUCTION: This exam was performed according to the departmental dose-optimization program which includes automated exposure control, adjustment of the mA and/or kV according to patient size and/or use of iterative reconstruction technique. COMPARISON:  CT head 10/19/2014. FINDINGS: Brain: No evidence of acute large vascular territory infarction, hemorrhage, hydrocephalus, extra-axial collection or mass lesion/mass effect. Vascular: No hyperdense vessel. Skull: No acute fracture. Sinuses/Orbits: Clear sinuses.  No acute orbital findings. Other: No mastoid effusions. ASPECTS ([facility] Early CT Score) total score (0-10 with 10 being normal): 10. IMPRESSION: 1. No evidence of acute intracranial abnormality. 2. ASPECTS is 10. Code stroke imaging results were communicated on 04/23/2022 at 7:19 pm to provider [facility] secure text paging. Electronically Signed   By: FredMargaretha Sheffield.   On: 04/23/2022 19:20    Pending Labs Unresulted Labs (From admission, onward)     Start     Ordered   04/24/22 0836  Hepatitis B surface antibody,quantitative  (New Admission [facility] (Hepatitis B))  Once,   R        04/24/22 0838   04/24/22 0755  Haptoglobin  Once,   R        04/24/22 0756   04/24/22 0704  Culture, blood (Routine X 2) w Reflex to ID Panel  BLOOD CULTURE  X 2,   R      04/24/22 0703   Signed and Held  Renal function panel  Once,   R        Signed and Held   Signed and Held  CBC  Once,   R        Signed and Held   Pending  Hepatitis B surface antibody,quantitative  (New Admission [facility] (Hepatitis B))  ONCE - URGENT,   R        Pending   Pending  Hepatitis B surface antigen  (New Admission [facility] (Hepatitis B))  ONCE - URGENT,   R        Pending            Vitals/Pain Today's Vitals   04/24/22 0300 04/24/22 0400 04/24/22 0500 04/24/22 0600  BP: (!) 164/81 (!) 171/90 (!) 142/79 (!) 154/84  Pulse: 91 (!) 105 89 98  Resp: '15 19 15  19  '$ SpO2: 99% 99% 96% 99%  Weight:      Height:        Isolation Precautions No active isolations  Medications Medications  aspirin chewable tablet 81 mg (81 mg Oral Given 04/24/22 0910)    Or  aspirin suppository 300 mg ( Rectal See Alternative 04/24/22 0910)   stroke: early stages of recovery book (has no administration in time range)  acetaminophen (TYLENOL) tablet 650 mg (has no administration in time range)    Or  acetaminophen (TYLENOL) 160 MG/5ML solution 650 mg (has no administration in time range)    Or  acetaminophen (TYLENOL) suppository 650 mg (has no administration in time range)  senna-docusate (Senokot-S) tablet 1 tablet (has no administration in time range)  heparin injection 5,000 Units (5,000 Units Subcutaneous Given 04/24/22 0655)  LORazepam (ATIVAN) injection 2 mg (has no administration in time range)  rosuvastatin (CRESTOR) tablet 5 mg (5 mg Oral Given 04/24/22 0910)  sevelamer carbonate (RENVELA) tablet 800 mg (800 mg Oral Not Given 04/24/22 1318)  venlafaxine XR (EFFEXOR-XR) 24 hr capsule 150 mg (150 mg Oral Given 04/24/22 0907)  cinacalcet (SENSIPAR) tablet 30 mg (has no administration in time range)  cefTRIAXone (ROCEPHIN) 1 g in sodium chloride 0.9 % 100 mL IVPB (0 g Intravenous Stopped 04/24/22 1018)  Chlorhexidine Gluconate Cloth 2 % PADS 6 each (6 each Topical Not Given 04/24/22 0907)  metoprolol tartrate (LOPRESSOR) tablet 25 mg (25 mg Oral Given 04/24/22 0910)  hydrALAZINE (APRESOLINE) injection 10 mg (has no administration in time range)  LORazepam (ATIVAN) 2 MG/ML injection (2 mg  Given 04/23/22 1837)  LORazepam (ATIVAN) 2 MG/ML injection (2 mg  Given 04/23/22 1840)  LORazepam (ATIVAN) 2 MG/ML injection (2 mg  Given 04/23/22 1900)  LORazepam (ATIVAN) 2 MG/ML injection (2 mg  Given 04/23/22 1847)  ketamine (KETALAR) injection 265 mg (265 mg Intramuscular Given 04/23/22 1901)  iohexol (OMNIPAQUE) 350 MG/ML injection 100 mL (100 mLs Intravenous Contrast Given  04/23/22 1924)  LORazepam (ATIVAN) injection 1 mg (1 mg Intravenous Given 04/24/22 0035)    Mobility walks     Focused Assessments Renal Assessment Handoff:  Hemodialysis Schedule: Hemodialysis Schedule: Monday/Wednesday/Friday Last Hemodialysis date and time: Friday 19 Jan 20204   Restricted appendage: left arm   R Recommendations: See Admitting Provider Note  Report given to:   Additional Notes:  Arrived with AMS and a bit combative, much better now Aox4, Had partial dialysis Friday, will have it sometime today. Lethargic but can ambulate, wife in room,

## 2022-04-24 NOTE — Plan of Care (Signed)
Neurology plan of care  Per Dr. Keturah Barre note patient's encephalopathy is significantly improved and he is almost back to baseline. EEG showed mild diffuse slowing without epileptiform abnl. It would be reasonable to get MRI brain wo contrast, or if penile implant is found not being MRI compatible then repeat head CT may be performed at 24 hours.  If imaging is unremarkable recommend no further inpatient neurologic workup and discontinuation of aspirin.  We will follow-up on imaging and provide final guidance.  Su Monks, MD [facility] [phone]  If 7pm- 7am, please page neurology on call as listed in [place].

## 2022-04-24 NOTE — Evaluation (Signed)
Physical Therapy Evaluation Patient Details Name: [name] [name] MRN: [id number] DOB: 1960-05-06 Today's Date: 04/24/2022  History of Present Illness  62 yo male with onset of aphasia during HD was brought to hosp on 1/19, now being considered for stroke.  Pt was combative, now lethargic. Permissive HTN for now.  PMHx: smoker, L nephrostomy, lumbar laminectomy, TURP, HD, hyperparathyroidism, GERD, gout, HTN  Clinical Impression  Pt was seen for progression of movement from bed to sit to walk to balance testing.  Pt is very unsteady and falling to the R side, but when PT tried to offer direct balance assistance would shake off her support and finally set the walker aside.  Reported to his family after the walk.  Clear issues of safety awareness and judgment, along with concerns for safety at home.  May consider HHPT to initiate mobility with use of RW at home providing 1) that the family can support safety with 24/7 direct supervision and 2) that pt has a fitted RW at home.  Had equipment from his mother living there and may not have one that is tall enough to use for him.  Reinforced with pt that he is a high fall risk, and that he needs assistance and walking device for safety.  If these requests cannot be met for supervision would default to having pt go to rehab placement.     Recommendations for follow up therapy are one component of a multi-disciplinary discharge planning process, led by the attending physician.  Recommendations may be updated based on patient status, additional functional criteria and insurance authorization.  Follow Up Recommendations Home health PT      Assistance Recommended at Discharge Frequent or constant Supervision/Assistance  Patient can return home with the following  A little help with walking and/or transfers;A little help with bathing/dressing/bathroom;Assistance with cooking/housework;Direct supervision/assist for medications management;Direct  supervision/assist for financial management;Assist for transportation;Help with stairs or ramp for entrance    Equipment Recommendations Rolling walker (2 wheels);Cane  Recommendations for Other Services       Functional Status Assessment Patient has had a recent decline in their functional status and demonstrates the ability to make significant improvements in function in a reasonable and predictable amount of time.     Precautions / Restrictions Precautions Precautions: Fall Precaution Comments: aphasic Restrictions Weight Bearing Restrictions: No      Mobility  Bed Mobility Overal bed mobility: Needs Assistance Bed Mobility: Supine to Sit, Sit to Supine     Supine to sit: Min assist Sit to supine: Min assist   General bed mobility comments: assist to support trunk and scoot to side of bed as well as return to bed    Transfers Overall transfer level: Needs assistance Equipment used: 1 person hand held assist Transfers: Sit to/from Stand Sit to Stand: Min assist           General transfer comment: LOB to R side with initial standing    Ambulation/Gait Ambulation/Gait assistance: Min guard, Min assist Gait Distance (Feet): 100 Feet Assistive device: Rolling walker (2 wheels), 1 person hand held assist Gait Pattern/deviations: Step-to pattern, Step-through pattern, Staggering right, Wide base of support, Decreased stride length Gait velocity: reduced Gait velocity interpretation: <1.31 ft/sec, indicative of household ambulator Pre-gait activities: standing balance ck General Gait Details: pt is losing balance to R side with gait as he is tending to shake PT off from directly assisting him, set walker aside in hallway and clearly not taking in his fall risk  Stairs  Wheelchair Mobility    Modified Rankin (Stroke Patients Only) Modified Rankin (Stroke Patients Only) Pre-Morbid Rankin Score: No symptoms Modified Rankin: Moderate disability      Balance Overall balance assessment: Needs assistance Sitting-balance support: Feet supported Sitting balance-Leahy Scale: Fair     Standing balance support: Single extremity supported Standing balance-Leahy Scale: Fair Standing balance comment: less than fair dynamically             High level balance activites: Backward walking, Sudden stops, Direction changes, Other (comment), Turns (pt immediately loses balance with high level berg test items)               Pertinent Vitals/Pain Pain Assessment Pain Assessment: Faces Faces Pain Scale: No hurt    Home Living Family/patient expects to be discharged to:: Unsure                        Prior Function Prior Level of Function : Independent/Modified Independent             Mobility Comments: no AD needed, but has equipment from pt's mother living in the home       Hand Dominance   Dominant Hand: Right    Extremity/Trunk Assessment   Upper Extremity Assessment Upper Extremity Assessment: Defer to OT evaluation    Lower Extremity Assessment Lower Extremity Assessment: RLE deficits/detail RLE Deficits / Details: R hip and leg 4 to 4+, L hip 4+ RLE Coordination: decreased gross motor    Cervical / Trunk Assessment Cervical / Trunk Assessment: Back Surgery (history of laminectomy)  Communication   Communication: Expressive difficulties (fairly clear speech but very minimal quantity)  Cognition Arousal/Alertness: Lethargic Behavior During Therapy: Flat affect, Impulsive Overall Cognitive Status: Difficult to assess                                 General Comments: pt was making unsafe mobility choices but family in visiting did not indicate if this is different        General Comments General comments (skin integrity, edema, etc.): pt is unsteady and losing balance to R side as well as generally dismissive of the concerns.  Talked with pt and his daughter who was present, who  assured PT that pt would have continual help.    Exercises     Assessment/Plan    PT Assessment Patient needs continued PT services  PT Problem List Decreased strength;Decreased activity tolerance;Decreased balance;Decreased mobility;Decreased coordination;Decreased cognition;Decreased knowledge of use of DME;Decreased safety awareness       PT Treatment Interventions DME instruction;Gait training;Stair training;Functional mobility training;Therapeutic activities;Therapeutic exercise;Balance training;Neuromuscular re-education;Patient/family education    PT Goals (Current goals can be found in the Care Plan section)  Acute Rehab PT Goals Patient Stated Goal: none stated PT Goal Formulation: With patient/family Time For Goal Achievement: 05/07/22 Potential to Achieve Goals: Good    Frequency Min 3X/week     Co-evaluation               AM-PAC PT "6 Clicks" Mobility  Outcome Measure Help needed turning from your back to your side while in a flat bed without using bedrails?: A Little Help needed moving from lying on your back to sitting on the side of a flat bed without using bedrails?: A Little Help needed moving to and from a bed to a chair (including a wheelchair)?: A Little Help needed standing up from a chair  using your arms (e.g., wheelchair or bedside chair)?: A Little Help needed to walk in hospital room?: A Little Help needed climbing 3-5 steps with a railing? : A Lot 6 Click Score: 17    End of Session Equipment Utilized During Treatment: Gait belt Activity Tolerance: Treatment limited secondary to medical complications (Comment) (unsteady and unconcerned with pt attempting to stop PT from assisting him) Patient left: in bed;with call bell/phone within reach;with family/visitor present Nurse Communication: Mobility status PT Visit Diagnosis: Unsteadiness on feet (R26.81);Muscle weakness (generalized) (M62.81);Difficulty in walking, not elsewhere classified  (R26.2);Ataxic gait (R26.0)    Time: 4656-8127 PT Time Calculation (min) (ACUTE ONLY): 16 min   Charges:   PT Evaluation $PT Eval Moderate Complexity: 1 Mod         Ramond Dial 04/24/2022, 1:28 PM  Mee Hives, PT PhD Acute Rehab Dept. Number: [place] [facility] [place]

## 2022-04-24 NOTE — ED Notes (Signed)
Pt still presenting very lethargic. Pt will respond when name is called; unable to understand what pt says when speaking.

## 2022-04-24 NOTE — Procedures (Signed)
Patient Name: Tavone Caesar  MRN: [id number]  Epilepsy Attending: Lora Havens  Referring Physician/Provider: Derek Jack, MD  Date: 04/24/2022  Duration: 27.21 mins  Patient history: 62yo M with ams. EEG to evaluate for seizure  Level of alertness: Awake, asleep  AEDs during EEG study: None  Technical aspects: This EEG study was done with scalp electrodes positioned according to the 10-20 International system of electrode placement. Electrical activity was reviewed with band pass filter of 1-'70Hz'$ , sensitivity of 7 uV/mm, display speed of 44m/sec with a '60Hz'$  notched filter applied as appropriate. EEG data were recorded continuously and digitally stored.  Video monitoring was available and reviewed as appropriate.  Description: The posterior dominant rhythm consists of 9 Hz activity of moderate voltage (25-35 uV) seen predominantly in posterior head regions, symmetric and reactive to eye opening and eye closing. Sleep was characterized by vertex waves, sleep spindles (12 to 14 Hz), maximal frontocentral region. EEG showed intermittent generalized 3 to 6 Hz theta-delta slowing. Hyperventilation and photic stimulation were not performed.     ABNORMALITY - Intermittent slow, generalized  IMPRESSION: This study is suggestive of mild diffuse encephalopathy, nonspecific etiology. No seizures or epileptiform discharges were seen throughout the recording.  Rawlins Stuard OBarbra Sarks

## 2022-04-24 NOTE — Progress Notes (Signed)
EEG complete - results pending 

## 2022-04-24 NOTE — ED Notes (Signed)
Attempted NIH and GCS assessment on pt; but was unsuccessful. Pt oriented to self but unable to follow commands.

## 2022-04-24 NOTE — ED Notes (Signed)
Provider made aware, unable to perform MRi

## 2022-04-24 NOTE — ED Notes (Signed)
Walked patient to the bathroom patient did well patient is now back in bed with family at bedside

## 2022-04-24 NOTE — Progress Notes (Signed)
PROGRESS NOTE                                                                                                                                                                                                             Patient Demographics:    [name] [name], is a 62 y.o. male, DOB - Jun 02, 1960, LDJ:[id number]  Outpatient Primary MD for the patient is No primary care provider on file.    LOS - 1  Admit date - 04/23/2022    Chief Complaint  Patient presents with   Altered Mental Status       Brief Narrative (HPI from H&P)   62 year old male with past medical history of bladder cancer, bladder replaced [patient makes urine], patient with recurrent and kidney prolapse status post nephrectomy, currently ESRD, penile implant, hypertension, secondary hyperparathyroidism. Per family patient was okay when he went to hemodialysis today. During hemodialysis his blood pressure became elevated, he had nausea vomiting and became aphasic. He was transferred to [facility]. He was transferred over as a code stroke.    Subjective:    [name] today has, No headache, No chest pain, No abdominal pain - No Nausea, No new weakness tingling or numbness, no SOB   Assessment  & Plan :   1 episode of hypertension, confusion with expressive aphasia while undergoing HD in the outpatient setting. seen by neurology, CT CTA ruled out any major stroke, currently mental status improved blood pressure improved and he is close to baseline.  Case discussed with neurology they will monitor.  Workup for neurological issues deferred to neurology who are on the case, he also has a penile implant which precludes MRI for now compatibility being checked.    ESRD.  On MWF schedule, last dialysis not entirely completed, nephrology on case.  Hypertension.  Blood pressure on the higher side low-dose metoprolol and monitor.  Dyslipidemia.  On  statin.  Depression.  On Effexor.       Condition - Extremely Guarded  Family Communication  :  wife bedside 04/24/22  Code Status :  Full  Consults  :  Renal, Neuro  PUD Prophylaxis :    Procedures  :     CT - 1. No emergent large vessel occlusion or proximal hemodynamically significant stenosis. 2. CT perfusion reports a small (5 mL) area of penumbra  in the left cerebellum, which is of unclear significance. An MRI could provide more sensitive evaluation for acute infarct if clinically warranted      Disposition Plan  :    Status is: Inpatient  DVT Prophylaxis  :    heparin injection 5,000 Units Start: 04/23/22 2345 SCD's Start: 04/23/22 2340   Lab Results  Component Value Date   PLT 256 04/24/2022    Diet :  Diet Order             Diet NPO time specified  Diet effective now                    Inpatient Medications  Scheduled Meds:   stroke: early stages of recovery book   Does not apply Once   aspirin  81 mg Oral Daily   Or   aspirin  300 mg Rectal Daily   [START ON 04/26/2022] cinacalcet  30 mg Oral Q M,W,F   heparin  5,000 Units Subcutaneous Q8H   rosuvastatin  5 mg Oral Daily   sevelamer carbonate  800 mg Oral TID WC   venlafaxine XR  150 mg Oral Q breakfast   Continuous Infusions:  cefTRIAXone (ROCEPHIN)  IV     PRN Meds:.acetaminophen **OR** acetaminophen (TYLENOL) oral liquid 160 mg/5 mL **OR** acetaminophen, hydrALAZINE, LORazepam, metoprolol tartrate, senna-docusate  Antibiotics  :    Anti-infectives (From admission, onward)    Start     Dose/Rate Route Frequency Ordered Stop   04/24/22 0800  cefTRIAXone (ROCEPHIN) 1 g in sodium chloride 0.9 % 100 mL IVPB        1 g 200 mL/hr over 30 Minutes Intravenous Every 24 hours 04/24/22 0701           Objective:   Vitals:   04/24/22 0300 04/24/22 0400 04/24/22 0500 04/24/22 0600  BP: (!) 164/81 (!) 171/90 (!) 142/79 (!) 154/84  Pulse: 91 (!) 105 89 98  Resp: '15 19 15 19  '$ SpO2: 99%  99% 96% 99%  Weight:      Height:        Wt Readings from Last 3 Encounters:  04/23/22 89 kg    No intake or output data in the 24 hours ending 04/24/22 0836   Physical Exam  Awake Alert, No new F.N deficits, Normal affect [place].AT,PERRAL Supple Neck, No JVD,   Symmetrical Chest wall movement, Good air movement bilaterally, CTAB RRR,No Gallops,Rubs or new Murmurs,  +ve B.Sounds, Abd Soft, No tenderness,   No Cyanosis, Clubbing or edema        Data Review:    Recent Labs  Lab 04/23/22 1943 04/24/22 0015 04/24/22 0525  WBC 7.8 15.1* 15.1*  HGB 11.6* 11.3* 11.2*  HCT 32.7* 32.8* 33.1*  PLT 217 252 256  MCV 87.0 86.5 88.0  MCH 30.9 29.8 29.8  MCHC 35.5 34.5 33.8  RDW 14.1 14.1 14.3  LYMPHSABS 0.7  --   --   MONOABS 0.2  --   --   EOSABS 0.0  --   --   BASOSABS 0.0  --   --     Recent Labs  Lab 04/23/22 1943 04/24/22 0015 04/24/22 0525  NA 131*  --  133*  K 4.1  --  5.4*  CL 94*  --  96*  CO2 21*  --  22  ANIONGAP 16*  --  15  GLUCOSE 202*  --  112*  BUN 24*  --  30*  CREATININE 4.97* 5.52* 5.79*  AST 27  --  22  ALT 23  --  26  ALKPHOS 63  --  61  BILITOT 0.5  --  0.4  ALBUMIN 3.9  --  3.8  INR 0.9  --   --   HGBA1C 7.7*  --   --   CALCIUM 9.7  --  9.6      Recent Labs  Lab 04/23/22 1943 04/24/22 0525  INR 0.9  --   HGBA1C 7.7*  --   CALCIUM 9.7 9.6    Recent Labs  Lab 04/23/22 1943 04/24/22 0015 04/24/22 0525  WBC 7.8 15.1* 15.1*  PLT 217 252 256  CREATININE 4.97* 5.52* 5.79*    ------------------------------------------------------------------------------------------------------------------ Recent Labs    04/24/22 0525  CHOL 215*  HDL 38*  LDLCALC NOT CALCULATED  TRIG 422*  CHOLHDL 5.7    Lab Results  Component Value Date   HGBA1C 7.7 (H) 04/23/2022    Radiology Reports CT ANGIO HEAD NECK W WO CM W PERF (CODE STROKE)  Result Date: 04/23/2022 CLINICAL DATA:  Neuro deficit, acute, stroke suspected aphasia without  motor deficits EXAM: CT ANGIOGRAPHY HEAD AND NECK CT PERFUSION BRAIN TECHNIQUE: Multidetector CT imaging of the head and neck was performed using the standard protocol during bolus administration of intravenous contrast. Multiplanar CT image reconstructions and MIPs were obtained to evaluate the vascular anatomy. Carotid stenosis measurements (when applicable) are obtained utilizing NASCET criteria, using the distal internal carotid diameter as the denominator. Multiphase CT imaging of the brain was performed following IV bolus contrast injection. Subsequent parametric perfusion maps were calculated using RAPID software. RADIATION DOSE REDUCTION: This exam was performed according to the departmental dose-optimization program which includes automated exposure control, adjustment of the mA and/or kV according to patient size and/or use of iterative reconstruction technique. CONTRAST:  1179m OMNIPAQUE IOHEXOL 350 MG/ML SOLN COMPARISON:  None Available. FINDINGS: CTA NECK FINDINGS Aortic arch: Great vessel origins are patent. Right carotid system: Calcific atherosclerosis at the carotid bifurcation without greater than 50% stenosis. Left carotid system: No evidence of dissection, stenosis (50% or greater), or occlusion. Vertebral arteries: Left dominant. No evidence of dissection, stenosis (50% or greater), or occlusion. Skeleton: Multilevel degenerative change. Other neck: No acute findings on limited assessment. Upper chest: Expiratory changes in the lung apices. Review of the MIP images confirms the above findings CTA HEAD FINDINGS Anterior circulation: Bilateral intracranial ICAs, MCAs, and ACAs are patent without proximal hemodynamically significant stenosis. Posterior circulation: Small/non dominant right vertebral artery. Bilateral vertebral arteries, basilar artery and bilateral posterior cerebral arteries are patent without proximal hemodynamically significant stenosis. Venous sinuses: Poorly evaluated due to  arterial timing. CT Brain Perfusion Findings: ASPECTS: 10 CBF (<30%) Volume: 048mPerfusion (Tmax>6.0s) volume: 79m56mismatch Volume: 79mL51m the left cerebellum Infarction Location:No core infarct identified. IMPRESSION: 1. No emergent large vessel occlusion or proximal hemodynamically significant stenosis. 2. CT perfusion reports a small (5 mL) area of penumbra in the left cerebellum, which is of unclear significance. An MRI could provide more sensitive evaluation for acute infarct if clinically warranted. Electronically Signed   By: FredMargaretha Sheffield.   On: 04/23/2022 19:39   CT HEAD CODE STROKE WO CONTRAST  Result Date: 04/23/2022 CLINICAL DATA:  Code stroke.  Neuro deficit, acute, stroke suspected EXAM: CT HEAD WITHOUT CONTRAST TECHNIQUE: Contiguous axial images were obtained from the base of the skull through the vertex without intravenous contrast. RADIATION DOSE REDUCTION: This exam was performed according to the departmental dose-optimization program which includes automated  exposure control, adjustment of the mA and/or kV according to patient size and/or use of iterative reconstruction technique. COMPARISON:  CT head 10/19/2014. FINDINGS: Brain: No evidence of acute large vascular territory infarction, hemorrhage, hydrocephalus, extra-axial collection or mass lesion/mass effect. Vascular: No hyperdense vessel. Skull: No acute fracture. Sinuses/Orbits: Clear sinuses.  No acute orbital findings. Other: No mastoid effusions. ASPECTS [facility] Stroke Program Early CT Score) total score (0-10 with 10 being normal): 10. IMPRESSION: 1. No evidence of acute intracranial abnormality. 2. ASPECTS is 10. Code stroke imaging results were communicated on 04/23/2022 at 7:19 pm to provider [facility] via secure text paging. Electronically Signed   By: Margaretha Sheffield M.D.   On: 04/23/2022 19:20      Signature  -   Lala Lund M.D on 04/24/2022 at 8:36 AM   -  To page go to [email or web address]

## 2022-04-25 DIAGNOSIS — R4701 Aphasia: Secondary | ICD-10-CM | POA: Diagnosis not present

## 2022-04-25 LAB — HAPTOGLOBIN: Haptoglobin: 224 mg/dL (ref 32–363)

## 2022-04-25 MED ORDER — METFORMIN HCL 500 MG PO TABS: 500.0000 mg | ORAL_TABLET | Freq: Two times a day (BID) | ORAL | 0 refills | Status: DC

## 2022-04-25 MED ORDER — ASPIRIN 81 MG PO CHEW: 81.0000 mg | CHEWABLE_TABLET | Freq: Every day | ORAL | 0 refills | Status: AC

## 2022-04-25 MED ORDER — ROSUVASTATIN CALCIUM 10 MG PO TABS: 10.0000 mg | ORAL_TABLET | Freq: Every day | ORAL | 0 refills | Status: DC

## 2022-04-25 MED ORDER — FENOFIBRATE 160 MG PO TABS: 160.0000 mg | ORAL_TABLET | Freq: Every day | ORAL | Status: DC

## 2022-04-25 NOTE — Progress Notes (Signed)
[facility] Progress Note   Assessment/ Plan:   Dialysis Orders:  MF - NW  4hrs, BFR 400, DFR AF 1.5,  EDW 89.4kg, 2K/ 3.5Ca P2   Access: LU AVF  Heparin 5000 unit bolus, 2500 unit intermittent Calcitriol 58mg PO qHD   Assessment/Plan:  AMS - Hypertension, severe HA, confusion and aphasia - Back to baseline.  Began at OP HD.  Neurology following, CTA r/o major stroke.  Checking EEG.  Tox report negative.  WBC elevated.  BC ordered.  Checking LDH, haptoglobin to r/o hemolytic anemia--> does not appear to be hemolyzing. Per HD unit patient using same machine today showed similar symptoms.  Checking machine for potential contaminants. Completed regular testing/disinfection on schedule.  Hyperkalemia - K 5.4, sp short rx 1/20.  Next HD Monday  ESRD -  on HD MF. Extra HD today as noted above, see how patient tolerates treatment.   Hypertension/volume  - BP elevated.  Does not appear volume overloaded.  UF as tolerated.   Anemia of CKD - Hgb 11.2. Not on ESA.  Secondary Hyperparathyroidism -  Ca in goal. Check phos.  Continue VDRA.  Nutrition - Renal diet w/fluids restrictions when eating.  Dispo: for discharge today- this is fine.  Will need to follow up blood cultures as OP  Subjective:    Seen in room.  Feeling better.  Dialysis went fine yesterday.     Objective:   BP (!) 148/72   Pulse 90   Temp 98.5 F (36.9 C)   Resp 16   Ht '5\' 9"'$  (1.753 m)   Wt 91.8 kg   SpO2 100%   BMI 29.89 kg/m   Physical Exam: General: WDWN male NAD Lungs: CTA bilaterally. No wheeze, rales or rhonchi. Breathing is unlabored. Heart: RRR. No murmur, rubs or gallops.  Abdomen: soft, nontender, +BS, no guarding, no rebound tenderness  Lower extremities:no edema, ischemic changes, or open wounds  Dialysis Access: LU AVF +b/t  Labs: BMET Recent Labs  Lab 04/23/22 1943 04/24/22 0015 04/24/22 0525  NA 131*  --  133*  K 4.1  --  5.4*  CL 94*  --  96*  CO2 21*  --  22  GLUCOSE 202*   --  112*  BUN 24*  --  30*  CREATININE 4.97* 5.52* 5.79*  CALCIUM 9.7  --  9.6   CBC Recent Labs  Lab 04/23/22 1943 04/24/22 0015 04/24/22 0525  WBC 7.8 15.1* 15.1*  NEUTROABS 6.8  --   --   HGB 11.6* 11.3* 11.2*  HCT 32.7* 32.8* 33.1*  MCV 87.0 86.5 88.0  PLT 217 252 256      Medications:     aspirin  81 mg Oral Daily   Or   aspirin  300 mg Rectal Daily   Chlorhexidine Gluconate Cloth  6 each Topical Q0600   [START ON 04/26/2022] cinacalcet  30 mg Oral Q M,W,F   heparin  5,000 Units Subcutaneous Q8H   metoprolol tartrate  25 mg Oral BID   sevelamer carbonate  800 mg Oral TID WC   venlafaxine XR  150 mg Oral Q breakfast     EMadelon Lips MD 04/25/2022, 10:55 AM

## 2022-04-25 NOTE — Plan of Care (Signed)
  Problem: Education: Goal: Knowledge of disease or condition will improve Outcome: Adequate for Discharge Goal: Knowledge of secondary prevention will improve (MUST DOCUMENT ALL) Outcome: Adequate for Discharge Goal: Knowledge of patient specific risk factors will improve Elta [place] N/A or DELETE if not current risk factor) Outcome: Adequate for Discharge   Problem: Ischemic Stroke/TIA Tissue Perfusion: Goal: Complications of ischemic stroke/TIA will be minimized Outcome: Adequate for Discharge   Problem: Coping: Goal: Will verbalize positive feelings about self Outcome: Adequate for Discharge Goal: Will identify appropriate support needs Outcome: Adequate for Discharge   Problem: Health Behavior/Discharge Planning: Goal: Ability to manage health-related needs will improve Outcome: Adequate for Discharge Goal: Goals will be collaboratively established with patient/family Outcome: Adequate for Discharge   Problem: Self-Care: Goal: Ability to participate in self-care as condition permits will improve Outcome: Adequate for Discharge Goal: Verbalization of feelings and concerns over difficulty with self-care will improve Outcome: Adequate for Discharge Goal: Ability to communicate needs accurately will improve Outcome: Adequate for Discharge   Problem: Nutrition: Goal: Risk of aspiration will decrease Outcome: Adequate for Discharge Goal: Dietary intake will improve Outcome: Adequate for Discharge   Problem: Education: Goal: Knowledge of General Education information will improve Description: Including pain rating scale, medication(s)/side effects and non-pharmacologic comfort measures Outcome: Adequate for Discharge   Problem: Health Behavior/Discharge Planning: Goal: Ability to manage health-related needs will improve Outcome: Adequate for Discharge   Problem: Clinical Measurements: Goal: Ability to maintain clinical measurements within normal limits will improve Outcome:  Adequate for Discharge Goal: Will remain free from infection Outcome: Adequate for Discharge Goal: Diagnostic test results will improve Outcome: Adequate for Discharge Goal: Respiratory complications will improve Outcome: Adequate for Discharge Goal: Cardiovascular complication will be avoided Outcome: Adequate for Discharge   Problem: Activity: Goal: Risk for activity intolerance will decrease Outcome: Adequate for Discharge   Problem: Nutrition: Goal: Adequate nutrition will be maintained Outcome: Adequate for Discharge   Problem: Coping: Goal: Level of anxiety will decrease Outcome: Adequate for Discharge   Problem: Elimination: Goal: Will not experience complications related to bowel motility Outcome: Adequate for Discharge Goal: Will not experience complications related to urinary retention Outcome: Adequate for Discharge   Problem: Pain Managment: Goal: General experience of comfort will improve Outcome: Adequate for Discharge   Problem: Safety: Goal: Ability to remain free from injury will improve Outcome: Adequate for Discharge   Problem: Skin Integrity: Goal: Risk for impaired skin integrity will decrease Outcome: Adequate for Discharge

## 2022-04-25 NOTE — Plan of Care (Signed)
Neurology plan of care  MRI brain wo contrast unremarkable. OK to d/c aspirin. No further inpatient neurologic workup indicated. Neurology will be available for questions going forward.  Su Monks, MD [facility] [phone]  If 7pm- 7am, please page neurology on call as listed in [place].

## 2022-04-25 NOTE — Progress Notes (Signed)
Pt just came from HD, still alert oriented, not in distress, with total UF 1.5 L as reported by HD nurse

## 2022-04-25 NOTE — Discharge Instructions (Addendum)
You need close outpatient follow-up with cardiology, nephrology.  Also please review all your blood work and discharge paperwork with your primary care physician within a week.    Follow with Primary MD No primary care provider on file. in 7 days   Get CBC, CMP, 2 view Chest X ray -  checked next visit with your primary MD   Activity: As tolerated with Full fall precautions use walker/cane & assistance as needed  Disposition Home   Diet: Renal diet with 1.5 L fluid restriction  Special Instructions: If you have smoked or chewed Tobacco  in the last 2 yrs please stop smoking, stop any regular Alcohol  and or any Recreational drug use.  On your next visit with your primary care physician please Get Medicines reviewed and adjusted.  Please request your Prim.MD to go over all Hospital Tests and Procedure/Radiological results at the follow up, please get all Hospital records sent to your Prim MD by signing hospital release before you go home.  If you experience worsening of your admission symptoms, develop shortness of breath, life threatening emergency, suicidal or homicidal thoughts you must seek medical attention immediately by calling 911 or calling your MD immediately  if symptoms less severe.  You Must read complete instructions/literature along with all the possible adverse reactions/side effects for all the Medicines you take and that have been prescribed to you. Take any new Medicines after you have completely understood and accpet all the possible adverse reactions/side effects.

## 2022-04-25 NOTE — Evaluation (Signed)
Occupational Therapy Evaluation Patient Details Name: [name] [name] MRN: [id number] DOB: 07-Dec-1960 Today's Date: 04/25/2022   History of Present Illness 62 yo male with onset of aphasia during HD was brought to hosp on 1/19, now being considered for stroke.  Pt was combative, now lethargic. Permissive HTN for now.  PMHx: smoker, L nephrostomy, lumbar laminectomy, TURP, HD, hyperparathyroidism, GERD, gout, HTN   Clinical Impression   PTA, pt was independent in ADL and IADL and lived with his family. Upon eval, pt continues to present with mild speech difficulties, unsteadiness, and minimally decreased strength on the R. Pt performing UB ADL with mod I and ADL with supervision. Pt performing tub transfer with 4 step path finding during session with supervision and no cues. Pt performing short blessed test with score of 4 indicating nearly normal cognition. Will follow acutely to continue to assess executive function skills, however, pt and wife reporting he is at baseline and pt unlikely to require continued OT services.     Recommendations for follow up therapy are one component of a multi-disciplinary discharge planning process, led by the attending physician.  Recommendations may be updated based on patient status, additional functional criteria and insurance authorization.   Follow Up Recommendations  No OT follow up     Assistance Recommended at Discharge Intermittent Supervision/Assistance  Patient can return home with the following A little help with walking and/or transfers;A little help with bathing/dressing/bathroom;Assistance with cooking/housework;Help with stairs or ramp for entrance    Functional Status Assessment  Patient has had a recent decline in their functional status and demonstrates the ability to make significant improvements in function in a reasonable and predictable amount of time.  Equipment Recommendations  None recommended by OT    Recommendations for Other  Services       Precautions / Restrictions Precautions Precautions: Fall Precaution Comments: aphasic Restrictions Weight Bearing Restrictions: No      Mobility Bed Mobility Overal bed mobility: Needs Assistance, Modified Independent             General bed mobility comments: min increased time    Transfers Overall transfer level: Needs assistance Equipment used: None Transfers: Sit to/from Stand Sit to Stand: Supervision           General transfer comment: no LOB noted      Balance Overall balance assessment: Needs assistance Sitting-balance support: Feet supported Sitting balance-Leahy Scale: Fair     Standing balance support: Single extremity supported Standing balance-Leahy Scale: Fair Standing balance comment: supervision for dynamic balance                           ADL either performed or assessed with clinical judgement   ADL Overall ADL's : Needs assistance/impaired Eating/Feeding: Independent   Grooming: Supervision/safety   Upper Body Bathing: Modified independent   Lower Body Bathing: Supervison/ safety   Upper Body Dressing : Modified independent   Lower Body Dressing: Supervision/safety   Toilet Transfer: Supervision/safety;Ambulation   Toileting- Clothing Manipulation and Hygiene: Modified independent   Tub/ Shower Transfer: Tub transfer;Supervision/safety;Ambulation Tub/Shower Transfer Details (indicate cue type and reason): supervision for safety Functional mobility during ADLs: Supervision/safety       Vision Baseline Vision/History: 0 No visual deficits Ability to See in Adequate Light: 0 Adequate Patient Visual Report: No change from baseline Vision Assessment?: No apparent visual deficits Additional Comments: no apparent deficits; able to locate needed ADL items during session     Perception  Praxis      Pertinent Vitals/Pain Pain Assessment Pain Assessment: No/denies pain     Hand Dominance  Left   Extremity/Trunk Assessment Upper Extremity Assessment Upper Extremity Assessment: Generalized weakness;RUE deficits/detail RUE Deficits / Details: slightly weaker than L, but L handed and able to use BUE functionally. Did report to pt that weakness was present and pt without concern; not interested in OP OT for strengthening   Lower Extremity Assessment Lower Extremity Assessment: Defer to PT evaluation   Cervical / Trunk Assessment Cervical / Trunk Assessment: Back Surgery (history of laminectomy)   Communication Communication Communication: No difficulties (no difficulty, however, only verbalizing when asked)   Cognition Arousal/Alertness: Awake/alert Behavior During Therapy: WFL for tasks assessed/performed, Flat affect Overall Cognitive Status: Within Functional Limits for tasks assessed                                 General Comments: passing SBT with a score of 4 indicating nearly normal cognitive abilities for basic life tasks. Mild memoryt deficits to recall a name and address, however, able to follow 4+ step commands, count backward, state months of year in reverse order. Slightly slower performance of cognitve tasks and processing, but wife reporting he seems near baseline to herEducated to reach out to MD with any difficutly.     General Comments  pt family available 24/7    Exercises     Shoulder Instructions      Home Living Family/patient expects to be discharged to:: Private residence Living Arrangements: Spouse/significant other;Children (daughter and son 63 and 100) Available Help at Discharge: Family;Available 24 hours/day Type of Home: House Home Access: Stairs to enter [employer] of Steps: 3 Entrance Stairs-Rails: Right Home Layout: Two level;Able to live on main level with bedroom/bathroom     Bathroom Shower/Tub: Teacher, early years/pre: Handicapped height     Home Equipment: Curator  (4 wheels)          Prior Functioning/Environment Prior Level of Function : Independent/Modified Independent             Mobility Comments: no AD ADLs Comments: indep in ADL and IADL        OT Problem List: Decreased strength;Decreased activity tolerance;Impaired balance (sitting and/or standing);Decreased cognition;Decreased safety awareness      OT Treatment/Interventions: Self-care/ADL training;Therapeutic exercise;DME and/or AE instruction;Balance training;Patient/family education;Cognitive remediation/compensation;Therapeutic activities    OT Goals(Current goals can be found in the care plan section) Acute Rehab OT Goals Patient Stated Goal: go home OT Goal Formulation: With patient/family Time For Goal Achievement: 05/09/22 Potential to Achieve Goals: Good  OT Frequency: Min 2X/week    Co-evaluation              AM-PAC OT "6 Clicks" Daily Activity     Outcome Measure Help from another person eating meals?: None Help from another person taking care of personal grooming?: A Little Help from another person toileting, which includes using toliet, bedpan, or urinal?: A Little Help from another person bathing (including washing, rinsing, drying)?: A Little Help from another person to put on and taking off regular upper body clothing?: None Help from another person to put on and taking off regular lower body clothing?: A Little 6 Click Score: 20   End of Session Equipment Utilized During Treatment: Gait belt Nurse Communication: Mobility status  Activity Tolerance: Patient tolerated treatment well Patient left: in bed;with call bell/phone within reach;with  bed alarm set;with family/visitor present  OT Visit Diagnosis: Unsteadiness on feet (R26.81);Muscle weakness (generalized) (M62.81);Other symptoms and signs involving cognitive function                Time: 0539-7673 OT Time Calculation (min): 19 min Charges:  OT General Charges $OT Visit: 1 Visit OT  Evaluation $OT Eval Low Complexity: 1 Low  Elder Cyphers, OTR/L [facility] Acute Rehabilitation Office: [phone]   Magnus Ivan 04/25/2022, 10:42 AM

## 2022-04-25 NOTE — Discharge Summary (Addendum)
[name] [name] MVH:[id number] DOB: Mar 16, 1961 DOA: 04/23/2022  PCP: No primary care provider on file.  Admit date: 04/23/2022  Discharge date: 04/25/2022  Admitted From: Home   Disposition:  Home   Recommendations for Outpatient Follow-up:   Follow up with PCP in 1-2 weeks  PCP Please obtain BMP/CBC, 2 view CXR in 1week,  (see Discharge instructions)   PCP Please follow up on the following pending results: Monitor secondary risk factors for TIA/ CVA   Home Health: None   Equipment/Devices: None  Consultations: Neuro, Renal Discharge Condition: Stable    CODE STATUS: Full    Diet Recommendation: Renal with 1.5 L fluid restriction    Chief Complaint  Patient presents with   Altered Mental Status     Brief history of present illness from the day of admission and additional interim summary    62 year old male with past medical history of bladder cancer, bladder replaced [patient makes urine], patient with recurrent and kidney prolapse status post nephrectomy, currently ESRD, penile implant, hypertension, secondary hyperparathyroidism. Per family patient was okay when he went to hemodialysis today. During hemodialysis his blood pressure became elevated, he had nausea vomiting and became aphasic. He was transferred to [facility] ED. He was transferred over as a code stroke.                                                                   Hospital Course   1 episode of hypertension, confusion with expressive aphasia while undergoing HD in the outpatient setting. seen by neurology, CT CTA ruled out any major stroke, currently mental status improved blood pressure improved and he is close to his baseline seen by neurology underwent stroke workup which included MRI of the brain which was unremarkable, CT CTA head  nonacute, echocardiogram nonacute but did show some changes as below.  He is completely symptom-free Case discussed with neurologist Dr. Quinn Axe on 04/25/2022 no further neurology recommendations.  Will place him on aspirin and statin for secondary prevention question if he had a mild TIA.  Patient could have also had mild nausea caused by constipation as he says he was slightly constipated and this could have caused his blood pressure to be high.  Apparently he was sent from the dialysis unit to the ER after he complained of nausea and blood pressure was noted to be high.  There is question if he got confused and had some speech abnormality for a few minutes but this is unconfirmed.  At any rate he is back to his baseline MRI brain, CTA head and neck, EEG all unremarkable.     ESRD.  On MWF schedule, last dialysis not entirely completed, nephrology on case.   Hypertension.  Blood pressure will resume home regimen.   Dyslipidemia.  Based on statin.  Depression.  On Effexor.  New diagnosis of EF 45 to 50% suggesting mild chronic systolic heart failure however echo also shows global hypokinesis.  Patient denies any previous history of major cardiac issues, he is already on beta-blocker aspirin and statin for secondary prevention, chest pain-free, will recommend close outpatient cardiology workup and follow-up in the next 1 to 2 weeks to be arranged by PCP.  DM type II.  New diagnosis.  Placed on Glucophage.  Follow-up with PCP.  Lab Results  Component Value Date   HGBA1C 7.7 (H) 04/23/2022     Discharge diagnosis     Principal Problem:   Aphasia Active Problems:   ESRD (end stage renal disease) ([place])   Bladder cancer (H[facility])   Anxiety and depression   Gout    Discharge instructions    Discharge Instructions     Discharge instructions   Complete by: As directed    You need close outpatient follow-up with cardiology, nephrology.  Also please review all your blood work and discharge  paperwork with your primary care physician within a week.    Follow with Primary MD No primary care provider on file. in 7 days   Get CBC, CMP, 2 view Chest X ray -  checked next visit with your primary MD   Activity: As tolerated with Full fall precautions use walker/cane & assistance as needed  Disposition Home   Diet: Renal diet with 1.5 L fluid restriction  Special Instructions: If you have smoked or chewed Tobacco  in the last 2 yrs please stop smoking, stop any regular Alcohol  and or any Recreational drug use.  On your next visit with your primary care physician please Get Medicines reviewed and adjusted.  Please request your Prim.MD to go over all Hospital Tests and Procedure/Radiological results at the follow up, please get all Hospital records sent to your Prim MD by signing hospital release before you go home.  If you experience worsening of your admission symptoms, develop shortness of breath, life threatening emergency, suicidal or homicidal thoughts you must seek medical attention immediately by calling 911 or calling your MD immediately  if symptoms less severe.  You Must read complete instructions/literature along with all the possible adverse reactions/side effects for all the Medicines you take and that have been prescribed to you. Take any new Medicines after you have completely understood and accpet all the possible adverse reactions/side effects.       Discharge Medications   Allergies as of 04/25/2022       Reactions   Lisinopril Cough        Medication List     TAKE these medications    amLODipine 10 MG tablet Commonly known as: NORVASC Take 10 mg by mouth daily.   aspirin 81 MG chewable tablet Chew 1 tablet (81 mg total) by mouth daily. Start taking on: April 26, 2022   Calcium Acetate 667 MG Tabs Take 1,334 mg by mouth with breakfast, with lunch, and with evening meal.   metFORMIN 500 MG tablet Commonly known as: GLUCOPHAGE Take 1 tablet  (500 mg total) by mouth 2 (two) times daily with a meal.   metoprolol tartrate 25 MG tablet Commonly known as: LOPRESSOR Take 25 mg by mouth at bedtime.   pantoprazole 40 MG tablet Commonly known as: PROTONIX Take 80 mg by mouth daily.   rosuvastatin 10 MG tablet Commonly known as: Crestor Take 1 tablet (10 mg total) by mouth daily.   sevelamer carbonate 800 MG tablet  Commonly known as: RENVELA Take 1,600 mg by mouth 3 (three) times daily with meals.   venlafaxine XR 150 MG 24 hr capsule Commonly known as: EFFEXOR-XR Take 150 mg by mouth daily with breakfast.         Follow-up Information     PCP. Schedule an appointment as soon as possible for a visit in 1 week(s).          Adrian Prows, MD. Schedule an appointment as soon as possible for a visit in 1 week(s).   Specialty: Cardiology Why: EF 45% further cardiac workup Contact information: [address] [place] [address] [phone]                 Major procedures and Radiology Reports - PLEASE review detailed and final reports thoroughly  -        MR BRAIN WO CONTRAST  Result Date: 04/24/2022 CLINICAL DATA:  Transient ischemic attack (TIA) tia EXAM: MRI HEAD WITHOUT CONTRAST TECHNIQUE: Multiplanar, multiecho pulse sequences of the brain and surrounding structures were obtained without intravenous contrast. COMPARISON:  CT head 04/23/2022 FINDINGS: Brain: No acute infarction, hemorrhage, hydrocephalus, extra-axial collection or mass lesion. Vascular: Major arterial flow voids are maintained at the skull base. Skull and upper cervical spine: Normal marrow signal. Sinuses/Orbits: Clear sinuses.  No acute orbital findings. Other: No mastoid effusions. IMPRESSION: No evidence of acute intracranial abnormality. Electronically Signed   By: Margaretha Sheffield M.D.   On: 04/24/2022 19:17   ECHOCARDIOGRAM COMPLETE  Result Date: 04/24/2022    ECHOCARDIOGRAM REPORT   Patient Name:   [name] [name] Date  of Exam: 04/24/2022 Medical Rec #:  [id number]       Height:       69.0 in Accession #:    [id number]      Weight:       196.2 lb Date of Birth:  Jan 27, 1961        BSA:          2.049 m Patient Age:    62 years        BP:           154/84 mmHg Patient Gender: M               HR:           76 bpm. Exam Location:  Inpatient Procedure: 2D Echo, Cardiac Doppler and Color Doppler Indications:    TIA  History:        Patient has no prior history of Echocardiogram examinations.                 Risk Factors:Diabetes and Hypertension. ESRD.  Sonographer:    Clayton Lefort RDCS (AE) Referring Phys: [facility]  Sonographer Comments: Suboptimal subcostal window. IMPRESSIONS  1. Left ventricular ejection fraction, by estimation, is 45 to 50%. The left ventricle has mildly decreased function. The left ventricle demonstrates global hypokinesis. There is moderate left ventricular hypertrophy of the septal segment. Left ventricular diastolic parameters are consistent with Grade I diastolic dysfunction (impaired relaxation).  2. Right ventricular systolic function is normal. The right ventricular size is normal.  3. The mitral valve is normal in structure. Trivial mitral valve regurgitation. No evidence of mitral stenosis.  4. The aortic valve is normal in structure. Aortic valve regurgitation is mild. No aortic stenosis is present.  5. The inferior vena cava is normal in size with greater than 50% respiratory variability, suggesting right atrial pressure of 3 mmHg. Conclusion(s)/Recommendation(s):  No intracardiac source of embolism detected on this transthoracic study. Consider a transesophageal echocardiogram to exclude cardiac source of embolism if clinically indicated. FINDINGS  Left Ventricle: Left ventricular ejection fraction, by estimation, is 45 to 50%. The left ventricle has mildly decreased function. The left ventricle demonstrates global hypokinesis. The left ventricular internal cavity size was normal in size. There is   moderate left ventricular hypertrophy of the septal segment. Left ventricular diastolic parameters are consistent with Grade I diastolic dysfunction (impaired relaxation). Right Ventricle: The right ventricular size is normal. No increase in right ventricular wall thickness. Right ventricular systolic function is normal. Left Atrium: Left atrial size was normal in size. Right Atrium: Right atrial size was normal in size. Pericardium: There is no evidence of pericardial effusion. Mitral Valve: The mitral valve is normal in structure. Trivial mitral valve regurgitation. No evidence of mitral valve stenosis. Tricuspid Valve: The tricuspid valve is normal in structure. Tricuspid valve regurgitation is trivial. No evidence of tricuspid stenosis. Aortic Valve: The aortic valve is normal in structure. Aortic valve regurgitation is mild. Aortic regurgitation PHT measures 399 msec. No aortic stenosis is present. Aortic valve mean gradient measures 4.0 mmHg. Aortic valve peak gradient measures 8.2 mmHg. Aortic valve area, by VTI measures 3.28 cm. Pulmonic Valve: The pulmonic valve was normal in structure. Pulmonic valve regurgitation is not visualized. No evidence of pulmonic stenosis. Aorta: The aortic root is normal in size and structure. Venous: The inferior vena cava is normal in size with greater than 50% respiratory variability, suggesting right atrial pressure of 3 mmHg. IAS/Shunts: No atrial level shunt detected by color flow Doppler.  LEFT VENTRICLE PLAX 2D LVIDd:         5.40 cm      Diastology LVIDs:         4.50 cm      LV e' medial:    5.33 cm/s LV PW:         1.20 cm      LV E/e' medial:  11.4 LV IVS:        1.50 cm      LV e' lateral:   9.90 cm/s LVOT diam:     2.50 cm      LV E/e' lateral: 6.1 LV SV:         105 LV SV Index:   51 LVOT Area:     4.91 cm  LV Volumes (MOD) LV vol d, MOD A2C: 130.0 ml LV vol d, MOD A4C: 197.0 ml LV vol s, MOD A2C: 65.2 ml LV vol s, MOD A4C: 96.5 ml LV SV MOD A2C:     64.8 ml LV  SV MOD A4C:     197.0 ml LV SV MOD BP:      89.6 ml RIGHT VENTRICLE RV Basal diam:  4.00 cm RV Mid diam:    3.40 cm RV S prime:     10.20 cm/s TAPSE (M-mode): 2.1 cm LEFT ATRIUM             Index        RIGHT ATRIUM           Index LA diam:        3.30 cm 1.61 cm/m   RA Area:     14.20 cm LA Vol (A2C):   58.8 ml 28.69 ml/m  RA Volume:   30.40 ml  14.83 ml/m LA Vol (A4C):   49.5 ml 24.15 ml/m LA Biplane Vol: 56.4 ml 27.52 ml/m  AORTIC  VALVE AV Area (Vmax):    3.10 cm AV Area (Vmean):   3.14 cm AV Area (VTI):     3.28 cm AV Vmax:           143.00 cm/s AV Vmean:          97.000 cm/s AV VTI:            0.319 m AV Peak Grad:      8.2 mmHg AV Mean Grad:      4.0 mmHg LVOT Vmax:         90.30 cm/s LVOT Vmean:        62.000 cm/s LVOT VTI:          0.213 m LVOT/AV VTI ratio: 0.67 AI PHT:            399 msec  AORTA Ao Root diam: 3.70 cm Ao Asc diam:  3.10 cm MITRAL VALVE MV Area (PHT): 2.11 cm    SHUNTS MV Decel Time: 359 msec    Systemic VTI:  0.21 m MV E velocity: 60.80 cm/s  Systemic Diam: 2.50 cm MV A velocity: 72.20 cm/s MV E/A ratio:  0.84 Candee Furbish MD Electronically signed by Candee Furbish MD Signature Date/Time: 04/24/2022/1:20:42 PM    Final    EEG adult  Result Date: 04/24/2022 Lora Havens, MD     04/24/2022 11:36 AM Patient Name: [name] [name] MRN: [id number] Epilepsy Attending: Lora Havens Referring Physician/Provider: Derek Jack, MD Date: 04/24/2022 Duration: 27.21 mins Patient history: 62yo M with ams. EEG to evaluate for seizure Level of alertness: Awake, asleep AEDs during EEG study: None Technical aspects: This EEG study was done with scalp electrodes positioned according to the 10-20 International system of electrode placement. Electrical activity was reviewed with band pass filter of 1-'70Hz'$ , sensitivity of 7 uV/mm, display speed of 70m/sec with a '60Hz'$  notched filter applied as appropriate. EEG data were recorded continuously and digitally stored.  Video monitoring was available  and reviewed as appropriate. Description: The posterior dominant rhythm consists of 9 Hz activity of moderate voltage (25-35 uV) seen predominantly in posterior head regions, symmetric and reactive to eye opening and eye closing. Sleep was characterized by vertex waves, sleep spindles (12 to 14 Hz), maximal frontocentral region. EEG showed intermittent generalized 3 to 6 Hz theta-delta slowing. Hyperventilation and photic stimulation were not performed.   ABNORMALITY - Intermittent slow, generalized IMPRESSION: This study is suggestive of mild diffuse encephalopathy, nonspecific etiology. No seizures or epileptiform discharges were seen throughout the recording. Priyanka OBarbra Sarks  CT ANGIO HEAD NECK W WO CM W PERF (CODE STROKE)  Result Date: 04/23/2022 CLINICAL DATA:  Neuro deficit, acute, stroke suspected aphasia without motor deficits EXAM: CT ANGIOGRAPHY HEAD AND NECK CT PERFUSION BRAIN TECHNIQUE: Multidetector CT imaging of the head and neck was performed using the standard protocol during bolus administration of intravenous contrast. Multiplanar CT image reconstructions and MIPs were obtained to evaluate the vascular anatomy. Carotid stenosis measurements (when applicable) are obtained utilizing NASCET criteria, using the distal internal carotid diameter as the denominator. Multiphase CT imaging of the brain was performed following IV bolus contrast injection. Subsequent parametric perfusion maps were calculated using RAPID software. RADIATION DOSE REDUCTION: This exam was performed according to the departmental dose-optimization program which includes automated exposure control, adjustment of the mA and/or kV according to patient size and/or use of iterative reconstruction technique. CONTRAST:  1081mOMNIPAQUE IOHEXOL 350 MG/ML SOLN COMPARISON:  None Available. FINDINGS: CTA NECK FINDINGS Aortic  arch: Great vessel origins are patent. Right carotid system: Calcific atherosclerosis at the carotid bifurcation  without greater than 50% stenosis. Left carotid system: No evidence of dissection, stenosis (50% or greater), or occlusion. Vertebral arteries: Left dominant. No evidence of dissection, stenosis (50% or greater), or occlusion. Skeleton: Multilevel degenerative change. Other neck: No acute findings on limited assessment. Upper chest: Expiratory changes in the lung apices. Review of the MIP images confirms the above findings CTA HEAD FINDINGS Anterior circulation: Bilateral intracranial ICAs, MCAs, and ACAs are patent without proximal hemodynamically significant stenosis. Posterior circulation: Small/non dominant right vertebral artery. Bilateral vertebral arteries, basilar artery and bilateral posterior cerebral arteries are patent without proximal hemodynamically significant stenosis. Venous sinuses: Poorly evaluated due to arterial timing. CT Brain Perfusion Findings: ASPECTS: 10 CBF (<30%) Volume: 68m Perfusion (Tmax>6.0s) volume: 543mMismatch Volume: 29m48mn the left cerebellum Infarction Location:No core infarct identified. IMPRESSION: 1. No emergent large vessel occlusion or proximal hemodynamically significant stenosis. 2. CT perfusion reports a small (5 mL) area of penumbra in the left cerebellum, which is of unclear significance. An MRI could provide more sensitive evaluation for acute infarct if clinically warranted. Electronically Signed   By: FreMargaretha SheffieldD.   On: 04/23/2022 19:39   CT HEAD CODE STROKE WO CONTRAST  Result Date: 04/23/2022 CLINICAL DATA:  Code stroke.  Neuro deficit, acute, stroke suspected EXAM: CT HEAD WITHOUT CONTRAST TECHNIQUE: Contiguous axial images were obtained from the base of the skull through the vertex without intravenous contrast. RADIATION DOSE REDUCTION: This exam was performed according to the departmental dose-optimization program which includes automated exposure control, adjustment of the mA and/or kV according to patient size and/or use of iterative  reconstruction technique. COMPARISON:  CT head 10/19/2014. FINDINGS: Brain: No evidence of acute large vascular territory infarction, hemorrhage, hydrocephalus, extra-axial collection or mass lesion/mass effect. Vascular: No hyperdense vessel. Skull: No acute fracture. Sinuses/Orbits: Clear sinuses.  No acute orbital findings. Other: No mastoid effusions. ASPECTS ([facility] Program Early CT Score) total score (0-10 with 10 being normal): 10. IMPRESSION: 1. No evidence of acute intracranial abnormality. 2. ASPECTS is 10. Code stroke imaging results were communicated on 04/23/2022 at 7:19 pm to provider [facility] secure text paging. Electronically Signed   By: FreMargaretha SheffieldD.   On: 04/23/2022 19:20    Micro Results    Recent Results (from the past 240 hour(s))  Resp panel by RT-PCR (RSV, Flu A&B, Covid) Anterior Nasal Swab     Status: None   Collection Time: 04/23/22  7:43 PM   Specimen: Anterior Nasal Swab  Result Value Ref Range Status   SARS Coronavirus 2 by RT PCR NEGATIVE NEGATIVE Final    Comment: (NOTE) SARS-CoV-2 target nucleic acids are NOT DETECTED.  The SARS-CoV-2 RNA is generally detectable in upper respiratory specimens during the acute phase of infection. The lowest concentration of SARS-CoV-2 viral copies this assay can detect is 138 copies/mL. A negative result does not preclude SARS-Cov-2 infection and should not be used as the sole basis for treatment or other patient management decisions. A negative result may occur with  improper specimen collection/handling, submission of specimen other than nasopharyngeal swab, presence of viral mutation(s) within the areas targeted by this assay, and inadequate number of viral copies(<138 copies/mL). A negative result must be combined with clinical observations, patient history, and epidemiological information. The expected result is Negative.  Fact Sheet for Patients:  httEntrepreneurPulse.com.auact  Sheet for Healthcare Providers:  httIncredibleEmployment.behis test is  no t yet approved or cleared by the [place] and  has been authorized for detection and/or diagnosis of SARS-CoV-2 by FDA under an Emergency Use Authorization (EUA). This EUA will remain  in effect (meaning this test can be used) for the duration of the COVID-19 declaration under Section 564(b)(1) of the Act, 21 U.S.C.section 360bbb-3(b)(1), unless the authorization is terminated  or revoked sooner.       Influenza A by PCR NEGATIVE NEGATIVE Final   Influenza B by PCR NEGATIVE NEGATIVE Final    Comment: (NOTE) The Xpert Xpress SARS-CoV-2/FLU/RSV plus assay is intended as an aid in the diagnosis of influenza from Nasopharyngeal swab specimens and should not be used as a sole basis for treatment. Nasal washings and aspirates are unacceptable for Xpert Xpress SARS-CoV-2/FLU/RSV testing.  Fact Sheet for Patients: EntrepreneurPulse.com.au  Fact Sheet for Healthcare Providers: IncredibleEmployment.be  This test is not yet approved or cleared by the [place] FDA and has been authorized for detection and/or diagnosis of SARS-CoV-2 by FDA under an Emergency Use Authorization (EUA). This EUA will remain in effect (meaning this test can be used) for the duration of the COVID-19 declaration under Section 564(b)(1) of the Act, 21 U.S.C. section 360bbb-3(b)(1), unless the authorization is terminated or revoked.     Resp Syncytial Virus by PCR NEGATIVE NEGATIVE Final    Comment: (NOTE) Fact Sheet for Patients: EntrepreneurPulse.com.au  Fact Sheet for Healthcare Providers: IncredibleEmployment.be  This test is not yet approved or cleared by the [place] FDA and has been authorized for detection and/or diagnosis of SARS-CoV-2 by FDA under an Emergency Use Authorization (EUA). This EUA will remain in effect  (meaning this test can be used) for the duration of the COVID-19 declaration under Section 564(b)(1) of the Act, 21 U.S.C. section 360bbb-3(b)(1), unless the authorization is terminated or revoked.  Performed at [facility], [place] [address], [place], [place] [address]   Culture, blood (Routine X 2) w Reflex to ID Panel     Status: None (Preliminary result)   Collection Time: 04/25/22  3:32 AM   Specimen: BLOOD  Result Value Ref Range Status   Specimen Description BLOOD BLOOD RIGHT ARM  Final   Special Requests   Final    BOTTLES DRAWN AEROBIC AND ANAEROBIC Blood Culture adequate volume   Culture   Final    NO GROWTH < 12 HOURS Performed at [facility], [place] [address], [place], [place] [address]    Report Status PENDING  Incomplete  Culture, blood (Routine X 2) w Reflex to ID Panel     Status: None (Preliminary result)   Collection Time: 04/25/22  3:32 AM   Specimen: BLOOD  Result Value Ref Range Status   Specimen Description BLOOD BLOOD RIGHT ARM  Final   Special Requests   Final    BOTTLES DRAWN AEROBIC AND ANAEROBIC Blood Culture adequate volume   Culture   Final    NO GROWTH < 12 HOURS Performed at [facility], [place] [address] [place], [place] [address]    Report Status PENDING  Incomplete    Today   Subjective    [name] [name] today has no headache,no chest abdominal pain,no new weakness tingling or numbness, feels much better wants to go home today.     Objective   Blood pressure (!) 148/72, pulse 90, temperature 98.5 F (36.9 C), resp. rate 16, height '5\' 9"'$  (1.753 m), weight 91.8 kg, SpO2 100 %.   Intake/Output  Summary (Last 24 hours) at 04/25/2022 1005 Last data filed at 04/25/2022 0909 Gross per 24 hour  Intake 620 ml  Output 1.5 ml  Net 618.5 ml    Exam  Awake Alert, No new F.N deficits,    [place].AT,PERRAL Supple Neck,   Symmetrical Chest wall movement, Good air movement bilaterally, CTAB RRR,No Gallops,   +ve B.Sounds, Abd Soft,  Non tender,  No Cyanosis, Clubbing or edema    Data Review   Recent Labs  Lab 04/23/22 1943 04/24/22 0015 04/24/22 0525  WBC 7.8 15.1* 15.1*  HGB 11.6* 11.3* 11.2*  HCT 32.7* 32.8* 33.1*  PLT 217 252 256  MCV 87.0 86.5 88.0  MCH 30.9 29.8 29.8  MCHC 35.5 34.5 33.8  RDW 14.1 14.1 14.3  LYMPHSABS 0.7  --   --   MONOABS 0.2  --   --   EOSABS 0.0  --   --   BASOSABS 0.0  --   --     Recent Labs  Lab 04/23/22 1943 04/24/22 0015 04/24/22 0525  NA 131*  --  133*  K 4.1  --  5.4*  CL 94*  --  96*  CO2 21*  --  22  ANIONGAP 16*  --  15  GLUCOSE 202*  --  112*  BUN 24*  --  30*  CREATININE 4.97* 5.52* 5.79*  AST 27  --  22  ALT 23  --  26  ALKPHOS 63  --  61  BILITOT 0.5  --  0.4  ALBUMIN 3.9  --  3.8  INR 0.9  --   --   HGBA1C 7.7*  --   --   CALCIUM 9.7  --  9.6     Total Time in preparing paper work, data evaluation and todays exam - 35 minutes  Signature  -    Lala Lund M.D on 04/25/2022 at 10:05 AM   -  To page go to [email or web address]

## 2022-04-25 NOTE — Progress Notes (Signed)
Discharge instructions reviewed with pt and wife.  All questions answered.  Getting dressed so he can go home

## 2022-04-26 ENCOUNTER — Telehealth: Payer: Self-pay | Admitting: Nephrology

## 2022-04-26 ENCOUNTER — Encounter (HOSPITAL_COMMUNITY): Payer: Self-pay

## 2022-04-26 LAB — HEPATITIS B SURFACE ANTIBODY, QUANTITATIVE: Hep B S AB Quant (Post): 14.8 m[IU]/mL (ref >9.9)

## 2022-04-26 NOTE — Telephone Encounter (Signed)
Transition of care contact from inpatient facility  Date of discharge:  04/25/22 Date of contact: 04/26/22 Method: Phone Spoke to: Patient  Patient contacted to discuss transition of care from recent inpatient hospitalization. Patient was admitted to [facility] from 1/19 -04/25/22 with discharge diagnosis of altered mental status   Medication changes were reviewed.  Patient will follow up with his/her outpatient HD unit on: He went to his scheduled dialysis today 1/22.

## 2022-04-30 LAB — CULTURE, BLOOD (ROUTINE X 2)
Culture: NO GROWTH
Culture: NO GROWTH
Special Requests: ADEQUATE
Special Requests: ADEQUATE

## 2022-05-04 ENCOUNTER — Encounter: Payer: Self-pay | Admitting: Cardiology

## 2022-05-04 ENCOUNTER — Ambulatory Visit: Payer: Medicare Other | Admitting: Cardiology

## 2022-05-04 VITALS — BP 133/73 | HR 90 | Resp 16 | Ht 69.0 in | Wt 199.8 lb

## 2022-05-04 DIAGNOSIS — R9431 Abnormal electrocardiogram [ECG] [EKG]: Secondary | ICD-10-CM

## 2022-05-04 DIAGNOSIS — I517 Cardiomegaly: Secondary | ICD-10-CM

## 2022-05-04 DIAGNOSIS — R0989 Other specified symptoms and signs involving the circulatory and respiratory systems: Secondary | ICD-10-CM

## 2022-05-04 DIAGNOSIS — E119 Type 2 diabetes mellitus without complications: Secondary | ICD-10-CM

## 2022-05-04 DIAGNOSIS — I132 Hypertensive heart and chronic kidney disease with heart failure and with stage 5 chronic kidney disease, or end stage renal disease: Secondary | ICD-10-CM

## 2022-05-04 NOTE — Progress Notes (Signed)
Primary Physician/Referring:  Nickola Major, MD  Patient ID: [name] [name], male    DOB: 1961/01/20, 62 y.o.   MRN: [id number]  Chief Complaint  Patient presents with   Hypertension   EF 45%   Hospitalization Follow-up   New Patient (Initial Visit)   HPI:    [name] [name]  is a 62 y.o. Caucasian male patient with hypertension, penile implant, secondary hyperparathyroidism end-stage renal disease on hemodialysis due to bladder cancer SP resection and bladder reconstruction using bowels, eventually led to kidney failure and had to have left nephrectomy and presently on hemodialysis on Monday and Friday,?  Diabetes mellitus, new onset in January 2024 when he was admitted with altered mental status and hypertensive urgency, and heart failure related to end-stage renal disease now referred to [place] for evaluation of abnormal LVEF noted on the echocardiogram and cardiomegaly. Latest hospitalization was on 04/23/2022 with altered mental status and elevated blood pressure.  Latest echocardiogram on 04/24/2022 revealing LVEF 45 to 50%.  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  He remains asymptomatic.  Except for arthritis in his left hip, no dyspnea, no chest pain, no palpitations, no dizziness or syncope.  He quit smoking several years ago.  Past Medical History:  Diagnosis Date   Allergy    Anemia    Anemia in chronic kidney disease (CKD) 08/12/2016   Anxiety    Bladder cancer ([place]) 2013   Blood transfusion without reported diagnosis    Coagulation defect, unspecified ([place]) 24/58/0998   Complication of anesthesia    agitation w/awakening in 9/13,[place] 01/24/12   Depression    Diabetes mellitus type 2, diet-controlled ([place]) PT STOPPED TAKING METFORMIN--  HAS BEEN WATCHING DIET, EXERCISING AND LOSING WT   ESRD (end stage renal disease) ([place]) 08/11/2016   Frequency of urination    GERD  (gastroesophageal reflux disease)    Gout    recent flair -bilateral feet--  STABLE   Grade II diastolic dysfunction    Hyperkalemia 08/12/2016   Hyperlipidemia    Hypertension    Hypertensive kidney disease with CKD (chronic kidney disease)    Nocturia    Non-compliance with renal dialysis    Secondary hyperparathyroidism of renal origin ([place]) 08/12/2016   Urgency incontinence    Past Surgical History:  Procedure Laterality Date   AV FISTULA PLACEMENT Left 05/31/2014   Procedure: LEFT RADIOCEPHALIC ARTERIOVENOUS (AV) FISTULA CREATION ;  Surgeon: Mal Misty, MD;  Location: [facility];  Service: Vascular;  Laterality: Left;   BACK SURGERY     BLADDER SURGERY  04/26/2013   at the [facility] [place] in  [place]     COLONOSCOPY     CYSTOSCOPY W/ RETROGRADES Bilateral 07/10/2012   Procedure: [place];  Surgeon: Molli Hazard, MD;  Location: [facility];  Service: Urology;  Laterality: Bilateral;   CYSTOSCOPY WITH URETHRAL DILATATION  01/24/2012   Procedure: CYSTOSCOPY WITH URETHRAL DILATATION;  Surgeon: Molli Hazard, MD;  Location: [facility];  Service: Urology;  Laterality: N/A;   IR NEPHROSTOMY PLACEMENT LEFT  07/16/2016   LUMBAR LAMINECTOMY  06-02-2005   W/ RESECTION NERVE ROOT  L4  -  L5   NEPHROSTOMY TUBE PLACEMENT ([facility])  2015   right    TRANSURETHRAL RESECTION OF BLADDER TUMOR  12/15/2011   Procedure: TRANSURETHRAL RESECTION OF  BLADDER TUMOR (TURBT);  Surgeon: Molli Hazard, MD;  Location: [facility];  Service: Urology;  Laterality: N/A;  2 HRS    TRANSURETHRAL RESECTION OF BLADDER TUMOR  01/24/2012   Procedure: TRANSURETHRAL RESECTION OF BLADDER TUMOR (TURBT);  Surgeon: Molli Hazard, MD;  Location: [facility];  Service: Urology;  Laterality: N/A;  90 MIN    TRANSURETHRAL RESECTION OF BLADDER TUMOR N/A 07/10/2012   Procedure: TRANSURETHRAL  RESECTION OF BLADDER TUMOR (TURBT);  Surgeon: Molli Hazard, MD;  Location: [facility];  Service: Urology;  Laterality: N/A;   ulner artery repair Right 2009   rt -trauma /thrombectomy,repair   Family History  Problem Relation Age of Onset   Heart disease Mother    Diabetes Maternal Grandmother    Brain cancer Paternal Uncle    Hypertension Other    Bladder Cancer Neg Hx    Colon cancer Neg Hx    Esophageal cancer Neg Hx    Rectal cancer Neg Hx    Stomach cancer Neg Hx     Social History   Tobacco Use   Smoking status: Former    Packs/day: 1.00    Years: 16.00    Total pack years: 16.00    Types: Cigarettes    Quit date: 01/20/1992    Years since quitting: 30.3   Smokeless tobacco: Former    Types: Chew    Quit date: 01/20/1992  Substance Use Topics   Alcohol use: No    Alcohol/week: 0.0 standard drinks of alcohol   Marital Status: Married  ROS  Review of Systems  Cardiovascular:  Negative for chest pain, dyspnea on exertion and leg swelling.   Objective      05/04/2022    9:10 AM 04/25/2022    1:05 PM 04/25/2022    9:01 AM  Vitals with BMI  Height '5\' 9"'$     Weight 199 lbs 13 oz    BMI 19.41    Systolic 740 814 481  Diastolic 73 79 72  Pulse 90 93 90   SpO2: 98 %  Physical Exam Neck:     Vascular: No carotid bruit or JVD.  Cardiovascular:     Rate and Rhythm: Normal rate and regular rhythm.     Pulses: Intact distal pulses.     Heart sounds: Normal heart sounds. No murmur heard.    No gallop.     Comments: Left forearm AV graft for dialysis noted Pulmonary:     Effort: Pulmonary effort is normal.     Breath sounds: Normal breath sounds.  Abdominal:     General: Bowel sounds are normal.     Palpations: Abdomen is soft.  Musculoskeletal:     Right lower leg: No edema.     Left lower leg: No edema.     Medications and allergies   Allergies  Allergen Reactions   Lisinopril Cough   Lisinopril Cough     Medication list    Current Outpatient Medications:    amLODipine (NORVASC) 10 MG tablet, Take 10 mg by mouth at bedtime., Disp: , Rfl:    aspirin 81 MG chewable tablet, Chew 1 tablet (81 mg total) by mouth daily., Disp: 30 tablet, Rfl: 0   calcium acetate (PHOSLO) 667 MG capsule, Take 1,334 mg by mouth at bedtime., Disp: , Rfl:    metoprolol tartrate (LOPRESSOR) 25 MG tablet, Take 25 mg by mouth at bedtime., Disp: , Rfl:    pantoprazole (PROTONIX) 40 MG tablet,  Take 80 mg by mouth daily., Disp: , Rfl:    rosuvastatin (CRESTOR) 10 MG tablet, Take 1 tablet (10 mg total) by mouth daily., Disp: 30 tablet, Rfl: 0   sevelamer carbonate (RENVELA) 800 MG tablet, Take 800-1,600 mg by mouth See admin instructions. 1600 mg with meals, 800 mg with snacks., Disp: , Rfl:    venlafaxine XR (EFFEXOR-XR) 150 MG 24 hr capsule, Take 1 capsule (150 mg total) by mouth daily with breakfast. (Patient taking differently: Take 150 mg by mouth at bedtime.), Disp: 30 capsule, Rfl: 1 Laboratory examination:   Recent Labs    09/29/21 0417 04/23/22 1943 04/24/22 0015 04/24/22 0525  NA 134* 131*  --  133*  K 4.1 4.1  --  5.4*  CL 95* 94*  --  96*  CO2 25 21*  --  22  GLUCOSE 160* 202*  --  112*  BUN 32* 24*  --  30*  CREATININE 6.03* 4.97* 5.52* 5.79*  CALCIUM 8.6* 9.7  --  9.6  GFRNONAA 10* 12* 11* 10*    Lab Results  Component Value Date   GLUCOSE 112 (H) 04/24/2022   NA 133 (L) 04/24/2022   K 5.4 (H) 04/24/2022   CL 96 (L) 04/24/2022   CO2 22 04/24/2022   BUN 30 (H) 04/24/2022   CREATININE 5.79 (H) 04/24/2022   GFRNONAA 10 (L) 04/24/2022   CALCIUM 9.6 04/24/2022   PHOS 7.6 (H) 08/26/2020   PROT 7.1 04/24/2022   ALBUMIN 3.8 04/24/2022   BILITOT 0.4 04/24/2022   ALKPHOS 61 04/24/2022   AST 22 04/24/2022   ALT 26 04/24/2022   ANIONGAP 15 04/24/2022      Lab Results  Component Value Date   ALT 26 04/24/2022   AST 22 04/24/2022   ALKPHOS 61 04/24/2022   BILITOT 0.4 04/24/2022       Latest Ref Rng & Units  04/24/2022    5:25 AM 04/23/2022    7:43 PM 05/10/2020    4:26 AM  Hepatic Function  Total Protein 6.5 - 8.1 g/dL 7.1  7.3  6.2   Albumin 3.5 - 5.0 g/dL 3.8  3.9  2.6   AST 15 - 41 U/L '22  27  18   '$ ALT 0 - 44 U/L '26  23  24   '$ Alk Phosphatase 38 - 126 U/L 61  63  66   Total Bilirubin 0.3 - 1.2 mg/dL 0.4  0.5  0.5     Lipid Panel Recent Labs    04/24/22 0525  CHOL 215*  TRIG 422*  LDLCALC NOT CALCULATED  VLDL UNABLE TO CALCULATE IF TRIGLYCERIDE OVER 400 mg/dL  HDL 38*  CHOLHDL 5.7    HEMOGLOBIN A1C Lab Results  Component Value Date   HGBA1C 7.7 (H) 04/23/2022   MPG 174.29 04/23/2022   Lab Results  Component Value Date   TSH 2.794 11/16/2016    External labs:   NA  Radiology:   CT Brain 04/23/22: 1. No emergent large vessel occlusion or proximal hemodynamically significant stenosis. 2. CT perfusion reports a small (5 mL) area of penumbra in the left cerebellum, which is of unclear significance. An MRI could provide more sensitive evaluation for acute infarct if clinically warranted.  CXR 2 view 09/29/21: Unchanged cardiomegaly. There are mild interstitial opacities and mid to lower lung predominant alveolar opacities. Small pleural effusions, right greater than left. No pneumothorax. Unchanged anterior compression deformity of T11. Thoracic spondylosis.  Cardiac Studies:   ABI 08/26/2016: - Bilateral lower  extremity arterial evaluation demonstrates    triphasic waves throughout and ABI&'s within the normal range    (indicating no significant peripheral arterial disease).  - Left brachial not evaluated due to AVF.   Echocardiogram 04/28/2022:  1. Left ventricular ejection fraction, by estimation, is 45 to 50%. The left ventricle has mildly decreased function. The left ventricle demonstrates global hypokinesis. There is moderate left ventricular hypertrophy of the septal segment. Left  ventricular diastolic parameters are consistent with Grade I diastolic  dysfunction (impaired relaxation).  2. Right ventricular systolic function is normal. The right ventricular size is normal.  3. The mitral valve is normal in structure. Trivial mitral valve regurgitation. No evidence of mitral stenosis.  4. The aortic valve is normal in structure. Aortic valve regurgitation is mild. No aortic stenosis is present.  5. The inferior vena cava is normal in size with greater than 50% respiratory variability, suggesting right atrial pressure of 3 mmHg. 6.  Compared to 09/29/2021, normal LVEF, grade 2 diastolic dysfunction  EKG:   EKG 05/04/2022: Normal sinus rhythm at rate of 86 bpm, nonspecific T abnormality, cannot exclude high lateral ischemia.   EKG 04/26/2022: Normal sinus rhythm at rate of 94 bpm, normal axis, no evidence of ischemia  Assessment     ICD-10-CM   1. Cardiomegaly  I51.7 EKG 12-Lead    PCV MYOCARDIAL PERFUSION WO LEXISCAN    2. Hypertensive heart and chronic kidney disease with heart failure and with stage 5 chronic kidney disease, or end stage renal disease (HCC)  I13.2 PCV MYOCARDIAL PERFUSION WO LEXISCAN    3. Type 2 diabetes mellitus without complication, without long-term current use of insulin (HCC)  E11.9     4. Abnormal EKG  R94.31 PCV MYOCARDIAL PERFUSION WO LEXISCAN    5. Abnormal peripheral pulse  R09.89 PCV ANKLE BRACHIAL INDEX (ABI)       Orders Placed This Encounter  Procedures   PCV MYOCARDIAL PERFUSION WO LEXISCAN    Standing Status:   Future    Standing Expiration Date:   07/03/2022   EKG 12-Lead    No orders of the defined types were placed in this encounter.   Medications Discontinued During This Encounter  Medication Reason   albuterol (VENTOLIN HFA) 108 (90 Base) MCG/ACT inhaler    Calcium Acetate 667 MG TABS    metFORMIN (GLUCOPHAGE) 500 MG tablet    metoprolol tartrate (LOPRESSOR) 25 MG tablet    pantoprazole (PROTONIX) 40 MG tablet    sevelamer carbonate (RENVELA) 800 MG tablet    venlafaxine XR  (EFFEXOR-XR) 150 MG 24 hr capsule    amLODipine (NORVASC) 10 MG tablet Duplicate     Recommendations:   [name] [name] is a 62 y.o. Caucasian male patient with hypertension, penile implant, secondary hyperparathyroidism end-stage renal disease on hemodialysis due to bladder cancer SP resection and bladder reconstruction using bowels, eventually led to kidney failure and had to have left nephrectomy and presently on hemodialysis on Monday and Friday,?  Diabetes mellitus, new onset in January 2024 when he was admitted with altered mental status and hypertensive urgency, and heart failure related to end-stage renal disease now referred to [place] for evaluation of abnormal LVEF noted on the echocardiogram and cardiomegaly. Latest hospitalization was on 04/23/2022 with altered mental status and elevated blood pressure.  Latest echocardiogram on 04/24/2022 revealing LVEF 45 to 50%.  1. Cardiomegaly Patient asymptomatic without angina or heart failure symptoms.  Not sure that his ejection fraction is truly 45% or is related  to hypertension induced cardiomyopathy.  Overall in view of his end-stage renal disease, recent heart failure,?  New onset diabetes, hypercholesterolemia, he will need further cardiac restratification.  I will set him up for exercise nuclear stress test.  2. Hypertensive heart and chronic kidney disease with heart failure and with stage 5 chronic kidney disease, or end stage renal disease ([place]) Patient has moderate LVH on echocardiogram, today the blood pressure is well-controlled, I did not make any changes to his medication.  He is presently on dialysis twice a week, he still makes urine.  3. Type 2 diabetes mellitus without complication, without long-term current use of insulin ([place]) There has been questionable diagnosis of diabetes and A1c was elevated during recent hospitalization, this is being further worked up by his PCP and also nephrology.  4. Abnormal EKG He clearly has an  abnormal EKG suggesting high lateral ischemia that is new from recent hospitalization EKG.  Hence nuclear stress test would be appropriate as well.  5. Abnormal peripheral pulse He has absent pedal pulses however other vascular examination is normal, left AV shunt for dialysis is widely patent.  I will order an ABI.  This also help for further cardiac risk stratification.  I would like to see him back in 6 to 8 weeks for follow-up.    Adrian Prows, MD, [facility] 05/04/2022, 9:44 AM Office: [phone]

## 2022-05-05 ENCOUNTER — Ambulatory Visit: Payer: Medicare Other

## 2022-05-05 DIAGNOSIS — R0989 Other specified symptoms and signs involving the circulatory and respiratory systems: Secondary | ICD-10-CM

## 2022-05-18 ENCOUNTER — Ambulatory Visit: Payer: Medicare Other

## 2022-05-18 DIAGNOSIS — I132 Hypertensive heart and chronic kidney disease with heart failure and with stage 5 chronic kidney disease, or end stage renal disease: Secondary | ICD-10-CM

## 2022-05-18 DIAGNOSIS — I517 Cardiomegaly: Secondary | ICD-10-CM

## 2022-05-18 DIAGNOSIS — R9431 Abnormal electrocardiogram [ECG] [EKG]: Secondary | ICD-10-CM

## 2022-05-24 NOTE — Progress Notes (Signed)
Let him know the stress was abnormal and appears to be due to high BP than blockages and I will further discuss on his visit soon

## 2022-05-25 NOTE — Progress Notes (Signed)
Called no answer left a vm

## 2022-05-26 NOTE — Progress Notes (Signed)
Patient called back, I have discussed results with him.

## 2022-05-26 NOTE — Progress Notes (Signed)
LMTCB

## 2022-06-15 ENCOUNTER — Ambulatory Visit: Payer: Medicare Other | Admitting: Cardiology

## 2022-06-15 NOTE — Progress Notes (Deleted)
Primary Physician/Referring:  Nickola Major, MD  Patient ID: [name] [name], male    DOB: 1960/07/19, 62 y.o.   MRN: [id number]  No chief complaint on file.  HPI:    [name] [name]  is a 62 y.o. Caucasian male patient with hypertension with hypertensive heart disease and mild LV systolic dysfunction, penile implant, secondary hyperparathyroidism, end-stage renal disease on hemodialysis due to bladder cancer SP resection and bladder reconstruction using bowels, left nephrectomy and on hemodialysis on Monday and Friday, Diabetes mellitus, new onset in January 2024 when he was admitted with altered mental status and hypertensive urgency.  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  He remains asymptomatic.  Except for arthritis in his left hip, no dyspnea, no chest pain, no palpitations, no dizziness or syncope.  He quit smoking several years ago.  Past Medical History:  Diagnosis Date   Allergy    Anemia    Anemia in chronic kidney disease (CKD) 08/12/2016   Anxiety    Bladder cancer ([place]) 2013   Blood transfusion without reported diagnosis    Coagulation defect, unspecified ([place]) [id number]   Complication of anesthesia    agitation w/awakening in 9/13,OK 01/24/12   Depression    Diabetes mellitus type 2, diet-controlled ([place]) PT STOPPED TAKING METFORMIN--  HAS BEEN WATCHING DIET, EXERCISING AND LOSING WT   ESRD (end stage renal disease) ([place]) 08/11/2016   Frequency of urination    GERD (gastroesophageal reflux disease)    Gout    recent flair -bilateral feet--  STABLE   Grade II diastolic dysfunction    Hyperkalemia 08/12/2016   Hyperlipidemia    Hypertension    Hypertensive kidney disease with CKD (chronic kidney disease)    Nocturia    Non-compliance with renal dialysis    Secondary hyperparathyroidism of renal origin ([place]) 08/12/2016   Urgency incontinence    Past  Surgical History:  Procedure Laterality Date   AV FISTULA PLACEMENT Left 05/31/2014   Procedure: LEFT RADIOCEPHALIC ARTERIOVENOUS (AV) FISTULA CREATION ;  Surgeon: Mal Misty, MD;  Location: [facility];  Service: Vascular;  Laterality: Left;   BACK SURGERY     BLADDER SURGERY  04/26/2013   at the [facility] [place] in  [place]     COLONOSCOPY     CYSTOSCOPY W/ RETROGRADES Bilateral 07/10/2012   Procedure: [place];  Surgeon: Molli Hazard, MD;  Location: [facility];  Service: Urology;  Laterality: Bilateral;   CYSTOSCOPY WITH URETHRAL DILATATION  01/24/2012   Procedure: CYSTOSCOPY WITH URETHRAL DILATATION;  Surgeon: Molli Hazard, MD;  Location: [facility];  Service: Urology;  Laterality: N/A;   IR NEPHROSTOMY PLACEMENT LEFT  07/16/2016   LUMBAR LAMINECTOMY  06-02-2005   W/ RESECTION NERVE ROOT  L4  -  L5   NEPHROSTOMY TUBE PLACEMENT ([place] [facility])  2015   right    TRANSURETHRAL RESECTION OF BLADDER TUMOR  12/15/2011   Procedure: TRANSURETHRAL RESECTION OF BLADDER TUMOR (TURBT);  Surgeon: Molli Hazard, MD;  Location: [facility];  Service: Urology;  Laterality: N/A;  2 HRS    TRANSURETHRAL RESECTION OF BLADDER TUMOR  01/24/2012   Procedure: TRANSURETHRAL RESECTION OF BLADDER TUMOR (TURBT);  Surgeon: Molli Hazard, MD;  Location: [facility];  Service: Urology;  Laterality: N/A;  90 MIN  TRANSURETHRAL RESECTION OF BLADDER TUMOR N/A 07/10/2012   Procedure: TRANSURETHRAL RESECTION OF BLADDER TUMOR (TURBT);  Surgeon: Molli Hazard, MD;  Location: [facility];  Service: Urology;  Laterality: N/A;   ulner artery repair Right 2009   rt -trauma /thrombectomy,repair   Family History  Problem Relation Age of Onset   Heart disease Mother    Diabetes Maternal Grandmother    Brain cancer Paternal Uncle    Hypertension Other    Bladder Cancer Neg Hx     Colon cancer Neg Hx    Esophageal cancer Neg Hx    Rectal cancer Neg Hx    Stomach cancer Neg Hx     Social History   Tobacco Use   Smoking status: Former    Packs/day: 1.00    Years: 16.00    Total pack years: 16.00    Types: Cigarettes    Quit date: 01/20/1992    Years since quitting: 30.4   Smokeless tobacco: Former    Types: Chew    Quit date: 01/20/1992  Substance Use Topics   Alcohol use: No    Alcohol/week: 0.0 standard drinks of alcohol   Marital Status: Married  ROS  Review of Systems  Cardiovascular:  Negative for chest pain, dyspnea on exertion and leg swelling.   Objective      05/04/2022    9:10 AM 04/25/2022    1:05 PM 04/25/2022    9:01 AM  Vitals with BMI  Height '5\' 9"'$     Weight 199 lbs 13 oz    BMI 99991111    Systolic Q000111Q Q000111Q [id number]  Diastolic 73 79 72  Pulse 90 93 90      Physical Exam Neck:     Vascular: No carotid bruit or JVD.  Cardiovascular:     Rate and Rhythm: Normal rate and regular rhythm.     Pulses: Intact distal pulses.     Heart sounds: Normal heart sounds. No murmur heard.    No gallop.     Comments: Left forearm AV graft for dialysis noted Pulmonary:     Effort: Pulmonary effort is normal.     Breath sounds: Normal breath sounds.  Abdominal:     General: Bowel sounds are normal.     Palpations: Abdomen is soft.  Musculoskeletal:     Right lower leg: No edema.     Left lower leg: No edema.     Medications and allergies   Allergies  Allergen Reactions   Lisinopril Cough   Lisinopril Cough     Medication list   Current Outpatient Medications:    amLODipine (NORVASC) 10 MG tablet, Take 10 mg by mouth at bedtime., Disp: , Rfl:    aspirin 81 MG chewable tablet, Chew 1 tablet (81 mg total) by mouth daily., Disp: 30 tablet, Rfl: 0   calcium acetate (PHOSLO) 667 MG capsule, Take 1,334 mg by mouth at bedtime., Disp: , Rfl:    metoprolol tartrate (LOPRESSOR) 25 MG tablet, Take 25 mg by mouth at bedtime., Disp: , Rfl:     pantoprazole (PROTONIX) 40 MG tablet, Take 80 mg by mouth daily., Disp: , Rfl:    rosuvastatin (CRESTOR) 10 MG tablet, Take 1 tablet (10 mg total) by mouth daily., Disp: 30 tablet, Rfl: 0   sevelamer carbonate (RENVELA) 800 MG tablet, Take 800-1,600 mg by mouth See admin instructions. 1600 mg with meals, 800 mg with snacks., Disp: , Rfl:    venlafaxine XR (EFFEXOR-XR) 150 MG 24 hr  capsule, Take 1 capsule (150 mg total) by mouth daily with breakfast. (Patient taking differently: Take 150 mg by mouth at bedtime.), Disp: 30 capsule, Rfl: 1 Laboratory examination:   Recent Labs    09/29/21 0417 04/23/22 1943 04/24/22 0015 04/24/22 0525  NA 134* 131*  --  133*  K 4.1 4.1  --  5.4*  CL 95* 94*  --  96*  CO2 25 21*  --  22  GLUCOSE 160* 202*  --  112*  BUN 32* 24*  --  30*  CREATININE 6.03* 4.97* 5.52* 5.79*  CALCIUM 8.6* 9.7  --  9.6  GFRNONAA 10* 12* 11* 10*     Lab Results  Component Value Date   GLUCOSE 112 (H) 04/24/2022   NA 133 (L) 04/24/2022   K 5.4 (H) 04/24/2022   CL 96 (L) 04/24/2022   CO2 22 04/24/2022   BUN 30 (H) 04/24/2022   CREATININE 5.79 (H) 04/24/2022   GFRNONAA 10 (L) 04/24/2022   CALCIUM 9.6 04/24/2022   PHOS 7.6 (H) 08/26/2020   PROT 7.1 04/24/2022   ALBUMIN 3.8 04/24/2022   BILITOT 0.4 04/24/2022   ALKPHOS 61 04/24/2022   AST 22 04/24/2022   ALT 26 04/24/2022   ANIONGAP 15 04/24/2022      Lab Results  Component Value Date   ALT 26 04/24/2022   AST 22 04/24/2022   ALKPHOS 61 04/24/2022   BILITOT 0.4 04/24/2022       Latest Ref Rng & Units 04/24/2022    5:25 AM 04/23/2022    7:43 PM 05/10/2020    4:26 AM  Hepatic Function  Total Protein 6.5 - 8.1 g/dL 7.1  7.3  6.2   Albumin 3.5 - 5.0 g/dL 3.8  3.9  2.6   AST 15 - 41 U/L '22  27  18   '$ ALT 0 - 44 U/L '26  23  24   '$ Alk Phosphatase 38 - 126 U/L 61  63  66   Total Bilirubin 0.3 - 1.2 mg/dL 0.4  0.5  0.5     Lipid Panel Recent Labs    04/24/22 0525  CHOL 215*  TRIG 422*  LDLCALC NOT  CALCULATED  VLDL UNABLE TO CALCULATE IF TRIGLYCERIDE OVER 400 mg/dL  HDL 38*  CHOLHDL 5.7     HEMOGLOBIN A1C Lab Results  Component Value Date   HGBA1C 7.7 (H) 04/23/2022   MPG 174.29 04/23/2022   Lab Results  Component Value Date   TSH 2.794 11/16/2016    External labs:   NA  Radiology:   CT Brain 04/23/22: 1. No emergent large vessel occlusion or proximal hemodynamically significant stenosis. 2. CT perfusion reports a small (5 mL) area of penumbra in the left cerebellum, which is of unclear significance. An MRI could provide more sensitive evaluation for acute infarct if clinically warranted.  CXR 2 view 09/29/21: Unchanged cardiomegaly. There are mild interstitial opacities and mid to lower lung predominant alveolar opacities. Small pleural effusions, right greater than left. No pneumothorax. Unchanged anterior compression deformity of T11. Thoracic spondylosis.  Cardiac Studies:   ABI 05/05/2022: This exam reveals normal perfusion of the right lower extremity (ABI 1.14).   This exam reveals normal perfusion of the left lower extremity (ABI 1.21).  Normal triphasic waveform pattern at bilateral ankle level  Echocardiogram 04/28/2022:  1. Left ventricular ejection fraction, by estimation, is 45 to 50%. The left ventricle has mildly decreased function. The left ventricle demonstrates global hypokinesis. There is moderate left ventricular hypertrophy  of the septal segment. Left  ventricular diastolic parameters are consistent with Grade I diastolic dysfunction (impaired relaxation).  2. Right ventricular systolic function is normal. The right ventricular size is normal.  3. The mitral valve is normal in structure. Trivial mitral valve regurgitation. No evidence of mitral stenosis.  4. The aortic valve is normal in structure. Aortic valve regurgitation is mild. No aortic stenosis is present.  5. The inferior vena cava is normal in size with greater than 50% respiratory  variability, suggesting right atrial pressure of 3 mmHg. 6.  Compared to 09/29/2021, normal LVEF, grade 2 diastolic dysfunction  PCV MYOCARDIAL PERFUSION WO LEXISCAN 05/18/2022  Narrative Images from the original result were not included. Exercise nuclear stress test 05/18/2022: Myocardial perfusion is normal. Gated SPECT imaging of the left ventricle demonstrating moderate global hypokinesis. Stress LVEF 33%. Normal ECG stress. The patient exercised for 5 minutes and 30 seconds of a Bruce protocol, achieving approximately 7.05 MET & 89% MPHR. The blood pressure response was hypertensive. The baseline blood pressure was 138/70 mmHg and increased to 220/70 mmHg No previous exam available for comparison. High risk study due to severe LV systolic dysfunction and may suggest non-ischemic cardiomyopathy.    EKG:   EKG 05/04/2022: Normal sinus rhythm at rate of 86 bpm, nonspecific T abnormality, cannot exclude high lateral ischemia.   EKG 04/26/2022: Normal sinus rhythm at rate of 94 bpm, normal axis, no evidence of ischemia  Assessment   No diagnosis found.    No orders of the defined types were placed in this encounter.   No orders of the defined types were placed in this encounter.   There are no discontinued medications.    Recommendations:   [name] [name] is a 62 y.o. Caucasian male patient with hypertension with hypertensive heart disease and mild LV systolic dysfunction, penile implant, secondary hyperparathyroidism, end-stage renal disease on hemodialysis due to bladder cancer SP resection and bladder reconstruction using bowels, left nephrectomy and on hemodialysis on Monday and Friday, Diabetes mellitus, new onset in January 2024 when he was admitted with altered mental status and hypertensive urgency. Patient has a follow-up and states that he is very busy and active without any symptoms of dyspnea or chest pain.  1. Cardiomegaly ***  2. Hypertensive heart and chronic  kidney disease with heart failure and with stage 5 chronic kidney disease, or end stage renal disease (HCC) ***   1. Cardiomegaly Patient asymptomatic without angina or heart failure symptoms.  Not sure that his ejection fraction is truly 45% or is related to hypertension induced cardiomyopathy.  Overall in view of his end-stage renal disease, recent heart failure,?  New onset diabetes, hypercholesterolemia, he will need further cardiac restratification.  I will set him up for exercise nuclear stress test.  2. Hypertensive heart and chronic kidney disease with heart failure and with stage 5 chronic kidney disease, or end stage renal disease ([place]) Patient has moderate LVH on echocardiogram, today the blood pressure is well-controlled, I did not make any changes to his medication.  He is presently on dialysis twice a week, he still makes urine.  3. Type 2 diabetes mellitus without complication, without long-term current use of insulin ([place]) There has been questionable diagnosis of diabetes and A1c was elevated during recent hospitalization, this is being further worked up by his PCP and also nephrology.  4. Abnormal EKG He clearly has an abnormal EKG suggesting high lateral ischemia that is new from recent hospitalization EKG.  Hence nuclear stress  test would be appropriate as well.  5. Abnormal peripheral pulse He has absent pedal pulses however other vascular examination is normal, left AV shunt for dialysis is widely patent.  I will order an ABI.  This also help for further cardiac risk stratification.  I would like to see him back in 6 to 8 weeks for follow-up.    Adrian Prows, MD, [facility] 06/15/2022, 10:55 AM Office: [phone]

## 2022-06-22 ENCOUNTER — Ambulatory Visit: Payer: Medicare Other | Admitting: Cardiology

## 2022-06-22 ENCOUNTER — Encounter: Payer: Self-pay | Admitting: Cardiology

## 2022-06-22 VITALS — BP 145/84 | HR 103 | Resp 16 | Ht 69.0 in | Wt 206.0 lb

## 2022-06-22 DIAGNOSIS — E782 Mixed hyperlipidemia: Secondary | ICD-10-CM

## 2022-06-22 DIAGNOSIS — I132 Hypertensive heart and chronic kidney disease with heart failure and with stage 5 chronic kidney disease, or end stage renal disease: Secondary | ICD-10-CM

## 2022-06-22 DIAGNOSIS — I517 Cardiomegaly: Secondary | ICD-10-CM

## 2022-06-22 DIAGNOSIS — I428 Other cardiomyopathies: Secondary | ICD-10-CM

## 2022-06-22 MED ORDER — METOPROLOL TARTRATE 25 MG PO TABS: 25.0000 mg | ORAL_TABLET | Freq: Two times a day (BID) | ORAL | 1 refills | Status: DC

## 2022-06-22 MED ORDER — ROSUVASTATIN CALCIUM 20 MG PO TABS: 20.0000 mg | ORAL_TABLET | Freq: Every day | ORAL | 3 refills | Status: DC

## 2022-06-22 MED ORDER — AMLODIPINE BESYLATE 10 MG PO TABS: 10.0000 mg | ORAL_TABLET | Freq: Every day | ORAL | 1 refills | Status: AC

## 2022-06-22 NOTE — Progress Notes (Signed)
Primary Physician/Referring:  Nickola Major, MD  Patient ID: [name] [name], male    DOB: [birth year], 62 y.o.   MRN: [id number]  Chief Complaint  Patient presents with   Hypertension   Cardiomyopathy   Follow-up    6 week    HPI:    [name] [name]  is a 62 y.o. Caucasian male patient with hypertension with hypertensive heart disease and mild LV systolic dysfunction, penile implant, secondary hyperparathyroidism, end-stage renal disease on hemodialysis due to bladder cancer SP resection and bladder reconstruction using bowels, left nephrectomy and on hemodialysis on Monday and Friday, Diabetes mellitus, new onset in January 2024 when he was admitted with altered mental status and hypertensive urgency.  Due to decreased LVEF noted on echocardiogram and cardiovascular risk factors, he underwent nuclear stress test and presents for follow-up.  He has been compliant with all his medications. 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  He remains asymptomatic.    Past Medical History:  Diagnosis Date   Allergy    Anemia    Anemia in chronic kidney disease (CKD) 08/12/2016   Anxiety    Bladder cancer ([place]) 2013   Blood transfusion without reported diagnosis    Coagulation defect, unspecified ([place]) [id number]   Complication of anesthesia    agitation w/awakening in 9/13,OK 01/24/12   Depression    Diabetes mellitus type 2, diet-controlled ([place]) PT STOPPED TAKING METFORMIN--  HAS BEEN WATCHING DIET, EXERCISING AND LOSING WT   ESRD (end stage renal disease) ([place]) 08/11/2016   Frequency of urination    GERD (gastroesophageal reflux disease)    Gout    recent flair -bilateral feet--  STABLE   Grade II diastolic dysfunction    Hyperkalemia 08/12/2016   Hyperlipidemia    Hypertension    Hypertensive kidney disease with CKD (chronic kidney disease)    Nocturia    Non-compliance with renal dialysis    Secondary hyperparathyroidism of renal origin  ([place]) 08/12/2016   Urgency incontinence    Past Surgical History:  Procedure Laterality Date   AV FISTULA PLACEMENT Left 05/31/2014   Procedure: LEFT RADIOCEPHALIC ARTERIOVENOUS (AV) FISTULA CREATION ;  Surgeon: Mal Misty, MD;  Location: [facility];  Service: Vascular;  Laterality: Left;   BACK SURGERY     BLADDER SURGERY  04/26/2013   at the cancer treatment [facility] of [place] in  [place]     COLONOSCOPY     CYSTOSCOPY W/ RETROGRADES Bilateral 07/10/2012   Procedure: [place];  Surgeon: Molli Hazard, MD;  Location: [facility];  Service: Urology;  Laterality: Bilateral;   CYSTOSCOPY WITH URETHRAL DILATATION  01/24/2012   Procedure: CYSTOSCOPY WITH URETHRAL DILATATION;  Surgeon: Molli Hazard, MD;  Location: [facility];  Service: Urology;  Laterality: N/A;   IR NEPHROSTOMY PLACEMENT LEFT  07/16/2016   LUMBAR LAMINECTOMY  06-02-2005   W/ RESECTION NERVE ROOT  L4  -  L5   NEPHROSTOMY TUBE PLACEMENT ([place] [facility])  2015   right    TRANSURETHRAL RESECTION OF BLADDER TUMOR  12/15/2011   Procedure: TRANSURETHRAL RESECTION OF BLADDER TUMOR (TURBT);  Surgeon: Molli Hazard, MD;  Location[facility];  Service: Urology;  Laterality: N/A;  2 HRS    TRANSURETHRAL RESECTION OF BLADDER TUMOR  01/24/2012   Procedure: TRANSURETHRAL RESECTION OF BLADDER TUMOR (TURBT);  Surgeon: Molli Hazard, MD;  Location: [facility];  Service:  Urology;  Laterality: N/A;  90 MIN    TRANSURETHRAL RESECTION OF BLADDER TUMOR N/A 07/10/2012   Procedure: TRANSURETHRAL RESECTION OF BLADDER TUMOR (TURBT);  Surgeon: Molli Hazard, MD;  Location: [facility];  Service: Urology;  Laterality: N/A;   ulner artery repair Right 2009   rt -trauma /thrombectomy,repair   Family History  Problem Relation Age of Onset   Heart disease Mother    Diabetes Maternal Grandmother    Brain cancer Paternal  Uncle    Hypertension Other    Bladder Cancer Neg Hx    Colon cancer Neg Hx    Esophageal cancer Neg Hx    Rectal cancer Neg Hx    Stomach cancer Neg Hx     Social History   Tobacco Use   Smoking status: Former    Packs/day: 1.00    Years: 16.00    Additional pack years: 0.00    Total pack years: 16.00    Types: Cigarettes    Quit date: 01/20/1992    Years since quitting: 30.4   Smokeless tobacco: Former    Types: Chew    Quit date: 01/20/1992  Substance Use Topics   Alcohol use: No    Alcohol/week: 0.0 standard drinks of alcohol   Marital Status: Married  ROS  Review of Systems  Cardiovascular:  Negative for chest pain, dyspnea on exertion and leg swelling.   Objective      06/22/2022    3:08 PM 05/04/2022    9:10 AM 04/25/2022    1:05 PM  Vitals with BMI  Height 5\' 9"  5\' 9"    Weight 206 lbs 199 lbs 13 oz   BMI 0000000 99991111   Systolic Q000111Q Q000111Q Q000111Q  Diastolic 84 73 79  Pulse [id number] 90 93   SpO2: 95 %  Physical Exam Neck:     Vascular: No carotid bruit or JVD.  Cardiovascular:     Rate and Rhythm: Normal rate and regular rhythm.     Pulses: Intact distal pulses.     Heart sounds: Normal heart sounds. No murmur heard.    No gallop.     Comments: Left forearm AV graft for dialysis noted Pulmonary:     Effort: Pulmonary effort is normal.     Breath sounds: Normal breath sounds.  Abdominal:     General: Bowel sounds are normal.     Palpations: Abdomen is soft.  Musculoskeletal:     Right lower leg: No edema.     Left lower leg: No edema.     Medications and allergies   Allergies  Allergen Reactions   Lisinopril Cough   Lisinopril Cough     Medication list   Current Outpatient Medications:    aspirin 81 MG chewable tablet, Chew 1 tablet (81 mg total) by mouth daily., Disp: 30 tablet, Rfl: 0   calcium acetate (PHOSLO) 667 MG capsule, Take 1,334 mg by mouth at bedtime., Disp: , Rfl:    pantoprazole (PROTONIX) 40 MG tablet, Take 80 mg by mouth daily.,  Disp: , Rfl:    sevelamer carbonate (RENVELA) 800 MG tablet, Take 800-1,600 mg by mouth See admin instructions. 1600 mg with meals, 800 mg with snacks., Disp: , Rfl:    venlafaxine XR (EFFEXOR-XR) 150 MG 24 hr capsule, Take 1 capsule (150 mg total) by mouth daily with breakfast. (Patient taking differently: Take 150 mg by mouth at bedtime.), Disp: 30 capsule, Rfl: 1   amLODipine (NORVASC) 10 MG tablet, Take 1 tablet (10  mg total) by mouth at bedtime. Do not take on days of dialysis unless BP > 120 mm Hg, Disp: 90 tablet, Rfl: 1   metoprolol tartrate (LOPRESSOR) 25 MG tablet, Take 1 tablet (25 mg total) by mouth 2 (two) times daily. Hold on the days of dialysis, Disp: 180 tablet, Rfl: 1   rosuvastatin (CRESTOR) 20 MG tablet, Take 1 tablet (20 mg total) by mouth daily., Disp: 90 tablet, Rfl: 3 Laboratory examination:   Recent Labs    09/29/21 0417 04/23/22 1943 04/24/22 0015 04/24/22 0525  NA 134* 131*  --  133*  K 4.1 4.1  --  5.4*  CL 95* 94*  --  96*  CO2 25 21*  --  22  GLUCOSE 160* 202*  --  112*  BUN 32* 24*  --  30*  CREATININE 6.03* 4.97* 5.52* 5.79*  CALCIUM 8.6* 9.7  --  9.6  GFRNONAA 10* 12* 11* 10*    Lab Results  Component Value Date   GLUCOSE 112 (H) 04/24/2022   NA 133 (L) 04/24/2022   K 5.4 (H) 04/24/2022   CL 96 (L) 04/24/2022   CO2 22 04/24/2022   BUN 30 (H) 04/24/2022   CREATININE 5.79 (H) 04/24/2022   GFRNONAA 10 (L) 04/24/2022   CALCIUM 9.6 04/24/2022   PHOS 7.6 (H) 08/26/2020   PROT 7.1 04/24/2022   ALBUMIN 3.8 04/24/2022   BILITOT 0.4 04/24/2022   ALKPHOS 61 04/24/2022   AST 22 04/24/2022   ALT 26 04/24/2022   ANIONGAP 15 04/24/2022      Lab Results  Component Value Date   ALT 26 04/24/2022   AST 22 04/24/2022   ALKPHOS 61 04/24/2022   BILITOT 0.4 04/24/2022       Latest Ref Rng & Units 04/24/2022    5:25 AM 04/23/2022    7:43 PM 05/10/2020    4:26 AM  Hepatic Function  Total Protein 6.5 - 8.1 g/dL 7.1  7.3  6.2   Albumin 3.5 - 5.0 g/dL  3.8  3.9  2.6   AST 15 - 41 U/L 22  27  18    ALT 0 - 44 U/L 26  23  24    Alk Phosphatase 38 - 126 U/L 61  63  66   Total Bilirubin 0.3 - 1.2 mg/dL 0.4  0.5  0.5     Lipid Panel Recent Labs    04/24/22 0525  CHOL 215*  TRIG 422*  LDLCALC NOT CALCULATED  VLDL UNABLE TO CALCULATE IF TRIGLYCERIDE OVER 400 mg/dL  HDL 38*  CHOLHDL 5.7    HEMOGLOBIN A1C Lab Results  Component Value Date   HGBA1C 7.7 (H) 04/23/2022   MPG 174.29 04/23/2022   Lab Results  Component Value Date   TSH 2.794 11/16/2016    External labs:   NA  Radiology:   CT Brain 04/23/22: 1. No emergent large vessel occlusion or proximal hemodynamically significant stenosis. 2. CT perfusion reports a small (5 mL) area of penumbra in the left cerebellum, which is of unclear significance. An MRI could provide more sensitive evaluation for acute infarct if clinically warranted.  CXR 2 view 09/29/21: Unchanged cardiomegaly. There are mild interstitial opacities and mid to lower lung predominant alveolar opacities. Small pleural effusions, right greater than left. No pneumothorax. Unchanged anterior compression deformity of T11. Thoracic spondylosis.  Cardiac Studies:   ABI 05/05/2022: This exam reveals normal perfusion of the right lower extremity (ABI 1.14).   This exam reveals normal perfusion of the left  lower extremity (ABI 1.21).  Normal triphasic waveform pattern at bilateral ankle level  Echocardiogram 04/28/2022:  1. Left ventricular ejection fraction, by estimation, is 45 to 50%. The left ventricle has mildly decreased function. The left ventricle demonstrates global hypokinesis. There is moderate left ventricular hypertrophy of the septal segment. Left  ventricular diastolic parameters are consistent with Grade I diastolic dysfunction (impaired relaxation).  2. Right ventricular systolic function is normal. The right ventricular size is normal.  3. The mitral valve is normal in structure. Trivial  mitral valve regurgitation. No evidence of mitral stenosis.  4. The aortic valve is normal in structure. Aortic valve regurgitation is mild. No aortic stenosis is present.  5. The inferior vena cava is normal in size with greater than 50% respiratory variability, suggesting right atrial pressure of 3 mmHg. 6.  Compared to 09/29/2021, normal LVEF, grade 2 diastolic dysfunction  PCV MYOCARDIAL PERFUSION WO LEXISCAN 05/18/2022  Narrative Images from the original result were not included. Exercise nuclear stress test 05/18/2022: Myocardial perfusion is normal. Gated SPECT imaging of the left ventricle demonstrating moderate global hypokinesis. Stress LVEF 33%. Normal ECG stress. The patient exercised for 5 minutes and 30 seconds of a Bruce protocol, achieving approximately 7.05 MET & 89% MPHR. The blood pressure response was hypertensive. The baseline blood pressure was 138/70 mmHg and increased to 220/70 mmHg No previous exam available for comparison. High risk study due to severe LV systolic dysfunction and may suggest non-ischemic cardiomyopathy.    EKG:   EKG 05/04/2022: Normal sinus rhythm at rate of 86 bpm, nonspecific T abnormality, cannot exclude high lateral ischemia.   EKG 04/26/2022: Normal sinus rhythm at rate of 94 bpm, normal axis, no evidence of ischemia  Assessment     ICD-10-CM   1. Cardiomegaly  I51.7 PCV ECHOCARDIOGRAM COMPLETE    2. Non-ischemic cardiomyopathy (HCC)  I42.8 PCV ECHOCARDIOGRAM COMPLETE    3. Hypertensive heart and chronic kidney disease with heart failure and with stage 5 chronic kidney disease, or end stage renal disease (HCC)  I13.2 metoprolol tartrate (LOPRESSOR) 25 MG tablet    amLODipine (NORVASC) 10 MG tablet    4. Mixed hyperlipidemia  E78.2 rosuvastatin (CRESTOR) 20 MG tablet    Lipid Panel With LDL/HDL Ratio       Orders Placed This Encounter  Procedures   Lipid Panel With LDL/HDL Ratio   PCV ECHOCARDIOGRAM COMPLETE    Standing Status:    Future    Standing Expiration Date:   06/22/2023    Meds ordered this encounter  Medications   rosuvastatin (CRESTOR) 20 MG tablet    Sig: Take 1 tablet (20 mg total) by mouth daily.    Dispense:  90 tablet    Refill:  3   metoprolol tartrate (LOPRESSOR) 25 MG tablet    Sig: Take 1 tablet (25 mg total) by mouth 2 (two) times daily. Hold on the days of dialysis    Dispense:  180 tablet    Refill:  1   amLODipine (NORVASC) 10 MG tablet    Sig: Take 1 tablet (10 mg total) by mouth at bedtime. Do not take on days of dialysis unless BP > 120 mm Hg    Dispense:  90 tablet    Refill:  1    Medications Discontinued During This Encounter  Medication Reason   rosuvastatin (CRESTOR) 10 MG tablet Reorder   amLODipine (NORVASC) 10 MG tablet Reorder   metoprolol tartrate (LOPRESSOR) 25 MG tablet Reorder     Recommendations:  [name] [name] is a 62 y.o. Caucasian male patient with hypertension with hypertensive heart disease and mild LV systolic dysfunction, penile implant, secondary hyperparathyroidism, end-stage renal disease on hemodialysis due to bladder cancer SP resection and bladder reconstruction using bowels, left nephrectomy and on hemodialysis on Monday and Friday, Diabetes mellitus, new onset in January 2024 when he was admitted with altered mental status and hypertensive urgency.  Due to decreased LVEF noted on echocardiogram and cardiovascular risk factors, he underwent nuclear stress test and presents for follow-up.  He remains active and states that he does not have any symptoms of dyspnea or chest pain or palpitations.  No dizziness or syncope.  1. Cardiomegaly Patient's cardiomegaly is related to nonischemic cardiomyopathy.  This is related to uncontrolled hypertension and hypertensive renal and cardiac disease.  2. Non-ischemic cardiomyopathy ([place]) I reviewed his nuclear stress test, there is no evidence of ischemia and there is moderate LV dilatation and severe LV  systolic dysfunction.  Although high risk, suspect nonischemic cardiomyopathy than true ischemic cardiomyopathy.  Aggressive control of blood pressure is indicated.  I increase his metoprolol to tartrate from 25 mg daily to 25 mg twice daily and told him to hold it on the days of dialysis which she was not doing.  He was having issues postdialysis where his blood pressure was dipping down.  He is also on amlodipine 10 mg daily, in the evening.  Advised him to hold the amlodipine if systolic blood pressure is <120 mmHg on the days of dialysis.  3. Hypertensive heart and chronic kidney disease with heart failure and with stage 5 chronic kidney disease, or end stage renal disease ([place]) I have again discussed with him the importance of adhering to the diet and exercise program and lose weight as he will be a good candidate for renal transplantation.  4. Mixed hyperlipidemia Patient is now tolerating Crestor 10 mg daily, will increase it to 20 mg daily, will obtain lipid profile testing in 4 to 6 weeks.  I given him a printed list for lab draw.  Patient was evaluated by labs to confirm that he is diabetic, he was recommended that he start therapy for diabetes but I am not sure that he is on it.  I have highly encouraged the patient to make an appointment to see his PCP to further discuss his labs and also therapy for diabetes mellitus as well.  He is abnormal lower extremity pulse, however ABI is within normal limits.  He has no symptoms of claudication.  Otherwise he remained stable from cardiac standpoint, I will see him back in 6 months with repeat echocardiogram or sooner if problems.  Advised the patient he should contact me if it were to develop chest pain, shortness of breath or leg swelling.    Adrian Prows, MD, [facility] 06/22/2022, 4:39 PM Office: [phone]

## 2022-06-22 NOTE — Patient Instructions (Signed)
I have increased the dose of your cholesterol medication Crestor to 20 mg daily.  Please get blood work done for cholesterol, do not eat anything for 6 to 8 hours prior to blood work, get this done sometime next month after you have started 20 mg dose.

## 2022-08-27 ENCOUNTER — Telehealth: Payer: Self-pay

## 2022-08-27 NOTE — Telephone Encounter (Signed)
Thanks John. Agree this should be done at the hospital. Can add to hospital list. Lavonna Rua have we started general hospital list yet? If not I will add to my personal list.

## 2022-08-27 NOTE — Telephone Encounter (Signed)
Dr. Adela Lank, It appears this patient is scheduled for a recall colon/ "pre transplant" colon with you on 09/15/2022, however, it is documented in this patient's chart that he is supposed to be receiving dialysis but is non compliant with treatment per the last ED note.  Is this patient appropriate for [facility] or does he need to be rescheduled for [facility] due to end stage renal failure and needing dialysis? This patient's PV is scheduled on 09/03/2022 in room 53.  Please advise Thank you Bre

## 2022-08-31 NOTE — Telephone Encounter (Signed)
He will need to be told he will be be contacted at a later date to reschedule to the hospital. I would cancel the pre-visit for now.

## 2022-09-02 NOTE — Telephone Encounter (Signed)
Attempted to reach patient again; no answer; left message for patient to call back to the office concerning his Pre Visit appt tomorrow;

## 2022-09-02 NOTE — Telephone Encounter (Signed)
Called to speak with patient concerning Pre Visit and procedure needing to be scheduled at [facility]- Unable to reach patient- PV appt cancelled and message left for the patient to call back to the office for this information;  Will attempt to reach patient at a later time;

## 2022-09-03 NOTE — Telephone Encounter (Signed)
PV was cancelled;

## 2022-09-09 ENCOUNTER — Other Ambulatory Visit: Payer: Self-pay

## 2022-09-09 ENCOUNTER — Telehealth: Payer: Self-pay

## 2022-09-09 DIAGNOSIS — Z86010 Personal history of colonic polyps: Secondary | ICD-10-CM

## 2022-09-09 NOTE — Progress Notes (Signed)
Scheduled patient for colonoscopy at [facility] on 8-20 at 10:45am. Patient understands he will need to arrive at 9:15am

## 2022-09-09 NOTE — Telephone Encounter (Signed)
Called and left message for patient that Dr. Adela Lank could do his colonoscopy on Tuesday, 8-20. Asked that patient call back to let us know if he can do that morning.  He will need a previsit 2 weeks prior to procedure to be instructed.

## 2022-09-09 NOTE — Telephone Encounter (Signed)
Patient called back. Confirmed he could do colonoscopy on Tuesday, 8-20 at  [facility].  He has been scheduled for procedure at 10:45am. We scheduled him for a nurse PV on Tuesday, 8-6 at 10:30am. Patient understands to come to the 2nd floor. He confirmed he is not on BT.

## 2022-09-15 ENCOUNTER — Encounter: Payer: Medicare Other | Admitting: Gastroenterology

## 2022-09-21 NOTE — Telephone Encounter (Signed)
Called and spoke to patient.  He is currently scheduled in August. I Let him know Dr. Adela Lank is full for July 18 however we have one patient that may need to cancel so it is possible we could be able to move him up to the 18th of July. He confirmed he could do that date if a spot opens. I will let him know as soon as we can. Will need to reschedule his Pre visit to an earlier date

## 2022-09-21 NOTE — Telephone Encounter (Signed)
PT wife requesting call back. PT is trying to get on transplant list and wants to know if he can be scheduled any earlier for colonoscopy. Please advise,

## 2022-10-06 ENCOUNTER — Telehealth: Payer: Self-pay

## 2022-10-06 ENCOUNTER — Other Ambulatory Visit: Payer: Self-pay

## 2022-10-06 DIAGNOSIS — Z86010 Personal history of colonic polyps: Secondary | ICD-10-CM

## 2022-10-06 MED ORDER — NA SULFATE-K SULFATE-MG SULF 17.5-3.13-1.6 GM/177ML PO SOLN: 1.0000 | Freq: Once | ORAL | 0 refills | Status: AC

## 2022-10-06 NOTE — Telephone Encounter (Signed)
Called and left patient a detailed vm offering him a sooner colonoscopy appt at [facility] with Dr. Adela Lank. The appt would be Thursday, 10/21/22 at 7:30 am. Pt would need to arrive by 6 am with a care partner. No PV appts available. We can review instructions with patient over the phone and he can pick up a copy of his written instructions. He may not receive in the mail in time. I asked that patient give me a call back to let me know how he wishes to proceed.

## 2022-10-06 NOTE — Telephone Encounter (Signed)
Patient returned call. Pt is available for colonoscopy at [facility] on 10/21/22 at 7:30 am. Arrival time 6 am with a care partner. Patient will stop by our office later this week or early next week to pick up written prep instructions. I did review main portion of prep with patient. Pt knows to pick up prep from [facility] in [place]. Repaglinide was listed on outside medication list - pt denies being on this medication currently, I told him that he would need to hold this the morning of the procedure if he is taking this and I will still include in his instructions. Patient denies any anticoagulation therapy. Pt verbalized understanding of all information and had no concerns at the end of the call.   SUPREP sent to pharmacy on file. Ambulatory referral to GI in epic. Colonoscopy instructions printed and placed at 2nd floor receptionist desk.

## 2022-10-11 ENCOUNTER — Encounter (HOSPITAL_COMMUNITY): Payer: Self-pay | Admitting: Gastroenterology

## 2022-10-11 NOTE — Progress Notes (Signed)
Attempted to obtain medical history via telephone, unable to reach at this time. HIPAA compliant voicemail message left requesting return call to pre surgical testing department. 

## 2022-10-21 ENCOUNTER — Ambulatory Visit (HOSPITAL_COMMUNITY)
Admission: RE | Admit: 2022-10-21 | Discharge: 2022-10-21 | Disposition: A | Discharge status: Home / Self Care | Payer: Medicare Other | Attending: Gastroenterology | Admitting: Gastroenterology

## 2022-10-21 ENCOUNTER — Other Ambulatory Visit: Payer: Self-pay

## 2022-10-21 ENCOUNTER — Ambulatory Visit (HOSPITAL_BASED_OUTPATIENT_CLINIC_OR_DEPARTMENT_OTHER): Payer: Medicare Other | Admitting: Anesthesiology

## 2022-10-21 ENCOUNTER — Ambulatory Visit (HOSPITAL_COMMUNITY): Payer: Medicare Other | Admitting: Anesthesiology

## 2022-10-21 ENCOUNTER — Encounter (HOSPITAL_COMMUNITY): Payer: Self-pay | Admitting: Gastroenterology

## 2022-10-21 ENCOUNTER — Encounter (HOSPITAL_COMMUNITY)
Admission: RE | Disposition: A | Discharge status: Home / Self Care | Payer: Self-pay | Source: Home / Self Care | Attending: Gastroenterology

## 2022-10-21 DIAGNOSIS — N186 End stage renal disease: Secondary | ICD-10-CM | POA: Diagnosis not present

## 2022-10-21 DIAGNOSIS — I509 Heart failure, unspecified: Secondary | ICD-10-CM

## 2022-10-21 DIAGNOSIS — K644 Residual hemorrhoidal skin tags: Secondary | ICD-10-CM | POA: Insufficient documentation

## 2022-10-21 DIAGNOSIS — I132 Hypertensive heart and chronic kidney disease with heart failure and with stage 5 chronic kidney disease, or end stage renal disease: Secondary | ICD-10-CM

## 2022-10-21 DIAGNOSIS — D123 Benign neoplasm of transverse colon: Secondary | ICD-10-CM | POA: Diagnosis not present

## 2022-10-21 DIAGNOSIS — Z8249 Family history of ischemic heart disease and other diseases of the circulatory system: Secondary | ICD-10-CM | POA: Insufficient documentation

## 2022-10-21 DIAGNOSIS — D126 Benign neoplasm of colon, unspecified: Secondary | ICD-10-CM

## 2022-10-21 DIAGNOSIS — I120 Hypertensive chronic kidney disease with stage 5 chronic kidney disease or end stage renal disease: Secondary | ICD-10-CM | POA: Insufficient documentation

## 2022-10-21 DIAGNOSIS — Z833 Family history of diabetes mellitus: Secondary | ICD-10-CM | POA: Diagnosis not present

## 2022-10-21 DIAGNOSIS — Z992 Dependence on renal dialysis: Secondary | ICD-10-CM | POA: Insufficient documentation

## 2022-10-21 DIAGNOSIS — Z1211 Encounter for screening for malignant neoplasm of colon: Secondary | ICD-10-CM | POA: Diagnosis not present

## 2022-10-21 DIAGNOSIS — D122 Benign neoplasm of ascending colon: Secondary | ICD-10-CM | POA: Diagnosis not present

## 2022-10-21 DIAGNOSIS — Z86010 Personal history of colonic polyps: Secondary | ICD-10-CM | POA: Insufficient documentation

## 2022-10-21 DIAGNOSIS — K648 Other hemorrhoids: Secondary | ICD-10-CM | POA: Diagnosis not present

## 2022-10-21 DIAGNOSIS — Z87891 Personal history of nicotine dependence: Secondary | ICD-10-CM | POA: Diagnosis not present

## 2022-10-21 DIAGNOSIS — E1122 Type 2 diabetes mellitus with diabetic chronic kidney disease: Secondary | ICD-10-CM | POA: Diagnosis not present

## 2022-10-21 HISTORY — PX: HEMOSTASIS CLIP PLACEMENT: SHX6857

## 2022-10-21 HISTORY — PX: HEMOSTASIS CONTROL: SHX6838

## 2022-10-21 HISTORY — PX: COLONOSCOPY WITH PROPOFOL: SHX5780

## 2022-10-21 HISTORY — PX: POLYPECTOMY: SHX5525

## 2022-10-21 LAB — POCT I-STAT, CHEM 8
BUN: 64 mg/dL — ABNORMAL HIGH (ref 8–23)
Calcium, Ion: 1.1 mmol/L — ABNORMAL LOW (ref 1.15–1.40)
Chloride: 96 mmol/L — ABNORMAL LOW (ref 98–111)
Creatinine, Ser: 9.5 mg/dL — ABNORMAL HIGH (ref 0.61–1.24)
Glucose, Bld: 176 mg/dL — ABNORMAL HIGH (ref 70–99)
HCT: 31 % — ABNORMAL LOW (ref 39.0–52.0)
Hemoglobin: 10.5 g/dL — ABNORMAL LOW (ref 13.0–17.0)
Potassium: 5.2 mmol/L — ABNORMAL HIGH (ref 3.5–5.1)
Sodium: 130 mmol/L — ABNORMAL LOW (ref 135–145)
TCO2: 26 mmol/L (ref 22–32)

## 2022-10-21 SURGERY — COLONOSCOPY WITH PROPOFOL
Anesthesia: Monitor Anesthesia Care

## 2022-10-21 MED ORDER — LABETALOL HCL 5 MG/ML IV SOLN
INTRAVENOUS | Status: AC
  Filled 2022-10-21: qty 4

## 2022-10-21 MED ORDER — ONDANSETRON HCL 4 MG/2ML IJ SOLN
INTRAMUSCULAR | Status: DC | PRN
  Administered 2022-10-21: 4 mg via INTRAVENOUS

## 2022-10-21 MED ORDER — SODIUM CHLORIDE 0.9 % IV SOLN: INTRAVENOUS | Status: DC | PRN

## 2022-10-21 MED ORDER — PROPOFOL 500 MG/50ML IV EMUL
INTRAVENOUS | Status: DC | PRN
  Administered 2022-10-21: 50 mg via INTRAVENOUS
  Administered 2022-10-21: 100 ug/kg/min via INTRAVENOUS

## 2022-10-21 MED ORDER — LIDOCAINE HCL (CARDIAC) PF 100 MG/5ML IV SOSY
PREFILLED_SYRINGE | INTRAVENOUS | Status: DC | PRN
  Administered 2022-10-21: 80 mg via INTRATRACHEAL

## 2022-10-21 MED ORDER — SODIUM CHLORIDE 0.9 % IV SOLN: INTRAVENOUS | Status: DC

## 2022-10-21 MED ORDER — SODIUM CHLORIDE (PF) 0.9 % IJ SOLN
PREFILLED_SYRINGE | INTRAMUSCULAR | Status: DC | PRN
  Administered 2022-10-21: 5 mL

## 2022-10-21 MED ORDER — LABETALOL HCL 5 MG/ML IV SOLN
5.0000 mg | Freq: Once | INTRAVENOUS | Status: AC
  Administered 2022-10-21: 5 mg via INTRAVENOUS

## 2022-10-21 SURGICAL SUPPLY — 22 items

## 2022-10-21 NOTE — Anesthesia Postprocedure Evaluation (Signed)
Anesthesia Post Note  Patient: [name] [name]  Procedure(s) Performed: COLONOSCOPY WITH PROPOFOL POLYPECTOMY HEMOSTASIS CLIP PLACEMENT HEMOSTASIS CONTROL     Patient location during evaluation: PACU Anesthesia Type: MAC Level of consciousness: awake and alert Pain management: pain level controlled Vital Signs Assessment: post-procedure vital signs reviewed and stable Respiratory status: spontaneous breathing, nonlabored ventilation, respiratory function stable and patient connected to nasal cannula oxygen Cardiovascular status: stable and blood pressure returned to baseline Postop Assessment: no apparent nausea or vomiting Anesthetic complications: no   No notable events documented.  Last Vitals:  Vitals:   10/21/22 1010 10/21/22 1020  BP: (!) 180/56 (!) 137/97  Pulse: 79 77  Resp: 12 11  Temp:    SpO2: 96% 97%    Last Pain:  Vitals:   10/21/22 1020  TempSrc:   PainSc: 0-No pain                 Earl Lites P Caragh Gasper

## 2022-10-21 NOTE — Anesthesia Preprocedure Evaluation (Signed)
Anesthesia Evaluation  Patient identified by MRN, date of birth, ID band Patient awake    Reviewed: Allergy & Precautions, NPO status , Patient's Chart, lab work & pertinent test results  Airway Mallampati: II  TM Distance: >3 FB Neck ROM: Full    Dental no notable dental hx.    Pulmonary neg pulmonary ROS, former smoker   Pulmonary exam normal        Cardiovascular hypertension, Pt. on medications and Pt. on home beta blockers +CHF   Rhythm:Regular Rate:Normal  Exercise nuclear stress test 05/18/2022: Myocardial perfusion is normal. Gated SPECT imaging of the left ventricle demonstrating moderate global hypokinesis. Stress LVEF 33%. Normal ECG stress. The patient exercised for 5 minutes and 30 seconds of a Bruce protocol, achieving approximately 7.05 MET & 89% MPHR.  The blood pressure response was hypertensive. The baseline blood pressure was 138/70 mmHg and increased to 220/70 mmHg No previous exam available for comparison. High risk study due to severe LV systolic dysfunction and may suggest non-ischemic cardiomyopathy.        Neuro/Psych   Anxiety Depression       GI/Hepatic Neg liver ROS,GERD  Medicated,,  Endo/Other  diabetes    Renal/GU ESRFRenal disease  negative genitourinary   Musculoskeletal negative musculoskeletal ROS (+)    Abdominal Normal abdominal exam  (+)   Peds  Hematology  (+) Blood dyscrasia, anemia   Anesthesia Other Findings   Reproductive/Obstetrics                             Anesthesia Physical Anesthesia Plan  ASA: 3  Anesthesia Plan: MAC   Post-op Pain Management:    Induction: Intravenous  PONV Risk Score and Plan: 1 and Propofol infusion and Treatment may vary due to age or medical condition  Airway Management Planned: Simple Face Mask and Nasal Cannula  Additional Equipment: None  Intra-op Plan:   Post-operative Plan:   Informed Consent:  I have reviewed the patients History and Physical, chart, labs and discussed the procedure including the risks, benefits and alternatives for the proposed anesthesia with the patient or authorized representative who has indicated his/her understanding and acceptance.     Dental advisory given  Plan Discussed with: CRNA  Anesthesia Plan Comments:        Anesthesia Quick Evaluation

## 2022-10-21 NOTE — Transfer of Care (Signed)
Immediate Anesthesia Transfer of Care Note  Patient: [name] [name]  Procedure(s) Performed: COLONOSCOPY WITH PROPOFOL POLYPECTOMY HEMOSTASIS CLIP PLACEMENT HEMOSTASIS CONTROL  Patient Location: PACU  Anesthesia Type:MAC  Level of Consciousness: awake and alert   Airway & Oxygen Therapy: Patient Spontanous Breathing and Patient connected to face mask oxygen  Post-op Assessment: Report given to RN and Post -op Vital signs reviewed and stable  Post vital signs: Reviewed and stable  Last Vitals:  Vitals Value Taken Time  BP 172/53 10/21/22 0901  Temp 36.6 C 10/21/22 0901  Pulse 93 10/21/22 0908  Resp 20 10/21/22 0908  SpO2 100 % 10/21/22 0908  Vitals shown include unfiled device data.  Last Pain:  Vitals:   10/21/22 0901  TempSrc: Temporal  PainSc: 0-No pain         Complications: No notable events documented.

## 2022-10-21 NOTE — Progress Notes (Signed)
See procedure note for details. Patient had some unexpected bleeding post polypectomy that was technically challenging to stop. Used a variety of modalities with eventual hemostasis. We monitored him post procedure, vitals are stable. He denies any pain, has not passed any blood per rectum. I think he is okay to go home at this time with precautions. I explained that it is likely he will pass some old blood that remained in his colon post procedure. If he has fresh blood or any persistent bleeding he will need to come back to the hospital. Hopefully the risk for that is low at this time. All questions answered, good discussion with the patient and his wife. He feels well and wishes to go home at this time.

## 2022-10-21 NOTE — H&P (Signed)
Gastroenterology History and Physical   Primary Care Physician:  [name], MD   Reason for Procedure:   History of colon polyps  Plan:    colonoscopy     HPI: [name] [name] is a 62 y.o. male  here for colonoscopy surveillance - history of 10 polyps removed on last colonoscopy 04/2018 - multiple adenomas / SSP. Patient denies any bowel symptoms at this time. No family history of colon cancer known. Otherwise feels well without any cardiopulmonary symptoms. He has a history of ESRD on HD, thus his case is being done at the hospital with anesthesia support.   I have discussed risks / benefits of anesthesia and endoscopic procedure with [name] and they wish to proceed with the exams as outlined today.    Past Medical History:  Diagnosis Date   Allergy    Anemia    Anemia in chronic kidney disease (CKD) 08/12/2016   Anxiety    Bladder cancer (HCC) 2013   Blood transfusion without reported diagnosis    Coagulation defect, unspecified (HCC) 08/26/2016   Complication of anesthesia    agitation w/awakening in 9/13,OK 01/24/12   Depression    Diabetes mellitus type 2, diet-controlled (HCC) PT STOPPED TAKING METFORMIN--  HAS BEEN WATCHING DIET, EXERCISING AND LOSING WT   ESRD (end stage renal disease) (HCC) 08/11/2016   Frequency of urination    GERD (gastroesophageal reflux disease)    Gout    recent flair -bilateral feet--  STABLE   Grade II diastolic dysfunction    Hyperkalemia 08/12/2016   Hyperlipidemia    Hypertension    Hypertensive kidney disease with CKD (chronic kidney disease)    Nocturia    Non-compliance with renal dialysis    Secondary hyperparathyroidism of renal origin (HCC) 08/12/2016   Urgency incontinence     Past Surgical History:  Procedure Laterality Date   AV FISTULA PLACEMENT Left 05/31/2014   Procedure: LEFT RADIOCEPHALIC ARTERIOVENOUS (AV) FISTULA CREATION ;  Surgeon: Pryor Ochoa, MD;  Location: [facility] OR;  Service:  Vascular;  Laterality: Left;   BACK SURGERY     BLADDER SURGERY  04/26/2013   at the [facility] [place] in  [place]     COLONOSCOPY     CYSTOSCOPY W/ RETROGRADES Bilateral 07/10/2012   Procedure: CYSTOSCOPY WITH RETROGRADE PYELOGRAM;  Surgeon: Milford Cage, MD;  Location: [facility];  Service: Urology;  Laterality: Bilateral;   CYSTOSCOPY WITH URETHRAL DILATATION  01/24/2012   Procedure: CYSTOSCOPY WITH URETHRAL DILATATION;  Surgeon: Milford Cage, MD;  [facility];  Service: Urology;  Laterality: N/A;   IR NEPHROSTOMY PLACEMENT LEFT  07/16/2016   LUMBAR LAMINECTOMY  06-02-2005   W/ RESECTION NERVE ROOT  L4  -  L5   NEPHROSTOMY TUBE PLACEMENT ([facility] HX)  2015   right    TRANSURETHRAL RESECTION OF BLADDER TUMOR  12/15/2011   Procedure: TRANSURETHRAL RESECTION OF BLADDER TUMOR (TURBT);  Surgeon: Milford Cage, MD;  [facility];  Service: Urology;  Laterality: N/A;  2 HRS    TRANSURETHRAL RESECTION OF BLADDER TUMOR  01/24/2012   Procedure: TRANSURETHRAL RESECTION OF BLADDER TUMOR (TURBT);  Surgeon: Milford Cage, MD;  Location: [facility];  Service: Urology;  Laterality: N/A;  90 MIN    TRANSURETHRAL RESECTION OF BLADDER TUMOR N/A 07/10/2012   Procedure: TRANSURETHRAL RESECTION OF BLADDER TUMOR (TURBT);  Surgeon: Milford Cage, MD;  Location: [facility];  Service: Urology;  Laterality: N/A;   ulner artery repair Right 2009   rt -trauma /thrombectomy,repair    Prior to Admission medications   Medication Sig Start Date End Date Taking? Authorizing Provider  amLODipine (NORVASC) 10 MG tablet Take 1 tablet (10 mg total) by mouth at bedtime. Do not take on days of dialysis unless BP > 120 mm Hg 06/22/22  Yes Yates Decamp, MD  aspirin 81 MG chewable tablet Chew 1 tablet (81 mg total) by mouth daily. 04/26/22  Yes Leroy Sea, MD  calcium acetate  (PHOSLO) 667 MG capsule Take 1,334 mg by mouth at bedtime. 08/13/21  Yes [provider]  metoprolol tartrate (LOPRESSOR) 25 MG tablet Take 1 tablet (25 mg total) by mouth 2 (two) times daily. Hold on the days of dialysis 06/22/22  Yes Yates Decamp, MD  pantoprazole (PROTONIX) 40 MG tablet Take 80 mg by mouth daily.   Yes [provider]  repaglinide (PRANDIN) 0.5 MG tablet Take by mouth. TAKE 1 TABLET BY MOUTH 3 TIMES DAILY BEFORE MEALS. SKIP DOSE IF NOT EATING. FOR DIABETES   Yes [provider]  rosuvastatin (CRESTOR) 20 MG tablet Take 1 tablet (20 mg total) by mouth daily. 06/22/22  Yes Yates Decamp, MD  sevelamer carbonate (RENVELA) 800 MG tablet Take 800-1,600 mg by mouth See admin instructions. 1600 mg with meals, 800 mg with snacks. 07/27/21  Yes [provider]  venlafaxine XR (EFFEXOR-XR) 150 MG 24 hr capsule Take 1 capsule (150 mg total) by mouth daily with breakfast. Patient taking differently: Take 150 mg by mouth at bedtime. 07/06/17  Yes Jomarie Longs, MD  QUEtiapine (SEROQUEL) 25 MG tablet Take 1 tablet (25 mg total) by mouth at bedtime. Patient not taking: Reported on 05/04/2018 07/06/17 05/27/19  Jomarie Longs, MD    Current Facility-Administered Medications  Medication Dose Route Frequency Provider Last Rate Last Admin   0.9 %  sodium chloride infusion   Intravenous Continuous Shajuana Mclucas, Willaim Rayas, MD        Allergies as of 09/09/2022 - Review Complete 06/22/2022  Allergen Reaction Noted   Lisinopril Cough 05/30/2013   Lisinopril Cough 04/23/2022    Family History  Problem Relation Age of Onset   Heart disease Mother    Diabetes Maternal Grandmother    Brain cancer Paternal Uncle    Hypertension Other    Bladder Cancer Neg Hx    Colon cancer Neg Hx    Esophageal cancer Neg Hx    Rectal cancer Neg Hx    Stomach cancer Neg Hx     Social History   Socioeconomic History   Marital status: Married    Spouse name: [name] [name]   Number of  children: 4   Years of education: Not on file   Highest education level: Associate degree: occupational, Scientist, product/process development, or vocational program  Occupational History   Occupation: self employed    Comment: part time  Tobacco Use   Smoking status: Former    Current packs/day: 0.00    Average packs/day: 1 pack/day for 16.0 years (16.0 ttl pk-yrs)    Types: Cigarettes    Start date: 01/20/1976    Quit date: 01/20/1992    Years since quitting: 30.7   Smokeless tobacco: Former    Types: Chew    Quit date: 01/20/1992  Vaping Use   Vaping status: Never Used  Substance and Sexual Activity   Alcohol use: No    Alcohol/week: 0.0 standard drinks of alcohol  Drug use: No   Sexual activity: Not Currently  Other Topics Concern   Not on file  Social History Narrative   Not on file   Social Determinants of Health   Financial Resource Strain: Low Risk  (07/06/2017)   Overall Financial Resource Strain (CARDIA)    Difficulty of Paying Living Expenses: Not hard at all  Food Insecurity: No Food Insecurity (07/06/2017)   Hunger Vital Sign    Worried About Running Out of Food in the Last Year: Never true    Ran Out of Food in the Last Year: Never true  Transportation Needs: No Transportation Needs (07/06/2017)   PRAPARE - Administrator, Civil Service (Medical): No    Lack of Transportation (Non-Medical): No  Physical Activity: Inactive (07/06/2017)   Exercise Vital Sign    Days of Exercise per Week: 0 days    Minutes of Exercise per Session: 0 min  Stress: No Stress Concern Present (07/06/2017)   [employer] of Occupational Health - Occupational Stress Questionnaire    Feeling of Stress : Not at all  Social Connections: Unknown (08/17/2021)   [facility] from [facility]   Social Network    Social Network: Not on file  Intimate Partner Violence: Unknown (07/08/2021)   Received from [facility]   HITS    Physically Hurt: Not on file    Insult or Talk Down To: Not on file     Threaten Physical Harm: Not on file    Scream or Curse: Not on file    Review of Systems: All other review of systems negative except as mentioned in the HPI.  Physical Exam: Vital signs BP (!) 171/65   Pulse 82   Temp 97.8 F (36.6 C) (Tympanic)   Resp 20   Ht 5\' 9"  (1.753 m)   Wt 90.7 kg   SpO2 97%   BMI 29.53 kg/m   General:   Alert,  Well-developed, pleasant and cooperative in NAD Lungs:  Clear throughout to auscultation.   Heart:  Regular rate and rhythm Abdomen:  Soft, nontender and nondistended.   Neuro/Psych:  Alert and cooperative. Normal mood and affect. A and O x 3  Harlin Rain, MD [facility]

## 2022-10-21 NOTE — Discharge Instructions (Addendum)
YOU HAD AN ENDOSCOPIC PROCEDURE TODAY: Refer to the procedure report and other information in the discharge instructions given to you for any specific questions about what was found during the examination. If this information does not answer your questions, please call [place] office at [phone] to clarify.   YOU SHOULD EXPECT: Some feelings of bloating in the abdomen. Passage of more gas than usual. Walking can help get rid of the air that was put into your GI tract during the procedure and reduce the bloating. If you had a lower endoscopy (such as a colonoscopy or flexible sigmoidoscopy) you may notice spotting of blood in your stool or on the toilet paper. Some abdominal soreness may be present for a day or two, also.  DIET: Your first meal following the procedure should be a light meal and then it is ok to progress to your normal diet. A half-sandwich or bowl of soup is an example of a good first meal. Heavy or fried foods are harder to digest and may make you feel nauseous or bloated. Drink plenty of fluids but you should avoid alcoholic beverages for 24 hours.  ACTIVITY: Your care partner should take you home directly after the procedure. You should plan to take it easy, moving slowly for the rest of the day. You can resume normal activity the day after the procedure however YOU SHOULD NOT DRIVE, use power tools, machinery or perform tasks that involve climbing or major physical exertion for 24 hours (because of the sedation medicines used during the test).   SYMPTOMS TO REPORT IMMEDIATELY: A gastroenterologist can be reached at any hour. Please call [phone]  for any of the following symptoms:  Following lower endoscopy (colonoscopy, flexible sigmoidoscopy) Excessive amounts of blood in the stool  Significant tenderness, worsening of abdominal pains  Swelling of the abdomen that is new, acute  Fever of 100 or higher   FOLLOW UP:  If any biopsies were taken you will be contacted by  phone or by letter within the next 1-3 weeks. Call [phone]  if you have not heard about the biopsies in 3 weeks.  Please also call with any specific questions about appointments or follow up tests. 

## 2022-10-21 NOTE — Op Note (Signed)
[facility] Patient Name: [name] [name] Procedure Date: 10/21/2022 MRN: [id number] Attending MD: Willaim Rayas. Adela Lank , MD, [id number] Date of Birth: [birth year] CSN: [id number] Age: 62 Admit Type: Outpatient Procedure:                Colonoscopy Indications:              High risk colon cancer surveillance: Personal                            history of colonic polyps - last exam 04/2018 - 10                            polyps removed. History of ESRD on HD, case done at                            the hospital for anesthesia support Providers:                Viviann Spare P. Adela Lank, MD, Cephus Richer, RN, Marge Duncans, RN, Kandice Robinsons, Technician Referring MD:              Medicines:                Monitored Anesthesia Care Complications:            No immediate complications. Estimated blood loss:                            Minimal. Estimated Blood Loss:     Estimated blood loss was minimal. Procedure:                Pre-Anesthesia Assessment:                           - Prior to the procedure, a History and Physical                            was performed, and patient medications and                            allergies were reviewed. The patient's tolerance of                            previous anesthesia was also reviewed. The risks                            and benefits of the procedure and the sedation                            options and risks were discussed with the patient.                            All questions were answered, and informed consent  was obtained. Prior Anticoagulants: The patient has                            taken no anticoagulant or antiplatelet agents. ASA                            Grade Assessment: III - A patient with severe                            systemic disease. After reviewing the risks and                            benefits, the patient was deemed in satisfactory                             condition to undergo the procedure.                           After obtaining informed consent, the colonoscope                            was passed under direct vision. Throughout the                            procedure, the patient's blood pressure, pulse, and                            oxygen saturations were monitored continuously. The                            CF-HQ190L ([id number]) Olympus colonoscope was                            introduced through the anus and advanced to the the                            cecum, identified by appendiceal orifice and                            ileocecal valve. The colonoscopy was performed                            without difficulty. The patient tolerated the                            procedure well. The quality of the bowel                            preparation was good. The ileocecal valve,                            appendiceal orifice, and rectum were photographed. Scope In: 7:35:31 AM Scope Out: 8:49:50 AM Scope Withdrawal Time: 1 hour 10 minutes 15 seconds  Total Procedure Duration: 1 hour  14 minutes 19 seconds  Findings:      Skin tags were found on perianal exam.      A 5 mm polyp was found in the ascending colon. The polyp was sessile.       The polyp was removed with a cold snare. Resection and retrieval were       complete.      Two sessile polyps were found in the hepatic flexure. The polyps were 3       to 4 mm in size. These polyps were removed with a cold snare. Resection       and retrieval were complete.      A 5 mm polyp was found in the transverse colon. The polyp was sessile.       The polyp was removed with a cold snare. Resection and retrieval were       complete.      A 12 to 15 mm polyp was found in the transverse colon. The polyp was       semi-pedunculated. The polyp was removed with a hot snare. Resection and       retrieval were complete. Post resection there was immediate bleeding        which appeared arterial (spurting vessel). For hemostasis, several       measures were taken to provide hemostasis. Initially a few clips were       placed at the site which slowed it but did not stop it. Epinephrine       injection was used which helped slow it. Additional clips were placed       across the based - in total seven hemostatic clips were successfully       placed. Area was successfully injected with a total of 5 mL of a 0.1       mg/mL solution of epinephrine for drug delivery. To stop persistent       oozing, hemostatic gel (Purastat) was then deployed. There appeared to       be hemostasis following the combination of these measures.      Internal hemorrhoids were found during retroflexion.      The exam was otherwise without abnormality. Impression:               - Perianal skin tags found on perianal exam.                           - One 5 mm polyp in the ascending colon, removed                            with a cold snare. Resected and retrieved.                           - Two 3 to 4 mm polyps at the hepatic flexure,                            removed with a cold snare. Resected and retrieved.                           - One 5 mm polyp in the transverse colon, removed  with a cold snare. Resected and retrieved.                           - One 12 to 15 mm polyp in the transverse colon,                            removed with a hot snare, complicated by immediate                            bleeding. Resected and retrieved. Clips were                            placed. Injected. hemostatic get applied.                           - Internal hemorrhoids.                           - The examination was otherwise normal.                           Overall, suprisingly significant bleeding that was                            immediate and appeared arterial despite hot snare                            resection. Several minutes taken to provide                             hemostasis. This was a difficult area to visualize                            behind a fold in the transverse colon and                            challenging to achieve hemostasis. Moderate Sedation:      No moderate sedation, case performed with MAC Recommendation:           - Patient has a contact number available for                            emergencies. The signs and symptoms of potential                            delayed complications were discussed with the                            patient. Return to normal activities tomorrow.                            Written discharge instructions were provided to the  patient.                           - Resume previous diet.                           - Continue present medications.                           - Await pathology results.                           - Will monitor in PACU for a bit this morning.                            Expect he will pass some old blood today from                            bleeding during the procedure. If he has symptoms                            of active bleeding will need hospitalization, with                            CTA, possible IR intervention. Procedure Code(s):        --- Professional ---                           [phone], Colonoscopy, flexible; with removal of                            tumor(s), polyp(s), or other lesion(s) by snare                            technique                           45381, Colonoscopy, flexible; with directed                            submucosal injection(s), any substance Diagnosis Code(s):        --- Professional ---                           Z86.010, Personal history of colonic polyps                           D12.2, Benign neoplasm of ascending colon                           D12.3, Benign neoplasm of transverse colon (hepatic                            flexure or splenic flexure)                           K64.8,  Other  hemorrhoids                           K64.4, Residual hemorrhoidal skin tags CPT copyright 2022 American Medical Association. All rights reserved. The codes documented in this report are preliminary and upon coder review may  be revised to meet current compliance requirements. Viviann Spare P. Ericka Marcellus, MD 10/21/2022 9:06:44 AM This report has been signed electronically. Number of Addenda: 0

## 2022-10-22 ENCOUNTER — Encounter (HOSPITAL_COMMUNITY): Payer: Self-pay | Admitting: Gastroenterology

## 2022-10-22 LAB — SURGICAL PATHOLOGY

## 2022-10-25 ENCOUNTER — Encounter: Payer: Self-pay | Admitting: Gastroenterology

## 2022-12-15 ENCOUNTER — Other Ambulatory Visit: Payer: Medicare Other

## 2022-12-23 ENCOUNTER — Ambulatory Visit: Payer: Medicare Other | Admitting: Cardiology

## 2022-12-28 ENCOUNTER — Ambulatory Visit: Payer: Medicare Other | Admitting: Cardiology

## 2023-02-21 ENCOUNTER — Other Ambulatory Visit: Payer: Self-pay

## 2023-02-21 ENCOUNTER — Emergency Department (HOSPITAL_BASED_OUTPATIENT_CLINIC_OR_DEPARTMENT_OTHER): Payer: Medicare Other | Admitting: Radiology

## 2023-02-21 ENCOUNTER — Emergency Department (HOSPITAL_BASED_OUTPATIENT_CLINIC_OR_DEPARTMENT_OTHER)
Admission: EM | Admit: 2023-02-21 | Discharge: 2023-02-21 | Disposition: A | Discharge status: Home / Self Care | Payer: Medicare Other

## 2023-02-21 ENCOUNTER — Emergency Department (HOSPITAL_BASED_OUTPATIENT_CLINIC_OR_DEPARTMENT_OTHER): Payer: Medicare Other

## 2023-02-21 DIAGNOSIS — N186 End stage renal disease: Secondary | ICD-10-CM | POA: Insufficient documentation

## 2023-02-21 DIAGNOSIS — E1122 Type 2 diabetes mellitus with diabetic chronic kidney disease: Secondary | ICD-10-CM | POA: Diagnosis not present

## 2023-02-21 DIAGNOSIS — Z7982 Long term (current) use of aspirin: Secondary | ICD-10-CM | POA: Diagnosis not present

## 2023-02-21 DIAGNOSIS — Z7984 Long term (current) use of oral hypoglycemic drugs: Secondary | ICD-10-CM | POA: Insufficient documentation

## 2023-02-21 DIAGNOSIS — R718 Other abnormality of red blood cells: Secondary | ICD-10-CM | POA: Insufficient documentation

## 2023-02-21 DIAGNOSIS — R000 Tachycardia, unspecified: Secondary | ICD-10-CM | POA: Insufficient documentation

## 2023-02-21 DIAGNOSIS — I509 Heart failure, unspecified: Secondary | ICD-10-CM | POA: Insufficient documentation

## 2023-02-21 DIAGNOSIS — Z992 Dependence on renal dialysis: Secondary | ICD-10-CM | POA: Diagnosis not present

## 2023-02-21 LAB — TROPONIN I (HIGH SENSITIVITY)
Troponin I (High Sensitivity): 58 ng/L — ABNORMAL HIGH (ref <18)
Troponin I (High Sensitivity): 80 ng/L — ABNORMAL HIGH (ref <18)

## 2023-02-21 LAB — CBC
HCT: 36.6 % — ABNORMAL LOW (ref 39.0–52.0)
Hemoglobin: 13.7 g/dL (ref 13.0–17.0)
MCH: 31.9 pg (ref 26.0–34.0)
MCHC: 37.4 g/dL — ABNORMAL HIGH (ref 30.0–36.0)
MCV: 85.1 fL (ref 80.0–100.0)
Platelets: 310 10*3/uL (ref 150–400)
RBC: 4.3 MIL/uL (ref 4.22–5.81)
RDW: 14.5 % (ref 11.5–15.5)
WBC: 9.9 10*3/uL (ref 4.0–10.5)
nRBC: 0 % (ref 0.0–0.2)

## 2023-02-21 LAB — BASIC METABOLIC PANEL
Anion gap: 14 (ref 5–15)
BUN: 27 mg/dL — ABNORMAL HIGH (ref 8–23)
CO2: 28 mmol/L (ref 22–32)
Calcium: 10.3 mg/dL (ref 8.9–10.3)
Chloride: 93 mmol/L — ABNORMAL LOW (ref 98–111)
Creatinine, Ser: 5.27 mg/dL — ABNORMAL HIGH (ref 0.61–1.24)
GFR, Estimated: 11 mL/min — ABNORMAL LOW (ref >60)
Glucose, Bld: 180 mg/dL — ABNORMAL HIGH (ref 70–99)
Potassium: 3.7 mmol/L (ref 3.5–5.1)
Sodium: 135 mmol/L (ref 135–145)

## 2023-02-21 NOTE — ED Triage Notes (Signed)
During dialysis today, patient was tachy in 130s. Received full treatment today. Dialysis on MWF. Reports slight chest tightness during treatment.

## 2023-02-21 NOTE — ED Provider Notes (Signed)
From dialysis for tachycardia. Mild chest tightness, spontaneously resolved. First trop 58, pending 2nd. Baseline low 50's. No SOB. Finished dialysis treatment today.  Physical Exam  BP (!) 167/72   Pulse 94   Temp 98.1 F (36.7 C) (Oral)   Resp 12   Ht 5\' 10"  (1.778 m)   Wt 93 kg   SpO2 97%   BMI 29.41 kg/m   Physical Exam Resting comfortably. No complaints. In NAD  Procedures  Procedures  ED Course / MDM   Clinical Course as of 02/21/23 2247  Mon Feb 21, 2023  2244 Patient care signed out at end of shift by Melina Modena, PA-C, pending delta troponin in evaluation of chest tightness and tachycardia. Troponin went from 58, which is baseline for him, to 80. This was discussed with cardiology who advised that no further emergent work up indicated for likely demand ischemia. Record reviewed: nonischemic stress in March of this year and recent cardiology evaluation for transplant pre-qual done. Approved for transplant. He is stable for discharge home.  [SU]    Clinical Course User Index [SU] Elpidio Anis, PA-C   Medical Decision Making  Plan:  Discharge if trop remains baseline  Dx: chest tightness Demand ischemia with elevated troponin       Elpidio Anis, PA-C 02/21/23 2247    Coral Spikes, DO 02/22/23 0020

## 2023-02-21 NOTE — ED Provider Notes (Signed)
[place] EMERGENCY DEPARTMENT AT [facility] Provider Note   CSN: [id number] Arrival date & time: 02/21/23  1550     History  Chief Complaint  Patient presents with   Tachycardia    [name] [name] is a 62 y.o. male past medical history significant for ESRD, CHF, and elevated troponin presents today after being found to be tacky in the 130s during dialysis.  Patient received full treatment.  Patient receives dialysis Monday Wednesday and Friday.  Patient noted slight chest tightness during treatment which resolved without medication.  HPI     Home Medications Prior to Admission medications   Medication Sig Start Date End Date Taking? Authorizing Provider  amLODipine (NORVASC) 10 MG tablet Take 1 tablet (10 mg total) by mouth at bedtime. Do not take on days of dialysis unless BP > 120 mm Hg 06/22/22   Yates Decamp, MD  aspirin 81 MG chewable tablet Chew 1 tablet (81 mg total) by mouth daily. 04/26/22   Leroy Sea, MD  calcium acetate (PHOSLO) 667 MG capsule Take 1,334 mg by mouth at bedtime. 08/13/21   [provider]  metoprolol tartrate (LOPRESSOR) 25 MG tablet Take 1 tablet (25 mg total) by mouth 2 (two) times daily. Hold on the days of dialysis 06/22/22   Yates Decamp, MD  pantoprazole (PROTONIX) 40 MG tablet Take 80 mg by mouth daily.    [provider]  repaglinide (PRANDIN) 0.5 MG tablet Take by mouth. TAKE 1 TABLET BY MOUTH 3 TIMES DAILY BEFORE MEALS. SKIP DOSE IF NOT EATING. FOR DIABETES    [provider]  rosuvastatin (CRESTOR) 20 MG tablet Take 1 tablet (20 mg total) by mouth daily. 06/22/22   Yates Decamp, MD  sevelamer carbonate (RENVELA) 800 MG tablet Take 800-1,600 mg by mouth See admin instructions. 1600 mg with meals, 800 mg with snacks. 07/27/21   [provider]  venlafaxine XR (EFFEXOR-XR) 150 MG 24 hr capsule Take 1 capsule (150 mg total) by mouth daily with breakfast. Patient taking differently: Take 150 mg by mouth  at bedtime. 07/06/17   Jomarie Longs, MD  QUEtiapine (SEROQUEL) 25 MG tablet Take 1 tablet (25 mg total) by mouth at bedtime. Patient not taking: Reported on 05/04/2018 07/06/17 05/27/19  Jomarie Longs, MD      Allergies    Lisinopril and Lisinopril    Review of Systems   Review of Systems  Physical Exam Updated Vital Signs BP (!) 154/75   Pulse (!) 102   Temp 97.8 F (36.6 C) (Oral)   Resp 14   Ht 5\' 10"  (1.778 m)   Wt 93 kg   SpO2 100%   BMI 29.41 kg/m  Physical Exam  ED Results / Procedures / Treatments   Labs (all labs ordered are listed, but only abnormal results are displayed) Labs Reviewed  BASIC METABOLIC PANEL - Abnormal; Notable for the following components:      Result Value   Chloride 93 (*)    Glucose, Bld 180 (*)    BUN 27 (*)    Creatinine, Ser 5.27 (*)    GFR, Estimated 11 (*)    All other components within normal limits  CBC - Abnormal; Notable for the following components:   HCT 36.6 (*)    MCHC 37.4 (*)    All other components within normal limits  TROPONIN I (HIGH SENSITIVITY) - Abnormal; Notable for the following components:   Troponin I (High Sensitivity) 58 (*)    All other components  within normal limits  TROPONIN I (HIGH SENSITIVITY)    EKG None  Radiology No results found.  Procedures Procedures    Medications Ordered in ED Medications - No data to display  ED Course/ Medical Decision Making/ A&P                                 Medical Decision Making  This patient presents to the ED with chief complaint(s) of tachycardia during dialysis with pertinent past medical history of ESRD, CHF,which further complicates the presenting complaint. The complaint involves an extensive differential diagnosis and also carries with it a high risk of complications and morbidity.    The differential diagnosis includes STEMI, NSTEMI, arrhythmia, electrolyte abnormality  Additional history obtained: Additional history obtained from  family Records reviewed Primary Care Documents  ED Course and Reassessment:   Independent labs interpretation:  The following labs were independently interpreted:  CBC: Mildly decreased HCT, mildly elevated MCHC CMP: Mildly elevated bun, mildly decreased chloride Troponin: 58, EKG: Sinus tach with possible LAE  Independent visualization of imaging: - I independently visualized the following imaging with scope of interpretation limited to determining acute life threatening conditions related to emergency care: Chest x-ray, which revealed pending  Consultation: - Consulted or discussed management/test interpretation w/ external professional: None  Patient signed out to Elpidio Anis, PA-C at shift change pending second troponin and chest x-ray read.  Patient likely to be discharged if these come back normal.        Final Clinical Impression(s) / ED Diagnoses Final diagnoses:  None    Rx / DC Orders ED Discharge Orders     None         Dolphus Jenny, PA-C 02/21/23 1910    Coral Spikes, DO 02/22/23 0013

## 2023-02-21 NOTE — Discharge Instructions (Addendum)
Your evaluation today is reassuring that there is no underlying cardiac event or condition. You can go home and are encouraged to follow up with your cardiologist in the office.   If you have any chest pain or new symptom of concern, please return to the ED at any time.

## 2023-03-16 ENCOUNTER — Other Ambulatory Visit: Payer: Self-pay | Admitting: Cardiology

## 2023-03-16 DIAGNOSIS — I132 Hypertensive heart and chronic kidney disease with heart failure and with stage 5 chronic kidney disease, or end stage renal disease: Secondary | ICD-10-CM

## 2023-03-16 MED ORDER — METOPROLOL TARTRATE 25 MG PO TABS: 25.0000 mg | ORAL_TABLET | Freq: Two times a day (BID) | ORAL | 0 refills | Status: DC

## 2023-04-04 ENCOUNTER — Encounter (HOSPITAL_COMMUNITY): Payer: Self-pay

## 2023-04-22 ENCOUNTER — Encounter: Payer: Self-pay | Admitting: Cardiology

## 2023-05-21 ENCOUNTER — Other Ambulatory Visit: Payer: Self-pay | Admitting: Cardiology

## 2023-05-21 DIAGNOSIS — E782 Mixed hyperlipidemia: Secondary | ICD-10-CM

## 2023-05-21 DIAGNOSIS — I132 Hypertensive heart and chronic kidney disease with heart failure and with stage 5 chronic kidney disease, or end stage renal disease: Secondary | ICD-10-CM

## 2023-06-16 ENCOUNTER — Other Ambulatory Visit: Payer: Self-pay | Admitting: Cardiology

## 2023-06-16 DIAGNOSIS — E782 Mixed hyperlipidemia: Secondary | ICD-10-CM

## 2023-06-16 DIAGNOSIS — I132 Hypertensive heart and chronic kidney disease with heart failure and with stage 5 chronic kidney disease, or end stage renal disease: Secondary | ICD-10-CM

## 2023-06-18 ENCOUNTER — Other Ambulatory Visit: Payer: Self-pay | Admitting: Cardiology

## 2023-06-18 DIAGNOSIS — E782 Mixed hyperlipidemia: Secondary | ICD-10-CM

## 2023-06-18 DIAGNOSIS — I132 Hypertensive heart and chronic kidney disease with heart failure and with stage 5 chronic kidney disease, or end stage renal disease: Secondary | ICD-10-CM

## 2023-07-02 ENCOUNTER — Other Ambulatory Visit: Payer: Self-pay | Admitting: Cardiology

## 2023-07-02 DIAGNOSIS — E782 Mixed hyperlipidemia: Secondary | ICD-10-CM

## 2023-07-17 ENCOUNTER — Other Ambulatory Visit: Payer: Self-pay | Admitting: Cardiology

## 2023-07-17 DIAGNOSIS — I132 Hypertensive heart and chronic kidney disease with heart failure and with stage 5 chronic kidney disease, or end stage renal disease: Secondary | ICD-10-CM

## 2023-07-19 MED ORDER — METOPROLOL TARTRATE 25 MG PO TABS: 25.0000 mg | ORAL_TABLET | Freq: Two times a day (BID) | ORAL | 0 refills | Status: DC

## 2023-07-19 NOTE — Addendum Note (Signed)
 Addended by: Debrah Fan, Derrek Puff L on: 07/19/2023 02:47 PM   Modules accepted: Orders

## 2023-07-24 ENCOUNTER — Other Ambulatory Visit: Payer: Self-pay | Admitting: Cardiology

## 2023-07-24 DIAGNOSIS — E782 Mixed hyperlipidemia: Secondary | ICD-10-CM

## 2023-07-26 NOTE — Telephone Encounter (Signed)
 I placed call to patient to schedule follow up with Dr Berry Bristol.  Left message to call office

## 2023-07-26 NOTE — Telephone Encounter (Signed)
 Dr. Maida Sciara pt. Passed his 3rd attempt. Does Dr. Berry Bristol want to refill? Please advise.

## 2023-07-27 NOTE — Telephone Encounter (Signed)
 Left message on patient's voicemail to call office.  I called [facility] and let them know patient is due for an appointment and he has not called office to schedule this.  Pharmacy will ask patient to call office to schedule appointment if he contacts them regarding refill

## 2023-08-08 ENCOUNTER — Other Ambulatory Visit: Payer: Self-pay

## 2023-08-08 DIAGNOSIS — I132 Hypertensive heart and chronic kidney disease with heart failure and with stage 5 chronic kidney disease, or end stage renal disease: Secondary | ICD-10-CM

## 2023-08-08 MED ORDER — METOPROLOL TARTRATE 25 MG PO TABS: 25.0000 mg | ORAL_TABLET | Freq: Two times a day (BID) | ORAL | 0 refills | Status: AC

## 2023-08-16 NOTE — Telephone Encounter (Signed)
 Left message for patient to call office.

## 2023-08-25 NOTE — Telephone Encounter (Signed)
 Received e prescribing error message that refusal did not go to to pharmacy and to call pharmacy. [facility] notified by phone of prescription refusal.

## 2023-08-25 NOTE — Telephone Encounter (Signed)
 Left message to call office.  Refill refused as we have not been able to reach patient to schedule appointment

## 2023-09-13 ENCOUNTER — Other Ambulatory Visit: Payer: Self-pay | Admitting: Cardiology

## 2023-09-13 DIAGNOSIS — E782 Mixed hyperlipidemia: Secondary | ICD-10-CM

## 2024-01-04 NOTE — Progress Notes (Signed)
 [facility] [place]  Return Patient Visit [name] [name] DOB: November 08, 1960  MRN: [id number] Visit Date: 01/04/2024  Encounter Provider: Laneta Tanda Agent, PA-C  Subjective [name] [name] is a 63 y.o. male who presents for Sciatica (Pt c/o of pain on his left hip that seems like sciatica pain x's several months ) History of Present Illness The patient is a 63 year old male who presents for evaluation of left hip pain.  He underwent surgery on his left hip approximately 20 to 30 years ago due to arthritis and a birth defect. Patient states the procedure involved accessing the hip through his back. Post-surgery, he was able to walk and stand without issues for several years. However, in recent months, he has been experiencing a dull, persistent pain in his left hip, which he describes as akin to being stabbed in the side. He reports no preceding injury or trauma. The pain is constant, even upon waking up, and does not radiate. He reports no numbness or tingling in his leg but notes weakness, particularly when ascending stairs or climbing into a large truck, necessitating the use of his right leg. Walking exacerbates the pain, limiting his ability to stand or walk for extended periods. Sitting with his leg crossed and stretching provides some relief. He has tried using muscle cream and Aleve, but these have not alleviated the pain. He also takes baby aspirin  daily.  He has a history of gout in his knees, which was managed with cortisone injections. However, following bladder cancer treatment that included bladder removal and reconstruction, he experienced kidney failure. This led to the removal of his left kidney three years later. Since then, he has not had any gout attacks.  PAST SURGICAL HISTORY: - Left hip surgery for arthritis and birth defect (20-30 years ago) - Bladder removal and reconstruction - Left kidney removal   Behavioral Health Screening  Patient Health  Questionnaire-2 Score: 0 (01/04/2024  4:20 PM)      Patient's Depression screening is Negative   Depression Plan: Normal/Negative Screening  Objective Blood pressure 140/66, pulse (!) 122, temperature 97.9 F (36.6 C), weight 90.2 kg (198 lb 12.8 oz), SpO2 98%. Physical Exam Respiratory: Clear to auscultation, no wheezing, rales or rhonchi Musculoskeletal: Pain elicited upon movement of the left hip. No tenderness to palpation of the left hip.  Physical Exam Constitutional:      General: He is not in acute distress.    Appearance: Normal appearance. He is not ill-appearing or toxic-appearing.  HENT:     Head: Normocephalic and atraumatic.     Right Ear: External ear normal.     Left Ear: External ear normal.     Nose: Nose normal.  Eyes:     General:        Right eye: No discharge.        Left eye: No discharge.  Cardiovascular:     Rate and Rhythm: Normal rate and regular rhythm.     Heart sounds: Normal heart sounds. No murmur heard.    No friction rub. No gallop.  Pulmonary:     Effort: Pulmonary effort is normal. No respiratory distress.     Breath sounds: Normal breath sounds. No stridor. No wheezing, rhonchi or rales.  Musculoskeletal:        General: No swelling, tenderness, deformity or signs of injury. Normal range of motion.     Cervical back: Normal range of motion.     Right lower leg: No edema.  Left lower leg: No edema.     Comments: No tenderness to palpation over hip joint; normal external and internal rotation of hip; normal sensation; strength intact  Skin:    General: Skin is warm and dry.     Capillary Refill: Capillary refill takes less than 2 seconds.  Neurological:     General: No focal deficit present.     Mental Status: He is alert and oriented to person, place, and time. Mental status is at baseline.  Psychiatric:        Mood and Affect: Mood normal.        Behavior: Behavior normal.        Thought Content: Thought content normal.         Judgment: Judgment normal.     Medical History: Medical History[1]  Patient Active Problem List   Diagnosis Date Noted  . Pre-transplant evaluation for end stage renal disease 11/09/2022  . Mixed dyslipidemia 11/09/2022  . Abnormal cardiovascular stress test 11/09/2022  . Diabetes mellitus, type II 06/25/2022  . Uremia 08/25/2020  . Hypertriglyceridemia 02/27/2019  . Non-compliance with renal dialysis 01/04/2018  . Prolonged QT interval 09/14/2017  . Chronic idiopathic gout of multiple sites 08/16/2017  . Gastroesophageal reflux disease 11/16/2016  . Anemia in chronic kidney disease 08/12/2016  . Secondary hyperparathyroidism of renal origin (HCC) 08/12/2016  . CKD (chronic kidney disease) 08/12/2016  . Type 2 diabetes mellitus with other diabetic kidney complication (HCC) 08/12/2016  . Dependence on renal dialysis (HCC) 08/12/2016  . End stage renal disease (HCC) 08/11/2016  . Gastroesophageal reflux disease with esophagitis without hemorrhage 07/16/2015  . Recurrent major depressive disorder, in full remission (HCC) 07/16/2015  . Diabetes mellitus type 2, diet-controlled (HCC) 07/05/2014  . Hypertension 12/18/2012  . History of bladder cancer 08/18/2012  . Renal stone 08/06/2012  . Renal cyst 08/06/2012  . Atherosclerosis of native arteries of extremity with rest pain (CMD) 08/03/2007  . Ataxia, late effect of cerebrovascular disease 10/27/2005   Current Medications:  Medications Ordered Prior to Encounter[2] Current Medications[3]  Allergies: Allergies[4]   Immunizations:  Immunization History  Administered Date(s) Administered  . BCG 08/08/2012  . Hep B, Adolescent or Pediatric 10/04/2016, 11/06/2016, 12/06/2016, 04/08/2017, 09/16/2017, 10/24/2017, 11/25/2017, 03/21/2018  . Hep B-CpG (HEPLISAV-B) 18Y+, non-pregnant 03/26/2022, 01/12/2023, 02/16/2023, 03/16/2023, 05/11/2023  . Hepatitis A 06/30/2023  . Influenza, Injectable, Quadrivalent, Preservative Free 01/23/2021   . Influenza, Unspecified 12/23/2017, 01/02/2019  . Influenza, split virus, trivalent, preservative 03/09/2011  . Pneumococcal Conjugate 13-Valent 11/22/2016  . Pneumococcal Conjugate Vaccine 20-Valent (PREVNAR-20) 6 wks+ 10/29/2022  . Pneumococcal Polysaccharide Vaccine, 23 Valent (PNEUMOVAX-23) 2Y+ 01/24/2017  . Pneumococcal, Unspecified 03/16/2016, 01/24/2017  . TD vaccine (ADULT)PF(TENIVAC) 7Y+ 07/22/2011  . TDAP VACCINE (BOOSTRIX,ADACEL) 7Y+ 11/17/2005, 05/31/2015  . Varicella Zoster [facility]) 18Y+ 06/30/2023  . mdfluenza, MDCK, trivalent, PF 02/02/2023    Surgical History- Surgical History[5]  Family history- Family History[6] Social history- Social History[7]   Labs: No results found for this or any previous visit (from the past week).    Assessment & Plan 1. Left hip pain (Primary) - The patient's left hip pain is described as dull and stabbing, with no specific time of day for improvement or worsening. It does not radiate and is constant. - Physical exam findings include pain upon movement of the hip, but no tenderness to touch. There is associated weakness in the left leg, particularly when ascending stairs or getting into a truck. - An orthopedic referral to [facility] [place] has been  initiated for further evaluation and management. The patient has been counseled to avoid NSAIDs due to his single kidney status. - Tylenol  has been recommended for pain management, to be taken every 6 hours. A prescription for diclofenac gel has been sent to the pharmacy, which can be applied up to 4 times daily on the affected hip. - Ambulatory referral to Orthopaedic Surgery; Future - diclofenac sodium (VOLTAREN) 1 % gel; Apply 1 g topically 4 (four) times a day.  Dispense: 100 g; Refill: 1     Problem List Items Addressed This Visit   None Visit Diagnoses       Left hip pain    -  Primary   Relevant Medications   diclofenac sodium (VOLTAREN) 1 % gel   Other Relevant Orders    Ambulatory referral to Orthopaedic Surgery       Monitor: The problem is newly identified Evaluation: No labs/tests required today Assessment/Treatment PRN  Please contact my office for worsening conditions or problems, and seek emergency medical treatment and/or call 911 if you or your family deems either necessary.  Patient Instructions  Referral placed to [facility]-- if no call in the next 1-2 weeks, please call their office at [phone] to schedule. Avoid NSAIDs like ibuprofen/naproxen. Use acetaminophen -containing products and can use topical diclofenac gel that I prescribed as well.  Return if symptoms worsen or fail to improve.  Portions of this note were created using [employer] Copilot software. Although proofread, errors may be present.  Natalie Wilson James, PA-C 4:47 PM      [1] Past Medical History: Diagnosis Date  . Abnormal EKG 09/14/2017  . Ataxia   . Cellulitis and abscess of trunk   . Chronic idiopathic gout of multiple sites 08/16/2017  . Depressive disorder 08/2005  . Diabetes mellitus, type 2 08/06/2012  . Embolism and thrombosis of arteries of upper extremity    (CMD) 12/08/2007  . Facial weakness   . Gout   . History of suicidal ideation 11/24/2016  . Hyperlipidemia   . Hypertension   . Kidney stone   . Obesity   . Open wound of finger(s) , complicated   . Sebaceous cyst   . Transient alteration of awareness   [2] Current Outpatient Medications on File Prior to Visit  Medication Sig Dispense Refill  . amLODIPine  (NORVASC ) 10 mg tablet Take 10 mg by mouth nightly.    . aspirin  81 mg chewable tablet Take 81 mg by mouth Once Daily.    . calcium  acetate (PHOSLO ) 667 mg (169 mg calcium ) capsule Take 667 mg by mouth.    . metoprolol  tartrate (LOPRESSOR ) 25 mg tablet TAKE 1 TABLET (25 MG TOTAL) BY MOUTH DAILY AT 6PM. 90 tablet 0  . pantoprazole  (PROTONIX ) 40 mg EC tablet TAKE 1 TABLET BY MOUTH 2 TIMES DAILY. PT NEEDS TO ESTABLISH NEW PCP, NEED OFFICE  VISIT 180 tablet 0  . rosuvastatin  (CRESTOR ) 20 mg tablet Take 20 mg by mouth daily.    . venlafaxine  (EFFEXOR  XR) 150 mg 24 hr capsule Take 1 capsule (150 mg total) by mouth daily. 90 capsule 0  . famotidine  (PEPCID ) 10 mg tablet Take one tablet (10 mg total) by mouth at bedtime. 90 tablet 0   No current facility-administered medications on file prior to visit.  [3]  Current Outpatient Medications:  .  amLODIPine  (NORVASC ) 10 mg tablet, Take 10 mg by mouth nightly., Disp: , Rfl:  .  aspirin  81 mg chewable tablet, Take 81 mg by  mouth Once Daily., Disp: , Rfl:  .  calcium  acetate (PHOSLO ) 667 mg (169 mg calcium ) capsule, Take 667 mg by mouth., Disp: , Rfl:  .  metoprolol  tartrate (LOPRESSOR ) 25 mg tablet, TAKE 1 TABLET (25 MG TOTAL) BY MOUTH DAILY AT 6PM., Disp: 90 tablet, Rfl: 0 .  pantoprazole  (PROTONIX ) 40 mg EC tablet, TAKE 1 TABLET BY MOUTH 2 TIMES DAILY. PT NEEDS TO ESTABLISH NEW PCP, NEED OFFICE VISIT, Disp: 180 tablet, Rfl: 0 .  rosuvastatin  (CRESTOR ) 20 mg tablet, Take 20 mg by mouth daily., Disp: , Rfl:  .  venlafaxine  (EFFEXOR  XR) 150 mg 24 hr capsule, Take 1 capsule (150 mg total) by mouth daily., Disp: 90 capsule, Rfl: 0 .  diclofenac sodium (VOLTAREN) 1 % gel, Apply 1 g topically 4 (four) times a day., Disp: 100 g, Rfl: 1 .  famotidine  (PEPCID ) 10 mg tablet, Take one tablet (10 mg total) by mouth at bedtime., Disp: 90 tablet, Rfl: 0 [4] Allergies Allergen Reactions  . Lisinopril Cough  [5] Past Surgical History: Procedure Laterality Date  . BACK SURGERY  05/2005   Procedure: BACK SURGERY  . BLADDER SURGERY     Procedure: BLADDER SURGERY  . COLONOSCOPY  09/03/2015   Procedure: COLONOSCOPY; Dr. Kristie- Repeat in 3 months  . CYSTOSCOPY     Procedure: CYSTOSCOPY  . KIDNEY STONE SURGERY     Procedure: KIDNEY STONE SURGERY  . LITHOTRIPSY     Procedure: LITHOTRIPSY  . TRANSURETHRAL RESECTION OF BLADDER TUMOR N/A 10/13/2012   Procedure: TRANSURETHRAL RESECTION BLADDER TUMOR;   Surgeon: Ranell Von Constable, MD;  Location: [facility] OUTPATIENT OR;  Service: Urology;  Laterality: N/A;  small  . WRIST SURGERY Right 08/2007   Procedure: WRIST SURGERY; torn artery w/arterial blockage  [6] Family History Problem Relation Name Age of Onset  . Heart disease Mother    [7] Social History Socioeconomic History  . Marital status: Married  Tobacco Use  . Smoking status: Some Days    Current packs/day: 0.25    Average packs/day: 0.3 packs/day for 0.7 years (0.2 ttl pk-yrs)    Types: Cigarettes    Start date: 2025    Last attempt to quit: 07/25/1997  . Smokeless tobacco: Former  Advertising account planner  . Vaping status: Never Used  Substance and Sexual Activity  . Alcohol use: No  . Drug use: No   Social Drivers of Health   Food Insecurity: Low Risk  (08/16/2023)   Food vital sign   . Within the past 12 months, you worried that your food would run out before you got money to buy more: Never true   . Within the past 12 months, the food you bought just didn't last and you didn't have money to get more: Never true  Transportation Needs: No Transportation Needs (08/16/2023)   Transportation   . In the past 12 months, has lack of reliable transportation kept you from medical appointments, meetings, work or from getting things needed for daily living? : No  Safety: Low Risk  (08/16/2023)   Safety   . How often does anyone, including family and friends, physically hurt you?: Never   . How often does anyone, including family and friends, insult or talk down to you?: Never   . How often does anyone, including family and friends, threaten you with harm?: Never   . How often does anyone, including family and friends, scream or curse at you?: Never  Living Situation: Low Risk  (08/16/2023)   Living Situation   .  What is your living situation today?: I have a steady place to live   . Think about the place you live. Do you have problems with any of the following? Choose all that apply:: None/None on  this list

## 2024-01-25 ENCOUNTER — Encounter (HOSPITAL_BASED_OUTPATIENT_CLINIC_OR_DEPARTMENT_OTHER): Payer: Self-pay

## 2024-01-25 ENCOUNTER — Observation Stay (HOSPITAL_BASED_OUTPATIENT_CLINIC_OR_DEPARTMENT_OTHER): Admission: EM | Admit: 2024-01-25 | Discharge: 2024-01-27 | Disposition: A | Discharge status: Home / Self Care

## 2024-01-25 ENCOUNTER — Encounter (HOSPITAL_COMMUNITY): Payer: Self-pay

## 2024-01-25 ENCOUNTER — Other Ambulatory Visit: Payer: Self-pay

## 2024-01-25 DIAGNOSIS — K219 Gastro-esophageal reflux disease without esophagitis: Secondary | ICD-10-CM | POA: Diagnosis not present

## 2024-01-25 DIAGNOSIS — N186 End stage renal disease: Secondary | ICD-10-CM | POA: Diagnosis not present

## 2024-01-25 DIAGNOSIS — R7989 Other specified abnormal findings of blood chemistry: Secondary | ICD-10-CM | POA: Diagnosis present

## 2024-01-25 DIAGNOSIS — Z794 Long term (current) use of insulin: Secondary | ICD-10-CM | POA: Insufficient documentation

## 2024-01-25 DIAGNOSIS — I120 Hypertensive chronic kidney disease with stage 5 chronic kidney disease or end stage renal disease: Secondary | ICD-10-CM | POA: Insufficient documentation

## 2024-01-25 DIAGNOSIS — E1122 Type 2 diabetes mellitus with diabetic chronic kidney disease: Secondary | ICD-10-CM | POA: Diagnosis not present

## 2024-01-25 DIAGNOSIS — D649 Anemia, unspecified: Secondary | ICD-10-CM | POA: Diagnosis not present

## 2024-01-25 DIAGNOSIS — D62 Acute posthemorrhagic anemia: Secondary | ICD-10-CM | POA: Insufficient documentation

## 2024-01-25 DIAGNOSIS — Z992 Dependence on renal dialysis: Secondary | ICD-10-CM | POA: Insufficient documentation

## 2024-01-25 DIAGNOSIS — F39 Unspecified mood [affective] disorder: Secondary | ICD-10-CM | POA: Insufficient documentation

## 2024-01-25 DIAGNOSIS — Z8673 Personal history of transient ischemic attack (TIA), and cerebral infarction without residual deficits: Secondary | ICD-10-CM | POA: Diagnosis not present

## 2024-01-25 DIAGNOSIS — N2581 Secondary hyperparathyroidism of renal origin: Secondary | ICD-10-CM | POA: Insufficient documentation

## 2024-01-25 DIAGNOSIS — Z79899 Other long term (current) drug therapy: Secondary | ICD-10-CM | POA: Insufficient documentation

## 2024-01-25 DIAGNOSIS — Z7982 Long term (current) use of aspirin: Secondary | ICD-10-CM | POA: Insufficient documentation

## 2024-01-25 DIAGNOSIS — D631 Anemia in chronic kidney disease: Secondary | ICD-10-CM | POA: Insufficient documentation

## 2024-01-25 DIAGNOSIS — E875 Hyperkalemia: Secondary | ICD-10-CM | POA: Diagnosis present

## 2024-01-25 DIAGNOSIS — E538 Deficiency of other specified B group vitamins: Secondary | ICD-10-CM | POA: Diagnosis present

## 2024-01-25 DIAGNOSIS — E119 Type 2 diabetes mellitus without complications: Secondary | ICD-10-CM

## 2024-01-25 DIAGNOSIS — E785 Hyperlipidemia, unspecified: Secondary | ICD-10-CM | POA: Insufficient documentation

## 2024-01-25 LAB — BASIC METABOLIC PANEL WITH GFR
Anion gap: 18 — ABNORMAL HIGH (ref 5–15)
BUN: 52 mg/dL — ABNORMAL HIGH (ref 8–23)
CO2: 25 mmol/L (ref 22–32)
Calcium: 9.8 mg/dL (ref 8.9–10.3)
Chloride: 92 mmol/L — ABNORMAL LOW (ref 98–111)
Creatinine, Ser: 8.44 mg/dL — ABNORMAL HIGH (ref 0.61–1.24)
GFR, Estimated: 6 mL/min — ABNORMAL LOW (ref >60)
Glucose, Bld: 250 mg/dL — ABNORMAL HIGH (ref 70–99)
Potassium: 5 mmol/L (ref 3.5–5.1)
Sodium: 135 mmol/L (ref 135–145)

## 2024-01-25 LAB — CBC WITH DIFFERENTIAL/PLATELET
Abs Immature Granulocytes: 0.15 K/uL — ABNORMAL HIGH (ref 0.00–0.07)
Basophils Absolute: 0 K/uL (ref 0.0–0.1)
Basophils Relative: 0 %
Eosinophils Absolute: 0.1 K/uL (ref 0.0–0.5)
Eosinophils Relative: 1 %
HCT: 19.3 % — ABNORMAL LOW (ref 39.0–52.0)
Hemoglobin: 6.6 g/dL — CL (ref 13.0–17.0)
Immature Granulocytes: 2 %
Lymphocytes Relative: 20 %
Lymphs Abs: 1.7 K/uL (ref 0.7–4.0)
MCH: 32 pg (ref 26.0–34.0)
MCHC: 34.2 g/dL (ref 30.0–36.0)
MCV: 93.7 fL (ref 80.0–100.0)
Monocytes Absolute: 0.6 K/uL (ref 0.1–1.0)
Monocytes Relative: 7 %
Neutro Abs: 5.9 K/uL (ref 1.7–7.7)
Neutrophils Relative %: 70 %
Platelets: 257 K/uL (ref 150–400)
RBC: 2.06 MIL/uL — ABNORMAL LOW (ref 4.22–5.81)
RDW: 15.9 % — ABNORMAL HIGH (ref 11.5–15.5)
WBC: 8.4 K/uL (ref 4.0–10.5)
nRBC: 0.5 % — ABNORMAL HIGH (ref 0.0–0.2)

## 2024-01-25 NOTE — ED Provider Notes (Signed)
 [place] EMERGENCY DEPARTMENT AT [facility] Provider Note   CSN: [id number] Arrival date & time: 01/25/24  2037     Patient presents with: Abnormal Lab   [name] [name] is a 63 y.o. male.   63 yo M with a chief complaints of the low hemoglobin.  The patient does at home dialysis and typically has blood sent off to his nephrology center for evaluation.  Was told his blood was less than 7 and told to come to the ED for evaluation.  He unfortunately has had bladder cancer and has had his bladder removed and has a neobladder.  He says sometimes when he urinates it looks like diarrhea.  He denies blood in his stool denies dark stool denies obvious blood in the urine.  He did have an episode where he was getting dialysis and there was some malfunction and he was not able to recycle his blood and so what ever was in the machine had been lost.  He does tell me that his hemoglobin has been trending down over the past few months and he received multiple injections to try to raise his blood level.   Abnormal Lab      Prior to Admission medications   Medication Sig Start Date End Date Taking? Authorizing Provider  amLODipine  (NORVASC ) 10 MG tablet Take 1 tablet (10 mg total) by mouth at bedtime. Do not take on days of dialysis unless BP > 120 mm Hg 06/22/22   Ladona Heinz, MD  aspirin  81 MG chewable tablet Chew 1 tablet (81 mg total) by mouth daily. 04/26/22   Singh, Prashant K, MD  calcium  acetate (PHOSLO ) 667 MG capsule Take 1,334 mg by mouth at bedtime. 08/13/21   [provider]  metoprolol  tartrate (LOPRESSOR ) 25 MG tablet Take 1 tablet (25 mg total) by mouth 2 (two) times daily. Hold on the days of dialysis 08/08/23   Ladona Heinz, MD  pantoprazole  (PROTONIX ) 40 MG tablet Take 80 mg by mouth daily.    [provider]  repaglinide (PRANDIN) 0.5 MG tablet Take by mouth. TAKE 1 TABLET BY MOUTH 3 TIMES DAILY BEFORE MEALS. SKIP DOSE IF NOT EATING. FOR DIABETES     [provider]  rosuvastatin  (CRESTOR ) 20 MG tablet Take 1 tablet (20 mg total) by mouth daily. Please call office to schedule an appt for further refills. Thank you 07/04/23   Ladona Heinz, MD  sevelamer  carbonate (RENVELA ) 800 MG tablet Take 800-1,600 mg by mouth See admin instructions. 1600 mg with meals, 800 mg with snacks. 07/27/21   [provider]  venlafaxine  XR (EFFEXOR -XR) 150 MG 24 hr capsule Take 1 capsule (150 mg total) by mouth daily with breakfast. Patient taking differently: Take 150 mg by mouth at bedtime. 07/06/17   Eappen, Saramma, MD  QUEtiapine  (SEROQUEL ) 25 MG tablet Take 1 tablet (25 mg total) by mouth at bedtime. Patient not taking: Reported on 05/04/2018 07/06/17 05/27/19  Eappen, Saramma, MD    Allergies: Lisinopril and Lisinopril    Review of Systems  Updated Vital Signs BP (!) 143/75 (BP Location: Right Arm)   Pulse 98   Temp 98.1 F (36.7 C)   Resp 18   Ht 5' 10 (1.778 m)   Wt 90.7 kg   SpO2 100%   BMI 28.70 kg/m   Physical Exam Vitals and nursing note reviewed.  Constitutional:      Appearance: He is well-developed.  HENT:     Head: Normocephalic and atraumatic.  Eyes:  Pupils: Pupils are equal, round, and reactive to light.  Neck:     Vascular: No JVD.  Cardiovascular:     Rate and Rhythm: Normal rate and regular rhythm.     Heart sounds: No murmur heard.    No friction rub. No gallop.  Pulmonary:     Effort: No respiratory distress.     Breath sounds: No wheezing.  Abdominal:     General: There is no distension.     Tenderness: There is no abdominal tenderness. There is no guarding or rebound.  Musculoskeletal:        General: Normal range of motion.     Cervical back: Normal range of motion and neck supple.  Skin:    Coloration: Skin is not pale.     Findings: No rash.  Neurological:     Mental Status: He is alert and oriented to person, place, and time.  Psychiatric:        Behavior: Behavior normal.     (all  labs ordered are listed, but only abnormal results are displayed) Labs Reviewed  CBC WITH DIFFERENTIAL/PLATELET - Abnormal; Notable for the following components:      Result Value   RBC 2.06 (*)    Hemoglobin 6.6 (*)    HCT 19.3 (*)    RDW 15.9 (*)    nRBC 0.5 (*)    Abs Immature Granulocytes 0.15 (*)    All other components within normal limits  BASIC METABOLIC PANEL WITH GFR - Abnormal; Notable for the following components:   Chloride 92 (*)    Glucose, Bld 250 (*)    BUN 52 (*)    Creatinine, Ser 8.44 (*)    GFR, Estimated 6 (*)    Anion gap 18 (*)    All other components within normal limits    EKG: None  Radiology: No results found.   .Critical Care  Performed by: Emil Share, DO Authorized by: Emil Share, DO   Critical care provider statement:    Critical care time (minutes):  35   Critical care time was exclusive of:  Separately billable procedures and treating other patients   Critical care was time spent personally by me on the following activities:  Development of treatment plan with patient or surrogate, discussions with consultants, evaluation of patient's response to treatment, examination of patient, ordering and review of laboratory studies, ordering and review of radiographic studies, ordering and performing treatments and interventions, pulse oximetry, re-evaluation of patient's condition and review of old charts   Care discussed with: admitting provider      Medications Ordered in the ED - No data to display                                  Medical Decision Making Amount and/or Complexity of Data Reviewed Labs: ordered.   63 yo M with a chief complaints of acute anemia.  It sounds like the patient has had a downward trending hemoglobin since it was last checked in May.  Says he gets done monthly by his nephrology group.  He does home dialysis.  He denies bleeding anywhere.  He says he feels bad all the time but not worse now than he normally  does.  Patient's hemoglobin 6.6.  Potassium is normal.  Anion gap may be due to uremia.  I discussed results with the patient.  Discussed need for admission for blood transfusion.  Patient is  declining rectal exam. Will discuss with medicine.  The patients results and plan were reviewed and discussed.   Any x-rays performed were independently reviewed by myself.   Differential diagnosis were considered with the presenting HPI.  Medications - No data to display  Vitals:   01/25/24 2045 01/25/24 2046  BP:  (!) 143/75  Pulse:  98  Resp:  18  Temp:  98.1 F (36.7 C)  SpO2:  100%  Weight: 90.7 kg   Height: 5' 10 (1.778 m)     Final diagnoses:  Acute anemia    Admission/ observation were discussed with the admitting physician, patient and/or family and they are comfortable with the plan.      Final diagnoses:  Acute anemia    ED Discharge Orders     None          Emil Share, DO 01/25/24 2316

## 2024-01-25 NOTE — ED Triage Notes (Signed)
 Pt performs home dialysis. Pt was told to come to ED for hemoglobin below 7.0. Pt reports increased fatigue and weakness.

## 2024-01-25 NOTE — Plan of Care (Signed)
 Plan of Care Note for accepted transfer   Patient name: [name] [name] FMW:[id number] DOB: [birth year]  Facility requesting transfer: [facility] ED Requesting Provider: Dr. Roselyn Facility course: 63 year old male with history of ESRD on home dialysis, anemia, bladder cancer, anxiety, depression, type 2 diabetes, GERD, gout, hypertension, hyperlipidemia presented to the ED for evaluation of worsening anemia on outpatient labs.  Patient denied any symptoms of GI/GU bleeding and declined rectal exam.  Hemodynamically stable.  Hemoglobin 6.6 (previously 13.7 in November 2024).  Needs blood transfusion.  Plan of care: The patient is accepted for admission to Progressive unit at [facility].  [facility] will assume care on arrival to accepting facility. Until arrival, care as per EDP. However, [facility] available 24/7 for questions and assistance.  Check [email or web address] for on-call coverage.  Nursing staff, please call [facility] Admits & Consults System-Wide number under Amion on patient's arrival so appropriate admitting provider can evaluate the pt.

## 2024-01-26 DIAGNOSIS — Z743 Need for continuous supervision: Secondary | ICD-10-CM | POA: Diagnosis not present

## 2024-01-26 DIAGNOSIS — D631 Anemia in chronic kidney disease: Secondary | ICD-10-CM

## 2024-01-26 DIAGNOSIS — D649 Anemia, unspecified: Secondary | ICD-10-CM | POA: Diagnosis present

## 2024-01-26 DIAGNOSIS — N186 End stage renal disease: Secondary | ICD-10-CM

## 2024-01-26 DIAGNOSIS — Z992 Dependence on renal dialysis: Secondary | ICD-10-CM

## 2024-01-26 LAB — PHOSPHORUS: Phosphorus: 4.4 mg/dL (ref 2.5–4.6)

## 2024-01-26 LAB — BASIC METABOLIC PANEL WITH GFR
Anion gap: 16 — ABNORMAL HIGH (ref 5–15)
BUN: 57 mg/dL — ABNORMAL HIGH (ref 8–23)
CO2: 23 mmol/L (ref 22–32)
Calcium: 9.1 mg/dL (ref 8.9–10.3)
Chloride: 94 mmol/L — ABNORMAL LOW (ref 98–111)
Creatinine, Ser: 9 mg/dL — ABNORMAL HIGH (ref 0.61–1.24)
GFR, Estimated: 6 mL/min — ABNORMAL LOW (ref >60)
Glucose, Bld: 182 mg/dL — ABNORMAL HIGH (ref 70–99)
Potassium: 5.9 mmol/L — ABNORMAL HIGH (ref 3.5–5.1)
Sodium: 133 mmol/L — ABNORMAL LOW (ref 135–145)

## 2024-01-26 LAB — FERRITIN: Ferritin: 1986 ng/mL — ABNORMAL HIGH (ref 24–336)

## 2024-01-26 LAB — HEMOGLOBIN AND HEMATOCRIT, BLOOD
HCT: 23.8 % — ABNORMAL LOW (ref 39.0–52.0)
Hemoglobin: 8.2 g/dL — ABNORMAL LOW (ref 13.0–17.0)

## 2024-01-26 LAB — CBC
HCT: 17.3 % — ABNORMAL LOW (ref 39.0–52.0)
Hemoglobin: 5.9 g/dL — CL (ref 13.0–17.0)
MCH: 31.9 pg (ref 26.0–34.0)
MCHC: 34.1 g/dL (ref 30.0–36.0)
MCV: 93.5 fL (ref 80.0–100.0)
Platelets: 206 K/uL (ref 150–400)
RBC: 1.85 MIL/uL — ABNORMAL LOW (ref 4.22–5.81)
RDW: 16.1 % — ABNORMAL HIGH (ref 11.5–15.5)
WBC: 7.4 K/uL (ref 4.0–10.5)
nRBC: 0.5 % — ABNORMAL HIGH (ref 0.0–0.2)

## 2024-01-26 LAB — FOLATE: Folate: 9.6 ng/mL (ref >5.9)

## 2024-01-26 LAB — ABO/RH: ABO/RH(D): A POS

## 2024-01-26 LAB — MAGNESIUM: Magnesium: 1.7 mg/dL (ref 1.7–2.4)

## 2024-01-26 LAB — VITAMIN B12: Vitamin B-12: 155 pg/mL — ABNORMAL LOW (ref 180–914)

## 2024-01-26 LAB — IRON AND TIBC
Iron: 90 ug/dL (ref 45–182)
Saturation Ratios: 37 % (ref 17.9–39.5)
TIBC: 246 ug/dL — ABNORMAL LOW (ref 250–450)
UIBC: 156 ug/dL

## 2024-01-26 LAB — HEMOGLOBIN A1C
Hgb A1c MFr Bld: 7.1 % — ABNORMAL HIGH (ref 4.8–5.6)
Mean Plasma Glucose: 157.07 mg/dL

## 2024-01-26 LAB — HEPATITIS B SURFACE ANTIGEN: Hepatitis B Surface Ag: NONREACTIVE

## 2024-01-26 LAB — PREPARE RBC (CROSSMATCH)

## 2024-01-26 LAB — TRANSFERRIN: Transferrin: 173 mg/dL — ABNORMAL LOW (ref 180–329)

## 2024-01-26 MED ORDER — VENLAFAXINE HCL ER 75 MG PO CP24
150.0000 mg | ORAL_CAPSULE | Freq: Every day | ORAL | Status: DC
  Administered 2024-01-27: 150 mg via ORAL
  Filled 2024-01-26: qty 2

## 2024-01-26 MED ORDER — CHLORHEXIDINE GLUCONATE CLOTH 2 % EX PADS
6.0000 | MEDICATED_PAD | Freq: Every day | CUTANEOUS | Status: DC
  Administered 2024-01-27: 6 via TOPICAL

## 2024-01-26 MED ORDER — ALBUTEROL SULFATE (2.5 MG/3ML) 0.083% IN NEBU: 2.5000 mg | INHALATION_SOLUTION | RESPIRATORY_TRACT | Status: DC | PRN

## 2024-01-26 MED ORDER — ONDANSETRON HCL 4 MG/2ML IJ SOLN: 4.0000 mg | Freq: Four times a day (QID) | INTRAMUSCULAR | Status: DC | PRN

## 2024-01-26 MED ORDER — AMLODIPINE BESYLATE 10 MG PO TABS
10.0000 mg | ORAL_TABLET | ORAL | Status: DC
  Administered 2024-01-26: 10 mg via ORAL
  Filled 2024-01-26: qty 1

## 2024-01-26 MED ORDER — ACETAMINOPHEN 500 MG PO TABS
1000.0000 mg | ORAL_TABLET | Freq: Four times a day (QID) | ORAL | Status: DC | PRN
Start: 2024-01-26 — End: 2024-01-27

## 2024-01-26 MED ORDER — SODIUM CHLORIDE 0.9% FLUSH
3.0000 mL | Freq: Two times a day (BID) | INTRAVENOUS | Status: DC
  Administered 2024-01-26 – 2024-01-27 (×3): 3 mL via INTRAVENOUS

## 2024-01-26 MED ORDER — POLYETHYLENE GLYCOL 3350 17 G PO PACK
17.0000 g | PACK | Freq: Every day | ORAL | Status: DC | PRN
  Filled 2024-01-26: qty 1

## 2024-01-26 MED ORDER — PANTOPRAZOLE SODIUM 40 MG PO TBEC
40.0000 mg | DELAYED_RELEASE_TABLET | Freq: Two times a day (BID) | ORAL | Status: DC
  Administered 2024-01-26 – 2024-01-27 (×3): 40 mg via ORAL
  Filled 2024-01-26 (×3): qty 1

## 2024-01-26 MED ORDER — VITAMIN B-12 1000 MCG PO TABS: 1000.0000 ug | ORAL_TABLET | Freq: Every day | ORAL | Status: DC

## 2024-01-26 MED ORDER — SEVELAMER CARBONATE 800 MG PO TABS
800.0000 mg | ORAL_TABLET | Freq: Three times a day (TID) | ORAL | Status: DC
  Administered 2024-01-26 – 2024-01-27 (×4): 800 mg via ORAL
  Filled 2024-01-26 (×4): qty 1

## 2024-01-26 MED ORDER — CYANOCOBALAMIN 1000 MCG/ML IJ SOLN
1000.0000 ug | Freq: Every day | INTRAMUSCULAR | Status: DC
Start: 2024-01-26 — End: 2024-01-29
  Administered 2024-01-26 – 2024-01-27 (×2): 1000 ug via INTRAMUSCULAR
  Filled 2024-01-26 (×2): qty 1

## 2024-01-26 MED ORDER — MELATONIN 3 MG PO TABS: 6.0000 mg | ORAL_TABLET | Freq: Every evening | ORAL | Status: DC | PRN

## 2024-01-26 MED ORDER — SODIUM CHLORIDE 0.9% IV SOLUTION
Freq: Once | INTRAVENOUS | Status: DC
Start: 2024-01-26 — End: 2024-01-27

## 2024-01-26 MED ORDER — SODIUM ZIRCONIUM CYCLOSILICATE 10 G PO PACK
10.0000 g | PACK | Freq: Once | ORAL | Status: AC
  Administered 2024-01-26: 10 g via ORAL
  Filled 2024-01-26: qty 1

## 2024-01-26 MED ORDER — CALCITRIOL 0.25 MCG PO CAPS: 1.0000 ug | ORAL_CAPSULE | ORAL | Status: DC

## 2024-01-26 NOTE — Progress Notes (Signed)
 Progress Note   Patient: [name] [name] FMW:[id number] DOB: 03-10-1961 DOA: 01/25/2024     0 DOS: the patient was seen and examined on 01/26/2024   Brief hospital course: The patient is a 63 year old male with PMHx of ESRD on HHD, being evaluated for renal transplant, bladder cancer s/p radical cystoprostatectomy and creation of neobladder c/b colo-neobladder fistula, left nephrectomy, HFmrEF, chronic anemia, T2DM, HTN, HLD, gout, mood disorder, who was transferred from [facility] ED for acute anemia.  Per history obtained from patient, was noted to not be at goal Hgb last week and dose of mircera was increased by nephrology. On Friday, he had issues with home HD and was unable to retrieve blood from the circuit. Hgb levels the following day were < 7, for which he was told to present to the hospital for blood transfusion.   Assessment and Plan:  #Acute blood loss anemia on anemia of CKD - Presented with a Hgb of 6.6 after loss of blood in home HD circuit on Friday in the setting of already downtrending Hgb secondary to CKD - Hgb this morning 5.9 - Transfusion of 2 units of pRBCs ordered - FOBT pending collection - Anemia workup remarkable for shows iron  90, TIBC 246, saturation ratio 37, UIBC 156, ferritin 1986, vitamin B12 155, transferrin 173, folate 9.6  #Hyperkalemia - K elevated to 5.9 - Lokelma  10 mg ordered  # Vit B12 deficiency - Vitamin B12 155 - Ordered IM cyanocobalamin  1000 mcg daily x 3 doses - Will follow-up with p.o. cyanocobalamin  1000 mg daily afterwards  # ESRD on HHD - Nephrology consulted for routine HD  # Hypertension - Continue home amlodipine  on non HD days  #Secondary hyperparathyroidism - Continue home calcitriol and sevelamer   #T2DM - Repeat HgbA1c ordered - SSI  #GERD - Continue Protonix  BID  #Mood disorder -Continue home venlafaxine   # Remote TIA - On ASA, held pending eval for occult bleeding     Subjective: Patient seen at bedside this  morning.  Appears to be in good spirits without any acute symptoms.  Had received 1 unit of PRBCs at the time and is waiting on the second.  Tolerating p.o. intake without any nausea, vomiting.  Denies any hematochezia, melena, hemoptysis, hematuria.  Physical Exam: Vitals:   01/26/24 0955 01/26/24 1010 01/26/24 1204 01/26/24 1235  BP: (!) 142/84 (!) 153/74 (!) 160/74 (!) 165/91  Pulse: 100 94 93   Resp:  13 (!) 22   Temp: 98 F (36.7 C) 97.7 F (36.5 C) (!) 97.3 F (36.3 C) 97.6 F (36.4 C)  TempSrc: Oral Oral Oral Oral  SpO2: 100% 100% 100%   Weight:      Height:       Physical Exam Constitutional:      General: He is not in acute distress.    Appearance: He is not ill-appearing.  Cardiovascular:     Rate and Rhythm: Normal rate and regular rhythm.     Heart sounds: Normal heart sounds. No murmur heard. Pulmonary:     Effort: Pulmonary effort is normal. No respiratory distress.     Breath sounds: Normal breath sounds. No wheezing.  Abdominal:     General: There is no distension.     Palpations: Abdomen is soft.     Tenderness: There is no abdominal tenderness.  Musculoskeletal:     Right lower leg: No edema.     Left lower leg: No edema.     Comments: Left forearm AVF w/ palpable thrill  Skin:    General: Skin is warm and dry.     Capillary Refill: Capillary refill takes less than 2 seconds.  Neurological:     Mental Status: He is alert and oriented to person, place, and time. Mental status is at baseline.    Family Communication: Not at bedside  Disposition: Status is: Observation The patient remains OBS appropriate and will d/c before 2 midnights.  Planned Discharge Destination: Home    Time spent: 37 minutes  Author: Redford Behrle Al-Sultani, MD 01/26/2024 12:44 PM  For on call review [email or web address].

## 2024-01-26 NOTE — Plan of Care (Signed)

## 2024-01-26 NOTE — Care Management Obs Status (Signed)
 MEDICARE OBSERVATION STATUS NOTIFICATION   Patient Details  Name: Marcella Dunnaway MRN: [id number] Date of Birth: May 03, 1960   Medicare Observation Status Notification Given:  Yes    Vonzell Arrie Sharps 01/26/2024, 3:49 PM

## 2024-01-26 NOTE — Progress Notes (Signed)
 Pt receives home HD care through [facility] home therapy dept. Home therapy staff aware of pt's admission. Will assist as needed.   Randine Mungo Dialysis Navigator [phone]

## 2024-01-26 NOTE — Consult Note (Signed)
 [facility]  INPATIENT CONSULTATION  Reason for Consultation: ESRD co management, hyperkalemia Requesting Provider: Dr. Mosie  HPI: [name] [name] is an 63 y.o. male ESRD on HHD, DM, gout, HTN, HL admitted with anemia and nephrology is consulted for management of hyperkalemia, ESRD and assoc condition.   Pt does HHD and was noted to be more anemic last week so ESA dose ^d - says 3 shots given last week.  He had a filter issue with HHD Friday and lost a circuit of blood.  Repeat of Hb outpt was < 7 so he was requested to present to hospital for transfusion.  Hb 6.6 > 5.9 and he's had 1u PRBC to date.  Labs this AM showing K 5.9.  Lokelma  has been ordered.    Last HD was on Monday.   He denies any recent bleeding or dark stool.  L forearm AVF no issues.    PMH: Past Medical History:  Diagnosis Date   Allergy    Anemia    Anemia in chronic kidney disease (CKD) 08/12/2016   Anxiety    Bladder cancer (HCC) 2013   Blood transfusion without reported diagnosis    Coagulation defect, unspecified 08/26/2016   Complication of anesthesia    agitation w/awakening in 9/13,OK 01/24/12   Depression    Diabetes mellitus type 2, diet-controlled (HCC) PT STOPPED TAKING METFORMIN --  HAS BEEN WATCHING DIET, EXERCISING AND LOSING WT   ESRD (end stage renal disease) (HCC) 08/11/2016   Frequency of urination    GERD (gastroesophageal reflux disease)    Gout    recent flair -bilateral feet--  STABLE   Grade II diastolic dysfunction    Hyperkalemia 08/12/2016   Hyperlipidemia    Hypertension    Hypertensive kidney disease with CKD (chronic kidney disease)    Nocturia    Non-compliance with renal dialysis    Secondary hyperparathyroidism of renal origin 08/12/2016   Urgency incontinence    PSH: Past Surgical History:  Procedure Laterality Date   AV FISTULA PLACEMENT Left 05/31/2014   Procedure: LEFT RADIOCEPHALIC ARTERIOVENOUS (AV) FISTULA CREATION ;  Surgeon: Lynwood JONETTA Collum, MD;  Location: [facility] OR;  Service: Vascular;  Laterality: Left;   BACK SURGERY     BLADDER SURGERY  04/26/2013   at [facility]  [place]      COLONOSCOPY     COLONOSCOPY WITH PROPOFOL  N/A 10/21/2022   Procedure: COLONOSCOPY WITH PROPOFOL ;  Surgeon: Leigh Elspeth SQUIBB, MD;  Location: [facility];  Service: Gastroenterology;  Laterality: N/A;  ESRD   CYSTOSCOPY W/ RETROGRADES Bilateral 07/10/2012   Procedure: CYSTOSCOPY WITH RETROGRADE PYELOGRAM;  Surgeon: Toribio Neysa Repine, MD;  Location: [facility];  Service: Urology;  Laterality: Bilateral;   CYSTOSCOPY WITH URETHRAL DILATATION  01/24/2012   Procedure: CYSTOSCOPY WITH URETHRAL DILATATION;  Surgeon: Toribio Neysa Repine, MD;  Location: [facility];  Service: Urology;  Laterality: N/A;   HEMOSTASIS CLIP PLACEMENT  10/21/2022   Procedure: HEMOSTASIS CLIP PLACEMENT;  Surgeon: Leigh Elspeth SQUIBB, MD;  Location: [facility];  Service: Gastroenterology;;   HEMOSTASIS CONTROL  10/21/2022   Procedure: HEMOSTASIS CONTROL;  Surgeon: Leigh Elspeth SQUIBB, MD;  Location: [facility];  Service: Gastroenterology;;   IR NEPHROSTOMY PLACEMENT LEFT  07/16/2016   LUMBAR LAMINECTOMY  06-02-2005   W/ RESECTION NERVE ROOT  L4  -  L5   NEPHROSTOMY TUBE PLACEMENT ([facility] HX)  2015   right    POLYPECTOMY  10/21/2022  Procedure: POLYPECTOMY;  Surgeon: Leigh Elspeth SQUIBB, MD;  Location: [facility];  Service: Gastroenterology;;   TRANSURETHRAL RESECTION OF BLADDER TUMOR  12/15/2011   Procedure: TRANSURETHRAL RESECTION OF BLADDER TUMOR (TURBT);  Surgeon: Toribio Neysa Repine, MD;  Location: [facility];  Service: Urology;  Laterality: N/A;  2 HRS    TRANSURETHRAL RESECTION OF BLADDER TUMOR  01/24/2012   Procedure: TRANSURETHRAL RESECTION OF BLADDER TUMOR (TURBT);  Surgeon: Toribio Neysa Repine, MD;  Location: [facility];  Service: Urology;  Laterality: N/A;  90  MIN    TRANSURETHRAL RESECTION OF BLADDER TUMOR N/A 07/10/2012   Procedure: TRANSURETHRAL RESECTION OF BLADDER TUMOR (TURBT);  Surgeon: Toribio Neysa Repine, MD;  Location: [facility];  Service: Urology;  Laterality: N/A;   ulner artery repair Right 2009   rt -trauma /thrombectomy,repair     Past Medical History:  Diagnosis Date   Allergy    Anemia    Anemia in chronic kidney disease (CKD) 08/12/2016   Anxiety    Bladder cancer (HCC) 2013   Blood transfusion without reported diagnosis    Coagulation defect, unspecified 08/26/2016   Complication of anesthesia    agitation w/awakening in 9/13,OK 01/24/12   Depression    Diabetes mellitus type 2, diet-controlled (HCC) PT STOPPED TAKING METFORMIN --  HAS BEEN WATCHING DIET, EXERCISING AND LOSING WT   ESRD (end stage renal disease) (HCC) 08/11/2016   Frequency of urination    GERD (gastroesophageal reflux disease)    Gout    recent flair -bilateral feet--  STABLE   Grade II diastolic dysfunction    Hyperkalemia 08/12/2016   Hyperlipidemia    Hypertension    Hypertensive kidney disease with CKD (chronic kidney disease)    Nocturia    Non-compliance with renal dialysis    Secondary hyperparathyroidism of renal origin 08/12/2016   Urgency incontinence     Medications:  I have reviewed the patient's current medications.  Medications Prior to Admission  Medication Sig Dispense Refill   amLODipine  (NORVASC ) 10 MG tablet Take 1 tablet (10 mg total) by mouth at bedtime. Do not take on days of dialysis unless BP > 120 mm Hg 90 tablet 1   aspirin  81 MG chewable tablet Chew 1 tablet (81 mg total) by mouth daily. 30 tablet 0   calcium  acetate (PHOSLO ) 667 MG capsule Take 1,334 mg by mouth at bedtime.     metoprolol  tartrate (LOPRESSOR ) 25 MG tablet Take 1 tablet (25 mg total) by mouth 2 (two) times daily. Hold on the days of dialysis 30 tablet 0   pantoprazole  (PROTONIX ) 40 MG tablet Take 80 mg by mouth daily.      repaglinide (PRANDIN) 0.5 MG tablet Take by mouth. TAKE 1 TABLET BY MOUTH 3 TIMES DAILY BEFORE MEALS. SKIP DOSE IF NOT EATING. FOR DIABETES     rosuvastatin  (CRESTOR ) 20 MG tablet Take 1 tablet (20 mg total) by mouth daily. Please call office to schedule an appt for further refills. Thank you 15 tablet 0   sevelamer  carbonate (RENVELA ) 800 MG tablet Take 800-1,600 mg by mouth See admin instructions. 1600 mg with meals, 800 mg with snacks.     venlafaxine  XR (EFFEXOR -XR) 150 MG 24 hr capsule Take 1 capsule (150 mg total) by mouth daily with breakfast. (Patient taking differently: Take 150 mg by mouth at bedtime.) 30 capsule 1    ALLERGIES:   Allergies  Allergen Reactions   Lisinopril Cough   Lisinopril Cough    FAM HX:  Family History  Problem Relation Age of Onset   Heart disease Mother    Diabetes Maternal Grandmother    Brain cancer Paternal Uncle    Hypertension Other    Bladder Cancer Neg Hx    Colon cancer Neg Hx    Esophageal cancer Neg Hx    Rectal cancer Neg Hx    Stomach cancer Neg Hx     Social History:   reports that he has been smoking cigarettes. He started smoking about 48 years ago. He has a 16 pack-year smoking history. He quit smokeless tobacco use about 32 years ago.  His smokeless tobacco use included chew. He reports that he does not drink alcohol and does not use drugs.  ROS: 12 system ROS neg except per HPI  Blood pressure (!) 165/91, pulse 93, temperature 97.6 F (36.4 C), temperature source Oral, resp. rate (!) 22, height 5' 10 (1.778 m), weight 93.2 kg, SpO2 100%. PHYSICAL EXAM: Gen:  lying flat comfortable  Eyes: anicteric, EOMI ENT:MMM Neck: supple CV: RRR Abd: soft Lungs: clear Extr: L forearm AVF aneurysms soft, compressible Neuro: nonfocal   Results for orders placed or performed during the hospital encounter of 01/25/24 (from the past 48 hours)  CBC with Differential     Status: Abnormal   Collection Time: 01/25/24  9:08 PM  Result Value  Ref Range   WBC 8.4 4.0 - 10.5 K/uL   RBC 2.06 (L) 4.22 - 5.81 MIL/uL   Hemoglobin 6.6 (LL) 13.0 - 17.0 g/dL    Comment: This critical result has been called to Elouise Sic RN by Katherne Cassis on 01/25/2024 22:02:34, and has been read back.   HCT 19.3 (L) 39.0 - 52.0 %   MCV 93.7 80.0 - 100.0 fL   MCH 32.0 26.0 - 34.0 pg   MCHC 34.2 30.0 - 36.0 g/dL   RDW 84.0 (H) 88.4 - 84.4 %   Platelets 257 150 - 400 K/uL   nRBC 0.5 (H) 0.0 - 0.2 %   Neutrophils Relative % 70 %   Neutro Abs 5.9 1.7 - 7.7 K/uL   Lymphocytes Relative 20 %   Lymphs Abs 1.7 0.7 - 4.0 K/uL   Monocytes Relative 7 %   Monocytes Absolute 0.6 0.1 - 1.0 K/uL   Eosinophils Relative 1 %   Eosinophils Absolute 0.1 0.0 - 0.5 K/uL   Basophils Relative 0 %   Basophils Absolute 0.0 0.0 - 0.1 K/uL   Immature Granulocytes 2 %   Abs Immature Granulocytes 0.15 (H) 0.00 - 0.07 K/uL    Comment: Performed at [facility], [address], [place], [place] [address]  Basic metabolic panel     Status: Abnormal   Collection Time: 01/25/24  9:08 PM  Result Value Ref Range   Sodium 135 135 - 145 mmol/L   Potassium 5.0 3.5 - 5.1 mmol/L   Chloride 92 (L) 98 - 111 mmol/L   CO2 25 22 - 32 mmol/L   Glucose, Bld 250 (H) 70 - 99 mg/dL    Comment: Glucose reference range applies only to samples taken after fasting for at least 8 hours.   BUN 52 (H) 8 - 23 mg/dL   Creatinine, Ser 1.55 (H) 0.61 - 1.24 mg/dL   Calcium  9.8 8.9 - 10.3 mg/dL   GFR, Estimated 6 (L) >60 mL/min    Comment: (NOTE) Calculated using the CKD-EPI Creatinine Equation (2021)    Anion gap 18 (H) 5 - 15    Comment: Performed  at [facility], [address] [place], [place] [address]  Type and screen [facility]     Status: None (Preliminary result)   Collection Time: 01/26/24  7:19 AM  Result Value Ref Range   ABO/RH(D) A POS    Antibody Screen NEG    Sample Expiration 01/29/2024,2359    Unit Number [id number]     Blood Component Type RED CELLS,LR    Unit division 00    Status of Unit ISSUED    Transfusion Status OK TO TRANSFUSE    Crossmatch Result      Compatible Performed at [facility], [address], [place], [place] [address]    Unit Number [id number]    Blood Component Type RED CELLS,LR    Unit division 00    Status of Unit ALLOCATED    Transfusion Status OK TO TRANSFUSE    Crossmatch Result Compatible   Prepare RBC (crossmatch)     Status: None   Collection Time: 01/26/24  7:19 AM  Result Value Ref Range   Order Confirmation      ORDER PROCESSED BY BLOOD BANK Performed at [facility], [address] [place], [place] [address]   Basic metabolic panel with GFR     Status: Abnormal   Collection Time: 01/26/24  7:23 AM  Result Value Ref Range   Sodium 133 (L) 135 - 145 mmol/L   Potassium 5.9 (H) 3.5 - 5.1 mmol/L   Chloride 94 (L) 98 - 111 mmol/L   CO2 23 22 - 32 mmol/L   Glucose, Bld 182 (H) 70 - 99 mg/dL    Comment: Glucose reference range applies only to samples taken after fasting for at least 8 hours.   BUN 57 (H) 8 - 23 mg/dL   Creatinine, Ser 0.99 (H) 0.61 - 1.24 mg/dL   Calcium  9.1 8.9 - 10.3 mg/dL   GFR, Estimated 6 (L) >60 mL/min    Comment: (NOTE) Calculated using the CKD-EPI Creatinine Equation (2021)    Anion gap 16 (H) 5 - 15    Comment: Performed at [facility], [address], [place], [place] [address]  CBC     Status: Abnormal   Collection Time: 01/26/24  7:23 AM  Result Value Ref Range   WBC 7.4 4.0 - 10.5 K/uL   RBC 1.85 (L) 4.22 - 5.81 MIL/uL   Hemoglobin 5.9 (LL) 13.0 - 17.0 g/dL    Comment: REPEATED TO VERIFY This critical result has been called to CHARLENA CASINO, RN by [facility] on 01/26/2024 08:07:01, and has been read back.    HCT 17.3 (L) 39.0 - 52.0 %   MCV 93.5 80.0 - 100.0 fL   MCH 31.9 26.0 - 34.0 pg   MCHC 34.1 30.0 - 36.0 g/dL   RDW 83.8 (H) 88.4 - 84.4 %   Platelets 206 150 - 400 K/uL    Comment:  REPEATED TO VERIFY   nRBC 0.5 (H) 0.0 - 0.2 %    Comment: Performed at [facility], [address] [place], [place] [address]  Magnesium      Status: None   Collection Time: 01/26/24  7:23 AM  Result Value Ref Range   Magnesium  1.7 1.7 - 2.4 mg/dL    Comment: Performed at [facility], [address] [place], [place] [address]  Phosphorus     Status: None   Collection Time: 01/26/24  7:23 AM  Result Value Ref  Range   Phosphorus 4.4 2.5 - 4.6 mg/dL    Comment: Performed at [facility], [address] [place], [place] [address]  Ferritin     Status: Abnormal   Collection Time: 01/26/24  7:23 AM  Result Value Ref Range   Ferritin 1,986 (H) 24 - 336 ng/mL    Comment: Performed at [facility], [address], [place], [place] [address]  Iron  and TIBC     Status: Abnormal   Collection Time: 01/26/24  7:23 AM  Result Value Ref Range   Iron  90 45 - 182 ug/dL   TIBC 753 (L) 749 - 549 ug/dL   Saturation Ratios 37 17.9 - 39.5 %   UIBC 156 ug/dL    Comment: Performed at [facility], [address], [place], [place] [address]  Vitamin B12     Status: Abnormal   Collection Time: 01/26/24  7:23 AM  Result Value Ref Range   Vitamin B-12 155 (L) 180 - 914 pg/mL    Comment: (NOTE) This assay is not validated for testing neonatal or myeloproliferative syndrome specimens for Vitamin B12 levels. Performed at [facility], [address], [place], [place] [address]   Transferrin     Status: Abnormal   Collection Time: 01/26/24  7:23 AM  Result Value Ref Range   Transferrin 173 (L) 180 - 329 mg/dL    Comment: Performed at [facility], [address], [place], [place] [address]  Folate     Status: None   Collection Time: 01/26/24  7:23 AM  Result Value Ref Range   Folate 9.6 >5.9 ng/mL    Comment: Performed at [facility], [address] [place], [place] [address]  ABO/Rh     Status: None   Collection Time: 01/26/24  7:27 AM  Result  Value Ref Range   ABO/RH(D)      A POS Performed at [facility], [address], [place], [place] [address]     No results found.  HHD outpt:  4x wk, says only runs 3x/wk, tx time 3hr, EDW 88.5, BFR 400, calcitriol 1mcg three times per week, no heparin   Assessment/Plan Elven Laboy is an 63 y.o. male ESRD on HHD, DM, gout, HTN, HL admitted with anemia and nephrology is consulted for management of hyperkalemia, ESRD and assoc condition.   **Anemia:  Hb < 6, requiring transfusion.  Had some blood loss with dialyzer circuit issue, but Hb declining prior to that.  ESA dose just increased and administered last week.  Primary rechecking Hb and giving more pRBC if needed. Iron  sat ok.  Hemoccult ordered, not collected.   **Hyperkalemia:  lokelma  now, HD later, renal diet. Labs in AM.  **ESRD on HD: HHD - last tx Monday, will do HD today the three times per week thereafter while remains admitted.  EDW 88.5kg.  Will do 3L UF with tx today as tol, standing post wt.   **Secondary hyperPTH: cont binders and outpt calcitriol dose.   **HTN:  outpt meds and UF with HD.   Will follow reach out with concerns.    Manuelita DELENA Barters 01/26/2024, 3:14 PM

## 2024-01-26 NOTE — Hospital Course (Addendum)
 The patient is a 63 year old male with PMHx of ESRD on HHD, being evaluated for renal transplant, bladder cancer s/p radical cystoprostatectomy and creation of neobladder c/b colo-neobladder fistula, left nephrectomy, HFmrEF, chronic anemia, T2DM, HTN, HLD, gout, mood disorder, who was transferred from [facility] ED for acute anemia.  Per history obtained from patient, was noted to not be at goal Hgb last week and dose of mircera was increased by nephrology. On Friday, he had issues with home HD and was unable to retrieve blood from the circuit. Hgb levels the following day were < 7, for which he was told to present to the hospital for blood transfusion.

## 2024-01-26 NOTE — H&P (Addendum)
 History and Physical    [name] [name] FMW:[id number] DOB: [birth year] DOA: 01/25/2024  PCP: [name] Lucie LABOR, MD   Patient coming from: Transfer from [facility] ED    Chief Complaint:  Chief Complaint  Patient presents with   Abnormal Lab    HPI:  [name] [name] is a 63 y.o. male with hx of ESRD on home HD, under evaluation for renal transplant, Bladder cancer, s/p radical cystoprostatectomy, creation of neobladder, c/b colo-neobladder fistula, left nephrectomy, HFmrEF, chronic anemia, DM type 2, HTN, HLD, gout, mood d/o, who is transferred from [facility] for anemia. Reports having drop from Hb ~ 10 -> 7 when checked 2-3 weeks ago; Per nephrology note in care everywhere Hb 01/09/2024: 7.9. Had infusions of Epo as outpatient. He otherwise notes that on Friday he was unable to complete his HD session and lost blood that remained in the circuit. He took a blood sample on Saturday that was just processed yesterday and showed anemia requiring transfusion so was referred to the ED, presented to drawbdride and now transferred here. He does take aspirin  81 mg daily for stroke prevention, per chart review started after possible TIA in 1/'24 He is not on other anticoagulants. Denies any bleeding sites. He does pass stool in his urine related to fistula, denies any gross hematuria. He denies any melena or hematochezia. Does have significant reflux symptoms which are worse at night, on a PPI daily. He does home HD on MWF and holds his amlodipine  on HD days due to intradialytic hypotension    Review of Systems:  ROS complete and negative except as marked above   Allergies  Allergen Reactions   Lisinopril Cough   Lisinopril Cough    Prior to Admission medications   Medication Sig Start Date End Date Taking? Authorizing Provider  amLODipine  (NORVASC ) 10 MG tablet Take 1 tablet (10 mg total) by mouth at bedtime. Do not take on days of dialysis unless BP > 120 mm Hg 06/22/22   [name] Heinz, MD  aspirin  81  MG chewable tablet Chew 1 tablet (81 mg total) by mouth daily. 04/26/22   Singh, [name] K, MD  calcium  acetate (PHOSLO ) 667 MG capsule Take 1,334 mg by mouth at bedtime. 08/13/21   [provider]  metoprolol  tartrate (LOPRESSOR ) 25 MG tablet Take 1 tablet (25 mg total) by mouth 2 (two) times daily. Hold on the days of dialysis 08/08/23   [name] Heinz, MD  pantoprazole  (PROTONIX ) 40 MG tablet Take 80 mg by mouth daily.    [provider]  repaglinide (PRANDIN) 0.5 MG tablet Take by mouth. TAKE 1 TABLET BY MOUTH 3 TIMES DAILY BEFORE MEALS. SKIP DOSE IF NOT EATING. FOR DIABETES    [provider]  rosuvastatin  (CRESTOR ) 20 MG tablet Take 1 tablet (20 mg total) by mouth daily. Please call office to schedule an appt for further refills. Thank you 07/04/23   [name] Heinz, MD  sevelamer  carbonate (RENVELA ) 800 MG tablet Take 800-1,600 mg by mouth See admin instructions. 1600 mg with meals, 800 mg with snacks. 07/27/21   [provider]  venlafaxine  XR (EFFEXOR -XR) 150 MG 24 hr capsule Take 1 capsule (150 mg total) by mouth daily with breakfast. Patient taking differently: Take 150 mg by mouth at bedtime. 07/06/17   [name], Saramma, MD  QUEtiapine  (SEROQUEL ) 25 MG tablet Take 1 tablet (25 mg total) by mouth at bedtime. Patient not taking: Reported on 05/04/2018 07/06/17 05/27/19  [name], Saramma, MD    Past Medical History:  Diagnosis Date   Allergy    Anemia    Anemia in chronic kidney disease (CKD) 08/12/2016   Anxiety    Bladder cancer (HCC) 2013   Blood transfusion without reported diagnosis    Coagulation defect, unspecified 08/26/2016   Complication of anesthesia    agitation w/awakening in 9/13,OK 01/24/12   Depression    Diabetes mellitus type 2, diet-controlled (HCC) PT STOPPED TAKING METFORMIN --  HAS BEEN WATCHING DIET, EXERCISING AND LOSING WT   ESRD (end stage renal disease) (HCC) 08/11/2016   Frequency of urination    GERD (gastroesophageal reflux disease)     Gout    recent flair -bilateral feet--  STABLE   Grade II diastolic dysfunction    Hyperkalemia 08/12/2016   Hyperlipidemia    Hypertension    Hypertensive kidney disease with CKD (chronic kidney disease)    Nocturia    Non-compliance with renal dialysis    Secondary hyperparathyroidism of renal origin 08/12/2016   Urgency incontinence     Past Surgical History:  Procedure Laterality Date   AV FISTULA PLACEMENT Left 05/31/2014   Procedure: LEFT RADIOCEPHALIC ARTERIOVENOUS (AV) FISTULA CREATION ;  Surgeon: Lynwood JONETTA Collum, MD;  Location: [facility] OR;  Service: Vascular;  Laterality: Left;   BACK SURGERY     BLADDER SURGERY  04/26/2013   at [facility]  [place]      COLONOSCOPY     COLONOSCOPY WITH PROPOFOL  N/A 10/21/2022   Procedure: COLONOSCOPY WITH PROPOFOL ;  Surgeon: Leigh Elspeth SQUIBB, MD;  Location: [facility];  Service: Gastroenterology;  Laterality: N/A;  ESRD   CYSTOSCOPY W/ RETROGRADES Bilateral 07/10/2012   Procedure: CYSTOSCOPY WITH RETROGRADE PYELOGRAM;  Surgeon: Toribio Neysa Repine, MD;  Location: [facility];  Service: Urology;  Laterality: Bilateral;   CYSTOSCOPY WITH URETHRAL DILATATION  01/24/2012   Procedure: CYSTOSCOPY WITH URETHRAL DILATATION;  Surgeon: Toribio Neysa Repine, MD;  Location: [facility];  Service: Urology;  Laterality: N/A;   HEMOSTASIS CLIP PLACEMENT  10/21/2022   Procedure: HEMOSTASIS CLIP PLACEMENT;  Surgeon: Leigh Elspeth SQUIBB, MD;  Location: [facility];  Service: Gastroenterology;;   HEMOSTASIS CONTROL  10/21/2022   Procedure: HEMOSTASIS CONTROL;  Surgeon: Leigh Elspeth SQUIBB, MD;  Location: [facility];  Service: Gastroenterology;;   IR NEPHROSTOMY PLACEMENT LEFT  07/16/2016   LUMBAR LAMINECTOMY  06-02-2005   W/ RESECTION NERVE ROOT  L4  -  L5   NEPHROSTOMY TUBE PLACEMENT ([facility] HX)  2015   right    POLYPECTOMY  10/21/2022   Procedure: POLYPECTOMY;  Surgeon: Leigh Elspeth SQUIBB, MD;   Location: [facility];  Service: Gastroenterology;;   TRANSURETHRAL RESECTION OF BLADDER TUMOR  12/15/2011   Procedure: TRANSURETHRAL RESECTION OF BLADDER TUMOR (TURBT);  Surgeon: Toribio Neysa Repine, MD;  Location: [facility];  Service: Urology;  Laterality: N/A;  2 HRS    TRANSURETHRAL RESECTION OF BLADDER TUMOR  01/24/2012   Procedure: TRANSURETHRAL RESECTION OF BLADDER TUMOR (TURBT);  Surgeon: Toribio Neysa Repine, MD;  Location: [facility];  Service: Urology;  Laterality: N/A;  90 MIN    TRANSURETHRAL RESECTION OF BLADDER TUMOR N/A 07/10/2012   Procedure: TRANSURETHRAL RESECTION OF BLADDER TUMOR (TURBT);  Surgeon: Toribio Neysa Repine, MD;  Location: [facility];  Service: Urology;  Laterality: N/A;   ulner artery repair Right 2009   rt -trauma /thrombectomy,repair     reports that he has been smoking cigarettes. He started smoking about 48 years ago. He has  a 16 pack-year smoking history. He quit smokeless tobacco use about 32 years ago.  His smokeless tobacco use included chew. He reports that he does not drink alcohol and does not use drugs.  Family History  Problem Relation Age of Onset   Heart disease Mother    Diabetes Maternal Grandmother    Brain cancer Paternal Uncle    Hypertension Other    Bladder Cancer Neg Hx    Colon cancer Neg Hx    Esophageal cancer Neg Hx    Rectal cancer Neg Hx    Stomach cancer Neg Hx      Physical Exam: Vitals:   01/26/24 0045 01/26/24 0100 01/26/24 0129 01/26/24 0243  BP: (!) 144/70 (!) 144/74  (!) 153/97  Pulse: 89 92  (!) 103  Resp: 17 18  19   Temp:   97.9 F (36.6 C) 98.2 F (36.8 C)  TempSrc:   Oral Oral  SpO2: 97% 97%  94%  Weight:    93.2 kg  Height:        Gen: Awake, alert, NAD   CV: Regular, normal S1, S2, no murmurs. AV Fistula L arm  Resp: Normal WOB, CTAB  Abd: Flat, normoactive, nontender MSK: Symmetric, 1+ edema  Skin: No rashes or lesions to exposed skin  Neuro:  Alert and interactive  Psych: euthymic, appropriate    Data review:   Labs reviewed, notable for:   Cr 8.4 ESRD  [name] 5, bicarb 25, AG 18, BUN 52   Hb 6.6, MCV 93  Other cell lines intact   BG 250   Micro:  Results for orders placed or performed during the hospital encounter of 04/23/22  Resp panel by RT-PCR (RSV, Flu A&B, Covid) Anterior Nasal Swab     Status: None   Collection Time: 04/23/22  7:43 PM   Specimen: Anterior Nasal Swab  Result Value Ref Range Status   SARS Coronavirus 2 by RT PCR NEGATIVE NEGATIVE Final    Comment: (NOTE) SARS-CoV-2 target nucleic acids are NOT DETECTED.  The SARS-CoV-2 RNA is generally detectable in upper respiratory specimens during the acute phase of infection. The lowest concentration of SARS-CoV-2 viral copies this assay can detect is 138 copies/mL. A negative result does not preclude SARS-Cov-2 infection and should not be used as the sole basis for treatment or other patient management decisions. A negative result may occur with  improper specimen collection/handling, submission of specimen other than nasopharyngeal swab, presence of viral mutation(s) within the areas targeted by this assay, and inadequate number of viral copies(<138 copies/mL). A negative result must be combined with clinical observations, patient history, and epidemiological information. The expected result is Negative.  Fact Sheet for Patients:  BloggerCourse.com  Fact Sheet for Healthcare Providers:  SeriousBroker.it  This test is no t yet approved or cleared by the [place]  FDA and  has been authorized for detection and/or diagnosis of SARS-CoV-2 by FDA under an Emergency Use Authorization (EUA). This EUA will remain  in effect (meaning this test can be used) for the duration of the COVID-19 declaration under Section 564(b)(1) of the Act, 21 U.S.C.section 360bbb-3(b)(1), unless the authorization is  terminated  or revoked sooner.       Influenza A by PCR NEGATIVE NEGATIVE Final   Influenza B by PCR NEGATIVE NEGATIVE Final    Comment: (NOTE) The Xpert Xpress SARS-CoV-2/FLU/RSV plus assay is intended as an aid in the diagnosis of influenza from Nasopharyngeal swab specimens and should not be used as a sole  basis for treatment. Nasal washings and aspirates are unacceptable for Xpert Xpress SARS-CoV-2/FLU/RSV testing.  Fact Sheet for Patients: BloggerCourse.com  Fact Sheet for Healthcare Providers: SeriousBroker.it  This test is not yet approved or cleared by the [place]  FDA and has been authorized for detection and/or diagnosis of SARS-CoV-2 by FDA under an Emergency Use Authorization (EUA). This EUA will remain in effect (meaning this test can be used) for the duration of the COVID-19 declaration under Section 564(b)(1) of the Act, 21 U.S.C. section 360bbb-3(b)(1), unless the authorization is terminated or revoked.     Resp Syncytial Virus by PCR NEGATIVE NEGATIVE Final    Comment: (NOTE) Fact Sheet for Patients: BloggerCourse.com  Fact Sheet for Healthcare Providers: SeriousBroker.it  This test is not yet approved or cleared by the [place]  FDA and has been authorized for detection and/or diagnosis of SARS-CoV-2 by FDA under an Emergency Use Authorization (EUA). This EUA will remain in effect (meaning this test can be used) for the duration of the COVID-19 declaration under Section 564(b)(1) of the Act, 21 U.S.C. section 360bbb-3(b)(1), unless the authorization is terminated or revoked.  Performed at [facility], [address] [place], [place] [address]   Culture, blood (Routine X 2) w Reflex to ID Panel     Status: None   Collection Time: 04/25/22  3:32 AM   Specimen: BLOOD  Result Value Ref Range Status   Specimen Description BLOOD BLOOD RIGHT  ARM  Final   Special Requests   Final    BOTTLES DRAWN AEROBIC AND ANAEROBIC Blood Culture adequate volume   Culture   Final    NO GROWTH 5 DAYS Performed at [facility], [address] [place], [place] [address]    Report Status 04/30/2022 FINAL  Final  Culture, blood (Routine X 2) w Reflex to ID Panel     Status: None   Collection Time: 04/25/22  3:32 AM   Specimen: BLOOD  Result Value Ref Range Status   Specimen Description BLOOD BLOOD RIGHT ARM  Final   Special Requests   Final    BOTTLES DRAWN AEROBIC AND ANAEROBIC Blood Culture adequate volume   Culture   Final    NO GROWTH 5 DAYS Performed at [facility], [address] [place], [place] [address]    Report Status 04/30/2022 FINAL  Final    Imaging reviewed:  No results found.   ED Course:   Transferred from [facility] ED for transfusion    Assessment/Plan:  63 y.o. male with hx ESRD on home HD, under evaluation for renal transplant, Bladder cancer, s/p radical cystoprostatectomy, creation of neobladder, c/b colo-neobladder fistula, left nephrectomy, HFmrEF, chronic anemia, DM type 2, HTN, HLD, gout, mood d/o, who is transferred from [facility] for anemia.   Anemia, acute on chronic, severe  Last Hb on record 10.5 in 5/'25 -> down to 6.6 at [facility] ED. Denies any bleeding, declined rectal exam. other cell lines are intact. Suspect at least component anemia of CKD.  -- Repeat CBC, type and screen, ordered for 1 U RBC -- Check Iron  panel, B12, folate  -- possible Epo, defer to nephrology  -- Send stool when available for FOBT   Chronic medical problems:  ESRD on home HD: under evaluation for renal transplant. Routine nephrology c/s in AM for HD if remains as inpatient. Continue home Sevelamer   Bladder cancer: s/p radical cystoprostatectomy, creation of neobladder, c/b colo-neobladder fistula, left nephrectomy. Will need f/u with Urology as outpatient.  HFmrEF: Noted, without acute exacerbation  Hx TIA: in 1/'24, on  aspirin  since this time, hold for now pending eval for occult bleeding  DM type 2: Last A1c 7.5% in 2/'25. Repeat A1c, SSI for very sensitive  HTN: Continue home amlodipine  on non HD days  HLD: Not on statin  Gout: Not on urate lowering meds  GERD: continue home PPI  Mood d/o: Continue home Venlafaxine    Body mass index is 29.48 kg/m.   DVT prophylaxis:  SCDs Code Status:  Full Code   Diet:  Diet Orders (From admission, onward)    None      Family Communication:  None   Consults:  None   Admission status:   Observation, Step Down Unit  Severity of Illness: The appropriate patient status for this patient is OBSERVATION. Observation status is judged to be reasonable and necessary in order to provide the required intensity of service to ensure the patient's safety. The patient's presenting symptoms, physical exam findings, and initial radiographic and laboratory data in the context of their medical condition is felt to place them at decreased risk for further clinical deterioration. Furthermore, it is anticipated that the patient will be medically stable for discharge from the hospital within 2 midnights of admission.    Dorn Dawson, MD [facility]  How to contact the [facility] Attending or Consulting provider 7A - 7P or covering provider during after hours 7P -7A, for this patient.  Check the care team in [facility] and look for a) attending/consulting [facility] provider listed and b) the [facility] team listed Log into [email or web address] and use [place]'s universal password to access. If you do not have the password, please contact the hospital operator. Locate the [facility] provider you are looking for under [facility] and page to a number that you can be directly reached. If you still have difficulty reaching the provider, please page the [facility] (Director on Call) for the Hospitalists listed on amion for assistance.  01/26/2024, 5:18 AM

## 2024-01-26 NOTE — Progress Notes (Signed)
 Called phlebotomist per doctors order to come and draw type and screen ASAP for this patient, the one that answered the phone said , someone will cone soon, I'm still  waiting for it to be done, so patient can get his blood transfusion administered.

## 2024-01-26 NOTE — Plan of Care (Signed)

## 2024-01-27 DIAGNOSIS — D62 Acute posthemorrhagic anemia: Secondary | ICD-10-CM | POA: Diagnosis not present

## 2024-01-27 DIAGNOSIS — D649 Anemia, unspecified: Secondary | ICD-10-CM | POA: Diagnosis not present

## 2024-01-27 LAB — CBC
HCT: 22.7 % — ABNORMAL LOW (ref 39.0–52.0)
Hemoglobin: 7.9 g/dL — ABNORMAL LOW (ref 13.0–17.0)
MCH: 31.5 pg (ref 26.0–34.0)
MCHC: 34.8 g/dL (ref 30.0–36.0)
MCV: 90.4 fL (ref 80.0–100.0)
Platelets: 210 K/uL (ref 150–400)
RBC: 2.51 MIL/uL — ABNORMAL LOW (ref 4.22–5.81)
RDW: 16.1 % — ABNORMAL HIGH (ref 11.5–15.5)
WBC: 8.2 K/uL (ref 4.0–10.5)
nRBC: 0.2 % (ref 0.0–0.2)

## 2024-01-27 LAB — RENAL FUNCTION PANEL
Albumin: 3.4 g/dL — ABNORMAL LOW (ref 3.5–5.0)
Anion gap: 15 (ref 5–15)
BUN: 25 mg/dL — ABNORMAL HIGH (ref 8–23)
CO2: 26 mmol/L (ref 22–32)
Calcium: 8.9 mg/dL (ref 8.9–10.3)
Chloride: 92 mmol/L — ABNORMAL LOW (ref 98–111)
Creatinine, Ser: 4.66 mg/dL — ABNORMAL HIGH (ref 0.61–1.24)
GFR, Estimated: 13 mL/min — ABNORMAL LOW (ref >60)
Glucose, Bld: 139 mg/dL — ABNORMAL HIGH (ref 70–99)
Phosphorus: 3.4 mg/dL (ref 2.5–4.6)
Potassium: 3.8 mmol/L (ref 3.5–5.1)
Sodium: 133 mmol/L — ABNORMAL LOW (ref 135–145)

## 2024-01-27 LAB — TYPE AND SCREEN
ABO/RH(D): A POS
Antibody Screen: NEGATIVE
Unit division: 0
Unit division: 0

## 2024-01-27 LAB — BPAM RBC
Blood Product Expiration Date: 202511172359
Blood Product Expiration Date: 202511172359
ISSUE DATE / TIME: 202510230945
ISSUE DATE / TIME: 202510231546
Unit Type and Rh: 6200
Unit Type and Rh: 6200

## 2024-01-27 LAB — HIV ANTIBODY (ROUTINE TESTING W REFLEX): HIV Screen 4th Generation wRfx: NONREACTIVE

## 2024-01-27 MED ORDER — CYANOCOBALAMIN 1000 MCG PO TABS: 1000.0000 ug | ORAL_TABLET | Freq: Every day | ORAL | 0 refills | Status: AC

## 2024-01-27 NOTE — Progress Notes (Signed)
 D/C order noted. Contacted [facility] home therapy staff to be advised of pt's d/c today and that will resume home HD at d/c.   Randine [facility] [phone]

## 2024-01-27 NOTE — Discharge Summary (Signed)
 Physician Discharge Summary   Patient: [name] [name] MRN: [id number] DOB: 01-01-1961  Admit date:     01/25/2024  Discharge date: 01/27/24  Discharge Physician: Duffy Al-Sultani   PCP: Leila Lucie LABOR, MD   Recommendations at discharge:   Follow up with PCP to repeat blood work and check Hgb levels Follow up with nephrologist  Discharge Diagnoses: Principal Problem:   Anemia   Hospital Course: The patient is a 63 year old male with PMHx of ESRD on HHD, being evaluated for renal transplant, bladder cancer s/p radical cystoprostatectomy and creation of neobladder c/b colo-neobladder fistula, left nephrectomy, HFmrEF, chronic anemia, T2DM, HTN, HLD, gout, mood disorder, who was transferred from [facility] ED for acute anemia.  Per history obtained from patient, was noted to not be at goal Hgb last week and dose of mircera was increased by nephrology. On Friday, he had issues with home HD and was unable to retrieve blood from the circuit. Hgb levels the following day were < 7, for which he was told to present to the hospital for blood transfusion.   #Acute blood loss anemia on anemia of CKD - Presented with a Hgb of 6.6 after loss of blood in home HD circuit on Friday in the setting of already downtrending Hgb secondary to CKD - Hgb 5.9 on repeat  - Transfused 2 units of pRBCs  - Anemia workup remarkable for shows iron  90, TIBC 246, saturation ratio 37, UIBC 156, ferritin 1986, vitamin B12 155, transferrin 173, folate 9.6 - Hgb at stable at 8.2 then 7.9 post transfusion. Hgb 7.9 at the time of discharge.  - Recommend follow up with PCP within 1 week to monitor Hgb   #Hyperkalemia - K was noted to elevated to 5.9 - Lokelma  10 mg was given and then the patient underwent HD - K 3.8 at the time of discharge - Follow up with PCP to repeat BMP   # Vit B12 deficiency - Vitamin B12 155 - Received IM cyanocobalamin  1000 mcg daily x 2 doses - Prescribed p.o. cyanocobalamin  1000 mg daily at  discharge - Follow up with PCP to monitor Vit B12 levels and determine duration of therapy   # ESRD on HHD - Underwent routine HD   # Hypertension - Continue home amlodipine  on non HD days   #Secondary hyperparathyroidism - Continue home calcitriol and sevelamer    #T2DM - Hgb A1c 7.1 - SSI   #GERD - Continue Protonix  BID   #Mood disorder -Continue home venlafaxine    # Remote TIA - On ASA   Consultants: Nephrology Procedures performed: HD  Disposition: Home Diet recommendation:   Diet Orders (From admission, onward)     Start     Ordered   01/26/24 0533  Diet renal with fluid restriction Fluid restriction: 2000 mL Fluid; Room service appropriate? Yes; Fluid consistency: Thin  Diet effective now       Question Answer Comment  Fluid restriction: 2000 mL Fluid   Room service appropriate? Yes   Fluid consistency: Thin      01/26/24 0532            DISCHARGE MEDICATION: Allergies as of 01/27/2024       Reactions   Lisinopril Cough   Lisinopril Cough        Medication List     STOP taking these medications    calcium  acetate 667 MG capsule Commonly known as: PHOSLO        TAKE these medications    amLODipine  10 MG tablet Commonly  known as: NORVASC  Take 1 tablet (10 mg total) by mouth at bedtime. Do not take on days of dialysis unless BP > 120 mm Hg   aspirin  81 MG chewable tablet Chew 1 tablet (81 mg total) by mouth daily.   cyanocobalamin  1000 MCG tablet Take 1 tablet (1,000 mcg total) by mouth daily. Start taking on: January 29, 2024   metoprolol  tartrate 25 MG tablet Commonly known as: LOPRESSOR  Take 1 tablet (25 mg total) by mouth 2 (two) times daily. Hold on the days of dialysis   pantoprazole  40 MG tablet Commonly known as: PROTONIX  Take 80 mg by mouth daily.   repaglinide 0.5 MG tablet Commonly known as: PRANDIN Take by mouth. TAKE 1 TABLET BY MOUTH 3 TIMES DAILY BEFORE MEALS. SKIP DOSE IF NOT EATING. FOR DIABETES    rosuvastatin  20 MG tablet Commonly known as: CRESTOR  Take 1 tablet (20 mg total) by mouth daily. Please call office to schedule an appt for further refills. Thank you   sevelamer  carbonate 800 MG tablet Commonly known as: RENVELA  Take 800-1,600 mg by mouth See admin instructions. 1600 mg with meals, 800 mg with snacks.   venlafaxine  XR 150 MG 24 hr capsule Commonly known as: EFFEXOR -XR Take 1 capsule (150 mg total) by mouth daily with breakfast. What changed: when to take this        Discharge Exam: Filed Weights   01/25/24 2045 01/26/24 0243 01/27/24 0623  Weight: 90.7 kg 93.2 kg 91.4 kg   Physical Exam Constitutional:      General: He is not in acute distress.    Appearance: He is not ill-appearing.  Cardiovascular:     Rate and Rhythm: Normal rate and regular rhythm.     Heart sounds: Normal heart sounds. No murmur heard. Pulmonary:     Effort: Pulmonary effort is normal. No respiratory distress.     Breath sounds: Normal breath sounds. No wheezing.  Abdominal:     General: There is no distension.     Palpations: Abdomen is soft.     Tenderness: There is no abdominal tenderness.  Musculoskeletal:     Right lower leg: No edema.     Left lower leg: No edema.     Comments: Left forearm AVF w/ palpable thrill  Skin:    General: Skin is warm and dry.     Capillary Refill: Capillary refill takes less than 2 seconds.  Neurological:     Mental Status: He is alert and oriented to person, place, and time. Mental status is at baseline.   Condition at discharge: good  The results of significant diagnostics from this hospitalization (including imaging, microbiology, ancillary and laboratory) are listed below for reference.   Imaging Studies: No results found.  Microbiology: Results for orders placed or performed during the hospital encounter of 04/23/22  Resp panel by RT-PCR (RSV, Flu A&B, Covid) Anterior Nasal Swab     Status: None   Collection Time: 04/23/22  7:43 PM    Specimen: Anterior Nasal Swab  Result Value Ref Range Status   SARS Coronavirus 2 by RT PCR NEGATIVE NEGATIVE Final    Comment: (NOTE) SARS-CoV-2 target nucleic acids are NOT DETECTED.  The SARS-CoV-2 RNA is generally detectable in upper respiratory specimens during the acute phase of infection. The lowest concentration of SARS-CoV-2 viral copies this assay can detect is 138 copies/mL. A negative result does not preclude SARS-Cov-2 infection and should not be used as the sole basis for treatment or other patient management decisions.  A negative result may occur with  improper specimen collection/handling, submission of specimen other than nasopharyngeal swab, presence of viral mutation(s) within the areas targeted by this assay, and inadequate number of viral copies(<138 copies/mL). A negative result must be combined with clinical observations, patient history, and epidemiological information. The expected result is Negative.  Fact Sheet for Patients:  bloggercourse.com  Fact Sheet for Healthcare Providers:  seriousbroker.it  This test is no t yet approved or cleared by the [place]  FDA and  has been authorized for detection and/or diagnosis of SARS-CoV-2 by FDA under an Emergency Use Authorization (EUA). This EUA will remain  in effect (meaning this test can be used) for the duration of the COVID-19 declaration under Section 564(b)(1) of the Act, 21 U.S.C.section 360bbb-3(b)(1), unless the authorization is terminated  or revoked sooner.       Influenza A by PCR NEGATIVE NEGATIVE Final   Influenza B by PCR NEGATIVE NEGATIVE Final    Comment: (NOTE) The Xpert Xpress SARS-CoV-2/FLU/RSV plus assay is intended as an aid in the diagnosis of influenza from Nasopharyngeal swab specimens and should not be used as a sole basis for treatment. Nasal washings and aspirates are unacceptable for Xpert Xpress  SARS-CoV-2/FLU/RSV testing.  Fact Sheet for Patients: bloggercourse.com  Fact Sheet for Healthcare Providers: seriousbroker.it  This test is not yet approved or cleared by the [place]  FDA and has been authorized for detection and/or diagnosis of SARS-CoV-2 by FDA under an Emergency Use Authorization (EUA). This EUA will remain in effect (meaning this test can be used) for the duration of the COVID-19 declaration under Section 564(b)(1) of the Act, 21 U.S.C. section 360bbb-3(b)(1), unless the authorization is terminated or revoked.     Resp Syncytial Virus by PCR NEGATIVE NEGATIVE Final    Comment: (NOTE) Fact Sheet for Patients: bloggercourse.com  Fact Sheet for Healthcare Providers: seriousbroker.it  This test is not yet approved or cleared by the [place]  FDA and has been authorized for detection and/or diagnosis of SARS-CoV-2 by FDA under an Emergency Use Authorization (EUA). This EUA will remain in effect (meaning this test can be used) for the duration of the COVID-19 declaration under Section 564(b)(1) of the Act, 21 U.S.C. section 360bbb-3(b)(1), unless the authorization is terminated or revoked.  Performed at [facility], [address] [place], [place] [address]   Culture, blood (Routine X 2) w Reflex to ID Panel     Status: None   Collection Time: 04/25/22  3:32 AM   Specimen: BLOOD  Result Value Ref Range Status   Specimen Description BLOOD BLOOD RIGHT ARM  Final   Special Requests   Final    BOTTLES DRAWN AEROBIC AND ANAEROBIC Blood Culture adequate volume   Culture   Final    NO GROWTH 5 DAYS Performed at [facility], [address], [place], [place] [address]    Report Status 04/30/2022 FINAL  Final  Culture, blood (Routine X 2) w Reflex to ID Panel     Status: None   Collection Time: 04/25/22  3:32 AM   Specimen: BLOOD  Result  Value Ref Range Status   Specimen Description BLOOD BLOOD RIGHT ARM  Final   Special Requests   Final    BOTTLES DRAWN AEROBIC AND ANAEROBIC Blood Culture adequate volume   Culture   Final    NO GROWTH 5 DAYS Performed at [facility], [address], [place], [place] [address]    Report Status 04/30/2022  FINAL  Final    Labs: CBC: Recent Labs  Lab 01/25/24 2108 01/26/24 0723 01/26/24 2013 01/27/24 0301  WBC 8.4 7.4  --  8.2  NEUTROABS 5.9  --   --   --   HGB 6.6* 5.9* 8.2* 7.9*  HCT 19.3* 17.3* 23.8* 22.7*  MCV 93.7 93.5  --  90.4  PLT 257 206  --  210   Basic Metabolic Panel: Recent Labs  Lab 01/25/24 2108 01/26/24 0723 01/27/24 0301  NA 135 133* 133*  K 5.0 5.9* 3.8  CL 92* 94* 92*  CO2 25 23 26   GLUCOSE 250* 182* 139*  BUN 52* 57* 25*  CREATININE 8.44* 9.00* 4.66*  CALCIUM  9.8 9.1 8.9  MG  --  1.7  --   PHOS  --  4.4 3.4   Liver Function Tests: Recent Labs  Lab 01/27/24 0301  ALBUMIN  3.4*   CBG: No results for input(s): GLUCAP in the last 168 hours.  Discharge time spent: 34 minutes  Signed: Muzammil Bruins Al-Sultani, MD [facility] 01/27/2024

## 2024-01-27 NOTE — Progress Notes (Signed)
 Patient ID: [name] [name] Na, male   DOB: 1961/04/01, 63 y.o.   MRN: [id number] [facility] Progress Note   Assessment/ Plan:   1.  Acute on chronic anemia: With an episode of overt blood loss associated with dialysis circuit superimposed on previously downtrending hemoglobin/hematocrit.  No evidence of obvious GI loss and with prior history of urological surgeries/neobladder. 2. ESRD: Underwent hemodialysis yesterday with ultrafiltration of 3 L.  To resume home hemodialysis per outpatient schedule. 3. CKD-MBD: Calcium  and phosphorus both at goal, resume binders and calcitriol for phosphorus/PTH control respectively. 4. Nutrition: Continue renal diet/fluid restriction and ongoing protein supplementation.  Reminded about potassium restriction. 5. Hypertension: Blood pressure marginally elevated, monitor with ongoing hemodialysis/UF.  Subjective:   Reports to be feeling better informs me that he is going to be discharged today.   Objective:   BP (!) 147/68 (BP Location: Right Arm)   Pulse 99   Temp 98.6 F (37 C) (Oral)   Resp 16   Ht 5' 10 (1.778 m)   Wt 91.4 kg   SpO2 98%   BMI 28.90 kg/m   Physical Exam: Gen: Comfortably sitting up on the side of his bed CVS: Pulse regular rhythm, normal rate, S1 and S2 normal Resp: Clear to auscultation bilaterally, no rales/rhonchi Abd: Soft, flat, nontender, bowel sounds normal Ext: No lower extremity edema.  Intact dressings left radiocephalic fistula  Labs: BMET Recent Labs  Lab 01/25/24 2108 01/26/24 0723 01/27/24 0301  NA 135 133* 133*  K 5.0 5.9* 3.8  CL 92* 94* 92*  CO2 25 23 26   GLUCOSE 250* 182* 139*  BUN 52* 57* 25*  CREATININE 8.44* 9.00* 4.66*  CALCIUM  9.8 9.1 8.9  PHOS  --  4.4 3.4   CBC Recent Labs  Lab 01/25/24 2108 01/26/24 0723 01/26/24 2013 01/27/24 0301  WBC 8.4 7.4  --  8.2  NEUTROABS 5.9  --   --   --   HGB 6.6* 5.9* 8.2* 7.9*  HCT 19.3* 17.3* 23.8* 22.7*  MCV 93.7 93.5  --  90.4   PLT 257 206  --  210      Medications:     sodium chloride    Intravenous Once   amLODipine   10 mg Oral Once per day on Sunday Tuesday Thursday Saturday   [START ON 01/28/2024] calcitRIOL  1 mcg Oral Q T,Th,Sa-HD   Chlorhexidine  Gluconate Cloth  6 each Topical Q0600   cyanocobalamin   1,000 mcg Intramuscular Daily   [START ON 01/29/2024] vitamin B-12  1,000 mcg Oral Daily   pantoprazole   40 mg Oral BID   sevelamer  carbonate  800 mg Oral TID WC   sodium chloride  flush  3 mL Intravenous Q12H   venlafaxine  XR  150 mg Oral QHS    Gordy Blanch, MD 01/27/2024, 9:34 AM

## 2024-01-28 LAB — HEPATITIS B SURFACE ANTIBODY, QUANTITATIVE: Hep B S AB Quant (Post): 19.3 m[IU]/mL

## 2024-01-28 NOTE — TOC Transition Note (Signed)
 Transition of Care - Initial Contact after Hospitalization  Date of discharge: 01/27/2024  Date of contact: 01/28/24  Method: Attempted Phone Call Spoke to: No Answer  Patient contacted to discuss transition of care from recent inpatient hospitalization but he didn't answer the phone. Left a voicemail advising her reach back out to the [facility] HD unit [phone] for any questions or concerns.   Patient to continue HHD and his primary Nephrologist will see him during routine HHD rounds at [facility].  Charmaine Piety, NP

## 2024-01-30 DIAGNOSIS — N2581 Secondary hyperparathyroidism of renal origin: Secondary | ICD-10-CM | POA: Insufficient documentation

## 2024-01-30 DIAGNOSIS — D631 Anemia in chronic kidney disease: Secondary | ICD-10-CM | POA: Insufficient documentation

## 2024-01-30 DIAGNOSIS — E119 Type 2 diabetes mellitus without complications: Secondary | ICD-10-CM

## 2024-01-30 DIAGNOSIS — D62 Acute posthemorrhagic anemia: Secondary | ICD-10-CM | POA: Insufficient documentation

## 2024-02-23 ENCOUNTER — Emergency Department (HOSPITAL_COMMUNITY)
Admission: EM | Admit: 2024-02-23 | Discharge: 2024-02-24 | Disposition: A | Discharge status: Home / Self Care | Attending: Emergency Medicine | Admitting: Emergency Medicine

## 2024-02-23 ENCOUNTER — Encounter (HOSPITAL_COMMUNITY): Payer: Self-pay

## 2024-02-23 ENCOUNTER — Other Ambulatory Visit: Payer: Self-pay

## 2024-02-23 DIAGNOSIS — R55 Syncope and collapse: Secondary | ICD-10-CM | POA: Insufficient documentation

## 2024-02-23 DIAGNOSIS — N186 End stage renal disease: Secondary | ICD-10-CM | POA: Insufficient documentation

## 2024-02-23 DIAGNOSIS — R42 Dizziness and giddiness: Secondary | ICD-10-CM | POA: Insufficient documentation

## 2024-02-23 DIAGNOSIS — Z992 Dependence on renal dialysis: Secondary | ICD-10-CM | POA: Insufficient documentation

## 2024-02-23 DIAGNOSIS — Z7982 Long term (current) use of aspirin: Secondary | ICD-10-CM | POA: Diagnosis not present

## 2024-02-23 LAB — CBC
HCT: 36.4 % — ABNORMAL LOW (ref 39.0–52.0)
Hemoglobin: 12.4 g/dL — ABNORMAL LOW (ref 13.0–17.0)
MCH: 32.3 pg (ref 26.0–34.0)
MCHC: 34.1 g/dL (ref 30.0–36.0)
MCV: 94.8 fL (ref 80.0–100.0)
Platelets: 290 K/uL (ref 150–400)
RBC: 3.84 MIL/uL — ABNORMAL LOW (ref 4.22–5.81)
RDW: 13.5 % (ref 11.5–15.5)
WBC: 10.3 K/uL (ref 4.0–10.5)
nRBC: 0 % (ref 0.0–0.2)

## 2024-02-23 LAB — CBG MONITORING, ED: Glucose-Capillary: 258 mg/dL — ABNORMAL HIGH (ref 70–99)

## 2024-02-23 NOTE — ED Triage Notes (Signed)
 Pt to ED via [facility] from home c/o near syncopal episode. Pt does home hemodialysis, after completing treatment., pt felt dizzy/ pale/diaphoretic.   Hx anemia. Pt noted to be orthostatic with EMS.  Last VS:112/54 bp, hr96, 100%RA, CBG 200   No medications given by EMS.

## 2024-02-24 LAB — COMPREHENSIVE METABOLIC PANEL WITH GFR
ALT: 22 U/L (ref 0–44)
AST: 24 U/L (ref 15–41)
Albumin: 4.3 g/dL (ref 3.5–5.0)
Alkaline Phosphatase: 108 U/L (ref 38–126)
Anion gap: 20 — ABNORMAL HIGH (ref 5–15)
BUN: 34 mg/dL — ABNORMAL HIGH (ref 8–23)
CO2: 23 mmol/L (ref 22–32)
Calcium: 10 mg/dL (ref 8.9–10.3)
Chloride: 88 mmol/L — ABNORMAL LOW (ref 98–111)
Creatinine, Ser: 5.38 mg/dL — ABNORMAL HIGH (ref 0.61–1.24)
GFR, Estimated: 11 mL/min — ABNORMAL LOW (ref >60)
Glucose, Bld: 263 mg/dL — ABNORMAL HIGH (ref 70–99)
Potassium: 4.2 mmol/L (ref 3.5–5.1)
Sodium: 131 mmol/L — ABNORMAL LOW (ref 135–145)
Total Bilirubin: 0.4 mg/dL (ref 0.0–1.2)

## 2024-02-24 NOTE — ED Notes (Addendum)
 [name] [name]

## 2024-02-24 NOTE — ED Notes (Signed)
 Pt states he was throwing up about 2 or 3 times and noticed his SBP dropped to the the 70's when he stood up.   Pt states his SBP is normally around 100.

## 2024-02-24 NOTE — ED Notes (Signed)
 RN will alert NT to bladder scan pt before I/O  Pt states he may be dry from completing is dialysis.

## 2024-02-24 NOTE — ED Provider Notes (Signed)
 [place] EMERGENCY DEPARTMENT AT [facility] Provider Note   CSN: [id number] Arrival date & time: 02/23/24  2304     Patient presents with: Near Syncope   [name] [name] is a 63 y.o. male.   The history is provided by the patient and the spouse.  Patient with extensive history including end-stage renal disease on home hemodialysis presents with a near syncopal episode patient reports he was finishing up a session at home when he started becoming sweaty and had nonbloody emesis.  He reports feeling lightheaded and his wife reports he was awake but not talking for about 30 seconds.  No falls.  No head trauma.  No seizures.  No chest pain or shortness of breath.  He was otherwise at his baseline.  Initial blood pressures were measured in the 70s when he stood up.  He is now feeling improved No new medications are reported     Prior to Admission medications   Medication Sig Start Date End Date Taking? Authorizing Provider  amLODipine  (NORVASC ) 10 MG tablet Take 1 tablet (10 mg total) by mouth at bedtime. Do not take on days of dialysis unless BP > 120 mm Hg 06/22/22   Ladona Heinz, MD  aspirin  81 MG chewable tablet Chew 1 tablet (81 mg total) by mouth daily. 04/26/22   Singh, Prashant K, MD  cyanocobalamin  1000 MCG tablet Take 1 tablet (1,000 mcg total) by mouth daily. 01/29/24 02/28/24  Al-Sultani, Anmar, MD  metoprolol  tartrate (LOPRESSOR ) 25 MG tablet Take 1 tablet (25 mg total) by mouth 2 (two) times daily. Hold on the days of dialysis 08/08/23   Ladona Heinz, MD  pantoprazole  (PROTONIX ) 40 MG tablet Take 80 mg by mouth daily.    [provider]  repaglinide (PRANDIN) 0.5 MG tablet Take by mouth. TAKE 1 TABLET BY MOUTH 3 TIMES DAILY BEFORE MEALS. SKIP DOSE IF NOT EATING. FOR DIABETES    [provider]  rosuvastatin  (CRESTOR ) 20 MG tablet Take 1 tablet (20 mg total) by mouth daily. Please call office to schedule an appt for further refills. Thank you 07/04/23    Ladona Heinz, MD  sevelamer  carbonate (RENVELA ) 800 MG tablet Take 800-1,600 mg by mouth See admin instructions. 1600 mg with meals, 800 mg with snacks. 07/27/21   [provider]  venlafaxine  XR (EFFEXOR -XR) 150 MG 24 hr capsule Take 1 capsule (150 mg total) by mouth daily with breakfast. Patient taking differently: Take 150 mg by mouth at bedtime. 07/06/17   Eappen, Saramma, MD  QUEtiapine  (SEROQUEL ) 25 MG tablet Take 1 tablet (25 mg total) by mouth at bedtime. Patient not taking: Reported on 05/04/2018 07/06/17 05/27/19  Eappen, Saramma, MD    Allergies: Lisinopril and Lisinopril    Review of Systems  Constitutional:  Negative for fever.  Respiratory:  Negative for shortness of breath.   Cardiovascular:  Negative for chest pain.  Gastrointestinal:  Positive for nausea and vomiting. Negative for abdominal pain and blood in stool.  Neurological:  Positive for light-headedness.    Updated Vital Signs BP 115/67   Pulse 85   Temp (!) 97.5 F (36.4 C)   Resp 16   Ht 1.778 m (5' 10)   Wt 90.7 kg   SpO2 100%   BMI 28.70 kg/m   Physical Exam CONSTITUTIONAL: Well developed/well nourished no distress HEAD: Normocephalic/atraumatic EYES: EOMI/PERRL ENMT: Mucous membranes moist NECK: supple no meningeal signs SPINE/BACK:entire spine nontender CV: S1/S2 noted, no murmurs/rubs/gallops noted LUNGS: Lungs are clear to  auscultation bilaterally, no apparent distress ABDOMEN: soft, nontender, no rebound or guarding, bowel sounds noted throughout abdomen GU:no cva tenderness NEURO: Pt is awake/alert/appropriate, moves all extremitiesx4.  No facial droop.  No arm or leg drift EXTREMITIES: pulses normal/equal,x4 full ROM, dialysis access to left arm with thrill noted SKIN: warm, color normal   (all labs ordered are listed, but only abnormal results are displayed) Labs Reviewed  COMPREHENSIVE METABOLIC PANEL WITH GFR - Abnormal; Notable for the following components:      Result Value    Sodium 131 (*)    Chloride 88 (*)    Glucose, Bld 263 (*)    BUN 34 (*)    Creatinine, Ser 5.38 (*)    GFR, Estimated 11 (*)    Anion gap 20 (*)    All other components within normal limits  CBC - Abnormal; Notable for the following components:   RBC 3.84 (*)    Hemoglobin 12.4 (*)    HCT 36.4 (*)    All other components within normal limits  CBG MONITORING, ED - Abnormal; Notable for the following components:   Glucose-Capillary 258 (*)    All other components within normal limits    EKG: EKG Interpretation Date/Time:  Thursday February 23 2024 23:14:45 EST Ventricular Rate:  96 PR Interval:  170 QRS Duration:  104 QT Interval:  378 QTC Calculation: 477 R Axis:   71  Text Interpretation: Normal sinus rhythm Inferior infarct , age undetermined Abnormal ECG Confirmed by Midge Golas ([id number]) on 02/23/2024 11:47:16 PM  Radiology: No results found.   Procedures   Medications Ordered in the ED - No data to display  Clinical Course as of 02/24/24 0148  Fri Feb 24, 2024  0147 Patient with extensive history including ESRD on home dialysis presents after near syncopal episode.  This occurred at the end of a session, it was diaphoretic, vomiting and lightheaded and pressures were in the 70s upon standing.  He is now back to baseline.  His blood pressure has recovered and is no tachycardia. He has been ambulatory.  He is drinking Coca-Cola and feels well  Monitoring in the ER does not reveal any cardiac arrhythmia.  Labs are overall reassuring.  He will dialyze again on November 22.  Advised to monitor all of his symptoms and if he has any further issues he needs to talk to his nephrologist. [DW]    Clinical Course User Index [DW] Midge Golas, MD                                 Medical Decision Making Amount and/or Complexity of Data Reviewed Labs: ordered.   This patient presents to the ED for concern of near syncope, this involves an extensive number of  treatment options, and is a complaint that carries with it a high risk of complications and morbidity.  The differential diagnosis includes but is not limited to orthostatic hypotension, cardiac arrhythmia, seizures, pulmonary embolism, aortic dissection, AAA, electrolyte derangement, acute anemia  Comorbidities that complicate the patient evaluation: Patient's presentation is complicated by their history of ESRD  Additional history obtained: Additional history obtained from spouse Records reviewed previous admission documents  Lab Tests: I Ordered, and personally interpreted labs.  The pertinent results include: ESRD, improved anemia, hyperglycemia without anion gap  Cardiac Monitoring: The patient was maintained on a cardiac monitor.  I personally viewed and interpreted the cardiac monitor which showed an  underlying rhythm of:  sinus rhythm  Test Considered: Given patient's overall clinical appearance and improvement, will defer further testing or admission at this time  Reevaluation: After the interventions noted above, I reevaluated the patient and found that they have :improved  Complexity of problems addressed: Patient's presentation is most consistent with  acute presentation with potential threat to life or bodily function  Disposition: After consideration of the diagnostic results and the patient's response to treatment,  I feel that the patent would benefit from discharge  .        Final diagnoses:  Near syncope  ESRD (end stage renal disease) [facility]    ED Discharge Orders     None          Midge Golas, MD 02/24/24 [phone]
